# Patient Record
Sex: Male | Born: 1965 | Hispanic: Yes | Marital: Married | Smoking: Never smoker
Health system: Southern US, Community
[De-identification: ages and names within clinical notes are randomized; demographics above are authoritative.]

## PROBLEM LIST (undated history)

## (undated) DIAGNOSIS — E538 Deficiency of other specified B group vitamins: Secondary | ICD-10-CM

## (undated) DIAGNOSIS — E039 Hypothyroidism, unspecified: Secondary | ICD-10-CM

## (undated) DIAGNOSIS — F10939 Alcohol use, unspecified with withdrawal, unspecified: Secondary | ICD-10-CM

## (undated) DIAGNOSIS — I10 Essential (primary) hypertension: Secondary | ICD-10-CM

## (undated) DIAGNOSIS — I639 Cerebral infarction, unspecified: Secondary | ICD-10-CM

## (undated) DIAGNOSIS — F10239 Alcohol dependence with withdrawal, unspecified: Secondary | ICD-10-CM

## (undated) DIAGNOSIS — G8929 Other chronic pain: Secondary | ICD-10-CM

## (undated) DIAGNOSIS — I8390 Asymptomatic varicose veins of unspecified lower extremity: Secondary | ICD-10-CM

## (undated) HISTORY — PX: CHOLECYSTECTOMY: SHX55

## (undated) HISTORY — PX: LEG SURGERY: SHX1003

## (undated) NOTE — *Deleted (*Deleted)
D- Patient alert and oriented. Pt affect/mood is worried and sad. Pt denies SI, HI, AVH, and pain. Pt received a PRN today for complaints of anxiety. Pt also received a PRN dor loose stools. They helped. He is also upset that his family is not returning his phone calls.   A- Scheduled medications administered to patient, per MD orders. Support and encouragement provided.  Routine safety checks conducted every 15 minutes.  Patient informed to notify staff with problems or concerns.  R- No adverse drug reactions noted. Patient contracts for safety at this time. Patient compliant with medications and treatment plan. Patient receptive, calm, and cooperative. Patient interacts well with others on the unit.  Patient remains safe at this time.  Megan Snider RN 

---

## 2007-09-19 ENCOUNTER — Ambulatory Visit: Payer: Self-pay | Admitting: General Practice

## 2013-06-05 ENCOUNTER — Ambulatory Visit: Payer: Self-pay

## 2014-08-11 ENCOUNTER — Inpatient Hospital Stay: Payer: Self-pay | Admitting: Internal Medicine

## 2014-08-11 LAB — URINALYSIS, COMPLETE
Bacteria: NONE SEEN
Blood: NEGATIVE
Glucose,UR: >=500 mg/dL (ref 0–75)
Hyaline Cast: 71
Leukocyte Esterase: NEGATIVE
Nitrite: NEGATIVE
Ph: 7 (ref 4.5–8.0)
Protein: 100
RBC,UR: 2 /HPF (ref 0–5)
Specific Gravity: 1.027 (ref 1.003–1.030)
Squamous Epithelial: 1
WBC UR: 6 /HPF (ref 0–5)

## 2014-08-11 LAB — COMPREHENSIVE METABOLIC PANEL
Albumin: 4.4 g/dL (ref 3.4–5.0)
Alkaline Phosphatase: 59 U/L
Anion Gap: 7 (ref 7–16)
BUN: 13 mg/dL (ref 7–18)
Bilirubin,Total: 1.4 mg/dL — ABNORMAL HIGH (ref 0.2–1.0)
Calcium, Total: 9.2 mg/dL (ref 8.5–10.1)
Chloride: 98 mmol/L (ref 98–107)
Co2: 29 mmol/L (ref 21–32)
Creatinine: 0.98 mg/dL (ref 0.60–1.30)
EGFR (African American): >60
EGFR (Non-African Amer.): >60
Glucose: 178 mg/dL — ABNORMAL HIGH (ref 65–99)
Osmolality: 273 (ref 275–301)
Potassium: 4 mmol/L (ref 3.5–5.1)
SGOT(AST): 67 U/L — ABNORMAL HIGH (ref 15–37)
SGPT (ALT): 49 U/L
Sodium: 134 mmol/L — ABNORMAL LOW (ref 136–145)
Total Protein: 9 g/dL — ABNORMAL HIGH (ref 6.4–8.2)

## 2014-08-11 LAB — CBC
HCT: 42.5 % (ref 40.0–52.0)
HGB: 14 g/dL (ref 13.0–18.0)
MCH: 32 pg (ref 26.0–34.0)
MCHC: 32.9 g/dL (ref 32.0–36.0)
MCV: 97 fL (ref 80–100)
Platelet: 185 10*3/uL (ref 150–440)
RBC: 4.37 10*6/uL — ABNORMAL LOW (ref 4.40–5.90)
RDW: 14.4 % (ref 11.5–14.5)
WBC: 8.8 10*3/uL (ref 3.8–10.6)

## 2014-08-11 LAB — DRUG SCREEN, URINE

## 2014-08-11 LAB — D-DIMER(ARMC): D-Dimer: 287 ng/ml

## 2014-08-11 LAB — TSH: Thyroid Stimulating Horm: 26.4 u[IU]/mL — ABNORMAL HIGH

## 2014-08-11 LAB — ETHANOL: Ethanol: <3 mg/dL

## 2014-08-11 LAB — SALICYLATE LEVEL: Salicylates, Serum: <1.7 mg/dL

## 2014-08-11 LAB — T4, FREE: Free Thyroxine: 0.63 ng/dL — ABNORMAL LOW (ref 0.76–1.46)

## 2014-08-11 LAB — ACETAMINOPHEN LEVEL: Acetaminophen: <2 ug/mL

## 2014-08-12 LAB — CBC WITH DIFFERENTIAL/PLATELET
Basophil #: 0 10*3/uL (ref 0.0–0.1)
Basophil %: 0.7 %
Eosinophil #: 0.1 10*3/uL (ref 0.0–0.7)
Eosinophil %: 1.6 %
HCT: 37.8 % — ABNORMAL LOW (ref 40.0–52.0)
HGB: 12.4 g/dL — ABNORMAL LOW (ref 13.0–18.0)
Lymphocyte #: 0.6 10*3/uL — ABNORMAL LOW (ref 1.0–3.6)
Lymphocyte %: 10.4 %
MCH: 32 pg (ref 26.0–34.0)
MCHC: 32.8 g/dL (ref 32.0–36.0)
MCV: 98 fL (ref 80–100)
Monocyte #: 0.5 x10 3/mm (ref 0.2–1.0)
Monocyte %: 7.9 %
Neutrophil #: 4.6 10*3/uL (ref 1.4–6.5)
Neutrophil %: 79.4 %
Platelet: 159 10*3/uL (ref 150–440)
RBC: 3.87 10*6/uL — ABNORMAL LOW (ref 4.40–5.90)
RDW: 14.4 % (ref 11.5–14.5)
WBC: 5.8 10*3/uL (ref 3.8–10.6)

## 2014-08-12 LAB — COMPREHENSIVE METABOLIC PANEL
Albumin: 3.5 g/dL (ref 3.4–5.0)
Alkaline Phosphatase: 47 U/L
Anion Gap: 8 (ref 7–16)
BUN: 8 mg/dL (ref 7–18)
Bilirubin,Total: 1.6 mg/dL — ABNORMAL HIGH (ref 0.2–1.0)
Calcium, Total: 8.3 mg/dL — ABNORMAL LOW (ref 8.5–10.1)
Chloride: 106 mmol/L (ref 98–107)
Co2: 24 mmol/L (ref 21–32)
Creatinine: 0.55 mg/dL — ABNORMAL LOW (ref 0.60–1.30)
EGFR (African American): >60
EGFR (Non-African Amer.): >60
Glucose: 108 mg/dL — ABNORMAL HIGH (ref 65–99)
Osmolality: 275 (ref 275–301)
Potassium: 3.7 mmol/L (ref 3.5–5.1)
SGOT(AST): 51 U/L — ABNORMAL HIGH (ref 15–37)
SGPT (ALT): 40 U/L
Sodium: 138 mmol/L (ref 136–145)
Total Protein: 7.4 g/dL (ref 6.4–8.2)

## 2014-08-12 LAB — MAGNESIUM: Magnesium: 2.1 mg/dL

## 2014-08-13 LAB — COMPREHENSIVE METABOLIC PANEL
Albumin: 3.1 g/dL — ABNORMAL LOW (ref 3.4–5.0)
Alkaline Phosphatase: 44 U/L — ABNORMAL LOW
Anion Gap: 13 (ref 7–16)
BUN: 7 mg/dL (ref 7–18)
Bilirubin,Total: 1.1 mg/dL — ABNORMAL HIGH (ref 0.2–1.0)
Calcium, Total: 8 mg/dL — ABNORMAL LOW (ref 8.5–10.1)
Chloride: 104 mmol/L (ref 98–107)
Co2: 20 mmol/L — ABNORMAL LOW (ref 21–32)
Creatinine: 0.74 mg/dL (ref 0.60–1.30)
EGFR (African American): >60
EGFR (Non-African Amer.): >60
Glucose: 92 mg/dL (ref 65–99)
Osmolality: 271 (ref 275–301)
Potassium: 3.2 mmol/L — ABNORMAL LOW (ref 3.5–5.1)
SGOT(AST): 39 U/L — ABNORMAL HIGH (ref 15–37)
SGPT (ALT): 34 U/L
Sodium: 137 mmol/L (ref 136–145)
Total Protein: 7.1 g/dL (ref 6.4–8.2)

## 2014-08-13 LAB — CBC WITH DIFFERENTIAL/PLATELET
Basophil #: 0.1 10*3/uL (ref 0.0–0.1)
Basophil %: 0.7 %
Eosinophil #: 0 10*3/uL (ref 0.0–0.7)
Eosinophil %: 0.1 %
HCT: 38.4 % — ABNORMAL LOW (ref 40.0–52.0)
HGB: 12.7 g/dL — ABNORMAL LOW (ref 13.0–18.0)
Lymphocyte #: 0.7 10*3/uL — ABNORMAL LOW (ref 1.0–3.6)
Lymphocyte %: 9.1 %
MCH: 31.9 pg (ref 26.0–34.0)
MCHC: 33.1 g/dL (ref 32.0–36.0)
MCV: 96 fL (ref 80–100)
Monocyte #: 0.4 x10 3/mm (ref 0.2–1.0)
Monocyte %: 5.4 %
Neutrophil #: 6.8 10*3/uL — ABNORMAL HIGH (ref 1.4–6.5)
Neutrophil %: 84.7 %
Platelet: 173 10*3/uL (ref 150–440)
RBC: 3.99 10*6/uL — ABNORMAL LOW (ref 4.40–5.90)
RDW: 14 % (ref 11.5–14.5)
WBC: 8.1 10*3/uL (ref 3.8–10.6)

## 2014-08-14 LAB — BASIC METABOLIC PANEL
Anion Gap: 10 (ref 7–16)
BUN: 8 mg/dL (ref 7–18)
Calcium, Total: 8 mg/dL — ABNORMAL LOW (ref 8.5–10.1)
Chloride: 108 mmol/L — ABNORMAL HIGH (ref 98–107)
Co2: 21 mmol/L (ref 21–32)
Creatinine: 0.7 mg/dL (ref 0.60–1.30)
EGFR (African American): >60
EGFR (Non-African Amer.): >60
Glucose: 89 mg/dL (ref 65–99)
Osmolality: 275 (ref 275–301)
Potassium: 3.2 mmol/L — ABNORMAL LOW (ref 3.5–5.1)
Sodium: 139 mmol/L (ref 136–145)

## 2014-08-14 LAB — CBC WITH DIFFERENTIAL/PLATELET
Basophil #: 0 10*3/uL (ref 0.0–0.1)
Basophil %: 0.5 %
Eosinophil #: 0 10*3/uL (ref 0.0–0.7)
Eosinophil %: 0.1 %
HCT: 35 % — ABNORMAL LOW (ref 40.0–52.0)
HGB: 11.7 g/dL — ABNORMAL LOW (ref 13.0–18.0)
Lymphocyte #: 1.2 10*3/uL (ref 1.0–3.6)
Lymphocyte %: 13.5 %
MCH: 32.5 pg (ref 26.0–34.0)
MCHC: 33.5 g/dL (ref 32.0–36.0)
MCV: 97 fL (ref 80–100)
Monocyte #: 0.9 x10 3/mm (ref 0.2–1.0)
Monocyte %: 9.8 %
Neutrophil #: 7 10*3/uL — ABNORMAL HIGH (ref 1.4–6.5)
Neutrophil %: 76.1 %
Platelet: 158 10*3/uL (ref 150–440)
RBC: 3.61 10*6/uL — ABNORMAL LOW (ref 4.40–5.90)
RDW: 14.1 % (ref 11.5–14.5)
WBC: 9.2 10*3/uL (ref 3.8–10.6)

## 2014-08-14 LAB — HEPATIC FUNCTION PANEL A (ARMC)
Albumin: 2.6 g/dL — ABNORMAL LOW (ref 3.4–5.0)
Alkaline Phosphatase: 37 U/L — ABNORMAL LOW
Bilirubin, Direct: 0.2 mg/dL (ref 0.0–0.2)
Bilirubin,Total: 0.9 mg/dL (ref 0.2–1.0)
SGOT(AST): 25 U/L (ref 15–37)
SGPT (ALT): 25 U/L
Total Protein: 6.4 g/dL (ref 6.4–8.2)

## 2014-08-15 LAB — CBC WITH DIFFERENTIAL/PLATELET
Basophil #: 0 10*3/uL (ref 0.0–0.1)
Basophil %: 0.4 %
Eosinophil #: 0 10*3/uL (ref 0.0–0.7)
Eosinophil %: 0.4 %
HCT: 34.1 % — ABNORMAL LOW (ref 40.0–52.0)
HGB: 11.3 g/dL — ABNORMAL LOW (ref 13.0–18.0)
Lymphocyte #: 0.9 10*3/uL — ABNORMAL LOW (ref 1.0–3.6)
Lymphocyte %: 12.7 %
MCH: 32.3 pg (ref 26.0–34.0)
MCHC: 33.2 g/dL (ref 32.0–36.0)
MCV: 97 fL (ref 80–100)
Monocyte #: 1.1 x10 3/mm — ABNORMAL HIGH (ref 0.2–1.0)
Monocyte %: 14.7 %
Neutrophil #: 5.2 10*3/uL (ref 1.4–6.5)
Neutrophil %: 71.8 %
Platelet: 204 10*3/uL (ref 150–440)
RBC: 3.5 10*6/uL — ABNORMAL LOW (ref 4.40–5.90)
RDW: 14.4 % (ref 11.5–14.5)
WBC: 7.2 10*3/uL (ref 3.8–10.6)

## 2014-08-15 LAB — BASIC METABOLIC PANEL
Anion Gap: 10 (ref 7–16)
BUN: 4 mg/dL — ABNORMAL LOW (ref 7–18)
Calcium, Total: 8.5 mg/dL (ref 8.5–10.1)
Chloride: 107 mmol/L (ref 98–107)
Co2: 24 mmol/L (ref 21–32)
Creatinine: 0.64 mg/dL (ref 0.60–1.30)
EGFR (African American): >60
EGFR (Non-African Amer.): >60
Glucose: 95 mg/dL (ref 65–99)
Osmolality: 278 (ref 275–301)
Potassium: 3.3 mmol/L — ABNORMAL LOW (ref 3.5–5.1)
Sodium: 141 mmol/L (ref 136–145)

## 2014-08-16 LAB — CBC WITH DIFFERENTIAL/PLATELET
Basophil #: 0 10*3/uL (ref 0.0–0.1)
Basophil %: 0.8 %
Eosinophil #: 0.1 10*3/uL (ref 0.0–0.7)
Eosinophil %: 0.9 %
HCT: 32.8 % — ABNORMAL LOW (ref 40.0–52.0)
HGB: 10.9 g/dL — ABNORMAL LOW (ref 13.0–18.0)
Lymphocyte #: 1 10*3/uL (ref 1.0–3.6)
Lymphocyte %: 17.6 %
MCH: 32.4 pg (ref 26.0–34.0)
MCHC: 33.3 g/dL (ref 32.0–36.0)
MCV: 97 fL (ref 80–100)
Monocyte #: 1.1 x10 3/mm — ABNORMAL HIGH (ref 0.2–1.0)
Monocyte %: 19.2 %
Neutrophil #: 3.5 10*3/uL (ref 1.4–6.5)
Neutrophil %: 61.5 %
Platelet: 248 10*3/uL (ref 150–440)
RBC: 3.37 10*6/uL — ABNORMAL LOW (ref 4.40–5.90)
RDW: 14 % (ref 11.5–14.5)
WBC: 5.7 10*3/uL (ref 3.8–10.6)

## 2014-08-16 LAB — BASIC METABOLIC PANEL
Anion Gap: 8 (ref 7–16)
BUN: 3 mg/dL — ABNORMAL LOW (ref 7–18)
Calcium, Total: 8.9 mg/dL (ref 8.5–10.1)
Chloride: 107 mmol/L (ref 98–107)
Co2: 26 mmol/L (ref 21–32)
Creatinine: 0.69 mg/dL (ref 0.60–1.30)
EGFR (African American): >60
EGFR (Non-African Amer.): >60
Glucose: 98 mg/dL (ref 65–99)
Osmolality: 278 (ref 275–301)
Potassium: 3 mmol/L — ABNORMAL LOW (ref 3.5–5.1)
Sodium: 141 mmol/L (ref 136–145)

## 2014-08-16 LAB — MAGNESIUM: Magnesium: 2.2 mg/dL

## 2014-08-17 LAB — CBC WITH DIFFERENTIAL/PLATELET
Basophil #: 0.1 10*3/uL (ref 0.0–0.1)
Basophil %: 1.2 %
Eosinophil #: 0.1 10*3/uL (ref 0.0–0.7)
Eosinophil %: 1.4 %
HCT: 33.8 % — ABNORMAL LOW (ref 40.0–52.0)
HGB: 11.2 g/dL — ABNORMAL LOW (ref 13.0–18.0)
Lymphocyte #: 1.2 10*3/uL (ref 1.0–3.6)
Lymphocyte %: 21.3 %
MCH: 31.7 pg (ref 26.0–34.0)
MCHC: 33 g/dL (ref 32.0–36.0)
MCV: 96 fL (ref 80–100)
Monocyte #: 1.1 x10 3/mm — ABNORMAL HIGH (ref 0.2–1.0)
Monocyte %: 18 %
Neutrophil #: 3.4 10*3/uL (ref 1.4–6.5)
Neutrophil %: 58.1 %
Platelet: 320 10*3/uL (ref 150–440)
RBC: 3.52 10*6/uL — ABNORMAL LOW (ref 4.40–5.90)
RDW: 14.2 % (ref 11.5–14.5)
WBC: 5.8 10*3/uL (ref 3.8–10.6)

## 2014-08-17 LAB — BASIC METABOLIC PANEL
Anion Gap: 9 (ref 7–16)
BUN: 3 mg/dL — ABNORMAL LOW (ref 7–18)
Calcium, Total: 9 mg/dL (ref 8.5–10.1)
Chloride: 106 mmol/L (ref 98–107)
Co2: 26 mmol/L (ref 21–32)
Creatinine: 0.69 mg/dL (ref 0.60–1.30)
EGFR (African American): >60
EGFR (Non-African Amer.): >60
Glucose: 89 mg/dL (ref 65–99)
Osmolality: 277 (ref 275–301)
Potassium: 3.6 mmol/L (ref 3.5–5.1)
Sodium: 141 mmol/L (ref 136–145)

## 2014-11-21 NOTE — H&P (Signed)
PATIENT NAME:  Ivan Hamilton, Ivan Hamilton MR#:  [id number] DATE OF BIRTH:  10-01-1965  ADMITTING PHYSICIAN: Gladstone Lighter, MD  PRIMARY CARE PHYSICIAN: Tracie Harrier, MD  CHIEF COMPLAINT: Alcohol withdrawal.   HISTORY OF PRESENT ILLNESS: Ivan Hamilton is a 49 year old Hispanic male with past medical history significant for hypothyroidism, alcohol abuse, presents to the hospital secondary to not drinking for 4 days and having significant confusion and agitation at home. The patient is very confused and in withdrawals at this time and most of the history is obtained from wife at bedside. According to the wife, the patient drinks more than a half bottle of vodka every day. He tried to quit in the past once, but it did not help him.  He is usually afraid to get treatment for his withdrawal because he is worried that his work people would know. At work, he is very sober.  He helps in making mattresses and decided to stop drinking on Sunday, 4 days ago from now, and has been confused, but tremors getting worse, agitation getting worse over the last couple of days, so he is brought to the ER. The patient is tachycardic, hypertensive at this time. Very agitated, restless, in spite of receiving Valium, Ativan, multiple doses. So, he is being admitted for alcohol withdrawals.    PAST MEDICAL HISTORY:   1.  Hypothyroidism.  2.  Alcohol abuse.   PAST SURGICAL HISTORY: None.   ALLERGIES TO MEDICATIONS: No known drug allergies.   CURRENT HOME MEDICATIONS: Synthroid 100 mcg p.o. daily.   SOCIAL HISTORY: Lives at home with his wife. Denies smoking. Works in [employer]. Drinks more than a half bottle of vodka every day.   FAMILY HISTORY: Dad died in a homicide and mom only has thyroid issues.   REVIEW OF SYSTEMS: Unable to be obtained secondary to the patient's mental status.   PHYSICAL EXAMINATION: VITAL SIGNS: Temperature 98.9 degrees Fahrenheit, pulse 96, respirations 18, blood pressure 149/92, pulse  oximetry 98% on room air.  GENERAL: Well-built, well-nourished male in bed, very confused, trying to get out of bed.  HEENT: Normocephalic, atraumatic. Pupils equal, round, reacting to light. Anicteric sclerae. Extraocular movements intact. Oropharynx clear without erythema, mass, or exudates.  NECK: Supple. No thyromegaly, JVD, or carotid bruits. No lymphadenopathy.  LUNGS: Moving air bilaterally, no wheezes or crackles. No use of accessory muscles for breathing. CARDIOVASCULAR: S1, S2, regular rate and rhythm. No murmurs, rubs, or gallops.  ABDOMEN: Soft, nontender, nondistended. No hepatosplenomegaly. Normal bowel sounds.  EXTREMITIES: No pedal edema. No clubbing or cyanosis, normal dorsalis pedis pulses palpable bilaterally.  SKIN: No acne, rash, or lesions.  LYMPHATIC: No cervical or inguinal lymphadenopathy.  NEUROLOGIC: Unable to assess because patient not cooperating, able to mover all 4 extremities and no facial deficits noted on exam.  PSYCHOLOGICAL: He is alert, not oriented.   LABORATORY DATA: WBC is 8.8, hemoglobin 14.0, hematocrit 42.5, platelet count 185,000.  Sodium 134, potassium 4.0, chloride 98, bicarbonate 29, BUN 13, creatinine 0.98, glucose 178, calcium of 9.2.  ALT 49, AST 67, alkaline phosphatase 59, total bilirubin 1.4, albumin of 4.4. Acetaminophen level is negative.   IMAGING:  Chest x-ray showing clear lung fields. CT of the head showing no acute intracranial abnormality.  Serum ethanol level is negative. Urine toxicology screen is negative. TSH is elevated at 26.4, free T4 is low at 0.63. D-dimer is 287, within normal limits.   EKG showing normal sinus rhythm, heart rate of 88.   ASSESSMENT AND PLAN: A 49 year old  male with alcoholic dependency, stopped drinking 4 days ago, going through withdrawals.  1.  Severe alcohol withdrawal. With delirium tremens. Admit for CIWA protocol. Already received multiple doses of Ativan and Valium and not getting controlled, still  very agitated.  Will admit to critical care unit for precedex drip.  monitor intravenous fluids. Psychiatry has been consulted.  If worsening withdrawals- then will need intubation and propofol 2.  Hypothyroidism, noncompliant with his medication. Restart his Synthroid. If he cannot take by mouth, will do intravenous.  3.  Deep vein thrombosis prophylaxis.   CODE STATUS: Full code.   CRITICAL CARE TIME SPENT ON ADMISSION: 60 minutes.   ____________________________ Gladstone Lighter, MD rk:LT D: 08/11/2014 17:19:12 ET T: 08/11/2014 17:58:48 ET JOB#: [id number]  cc: Gladstone Lighter, MD, <Dictator> Gladstone Lighter MD ELECTRONICALLY SIGNED 08/12/2014 11:07
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
## 2014-11-21 NOTE — Discharge Summary (Signed)
Ivan Hamilton NAME:  Ivan Hamilton, Ivan Hamilton MR#:  [id number] DATE OF BIRTH:  1965-10-08  DIAGNOSES AT TIME OF DISCHARGE: 1.  Delirium tremens secondary to acute alcoholic withdrawal.  2.  Hypothyroidism.  3.  Generalized weakness and confusion.   HISTORY OF PRESENT ILLNESS: Ivan Hamilton is a 48 year old Hispanic male with a past medical history significant for hypothyroidism, alcohol abuse, presents to the hospital after Ivan Hamilton quit drinking approximately 4 days prior to admission and subsequently became confused and agitated. History was mainly obtained from Ivan Hamilton wife and reportedly Ivan Hamilton drinks more than 1/2 bottle of vodka every day. Ivan Hamilton tried to quit in the past on 1 occasion, but was unable to, and Ivan Hamilton reportedly had become more confused with increased tremors and agitation, and when Ivan Hamilton was brought to the ER, was noted to be tachycardic, hypertensive, agitated, restless, and received multiple doses of Valium and Ativan. Ivan Hamilton was initially admitted to the CCU and subsequently transferred to the floor. Please see H and P for full details.   PHYSICAL EXAMINATION: VITAL SIGNS: Temperature was 98.9, pulse was 96, respirations 18, blood pressure 149/92, pulse oximetry 98% on room air.  GENERAL: Ivan Hamilton was agitated and confused and trying to get out of bed.  HEENT: NCAT.  NECK: Supple. No thyromegaly.  LUNGS: Clear.  HEART: S1, S2.  ABDOMEN: Soft, nontender.  EXTREMITIES: No edema.   HOSPITAL COURSE The Ivan Hamilton was admitted to [facility] initially to the CCU and subsequently transferred to the floor. Ivan Hamilton was also seen in consultation by Dr. Weber Cooks, psychiatrist, who felt that Ivan Hamilton may have an element of PTSD. Urine toxicology screen was negative. TSH was elevated at 26.3 with a free T4 that was low at 0.63, and D-dimer 287. EKG showed normal sinus rhythm with a heart rate of 88. Liver function tests were abnormal with ALT of 49, AST of 67, alkaline phosphatase 59, total bilirubin of 1.4, and albumin of 4.4.   The Ivan Hamilton  was placed on CIWA protocol and Ivan Hamilton mental status gradually improved. Ivan Hamilton was also seen by physical therapist. Ivan Hamilton liver function tests did improve and Ivan Hamilton also underwent an ultrasound of Ivan Hamilton abdomen, which showed normal-appearing liver, no acute abnormalities identified. Sludge was noted in the gallbladder with no sonographic evidence of acute cholecystitis. The Ivan Hamilton was seen by physical therapy and was ambulated and although Ivan Hamilton continued to remain somewhat weak, was able to ambulate without assistance. We felt Ivan Hamilton would benefit from outpatient physical therapy and Ivan Hamilton was discharged in stable condition on the following medications: Ativan 1 mg p.o. b.i.d. p.r.n., levothyroxine 100 mcg once a day.   The Ivan Hamilton was strongly advised to quit drinking completely and advised to follow up with me, Dr. Ginette Pitman, in 1-2 weeks' time. The Ivan Hamilton is stable at the time of discharge.   TOTAL TIME SPENT IN DISCHARGING THE Ivan Hamilton: 35 minutes.    ____________________________ Tracie Harrier, MD vh:LT D: 08/18/2014 13:26:44 ET T: 08/18/2014 20:41:37 ET JOB#: [id number]  cc: Tracie Harrier, MD, <Dictator> Tracie Harrier MD ELECTRONICALLY SIGNED 09/15/2014 13:16

## 2014-11-21 NOTE — Consult Note (Signed)
Psychiatry: Follow-up for this patient having alcohol withdrawal delirium.  Patient seen today and also spoke with his family.  Patient states he is feeling better.  He has been able to eat liquid diet today.  Family reports that he tried to stand up but got very dizzy and weak.  Vital signs staying fairly stable.  He did still require a couple doses of Ativan earlier this afternoon.  Does not appear to be having hallucinations or active delirium. reports a small amount of abdominal pain and a little dizziness.  Mood is good denies suicidal ideation he denies hallucinations. withdrawal proceeding well.  Delirium seems under good control.  Anticipate 2 or 3 more days likely before he will have the strength to be ready for discharge.  We will work on trying to get appropriate referral in place for outpatient substance abuse treatment.  Psychoeducation completed with family and patient today.  We will continue to follow up. Alcohol withdrawal delirium, alcohol abuse  Electronic Signatures: Gonzella Lex (MD)  (Signed on 23-Jan-16 16:42)  Authored  Last Updated: 23-Jan-16 16:42 by Gonzella Lex (MD)

## 2014-11-21 NOTE — Consult Note (Signed)
Psychiatry: PAtient seen. Today family report he is more confused and nursing confirms that. Affect smiling and upbeat. PAtient confused and disoriented and hallucinating. Not violent and does have some insight and intermittant understanding of reason for hospitalization.  plan with family, patient and nursing. Just needs more ativan and supportive management. Urged family to keep lights on in the day and keep him oriented. Added po restoril 30mg  at night to help sleep. Will continue to follow. Delirium due to alcohol withdrawl  Electronic Signatures: Gonzella Lex (MD)  (Signed on 24-Jan-16 17:52)  Authored  Last Updated: 24-Jan-16 17:52 by Gonzella Lex (MD)

## 2014-11-21 NOTE — Consult Note (Signed)
Psychiatry: Follow-up for this patient having DTs.  Patient says he is feeling better today.  He is not aware of having any confusion.  Wife agrees that he is better than he has been but has still noticed episodes of confusion and disorientation during the day.  Patient has been able to get up and ambulate around the unit although he remains weak and a little off balance.  Does not complain of any current pain.  Has been able to eat much better. review of systems he is not aware of any confusion.  States his mood is better.  A little weak and dizzy but feeling more healthy. is awake neatly groomed and alert.  Appropriate interaction.  Normal speech.  Upbeat affect.  Mood stated as good.  Still little bit scattered and slow in his thinking but not to a dramatic degree. has received 1 IV dose of lorazepam earlier this day. is concerned about his being discharged too early which I agree is an appropriate concern.  On the other hand I understand that medically he is stabilizing well.  My suggestion would be that a referral be made to try to get him to inpatient rehabilitation when he leaves here.  The patient has private insurance which puts him in a different category than many of the substance abuse clients we see.  Some options are available to him that are not available to others whereas there are other options that do not pertain to private insurance. work consult really requested.  I think he should be referred to rehabilitation options such as perhaps [facility].  Patient had requested this previously.  This would be a good transition after leaving the inpatient medical ward. unchanged delirium related to alcohol withdrawal.  Electronic Signatures: Gonzella Lex (MD)  (Signed on 26-Jan-16 21:11)  Authored  Last Updated: 26-Jan-16 21:11 by Gonzella Lex (MD)

## 2014-11-21 NOTE — Consult Note (Signed)
Psychiatry: Follow-up for this patient having delirium tremens.  On evaluation today he was awake and interactive.  He knew where he was and could give some description of the events leading up to hospitalization.  Patient said he was still feeling a little sick.  Having some pain in his abdomen. mental status he was awake eye contact intermittent.  Still jittery and shaky.  Speech quiet and decreased in amount affect flat.  Thoughts are somewhat scattered.  The stories he tells ramble.  Wife tells me that he is still having visual hallucinations.  Denies any acute suicidality. has come off of Precedex drip.  Currently being maintained on benzodiazepines.  Still tachycardic but other vital signs stable. probably will be able to transition to the floor within the next day or so.  Will require continued monitoring and liberal doses of benzodiazepines to prevent to return of full-blown delirium.  I will be out of the hospital tomorrow.  Please call Dr. Dillard Cannon if he needs to be seen tomorrow.  Otherwise I will follow-up over the weekend.  No other change to treatment plan. delirium due to alcohol withdrawal, alcohol abuse  Electronic Signatures: Gonzella Lex (MD)  (Signed on 21-Jan-16 17:44)  Authored  Last Updated: 21-Jan-16 17:44 by Gonzella Lex (MD)

## 2015-03-31 DIAGNOSIS — G8929 Other chronic pain: Secondary | ICD-10-CM | POA: Insufficient documentation

## 2015-03-31 DIAGNOSIS — R945 Abnormal results of liver function studies: Secondary | ICD-10-CM | POA: Insufficient documentation

## 2015-03-31 DIAGNOSIS — R7989 Other specified abnormal findings of blood chemistry: Secondary | ICD-10-CM | POA: Insufficient documentation

## 2015-03-31 DIAGNOSIS — F4489 Other dissociative and conversion disorders: Secondary | ICD-10-CM | POA: Insufficient documentation

## 2015-03-31 DIAGNOSIS — M79605 Pain in left leg: Secondary | ICD-10-CM

## 2015-04-25 DIAGNOSIS — I8390 Asymptomatic varicose veins of unspecified lower extremity: Secondary | ICD-10-CM | POA: Insufficient documentation

## 2015-04-25 DIAGNOSIS — I8310 Varicose veins of unspecified lower extremity with inflammation: Secondary | ICD-10-CM | POA: Insufficient documentation

## 2015-05-16 ENCOUNTER — Emergency Department
Admission: EM | Admit: 2015-05-16 | Discharge: 2015-05-18 | Disposition: A | Discharge status: Other Acute Inpatient Hospital | Payer: Commercial Managed Care - PPO | Attending: Emergency Medicine | Admitting: Emergency Medicine

## 2015-05-16 ENCOUNTER — Encounter: Payer: Self-pay | Admitting: *Deleted

## 2015-05-16 DIAGNOSIS — Z046 Encounter for general psychiatric examination, requested by authority: Secondary | ICD-10-CM | POA: Diagnosis present

## 2015-05-16 DIAGNOSIS — F10120 Alcohol abuse with intoxication, uncomplicated: Secondary | ICD-10-CM | POA: Insufficient documentation

## 2015-05-16 DIAGNOSIS — F10920 Alcohol use, unspecified with intoxication, uncomplicated: Secondary | ICD-10-CM

## 2015-05-16 LAB — COMPREHENSIVE METABOLIC PANEL
ALT: 48 U/L (ref 17–63)
AST: 76 U/L — ABNORMAL HIGH (ref 15–41)
Albumin: 4.9 g/dL (ref 3.5–5.0)
Alkaline Phosphatase: 57 U/L (ref 38–126)
Anion gap: 16 — ABNORMAL HIGH (ref 5–15)
BUN: 11 mg/dL (ref 6–20)
CO2: 26 mmol/L (ref 22–32)
Calcium: 9.3 mg/dL (ref 8.9–10.3)
Chloride: 95 mmol/L — ABNORMAL LOW (ref 101–111)
Creatinine, Ser: 0.8 mg/dL (ref 0.61–1.24)
GFR calc Af Amer: >60 mL/min (ref >60)
GFR calc non Af Amer: >60 mL/min (ref >60)
Glucose, Bld: 125 mg/dL — ABNORMAL HIGH (ref 65–99)
Potassium: 4 mmol/L (ref 3.5–5.1)
Sodium: 137 mmol/L (ref 135–145)
Total Bilirubin: 1.5 mg/dL — ABNORMAL HIGH (ref 0.3–1.2)
Total Protein: 8.6 g/dL — ABNORMAL HIGH (ref 6.5–8.1)

## 2015-05-16 LAB — ACETAMINOPHEN LEVEL: Acetaminophen (Tylenol), Serum: <10 ug/mL — ABNORMAL LOW (ref 10–30)

## 2015-05-16 LAB — CBC
HCT: 40.6 % (ref 40.0–52.0)
Hemoglobin: 13.6 g/dL (ref 13.0–18.0)
MCH: 31.3 pg (ref 26.0–34.0)
MCHC: 33.6 g/dL (ref 32.0–36.0)
MCV: 93.3 fL (ref 80.0–100.0)
Platelets: 130 10*3/uL — ABNORMAL LOW (ref 150–440)
RBC: 4.35 MIL/uL — ABNORMAL LOW (ref 4.40–5.90)
RDW: 13.7 % (ref 11.5–14.5)
WBC: 7.2 10*3/uL (ref 3.8–10.6)

## 2015-05-16 LAB — ETHANOL: Alcohol, Ethyl (B): 380 mg/dL (ref <5)

## 2015-05-16 LAB — SALICYLATE LEVEL: Salicylate Lvl: <4 mg/dL (ref 2.8–30.0)
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
## 2015-05-17 DIAGNOSIS — F1010 Alcohol abuse, uncomplicated: Secondary | ICD-10-CM

## 2015-05-17 DIAGNOSIS — G312 Degeneration of nervous system due to alcohol: Secondary | ICD-10-CM | POA: Insufficient documentation

## 2015-05-17 DIAGNOSIS — G629 Polyneuropathy, unspecified: Secondary | ICD-10-CM | POA: Insufficient documentation

## 2015-05-17 LAB — URINE DRUG SCREEN, QUALITATIVE (ARMC ONLY)
Amphetamines, Ur Screen: NOT DETECTED
Barbiturates, Ur Screen: NOT DETECTED
Benzodiazepine, Ur Scrn: NOT DETECTED
Cannabinoid 50 Ng, Ur ~~LOC~~: NOT DETECTED
Cocaine Metabolite,Ur ~~LOC~~: NOT DETECTED
MDMA (Ecstasy)Ur Screen: NOT DETECTED
Methadone Scn, Ur: NOT DETECTED
Opiate, Ur Screen: NOT DETECTED
Phencyclidine (PCP) Ur S: NOT DETECTED
Tricyclic, Ur Screen: NOT DETECTED

## 2015-05-17 MED ORDER — LORAZEPAM 2 MG PO TABS
0.0000 mg | ORAL_TABLET | Freq: Four times a day (QID) | ORAL | Status: DC
  Administered 2015-05-17 (×2): 2 mg via ORAL
  Administered 2015-05-18: 1 mg via ORAL
  Filled 2015-05-17 (×2): qty 1

## 2015-05-17 MED ORDER — LORAZEPAM 2 MG/ML IJ SOLN: 0.0000 mg | Freq: Four times a day (QID) | INTRAMUSCULAR | Status: DC

## 2015-05-17 MED ORDER — LORAZEPAM 2 MG PO TABS: 0.0000 mg | ORAL_TABLET | Freq: Two times a day (BID) | ORAL | Status: DC

## 2015-05-17 MED ORDER — ACETAMINOPHEN 325 MG PO TABS
650.0000 mg | ORAL_TABLET | Freq: Once | ORAL | Status: AC
  Administered 2015-05-17: 650 mg via ORAL

## 2015-05-17 MED ORDER — IBUPROFEN 600 MG PO TABS
600.0000 mg | ORAL_TABLET | Freq: Once | ORAL | Status: AC
  Administered 2015-05-17: 600 mg via ORAL
  Filled 2015-05-17: qty 1

## 2015-05-17 MED ORDER — VITAMIN B-1 100 MG PO TABS
100.0000 mg | ORAL_TABLET | Freq: Every day | ORAL | Status: DC
  Administered 2015-05-17 – 2015-05-18 (×2): 100 mg via ORAL
  Filled 2015-05-17 (×2): qty 1

## 2015-05-17 MED ORDER — DIAZEPAM 5 MG PO TABS
10.0000 mg | ORAL_TABLET | Freq: Once | ORAL | Status: AC
  Administered 2015-05-17: 10 mg via ORAL
  Filled 2015-05-17: qty 2

## 2015-05-17 MED ORDER — ACETAMINOPHEN 325 MG PO TABS
ORAL_TABLET | ORAL | Status: AC
  Filled 2015-05-17: qty 2

## 2015-05-17 MED ORDER — LORAZEPAM 2 MG/ML IJ SOLN: 0.0000 mg | Freq: Two times a day (BID) | INTRAMUSCULAR | Status: DC

## 2015-05-17 MED ORDER — THIAMINE HCL 100 MG/ML IJ SOLN: 100.0000 mg | Freq: Every day | INTRAMUSCULAR | Status: DC

## 2015-05-17 NOTE — ED Notes (Addendum)
Pt. Noted in room awake and stating, "I go home today?" CIWA @ 0630= 2. No complaints or concerns voiced. No distress or abnormal behavior noted. Will continue to monitor with security cameras. Q 15 minute rounds continue.

## 2015-05-17 NOTE — ED Notes (Signed)

## 2015-05-17 NOTE — ED Notes (Signed)
Meal given. Patient resting quietly in room. No noted distress or abnormal behaviors noted. Will continue 15 minute checks and observation by security camera for safety.

## 2015-05-17 NOTE — ED Notes (Signed)
Pt. Noted in room resting quietly;. No complaints or concerns voiced. No distress or abnormal behavior noted. Will continue to monitor with security cameras. Q 15 minute rounds continue. 

## 2015-05-17 NOTE — ED Notes (Signed)
Patient resting quietly in room. No noted distress or abnormal behaviors noted. Will continue 15 minute checks and observation by security camera for safety. 

## 2015-05-17 NOTE — ED Notes (Signed)
Report received from [facility], RN. Pt. Alert and oriented in no distress denies SI, HI, AVH and pain.  Pt. Instructed to come to me with problems or concerns.Will continue to monitor for safety via security cameras and Q 15 minute checks.

## 2015-05-17 NOTE — ED Notes (Signed)
Pt. Noted in  room watching the tv.;. No complaints or concerns voiced. No distress or abnormal behavior noted. Will continue to monitor with security cameras. Q 15 minute rounds continue. 

## 2015-05-17 NOTE — ED Notes (Signed)
Pt. Noted in room; then up to the bathroom x 1; No complaints or concerns voiced. No distress or abnormal behavior noted. Will continue to monitor with security cameras. Q 15 minute rounds continue.

## 2015-05-17 NOTE — ED Notes (Signed)
Pt. Noted in room. No complaints or concerns voiced. No distress or abnormal behavior noted. Will continue to monitor with security cameras. Q 15 minute rounds continue. 

## 2015-05-17 NOTE — BH Assessment (Signed)
Assessment Note  Ivan Hamilton is an 49 y.o. male. The following was obtained from Pt chart: According to the involuntary commitment the daughter filed, the patient has been drinking daily, not caring for himself, driving his motor vehicle fall impaired, and stated to the daughter that he is going to drink himself to death.  _______  Pt declined Optometrist. Pt presented as pleasant and cooperative. Pt reported no SI, HI, hallucinations or self-injurious behaviors. Pt reports no previous inpatient admissions. Pt requested to leave ED and maintained that he did not know why his daughter contacted the police. Pt reported that he only drinks on the weekend (1/2 bottle). Writer notes that Pt appeared somewhat intoxicated.  Pt was unable to provide duration. Pt stated that he has an appointment this week to begin OPT.  Diagnosis: Deferred  Past Medical History: History reviewed. No pertinent past medical history.  History reviewed. No pertinent past surgical history.  Family History: No family history on file.  Social History:  reports that he has never smoked. He does not have any smokeless tobacco history on file. He reports that he drinks alcohol. He reports that he does not use illicit drugs.  Additional Social History:  Alcohol / Drug Use Pain Medications: None Reported Prescriptions: None Reported Over the Counter: None Reprted History of alcohol / drug use?: Yes Longest period of sobriety (when/how long): Not Reported Substance #1 Name of Substance 1: Alcohol 1 - Age of First Use: 5 1 - Amount (size/oz): 1/2 bottle 1 - Frequency: "Every Weekend" 1 - Duration: Not Provided 1 - Last Use / Amount: 05/16/2015/ .5 bottle of vodka  CIWA: CIWA-Ar BP: (!) 137/95 mmHg Pulse Rate: (!) 119 Nausea and Vomiting: no nausea and no vomiting Tactile Disturbances: none Tremor: no tremor Auditory Disturbances: not present Paroxysmal Sweats: barely perceptible sweating, palms moist Visual  Disturbances: not present Anxiety: no anxiety, at ease Headache, Fullness in Head: moderate Agitation: normal activity Orientation and Clouding of Sensorium: oriented and can do serial additions CIWA-Ar Total: 4 COWS:    Allergies: No Known Allergies  Home Medications:  (Not in a hospital admission)  OB/GYN Status:  No LMP for male patient.  General Assessment Data Location of Assessment: [facility] ED TTS Assessment: In system Is this a Tele or Face-to-Face Assessment?: Face-to-Face Is this an Initial Assessment or a Re-assessment for this encounter?: Initial Assessment Marital status: Married [place] name: NA Is patient pregnant?: No Pregnancy Status: No Living Arrangements: Other relatives (Sister) Can pt return to current living arrangement?: Yes Admission Status: Involuntary Is patient capable of signing voluntary admission?: No Referral Source: Other Insurance type: None  Medical Screening Exam [facility] Walk-in ONLY) Medical Exam completed: Yes  Crisis Care Plan Living Arrangements: Other relatives (Sister) Name of Psychiatrist: None Name of Therapist: None- has appointment to begin OPT  Education Status Is patient currently in school?: No Current Grade: NA Highest grade of school patient has completed: Not Reported Name of school: NA Contact person: None Reported  Risk to self with the past 6 months Suicidal Ideation: No Has patient been a risk to self within the past 6 months prior to admission? : No Suicidal Intent: No Has patient had any suicidal intent within the past 6 months prior to admission? : No Is patient at risk for suicide?: No Suicidal Plan?: No Has patient had any suicidal plan within the past 6 months prior to admission? : No Access to Means: No What has been your use of drugs/alcohol within the last  12 months?: Pt reports alcohol use only on the weekends Previous Attempts/Gestures: No How many times?: 0 Other Self Harm Risks: None  Reported Intentional Self Injurious Behavior: None Family Suicide History: No Recent stressful life event(s):  (NOne reported) Persecutory voices/beliefs?: No Depression: No Substance abuse history and/or treatment for substance abuse?: No Suicide prevention information given to non-admitted patients: Not applicable  Risk to Others within the past 6 months Homicidal Ideation: No Does patient have any lifetime risk of violence toward others beyond the six months prior to admission? : No Thoughts of Harm to Others: No Current Homicidal Intent: No Current Homicidal Plan: No Access to Homicidal Means: No Identified Victim: NA History of harm to others?: No Assessment of Violence: None Noted Violent Behavior Description: NA Does patient have access to weapons?: No Criminal Charges Pending?: No Does patient have a court date: No Is patient on probation?: No  Psychosis Hallucinations: None noted Delusions: None noted  Mental Status Report Appearance/Hygiene: In scrubs, Body odor Eye Contact: Good Motor Activity: Unremarkable Speech: Logical/coherent Level of Consciousness: Alert Mood: Pleasant Affect: Appropriate to circumstance Anxiety Level: None Thought Processes: Coherent, Relevant Judgement: Partial Orientation: Person, Place, Situation Obsessive Compulsive Thoughts/Behaviors: None  Cognitive Functioning Concentration: Good Memory: Recent Intact, Remote Intact IQ: Average Insight: Poor Impulse Control: Poor Appetite: Good Weight Loss: 0 Weight Gain: 0 Sleep: No Change Total Hours of Sleep: 5 Vegetative Symptoms: None  ADLScreening [facility] Assessment Services) Patient's cognitive ability adequate to safely complete daily activities?: Yes Patient able to express need for assistance with ADLs?: Yes Independently performs ADLs?: Yes (appropriate for developmental age)  Prior Inpatient Therapy Prior Inpatient Therapy: No Prior Therapy Dates: NA Prior Therapy  Facilty/Provider(s): NA Reason for Treatment: NA  Prior Outpatient Therapy Prior Outpatient Therapy: No Prior Therapy Dates: NA Prior Therapy Facilty/Provider(s): NA Reason for Treatment: NA Does patient have an ACCT team?: No Does patient have Intensive In-House Services?  : No Does patient have [facility] services? : No Does patient have P4CC services?: No  ADL Screening (condition at time of admission) Patient's cognitive ability adequate to safely complete daily activities?: Yes Is the patient deaf or have difficulty hearing?: No Does the patient have difficulty seeing, even when wearing glasses/contacts?: No Does the patient have difficulty concentrating, remembering, or making decisions?: No Patient able to express need for assistance with ADLs?: Yes Does the patient have difficulty dressing or bathing?: No Independently performs ADLs?: Yes (appropriate for developmental age) Does the patient have difficulty walking or climbing stairs?: No Weakness of Legs: Left Weakness of Arms/Hands: None  Home Assistive Devices/Equipment Home Assistive Devices/Equipment: None  Therapy Consults (therapy consults require a physician order) PT Evaluation Needed: No OT Evalulation Needed: No SLP Evaluation Needed: No Abuse/Neglect Assessment (Assessment to be complete while patient is alone) Physical Abuse: Denies Verbal Abuse: Denies Sexual Abuse: Denies Self-Neglect: Denies Values / Beliefs Cultural Requests During Hospitalization: None Spiritual Requests During Hospitalization: None Consults Spiritual Care Consult Needed: No Social Work Consult Needed: No Regulatory affairs officer (For Healthcare) Does patient have an advance directive?: No Would patient like information on creating an advanced directive?: No - patient declined information    Additional Information 1:1 In Past 12 Months?: No CIRT Risk: No Elopement Risk: No Does patient have medical clearance?: Yes      Disposition:  Disposition Initial Assessment Completed for this Encounter: Yes Disposition of Patient: Referred to (Psych MD Consult)  On Site Evaluation by:   Reviewed with Physician:    Kadelyn Dimascio J Martin[place]  05/17/2015 3:06 AM

## 2015-05-17 NOTE — ED Provider Notes (Signed)
-----------------------------------------   6:33 AM on 05/17/2015 -----------------------------------------   Blood pressure 137/95, pulse 119, temperature 98.6 F (37 C), resp. rate 18, SpO2 97 %.  The patient had no acute events since last update.  Calm and cooperative at this time.  Disposition is pending per Psychiatry/Behavioral Medicine team recommendations.  The patient has been evaluated for withdrawal and his CIWA has been 4 and 2     Loney Hering, MD 05/17/15 [phone]

## 2015-05-17 NOTE — ED Notes (Signed)
Spoke with wife. She is very concerned that Ivan Hamilton is drinking himself to death. According to her, he has been very depressed, not attending to ADLs, expressing suicidal thoughts. He has already been in the ICU for alcohol overdose and has been seen by physicians at [facility] who reported he has liver damage and cerebral atrophy.  Wife does not know what to do and wants him admitted for detox and rehab.  Patient waiting to be seen by MD. TTS aware of family concerns.

## 2015-05-17 NOTE — ED Notes (Signed)
Patient visited by family. Maintained on all safety precautions.

## 2015-05-17 NOTE — Consult Note (Signed)
[place] Psychiatry Consult   Reason for Consult:  Consult for this 49 year old man with a history of alcohol abuse sent here on involuntary commitment filed by his family Referring Physician:  Edd Fabian Patient Identification: Ivan Hamilton MRN:  [id number] Principal Diagnosis: Alcohol abuse Diagnosis:   Patient Active Problem List   Diagnosis Date Noted  . Alcohol abuse [F10.10] 05/17/2015  . 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 male patient admitted with "I've been drinking".  HPI:  Information from the patient and the chart. Patient interviewed. Chart reviewed. I am familiar with this patient from previous hospitalizations as well. Lab studies reviewed. This patient was brought here under involuntary commitment filed by his family that report that he is essentially drinking himself to death alleging possible suicidality as well. The patient admits that he has been drinking on a daily basis probably steadily for at least the last couple months. He says he drinks about a half of a bottle of vodka per day. I should mention that he is probably not the most reliable historian seeming to have a little confusion and memory impairment. Denies however that he was abusing any other drugs. Admits that his mood feels bad when he is drinking heavily. He has not been compliant with prescription medicine when he is drinking. Denies however that he had any suicidal or homicidal thoughts.  Medical history: He looks like he is losing weight and he has some distended blood vessels on his left calf. He tells me he has been to a doctor who told him he was supposed to take medicine for them but he hasn't been doing it.  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 Has been out of work for at least a couple weeks. Living with his wife and 2 daughters. They apparently are  very concerned about his drinking habits.  Substance abuse history: Years long history of heavy drinking. He tells me he's been to a rehabilitation program inpatient once. Possibly to the alcohol and drug abuse treatment center. It sounds like he relapsed pretty much right away. He does have a history of delirium tremens in the past when he has stopped drinking. He denies being aware of that however and denies any history of seizures.  Past Psychiatric History: Denies any history of suicide attempts. No separate mental health history outside of his alcohol abuse.  Risk to Self: Suicidal Ideation: No Suicidal Intent: No Is patient at risk for suicide?: No Suicidal Plan?: No Access to Means: No What has been your use of drugs/alcohol within the last 12 months?: Pt reports alcohol use only on the weekends How many times?: 0 Other Self Harm Risks: None Reported Intentional Self Injurious Behavior: None Risk to Others: Homicidal Ideation: No Thoughts of Harm to Others: No Current Homicidal Intent: No Current Homicidal Plan: No Access to Homicidal Means: No Identified Victim: NA History of harm to others?: No Assessment of Violence: None Noted Violent Behavior Description: NA Does patient have access to weapons?: No Criminal Charges Pending?: No Does patient have a court date: No Prior Inpatient Therapy: Prior Inpatient Therapy: No Prior Therapy Dates: NA Prior Therapy Facilty/Provider(s): NA Reason for Treatment: NA Prior Outpatient Therapy: Prior Outpatient Therapy: No Prior Therapy Dates: NA Prior Therapy Facilty/Provider(s): NA Reason for Treatment: NA Does patient have an ACCT team?: No Does patient have Intensive In-House Services?  :  No Does patient have [facility] services? : No Does patient have P4CC services?: No  Past Medical History: History reviewed. No pertinent past medical history. History reviewed. No pertinent past surgical history. Family History: No family history  on file. Family Psychiatric  History: Father had an alcohol dependence problems well Social History:  History  Alcohol Use  . Yes     History  Drug Use No    Social History   Social History  . Marital Status: Married    Spouse Name: N/A  . Number of Children: N/A  . Years of Education: N/A   Social History Main Topics  . Smoking status: Never Smoker   . Smokeless tobacco: None  . Alcohol Use: Yes  . Drug Use: No  . Sexual Activity: Not Asked   Other Topics Concern  . None   Social History Narrative  . None   Additional Social History:    Pain Medications: None Reported Prescriptions: None Reported Over the Counter: None Reprted History of alcohol / drug use?: Yes Longest period of sobriety (when/how long): Not Reported Name of Substance 1: Alcohol 1 - Age of First Use: 5 1 - Amount (size/oz): 1/2 bottle 1 - Frequency: "Every Weekend" 1 - Duration: Not Provided 1 - Last Use / Amount: 05/16/2015/ .5 bottle of vodka                   Allergies:  No Known Allergies  Labs:  Results for orders placed or performed during the hospital encounter of 05/16/15 (from the past 48 hour(s))  Comprehensive metabolic panel     Status: Abnormal   Collection Time: 05/16/15  9:55 PM  Result Value Ref Range   Sodium 137 135 - 145 mmol/L   Potassium 4.0 3.5 - 5.1 mmol/L   Chloride 95 (L) 101 - 111 mmol/L   CO2 26 22 - 32 mmol/L   Glucose, Bld 125 (H) 65 - 99 mg/dL   BUN 11 6 - 20 mg/dL   Creatinine, Ser 0.80 0.61 - 1.24 mg/dL   Calcium 9.3 8.9 - 10.3 mg/dL   Total Protein 8.6 (H) 6.5 - 8.1 g/dL   Albumin 4.9 3.5 - 5.0 g/dL   AST 76 (H) 15 - 41 U/L   ALT 48 17 - 63 U/L   Alkaline Phosphatase 57 38 - 126 U/L   Total Bilirubin 1.5 (H) 0.3 - 1.2 mg/dL   GFR calc non Af Amer >60 >60 mL/min   GFR calc Af Amer >60 >60 mL/min    Comment: (NOTE) The eGFR has been calculated using the CKD EPI equation. This calculation has not been validated in all clinical  situations. eGFR's persistently <60 mL/min signify possible Chronic Kidney Disease.    Anion gap 16 (H) 5 - 15  Ethanol (ETOH)     Status: Abnormal   Collection Time: 05/16/15  9:55 PM  Result Value Ref Range   Alcohol, Ethyl (B) 380 (HH) <5 mg/dL    Comment: CRITICAL RESULT CALLED TO, READ BACK BY AND VERIFIED WITH  SANDRA WEAVER AT 2251 05/16/15 WDM        LOWEST DETECTABLE LIMIT FOR SERUM ALCOHOL IS 5 mg/dL FOR MEDICAL PURPOSES ONLY   Salicylate level     Status: None   Collection Time: 05/16/15  9:55 PM  Result Value Ref Range   Salicylate Lvl <3.5 2.8 - 30.0 mg/dL  Acetaminophen level     Status: Abnormal   Collection Time: 05/16/15  9:55 PM  Result Value Ref Range   Acetaminophen (Tylenol), Serum <10 (L) 10 - 30 ug/mL    Comment:        THERAPEUTIC CONCENTRATIONS VARY SIGNIFICANTLY. A RANGE OF 10-30 ug/mL MAY BE AN EFFECTIVE CONCENTRATION FOR MANY PATIENTS. HOWEVER, SOME ARE BEST TREATED AT CONCENTRATIONS OUTSIDE THIS RANGE. ACETAMINOPHEN CONCENTRATIONS >150 ug/mL AT 4 HOURS AFTER INGESTION AND >50 ug/mL AT 12 HOURS AFTER INGESTION ARE OFTEN ASSOCIATED WITH TOXIC REACTIONS.   CBC     Status: Abnormal   Collection Time: 05/16/15  9:55 PM  Result Value Ref Range   WBC 7.2 3.8 - 10.6 K/uL   RBC 4.35 (L) 4.40 - 5.90 MIL/uL   Hemoglobin 13.6 13.0 - 18.0 g/dL   HCT 40.6 40.0 - 52.0 %   MCV 93.3 80.0 - 100.0 fL   MCH 31.3 26.0 - 34.0 pg   MCHC 33.6 32.0 - 36.0 g/dL   RDW 13.7 11.5 - 14.5 %   Platelets 130 (L) 150 - 440 K/uL  Urine Drug Screen, Qualitative ([facility] only)     Status: None   Collection Time: 05/16/15  9:55 PM  Result Value Ref Range   Tricyclic, Ur Screen NONE DETECTED NONE DETECTED   Amphetamines, Ur Screen NONE DETECTED NONE DETECTED   MDMA (Ecstasy)Ur Screen NONE DETECTED NONE DETECTED   Cocaine Metabolite,Ur Aristes NONE DETECTED NONE DETECTED   Opiate, Ur Screen NONE DETECTED NONE DETECTED   Phencyclidine (PCP) Ur S NONE DETECTED NONE DETECTED    Cannabinoid 50 Ng, Ur Ferguson NONE DETECTED NONE DETECTED   Barbiturates, Ur Screen NONE DETECTED NONE DETECTED   Benzodiazepine, Ur Scrn NONE DETECTED NONE DETECTED   Methadone Scn, Ur NONE DETECTED NONE DETECTED    Comment: (NOTE) 027  Tricyclics, urine               Cutoff 1000 ng/mL 200  Amphetamines, urine             Cutoff 1000 ng/mL 300  MDMA (Ecstasy), urine           Cutoff 500 ng/mL 400  Cocaine Metabolite, urine       Cutoff 300 ng/mL 500  Opiate, urine                   Cutoff 300 ng/mL 600  Phencyclidine (PCP), urine      Cutoff 25 ng/mL 700  Cannabinoid, urine              Cutoff 50 ng/mL 800  Barbiturates, urine             Cutoff 200 ng/mL 900  Benzodiazepine, urine           Cutoff 200 ng/mL 1000 Methadone, urine                Cutoff 300 ng/mL 1100 1200 The urine drug screen provides only a preliminary, unconfirmed 1300 analytical test result and should not be used for non-medical 1400 purposes. Clinical consideration and professional judgment should 1500 be applied to any positive drug screen result due to possible 1600 interfering substances. A more specific alternate chemical method 1700 must be used in order to obtain a confirmed analytical result.  1800 Gas chromato graphy / mass spectrometry (GC/MS) is the preferred 1900 confirmatory method.     Current Facility-Administered Medications  Medication Dose Route Frequency Provider Last Rate Last Dose  . LORazepam (ATIVAN) injection 0-4 mg  0-4 mg Intravenous 4 times per day Loney Hering, MD  0 mg at 05/17/15 0553  . LORazepam (ATIVAN) injection 0-4 mg  0-4 mg Intravenous Q12H Loney Hering, MD   0 mg at 05/17/15 0553  . LORazepam (ATIVAN) tablet 0-4 mg  0-4 mg Oral 4 times per day Loney Hering, MD   2 mg at 05/17/15 0931  . LORazepam (ATIVAN) tablet 0-4 mg  0-4 mg Oral Q12H Loney Hering, MD   0 mg at 05/17/15 0553  . thiamine (B-1) injection 100 mg  100 mg Intravenous Daily Loney Hering, MD    100 mg at 05/17/15 0944  . thiamine (VITAMIN B-1) tablet 100 mg  100 mg Oral Daily Loney Hering, MD   100 mg at 05/17/15 0932   No current outpatient prescriptions on file.    Musculoskeletal: Strength & Muscle Tone: decreased Gait & Station: unsteady Patient leans: N/A  Psychiatric Specialty Exam: Review of Systems  Constitutional: Positive for malaise/fatigue.  HENT: Negative.   Eyes: Negative.   Respiratory: Negative.   Cardiovascular: Negative.   Gastrointestinal: Negative.   Musculoskeletal: Negative.   Skin: Negative.        Patient has what look like probably distended varicose veins possibly with clots in the calf of his left leg  Neurological: Negative.   Psychiatric/Behavioral: Positive for memory loss and substance abuse. Negative for depression, suicidal ideas and hallucinations. The patient is nervous/anxious and has insomnia.     Blood pressure 117/91, pulse 97, temperature 98.6 F (37 C), resp. rate 18, SpO2 97 %.There is no height or weight on file to calculate BMI.  General Appearance: Disheveled  Eye Sport and exercise psychologist::  Fair  Speech:  Slow  Volume:  Decreased  Mood:  Dysphoric  Affect:  Depressed  Thought Process:  Tangential  Orientation:  Full (Time, Place, and Person)  Thought Content:  Negative  Suicidal Thoughts:  No  Homicidal Thoughts:  No  Memory:  Immediate;   Fair Recent;   Poor Remote;   Poor  Judgement:  Impaired  Insight:  Shallow  Psychomotor Activity:  Decreased  Concentration:  Fair  Recall:  Poor  Fund of Knowledge:Fair  Language: Fair  Akathisia:  No  Handed:  Right  AIMS (if indicated):     Assets:  Desire for Improvement Intimacy Social Support  ADL's:  Intact  Cognition: Impaired,  Mild  Sleep:      Treatment Plan Summary: Daily contact with patient to assess and evaluate symptoms and progress in treatment, Medication management and Plan This 49 year old gentleman with alcohol abuse came to the hospital with a blood  alcohol level well over 300 last night. He has been given some medicine for detox. He is currently capable of giving an interview but his memory is also clearly impaired. Short-term memory has quite a bit of impairment and I think they're signs of confusion. Don't know whether this is going to be constant just during detox.Marland Kitchen He is not reporting any suicidal or homicidal behavior and is not aggressive. His insight is partial. At this point he does not require admission to our psychiatric ward. I have requested the psychiatry staff to look into whether residential treatment services would be willing to accept him. If so we can transfer him there once things are stable. I will get a another blood alcohol level checked. Currently his vital signs are stable no signs of worsening delirium. Counseling completed with education with the patient. Get him into residential treatment services I will discuss this with the emergency room doctor but  I suspect at that point we will look into discharge home. He has been educated about the outpatient resources for substance abuse treatment in the community.  Disposition: Patient does not meet criteria for psychiatric inpatient admission. Refer to IOP. Discussed crisis plan, support from social network, calling 911, coming to the Emergency Department, and calling Suicide Hotline.  Dolorez Jeffrey 05/17/2015 5:03 PM

## 2015-05-17 NOTE — ED Notes (Signed)
Patient reports reduction in withdrawal symptoms.  Maintained on 15 minute checks and observation by security camera for safety.

## 2015-05-17 NOTE — ED Notes (Signed)
Snack and beverage given. 

## 2015-05-17 NOTE — ED Notes (Signed)
Pt. Noted in room with complaints being  Voiced of having a headache and tremors; ER-MD was notified. No distress or abnormal behavior noted. Will continue to monitor with security cameras. Q 15 minute rounds continue.

## 2015-05-17 NOTE — ED Notes (Signed)
CIWA = 12 with elevated BP and pulse. Patient received Ativan per MD order. Will continue to closely monitor.

## 2015-05-17 NOTE — ED Notes (Signed)
Patient asleep in room. No noted distress or abnormal behavior. Will continue 15 minute checks and observation by security cameras for safety. 

## 2015-05-17 NOTE — ED Notes (Signed)
Meal given

## 2015-05-17 NOTE — Progress Notes (Signed)
Per Dr. Weber Cooks request a referral has been completed for the pt and faxed to [facility]. [facility] has called to verify the they  do have bed availability.   05/17/2015 Con Memos, MS, [place], LPCA Therapeutic Triage Specialist

## 2015-05-17 NOTE — ED Notes (Signed)
Report received from LF, RN. Pt. Alert and oriented in no distress denies SI, HI, AVH and pain.  Pt. Instructed to come to me with problems or concerns.Will continue to monitor for safety via security cameras and Q 15 minute checks.

## 2015-05-17 NOTE — ED Notes (Signed)
Patient with s/s ETOH withdrawal. CIWA = 11. Received PRN per MD order. Will continue to closely monitor. Patient is requesting discharge. He was told that we would not discharge a patient in active withdrawal, plus he would need to be able to verbalize ways for him to stop drinking ( outpt, rehab.) His family is concerned that he is going to drink himself to death. Will maintain all safety precautions.

## 2015-05-17 NOTE — ED Notes (Signed)
Pt. To ED-BHU from ED ambulatory without difficulty, to room BHU-2 . Report from RN. Pt. Is alert and oriented, warm and dry in no distress. Pt. Verbalizes having SI with a plan to drink himself to death; denies, HI, and AVH.Pt. Presents with alcohol intoxication; BAC: 380; CIWA=4; Pt. Calm and cooperative. Pt. Made aware of security cameras and Q15 minute rounds. Pt. Encouraged to let Nursing staff know of any concerns or needs.

## 2015-05-18 LAB — ETHANOL: Alcohol, Ethyl (B): <5 mg/dL (ref <5)

## 2015-05-18 MED ORDER — LORAZEPAM 1 MG PO TABS
ORAL_TABLET | ORAL | Status: AC
  Administered 2015-05-18: 1 mg via ORAL
  Filled 2015-05-18: qty 1

## 2015-05-18 NOTE — Consult Note (Signed)
  Psychiatry: Follow-up for this 49 year old man with alcohol abuse. Patient today has no new complaints. He says that he is feeling okay. He is not expressing any confusion or psychotic symptoms. He has been able to eat adequately. Pulse is normal blood pressure very slightly elevated. To my examination no physical agitation. Repeat alcohol level as of last night was 0.  On review of systems patient has no physical complaints. Denies GI complaints. Denies depression denies suicidal thoughts or hallucinations.  This is a 49 year old man with alcohol dependence who was brought in intoxicated. Family had filed commitment paperwork but there was no allegation of suicidality or homicidality. I had requested the patient stay in the emergency room last night and had not discontinued his commitment because we had hoped to be able to transfer him to residential treatment services for rehabilitation. That plan fell through because of a delay in obtaining his follow-up alcohol level as well as his continued involuntary commitment paperwork.  On evaluation today the patient no longer obviously requires detox services. Really unlikely to benefit from transfer to [facility] which remains unlikely and would probably require another full day waiting in the ER. I don't believe he continues to meet commitment criteria. Commitment discontinued. Education provided regarding the risks of continued alcohol use including neurologic and hepato-logic injury as well as the obvious risks to the relationship he has with his daughters and wife. Patient has been strongly strongly encouraged to go to [facility] and intake counselor and engage in substance abuse treatment. Get the alcohol out of his house. Go to 12-step meetings. Patient seems to understand and is agreeable to the plan. Case reviewed with emergency room doctor. We will discontinue the involuntary commitment and discharge him at the discretion of the ER doctor. Alcohol dependence  continues to be an ongoing problem not acutely requiring inpatient treatment alcohol withdrawal appears to be under control and does not at this point require inpatient treatment. Mood appears to be improved.

## 2015-05-18 NOTE — ED Notes (Signed)
Pt. Noted in room resting quietly;. No complaints or concerns voiced. No distress or abnormal behavior noted. Will continue to monitor with security cameras. Q 15 minute rounds continue. 

## 2015-05-18 NOTE — Discharge Instructions (Signed)
Intoxicacin alcohlica (Alcohol Intoxication) La intoxicacin alcohlica ocurre cuando ha bebido la cantidad de alcohol suficiente para afectar su desenvolvimiento. Puede ser leve o muy grave. Beber gran cantidad de alcohol en un corto plazo se denomina borrachera. Puede ser [employer]. Beber alcohol tambin puede ser muy peligroso si toma medicamentos o [place] otras drogas. Algunos de los efectos causados por el alcohol son:  Prdida de la coordinacin.  Cambios en el estado de nimo y la conducta.  Pensamiento confuso.  Dificultad para hablar (arrastrar las palabras).  Devolver la comida (vomitar).  Confusin.  Disminucin de Secretary/administrator.  Sacudidas y temblores (convulsiones).  Prdida de la conciencia. CUIDADOS EN EL HOGAR  No conduzca vehculos despus de beber alcohol.  Beba gran cantidad de lquido para mantener el pis (orina) de tono claro o de color amarillo plido. Evite la cafena.  Slo tome los medicamentos que le haya indicado su mdico. SOLICITE AYUDA SI:  Devuelve (vomita) repetidas veces.  No mejora luego de [employer].  Se intoxica con alcohol con frecuencia. El mdico podr ayudarlo a decidir si debe consultar a un terapeuta especializado en el abuso de sustancias. SOLICITE AYUDA DE INMEDIATO SI:  Siente temblores cuando deja de beber.  Tiene temblores o sacudidas.  Vomita sangre. Puede ser de color rojo brillante o similar a la borra del caf.  Nota sangre en las heces (movimiento intestinal).  Se siente mareado o se desvanece (se desmaya). ASEGRESE DE QUE:   Comprende estas instrucciones.  Controlar su afeccin.  Recibir ayuda de inmediato si no mejora o si empeora.   Esta informacin no tiene Marine scientist el consejo del mdico. Asegrese de hacerle al mdico cualquier pregunta que tenga.   Document Released: 08/11/2010 Document Revised: 03/11/2013 Elsevier Interactive Patient Education 2016 [place].  Intoxicacin alcohlica (Alcohol Intoxication) La intoxicacin alcohlica se produce cuando la cantidad de alcohol que se ha consumido daa la capacidad de funcionamiento mental y fsico. El alcohol deteriora directamente la actividad qumica normal del cerebro. Beber grandes cantidades de alcohol puede conducir a Insurance underwriter funcionamiento mental y en el comportamiento, y puede causar muchos efectos fsicos que pueden ser perjudiciales.  La intoxicacin alcohlica puede variar en gravedad desde leve hasta muy grave. Hay varios factores que pueden afectar el nivel de intoxicacin que se produce, como la edad de la persona, el sexo, el peso, la frecuencia de consumo de alcohol, y la presencia de otras enfermedades mdicas (como diabetes, convulsiones o enfermedades del corazn). Los niveles peligrosos de intoxicacin por alcohol pueden ocurrir [employer] personas beben grandes cantidades de alcohol en un corto periodo de tiempo ([place]). El alcohol tambin puede ser especialmente peligroso cuando se combina con ciertos medicamentos recetados o drogas "recreativas". SIGNOS Y SNTOMAS Algunos de los signos y sntomas comunes de intoxicacin leve por alcohol incluyen:  Prdida de la coordinacin.  Cambios en el estado de nimo y la conducta.  Incapacidad para razonar.  Hablar arrastrando las palabras. A medida que la intoxicacin por alcohol avanza a niveles ms graves, Lucianne Lei a Arts administrator otros signos y sntomas. Estos pueden ser:  Vmitos.  Confusin y alteracin de Sales promotion account executive.  Disminucin de Secretary/administrator.  Convulsiones.  Prdida de la conciencia. DIAGNSTICO  El mdico le har una historia clnica y un examen fsico. Se le preguntar acerca de la cantidad y el tipo de alcohol que ha consumido. Se le realizarn anlisis de sangre para medir la concentracin de alcohol en sangre. En muchos lugares, el  nivel de alcohol en la sangre debe ser inferior a 80 mg / dL  (0,08%) para poder conducir legalmente. Sin embargo, hay muchos efectos peligrosos del alcohol que pueden ocurrir con niveles mucho ms bajos.  [place] con intoxicacin por alcohol a menudo no requieren Clinical research associate. La mayor parte de los efectos de la intoxicacin por alcohol son temporales, y desaparecen a medida que el alcohol abandona el cuerpo de forma natural. El profesional controlar su estado hasta que est lo suficientemente estable como para volver a casa. A veces se administran lquidos por va intravenosa para ayudar a evitar la deshidratacin.  INSTRUCCIONES PARA EL CUIDADO EN EL HOGAR  No conduzca vehculos despus de beber alcohol.  Mantngase hidratado. Beba gran cantidad de lquido para mantener la orina de tono claro o color amarillo plido. Evite la cafena.   Tome slo medicamentos de venta libre o recetados, segn las indicaciones del mdico.  SOLICITE ATENCIN MDICA SI:   Tiene vmitos persistentes.   No mejora luego de [employer].  Se intoxica con alcohol con frecuencia. El mdico podr ayudarlo a decidir si debe consultar a un terapeuta especializado en el abuso de sustancias. SOLICITE ATENCIN MDICA DE INMEDIATO SI:   Se siente vacilante o tembloroso cuando trata de abandonar el hbito.   Comienza a temblar de manera incontrolable (convulsiones).   Vomita sangre. Puede ser sangre de color rojo brillante o similar al sedimento del caf negro.   Lollie Marrow en la materia fecal. Puede ser de color rojo brillante o de aspecto alquitranado, con olor ftido.   Se siente mareado o se desmaya.  ASEGRESE DE QUE:   Comprende estas instrucciones.  Controlar su afeccin.  Recibir ayuda de inmediato si no mejora o si empeora.   Esta informacin no tiene Marine scientist el consejo del mdico. Asegrese de hacerle al mdico cualquier pregunta que tenga.   Document Released: 07/09/2005 Document Revised: 03/11/2013 Elsevier  Interactive Patient Education [employer].

## 2015-05-18 NOTE — BHH Counselor (Signed)
Per request of Psych  MD (Dr. Clapacs), writer provided the pt. with information and instructions on how to access Outpatient Mental Health & Substance Abuse Treatment ([facility]). 

## 2015-05-18 NOTE — ED Notes (Signed)
Pt. Noted in  room watching the tv.;. No complaints or concerns voiced. No distress or abnormal behavior noted. Will continue to monitor with security cameras. Q 15 minute rounds continue. 

## 2015-05-18 NOTE — ED Notes (Signed)
Patient resting quietly in room. No noted distress or abnormal behaviors noted. Will continue 15 minute checks and observation by security camera for safety. 

## 2015-05-18 NOTE — ED Notes (Signed)
Patient asleep in room. No noted distress or abnormal behavior. Will continue 15 minute checks and observation by security cameras for safety. 

## 2015-05-18 NOTE — ED Provider Notes (Addendum)
-----------------------------------------   7:15 AM on 05/18/2015 -----------------------------------------   Blood pressure 135/95, pulse 88, temperature 99.3 F (37.4 C), temperature source Oral, resp. rate 18, SpO2 100 %.  The patient had no acute events since last update.  Calm and cooperative at this time.  Disposition is pending per Psychiatry/Behavioral Medicine team recommendations.     Schuyler Amor, MD 05/18/15 0715  ----------------------------------------- 12:11 PM on 05/18/2015 -----------------------------------------  Seen and evaluated by psychiatry feel he is safe for discharge and will discharge him.  Schuyler Amor, MD 05/18/15 [phone]

## 2015-05-18 NOTE — ED Notes (Signed)
Patient discharged ambulatory to home, accompanied by wife. He denies SI or HI. Discharge instructions reviewed with patient, he verbalizes understanding. Patient received copy of DC plan and all personal belongings.

## 2015-05-18 NOTE — ED Notes (Signed)

## 2015-05-19 ENCOUNTER — Other Ambulatory Visit: Payer: Self-pay | Admitting: Internal Medicine

## 2015-05-19 DIAGNOSIS — M545 Low back pain, unspecified: Secondary | ICD-10-CM | POA: Insufficient documentation

## 2015-05-19 DIAGNOSIS — M5442 Lumbago with sciatica, left side: Secondary | ICD-10-CM

## 2015-05-31 ENCOUNTER — Ambulatory Visit
Admission: RE | Admit: 2015-05-31 | Discharge: 2015-05-31 | Disposition: A | Discharge status: Home / Self Care | Payer: Medicaid Other | Source: Ambulatory Visit | Attending: Internal Medicine | Admitting: Internal Medicine

## 2015-05-31 ENCOUNTER — Other Ambulatory Visit: Payer: Self-pay | Admitting: Internal Medicine

## 2015-05-31 DIAGNOSIS — M5442 Lumbago with sciatica, left side: Secondary | ICD-10-CM

## 2015-05-31 DIAGNOSIS — M795 Residual foreign body in soft tissue: Secondary | ICD-10-CM | POA: Insufficient documentation

## 2015-05-31 DIAGNOSIS — Z01818 Encounter for other preprocedural examination: Secondary | ICD-10-CM | POA: Insufficient documentation

## 2015-06-15 ENCOUNTER — Ambulatory Visit: Payer: Commercial Managed Care - PPO

## 2015-06-23 ENCOUNTER — Ambulatory Visit
Admission: RE | Admit: 2015-06-23 | Discharge: 2015-06-23 | Disposition: A | Discharge status: Home / Self Care | Payer: No Typology Code available for payment source | Source: Ambulatory Visit | Attending: Internal Medicine | Admitting: Internal Medicine

## 2015-06-23 ENCOUNTER — Other Ambulatory Visit: Payer: Self-pay | Admitting: Internal Medicine

## 2015-06-23 DIAGNOSIS — M5442 Lumbago with sciatica, left side: Secondary | ICD-10-CM

## 2015-06-28 ENCOUNTER — Emergency Department
Admission: EM | Admit: 2015-06-28 | Discharge: 2015-06-29 | Disposition: A | Discharge status: Home / Self Care | Payer: Medicaid Other | Attending: Emergency Medicine | Admitting: Emergency Medicine

## 2015-06-28 ENCOUNTER — Encounter: Payer: Self-pay | Admitting: Emergency Medicine

## 2015-06-28 DIAGNOSIS — R4689 Other symptoms and signs involving appearance and behavior: Secondary | ICD-10-CM

## 2015-06-28 DIAGNOSIS — M549 Dorsalgia, unspecified: Secondary | ICD-10-CM | POA: Diagnosis not present

## 2015-06-28 DIAGNOSIS — M79605 Pain in left leg: Secondary | ICD-10-CM | POA: Diagnosis not present

## 2015-06-28 DIAGNOSIS — F10120 Alcohol abuse with intoxication, uncomplicated: Secondary | ICD-10-CM | POA: Insufficient documentation

## 2015-06-28 DIAGNOSIS — Z79899 Other long term (current) drug therapy: Secondary | ICD-10-CM | POA: Insufficient documentation

## 2015-06-28 DIAGNOSIS — F10920 Alcohol use, unspecified with intoxication, uncomplicated: Secondary | ICD-10-CM

## 2015-06-28 DIAGNOSIS — F911 Conduct disorder, childhood-onset type: Secondary | ICD-10-CM | POA: Diagnosis present

## 2015-06-28 LAB — URINE DRUG SCREEN, QUALITATIVE (ARMC ONLY)
Amphetamines, Ur Screen: NOT DETECTED
Barbiturates, Ur Screen: NOT DETECTED
Benzodiazepine, Ur Scrn: NOT DETECTED
Cannabinoid 50 Ng, Ur ~~LOC~~: NOT DETECTED
Cocaine Metabolite,Ur ~~LOC~~: NOT DETECTED
MDMA (Ecstasy)Ur Screen: NOT DETECTED
Methadone Scn, Ur: NOT DETECTED
Opiate, Ur Screen: NOT DETECTED
Phencyclidine (PCP) Ur S: NOT DETECTED
Tricyclic, Ur Screen: NOT DETECTED

## 2015-06-28 LAB — COMPREHENSIVE METABOLIC PANEL
ALT: 43 U/L (ref 17–63)
AST: 28 U/L (ref 15–41)
Albumin: 4.6 g/dL (ref 3.5–5.0)
Alkaline Phosphatase: 47 U/L (ref 38–126)
Anion gap: 6 (ref 5–15)
BUN: 8 mg/dL (ref 6–20)
CO2: 28 mmol/L (ref 22–32)
Calcium: 9.1 mg/dL (ref 8.9–10.3)
Chloride: 111 mmol/L (ref 101–111)
Creatinine, Ser: 0.75 mg/dL (ref 0.61–1.24)
GFR calc Af Amer: >60 mL/min (ref >60)
GFR calc non Af Amer: >60 mL/min (ref >60)
Glucose, Bld: 116 mg/dL — ABNORMAL HIGH (ref 65–99)
Potassium: 3.6 mmol/L (ref 3.5–5.1)
Sodium: 145 mmol/L (ref 135–145)
Total Bilirubin: 0.6 mg/dL (ref 0.3–1.2)
Total Protein: 8.5 g/dL — ABNORMAL HIGH (ref 6.5–8.1)

## 2015-06-28 LAB — ACETAMINOPHEN LEVEL: Acetaminophen (Tylenol), Serum: <10 ug/mL — ABNORMAL LOW (ref 10–30)

## 2015-06-28 LAB — ETHANOL: Alcohol, Ethyl (B): 392 mg/dL (ref <5)

## 2015-06-28 LAB — SALICYLATE LEVEL: Salicylate Lvl: <4 mg/dL (ref 2.8–30.0)

## 2015-06-28 NOTE — ED Notes (Signed)
Pt presents to ED accompanies by [place] police officers. Pt's daughter and wife told officers that pt was drinking and became aggressive banking his head on wall. Pt states he did not do anything. Pt reports that he has chronic left leg pain.

## 2015-06-29 LAB — CBC WITH DIFFERENTIAL/PLATELET
Basophils Absolute: 0 10*3/uL (ref 0–0.1)
Basophils Relative: 0 %
Eosinophils Absolute: 0.1 10*3/uL (ref 0–0.7)
Eosinophils Relative: 1 %
HCT: 40.6 % (ref 40.0–52.0)
Hemoglobin: 13.6 g/dL (ref 13.0–18.0)
Lymphocytes Relative: 67 %
Lymphs Abs: 5.4 10*3/uL — ABNORMAL HIGH (ref 1.0–3.6)
MCH: 30.8 pg (ref 26.0–34.0)
MCHC: 33.4 g/dL (ref 32.0–36.0)
MCV: 92.1 fL (ref 80.0–100.0)
Monocytes Absolute: 0.5 10*3/uL (ref 0.2–1.0)
Monocytes Relative: 6 %
Neutro Abs: 2.1 10*3/uL (ref 1.4–6.5)
Neutrophils Relative %: 26 %
Platelets: 509 10*3/uL — ABNORMAL HIGH (ref 150–440)
RBC: 4.41 MIL/uL (ref 4.40–5.90)
RDW: 15 % — ABNORMAL HIGH (ref 11.5–14.5)
WBC: 8.1 10*3/uL (ref 3.8–10.6)

## 2015-06-29 NOTE — Discharge Instructions (Signed)
Intoxicación alcohólica  (Alcohol Intoxication)  La intoxicación alcohólica se produce cuando la cantidad de alcohol que se ha consumido daña la capacidad de funcionamiento mental y físico. El alcohol deteriora directamente la actividad química normal del cerebro. Beber grandes cantidades de alcohol puede conducir a cambios en el funcionamiento mental y en el comportamiento, y puede causar muchos efectos físicos que pueden ser perjudiciales.   La intoxicación alcohólica puede variar en gravedad desde leve hasta muy grave. Hay varios factores que pueden afectar el nivel de intoxicación que se produce, como la edad de la persona, el sexo, el peso, la frecuencia de consumo de alcohol, y la presencia de otras enfermedades médicas (como diabetes, convulsiones o enfermedades del corazón). Los niveles peligrosos de intoxicación por alcohol pueden ocurrir cuando las personas beben grandes cantidades de alcohol en un corto periodo de tiempo (emborracharse). El alcohol también puede ser especialmente peligroso cuando se combina con ciertos medicamentos recetados o drogas "recreativas".  SIGNOS Y SÍNTOMAS  Algunos de los signos y síntomas comunes de intoxicación leve por alcohol incluyen:  · Pérdida de la coordinación.  · Cambios en el estado de ánimo y la conducta.  · Incapacidad para razonar.  · Hablar arrastrando las palabras.  A medida que la intoxicación por alcohol avanza a niveles más graves, van a aparecer otros signos y síntomas. Estos pueden ser:  · Vómitos.  · Confusión y alteración de la memoria.  · Disminución de la frecuencia respiratoria.  · Convulsiones.  · Pérdida de la conciencia.  DIAGNÓSTICO   El médico le hará una historia clínica y un examen físico. Se le preguntará acerca de la cantidad y el tipo de alcohol que ha consumido. Se le realizarán análisis de sangre para medir la concentración de alcohol en sangre. En muchos lugares, el nivel de alcohol en la sangre debe ser inferior a 80 mg / dL (0,08%) para  poder conducir legalmente. Sin embargo, hay muchos efectos peligrosos del alcohol que pueden ocurrir con niveles mucho más bajos.   TRATAMIENTO   Las personas con intoxicación por alcohol a menudo no requieren tratamiento. La mayor parte de los efectos de la intoxicación por alcohol son temporales, y desaparecen a medida que el alcohol abandona el cuerpo de forma natural. El profesional controlará su estado hasta que esté lo suficientemente estable como para volver a casa. A veces se administran líquidos por vía intravenosa para ayudar a evitar la deshidratación.   INSTRUCCIONES PARA EL CUIDADO EN EL HOGAR  · No conduzca vehículos después de beber alcohol.  · Manténgase hidratado. Beba gran cantidad de líquido para mantener la orina de tono claro o color amarillo pálido. Evite la cafeína.    · Tome sólo medicamentos de venta libre o recetados, según las indicaciones del médico.    SOLICITE ATENCIÓN MÉDICA SI:   · Tiene vómitos persistentes.    · No mejora luego de algunos días.  · Se intoxica con alcohol con frecuencia. El médico podrá ayudarlo a decidir si debe consultar a un terapeuta especializado en el abuso de sustancias.  SOLICITE ATENCIÓN MÉDICA DE INMEDIATO SI:   · Se siente vacilante o tembloroso cuando trata de abandonar el hábito.    · Comienza a temblar de manera incontrolable (convulsiones).    · Vomita sangre. Puede ser sangre de color rojo brillante o similar al sedimento del café negro.    · Observa sangre en la materia fecal. Puede ser de color rojo brillante o de aspecto alquitranado, con olor fétido.    · Se siente mareado o se desmaya.      ASEGÚRESE DE QUE:   · Comprende estas instrucciones.  · Controlará su afección.  · Recibirá ayuda de inmediato si no mejora o si empeora.     Esta información no tiene como fin reemplazar el consejo del médico. Asegúrese de hacerle al médico cualquier pregunta que tenga.     Document Released: 07/09/2005 Document Revised: 03/11/2013  Elsevier Interactive Patient  Education ©2016 Elsevier Inc.

## 2015-06-29 NOTE — ED Notes (Signed)
Pt reports being brought to the ER by the police after his daughter called [employer]. Pt states "I don't know why she called them and why they brought me here. I just want to go home". Per triage note daughter and wife stated that pt was drinking, became aggressive and was banging his head against the wall. When asked about hitting his head on the wall pt stated "you don't see anything do you?". I palpated pt head no painful or swollen areas palpated.  Pt denies SI, HI, AH, VH, illicit drug use and paranoia. Pt does report drinking a 6 pack of beer a day. Pt states "I drink about 6 beers a day, in my house and I don't bother no one".  Pt with accent, but is able to speak and understand english. Pt verbalizes in full sentences, no other medical needs verbalized at this time.
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
## 2015-06-29 NOTE — ED Notes (Signed)

## 2015-08-17 DIAGNOSIS — E538 Deficiency of other specified B group vitamins: Secondary | ICD-10-CM | POA: Insufficient documentation

## 2015-08-17 DIAGNOSIS — F10951 Alcohol use, unspecified with alcohol-induced psychotic disorder with hallucinations: Secondary | ICD-10-CM | POA: Insufficient documentation

## 2015-08-31 ENCOUNTER — Emergency Department
Admission: EM | Admit: 2015-08-31 | Discharge: 2015-09-01 | Disposition: A | Discharge status: Other Acute Inpatient Hospital | Payer: Medicaid Other | Attending: Emergency Medicine | Admitting: Emergency Medicine

## 2015-08-31 ENCOUNTER — Encounter: Payer: Self-pay | Admitting: Emergency Medicine

## 2015-08-31 DIAGNOSIS — F4310 Post-traumatic stress disorder, unspecified: Secondary | ICD-10-CM | POA: Insufficient documentation

## 2015-08-31 DIAGNOSIS — F1010 Alcohol abuse, uncomplicated: Secondary | ICD-10-CM | POA: Insufficient documentation

## 2015-08-31 DIAGNOSIS — E039 Hypothyroidism, unspecified: Secondary | ICD-10-CM

## 2015-08-31 DIAGNOSIS — Z046 Encounter for general psychiatric examination, requested by authority: Secondary | ICD-10-CM | POA: Diagnosis present

## 2015-08-31 DIAGNOSIS — F1994 Other psychoactive substance use, unspecified with psychoactive substance-induced mood disorder: Secondary | ICD-10-CM | POA: Diagnosis not present

## 2015-08-31 DIAGNOSIS — K219 Gastro-esophageal reflux disease without esophagitis: Secondary | ICD-10-CM

## 2015-08-31 DIAGNOSIS — F4312 Post-traumatic stress disorder, chronic: Secondary | ICD-10-CM

## 2015-08-31 DIAGNOSIS — Z79899 Other long term (current) drug therapy: Secondary | ICD-10-CM | POA: Diagnosis not present

## 2015-08-31 DIAGNOSIS — M5137 Other intervertebral disc degeneration, lumbosacral region: Secondary | ICD-10-CM

## 2015-08-31 LAB — COMPREHENSIVE METABOLIC PANEL
ALT: 50 U/L (ref 17–63)
AST: 31 U/L (ref 15–41)
Albumin: 4.7 g/dL (ref 3.5–5.0)
Alkaline Phosphatase: 60 U/L (ref 38–126)
Anion gap: 9 (ref 5–15)
BUN: 11 mg/dL (ref 6–20)
CO2: 27 mmol/L (ref 22–32)
Calcium: 9 mg/dL (ref 8.9–10.3)
Chloride: 107 mmol/L (ref 101–111)
Creatinine, Ser: 0.72 mg/dL (ref 0.61–1.24)
GFR calc Af Amer: >60 mL/min (ref >60)
GFR calc non Af Amer: >60 mL/min (ref >60)
Glucose, Bld: 101 mg/dL — ABNORMAL HIGH (ref 65–99)
Potassium: 4.2 mmol/L (ref 3.5–5.1)
Sodium: 143 mmol/L (ref 135–145)
Total Bilirubin: 0.2 mg/dL — ABNORMAL LOW (ref 0.3–1.2)
Total Protein: 8.7 g/dL — ABNORMAL HIGH (ref 6.5–8.1)

## 2015-08-31 LAB — CBC
HCT: 43.7 % (ref 40.0–52.0)
Hemoglobin: 14.6 g/dL (ref 13.0–18.0)
MCH: 30.9 pg (ref 26.0–34.0)
MCHC: 33.3 g/dL (ref 32.0–36.0)
MCV: 92.8 fL (ref 80.0–100.0)
Platelets: 491 10*3/uL — ABNORMAL HIGH (ref 150–440)
RBC: 4.71 MIL/uL (ref 4.40–5.90)
RDW: 15 % — ABNORMAL HIGH (ref 11.5–14.5)
WBC: 7.8 10*3/uL (ref 3.8–10.6)

## 2015-08-31 LAB — ETHANOL: Alcohol, Ethyl (B): 341 mg/dL (ref <5)

## 2015-08-31 LAB — URINE DRUG SCREEN, QUALITATIVE (ARMC ONLY)
Amphetamines, Ur Screen: NOT DETECTED
Barbiturates, Ur Screen: NOT DETECTED
Benzodiazepine, Ur Scrn: NOT DETECTED
Cannabinoid 50 Ng, Ur ~~LOC~~: NOT DETECTED
Cocaine Metabolite,Ur ~~LOC~~: NOT DETECTED
MDMA (Ecstasy)Ur Screen: NOT DETECTED
Methadone Scn, Ur: NOT DETECTED
Opiate, Ur Screen: NOT DETECTED
Phencyclidine (PCP) Ur S: NOT DETECTED
Tricyclic, Ur Screen: NOT DETECTED

## 2015-08-31 LAB — ACETAMINOPHEN LEVEL: Acetaminophen (Tylenol), Serum: <10 ug/mL — ABNORMAL LOW (ref 10–30)

## 2015-08-31 LAB — SALICYLATE LEVEL: Salicylate Lvl: <4 mg/dL (ref 2.8–30.0)

## 2015-08-31 MED ORDER — VITAMIN B-1 100 MG PO TABS
100.0000 mg | ORAL_TABLET | Freq: Every day | ORAL | Status: DC
  Administered 2015-09-01: 100 mg via ORAL
  Filled 2015-08-31: qty 1

## 2015-08-31 MED ORDER — ACETAMINOPHEN 500 MG PO TABS
1000.0000 mg | ORAL_TABLET | Freq: Once | ORAL | Status: AC
  Administered 2015-08-31: 1000 mg via ORAL
  Filled 2015-08-31: qty 2

## 2015-08-31 MED ORDER — LORAZEPAM 2 MG/ML IJ SOLN: 0.0000 mg | Freq: Two times a day (BID) | INTRAMUSCULAR | Status: DC

## 2015-08-31 MED ORDER — LORAZEPAM 2 MG/ML IJ SOLN: 0.0000 mg | Freq: Four times a day (QID) | INTRAMUSCULAR | Status: DC

## 2015-08-31 MED ORDER — LORAZEPAM 2 MG PO TABS
0.0000 mg | ORAL_TABLET | Freq: Two times a day (BID) | ORAL | Status: DC
Start: 2015-08-31 — End: 2015-09-01
  Filled 2015-08-31: qty 1

## 2015-08-31 MED ORDER — THIAMINE HCL 100 MG/ML IJ SOLN: 100.0000 mg | Freq: Every day | INTRAMUSCULAR | Status: DC

## 2015-08-31 MED ORDER — LORAZEPAM 2 MG PO TABS
0.0000 mg | ORAL_TABLET | Freq: Four times a day (QID) | ORAL | Status: DC
  Administered 2015-09-01: 2 mg via ORAL

## 2015-08-31 NOTE — ED Notes (Signed)

## 2015-08-31 NOTE — ED Notes (Addendum)
Patient presents to the ED with IVC papers.  Patient states he recently went to [place] in November and really struggled because he hadn't been there in over twenty years and things had completely changed.  Patient was tearful when he talked about going to [place].  Patient states he had visual hallucinations when he was there.  Per police, patient's family told them that patient has been committed before and "lies" to psychiatrists.  Patient denies SI and HI.  Per IVC papers patient has been drinking heavily and texted his family that it would be better if he was dead.  Patient states he drank a large shot of tequila today.  Patient reports drinking about 1 bottle of tequila daily.

## 2015-08-31 NOTE — BH Assessment (Signed)
Assessment Note  Ivan Hamilton is a  50 y.o. male presenting to the ED under IVC for concerns with PTSD and acute alcohol intoxication.  According to the IVC, pt has a previous diagnosis of PTSD and is not taking his medications as prescribed.  He has been drinking a bottle of tequila per day.  His family reports that they received a text messages from him in which pt stated he wanted to die and that he would be better off dead.  The IVC also states that the family has been hallucinating by seeing people who are not present.    Pt reports that he has been drinking for "decades" and that he drinks a bottle of tequila a day.  He states that the longest he has been sober was for 2 weeks while in rehab in 03/2015.  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  He reports visual hallucinations of seeing a pair of black hands in his bedroom and a dark shower in his house.    Since loosing his job, patient reports spending increased time on the couch, not showering or dressing, not eating and not participating in his normal activities.  Pt's wife report that he becoming aggressive at home and breaking things.  She is concerned about the safety of their grandchild, who lives with them, and is fearful of his return home without treatment.  Pt reports worsening depression, difficulty falling and staying asleep and low appetite.   Diagnosis: PTSD/Alcohol intoxication  Past Medical History: History reviewed. No pertinent past medical history.  Past Surgical History  Procedure Laterality Date  . Leg surgery      Family History: No family history on file.  Social History:  reports that he has never smoked. He does not have any smokeless tobacco history on file. He reports that he drinks alcohol. He reports that he does not use illicit drugs.  Additional Social History:  Alcohol / Drug Use History of alcohol / drug use?: Yes Substance #1 Name of  Substance 1: ETOH 1 - Age of First Use: "decades" 1 - Amount (size/oz): bottle of tequilla 1 - Frequency: daily 1 - Duration: all day 1 - Last Use / Amount: 08/31/15  CIWA: CIWA-Ar BP: (!) 117/92 mmHg Pulse Rate: 83 COWS:    Allergies: No Known Allergies  Home Medications:  (Not in a hospital admission)  OB/GYN Status:  No LMP for male patient.  General Assessment Data Location of Assessment: [facility] ED TTS Assessment: In system Is this a Tele or Face-to-Face Assessment?: Face-to-Face Is this an Initial Assessment or a Re-assessment for this encounter?: Initial Assessment Marital status: Married [place] name: N/A Is patient pregnant?: No Pregnancy Status: No Living Arrangements: Spouse/significant other, Children Can pt return to current living arrangement?: Yes Admission Status: Involuntary Is patient capable of signing voluntary admission?: No Referral Source: Self/Family/Friend Insurance type: Medicaid  Medical Screening Exam ([place]) Medical Exam completed: Yes  Crisis Care Plan Living Arrangements: Spouse/significant other, Children Legal Guardian: Other: (self) Name of Psychiatrist: None identified Name of Therapist: None identified  Education Status Is patient currently in school?: No Current Grade: N/A Highest grade of school patient has completed: N/A Name of school: N/A Contact person: N/A  Risk to self with the past 6 months Suicidal Ideation: No (Pt denies) Has patient been a risk to self within the past 6 months prior to admission? : No Suicidal Intent: No Has patient  had any suicidal intent within the past 6 months prior to admission? : No Is patient at risk for suicide?: Yes Suicidal Plan?: No Has patient had any suicidal plan within the past 6 months prior to admission? : No Access to Means: Yes Specify Access to Suicidal Means: pt has access to alcohol What has been your use of drugs/alcohol within the last 12 months?: Tequilla, daily  binge last use 08/31/15 Previous Attempts/Gestures: No How many times?: 0 Other Self Harm Risks: Daily alcohol binges Triggers for Past Attempts: None known Intentional Self Injurious Behavior: None Family Suicide History: No Recent stressful life event(s): Job Loss, Financial Problems Persecutory voices/beliefs?: No Depression: Yes Depression Symptoms: Loss of interest in usual pleasures, Feeling worthless/self pity Substance abuse history and/or treatment for substance abuse?: Yes Suicide prevention information given to non-admitted patients: Not applicable  Risk to Others within the past 6 months Homicidal Ideation: No Does patient have any lifetime risk of violence toward others beyond the six months prior to admission? : No Thoughts of Harm to Others: No Current Homicidal Intent: No Current Homicidal Plan: No Access to Homicidal Means: No Identified Victim: None identified History of harm to others?: No Assessment of Violence: None Noted Violent Behavior Description: None identified Does patient have access to weapons?: No Criminal Charges Pending?: No Does patient have a court date: No Is patient on probation?: No  Psychosis Hallucinations: Visual Delusions: None noted  Mental Status Report Appearance/Hygiene: In scrubs Eye Contact: Good Motor Activity: Freedom of movement Speech: Logical/coherent Level of Consciousness: Alert Mood: Anxious, Suspicious, Pleasant Affect: Anxious, Apprehensive Anxiety Level: Minimal Thought Processes: Coherent, Circumstantial Judgement: Partial Orientation: Person, Place Obsessive Compulsive Thoughts/Behaviors: None  Cognitive Functioning Concentration: Normal Memory: Recent Intact, Remote Intact IQ: Average Insight: Fair Impulse Control: Fair Appetite: Fair Weight Loss: 0 Weight Gain: 0 Sleep: Decreased Total Hours of Sleep: 5 Vegetative Symptoms: None  ADLScreening [facility] Assessment Services) Patient's cognitive ability  adequate to safely complete daily activities?: Yes Patient able to express need for assistance with ADLs?: Yes Independently performs ADLs?: Yes (appropriate for developmental age)  Prior Inpatient Therapy Prior Inpatient Therapy: Yes Prior Therapy Dates: 05/2015 Prior Therapy Facilty/Provider(s): [facility] Reason for Treatment: alcohol  Prior Outpatient Therapy Prior Outpatient Therapy: No Prior Therapy Dates: N/A Prior Therapy Facilty/Provider(s): N/A Reason for Treatment: N/A Does patient have an ACCT team?: No Does patient have Intensive In-House Services?  : No Does patient have [facility] services? : No Does patient have P4CC services?: No  ADL Screening (condition at time of admission) Patient's cognitive ability adequate to safely complete daily activities?: Yes Patient able to express need for assistance with ADLs?: Yes Independently performs ADLs?: Yes (appropriate for developmental age)       Abuse/Neglect Assessment (Assessment to be complete while patient is alone) Physical Abuse: Denies Verbal Abuse: Denies Sexual Abuse: Denies Exploitation of patient/patient's resources: Denies Self-Neglect: Denies Values / Beliefs Cultural Requests During Hospitalization: None Spiritual Requests During Hospitalization: None Consults Spiritual Care Consult Needed: No Social Work Consult Needed: No Regulatory affairs officer (For Healthcare) Does patient have an advance directive?: No Would patient like information on creating an advanced directive?: No - patient declined information    Additional Information 1:1 In Past 12 Months?: No CIRT Risk: No Elopement Risk: No Does patient have medical clearance?: No     Disposition:  Disposition Initial Assessment Completed for this Encounter: Yes Disposition of Patient: Other dispositions Other disposition(s): Other (Comment) (Psych MD consult)  On Site Evaluation by:   Reviewed  with Physician:    Oneita Hurt 08/31/2015 11:17  PM

## 2015-08-31 NOTE — ED Provider Notes (Signed)
Time Seen: Approximately 1950 I have reviewed the triage notes  Chief Complaint: Psychiatric Evaluation   History of Present Illness: Ivan Hamilton is a 50 y.o. male who has a history of what appears to be alcohol abuse along with posttraumatic stress disorder. The patient apparently drinks at least  a bottle of tequila a day. Patient denied any alcohol consumption since "" this morning "". Patient was transported here by police with IVC paperwork in place. The patient apparently is been having some visual hallucination where he states he sees black shadows. The patient denies any suicidal thoughts or homicidal thoughts. IVC paperwork states differently that the patient has talked of suicide at time home. Stated to a family member he would be better off if he was dead. He admits to alcohol abuse and states that he does wish for rehabilitation. IVC paperwork also states patient's been noncompliant with his medications.  History reviewed. No pertinent past medical history.  Patient Active Problem List   Diagnosis Date Noted  . Alcohol abuse 05/17/2015  . 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 Leg surgery      Past Surgical History  Procedure Laterality Date  . Leg surgery      Current Outpatient Rx  Name  Route  Sig  Dispense  Refill  . ALPRAZolam (XANAX) 0.25 MG tablet   Oral   Take 0.25 mg by mouth 3 (three) times daily as needed for anxiety.         . folic acid (FOLVITE) 1 MG tablet   Oral   Take 1 mg by mouth daily.         Marland Kitchen gabapentin (NEURONTIN) 300 MG capsule   Oral   Take 600 mg by mouth 3 (three) times daily.         Marland Kitchen lactulose (CHRONULAC) 10 GM/15ML solution   Oral   Take 10 g by mouth 3 (three) times daily.         Marland Kitchen levothyroxine (SYNTHROID, LEVOTHROID) 100 MCG tablet   Oral   Take 100 mcg by mouth daily before breakfast.         . oxyCODONE-acetaminophen (PERCOCET/ROXICET) 5-325 MG  tablet   Oral   Take 1-2 tablets by mouth every 6 (six) hours as needed for severe pain.           Allergies:  Review of patient's allergies indicates no known allergies.  Family History: No family history on file.  Social History: Social History  Substance Use Topics  . Smoking status: Never Smoker   . Smokeless tobacco: None  . Alcohol Use: Yes     Comment: daily     Review of Systems:   10 point review of systems was performed and was otherwise negative:  Constitutional: No fever he denies any tactile hallucinations Eyes: No visual disturbances outside of occasional visual hallucinations ENT: No sore throat, ear pain Cardiac: No chest pain Respiratory: No shortness of breath, wheezing, or stridor Abdomen: No abdominal pain, no vomiting, No diarrhea Endocrine: No weight loss, No night sweats Extremities: No peripheral edema, cyanosis Skin: No rashes, easy bruising Neurologic: No focal weakness, trouble with speech or swollowing Urologic: No dysuria, Hematuria, or urinary frequency   Physical Exam:  ED Triage Vitals  Enc Vitals Group     BP 08/31/15 1730 141/106 mmHg     Pulse Rate 08/31/15 1730 107     Resp 08/31/15 1730 16  Temp 08/31/15 1730 98.1 F (36.7 C)     Temp Source 08/31/15 1730 Oral     SpO2 08/31/15 1730 99 %     Weight 08/31/15 1730 137 lb (62.143 kg)     Height 08/31/15 1730 5' (1.524 m)     Head Cir --      Peak Flow --      Pain Score 08/31/15 1733 0     Pain Loc --      Pain Edu? --      Excl. in [place]? --     General: Awake , Alert , and Oriented times 3; GCS 15. Patient smells of alcohol Head: Normal cephalic , atraumatic Eyes: Pupils equal , round, reactive to light conjunctiva bilateral injection Nose/Throat: No nasal drainage, patent upper airway without erythema or exudate.  Neck: Supple, Full range of motion, No anterior adenopathy or palpable thyroid masses Lungs: Clear to ascultation without wheezes , rhonchi, or  rales Heart: Regular rate, regular rhythm without murmurs , gallops , or rubs Abdomen: Soft, non tender without rebound, guarding , or rigidity; bowel sounds positive and symmetric in all 4 quadrants. No organomegaly .        Extremities: 2 plus symmetric pulses. No edema, clubbing or cyanosis Neurologic: normal ambulation, Motor symmetric without deficits, sensory intact Skin: warm, dry, no rashes   Labs:   All laboratory work was reviewed including any pertinent negatives or positives listed below:  Labs Reviewed  COMPREHENSIVE METABOLIC PANEL - Abnormal; Notable for the following:    Glucose, Bld 101 (*)    Total Protein 8.7 (*)    Total Bilirubin 0.2 (*)    All other components within normal limits  ETHANOL - Abnormal; Notable for the following:    Alcohol, Ethyl (B) 341 (*)    All other components within normal limits  ACETAMINOPHEN LEVEL - Abnormal; Notable for the following:    Acetaminophen (Tylenol), Serum <10 (*)    All other components within normal limits  CBC - Abnormal; Notable for the following:    RDW 15.0 (*)    Platelets 491 (*)    All other components within normal limits  SALICYLATE LEVEL  URINE DRUG SCREEN, QUALITATIVE ([facility] ONLY)   blood alcohol level was significantly elevated at 341. Otherwise laboratory work is within normal limits     ED Course: * I have continued the IVC paperwork is difficult to tell whether or not the patient is giving [place] accurate information or not. He has a history of substance abuse. Whether or not he is having to visual hallucinations cannot be ascertained right now. He is also may be alcohol withdrawal hallucinations. Patient will require observation to reach sobriety and then a consultation with TTS services along with psychiatry.   Assessment: * Alcohol abuse Possible hallucinations Possible suicidal ideation     Plan:  Observation with psychiatric evaluation           Daymon Larsen, MD 08/31/15 [phone]

## 2015-09-01 ENCOUNTER — Inpatient Hospital Stay
Admission: EM | Admit: 2015-09-01 | Discharge: 2015-09-05 | DRG: 897 | Disposition: A | Discharge status: Home / Self Care | Payer: Medicaid Other | Source: Intra-hospital | Attending: Psychiatry | Admitting: Psychiatry

## 2015-09-01 DIAGNOSIS — M5137 Other intervertebral disc degeneration, lumbosacral region: Secondary | ICD-10-CM | POA: Diagnosis present

## 2015-09-01 DIAGNOSIS — F4312 Post-traumatic stress disorder, chronic: Secondary | ICD-10-CM

## 2015-09-01 DIAGNOSIS — F1994 Other psychoactive substance use, unspecified with psychoactive substance-induced mood disorder: Secondary | ICD-10-CM | POA: Diagnosis not present

## 2015-09-01 DIAGNOSIS — E039 Hypothyroidism, unspecified: Secondary | ICD-10-CM

## 2015-09-01 DIAGNOSIS — F10229 Alcohol dependence with intoxication, unspecified: Secondary | ICD-10-CM

## 2015-09-01 DIAGNOSIS — M51379 Other intervertebral disc degeneration, lumbosacral region without mention of lumbar back pain or lower extremity pain: Secondary | ICD-10-CM

## 2015-09-01 DIAGNOSIS — F10939 Alcohol use, unspecified with withdrawal, unspecified: Secondary | ICD-10-CM

## 2015-09-01 DIAGNOSIS — F10239 Alcohol dependence with withdrawal, unspecified: Secondary | ICD-10-CM | POA: Diagnosis present

## 2015-09-01 DIAGNOSIS — F1024 Alcohol dependence with alcohol-induced mood disorder: Secondary | ICD-10-CM | POA: Diagnosis present

## 2015-09-01 DIAGNOSIS — K219 Gastro-esophageal reflux disease without esophagitis: Secondary | ICD-10-CM | POA: Diagnosis present

## 2015-09-01 DIAGNOSIS — F1020 Alcohol dependence, uncomplicated: Secondary | ICD-10-CM

## 2015-09-01 DIAGNOSIS — Z9889 Other specified postprocedural states: Secondary | ICD-10-CM | POA: Diagnosis not present

## 2015-09-01 DIAGNOSIS — F4310 Post-traumatic stress disorder, unspecified: Secondary | ICD-10-CM | POA: Diagnosis present

## 2015-09-01 DIAGNOSIS — F10230 Alcohol dependence with withdrawal, uncomplicated: Secondary | ICD-10-CM | POA: Diagnosis not present

## 2015-09-01 DIAGNOSIS — F3289 Other specified depressive episodes: Secondary | ICD-10-CM

## 2015-09-01 MED ORDER — LORAZEPAM 2 MG PO TABS
0.0000 mg | ORAL_TABLET | Freq: Two times a day (BID) | ORAL | Status: DC
  Administered 2015-09-01: 2 mg via ORAL
  Filled 2015-09-01: qty 1

## 2015-09-01 MED ORDER — MAGNESIUM HYDROXIDE 400 MG/5ML PO SUSP: 30.0000 mL | Freq: Every day | ORAL | Status: DC | PRN

## 2015-09-01 MED ORDER — LORAZEPAM 2 MG PO TABS
0.0000 mg | ORAL_TABLET | Freq: Four times a day (QID) | ORAL | Status: DC
  Administered 2015-09-01: 2 mg via ORAL
  Administered 2015-09-02: 1 mg via ORAL
  Filled 2015-09-01 (×2): qty 1

## 2015-09-01 MED ORDER — ACETAMINOPHEN 325 MG PO TABS
650.0000 mg | ORAL_TABLET | ORAL | Status: AC
  Administered 2015-09-01: 650 mg via ORAL
  Filled 2015-09-01: qty 2

## 2015-09-01 MED ORDER — LEVOTHYROXINE SODIUM 100 MCG PO TABS
100.0000 ug | ORAL_TABLET | Freq: Every day | ORAL | Status: DC
  Administered 2015-09-02 – 2015-09-05 (×4): 100 ug via ORAL
  Filled 2015-09-01 (×5): qty 1

## 2015-09-01 MED ORDER — THIAMINE HCL 100 MG/ML IJ SOLN
100.0000 mg | Freq: Every day | INTRAMUSCULAR | Status: DC
  Filled 2015-09-01: qty 2

## 2015-09-01 MED ORDER — LORAZEPAM 2 MG/ML IJ SOLN: 0.0000 mg | Freq: Four times a day (QID) | INTRAMUSCULAR | Status: DC

## 2015-09-01 MED ORDER — ACETAMINOPHEN 325 MG PO TABS
650.0000 mg | ORAL_TABLET | Freq: Four times a day (QID) | ORAL | Status: DC | PRN
  Administered 2015-09-01 – 2015-09-03 (×3): 650 mg via ORAL
  Filled 2015-09-01 (×3): qty 2

## 2015-09-01 MED ORDER — LEVOTHYROXINE SODIUM 100 MCG PO TABS
100.0000 ug | ORAL_TABLET | Freq: Every day | ORAL | Status: DC
  Administered 2015-09-01: 100 ug via ORAL
  Filled 2015-09-01 (×3): qty 1

## 2015-09-01 MED ORDER — LORAZEPAM 2 MG/ML IJ SOLN: 0.0000 mg | Freq: Two times a day (BID) | INTRAMUSCULAR | Status: DC

## 2015-09-01 MED ORDER — ALUM & MAG HYDROXIDE-SIMETH 200-200-20 MG/5ML PO SUSP
30.0000 mL | ORAL | Status: DC | PRN
  Administered 2015-09-03 – 2015-09-04 (×2): 30 mL via ORAL
  Filled 2015-09-01 (×3): qty 30

## 2015-09-01 MED ORDER — VITAMIN B-1 100 MG PO TABS
100.0000 mg | ORAL_TABLET | Freq: Every day | ORAL | Status: DC
  Administered 2015-09-02 – 2015-09-05 (×4): 100 mg via ORAL
  Filled 2015-09-01 (×4): qty 1

## 2015-09-01 MED ORDER — GABAPENTIN 300 MG PO CAPS
300.0000 mg | ORAL_CAPSULE | Freq: Three times a day (TID) | ORAL | Status: DC
  Administered 2015-09-01: 300 mg via ORAL
  Filled 2015-09-01: qty 1

## 2015-09-01 MED ORDER — NALTREXONE HCL 50 MG PO TABS: 25.0000 mg | ORAL_TABLET | Freq: Every day | ORAL | Status: DC

## 2015-09-01 MED ORDER — PANTOPRAZOLE SODIUM 40 MG PO TBEC
40.0000 mg | DELAYED_RELEASE_TABLET | Freq: Every day | ORAL | Status: DC
  Administered 2015-09-01: 40 mg via ORAL
  Filled 2015-09-01: qty 1

## 2015-09-01 MED ORDER — GABAPENTIN 300 MG PO CAPS
300.0000 mg | ORAL_CAPSULE | Freq: Three times a day (TID) | ORAL | Status: DC
  Administered 2015-09-01 – 2015-09-02 (×3): 300 mg via ORAL
  Filled 2015-09-01 (×3): qty 1

## 2015-09-01 NOTE — ED Notes (Signed)
Pt given phone at this time to speak to his wife. NAD noted at this time.

## 2015-09-01 NOTE — ED Notes (Signed)
Pt pleasant and cooperative. Denies SI/HI.  States drank 1 bottle of tequila "last night."  Denies hx of dt's or seizures.  Desires recovery but is unsure of timing of pending inpatient tx due to visit from his mother from [place]. Compliant with scheduled medications.  No physical complaints.  POC explained and meal tray given. Safety maintained.

## 2015-09-01 NOTE — Progress Notes (Signed)
Admission Note:  D: Pt appeared depressed  With  a flat affect.  Pt  denies SI / AVH at this time  But prior to coming into the hospital patient was suicidal and hearing and seeing thinks Hx of PTSD from being a in [employer] Drinking daily  A bottle of  Tequila daily.Lost job  From [employer]  Recently in [facility]  For chest pain.  A: Pt admitted to unit per protocol, skin assessment and search done and no contraband found.  Pt  educated on therapeutic milieu rules. Pt was introduced to milieu by nursing staff.  R: Pt was receptive to education about the milieu .  15 min safety checks started. Probation officer offered support

## 2015-09-01 NOTE — Tx Team (Signed)
Initial Interdisciplinary Treatment Plan   PATIENT STRESSORS: Financial difficulties Health problems Substance abuse   PATIENT STRENGTHS: Ability for insight Capable of independent living Communication skills General fund of knowledge Supportive family/friends   PROBLEM LIST: Problem List/Patient Goals Date to be addressed Date deferred Reason deferred Estimated date of resolution  Substance Abuse  09/01/15     PTSD 09/01/15     Depression 09/01/15                                          DISCHARGE CRITERIA:  Improved stabilization in mood, thinking, and/or behavior Safe-care adequate arrangements made  PRELIMINARY DISCHARGE PLAN: Outpatient therapy Return to previous living arrangement Return to previous work or school arrangements  PATIENT/FAMIILY INVOLVEMENT: This treatment plan has been presented to and reviewed with the patient, Ivan Hamilton, and/or family member,.  The patient and family have been given the opportunity to ask questions and make suggestions.  Ivan Hamilton A [name] 09/01/2015, 4:00 PM

## 2015-09-01 NOTE — ED Provider Notes (Signed)
-----------------------------------------   6:59 AM on 09/01/2015 -----------------------------------------   Blood pressure 117/92, pulse 83, temperature 97.9 F (36.6 C), temperature source Oral, resp. rate 18, height 5' (1.524 m), weight 62.143 kg, SpO2 98 %.  The patient had no acute events since last update.  Calm and cooperative at this time.  Disposition is pending per Psychiatry/Behavioral Medicine team recommendations.  CIWA protocol has been initiated.  Refer to the primary ED provider note for more details.  Reportedly his family is quite concerned about him due to his alcoholism and denial about the problem.     Hinda Kehr, MD 09/01/15 [phone]

## 2015-09-01 NOTE — ED Notes (Signed)
NAD noted at this time. Pt resting in bed with the lights off and TV on. Pt calm and cooperative with staff. No distress noted at this time. Respirations even and unlabored without difficulty.

## 2015-09-01 NOTE — ED Notes (Signed)
Report given to Lestine Mount, Therapist, sports.
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
## 2015-09-01 NOTE — ED Notes (Signed)
Upon assessment this morning, pt states "I feel good". Pt is alert, calm and cooperative with staff at this time. Mild tremors noted to both hands. Pt denies SI/HI, denies A/V/T hallucinations at this time. Pt is ambulatory without difficulty. Denies needs at this times, states that he has a "very little headache" at this time. Will continue with 15 min checks. On assessment pt CIWA is 5, will continue to monitor for further changes at this time.

## 2015-09-01 NOTE — Progress Notes (Signed)
Recreation Therapy Notes  Date: 02.09.17 Time: 3:00 pm Location: Craft Room  Group Topic: Leisure Education  Goal Area(s) Addresses:  Patient will identify activities for each letter of the alphabet. Patient will verbalize ability to integrate positive leisure into life post d/c. Patient will verbalize ability to use leisure as a Technical sales engineer.  Behavioral Response: Did not attend  Intervention: Leisure Alphabet  Activity: Patients were given a Leisure Air traffic controller and instructed to write healthy leisure activities for each letter of the alphabet.  Education: LRT educated patients on what they need to participate in leisure.  Education Outcome: Patient did not attend group.  Clinical Observations/Feedback: Patient did not attend group.  Leonette Monarch, LRT/CTRS 09/01/2015 3:59 PM

## 2015-09-01 NOTE — Plan of Care (Signed)
Problem: Alteration in mood & ability to function due to Goal: LTG-Pt reports reduction in suicidal thoughts (Patient reports reduction in suicidal thoughts and is able to verbalize a safety plan for whenever patient is feeling suicidal) Outcome: Progressing Denies suicidal ideations

## 2015-09-02 DIAGNOSIS — F10239 Alcohol dependence with withdrawal, unspecified: Secondary | ICD-10-CM

## 2015-09-02 DIAGNOSIS — F10229 Alcohol dependence with intoxication, unspecified: Secondary | ICD-10-CM

## 2015-09-02 DIAGNOSIS — F10939 Alcohol use, unspecified with withdrawal, unspecified: Secondary | ICD-10-CM

## 2015-09-02 DIAGNOSIS — F1020 Alcohol dependence, uncomplicated: Secondary | ICD-10-CM

## 2015-09-02 DIAGNOSIS — F1024 Alcohol dependence with alcohol-induced mood disorder: Secondary | ICD-10-CM

## 2015-09-02 DIAGNOSIS — F3289 Other specified depressive episodes: Secondary | ICD-10-CM

## 2015-09-02 MED ORDER — GABAPENTIN 300 MG PO CAPS
400.0000 mg | ORAL_CAPSULE | Freq: Three times a day (TID) | ORAL | Status: DC
  Administered 2015-09-02 – 2015-09-05 (×9): 400 mg via ORAL
  Filled 2015-09-02 (×9): qty 1

## 2015-09-02 MED ORDER — FLUOXETINE HCL 20 MG PO CAPS
20.0000 mg | ORAL_CAPSULE | Freq: Every day | ORAL | Status: DC
  Administered 2015-09-02 – 2015-09-05 (×4): 20 mg via ORAL
  Filled 2015-09-02 (×4): qty 1

## 2015-09-02 MED ORDER — CHLORDIAZEPOXIDE HCL 25 MG PO CAPS
50.0000 mg | ORAL_CAPSULE | Freq: Three times a day (TID) | ORAL | Status: DC
  Administered 2015-09-02 – 2015-09-03 (×3): 50 mg via ORAL
  Filled 2015-09-02 (×3): qty 2

## 2015-09-02 MED ORDER — LORAZEPAM 2 MG PO TABS
2.0000 mg | ORAL_TABLET | Freq: Three times a day (TID) | ORAL | Status: DC | PRN
  Administered 2015-09-04: 2 mg via ORAL
  Filled 2015-09-02: qty 1

## 2015-09-02 MED ORDER — NALTREXONE HCL 50 MG PO TABS
50.0000 mg | ORAL_TABLET | Freq: Every day | ORAL | Status: DC
  Administered 2015-09-02 – 2015-09-04 (×3): 50 mg via ORAL
  Filled 2015-09-02 (×5): qty 1

## 2015-09-02 MED ORDER — PANTOPRAZOLE SODIUM 40 MG PO TBEC
40.0000 mg | DELAYED_RELEASE_TABLET | Freq: Every day | ORAL | Status: DC
  Administered 2015-09-02 – 2015-09-05 (×4): 40 mg via ORAL
  Filled 2015-09-02 (×4): qty 1
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
## 2015-09-02 NOTE — BHH Group Notes (Signed)
[facility] LCSW Aftercare Discharge Planning Group Note   09/02/2015 4:09 PM  Participation Quality:  Patient was called to group but did not attend.    Keene Breath, MSW, LCSWA

## 2015-09-02 NOTE — BHH Group Notes (Signed)
[facility] LCSW Group Therapy  09/02/2015 4:26 PM  Type of Therapy:  Group Therapy  Participation Level:  Active  Participation Quality:  Appropriate and Attentive  Affect:  Appropriate  Cognitive:  Alert, Appropriate and Oriented  Insight:  Engaged  Engagement in Therapy:  Engaged  Modes of Intervention:  Discussion, Socialization and Support  Summary of Progress/Problems: Patient arrived late to group for the last 15 minutes sharing that he lost his job when he reported his boss for not doing his job and then took [employer] and was let go by his employer. Patient is disappointed and was able to share his feelings about the situation in group and got support from other group members.   Keene Breath, MSW, LCSWA 09/02/2015, 4:26 PM

## 2015-09-02 NOTE — Progress Notes (Signed)
D: Patient appears flat. He has been seen in the milieu and in his room coloring. He denies SI/HI/AVH at this time. He c/o of HA, with the pain at a 4. CIWA 11.  A: Medication given with education. Ativan 2 mg given for CIWA scoring. Encouragement provided.  R: Patient compliant with medication. He has remained calm and cooperative. Safety maintained with 15 min checks.

## 2015-09-02 NOTE — BHH Group Notes (Signed)
[facility] Group Notes:  (Nursing/MHT/Case Management/Adjunct)  Date:  09/02/2015  Time:  12:22 PM  Type of Therapy:  Group Therapy  Participation Level:  Active  Participation Quality:  Appropriate, Attentive, Sharing and Supportive  Affect:  Appropriate  Cognitive:  Alert, Appropriate and Oriented  Insight:  Appropriate  Engagement in Group:  Engaged  Modes of Intervention:  Activity  Summary of Progress/Problems:  Phila Shoaf De'Chelle Atanacio Melnyk 09/02/2015, 12:22 PM

## 2015-09-02 NOTE — Progress Notes (Signed)
Recreation Therapy Notes  INPATIENT RECREATION THERAPY ASSESSMENT  Patient Details Name: Ivan Hamilton MRN: [id number] DOB: 07/06/1966 Today's Date: 09/02/2015  Patient Stressors: Friends, Work (Lack of supportive friends; not getting a job quickly has been frustrating)  Coping Skills:   Isolate, Substance Abuse, Avoidance, Art/Dance, Talking, Music, Sports  Personal Challenges: Stress Management, Substance Abuse, Trusting Others  Leisure Interests (2+):  Individual - TV, Music - Listen, Individual - Other (Comment) (Facebook)  Awareness of Community Resources:  No  Community Resources:     Current Use:    If no, Barriers?:    Patient Strengths:  Hard working, helpful  Patient Identified Areas of Improvement:  No  Current Recreation Participation:  Working on cars  Patient Goal for Hospitalization:  To get out  [place] of Residence:  [place] of Residence:  [place]   Current SI (including self-harm):  No  Current HI:  No  Consent to Intern Participation: N/A   Leonette Monarch, LRT/CTRS 09/02/2015, 4:47 PM

## 2015-09-02 NOTE — Progress Notes (Addendum)
D: Patient denies SI/HI/AVH.  Patient affect is animated and his mood is pleasant.  Patient did attend group. Patient visible on the milieu. No distress noted. A: Support and encouragement offered. Scheduled medications given to pt. Q 15 min checks continued for patient safety. R: Patient receptive. Patient remains safe on the unit.

## 2015-09-02 NOTE — Plan of Care (Signed)
Problem: Ineffective individual coping Goal: STG: Patient will remain free from self harm Outcome: Progressing Patient has remained free from harm since admission.

## 2015-09-02 NOTE — BHH Suicide Risk Assessment (Signed)
[facility] Admission Suicide Risk Assessment   Nursing information obtained from:    Demographic factors:    Current Mental Status:    Loss Factors:    Historical Factors:    Risk Reduction Factors:     Total Time spent with patient: 1 hour Principal Problem: Alcohol-induced depressive disorder with moderate or severe use disorder with onset during intoxication [facility]) Diagnosis:   Patient Active Problem List   Diagnosis Date Noted  . Alcohol withdrawal ([place]) [F10.239] 09/02/2015  . Alcohol use disorder, severe, dependence ([place]) [F10.20] 09/02/2015  . 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[place]) [F10.24, F10.229, F32.89] 09/02/2015  . PTSD (post-traumatic stress disorder) [F43.10] 09/01/2015  . Hypothyroid [E03.9] 09/01/2015  . Gastric reflux [K21.9] 09/01/2015  . DDD (degenerative disc disease), lumbosacral [M51.37] 09/01/2015   Subjective Data:   Continued Clinical Symptoms:  Alcohol Use Disorder Identification Test Final Score (AUDIT): 15 The "Alcohol Use Disorders Identification Test", Guidelines for Use in Primary Care, Second Edition.  World Pharmacologist [facility]). Score between 0-7:  no or low risk or alcohol related problems. Score between 8-15:  moderate risk of alcohol related problems. Score between 16-19:  high risk of alcohol related problems. Score 20 or above:  warrants further diagnostic evaluation for alcohol dependence and treatment.   CLINICAL FACTORS:   Severe Anxiety and/or Agitation Alcohol/Substance Abuse/Dependencies Previous Psychiatric Diagnoses and Treatments   Psychiatric Specialty Exam: ROS  Blood pressure 127/100, pulse 93, temperature 98.6 F (37 C), temperature source Oral, resp. rate 18, height 5\' 5"  (1.651 m), weight 62.596 kg (138 lb), SpO2 100 %.Body mass index is 22.96 kg/(m^2).                                                        COGNITIVE FEATURES  THAT CONTRIBUTE TO RISK:  None    SUICIDE RISK:   Mild:  Suicidal ideation of limited frequency, intensity, duration, and specificity.  There are no identifiable plans, no associated intent, mild dysphoria and related symptoms, good self-control (both objective and subjective assessment), few other risk factors, and identifiable protective factors, including available and accessible social support.  PLAN OF CARE: admit to [facility]  I certify that inpatient services furnished can reasonably be expected to improve the patient's condition.   Hildred Priest, MD 09/02/2015, 2:10 PM

## 2015-09-03 DIAGNOSIS — F10230 Alcohol dependence with withdrawal, uncomplicated: Secondary | ICD-10-CM

## 2015-09-03 DIAGNOSIS — F1024 Alcohol dependence with alcohol-induced mood disorder: Principal | ICD-10-CM

## 2015-09-03 DIAGNOSIS — F10229 Alcohol dependence with intoxication, unspecified: Secondary | ICD-10-CM

## 2015-09-03 DIAGNOSIS — F3289 Other specified depressive episodes: Secondary | ICD-10-CM

## 2015-09-03 MED ORDER — CHLORDIAZEPOXIDE HCL 25 MG PO CAPS
25.0000 mg | ORAL_CAPSULE | Freq: Three times a day (TID) | ORAL | Status: DC
  Administered 2015-09-03 – 2015-09-04 (×4): 25 mg via ORAL
  Filled 2015-09-03 (×4): qty 1

## 2015-09-03 NOTE — Progress Notes (Signed)
[facility] MD Progress Note  09/03/2015 11:21 AM Ivan Hamilton  MRN:  [id number] Subjective:    Ivan Hamilton is a 50 y.o. male from [place] who presented to the ED on 2/8 under IVC for concerns with PTSD and acute alcohol intoxication. According to the IVC, pt has a previous diagnosis of PTSD and is not taking his medications as prescribed. He has been drinking a bottle of tequila per day.  He stated that he is not having any w/d symptoms and was recently d/c from [facility] after a week  admission. He stated that he has been applying for a job and has been laid off for a week.  He stated that he has good relationship with his family. He is calm and cooperative during the interview. He denied suicidal ideations or plans.    Principal Problem: Alcohol-induced depressive disorder with moderate or severe use disorder with onset during intoxication [facility]) Diagnosis:   Patient Active Problem List   Diagnosis Date Noted  . Alcohol withdrawal ([place]) [F10.239] 09/02/2015  . Alcohol use disorder, severe, dependence ([place]) [F10.20] 09/02/2015  . 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[place]) [F10.24, F10.229, F32.89] 09/02/2015  . PTSD (post-traumatic stress disorder) [F43.10] 09/01/2015  . Hypothyroid [E03.9] 09/01/2015  . Gastric reflux [K21.9] 09/01/2015  . DDD (degenerative disc disease), lumbosacral [M51.37] 09/01/2015   Total Time spent with patient: 30 minutes  Past Psychiatric History:   Past Medical History: History reviewed. No pertinent past medical history.  Past Surgical History  Procedure Laterality Date  . Leg surgery     Family History: History reviewed. No pertinent family history. Family Psychiatric  History:  Father was alcoholic.   Social History:  History  Alcohol Use  . Yes    Comment: daily     History  Drug Use No    Social History   Social History  . Marital Status: Married    Spouse Name:  N/A  . Number of Children: N/A  . Years of Education: N/A   Social History Main Topics  . Smoking status: Never Smoker   . Smokeless tobacco: None  . Alcohol Use: Yes     Comment: daily  . Drug Use: No  . Sexual Activity: Not Asked   Other Topics Concern  . None   Social History Narrative   Additional Social History:       Pt was recently d/c from [place] last week. He lives with his wife and 2 daughters.                   Sleep: Fair  Appetite:  Fair  Current Medications: Current Facility-Administered Medications  Medication Dose Route Frequency Provider Last Rate Last Dose  . acetaminophen (TYLENOL) tablet 650 mg  650 mg Oral Q6H PRN Gonzella Lex, MD   650 mg at 09/03/15 0733  . alum & mag hydroxide-simeth (MAALOX/MYLANTA) 200-200-20 MG/5ML suspension 30 mL  30 mL Oral Q4H PRN Gonzella Lex, MD      . chlordiazePOXIDE (LIBRIUM) capsule 25 mg  25 mg Oral TID Rainey Pines, MD      . FLUoxetine (PROZAC) capsule 20 mg  20 mg Oral Daily Hildred Priest, MD   20 mg at 09/03/15 0921  . gabapentin (NEURONTIN) capsule 400 mg  400 mg Oral TID Hildred Priest, MD   400 mg at 09/03/15 0920  . levothyroxine (SYNTHROID, LEVOTHROID) tablet 100 mcg  100 mcg Oral QAC breakfast Jenny Reichmann T  Clapacs, MD   100 mcg at 09/03/15 0704  . LORazepam (ATIVAN) tablet 2 mg  2 mg Oral TID PRN Hildred Priest, MD      . magnesium hydroxide (MILK OF MAGNESIA) suspension 30 mL  30 mL Oral Daily PRN Gonzella Lex, MD      . naltrexone (DEPADE) tablet 50 mg  50 mg Oral Daily Hildred Priest, MD   50 mg at 09/03/15 0955  . pantoprazole (PROTONIX) EC tablet 40 mg  40 mg Oral Daily Hildred Priest, MD   40 mg at 09/03/15 0921  . thiamine (VITAMIN B-1) tablet 100 mg  100 mg Oral Daily Gonzella Lex, MD   100 mg at 09/03/15 [id number]    Lab Results: No results found for this or any previous visit (from the past 48 hour(s)).  Physical Findings: AIMS:  , ,  ,  ,     CIWA:  CIWA-Ar Total: 1 COWS:     Musculoskeletal: Strength & Muscle Tone: within normal limits Gait & Station: normal Patient leans: N/A  Psychiatric Specialty Exam: ROS  Blood pressure 129/96, pulse 85, temperature 97.9 F (36.6 C), temperature source Oral, resp. rate 18, height 5\' 5"  (1.651 m), weight 138 lb (62.596 kg), SpO2 100 %.Body mass index is 22.96 kg/(m^2).  General Appearance: Casual  Eye Contact::  Fair  Speech:  Clear and Coherent  Volume:  Normal  Mood:  Anxious  Affect:  Congruent  Thought Process:  Coherent  Orientation:  Full (Time, Place, and Person)  Thought Content:  WDL  Suicidal Thoughts:  No  Homicidal Thoughts:  No  Memory:  Immediate;   Fair  Judgement:  Impaired  Insight:  Lacking  Psychomotor Activity:  Normal  Concentration:  Fair  Recall:  [place]  Language: Fair  Akathisia:  No  Handed:  Right  AIMS (if indicated):     Assets:  Communication Skills Desire for Improvement Housing Physical Health Social Support  ADL's:  Intact  Cognition: WNL  Sleep:  Number of Hours: 6.3   Treatment Plan Summary: Daily contact with patient to assess and evaluate symptoms and progress in treatment and Medication management   I will decrease his Librium to 25 mg by mouth 3 times a day. He is not having any withdrawal symptoms at this time. His mother is also visiting  from [place].  Will continue to monitor.    Rainey Pines, MD 09/03/2015, 11:21 AM

## 2015-09-03 NOTE — Plan of Care (Signed)
Problem: Alteration in mood & ability to function due to Goal: STG-Patient will comply with prescribed medication regimen (Patient will comply with prescribed medication regimen)  Outcome: Progressing Patient is compliant with all medications currently

## 2015-09-03 NOTE — BHH Counselor (Signed)
Adult Comprehensive Assessment  Patient ID: Ivan Hamilton, male   DOB: Feb 14, 1966, 50 y.o.   MRN: [id number]  Information Source: Information source: Patient  Current Stressors:  Educational / Learning stressors: Pt is starting GED classes soon.  Employment / Job issues: Pt recently lost job.  Family Relationships: None reported  Financial / Lack of resources (include bankruptcy): Limited income.  Housing / Lack of housing: None reported  Physical health (include injuries & life threatening diseases): None reported  Social relationships: None  reported  Substance abuse: Pt reports drinking a half of bottle of tequila   Living/Environment/Situation:  Living Arrangements: Spouse/significant other, Children Living conditions (as described by patient or guardian): Pt lives with wife and children.  How long has patient lived in current situation?: 13 years  What is atmosphere in current home: Supportive, Loving  Family History:  Marital status: Married Number of Years Married: 60 What types of issues is patient dealing with in the relationship?: Pt's drinking has caused problems within the marriage  Are you sexually active?: Yes What is your sexual orientation?: Heterosexual  Has your sexual activity been affected by drugs, alcohol, medication, or emotional stress?: None reported Does patient have children?: Yes How many children?: 2 How is patient's relationship with their children?: 2 daughters, ages 69 and 50. Close relationship   Childhood History:  By whom was/is the patient raised?: Both parents Description of patient's relationship with caregiver when they were a child: Good relationship.  Patient's description of current relationship with people who raised him/her: Mother lives in [place], father passed away a few years ago.  How were you disciplined when you got in trouble as a child/adolescent?: None reported  Does patient have siblings?: Yes Number of Siblings:  8 Description of patient's current relationship with siblings: 5 brothers, 3 sisters; "ok" relationships.  Did patient suffer any verbal/emotional/physical/sexual abuse as a child?: No Did patient suffer from severe childhood neglect?: No Has patient ever been sexually abused/assaulted/raped as an adolescent or adult?: No Was the patient ever a victim of a crime or a disaster?: No Witnessed domestic violence?: No Has patient been effected by domestic violence as an adult?: No  Education:  Highest grade of school patient has completed: Pt reports he graduated high school in [place] but is trying to get GED  Currently a student?: Yes Name of school: [employer] How long has the patient attended?: Starting GED classes on Monday.  Learning disability?: No  Employment/Work Situation:   Employment situation: Unemployed Patient's job has been impacted by current illness: No What is the longest time patient has a held a job?: 10 years  Where was the patient employed at that time?: [employer]  Has patient ever been in the [employer]?: No  Financial Resources:   Museum/gallery curator resources: Income from spouse, Medicaid Does patient have a representative payee or guardian?: No  Alcohol/Substance Abuse:   What has been your use of drugs/alcohol within the last 12 months?: Pt reports drinking half a bottle of Tequila.  Alcohol/Substance Abuse Treatment Hx: Past Tx, Inpatient If yes, describe treatment: [facility] 2016 Has alcohol/substance abuse ever caused legal problems?: No  Social Support System:   Patient's Community Support System: Good Describe Community Support System: family  Type of faith/religion: NA How does patient's faith help to cope with current illness?: NA   Leisure/Recreation:   Leisure and Hobbies: Pt reports he works all the time.   Strengths/Needs:   What things does the patient do well?: Pt  reports he is good a his job.  In what areas does patient struggle / problems for  patient: unemployment, alcohol abuse.   Discharge Plan:   Does patient have access to transportation?: Yes Will patient be returning to same living situation after discharge?: Yes Currently receiving community mental health services: No If no, would patient like referral for services when discharged?: Yes (What [place] ) Does patient have financial barriers related to discharge medications?: No  Summary/Recommendations:    Patient is a 50 year old male admitted with a diagnosis of Alcohol-induced depressive disorder. Patient presented to the hospital with alcohol use and depression. Patient reports primary triggers for admission were unemployment. Patient will benefit from crisis stabilization, medication evaluation, group therapy and psycho education in addition to case management for discharge. At discharge, it is recommended that patient remain compliant with established discharge plan and continued treatment.   [place]. MSW, [facility]  09/03/2015

## 2015-09-03 NOTE — BHH Group Notes (Signed)
[facility] Group Notes:  (Nursing/MHT/Case Management/Adjunct)  Date:  09/03/2015  Time:  10:02 AM  Type of Therapy:  Psychoeducational Skills  Participation Level:  Active  Participation Quality:  Appropriate  Affect:  Appropriate  Cognitive:  Appropriate  Insight:  Appropriate  Engagement in Group:  Engaged  Modes of Intervention:  Education  Summary of Progress/Problems:  Rinaldo Cloud 09/03/2015, 10:02 AM

## 2015-09-03 NOTE — Plan of Care (Signed)
Problem: Alteration in mood & ability to function due to Goal: LTG-Patient demonstrates decreased signs of withdrawal (Patient demonstrates decreased signs of withdrawal to the point the patient is safe to return home and continue treatment in an outpatient setting)  Outcome: Progressing Patient exhibits no symptoms of withdrawal at this time

## 2015-09-03 NOTE — Progress Notes (Signed)

## 2015-09-03 NOTE — BHH Group Notes (Signed)
[place] LCSW Group Therapy  09/03/2015 12:10 PM  Type of Therapy:  Group Therapy  Participation Level:  Minimal  Participation Quality:  Attentive  Affect:  Appropriate  Cognitive:  Alert  Insight:  Improving  Engagement in Therapy:  Improving  Modes of Intervention:  Discussion, Education, Socialization and Support  Summary of Progress/Problems:Balance in life: Patients will discuss the concept of balance and how it looks and feels to be unbalanced. Pt will identify areas in their life that is unbalanced and ways to become more balanced.  Pt attended group and stayed the entire time. He sat quietly and listened to other members share.   [place] MSW, LCSWA  09/03/2015, 12:10 PM

## 2015-09-03 NOTE — Progress Notes (Signed)
D: Pt denies SI/HI/AVH. Denied pain. Pleasant and cooperative. Pt stated he was looking forward to discharge. Seen interacting appropriately in milieu.  A: Support and encouragement provided. Medications given as prescribed. Q15 minute checks maintained for safety.  R: Receptive to interventions. Pleasant and cooperative. Remains safe on unit. Will continue to monitor.

## 2015-09-04 MED ORDER — CHLORDIAZEPOXIDE HCL 25 MG PO CAPS
25.0000 mg | ORAL_CAPSULE | Freq: Two times a day (BID) | ORAL | Status: DC
  Administered 2015-09-04 (×2): 25 mg via ORAL
  Filled 2015-09-04 (×2): qty 1

## 2015-09-04 NOTE — Progress Notes (Signed)
D: Observed pt in day room interacting with peers. Patient alert and oriented x4. . Patient denies SI/HI/AVH. Pt affect is bright. Complains of slight tremors but no s/s of withdrawl. Pt stated "I feel real good today." Pt was instructed on the disease concept of alcoholism and was extremely interested in the "allergy of the body that happens after he takes the first drink. A: Offered active listening and support. Provided therapeutic communication. Administered scheduled medications.  R: Pt pleasant and cooperative. Pt medication compliant. Will continue Q15 min. checks. Safety maintained.

## 2015-09-04 NOTE — BHH Group Notes (Signed)
[place] LCSW Group Therapy  09/04/2015 3:41 PM  Type of Therapy:  Group Therapy  Participation Level:  Minimal  Participation Quality:  Attentive  Affect:  Appropriate  Cognitive:  Alert  Insight:  Improving  Engagement in Therapy:  Improving  Modes of Intervention:  Discussion, Education, Socialization and Support  Summary of Progress/Problems: Todays topic: Grudges  Patients will be encouraged to discuss their thoughts, feelings, and behaviors as to why one holds on to grudges and reasons why people have grudges. Patients will process the impact of grudges on their daily lives and identify thoughts and feelings related to holding grudges. Patients will identify feelings and thoughts related to what life would look like without grudges. Pt attended group and stayed the entire time. He sat quietly and listened to other group members.   [place] MSW, [place]  09/04/2015, 3:41 PM

## 2015-09-04 NOTE — Progress Notes (Signed)
Pt complaining of URQ sharp pain after eating.  Report that he has had mult episodes of this. Denies N/V/D  Given Maalox and will cont to reassess.

## 2015-09-04 NOTE — Plan of Care (Signed)
Problem: Alteration in mood Goal: LTG-Patient reports reduction in suicidal thoughts (Patient reports reduction in suicidal thoughts and is able to verbalize a safety plan for whenever patient is feeling suicidal)  Outcome: Progressing Pt denies SI     

## 2015-09-04 NOTE — Progress Notes (Signed)
D: Observed pt interacting with peers and staff in day room. Patient alert and oriented x4. Patient denies SI/HI/AVH. Pt affect is appropriate and pleasant. Pt denies feeling depressed or anxious stating "Everything is fine." Pt was very social with staff, talking with them at length about his life and family. All conversation was appropriate. Pt was seen smiling and laughing. Pt complained of indigestion.  A: Offered active listening and support. Provided therapeutic communication. Administered scheduled medications. Gave mylanta prn. R: Pt pleasant and cooperative. Pt medication compliant. Will continue Q15 min. checks. Safety maintained.

## 2015-09-04 NOTE — Progress Notes (Signed)
[facility] MD Progress Note  09/04/2015 6:14 PM Ivan Hamilton  MRN:  [id number] Subjective:    Ivan Hamilton is a 50 y.o. male from [place] who presented to the ED on 2/8 under IVC for concerns with PTSD and acute alcohol intoxication.  Patient was seen for follow-up. He reported that he is doing better today and is not having any withdrawal symptoms and shakes. However he appears to have mild tremors in his hands. He reported that he slept well last night. He is tolerating the Librium well. He appeared calm and cooperative during the interview. He reported that he is planning to be discharged back home soon as he currently lives with his wife and 2 daughters. He reported that he is also applying for jobs as he has been laid off from his work. He currently denied having any perceptual disturbances. He denied having any suicidal homicidal ideations or plans. He is calm and cooperative during the interview.    Principal Problem: Alcohol-induced depressive disorder with moderate or severe use disorder with onset during intoxication [facility]) Diagnosis:   Patient Active Problem List   Diagnosis Date Noted  . Alcohol withdrawal ([place]) [F10.239] 09/02/2015  . Alcohol use disorder, severe, dependence ([place]) [F10.20] 09/02/2015  . 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[place]) [F10.24, F10.229, F32.89] 09/02/2015  . PTSD (post-traumatic stress disorder) [F43.10] 09/01/2015  . Hypothyroid [E03.9] 09/01/2015  . Gastric reflux [K21.9] 09/01/2015  . DDD (degenerative disc disease), lumbosacral [M51.37] 09/01/2015   Total Time spent with patient: 30 minutes  Past Psychiatric History:   Past Medical History: History reviewed. No pertinent past medical history.  Past Surgical History  Procedure Laterality Date  . Leg surgery     Family History: History reviewed. No pertinent family history. Family Psychiatric  History:  Father was  alcoholic.   Social History:  History  Alcohol Use  . Yes    Comment: daily     History  Drug Use No    Social History   Social History  . Marital Status: Married    Spouse Name: N/A  . Number of Children: N/A  . Years of Education: N/A   Social History Main Topics  . Smoking status: Never Smoker   . Smokeless tobacco: None  . Alcohol Use: Yes     Comment: daily  . Drug Use: No  . Sexual Activity: Not Asked   Other Topics Concern  . None   Social History Narrative   Additional Social History:       Pt was recently d/c from [place] last week. He lives with his wife and 2 daughters.                   Sleep: Fair  Appetite:  Fair  Current Medications: Current Facility-Administered Medications  Medication Dose Route Frequency Provider Last Rate Last Dose  . alum & mag hydroxide-simeth (MAALOX/MYLANTA) 200-200-20 MG/5ML suspension 30 mL  30 mL Oral Q4H PRN Gonzella Lex, MD   30 mL at 09/04/15 1812  . chlordiazePOXIDE (LIBRIUM) capsule 25 mg  25 mg Oral BID Rainey Pines, MD   25 mg at 09/04/15 1228  . FLUoxetine (PROZAC) capsule 20 mg  20 mg Oral Daily Hildred Priest, MD   20 mg at 09/04/15 0929  . gabapentin (NEURONTIN) capsule 400 mg  400 mg Oral TID Hildred Priest, MD   400 mg at 09/04/15 1620  . levothyroxine (SYNTHROID, LEVOTHROID) tablet 100 mcg  100 mcg Oral QAC breakfast Gonzella Lex, MD   100 mcg at 09/04/15 [phone]  . LORazepam (ATIVAN) tablet 2 mg  2 mg Oral TID PRN Hildred Priest, MD      . magnesium hydroxide (MILK OF MAGNESIA) suspension 30 mL  30 mL Oral Daily PRN Gonzella Lex, MD      . naltrexone (DEPADE) tablet 50 mg  50 mg Oral Daily Hildred Priest, MD   50 mg at 09/04/15 0929  . pantoprazole (PROTONIX) EC tablet 40 mg  40 mg Oral Daily Hildred Priest, MD   40 mg at 09/04/15 0932  . thiamine (VITAMIN B-1) tablet 100 mg  100 mg Oral Daily Gonzella Lex, MD   100 mg at 09/04/15 0932     Lab Results: No results found for this or any previous visit (from the past 80 hour(s)).  Physical Findings: AIMS:  , ,  ,  ,    CIWA:  CIWA-Ar Total: 0 COWS:     Musculoskeletal: Strength & Muscle Tone: within normal limits Gait & Station: normal Patient leans: N/A  Psychiatric Specialty Exam: ROS   Blood pressure 110/97, pulse 87, temperature 97.9 F (36.6 C), temperature source Oral, resp. rate 18, height 5\' 5"  (1.651 m), weight 138 lb (62.596 kg), SpO2 100 %.Body mass index is 22.96 kg/(m^2).  General Appearance: Casual  Eye Contact::  Fair  Speech:  Clear and Coherent  Volume:  Normal  Mood:  Anxious  Affect:  Congruent  Thought Process:  Coherent  Orientation:  Full (Time, Place, and Person)  Thought Content:  WDL  Suicidal Thoughts:  No  Homicidal Thoughts:  No  Memory:  Immediate;   Fair  Judgement:  Impaired  Insight:  Lacking  Psychomotor Activity:  Normal  Concentration:  Fair  Recall:  [place]  Language: Fair  Akathisia:  No  Handed:  Right  AIMS (if indicated):     Assets:  Communication Skills Desire for Improvement Housing Physical Health Social Support  ADL's:  Intact  Cognition: WNL  Sleep:  Number of Hours: 6.25   Treatment Plan Summary: Daily contact with patient to assess and evaluate symptoms and progress in treatment and Medication management   I will decrease his Librium to 25 mg by mouth 2 times a day. He is not having any withdrawal symptoms at this time. His mother is also visiting  from [place].  Will continue to monitor.    Rainey Pines, MD 09/04/2015, 6:14 PM

## 2015-09-05 MED ORDER — PANTOPRAZOLE SODIUM 40 MG PO TBEC: 40.0000 mg | DELAYED_RELEASE_TABLET | Freq: Every day | ORAL | Status: DC

## 2015-09-05 MED ORDER — GABAPENTIN 400 MG PO CAPS: 400.0000 mg | ORAL_CAPSULE | Freq: Three times a day (TID) | ORAL | Status: DC

## 2015-09-05 MED ORDER — FLUOXETINE HCL 20 MG PO CAPS: 20.0000 mg | ORAL_CAPSULE | Freq: Every day | ORAL | Status: DC

## 2015-09-05 MED ORDER — CHLORDIAZEPOXIDE HCL 25 MG PO CAPS
50.0000 mg | ORAL_CAPSULE | Freq: Once | ORAL | Status: AC
  Administered 2015-09-05: 50 mg via ORAL
  Filled 2015-09-05 (×2): qty 2

## 2015-09-05 MED ORDER — NALTREXONE HCL 50 MG PO TABS: 50.0000 mg | ORAL_TABLET | Freq: Every day | ORAL | Status: DC
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
## 2015-09-05 NOTE — BHH Suicide Risk Assessment (Signed)
[facility] Discharge Suicide Risk Assessment   Principal Problem: Alcohol-induced depressive disorder with moderate or severe use disorder with onset during intoxication [facility] Discharge Diagnoses:  Patient Active Problem List   Diagnosis Date Noted  . Alcohol withdrawal ([place]) [F10.239] 09/02/2015  . Alcohol use disorder, severe, dependence ([place]) [F10.20] 09/02/2015  . 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[place]) [F10.24, F10.229, F32.89] 09/02/2015  . PTSD (post-traumatic stress disorder) [F43.10] 09/01/2015  . Hypothyroid [E03.9] 09/01/2015  . Gastric reflux [K21.9] 09/01/2015  . DDD (degenerative disc disease), lumbosacral [M51.37] 09/01/2015    Total Time spent with patient: 30 minutes   Psychiatric Specialty Exam: ROS                                                         Mental Status Per Nursing Assessment::   On Admission:     Demographic Factors:  Male and Unemployed  Loss Factors: Financial problems/change in socioeconomic status  Historical Factors: h/o PTSD  Risk Reduction Factors:   Sense of responsibility to family, Religious beliefs about death, Employed, Living with another person, especially a relative and Positive social support  Continued Clinical Symptoms:  Alcohol/Substance Abuse/Dependencies Previous Psychiatric Diagnoses and Treatments  Cognitive Features That Contribute To Risk:  None    Suicide Risk:  Minimal: No identifiable suicidal ideation.  Patients presenting with no risk factors but with morbid ruminations; may be classified as minimal risk based on the severity of the depressive symptoms  Follow-up Information    Follow up with [facility] In 3 days.   Why:  Your hospital follow up appointment will be walk in. Walk in hours are Monday through Friday between 8:00am and 10:00am.    Contact information:   [address] [place]  [place] [address] [phone]        Hildred Priest, MD 09/05/2015, 8:44 AM

## 2015-09-05 NOTE — Progress Notes (Signed)
Patient denies SI/HI, denies A/V hallucinations. Patient verbalizes understanding of discharge instructions, follow up care and prescriptions. Patient given all belongings from  locker. Patient escorted out by staff, transported by family. 

## 2015-09-05 NOTE — Plan of Care (Signed)
Problem: Alteration in mood & ability to function due to Goal: STG: Patient verbalizes decreases in signs of withdrawal Outcome: Progressing Pt denies withdrawal symptoms.  Problem: Alteration in mood Goal: LTG-Pt's behavior demonstrates decreased signs of depression (Patient's behavior demonstrates decreased signs of depression to the point the patient is safe to return home and continue treatment in an outpatient setting)  Outcome: Progressing Pt affect is bright and appropriate to circumstance. Pt verbalizes decreased depression.

## 2015-09-05 NOTE — BHH Group Notes (Signed)
[facility] Group Notes:  (Nursing/MHT/Case Management/Adjunct)  Date:  09/05/2015  Time:  12:05 PM  Type of Therapy:  Psychoeducational Skills  Participation Level:  Did Not Attend   Lorane Gell 09/05/2015, 12:05 PM

## 2015-09-05 NOTE — Progress Notes (Signed)
D: Pt seen in milieu interacting appropriately with peers this evening. Pt affect is bright and appropriate. Pt laughs and jokes around with Probation officer. Denies SI/HI/AVH at this time. Denies pain. Pt states that after eating he has RUQ pain, refuses PRN medication at this time. Pt c/o anxiety and requests PRN Ativan. Pt states that he is worried about what he will do after discharge. He reports that he is currently unemployed and does not know how he will find a job in order to take care of his family financially. Pt states "It's nice being in here because I don't have to worry about any of that." A: Emotional support and encouragement provided. Medications administered with education. q15 minute safety checks maintained. R: Pt remains free from harm.

## 2015-09-05 NOTE — Progress Notes (Signed)
Recreation Therapy Notes  INPATIENT RECREATION TR PLAN  Patient Details Name: Jowell Bossi MRN: [id number] DOB: 07/03/1966 Today's Date: 09/05/2015  Rec Therapy Plan Is patient appropriate for Therapeutic Recreation?: Yes Treatment times per week: At least once a week TR Treatment/Interventions: 1:1 session, Group participation (Comment) (Appropriate participation in daily recreation therapy tx)  Discharge Criteria Pt will be discharged from therapy if:: Treatment goals are met, Discharged Treatment plan/goals/alternatives discussed and agreed upon by:: Patient/family  Discharge Summary Short term goals set: See Care Plan Short term goals met: Complete Progress toward goals comments: One-to-one attended One-to-one attended: Stress management, coping skills Reason goals not met: N/A Therapeutic equipment acquired: None Reason patient discharged from therapy: Discharge from hospital Pt/family agrees with progress & goals achieved: Yes Date patient discharged from therapy: 09/05/15   Leonette Monarch, LRT/CTRS 09/05/2015, 1:27 PM

## 2015-09-05 NOTE — BHH Suicide Risk Assessment (Signed)
[facility] INPATIENT:  Family/Significant Other Suicide Prevention Education  Suicide Prevention Education:  Education Completed; Barista, ex-wife,  (name of family member/significant other) has been identified by the patient as the family member/significant other with whom the patient will be residing, and identified as the person(s) who will aid the patient in the event of a mental health crisis (suicidal ideations/suicide attempt).  With written consent from the patient, the family member/significant other has been provided the following suicide prevention education, prior to the and/or following the discharge of the patient.  The suicide prevention education provided includes the following:  Suicide risk factors  Suicide prevention and interventions  National Suicide Hotline telephone number  [facility] assessment telephone number  [facility] Emergency Assistance [place] and/or Residential Mobile Crisis Unit telephone number  Request made of family/significant other to:  Remove weapons (e.g., guns, rifles, knives), all items previously/currently identified as safety concern.    Remove drugs/medications (over-the-counter, prescriptions, illicit drugs), all items previously/currently identified as a safety concern.  The family member/significant other verbalizes understanding of the suicide prevention education information provided.  The family member/significant other agrees to remove the items of safety concern listed above.  August Saucer, MSW, LCSW 09/05/2015, 4:11 PM

## 2015-09-05 NOTE — BHH Group Notes (Signed)
[facility] LCSW Aftercare Discharge Planning Group Note   09/05/2015 10:33 AM  Participation Quality:  Patient came to group late for the last 10 minutes but did not participate. Patient was attentive while he was in group but was called out by staff before facilitator could have patient share his SMART goals and introduce himself.     Keene Breath, MSW, LCSWA

## 2015-09-05 NOTE — Progress Notes (Signed)
  [facility] Adult Case Management Discharge Plan :  Will you be returning to the same living situation after discharge:  Yes,   home with family At discharge, do you have transportation home?: Yes,   ex-wife picked up Do you have the ability to pay for your medications: Yes,     Release of information consent forms completed and in the chart;  Patient's signature needed at discharge.  Patient to Follow up at: Follow-up Information    Follow up with [facility] In 3 days.   Why:  Your hospital follow up appointment will be walk in. Walk in hours are Monday through Friday between 8:00am and 10:00am.    Contact information:   [place]  [address] [phone]       Next level of care provider has access to [place] and Suicide Prevention discussed: Yes,     Have you used any form of tobacco in the last 30 days? (Cigarettes, Smokeless Tobacco, Cigars, and/or Pipes): No  Has patient been referred to the Quitline?: N/A patient is not a smoker  Patient has been referred for addiction treatment: Referred to sober Transitional housing program, Pt refused, Agreed to referral to [place] at [facility].  Agron Swiney P, MSw, LCSW 09/05/2015, 4:12 PM

## 2015-09-05 NOTE — Plan of Care (Signed)
Problem: [facility] Participation in Recreation Therapeutic Interventions Goal: STG-Patient will identify at least five coping skills for ** STG: Coping Skills - Within 3 treatment sessions, patient will verbalize at least 5 coping skills for substance abuse in one treatment session to decrease substance abuse post d/c.  Outcome: Completed/Met Date Met:  09/05/15 Treatment Session 1; Completed 1 out of 1: At approximately 11:50 am, LRT met with patient in patient room. Patient verbalized 5 coping skills for substance abuse. LRT educated patient on leisure and why it is important to implement it into his schedule. LRT educated and provided patient with blank schedules to help him plan his day and try to avoid using substances.  Intervention Used: Coping Skills worksheet  Leonette Monarch, LRT/CTRS 02.13.17 1:25 pm Goal: STG-Other Recreation Therapy Goal (Specify) STG: Stress Management - Within 3 treatment sessions, patient will verbalize understanding of the stress management techniques in one treatment session to increase stress management skills post d/c.  Outcome: Completed/Met Date Met:  09/05/15 Treatment Session 1; Completed 1 out of 1: At approximately 11:50 am, LRT met with patient in patient room. LRT educated and provided patient with handouts on stress management techniques. Patient verbalized understanding. LRT encouraged patient to read over and practice the stress management techniques. Intervention Used: Stress Management handouts  Leonette Monarch, LRT/CTRS 02.13.17 1:26 pm

## 2015-09-19 ENCOUNTER — Encounter: Payer: Self-pay | Admitting: *Deleted

## 2015-09-19 ENCOUNTER — Emergency Department
Admission: EM | Admit: 2015-09-19 | Discharge: 2015-09-20 | Disposition: A | Discharge status: Home / Self Care | Payer: Medicaid Other | Attending: Student | Admitting: Student

## 2015-09-19 DIAGNOSIS — F10229 Alcohol dependence with intoxication, unspecified: Secondary | ICD-10-CM | POA: Diagnosis not present

## 2015-09-19 DIAGNOSIS — F4312 Post-traumatic stress disorder, chronic: Secondary | ICD-10-CM | POA: Diagnosis present

## 2015-09-19 DIAGNOSIS — F10239 Alcohol dependence with withdrawal, unspecified: Secondary | ICD-10-CM | POA: Diagnosis present

## 2015-09-19 DIAGNOSIS — F3289 Other specified depressive episodes: Secondary | ICD-10-CM | POA: Diagnosis not present

## 2015-09-19 DIAGNOSIS — F1024 Alcohol dependence with alcohol-induced mood disorder: Secondary | ICD-10-CM | POA: Diagnosis not present

## 2015-09-19 DIAGNOSIS — F4310 Post-traumatic stress disorder, unspecified: Secondary | ICD-10-CM | POA: Diagnosis not present

## 2015-09-19 DIAGNOSIS — R45851 Suicidal ideations: Secondary | ICD-10-CM | POA: Diagnosis not present

## 2015-09-19 DIAGNOSIS — E039 Hypothyroidism, unspecified: Secondary | ICD-10-CM | POA: Diagnosis present

## 2015-09-19 DIAGNOSIS — Z79899 Other long term (current) drug therapy: Secondary | ICD-10-CM | POA: Diagnosis not present

## 2015-09-19 DIAGNOSIS — F10939 Alcohol use, unspecified with withdrawal, unspecified: Secondary | ICD-10-CM | POA: Diagnosis present

## 2015-09-19 DIAGNOSIS — F919 Conduct disorder, unspecified: Secondary | ICD-10-CM | POA: Diagnosis present

## 2015-09-19 DIAGNOSIS — F1310 Sedative, hypnotic or anxiolytic abuse, uncomplicated: Secondary | ICD-10-CM | POA: Diagnosis not present

## 2015-09-19 DIAGNOSIS — Z79891 Long term (current) use of opiate analgesic: Secondary | ICD-10-CM | POA: Insufficient documentation

## 2015-09-19 DIAGNOSIS — F1020 Alcohol dependence, uncomplicated: Secondary | ICD-10-CM | POA: Diagnosis present

## 2015-09-19 DIAGNOSIS — R000 Tachycardia, unspecified: Secondary | ICD-10-CM | POA: Diagnosis not present

## 2015-09-19 DIAGNOSIS — F10920 Alcohol use, unspecified with intoxication, uncomplicated: Secondary | ICD-10-CM

## 2015-09-19 DIAGNOSIS — F10220 Alcohol dependence with intoxication, uncomplicated: Secondary | ICD-10-CM | POA: Insufficient documentation

## 2015-09-19 LAB — CBC
HCT: 44 % (ref 40.0–52.0)
Hemoglobin: 14.9 g/dL (ref 13.0–18.0)
MCH: 30.4 pg (ref 26.0–34.0)
MCHC: 33.8 g/dL (ref 32.0–36.0)
MCV: 90 fL (ref 80.0–100.0)
Platelets: 175 10*3/uL (ref 150–440)
RBC: 4.89 MIL/uL (ref 4.40–5.90)
RDW: 14.7 % — ABNORMAL HIGH (ref 11.5–14.5)
WBC: 10.3 10*3/uL (ref 3.8–10.6)

## 2015-09-19 LAB — URINE DRUG SCREEN, QUALITATIVE (ARMC ONLY)
Amphetamines, Ur Screen: NOT DETECTED
Barbiturates, Ur Screen: NOT DETECTED
Benzodiazepine, Ur Scrn: POSITIVE — AB
Cannabinoid 50 Ng, Ur ~~LOC~~: NOT DETECTED
Cocaine Metabolite,Ur ~~LOC~~: NOT DETECTED
MDMA (Ecstasy)Ur Screen: NOT DETECTED
Methadone Scn, Ur: NOT DETECTED
Opiate, Ur Screen: NOT DETECTED
Phencyclidine (PCP) Ur S: NOT DETECTED
Tricyclic, Ur Screen: NOT DETECTED

## 2015-09-19 LAB — COMPREHENSIVE METABOLIC PANEL
ALT: 16 U/L — ABNORMAL LOW (ref 17–63)
AST: 30 U/L (ref 15–41)
Albumin: 4.8 g/dL (ref 3.5–5.0)
Alkaline Phosphatase: 65 U/L (ref 38–126)
Anion gap: 15 (ref 5–15)
BUN: 10 mg/dL (ref 6–20)
CO2: 22 mmol/L (ref 22–32)
Calcium: 8.9 mg/dL (ref 8.9–10.3)
Chloride: 105 mmol/L (ref 101–111)
Creatinine, Ser: 0.9 mg/dL (ref 0.61–1.24)
GFR calc Af Amer: >60 mL/min (ref >60)
GFR calc non Af Amer: >60 mL/min (ref >60)
Glucose, Bld: 93 mg/dL (ref 65–99)
Potassium: 3.9 mmol/L (ref 3.5–5.1)
Sodium: 142 mmol/L (ref 135–145)
Total Bilirubin: 0.7 mg/dL (ref 0.3–1.2)
Total Protein: 8.9 g/dL — ABNORMAL HIGH (ref 6.5–8.1)

## 2015-09-19 LAB — ETHANOL: Alcohol, Ethyl (B): 337 mg/dL (ref <5)

## 2015-09-19 NOTE — ED Provider Notes (Signed)
[facility] Emergency Department Provider Note  ____________________________________________  Time seen: Approximately 9:21 PM  I have reviewed the triage vital signs and the nursing notes.   HISTORY  Chief Complaint Behavior Problem    HPI Ivan Hamilton is a 50 y.o. 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 The patient reports that he has been depressed for some time because he's been unable to find a job. He denies any homicidal ideation or audiovisual hallucinations. He was hospitalized here earlier this month for worsening suicidal ideation in the setting of alcohol use. He denies any recent illness including no chest pain, difficulty breathing, nausea, vomiting, diarrhea, fevers or chills.   No past medical history on file.  Patient Active Problem List   Diagnosis Date Noted  . Alcohol withdrawal ([place]) 09/02/2015  . Alcohol use disorder, severe, dependence ([place]) 09/02/2015  . 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[place]) 09/02/2015  . PTSD (post-traumatic stress disorder) 09/01/2015  . Hypothyroid 09/01/2015  . Gastric reflux 09/01/2015  . DDD (degenerative disc disease), lumbosacral 09/01/2015    Past Surgical History  Procedure Laterality Date  . Leg surgery      Current Outpatient Rx  Name  Route  Sig  Dispense  Refill  . FLUoxetine (PROZAC) 20 MG capsule   Oral   Take 1 capsule (20 mg total) by mouth daily.   30 capsule   0   . gabapentin (NEURONTIN) 400 MG capsule   Oral   Take 1 capsule (400 mg total) by mouth 3 (three) times daily.   90 capsule   0   . levothyroxine (SYNTHROID, LEVOTHROID) 100 MCG tablet   Oral   Take 100 mcg by mouth daily before breakfast.         . naltrexone (DEPADE) 50 MG  tablet   Oral   Take 1 tablet (50 mg total) by mouth daily.   30 tablet   0   . pantoprazole (PROTONIX) 40 MG tablet   Oral   Take 1 tablet (40 mg total) by mouth daily.   30 tablet   0     Allergies Review of patient's allergies indicates no known allergies.  No family history on file.  Social History Social History  Substance Use Topics  . Smoking status: Never Smoker   . Smokeless tobacco: None  . Alcohol Use: Yes     Comment: daily    Review of Systems Constitutional: No fever/chills Eyes: No visual changes. ENT: No sore throat. Cardiovascular: Denies chest pain. Respiratory: Denies shortness of breath. Gastrointestinal: No abdominal pain.  No nausea, no vomiting.  No diarrhea.  No constipation. Genitourinary: Negative for dysuria. Musculoskeletal: Negative for back pain. Skin: Negative for rash. Neurological: Negative for headaches, focal weakness or numbness.  10-point ROS otherwise negative.  ____________________________________________   PHYSICAL EXAM:  VITAL SIGNS: ED Triage Vitals  Enc Vitals Group     BP 09/19/15 2046 141/97 mmHg     Pulse Rate 09/19/15 2046 122     Resp 09/19/15 2046 22     Temp 09/19/15 2046 98.7 F (37.1 C)     Temp Source 09/19/15 2046 Oral     SpO2 09/19/15 2046 97 %     Weight 09/19/15 2046 134 lb (60.782 kg)     Height 09/19/15 2046 5\' 5"  (1.651 m)  Head Cir --      Peak Flow --      Pain Score --      Pain Loc --      Pain Edu? --      Excl. in [place]? --     Constitutional: Alert and oriented. Appears mildly intoxicated and smells of alcohol however answers all questions appropriately, follows commands, is jovial and cooperative on exam. Eyes: Conjunctivae are injected. PERRL. EOMI. Head: Atraumatic. Nose: No congestion/rhinnorhea. Mouth/Throat: Mucous membranes are moist.  Oropharynx non-erythematous. Neck: No stridor.   Cardiovascular: Tachycardic rate, regular rhythm. Grossly normal heart sounds.  Good  peripheral circulation. Respiratory: Normal respiratory effort.  No retractions. Lungs CTAB. Gastrointestinal: Soft and nontender. No distention.  No CVA tenderness. Genitourinary: Deferred Musculoskeletal: No lower extremity tenderness nor edema.  No joint effusions. Neurologic:  Normal speech and language. No gross focal neurologic deficits are appreciated. No gait instability. Skin:  Skin is warm, dry and intact. No rash noted. Psychiatric: Mood and affect are normal. Speech and behavior are normal.  ____________________________________________   LABS (all labs ordered are listed, but only abnormal results are displayed)  Labs Reviewed  COMPREHENSIVE METABOLIC PANEL - Abnormal; Notable for the following:    Total Protein 8.9 (*)    ALT 16 (*)    All other components within normal limits  ETHANOL - Abnormal; Notable for the following:    Alcohol, Ethyl (B) 337 (*)    All other components within normal limits  CBC - Abnormal; Notable for the following:    RDW 14.7 (*)    All other components within normal limits  URINE DRUG SCREEN, QUALITATIVE ([facility] ONLY) - Abnormal; Notable for the following:    Benzodiazepine, Ur Scrn POSITIVE (*)    All other components within normal limits   ____________________________________________  EKG  none ____________________________________________  RADIOLOGY  none ____________________________________________   PROCEDURES  Procedure(s) performed: None  Critical Care performed: No  ____________________________________________   INITIAL IMPRESSION / ASSESSMENT AND PLAN / ED COURSE  Pertinent labs & imaging results that were available during my care of the patient were reviewed by me and considered in my medical decision making (see chart for details).  Ivan Hamilton is a 50 y.o. 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 On exam, he is nontoxic appearing and in no acute distress. He does appear mildly intoxicated but is cooperative, alert, oriented. Mildly tachycardic on arrival but otherwise his vital signs are stable and he is afebrile. We will continue to monitor his heart rate. CMP generally unremarkable. Ethanol level is elevated at 337. CBC unremarkable. Urine drug screen positive for benzos. We'll continue involuntary commitment, consult TTS and psychiatry. Care transferred to Dr. Beather Arbour at 11:16 PM. ____________________________________________   FINAL CLINICAL IMPRESSION(S) / ED DIAGNOSES  Final diagnoses:  Suicidal ideation  Alcohol intoxication, uncomplicated (HCC)      Joanne Gavel, MD 09/19/15 2317

## 2015-09-19 NOTE — ED Notes (Signed)
Pt brought in via bpd.  Pt is IVC.  Pt drinks everyday.  Pt denies SI or HI.  Pt calm and cooperative.

## 2015-09-19 NOTE — ED Notes (Signed)
BEHAVIORAL HEALTH ROUNDING  Patient sleeping: NO  Patient alert and oriented: YES  Behavior appropriate: YES  Describe behavior: No inappropriate or unacceptable behaviors noted at this time.  Nutrition and fluids offered: No  Toileting and hygiene offered: No  Sitter present: Behavioral tech rounding every 15 minutes on patient to ensure safety.  Law enforcement present: YES  Law enforcement agency: [employer] ([employer])  

## 2015-09-20 DIAGNOSIS — F3289 Other specified depressive episodes: Secondary | ICD-10-CM

## 2015-09-20 DIAGNOSIS — F10229 Alcohol dependence with intoxication, unspecified: Secondary | ICD-10-CM | POA: Diagnosis not present

## 2015-09-20 DIAGNOSIS — F1024 Alcohol dependence with alcohol-induced mood disorder: Secondary | ICD-10-CM

## 2015-09-20 MED ORDER — THIAMINE HCL 100 MG/ML IJ SOLN: 100.0000 mg | Freq: Every day | INTRAMUSCULAR | Status: DC

## 2015-09-20 MED ORDER — LORAZEPAM 1 MG PO TABS
1.0000 mg | ORAL_TABLET | Freq: Once | ORAL | Status: AC
  Administered 2015-09-20: 1 mg via ORAL

## 2015-09-20 MED ORDER — LORAZEPAM 2 MG PO TABS
0.0000 mg | ORAL_TABLET | Freq: Two times a day (BID) | ORAL | Status: DC
Start: 2015-09-20 — End: 2015-09-20

## 2015-09-20 MED ORDER — LORAZEPAM 1 MG PO TABS
ORAL_TABLET | ORAL | Status: AC
  Administered 2015-09-20: 1 mg via ORAL
  Filled 2015-09-20: qty 1

## 2015-09-20 MED ORDER — LORAZEPAM 1 MG PO TABS
ORAL_TABLET | ORAL | Status: AC
  Filled 2015-09-20: qty 1

## 2015-09-20 MED ORDER — LORAZEPAM 2 MG PO TABS
0.0000 mg | ORAL_TABLET | Freq: Four times a day (QID) | ORAL | Status: DC
  Administered 2015-09-20: 1 mg via ORAL

## 2015-09-20 MED ORDER — LEVOTHYROXINE SODIUM 100 MCG PO TABS
100.0000 ug | ORAL_TABLET | Freq: Every day | ORAL | Status: DC
  Administered 2015-09-20: 100 ug via ORAL
  Filled 2015-09-20 (×2): qty 1

## 2015-09-20 MED ORDER — VITAMIN B-1 100 MG PO TABS: 100.0000 mg | ORAL_TABLET | Freq: Every day | ORAL | Status: DC

## 2015-09-20 MED ORDER — CHLORDIAZEPOXIDE HCL 25 MG PO CAPS
50.0000 mg | ORAL_CAPSULE | ORAL | Status: AC
  Administered 2015-09-20: 50 mg via ORAL

## 2015-09-20 MED ORDER — LORAZEPAM 2 MG/ML IJ SOLN: 0.0000 mg | Freq: Four times a day (QID) | INTRAMUSCULAR | Status: DC

## 2015-09-20 MED ORDER — CHLORDIAZEPOXIDE HCL 25 MG PO CAPS
ORAL_CAPSULE | ORAL | Status: DC
Start: 2015-09-20 — End: 2015-09-20
  Filled 2015-09-20: qty 2

## 2015-09-20 MED ORDER — LORAZEPAM 2 MG/ML IJ SOLN: 0.0000 mg | Freq: Two times a day (BID) | INTRAMUSCULAR | Status: DC

## 2015-09-20 NOTE — ED Notes (Signed)

## 2015-09-20 NOTE — ED Notes (Signed)
Pt. Noted in room. No complaints or concerns voiced. No distress or abnormal behavior noted. Will continue to monitor with security cameras. Q 15 minute rounds continue.,pt states he is feeling better

## 2015-09-20 NOTE — ED Notes (Signed)
Pt states he is missing a wallet and a belt . There was none in his belongings,he will call when he gets home if it is actually missing

## 2015-09-20 NOTE — ED Notes (Signed)
BEHAVIORAL HEALTH ROUNDING  Patient sleeping: NO  Patient alert and oriented: YES  Behavior appropriate: YES  Describe behavior: No inappropriate or unacceptable behaviors noted at this time.  Nutrition and fluids offered: No  Toileting and hygiene offered: No  Sitter present: Behavioral tech rounding every 15 minutes on patient to ensure safety.  Law enforcement present: YES  Law enforcement agency: [employer] ([employer])  

## 2015-09-20 NOTE — ED Notes (Signed)
Pt c/o severe tremors and anxiety but is very pleasant

## 2015-09-20 NOTE — ED Notes (Signed)
I noticed patient shaking, notified MD and verbal order given for 1mg  Ativan.

## 2015-09-20 NOTE — ED Notes (Signed)
Pt to be dc home with family

## 2015-09-20 NOTE — ED Notes (Signed)
Pt in day room watching tv, states he feels better

## 2015-09-20 NOTE — ED Provider Notes (Signed)
-----------------------------------------   7:47 AM on 09/20/2015 -----------------------------------------   Blood pressure 131/87, pulse 111, temperature 98.1 F (36.7 C), temperature source Oral, resp. rate 20, height 5\' 5"  (1.651 m), weight 134 lb (60.782 kg), SpO2 95 %.  Place patient on CIWA protocol secondary to patient restless with tremors approximately 6:45 AM.  Calm and cooperative at this time.  Disposition is pending per Psychiatry/Behavioral Medicine team recommendations.     Paulette Blanch, MD 09/20/15 [phone]

## 2015-09-20 NOTE — ED Notes (Signed)
Called charge and nurse already spoke with Dr Clearnce Hasten and given update on pt  condition

## 2015-09-20 NOTE — ED Provider Notes (Signed)
Filed Vitals:   09/20/15 1449 09/20/15 1450  BP: 140/100 140/100  Pulse: 116 116  Temp: 98.6 F (37 C)   Resp: 20    Patient's been cleared by Dr. Weber Cooks for discharge.   Earleen Newport, MD 09/20/15 [phone]

## 2015-09-20 NOTE — Discharge Instructions (Signed)
Suicidal Feelings: How to Help Yourself °Suicide is the taking of one's own life. If you feel as though life is getting too tough to handle and are thinking about suicide, get help right away. To get help: °· Call your local emergency services (911 in the [place] °· Call a suicide hotline to speak with a trained counselor who understands how you are feeling. The following is a list of suicide hotlines in the [place]. For a list of hotlines in [place], visit www.suicide.org/hotlines/international/[place]-suicide-hotlines.html. °¨  1-800-273-TALK (1-800-273-8255). °¨  1-800-SUICIDE (1-800-784-2433). °¨  1-888-628-9454. This is a hotline for Spanish speakers. °¨  1-800-799-4TTY (1-800-799-4889). This is a hotline for TTY users. °¨  1-866-4-U-TREVOR (1-866-488-7386). This is a hotline for lesbian, gay, bisexual, transgender, or questioning youth. °· Contact a crisis center or a local suicide prevention center. To find a crisis center or suicide prevention center: °¨ Call your local hospital, clinic, community service organization, mental health center, social service provider, or health department. Ask for assistance in connecting to a crisis center. °¨ Visit www.suicidepreventionlifeline.org/getinvolved/locator for a list of crisis centers in the [place], or visit www.suicideprevention.[place]/thinking-about-suicide/find-a-crisis-centre for a list of centers in [place]. °· Visit the following websites: °¨  National Suicide Prevention Lifeline: www.suicidepreventionlifeline.org °¨  Hopeline: www.hopeline.com °¨  American Foundation for Suicide Prevention: www.afsp.org °¨  The Trevor Project (for lesbian, gay, bisexual, transgender, or questioning youth): www.thetrevorproject.org °HOW CAN I HELP MYSELF FEEL BETTER? °· Promise yourself that you will not do anything drastic when you have suicidal feelings. Remember, there is hope. Many people have gotten through suicidal thoughts and feelings, and you will, too. You may  have gotten through them before, and this proves that you can get through them again. °· Let family, friends, teachers, or counselors know how you are feeling. Try not to isolate yourself from those who care about you. Remember, they will want to help you. Talk with someone every day, even if you do not feel sociable. Face-to-face conversation is best. °· Call a mental health professional and see one regularly. °· Visit your primary health care provider every year. °· Eat a well-balanced diet, and space your meals so you eat regularly. °· Get plenty of rest. °· Avoid alcohol and drugs, and remove them from your home. They will only make you feel worse. °· If you are thinking of taking a lot of medicine, give your medicine to someone who can give it to you one day at a time. If you are on antidepressants and are concerned you will overdose, let your health care provider know so he or she can give you safer medicines. Ask your mental health professional about the possible side effects of any medicines you are taking. °· Remove weapons, poisons, knives, and anything else that could harm you from your home. °· Try to stick to routines. Follow a schedule every day. Put self-care on your schedule. °· Make a list of realistic goals, and cross them off when you achieve them. Accomplishments give a sense of worth. °· Wait until you are feeling better before doing the things you find difficult or unpleasant. °· Exercise if you are able. You will feel better if you exercise for even a half hour each day. °· Go out in the sun or into nature. This will help you recover from depression faster. If you have a favorite place to walk, go there. °· Do the things that have always given you pleasure. Play your favorite music, read a good book, paint a picture, play your favorite instrument, or do anything   else that takes your mind off your depression if it is safe to do.  Keep your living space well lit.  When you are feeling well,  write yourself a letter about tips and support that you can read when you are not feeling well.  Remember that life's difficulties can be sorted out with help. Conditions can be treated. You can work on thoughts and strategies that serve you well.   This information is not intended to replace advice given to you by your health care provider. Make sure you discuss any questions you have with your health care provider.   Document Released: 01/13/2003 Document Revised: 07/30/2014 Document Reviewed: 11/03/2013 Elsevier Interactive Patient Education 2016 [employer]. Alcohol Use Disorder Alcohol use disorder is a mental disorder. It is not a one-time incident of heavy drinking. Alcohol use disorder is the excessive and uncontrollable use of alcohol over time that leads to problems with functioning in one or more areas of daily living. People with this disorder risk harming themselves and others when they drink to excess. Alcohol use disorder also can cause other mental disorders, such as mood and anxiety disorders, and serious physical problems. People with alcohol use disorder often misuse other drugs.  Alcohol use disorder is common and widespread. Some people with this disorder drink alcohol to cope with or escape from negative life events. Others drink to relieve chronic pain or symptoms of mental illness. People with a family history of alcohol use disorder are at higher risk of losing control and using alcohol to excess.  Drinking too much alcohol can cause injury, accidents, and health problems. One drink can be too much when you are:  Working.  Pregnant or breastfeeding.  Taking medicines. Ask your doctor.  Driving or planning to drive. SYMPTOMS  Signs and symptoms of alcohol use disorder may include the following:   Consumption ofalcohol inlarger amounts or over a longer period of time than intended.  Multiple unsuccessful attempts to cutdown or control alcohol use.   A great  deal of time spent obtaining alcohol, using alcohol, or recovering from the effects of alcohol (hangover).  A strong desire or urge to use alcohol (cravings).   Continued use of alcohol despite problems at work, school, or home because of alcohol use.   Continued use of alcohol despite problems in relationships because of alcohol use.  Continued use of alcohol in situations when it is physically hazardous, such as driving a car.  Continued use of alcohol despite awareness of a physical or psychological problem that is likely related to alcohol use. Physical problems related to alcohol use can involve the brain, heart, liver, stomach, and intestines. Psychological problems related to alcohol use include intoxication, depression, anxiety, psychosis, delirium, and dementia.   The need for increased amounts of alcohol to achieve the same desired effect, or a decreased effect from the consumption of the same amount of alcohol (tolerance).  Withdrawal symptoms upon reducing or stopping alcohol use, or alcohol use to reduce or avoid withdrawal symptoms. Withdrawal symptoms include:  Racing heart.  Hand tremor.  Difficulty sleeping.  Nausea.  Vomiting.  Hallucinations.  Restlessness.  Seizures. DIAGNOSIS Alcohol use disorder is diagnosed through an assessment by your health care provider. Your health care provider may start by asking three or four questions to screen for excessive or problematic alcohol use. To confirm a diagnosis of alcohol use disorder, at least two symptoms must be present within a 68-month period. The severity of alcohol use disorder depends on  the number of symptoms:  Mild--two or three.  Moderate--four or five.  Severe--six or more. Your health care provider may perform a physical exam or use results from lab tests to see if you have physical problems resulting from alcohol use. Your health care provider may refer you to a mental health professional for  evaluation. TREATMENT  Some people with alcohol use disorder are able to reduce their alcohol use to low-risk levels. Some people with alcohol use disorder need to quit drinking alcohol. When necessary, mental health professionals with specialized training in substance use treatment can help. Your health care provider can help you decide how severe your alcohol use disorder is and what type of treatment you need. The following forms of treatment are available:   Detoxification. Detoxification involves the use of prescription medicines to prevent alcohol withdrawal symptoms in the first week after quitting. This is important for people with a history of symptoms of withdrawal and for heavy drinkers who are likely to have withdrawal symptoms. Alcohol withdrawal can be dangerous and, in severe cases, cause death. Detoxification is usually provided in a hospital or in-patient substance use treatment facility.  Counseling or talk therapy. Talk therapy is provided by substance use treatment counselors. It addresses the reasons people use alcohol and ways to keep them from drinking again. The goals of talk therapy are to help people with alcohol use disorder find healthy activities and ways to cope with life stress, to identify and avoid triggers for alcohol use, and to handle cravings, which can cause relapse.  Medicines.Different medicines can help treat alcohol use disorder through the following actions:  Decrease alcohol cravings.  Decrease the positive reward response felt from alcohol use.  Produce an uncomfortable physical reaction when alcohol is used (aversion therapy).  Support groups. Support groups are run by people who have quit drinking. They provide emotional support, advice, and guidance. These forms of treatment are often combined. Some people with alcohol use disorder benefit from intensive combination treatment provided by specialized substance use treatment centers. Both inpatient and  outpatient treatment programs are available.   This information is not intended to replace advice given to you by your health care provider. Make sure you discuss any questions you have with your health care provider.   Document Released: 08/16/2004 Document Revised: 07/30/2014 Document Reviewed: 10/16/2012 Elsevier Interactive Patient Education [employer].

## 2015-09-20 NOTE — ED Notes (Signed)
Family called about discharge and his daughter gets off work at 1 pm and will come and get him
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
## 2015-09-20 NOTE — BH Assessment (Signed)
Assessment Note  Ivan Hamilton is an 50 y.o. male. Mr. Stoessel reports that he came to the ED by way of the police officer. He states that he has no ideal why he is here.  He states that the officer states that he has been drinking and that he was taking him to the hospital.  He reports that he did nothing to his family. He denied symptoms of depression or anxiety.  He denied auditory or visual hallucinations. He denied suicidal or homicidal ideation or intent.  He reports drinking alcohol. He states he had 3 shots of tequila. He states no drugs were used. UDS resulted in positives for benzodiazepines. BAL= 337.  Diagnosis:  Past Medical History: No past medical history on file.  Past Surgical History  Procedure Laterality Date  . Leg surgery      Family History: No family history on file.  Social History:  reports that he has never smoked. He does not have any smokeless tobacco history on file. He reports that he drinks alcohol. He reports that he does not use illicit drugs.  Additional Social History:  Alcohol / Drug Use History of alcohol / drug use?: Yes Substance #1 Name of Substance 1: Alcohol 1 - Age of First Use: 3  1 - Amount (size/oz): pint of tequila 1 - Frequency: daily 1 - Last Use / Amount: 09/19/2015  CIWA: CIWA-Ar BP: (!) 141/97 mmHg Pulse Rate: (!) 122 Nausea and Vomiting: no nausea and no vomiting Tactile Disturbances: none Tremor: no tremor Auditory Disturbances: not present Paroxysmal Sweats: no sweat visible Visual Disturbances: not present Anxiety: no anxiety, at ease Headache, Fullness in Head: none present Agitation: normal activity Orientation and Clouding of Sensorium: oriented and can do serial additions CIWA-Ar Total: 0 COWS:    Allergies: No Known Allergies  Home Medications:  (Not in a hospital admission)  OB/GYN Status:  No LMP for male patient.  General Assessment Data Location of Assessment: [facility] ED TTS Assessment: In system Is  this a Tele or Face-to-Face Assessment?: Face-to-Face Is this an Initial Assessment or a Re-assessment for this encounter?: Initial Assessment Marital status: Married [place] name: n/a Is patient pregnant?: No Pregnancy Status: No Living Arrangements: Spouse/significant other, Children Can pt return to current living arrangement?: Yes Admission Status: Involuntary Is patient capable of signing voluntary admission?: Yes Referral Source: Self/Family/Friend Insurance type: Medicaid  Medical Screening Exam ([place]) Medical Exam completed: Yes  Crisis Care Plan Living Arrangements: Spouse/significant other, Children Legal Guardian: Other: (Self) Name of Psychiatrist:  (None) Name of Therapist:  (None)  Education Status Is patient currently in school?: Yes Current Grade: n/a Highest grade of school patient has completed: Graduated from [employer] in [place] Name of school: n/a Sport and exercise psychologist person: n/a  Risk to self with the past 6 months Suicidal Ideation: No-Not Currently/Within Last 6 Months (denies suicidal ideation) Has patient been a risk to self within the past 6 months prior to admission? : No Suicidal Intent: No Has patient had any suicidal intent within the past 6 months prior to admission? : No Is patient at risk for suicide?: No Suicidal Plan?: No Has patient had any suicidal plan within the past 6 months prior to admission? : No Access to Means: No What has been your use of drugs/alcohol within the last 12 months?: excessive use of alcohol Previous Attempts/Gestures: No How many times?: 0 Other Self Harm Risks: 0 Triggers for Past Attempts: None known Intentional Self Injurious Behavior: None Family Suicide History:  No Recent stressful life event(s): Job Loss Persecutory voices/beliefs?: No Depression: No (denied) Depression Symptoms: Feeling worthless/self pity Substance abuse history and/or treatment for substance abuse?: Yes Suicide prevention  information given to non-admitted patients: Not applicable  Risk to Others within the past 6 months Homicidal Ideation: No Does patient have any lifetime risk of violence toward others beyond the six months prior to admission? : No Thoughts of Harm to Others: No Current Homicidal Intent: No Current Homicidal Plan: No Access to Homicidal Means: No Identified Victim: denied History of harm to others?: No Assessment of Violence: None Noted Violent Behavior Description: denied Does patient have access to weapons?: No Criminal Charges Pending?: No Does patient have a court date: No Is patient on probation?: No  Psychosis Hallucinations: None noted Delusions: None noted  Mental Status Report Appearance/Hygiene: In scrubs, Unremarkable Eye Contact: Good Motor Activity: Unremarkable Speech: Loud, Tangential, Logical/coherent Level of Consciousness: Alert Mood: Pleasant Affect: Appropriate to circumstance Anxiety Level: None Thought Processes: Tangential Judgement: Partial Orientation: Person, Place, Situation Obsessive Compulsive Thoughts/Behaviors: None  Cognitive Functioning Concentration: Normal Memory: Recent Intact IQ: Average Insight: Poor Impulse Control: Fair Appetite: Good Sleep: No Change Vegetative Symptoms: None  ADLScreening [facility] Assessment Services) Patient's cognitive ability adequate to safely complete daily activities?: Yes Patient able to express need for assistance with ADLs?: Yes Independently performs ADLs?: Yes (appropriate for developmental age)  Prior Inpatient Therapy Prior Inpatient Therapy: Yes Prior Therapy Dates: 05/2015, 08/2015 Prior Therapy Facilty/Provider(s): [facility] Reason for Treatment: depression, alcohol  Prior Outpatient Therapy Prior Outpatient Therapy: No Prior Therapy Dates: N/A Prior Therapy Facilty/Provider(s): N/A Reason for Treatment: N/A Does patient have an ACCT team?: No Does patient have Intensive In-House Services?   : No Does patient have [facility] services? : No Does patient have P4CC services?: No  ADL Screening (condition at time of admission) Patient's cognitive ability adequate to safely complete daily activities?: Yes Patient able to express need for assistance with ADLs?: Yes Independently performs ADLs?: Yes (appropriate for developmental age)       Abuse/Neglect Assessment (Assessment to be complete while patient is alone) Physical Abuse: Denies Verbal Abuse: Denies Sexual Abuse: Denies Exploitation of patient/patient's resources: Denies Self-Neglect: Denies Values / Beliefs Cultural Requests During Hospitalization: None Spiritual Requests During Hospitalization: None   Advance Directives (For Healthcare) Does patient have an advance directive?: No    Additional Information 1:1 In Past 12 Months?: No CIRT Risk: No Elopement Risk: No Does patient have medical clearance?: No     Disposition:  Disposition Initial Assessment Completed for this Encounter: Yes Disposition of Patient: Other dispositions  On Site Evaluation by:   Reviewed with Physician:    Elmer Bales 09/20/2015 12:26 AM

## 2015-11-02 ENCOUNTER — Encounter: Payer: Self-pay | Admitting: Medical Oncology

## 2015-11-02 ENCOUNTER — Emergency Department: Payer: Medicaid Other

## 2015-11-02 ENCOUNTER — Inpatient Hospital Stay
Admission: EM | Admit: 2015-11-02 | Discharge: 2015-11-05 | DRG: 897 | Disposition: A | Discharge status: Home / Self Care | Payer: Medicaid Other | Attending: Internal Medicine | Admitting: Internal Medicine

## 2015-11-02 DIAGNOSIS — F10939 Alcohol use, unspecified with withdrawal, unspecified: Secondary | ICD-10-CM | POA: Diagnosis present

## 2015-11-02 DIAGNOSIS — K219 Gastro-esophageal reflux disease without esophagitis: Secondary | ICD-10-CM | POA: Diagnosis present

## 2015-11-02 DIAGNOSIS — E538 Deficiency of other specified B group vitamins: Secondary | ICD-10-CM | POA: Diagnosis present

## 2015-11-02 DIAGNOSIS — I8390 Asymptomatic varicose veins of unspecified lower extremity: Secondary | ICD-10-CM | POA: Diagnosis present

## 2015-11-02 DIAGNOSIS — F329 Major depressive disorder, single episode, unspecified: Secondary | ICD-10-CM | POA: Diagnosis present

## 2015-11-02 DIAGNOSIS — R441 Visual hallucinations: Secondary | ICD-10-CM | POA: Diagnosis present

## 2015-11-02 DIAGNOSIS — Z79899 Other long term (current) drug therapy: Secondary | ICD-10-CM | POA: Diagnosis not present

## 2015-11-02 DIAGNOSIS — M5137 Other intervertebral disc degeneration, lumbosacral region: Secondary | ICD-10-CM | POA: Diagnosis present

## 2015-11-02 DIAGNOSIS — G8929 Other chronic pain: Secondary | ICD-10-CM | POA: Diagnosis present

## 2015-11-02 DIAGNOSIS — G629 Polyneuropathy, unspecified: Secondary | ICD-10-CM | POA: Diagnosis present

## 2015-11-02 DIAGNOSIS — F1020 Alcohol dependence, uncomplicated: Secondary | ICD-10-CM | POA: Diagnosis present

## 2015-11-02 DIAGNOSIS — F10231 Alcohol dependence with withdrawal delirium: Secondary | ICD-10-CM | POA: Diagnosis not present

## 2015-11-02 DIAGNOSIS — E039 Hypothyroidism, unspecified: Secondary | ICD-10-CM | POA: Diagnosis present

## 2015-11-02 DIAGNOSIS — M79606 Pain in leg, unspecified: Secondary | ICD-10-CM | POA: Diagnosis present

## 2015-11-02 DIAGNOSIS — F4310 Post-traumatic stress disorder, unspecified: Secondary | ICD-10-CM | POA: Diagnosis present

## 2015-11-02 DIAGNOSIS — F10239 Alcohol dependence with withdrawal, unspecified: Secondary | ICD-10-CM | POA: Diagnosis present

## 2015-11-02 DIAGNOSIS — R52 Pain, unspecified: Secondary | ICD-10-CM

## 2015-11-02 DIAGNOSIS — F1910 Other psychoactive substance abuse, uncomplicated: Secondary | ICD-10-CM | POA: Diagnosis present

## 2015-11-02 DIAGNOSIS — F10931 Alcohol use, unspecified with withdrawal delirium: Secondary | ICD-10-CM

## 2015-11-02 DIAGNOSIS — R51 Headache: Secondary | ICD-10-CM | POA: Diagnosis present

## 2015-11-02 HISTORY — DX: Alcohol use, unspecified with withdrawal, unspecified: F10.939

## 2015-11-02 HISTORY — DX: Hypothyroidism, unspecified: E03.9

## 2015-11-02 HISTORY — DX: Asymptomatic varicose veins of unspecified lower extremity: I83.90

## 2015-11-02 HISTORY — DX: Alcohol dependence with withdrawal, unspecified: F10.239

## 2015-11-02 HISTORY — DX: Deficiency of other specified B group vitamins: E53.8

## 2015-11-02 HISTORY — DX: Other chronic pain: G89.29

## 2015-11-02 LAB — COMPREHENSIVE METABOLIC PANEL
ALT: 36 U/L (ref 17–63)
AST: 61 U/L — ABNORMAL HIGH (ref 15–41)
Albumin: 4.5 g/dL (ref 3.5–5.0)
Alkaline Phosphatase: 53 U/L (ref 38–126)
Anion gap: 8 (ref 5–15)
BUN: 13 mg/dL (ref 6–20)
CO2: 23 mmol/L (ref 22–32)
Calcium: 9.6 mg/dL (ref 8.9–10.3)
Chloride: 100 mmol/L — ABNORMAL LOW (ref 101–111)
Creatinine, Ser: 0.77 mg/dL (ref 0.61–1.24)
GFR calc Af Amer: >60 mL/min (ref >60)
GFR calc non Af Amer: >60 mL/min (ref >60)
Glucose, Bld: 126 mg/dL — ABNORMAL HIGH (ref 65–99)
Potassium: 3.5 mmol/L (ref 3.5–5.1)
Sodium: 131 mmol/L — ABNORMAL LOW (ref 135–145)
Total Bilirubin: 1.3 mg/dL — ABNORMAL HIGH (ref 0.3–1.2)
Total Protein: 8.3 g/dL — ABNORMAL HIGH (ref 6.5–8.1)

## 2015-11-02 LAB — URINE DRUG SCREEN, QUALITATIVE (ARMC ONLY)
Amphetamines, Ur Screen: NOT DETECTED
Barbiturates, Ur Screen: NOT DETECTED
Benzodiazepine, Ur Scrn: POSITIVE — AB
Cannabinoid 50 Ng, Ur ~~LOC~~: NOT DETECTED
Cocaine Metabolite,Ur ~~LOC~~: NOT DETECTED
MDMA (Ecstasy)Ur Screen: NOT DETECTED
Methadone Scn, Ur: NOT DETECTED
Opiate, Ur Screen: NOT DETECTED
Phencyclidine (PCP) Ur S: NOT DETECTED
Tricyclic, Ur Screen: NOT DETECTED

## 2015-11-02 LAB — CBC
HCT: 36.5 % — ABNORMAL LOW (ref 40.0–52.0)
Hemoglobin: 12.3 g/dL — ABNORMAL LOW (ref 13.0–18.0)
MCH: 30.8 pg (ref 26.0–34.0)
MCHC: 33.6 g/dL (ref 32.0–36.0)
MCV: 91.7 fL (ref 80.0–100.0)
Platelets: 184 K/uL (ref 150–440)
RBC: 3.98 MIL/uL — ABNORMAL LOW (ref 4.40–5.90)
RDW: 14.5 % (ref 11.5–14.5)
WBC: 5.5 K/uL (ref 3.8–10.6)

## 2015-11-02 LAB — ETHANOL: Alcohol, Ethyl (B): <5 mg/dL

## 2015-11-02 MED ORDER — LORAZEPAM 1 MG PO TABS
1.0000 mg | ORAL_TABLET | Freq: Once | ORAL | Status: AC
  Administered 2015-11-02: 1 mg via ORAL
  Filled 2015-11-02: qty 2

## 2015-11-02 MED ORDER — LORAZEPAM 2 MG/ML IJ SOLN
1.0000 mg | Freq: Once | INTRAMUSCULAR | Status: AC
  Administered 2015-11-02: 1 mg via INTRAVENOUS
  Filled 2015-11-02: qty 1

## 2015-11-02 MED ORDER — FOLIC ACID 1 MG PO TABS
1.0000 mg | ORAL_TABLET | Freq: Every day | ORAL | Status: DC
  Administered 2015-11-02: 1 mg via ORAL
  Filled 2015-11-02: qty 1

## 2015-11-02 MED ORDER — ACETAMINOPHEN 325 MG PO TABS
650.0000 mg | ORAL_TABLET | Freq: Four times a day (QID) | ORAL | Status: DC | PRN
  Administered 2015-11-03 – 2015-11-04 (×2): 650 mg via ORAL
  Filled 2015-11-02 (×2): qty 2

## 2015-11-02 MED ORDER — ACETAMINOPHEN 500 MG PO TABS
1000.0000 mg | ORAL_TABLET | Freq: Once | ORAL | Status: AC
  Administered 2015-11-02: 1000 mg via ORAL
  Filled 2015-11-02: qty 2

## 2015-11-02 MED ORDER — FLUOXETINE HCL 20 MG PO CAPS
20.0000 mg | ORAL_CAPSULE | Freq: Every day | ORAL | Status: DC
  Administered 2015-11-02 – 2015-11-05 (×4): 20 mg via ORAL
  Filled 2015-11-02 (×4): qty 1

## 2015-11-02 MED ORDER — THIAMINE HCL 100 MG/ML IJ SOLN
100.0000 mg | Freq: Every day | INTRAMUSCULAR | Status: DC
  Administered 2015-11-02: 100 mg via INTRAVENOUS
  Filled 2015-11-02: qty 2

## 2015-11-02 MED ORDER — LORAZEPAM 1 MG PO TABS
1.0000 mg | ORAL_TABLET | Freq: Four times a day (QID) | ORAL | Status: DC | PRN
  Administered 2015-11-04: 1 mg via ORAL
  Filled 2015-11-02: qty 1

## 2015-11-02 MED ORDER — IOPAMIDOL (ISOVUE-300) INJECTION 61%
100.0000 mL | Freq: Once | INTRAVENOUS | Status: AC | PRN
  Administered 2015-11-02: 100 mL via INTRAVENOUS

## 2015-11-02 MED ORDER — ONDANSETRON HCL 4 MG PO TABS: 4.0000 mg | ORAL_TABLET | Freq: Four times a day (QID) | ORAL | Status: DC | PRN

## 2015-11-02 MED ORDER — DIATRIZOATE MEGLUMINE & SODIUM 66-10 % PO SOLN
15.0000 mL | ORAL | Status: AC
Start: 2015-11-02 — End: 2015-11-02
  Filled 2015-11-02 (×2): qty 30

## 2015-11-02 MED ORDER — NALTREXONE HCL 50 MG PO TABS
50.0000 mg | ORAL_TABLET | Freq: Every day | ORAL | Status: DC
  Administered 2015-11-02 – 2015-11-05 (×4): 50 mg via ORAL
  Filled 2015-11-02 (×4): qty 1

## 2015-11-02 MED ORDER — LORAZEPAM 1 MG PO TABS
1.0000 mg | ORAL_TABLET | Freq: Once | ORAL | Status: AC
  Administered 2015-11-02: 1 mg via ORAL
  Filled 2015-11-02: qty 1

## 2015-11-02 MED ORDER — HALOPERIDOL LACTATE 5 MG/ML IJ SOLN
2.5000 mg | Freq: Four times a day (QID) | INTRAMUSCULAR | Status: DC | PRN
  Administered 2015-11-04: 02:00:00 2.5 mg via INTRAVENOUS
  Filled 2015-11-02 (×2): qty 1

## 2015-11-02 MED ORDER — LORAZEPAM 2 MG/ML IJ SOLN
1.0000 mg | Freq: Four times a day (QID) | INTRAMUSCULAR | Status: DC | PRN
  Administered 2015-11-02: 21:00:00 1 mg via INTRAVENOUS
  Filled 2015-11-02: qty 1

## 2015-11-02 MED ORDER — LORAZEPAM 2 MG/ML IJ SOLN
2.0000 mg | Freq: Once | INTRAMUSCULAR | Status: AC
  Administered 2015-11-02: 22:00:00 2 mg via INTRAVENOUS
  Filled 2015-11-02: qty 1

## 2015-11-02 MED ORDER — ACETAMINOPHEN 650 MG RE SUPP: 650.0000 mg | Freq: Four times a day (QID) | RECTAL | Status: DC | PRN

## 2015-11-02 MED ORDER — GADOBENATE DIMEGLUMINE 529 MG/ML IV SOLN: 20.0000 mL | Freq: Once | INTRAVENOUS | Status: DC | PRN

## 2015-11-02 MED ORDER — GABAPENTIN 400 MG PO CAPS
400.0000 mg | ORAL_CAPSULE | Freq: Three times a day (TID) | ORAL | Status: DC
  Administered 2015-11-02 – 2015-11-05 (×8): 400 mg via ORAL
  Filled 2015-11-02 (×8): qty 1

## 2015-11-02 MED ORDER — ONDANSETRON HCL 4 MG/2ML IJ SOLN
4.0000 mg | Freq: Four times a day (QID) | INTRAMUSCULAR | Status: DC | PRN
Start: 2015-11-02 — End: 2015-11-05

## 2015-11-02 MED ORDER — LEVOTHYROXINE SODIUM 100 MCG PO TABS
100.0000 ug | ORAL_TABLET | Freq: Every day | ORAL | Status: DC
  Administered 2015-11-03 – 2015-11-05 (×3): 100 ug via ORAL
  Filled 2015-11-02 (×3): qty 1

## 2015-11-02 MED ORDER — VITAMIN B-1 100 MG PO TABS
100.0000 mg | ORAL_TABLET | Freq: Every day | ORAL | Status: DC
  Administered 2015-11-03 – 2015-11-05 (×3): 100 mg via ORAL
  Filled 2015-11-02 (×4): qty 1

## 2015-11-02 MED ORDER — ENOXAPARIN SODIUM 40 MG/0.4ML ~~LOC~~ SOLN
40.0000 mg | SUBCUTANEOUS | Status: DC
  Administered 2015-11-02 – 2015-11-04 (×3): 40 mg via SUBCUTANEOUS
  Filled 2015-11-02 (×3): qty 0.4

## 2015-11-02 MED ORDER — FOLIC ACID 1 MG PO TABS
1.0000 mg | ORAL_TABLET | Freq: Every day | ORAL | Status: DC
  Administered 2015-11-03 – 2015-11-05 (×3): 1 mg via ORAL
  Filled 2015-11-02 (×3): qty 1

## 2015-11-02 MED ORDER — ADULT MULTIVITAMIN W/MINERALS CH
1.0000 | ORAL_TABLET | Freq: Every day | ORAL | Status: DC
  Administered 2015-11-02 – 2015-11-05 (×4): 1 via ORAL
  Filled 2015-11-02 (×4): qty 1

## 2015-11-02 MED ORDER — PANTOPRAZOLE SODIUM 40 MG PO TBEC
40.0000 mg | DELAYED_RELEASE_TABLET | Freq: Every day | ORAL | Status: DC
  Administered 2015-11-02 – 2015-11-05 (×4): 40 mg via ORAL
  Filled 2015-11-02 (×4): qty 1
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
## 2015-11-02 NOTE — ED Provider Notes (Signed)
[facility] Emergency Department Provider Note  ____________________________________________  Time seen: Approximately 4:02 PM  I have reviewed the triage vital signs and the nursing notes.   HISTORY  Chief Complaint Hallucinations    HPI Ivan Hamilton is a 50 y.o. male who has been drinking alcohol. He cannot tell me how much. He is however trying to stop and his outpatient doctors been giving some Librium to him to help with this. He is however still very shaky in spite of not having had a drink since Sunday and he is now hallucinating and seeing people who aren't there especially at night. He also complains of left lower quadrant pain that has been going on for many days and also pain radiating from his low back into the left buttocks and down the left leg to the foot this has been going on for months and at times gets bad in his leg does not work anymore he says. He denies any incontinence but says that his left leg is weak now. Both the leg pain and abdominal pain or moderate nature and achy   History reviewed. No pertinent past medical history.  Patient Active Problem List   Diagnosis Date Noted  . Alcohol withdrawal ([place]) 09/02/2015  . Alcohol use disorder, severe, dependence ([place]) 09/02/2015  . 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[place]) 09/02/2015  . PTSD (post-traumatic stress disorder) 09/01/2015  . Hypothyroid 09/01/2015  . Gastric reflux 09/01/2015  . DDD (degenerative disc disease), lumbosacral 09/01/2015    Past Surgical History  Procedure Laterality Date  . Leg surgery      Current Outpatient Rx  Name  Route  Sig  Dispense  Refill  . FLUoxetine (PROZAC) 20 MG capsule   Oral   Take 1 capsule (20 mg total) by mouth daily.   30 capsule   0   . gabapentin (NEURONTIN) 400 MG capsule   Oral   Take 1 capsule (400 mg total) by mouth 3 (three) times daily.   90 capsule    0   . levothyroxine (SYNTHROID, LEVOTHROID) 100 MCG tablet   Oral   Take 100 mcg by mouth daily before breakfast.         . naltrexone (DEPADE) 50 MG tablet   Oral   Take 1 tablet (50 mg total) by mouth daily.   30 tablet   0   . pantoprazole (PROTONIX) 40 MG tablet   Oral   Take 1 tablet (40 mg total) by mouth daily.   30 tablet   0     Allergies Review of patient's allergies indicates no known allergies.  No family history on file.  Social History Social History  Substance Use Topics  . Smoking status: Never Smoker   . Smokeless tobacco: None  . Alcohol Use: Yes     Comment: daily- 1 pint of vodka    Review of Systems Constitutional: No fever/chills Eyes: No visual changes. ENT: No sore throat. Cardiovascular: Denies chest pain. Respiratory: Denies shortness of breath. Gastrointestinal abdominal pain.  No nausea, no vomiting.   diarrhea.  No constipation. Genitourinary: Negative for dysuria. Musculoskeletal:  back pain. Skin: Negative for rash. Neurological: Negative for headaches, focal weakness or numbness.  10-point ROS otherwise negative.  ____________________________________________   PHYSICAL EXAM:  VITAL SIGNS: ED Triage Vitals  Enc Vitals Group     BP 11/02/15 1414 135/86 mmHg     Pulse Rate 11/02/15 1414 84  Resp 11/02/15 1414 18     Temp 11/02/15 1414 98.2 F (36.8 C)     Temp Source 11/02/15 1414 Oral     SpO2 11/02/15 1414 96 %     Weight 11/02/15 1428 134 lb (60.782 kg)     Height 11/02/15 1428 5\' 3"  (1.6 m)     Head Cir --      Peak Flow --      Pain Score 11/02/15 1428 7     Pain Loc --      Pain Edu? --      Excl. in [place]? --     Constitutional: Alert and oriented. Well appearing But having tremors. Eyes: Conjunctivae are normal. PERRL. EOMI. Head: Atraumatic. Nose: No congestion/rhinnorhea. Mouth/Throat: Mucous membranes are moist.  Oropharynx non-erythematous. Neck: No stridor.   Cardiovascular: Normal rate, regular  rhythm. Grossly normal heart sounds.  Good peripheral circulation. Respiratory: Normal respiratory effort.  No retractions. Lungs CTAB. Gastrointestinal: Soft tender to palpation and slightly to percussion in the left lower quadrant. No distention. No abdominal bruits. No CVA tenderness. Musculoskeletal: No lower extremity tenderness nor edema.  No joint effusions. Neurologic:  Normal speech and language. Left leg feels diffusely slightly weaker than the right. DTRs are 1+ in the right knee and right ankle absent in the left ankle and slightly decreased compared to the right side in the knee. Skin:  Skin is warm, dry and intact. No rash noted. Psychiatric: Mood and affect are normal. Speech and behavior are normal.  ____________________________________________   LABS (all labs ordered are listed, but only abnormal results are displayed)  Labs Reviewed  COMPREHENSIVE METABOLIC PANEL - Abnormal; Notable for the following:    Sodium 131 (*)    Chloride 100 (*)    Glucose, Bld 126 (*)    Total Protein 8.3 (*)    AST 61 (*)    Total Bilirubin 1.3 (*)    All other components within normal limits  CBC - Abnormal; Notable for the following:    RBC 3.98 (*)    Hemoglobin 12.3 (*)    HCT 36.5 (*)    All other components within normal limits  URINE DRUG SCREEN, QUALITATIVE ([facility] ONLY) - Abnormal; Notable for the following:    Benzodiazepine, Ur Scrn POSITIVE (*)    All other components within normal limits  ETHANOL   ____________________________________________  EKG   ____________________________________________  RADIOLOGY  CT of the abdomen read by radiology showing no acute pathology. There is mild distention of the collecting systems of the kidney bilaterally due to distention of bladder. MRI of the back shows mild foraminal impingement per radiology. There does not appear to be any severe  disease. ____________________________________________   PROCEDURES    ____________________________________________   INITIAL IMPRESSION / ASSESSMENT AND PLAN / ED COURSE  Pertinent labs & imaging results that were available during my care of the patient were reviewed by me and considered in my medical decision making (see chart for details).   ____________________________________________   FINAL CLINICAL IMPRESSION(S) / ED DIAGNOSES  Final diagnoses:  Pain  Alcohol withdrawal delirium (HCC)      Nena Polio, MD 11/02/15 1801

## 2015-11-02 NOTE — BH Assessment (Signed)
Assessment Note  Ivan Hamilton is an 50 y.o. male who presents to the ER due to his family having concerns about his withdrawal Symptoms. Patient is starting to have visual hallucinations. On today, he was seeing "some woman standing at my bed. I know she wasn't really there." Patient was admitted to the medical floor, in the past due to similar complaint.   During the interview, the patient was calm, cooperative and polite. He was requesting to go home. However, due to his wife, he decided to stay and be admitted to medical floor.   Patient denies SI/HI. Endorses AV/H due to alcohol withdrawal.  Diagnosis: Alcohol Use Disorder, Severe  Past Medical History:  Past Medical History  Diagnosis Date  . Alcohol withdrawal ([place])   . Hypothyroidism   . Chronic pain   . Varicose veins   . B12 deficiency     Past Surgical History  Procedure Laterality Date  . Leg surgery      Family History: No family history on file.  Social History:  reports that he has never smoked. He does not have any smokeless tobacco history on file. He reports that he drinks alcohol. He reports that he does not use illicit drugs.  Additional Social History:  Alcohol / Drug Use Pain Medications: See PTA Prescriptions: See PTA Over the Counter: See PTA History of alcohol / drug use?: Yes Longest period of sobriety (when/how long): Unknown Negative Consequences of Use: Financial, Personal relationships, Work / School Withdrawal Symptoms: Delirium, Weakness, Tremors, DTs, Sweats Substance #1 Name of Substance 1: Alcohol  CIWA: CIWA-Ar BP: 127/82 mmHg Pulse Rate: 71 Nausea and Vomiting: no nausea and no vomiting Tactile Disturbances: none Tremor: three Auditory Disturbances: not present Paroxysmal Sweats: no sweat visible Visual Disturbances: moderate sensitivity Anxiety: moderately anxious, or guarded, so anxiety is inferred Headache, Fullness in Head: none present Agitation: normal  activity Orientation and Clouding of Sensorium: oriented and can do serial additions CIWA-Ar Total: 10 COWS:    Allergies: No Known Allergies  Home Medications:  Medications Prior to Admission  Medication Sig Dispense Refill  . FLUoxetine (PROZAC) 20 MG capsule Take 1 capsule (20 mg total) by mouth daily. 30 capsule 0  . folic acid (FOLVITE) 1 MG tablet Take 1 mg by mouth daily.    Marland Kitchen gabapentin (NEURONTIN) 400 MG capsule Take 1 capsule (400 mg total) by mouth 3 (three) times daily. 90 capsule 0  . levothyroxine (SYNTHROID, LEVOTHROID) 100 MCG tablet Take 100 mcg by mouth daily before breakfast.    . LORazepam (ATIVAN) 1 MG tablet Take 1 mg by mouth 2 (two) times daily as needed for anxiety.    . naltrexone (DEPADE) 50 MG tablet Take 1 tablet (50 mg total) by mouth daily. 30 tablet 0  . pantoprazole (PROTONIX) 40 MG tablet Take 1 tablet (40 mg total) by mouth daily. 30 tablet 0  . thiamine (VITAMIN B-1) 100 MG tablet Take 100 mg by mouth daily.    . vitamin B-12 (CYANOCOBALAMIN) 100 MCG tablet Take 100 mcg by mouth daily.      OB/GYN Status:  No LMP for male patient.  General Assessment Data Location of Assessment: [facility] ED TTS Assessment: In system Is this a Tele or Face-to-Face Assessment?: Face-to-Face Is this an Initial Assessment or a Re-assessment for this encounter?: Initial Assessment Marital status: Married [place] name: n/a Is patient pregnant?: No Pregnancy Status: No Living Arrangements: Spouse/significant other Can pt return to current living arrangement?: Yes Admission Status: Voluntary Is patient  capable of signing voluntary admission?: Yes Referral Source: Self/Family/Friend Insurance type: Medicaid  Medical Screening Exam ([place]) Medical Exam completed: Yes  Crisis Care Plan Living Arrangements: Spouse/significant other Legal Guardian: Other: (None) Name of Psychiatrist: Reports of none Name of Therapist: Reports of none  Education Status Is  patient currently in school?: No Current Grade: n/a Highest grade of school patient has completed: Unknown Name of school: n/a Contact person: n/a  Risk to self with the past 6 months Suicidal Ideation: No Has patient been a risk to self within the past 6 months prior to admission? : No Suicidal Intent: No Has patient had any suicidal intent within the past 6 months prior to admission? : No Is patient at risk for suicide?: No Suicidal Plan?: No Has patient had any suicidal plan within the past 6 months prior to admission? : No Access to Means: No What has been your use of drugs/alcohol within the last 12 months?: Alcohol Previous Attempts/Gestures: No How many times?: 0 Other Self Harm Risks: Reports of none Triggers for Past Attempts: None known Intentional Self Injurious Behavior: None Family Suicide History: No Recent stressful life event(s): Other (Comment) (None) Persecutory voices/beliefs?: No Depression: No Depression Symptoms:  (Reports of none) Substance abuse history and/or treatment for substance abuse?: Yes Suicide prevention information given to non-admitted patients: Not applicable  Risk to Others within the past 6 months Homicidal Ideation: No Does patient have any lifetime risk of violence toward others beyond the six months prior to admission? : No Thoughts of Harm to Others: No Current Homicidal Intent: No Current Homicidal Plan: No Access to Homicidal Means: No Identified Victim: Reports of none History of harm to others?: No Assessment of Violence: None Noted Violent Behavior Description: Reports of none Does patient have access to weapons?: No Criminal Charges Pending?: No Does patient have a court date: No Is patient on probation?: No  Psychosis Hallucinations: Auditory, Visual (Due to Alcohol Withdrawal) Delusions: None noted  Mental Status Report Appearance/Hygiene: Unremarkable, In scrubs, In hospital gown Eye Contact: Poor Motor Activity:  Freedom of movement, Unremarkable Speech: Logical/coherent, Unremarkable Level of Consciousness: Alert Mood: Anxious, Sad, Pleasant Affect: Appropriate to circumstance, Sad Anxiety Level: Minimal Thought Processes: Coherent, Relevant Judgement: Unimpaired Orientation: Person, Place, Time, Situation, Appropriate for developmental age Obsessive Compulsive Thoughts/Behaviors: Minimal  Cognitive Functioning Concentration: Normal Memory: Recent Intact, Remote Intact IQ: Average Insight: Fair Impulse Control: Poor Appetite: Fair Weight Loss: 0 Weight Gain: 0 Sleep: No Change Total Hours of Sleep: 8 Vegetative Symptoms: None  ADLScreening [facility] Assessment Services) Patient's cognitive ability adequate to safely complete daily activities?: Yes Patient able to express need for assistance with ADLs?: Yes Independently performs ADLs?: Yes (appropriate for developmental age)  Prior Inpatient Therapy Prior Inpatient Therapy: No Prior Therapy Dates: n/a Prior Therapy Facilty/Provider(s): n/a Reason for Treatment: n/a  Prior Outpatient Therapy Prior Outpatient Therapy: No Prior Therapy Dates: n/a Prior Therapy Facilty/Provider(s): n/a Reason for Treatment: n/a Does patient have an ACCT team?: No Does patient have Intensive In-House Services?  : No Does patient have [facility] services? : No Does patient have P4CC services?: No  ADL Screening (condition at time of admission) Patient's cognitive ability adequate to safely complete daily activities?: Yes Is the patient deaf or have difficulty hearing?: No Does the patient have difficulty seeing, even when wearing glasses/contacts?: No Does the patient have difficulty concentrating, remembering, or making decisions?: No Patient able to express need for assistance with ADLs?: Yes Does the patient have difficulty dressing  or bathing?: No Independently performs ADLs?: Yes (appropriate for developmental age) Does the patient have difficulty  walking or climbing stairs?: No Weakness of Legs: None Weakness of Arms/Hands: None  Home Assistive Devices/Equipment Home Assistive Devices/Equipment: None  Therapy Consults (therapy consults require a physician order) PT Evaluation Needed: No OT Evalulation Needed: No SLP Evaluation Needed: No Abuse/Neglect Assessment (Assessment to be complete while patient is alone) Physical Abuse: Denies Verbal Abuse: Denies Sexual Abuse: Denies Exploitation of patient/patient's resources: Denies Self-Neglect: Denies Values / Beliefs Cultural Requests During Hospitalization: None Spiritual Requests During Hospitalization: None Consults Spiritual Care Consult Needed: No Social Work Consult Needed: No Regulatory affairs officer (For Healthcare) Does patient have an advance directive?: No Would patient like information on creating an advanced directive?: No - patient declined information Nutrition Screen- [facility] Adult/WL/AP Patient's home diet: Regular Has the patient recently lost weight without trying?: No Has the patient been eating poorly because of a decreased appetite?: Yes Malnutrition Screening Tool Score: 1  Additional Information 1:1 In Past 12 Months?: No CIRT Risk: No Elopement Risk: No Does patient have medical clearance?: No (Being admitted to medical floor)  Child/Adolescent Assessment Running Away Risk: Denies (Patient is an adult)  Disposition:  Disposition Initial Assessment Completed for this Encounter: Yes Disposition of Patient: Other dispositions (Being admitted to medical floor) Other disposition(s): Other (Comment) (Being admitted to medical floor)  On Site Evaluation by:   Reviewed with Physician:    Gunnar Fusi MS, LCAS, LPC, [place], CCSI Therapeutic Triage Specialist 11/02/2015 8:12 PM

## 2015-11-02 NOTE — ED Notes (Signed)
Patient transported to radiology

## 2015-11-02 NOTE — Consult Note (Signed)
  Psychiatry: Patient seen. Case reviewed with emergency room physician and hospitalist and TTS. 50 year old man well known to me from previous hospitalizations with a history of alcohol dependence. Patient reports his last alcoholic drink was this Sunday. His doctor had been trying to detox him outpatient but today the patient has developed visual hallucinations and confusion. Patient denies any mood symptoms. No suicidal or homicidal ideation. Remains insightful and understands that these things are not real but says that he is seeing people dance and run around his bed. Patient's vital signs are currently stable but he looks a little shaky in his affect is a bit confused.  Patient does have a history of posttraumatic stress disorder but is not currently requiring psychiatric hospitalization. Based on his past history of delirium tremens he is likely to go through several days of DTs and sickness which can be controlled in the hospital. Patient was counseled that it would be the wiser decision to allow himself to be admitted to the hospital. He agreed when he understood that we were talking about the medical service and not the psychiatric service.  I will follow-up. This patient very much needs to seriously get involved in treatment for his alcohol abuse and stay sober long term. I will continue to provide information and support for the patient and his family prior to discharge.

## 2015-11-02 NOTE — H&P (Signed)
[facility] NAME: Ivan Hamilton    MR#:  [id number]  DATE OF BIRTH:  10-20-1965  DATE OF ADMISSION:  11/02/2015  PRIMARY CARE PHYSICIAN: Tracie Harrier, MD   REQUESTING/REFERRING PHYSICIAN: Dr. Conni Slipper  CHIEF COMPLAINT:   Chief Complaint  Patient presents with  . Hallucinations    HISTORY OF PRESENT ILLNESS:  Ivan Hamilton  is a 50 y.o. male with a known history of Alcohol abuse with withdrawal, hypothyroidism, chronic pain, varicose veins, B12 deficiency who presents to the hospital due to hallucinations and confusion. As per the patient's wife gives most of the history patient has a history of heavy alcohol abuse and has been drinking until 2-3 days ago. Patient this morning was hallucinating and confused and therefore she brought him to the ER for further evaluation. Patient was also complaining of vague abdominal and lower back pain and difficulty using his legs and underwent a CT scan of his abdomen and pelvis which was essentially benign along with MRI of the lumbar spine which shows some mild disc protrusion at L4-L5 and L5-S1 with significant central canal compressive stenosis. Given his ongoing hallucinations and alcohol withdrawal hospitalist services were contacted further treatment and evaluation.  PAST MEDICAL HISTORY:   Past Medical History  Diagnosis Date  . Alcohol withdrawal ([place])   . Hypothyroidism   . Chronic pain   . Varicose veins   . B12 deficiency     PAST SURGICAL HISTORY:   Past Surgical History  Procedure Laterality Date  . Leg surgery      SOCIAL HISTORY:   Social History  Substance Use Topics  . Smoking status: Never Smoker   . Smokeless tobacco: Not on file  . Alcohol Use: Yes     Comment: daily- 1 pint of vodka    FAMILY HISTORY:  No family history on file.  DRUG ALLERGIES:  No Known Allergies  REVIEW OF SYSTEMS:   Review of Systems  Constitutional: Negative  for fever and weight loss.  HENT: Negative for congestion, nosebleeds and tinnitus.   Eyes: Negative for blurred vision, double vision and redness.  Respiratory: Negative for cough, hemoptysis and shortness of breath.   Cardiovascular: Negative for chest pain, orthopnea, leg swelling and PND.  Gastrointestinal: Negative for nausea, vomiting, abdominal pain, diarrhea and melena.  Genitourinary: Negative for dysuria, urgency and hematuria.  Musculoskeletal: Positive for back pain. Negative for joint pain and falls.  Neurological: Negative for dizziness, tingling, sensory change, focal weakness, seizures, weakness and headaches.  Endo/Heme/Allergies: Negative for polydipsia. Does not bruise/bleed easily.  Psychiatric/Behavioral: Positive for hallucinations. Negative for depression and memory loss. The patient is nervous/anxious.     MEDICATIONS AT HOME:   Prior to Admission medications   Medication Sig Start Date End Date Taking? Authorizing Provider  FLUoxetine (PROZAC) 20 MG capsule Take 1 capsule (20 mg total) by mouth daily. 09/05/15  Yes Hildred Priest, MD  folic acid (FOLVITE) 1 MG tablet Take 1 mg by mouth daily.   Yes Historical Provider, MD  gabapentin (NEURONTIN) 400 MG capsule Take 1 capsule (400 mg total) by mouth 3 (three) times daily. 09/05/15  Yes Hildred Priest, MD  levothyroxine (SYNTHROID, LEVOTHROID) 100 MCG tablet Take 100 mcg by mouth daily before breakfast.   Yes Historical Provider, MD  LORazepam (ATIVAN) 1 MG tablet Take 1 mg by mouth 2 (two) times daily as needed for anxiety.   Yes Historical Provider, MD  naltrexone (DEPADE) 50 MG tablet  Take 1 tablet (50 mg total) by mouth daily. 09/05/15  Yes Hildred Priest, MD  pantoprazole (PROTONIX) 40 MG tablet Take 1 tablet (40 mg total) by mouth daily. 09/05/15  Yes Hildred Priest, MD  thiamine (VITAMIN B-1) 100 MG tablet Take 100 mg by mouth daily.   Yes Historical Provider, MD  vitamin  B-12 (CYANOCOBALAMIN) 100 MCG tablet Take 100 mcg by mouth daily.   Yes Historical Provider, MD      VITAL SIGNS:  Blood pressure 130/88, pulse 82, temperature 98.2 F (36.8 C), temperature source Oral, resp. rate 18, height 5\' 3"  (1.6 m), weight 60.782 kg (134 lb), SpO2 98 %.  PHYSICAL EXAMINATION:  Physical Exam  GENERAL:  50 y.o.-year-old patient lying in the bed Anxious and jittery.  EYES: Pupils equal, round, reactive to light and accommodation. No scleral icterus. Extraocular muscles intact.  HEENT: Head atraumatic, normocephalic. Oropharynx and nasopharynx clear. No oropharyngeal erythema, moist oral mucosa  NECK:  Supple, no jugular venous distention. No thyroid enlargement, no tenderness.  LUNGS: Normal breath sounds bilaterally, no wheezing, rales, rhonchi. No use of accessory muscles of respiration.  CARDIOVASCULAR: S1, S2 RRR. No murmurs, rubs, gallops, clicks.  ABDOMEN: Soft, nontender, nondistended. Bowel sounds present. No organomegaly or mass.  EXTREMITIES: No pedal edema, cyanosis, or clubbing. + 2 pedal & radial pulses b/l.   NEUROLOGIC: Cranial nerves II through XII are intact. No focal Motor or sensory deficits appreciated b/l.   PSYCHIATRIC: The patient is alert and oriented x 3. Anxious SKIN: No obvious rash, lesion, or ulcer.   LABORATORY PANEL:   CBC  Recent Labs Lab 11/02/15 1427  WBC 5.5  HGB 12.3*  HCT 36.5*  PLT 184   ------------------------------------------------------------------------------------------------------------------  Chemistries   Recent Labs Lab 11/02/15 1427  NA 131*  K 3.5  CL 100*  CO2 23  GLUCOSE 126*  BUN 13  CREATININE 0.77  CALCIUM 9.6  AST 61*  ALT 36  ALKPHOS 53  BILITOT 1.3*   ------------------------------------------------------------------------------------------------------------------  Cardiac Enzymes No results for input(s): TROPONINI in the last 168  hours. ------------------------------------------------------------------------------------------------------------------  RADIOLOGY:  Mr Lumbar Spine Wo Contrast  11/02/2015  CLINICAL DATA:  Low back pain extending into the lower extremities, predominantly on the left. Pain since December 2016 with significant progression over the last week. EXAM: MRI LUMBAR SPINE WITHOUT CONTRAST TECHNIQUE: Multiplanar, multisequence MR imaging of the lumbar spine was performed. No intravenous contrast was administered. COMPARISON:  CT of the abdomen and pelvis 11/02/2015. FINDINGS: Normal signal is present in main conus medullaris which terminates at T12-L1, within normal limits. Marrow signal, vertebral body heights, and alignment are normal. Limited imaging the abdomen is unremarkable. The disc levels at L2-3 and above are normal. L3-4: There is mild desiccation of the disc without significant protrusion or stenosis. L4-5: A mild broad-based disc protrusion is present. There is slight distortion of the central canal. Mild prominence of epidural fat is noted. L5-S1: Prominent epidural fat is present. A right paramedian disc protrusion and annular tear are noted. The mild right foraminal narrowing is evident. IMPRESSION: 1. Mild broad-based disc protrusion at L4-5 and L5-S1 without significant central canal compressive stenosis. 2. Mild right foraminal narrowing at L5-S1. 3. 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 Electronically Signed   By: San Morelle M.D.   On: 11/02/2015 17:51   Ct Abdomen Pelvis W Contrast  11/02/2015  CLINICAL DATA:  Left lower abdominal pain. EXAM: CT ABDOMEN AND PELVIS WITH CONTRAST TECHNIQUE: Multidetector CT imaging of the abdomen and  pelvis was performed using the standard protocol following bolus administration of intravenous contrast. CONTRAST:  196mL ISOVUE-300 IOPAMIDOL (ISOVUE-300) INJECTION 61% COMPARISON:  None. FINDINGS: Lower chest: There is mild atelectatic change in the  posterior right lung base region. Visualized lung base regions otherwise appear clear. There are foci of atherosclerotic calcification in the aorta. There is a hiatal hernia with fluid in the distal esophagus. Hepatobiliary: Liver measures 18.1 cm in length. The liver attenuation is significantly decreased diffusely, likely due to hepatic steatosis. Gallbladder is contracted with wall thickness upper normal. No pericholecystic fluid is evident. There is no biliary duct dilatation. Pancreas: No pancreatic mass or inflammatory focus. No peripancreatic fluid. No pancreatic duct dilatation. Spleen: No splenic lesions are evident. Adrenals/Urinary Tract: Adrenals appear normal bilaterally. There is a 7 x 5 mm cyst in the anterior mid left kidney. There is slight symmetric fullness of both renal collecting systems, likely due to diffuse urinary bladder distention. There is no renal or ureteral calculus on either side. Urinary bladder is distended with wall thickness within normal limits. Stomach/Bowel: Stomach is distended with fluid and air. There are multiple proximal sigmoid diverticula without appreciable wall thickening or mesenteric thickening to indicate diverticulitis. There is no appreciable bowel wall or mesenteric thickening. Rectum is mildly distended with air. No bowel obstruction. No free air or portal venous air. Vascular/Lymphatic: There is no abdominal aortic aneurysm. The major mesenteric vessels appear patent. There is no adenopathy in the abdomen or pelvis. Reproductive: Prostate and seminal vesicles appear normal. There is no pelvic mass or pelvic fluid collection. Other: Appendix appears normal. No abscess or ascites is evident in the abdomen or pelvis. There is fat in each inguinal ring. Musculoskeletal: There are no blastic or lytic bone lesions. No intramuscular or abdominal wall lesion evident. IMPRESSION: Small hiatal hernia with fluid in the distal esophagus, likely due to reflux. Diffuse  decreased liver attenuation. This finding is probably due to hepatic steatosis. The dramatic decrease in liver attenuation could also indicate liver hemochromatosis. Both hepatic steatosis and hemochromatosis may be present concurrently. No pancreatic lesions. Distended urinary bladder. Slight fullness of each renal collecting system is likely due to the distention of the urinary bladder. No obstructing lesions seen in either ureter or renal collecting system. In particular, no renal or ureteral calculi on either side. Multiple sigmoid diverticula in the proximal sigmoid region. No frank diverticulitis. No bowel obstruction.  No abscess. Electronically Signed   By: Lowella Grip III M.D.   On: 11/02/2015 16:55     IMPRESSION AND PLAN:   50 year old male with past medical history of alcohol abuse with withdrawal, hypothyroidism, depression, chronic pain who presents to the hospital due to hallucinations and noted to be in alcohol withdrawal.  1. Alcohol abuse with withdrawal-this is the cause of patient's hallucinations and jitteriness. -Patient's last alcoholic drink was [id number] hours ago. -I will place him on CIWA protocol. Also when necessary Haldol. -Continue thiamine and folate. Continue naltrexone -Psychiatric consult for substance abuse.  2. Hypothyroidism-continue Synthroid.  3. neuropathy-continue gabapentin.  4. GERD-continue Protonix.  5. Depression-continue fluoxetine.   All the records are reviewed and case discussed with ED provider. Management plans discussed with the patient, family and they are in agreement.  CODE STATUS: Full  TOTAL TIME TAKING CARE OF THIS PATIENT: 45 minutes.    Henreitta Leber M.D on 11/02/2015 at 6:31 PM  Between 7am to 6pm - Pager - [phone]  After 6pm go to [email or web address] - password [email or web address]  [facility]  Office  [phone]  CC: Primary care physician; Tracie Harrier, MD

## 2015-11-02 NOTE — ED Notes (Signed)
Using interpreter: Pt reports that he is here bc he quit drinking 1 week ago and his doctor placed him on librium and lorazepam to help with withdrawls. Pt reports he then had 3 shots of vodka Sunday, his daughter is concerned bc pt has been having visual hallucinations since yesterday. Pt denies any SI/HI. Pt last took lorazepam 1mg  around 1200 today.

## 2015-11-02 NOTE — ED Notes (Signed)
Ivan Hamilton (wife)- [phone] Minus Breeding (daughter)- [phone]

## 2015-11-03 DIAGNOSIS — F10931 Alcohol use, unspecified with withdrawal delirium: Secondary | ICD-10-CM | POA: Insufficient documentation

## 2015-11-03 DIAGNOSIS — F10231 Alcohol dependence with withdrawal delirium: Secondary | ICD-10-CM | POA: Insufficient documentation

## 2015-11-03 LAB — CBC
HCT: 36.5 % — ABNORMAL LOW (ref 40.0–52.0)
Hemoglobin: 12.4 g/dL — ABNORMAL LOW (ref 13.0–18.0)
MCH: 31.9 pg (ref 26.0–34.0)
MCHC: 34.1 g/dL (ref 32.0–36.0)
MCV: 93.7 fL (ref 80.0–100.0)
Platelets: 171 10*3/uL (ref 150–440)
RBC: 3.89 MIL/uL — ABNORMAL LOW (ref 4.40–5.90)
RDW: 15 % — ABNORMAL HIGH (ref 11.5–14.5)
WBC: 3.8 10*3/uL (ref 3.8–10.6)

## 2015-11-03 LAB — BASIC METABOLIC PANEL
Anion gap: 5 (ref 5–15)
BUN: 11 mg/dL (ref 6–20)
CO2: 26 mmol/L (ref 22–32)
Calcium: 9.5 mg/dL (ref 8.9–10.3)
Chloride: 103 mmol/L (ref 101–111)
Creatinine, Ser: 0.77 mg/dL (ref 0.61–1.24)
GFR calc Af Amer: >60 mL/min (ref >60)
GFR calc non Af Amer: >60 mL/min (ref >60)
Glucose, Bld: 100 mg/dL — ABNORMAL HIGH (ref 65–99)
Potassium: 3.8 mmol/L (ref 3.5–5.1)
Sodium: 134 mmol/L — ABNORMAL LOW (ref 135–145)

## 2015-11-03 MED ORDER — VITAMIN B-12 100 MCG PO TABS
100.0000 ug | ORAL_TABLET | Freq: Every day | ORAL | Status: DC
  Administered 2015-11-03 – 2015-11-05 (×3): 100 ug via ORAL
  Filled 2015-11-03 (×4): qty 1

## 2015-11-03 MED ORDER — CHLORDIAZEPOXIDE HCL 25 MG PO CAPS
50.0000 mg | ORAL_CAPSULE | Freq: Every day | ORAL | Status: AC
  Administered 2015-11-03: 50 mg via ORAL
  Filled 2015-11-03: qty 2

## 2015-11-03 MED ORDER — OXYCODONE HCL 5 MG PO TABS: 5.0000 mg | ORAL_TABLET | ORAL | Status: DC | PRN

## 2015-11-03 MED ORDER — LORAZEPAM 2 MG/ML IJ SOLN
1.0000 mg | INTRAMUSCULAR | Status: DC | PRN
  Administered 2015-11-03 (×5): 1 mg via INTRAVENOUS
  Filled 2015-11-03 (×5): qty 1

## 2015-11-03 NOTE — Consult Note (Signed)
[facility] Face-to-Face Psychiatry Consult   Reason for Consult:  Consult follow-up for this 50 year old man with a history of alcohol abuse and delirium tremens as well as posttraumatic stress disorder Referring Physician:  Hower Patient Identification: Ivan Hamilton MRN:  [id number] Principal Diagnosis: Delirium tremens Diagnosis:   Patient Active Problem List   Diagnosis Date Noted  . Alcohol withdrawal ([place]) [F10.239] 09/02/2015  . Alcohol use disorder, severe, dependence ([place]) [F10.20] 09/02/2015  . 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[place]) [F10.24, F10.229, F32.89] 09/02/2015  . PTSD (post-traumatic stress disorder) [F43.10] 09/01/2015  . Hypothyroid [E03.9] 09/01/2015  . Gastric reflux [K21.9] 09/01/2015  . DDD (degenerative disc disease), lumbosacral [M51.37] 09/01/2015    Total Time spent with patient: 30 minutes  Subjective:   Ivan Hamilton is a 50 y.o. male patient admitted with "I think my mother is coming".  HPI:  Patient interviewed. Chart reviewed. Also talked with his daughter who has been with him this afternoon as well as the current nursing sitter and the RN on duty. Patient himself did not have any real new complaints. I found him to be looking physically fit. He is neatly dressed he is out of bed and moving around the room. He did understand that he was in the hospital and understood the was here because of alcohol abuse problems. He did not seem aware that he had been having visual hallucinations but during the interview he made multiple comments about how there were people working inside the room and then started talking about how his mother was coming to see him. His daughter indicates that all afternoon he has been confused. Seeing people who are not there. Talking about things that don't make any sense. He hasn't been aggressive or violent but nursing also have noted that sometimes he seems to be  having flashbacks and talking about military experiences. Vital signs show a slight elevation of the systolic blood pressure but otherwise looks stable.  Social history: Patient is a native of [place] has been living in the [place] for 30 years. Has full extended family in the area. I'm not aware that his mother is here locally and she certainly wasn't visiting this afternoon.  Medical history: History of hypothyroidism. History of alcohol abuse which is starting to take a larger and larger toll. Recently has started having alcohol withdrawal seizures.  Substance abuse history: Long history of alcohol dependence probably going back 30 years. Our hospital for delirium and alcohol withdrawal problems. He is made some efforts to stop drinking but really hasn't engaged in outpatient treatment very well.    Past Psychiatric History: Past history of posttraumatic stress disorder related to events while he was in the [employer]. Recently he has suffered from more cognitive problems related to alcohol abuse. He's had at least 1 psychiatric hospitalization for PTSD but was a long time ago. He does have one distant episode of suicidal behavior but nothing more recently.  Risk to Self: Suicidal Ideation: No Suicidal Intent: No Is patient at risk for suicide?: No Suicidal Plan?: No Access to Means: No What has been your use of drugs/alcohol within the last 12 months?: Alcohol How many times?: 0 Other Self Harm Risks: Reports of none Triggers for Past Attempts: None known Intentional Self Injurious Behavior: None Risk to Others: Homicidal Ideation: No Thoughts of Harm to Others: No Current Homicidal Intent: No Current Homicidal Plan: No Access to Homicidal Means: No Identified Victim: Reports of none History  of harm to others?: No Assessment of Violence: None Noted Violent Behavior Description: Reports of none Does patient have access to weapons?: No Criminal Charges Pending?: No Does  patient have a court date: No Prior Inpatient Therapy: Prior Inpatient Therapy: No Prior Therapy Dates: n/a Prior Therapy Facilty/Provider(s): n/a Reason for Treatment: n/a Prior Outpatient Therapy: Prior Outpatient Therapy: No Prior Therapy Dates: n/a Prior Therapy Facilty/Provider(s): n/a Reason for Treatment: n/a Does patient have an ACCT team?: No Does patient have Intensive In-House Services?  : No Does patient have [facility] services? : No Does patient have P4CC services?: No  Past Medical History:  Past Medical History  Diagnosis Date  . Alcohol withdrawal ([place])   . Hypothyroidism   . Chronic pain   . Varicose veins   . B12 deficiency     Past Surgical History  Procedure Laterality Date  . Leg surgery     Family History: No family history on file. Family Psychiatric  History: No family history of mental illness identified at all. Social History:  History  Alcohol Use  . Yes    Comment: daily- 1 pint of vodka     History  Drug Use No    Social History   Social History  . Marital Status: Married    Spouse Name: N/A  . Number of Children: N/A  . Years of Education: N/A   Social History Main Topics  . Smoking status: Never Smoker   . Smokeless tobacco: None  . Alcohol Use: Yes     Comment: daily- 1 pint of vodka  . Drug Use: No  . Sexual Activity: Not Asked   Other Topics Concern  . None   Social History Narrative   Additional Social History:    Allergies:  No Known Allergies  Labs:  Results for orders placed or performed during the hospital encounter of 11/02/15 (from the past 48 hour(s))  Comprehensive metabolic panel     Status: Abnormal   Collection Time: 11/02/15  2:27 PM  Result Value Ref Range   Sodium 131 (L) 135 - 145 mmol/L   Potassium 3.5 3.5 - 5.1 mmol/L   Chloride 100 (L) 101 - 111 mmol/L   CO2 23 22 - 32 mmol/L   Glucose, Bld 126 (H) 65 - 99 mg/dL   BUN 13 6 - 20 mg/dL   Creatinine, Ser 0.77 0.61 - 1.24 mg/dL   Calcium 9.6 8.9  - 10.3 mg/dL   Total Protein 8.3 (H) 6.5 - 8.1 g/dL   Albumin 4.5 3.5 - 5.0 g/dL   AST 61 (H) 15 - 41 U/L   ALT 36 17 - 63 U/L   Alkaline Phosphatase 53 38 - 126 U/L   Total Bilirubin 1.3 (H) 0.3 - 1.2 mg/dL   GFR calc non Af Amer >60 >60 mL/min   GFR calc Af Amer >60 >60 mL/min    Comment: (NOTE) The eGFR has been calculated using the CKD EPI equation. This calculation has not been validated in all clinical situations. eGFR's persistently <60 mL/min signify possible Chronic Kidney Disease.    Anion gap 8 5 - 15  Ethanol (ETOH)     Status: None   Collection Time: 11/02/15  2:27 PM  Result Value Ref Range   Alcohol, Ethyl (B) <5 <5 mg/dL    Comment:        LOWEST DETECTABLE LIMIT FOR SERUM ALCOHOL IS 5 mg/dL FOR MEDICAL PURPOSES ONLY   CBC     Status: Abnormal  Collection Time: 11/02/15  2:27 PM  Result Value Ref Range   WBC 5.5 3.8 - 10.6 K/uL   RBC 3.98 (L) 4.40 - 5.90 MIL/uL   Hemoglobin 12.3 (L) 13.0 - 18.0 g/dL   HCT 36.5 (L) 40.0 - 52.0 %   MCV 91.7 80.0 - 100.0 fL   MCH 30.8 26.0 - 34.0 pg   MCHC 33.6 32.0 - 36.0 g/dL   RDW 14.5 11.5 - 14.5 %   Platelets 184 150 - 440 K/uL  Urine Drug Screen, Qualitative ([facility] only)     Status: Abnormal   Collection Time: 11/02/15  2:27 PM  Result Value Ref Range   Tricyclic, Ur Screen NONE DETECTED NONE DETECTED   Amphetamines, Ur Screen NONE DETECTED NONE DETECTED   MDMA (Ecstasy)Ur Screen NONE DETECTED NONE DETECTED   Cocaine Metabolite,Ur [place] NONE DETECTED NONE DETECTED   Opiate, Ur Screen NONE DETECTED NONE DETECTED   Phencyclidine (PCP) Ur S NONE DETECTED NONE DETECTED   Cannabinoid 50 Ng, Ur [place] NONE DETECTED NONE DETECTED   Barbiturates, Ur Screen NONE DETECTED NONE DETECTED   Benzodiazepine, Ur Scrn POSITIVE (A) NONE DETECTED   Methadone Scn, Ur NONE DETECTED NONE DETECTED    Comment: (NOTE) 765  Tricyclics, urine               Cutoff 1000 ng/mL 200  Amphetamines, urine             Cutoff 1000 ng/mL 300  MDMA  (Ecstasy), urine           Cutoff 500 ng/mL 400  Cocaine Metabolite, urine       Cutoff 300 ng/mL 500  Opiate, urine                   Cutoff 300 ng/mL 600  Phencyclidine (PCP), urine      Cutoff 25 ng/mL 700  Cannabinoid, urine              Cutoff 50 ng/mL 800  Barbiturates, urine             Cutoff 200 ng/mL 900  Benzodiazepine, urine           Cutoff 200 ng/mL 1000 Methadone, urine                Cutoff 300 ng/mL 1100 1200 The urine drug screen provides only a preliminary, unconfirmed 1300 analytical test result and should not be used for non-medical 1400 purposes. Clinical consideration and professional judgment should 1500 be applied to any positive drug screen result due to possible 1600 interfering substances. A more specific alternate chemical method 1700 must be used in order to obtain a confirmed analytical result.  1800 Gas chromato graphy / mass spectrometry (GC/MS) is the preferred 1900 confirmatory method.   Basic metabolic panel     Status: Abnormal   Collection Time: 11/03/15  4:32 AM  Result Value Ref Range   Sodium 134 (L) 135 - 145 mmol/L   Potassium 3.8 3.5 - 5.1 mmol/L   Chloride 103 101 - 111 mmol/L   CO2 26 22 - 32 mmol/L   Glucose, Bld 100 (H) 65 - 99 mg/dL   BUN 11 6 - 20 mg/dL   Creatinine, Ser 0.77 0.61 - 1.24 mg/dL   Calcium 9.5 8.9 - 10.3 mg/dL   GFR calc non Af Amer >60 >60 mL/min   GFR calc Af Amer >60 >60 mL/min    Comment: (NOTE) The eGFR has been calculated using the CKD  EPI equation. This calculation has not been validated in all clinical situations. eGFR's persistently <60 mL/min signify possible Chronic Kidney Disease.    Anion gap 5 5 - 15  CBC     Status: Abnormal   Collection Time: 11/03/15  4:32 AM  Result Value Ref Range   WBC 3.8 3.8 - 10.6 K/uL   RBC 3.89 (L) 4.40 - 5.90 MIL/uL   Hemoglobin 12.4 (L) 13.0 - 18.0 g/dL   HCT 36.5 (L) 40.0 - 52.0 %   MCV 93.7 80.0 - 100.0 fL   MCH 31.9 26.0 - 34.0 pg   MCHC 34.1 32.0 - 36.0 g/dL    RDW 15.0 (H) 11.5 - 14.5 %   Platelets 171 150 - 440 K/uL    Current Facility-Administered Medications  Medication Dose Route Frequency Provider Last Rate Last Dose  . acetaminophen (TYLENOL) tablet 650 mg  650 mg Oral Q6H PRN Henreitta Leber, MD   650 mg at 11/03/15 1313   Or  . acetaminophen (TYLENOL) suppository 650 mg  650 mg Rectal Q6H PRN Henreitta Leber, MD      . chlordiazePOXIDE (LIBRIUM) capsule 50 mg  50 mg Oral QHS Gonzella Lex, MD      . enoxaparin (LOVENOX) injection 40 mg  40 mg Subcutaneous Q24H Henreitta Leber, MD   40 mg at 11/02/15 2104  . FLUoxetine (PROZAC) capsule 20 mg  20 mg Oral Daily Henreitta Leber, MD   20 mg at 11/03/15 0813  . folic acid (FOLVITE) tablet 1 mg  1 mg Oral Daily Henreitta Leber, MD   1 mg at 11/03/15 0813  . gabapentin (NEURONTIN) capsule 400 mg  400 mg Oral TID Henreitta Leber, MD   400 mg at 11/03/15 1629  . gadobenate dimeglumine (MULTIHANCE) injection 20 mL  20 mL Intravenous Once PRN Nena Polio, MD      . haloperidol lactate (HALDOL) injection 2.5 mg  2.5 mg Intravenous Q6H PRN Henreitta Leber, MD   2.5 mg at 11/03/15 0142  . levothyroxine (SYNTHROID, LEVOTHROID) tablet 100 mcg  100 mcg Oral QAC breakfast Henreitta Leber, MD   100 mcg at 11/03/15 0813  . LORazepam (ATIVAN) injection 1 mg  1 mg Intravenous Q4H PRN Fritzi Mandes, MD   1 mg at 11/03/15 1751  . LORazepam (ATIVAN) tablet 1 mg  1 mg Oral Q6H PRN Henreitta Leber, MD      . multivitamin with minerals tablet 1 tablet  1 tablet Oral Daily Henreitta Leber, MD   1 tablet at 11/03/15 0813  . naltrexone (DEPADE) tablet 50 mg  50 mg Oral Daily Henreitta Leber, MD   50 mg at 11/03/15 0813  . ondansetron (ZOFRAN) tablet 4 mg  4 mg Oral Q6H PRN Henreitta Leber, MD       Or  . ondansetron (ZOFRAN) injection 4 mg  4 mg Intravenous Q6H PRN Henreitta Leber, MD      . oxyCODONE (Oxy IR/ROXICODONE) immediate release tablet 5 mg  5 mg Oral Q4H PRN Lytle Butte, MD      . pantoprazole (PROTONIX)  EC tablet 40 mg  40 mg Oral Daily Henreitta Leber, MD   40 mg at 11/03/15 0813  . thiamine (VITAMIN B-1) tablet 100 mg  100 mg Oral Daily Henreitta Leber, MD   100 mg at 11/03/15 0813   Or  . thiamine (B-1) injection 100 mg  100 mg Intravenous  Daily Henreitta Leber, MD   100 mg at 11/02/15 1836  . vitamin B-12 (CYANOCOBALAMIN) tablet 100 mcg  100 mcg Oral Daily Lytle Butte, MD   100 mcg at 11/03/15 1629    Musculoskeletal: Strength & Muscle Tone: within normal limits Gait & Station: unsteady Patient leans: N/A  Psychiatric Specialty Exam: Review of Systems  Constitutional: Negative.   HENT: Negative.   Eyes: Negative.   Respiratory: Negative.   Cardiovascular: Negative.   Gastrointestinal: Negative.   Musculoskeletal: Negative.   Skin: Negative.   Neurological: Negative.   Psychiatric/Behavioral: Positive for hallucinations, memory loss and substance abuse. Negative for depression and suicidal ideas. The patient is nervous/anxious. The patient does not have insomnia.     Blood pressure 143/86, pulse 71, temperature 97.9 F (36.6 C), temperature source Oral, resp. rate 20, height '5\' 3"'  (1.6 m), weight 62.732 kg (138 lb 4.8 oz), SpO2 100 %.Body mass index is 24.5 kg/(m^2).  General Appearance: Fairly Groomed  Engineer, water::  Good  Speech:  Garbled  Volume:  Decreased  Mood:  Euthymic  Affect:  Flat  Thought Process:  Loose and Tangential  Orientation:  Full (Time, Place, and Person)  Thought Content:  Hallucinations: Visual  Suicidal Thoughts:  No  Homicidal Thoughts:  No  Memory:  Immediate;   Fair Recent;   Poor Remote;   Fair  Judgement:  Impaired  Insight:  Shallow  Psychomotor Activity:  Normal  Concentration:  Poor  Recall:  Poor  Fund of Knowledge:Fair  Language: Fair  Akathisia:  No  Handed:  Right  AIMS (if indicated):     Assets:  Desire for Improvement Financial Resources/Insurance Housing Physical Health Resilience  ADL's:  Intact  Cognition:  Impaired,  Mild  Sleep:      Treatment Plan Summary: Daily contact with patient to assess and evaluate symptoms and progress in treatment, Medication management and Plan Patient is physically fairly stable but clearly still having delirium tremens. Visual hallucinations confusion and delirium all day all afternoon. I'm concerned that things might get more disruptive with the nighttime. I put in an order for them to get a 1 time dose of 50 mg of Librium to see if we can help him sleep better. Educated the patient and his daughter about the condition and reassured him that things should be getting better over the next couple days. Did a little bit of talk with him about alcohol abuse treatment but I don't think he is in the best mental state for that. I will continue to follow-up.  Disposition: Patient does not meet criteria for psychiatric inpatient admission. Supportive therapy provided about ongoing stressors.  Alethia Berthold, MD 11/03/2015 7:07 PM

## 2015-11-03 NOTE — Progress Notes (Signed)
[facility] [place] NAME: Ivan Hamilton    MRN#:  [id number]  DATE OF BIRTH:  07-Jan-1966  SUBJECTIVE:  Hospital Day: 1 day Ivan Hamilton is a 50 y.o. male presenting with Hallucinations .   Overnight events: No overnight events Interval Events: Patient still complains of leg pain and intermittent headaches as well as stating "I just don't feel right" he denies any current hallucinations but still seems anxious  REVIEW OF SYSTEMS:  CONSTITUTIONAL: No fever, fatigue or weakness.  EYES: No blurred or double vision.  EARS, NOSE, AND THROAT: No tinnitus or ear pain.  RESPIRATORY: No cough, shortness of breath, wheezing or hemoptysis.  CARDIOVASCULAR: No chest pain, orthopnea, edema.  GASTROINTESTINAL: No nausea, vomiting, diarrhea or abdominal pain.  GENITOURINARY: No dysuria, hematuria.  ENDOCRINE: No polyuria, nocturia,  HEMATOLOGY: No anemia, easy bruising or bleeding SKIN: No rash or lesion. MUSCULOSKELETAL: No joint pain or arthritis.   NEUROLOGIC: No tingling, numbness, weakness.  PSYCHIATRY: Positive anxiety denies depression.   DRUG ALLERGIES:  No Known Allergies  VITALS:  Blood pressure 124/82, pulse 66, temperature 97.6 F (36.4 C), temperature source Oral, resp. rate 20, height 5\' 3"  (1.6 m), weight 62.732 kg (138 lb 4.8 oz), SpO2 100 %.  PHYSICAL EXAMINATION:  VITAL SIGNS: Filed Vitals:   11/03/15 0516 11/03/15 0717  BP: 135/88 124/82  Pulse: 73 66  Temp: 97.8 F (36.6 C) 97.6 F (36.4 C)  Resp: 20 20   GENERAL:50 y.o.male currently in no acute distress.  HEAD: Normocephalic, atraumatic.  EYES: Pupils equal, round, reactive to light. Extraocular muscles intact. No scleral icterus.  MOUTH: Moist mucosal membrane. Dentition intact. No abscess noted.  EAR, NOSE, THROAT: Clear without exudates. No external lesions.  NECK: Supple. No thyromegaly. No nodules. No JVD.  PULMONARY: Clear to ascultation,  without wheeze rails or rhonci. No use of accessory muscles, Good respiratory effort. good air entry bilaterally CHEST: Nontender to palpation.  CARDIOVASCULAR: S1 and S2. Regular rate and rhythm. No murmurs, rubs, or gallops. No edema. Pedal pulses 2+ bilaterally.  GASTROINTESTINAL: Soft, nontender, nondistended. No masses. Positive bowel sounds. No hepatosplenomegaly.  MUSCULOSKELETAL: No swelling, clubbing, or edema. Range of motion full in all extremities.  NEUROLOGIC: Cranial nerves II through XII are intact. No gross focal neurological deficits. Sensation intact. Reflexes intact.  SKIN: No ulceration, lesions, rashes, or cyanosis. Skin warm and dry. Turgor intact.  PSYCHIATRIC: Mood, affect flat. The patient is awake, alert and oriented x 3. Insight, judgment intact.      LABORATORY PANEL:   CBC  Recent Labs Lab 11/03/15 0432  WBC 3.8  HGB 12.4*  HCT 36.5*  PLT 171   ------------------------------------------------------------------------------------------------------------------  Chemistries   Recent Labs Lab 11/02/15 1427 11/03/15 0432  NA 131* 134*  K 3.5 3.8  CL 100* 103  CO2 23 26  GLUCOSE 126* 100*  BUN 13 11  CREATININE 0.77 0.77  CALCIUM 9.6 9.5  AST 61*  --   ALT 36  --   ALKPHOS 53  --   BILITOT 1.3*  --    ------------------------------------------------------------------------------------------------------------------  Cardiac Enzymes No results for input(s): TROPONINI in the last 168 hours. ------------------------------------------------------------------------------------------------------------------  RADIOLOGY:  Mr Lumbar Spine Wo Contrast  11/02/2015  CLINICAL DATA:  Low back pain extending into the lower extremities, predominantly on the left. Pain since December 2016 with significant progression over the last week. EXAM: MRI LUMBAR SPINE WITHOUT CONTRAST TECHNIQUE: Multiplanar, multisequence MR imaging of the lumbar spine  was performed.  No intravenous contrast was administered. COMPARISON:  CT of the abdomen and pelvis 11/02/2015. FINDINGS: Normal signal is present in main conus medullaris which terminates at T12-L1, within normal limits. Marrow signal, vertebral body heights, and alignment are normal. Limited imaging the abdomen is unremarkable. The disc levels at L2-3 and above are normal. L3-4: There is mild desiccation of the disc without significant protrusion or stenosis. L4-5: A mild broad-based disc protrusion is present. There is slight distortion of the central canal. Mild prominence of epidural fat is noted. L5-S1: Prominent epidural fat is present. A right paramedian disc protrusion and annular tear are noted. The mild right foraminal narrowing is evident. IMPRESSION: 1. Mild broad-based disc protrusion at L4-5 and L5-S1 without significant central canal compressive stenosis. 2. Mild right foraminal narrowing at L5-S1. 3. 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 Electronically Signed   By: San Morelle M.D.   On: 11/02/2015 17:51   Ct Abdomen Pelvis W Contrast  11/02/2015  CLINICAL DATA:  Left lower abdominal pain. EXAM: CT ABDOMEN AND PELVIS WITH CONTRAST TECHNIQUE: Multidetector CT imaging of the abdomen and pelvis was performed using the standard protocol following bolus administration of intravenous contrast. CONTRAST:  164mL ISOVUE-300 IOPAMIDOL (ISOVUE-300) INJECTION 61% COMPARISON:  None. FINDINGS: Lower chest: There is mild atelectatic change in the posterior right lung base region. Visualized lung base regions otherwise appear clear. There are foci of atherosclerotic calcification in the aorta. There is a hiatal hernia with fluid in the distal esophagus. Hepatobiliary: Liver measures 18.1 cm in length. The liver attenuation is significantly decreased diffusely, likely due to hepatic steatosis. Gallbladder is contracted with wall thickness upper normal. No pericholecystic fluid is evident. There is no biliary duct  dilatation. Pancreas: No pancreatic mass or inflammatory focus. No peripancreatic fluid. No pancreatic duct dilatation. Spleen: No splenic lesions are evident. Adrenals/Urinary Tract: Adrenals appear normal bilaterally. There is a 7 x 5 mm cyst in the anterior mid left kidney. There is slight symmetric fullness of both renal collecting systems, likely due to diffuse urinary bladder distention. There is no renal or ureteral calculus on either side. Urinary bladder is distended with wall thickness within normal limits. Stomach/Bowel: Stomach is distended with fluid and air. There are multiple proximal sigmoid diverticula without appreciable wall thickening or mesenteric thickening to indicate diverticulitis. There is no appreciable bowel wall or mesenteric thickening. Rectum is mildly distended with air. No bowel obstruction. No free air or portal venous air. Vascular/Lymphatic: There is no abdominal aortic aneurysm. The major mesenteric vessels appear patent. There is no adenopathy in the abdomen or pelvis. Reproductive: Prostate and seminal vesicles appear normal. There is no pelvic mass or pelvic fluid collection. Other: Appendix appears normal. No abscess or ascites is evident in the abdomen or pelvis. There is fat in each inguinal ring. Musculoskeletal: There are no blastic or lytic bone lesions. No intramuscular or abdominal wall lesion evident. IMPRESSION: Small hiatal hernia with fluid in the distal esophagus, likely due to reflux. Diffuse decreased liver attenuation. This finding is probably due to hepatic steatosis. The dramatic decrease in liver attenuation could also indicate liver hemochromatosis. Both hepatic steatosis and hemochromatosis may be present concurrently. No pancreatic lesions. Distended urinary bladder. Slight fullness of each renal collecting system is likely due to the distention of the urinary bladder. No obstructing lesions seen in either ureter or renal collecting system. In particular,  no renal or ureteral calculi on either side. Multiple sigmoid diverticula in the proximal sigmoid region. No frank  diverticulitis. No bowel obstruction.  No abscess. Electronically Signed   By: Lowella Grip III M.D.   On: 11/02/2015 16:55    EKG:   Orders placed or performed during the hospital encounter of 06/28/15  . ED EKG  . ED EKG  . EKG 12-Lead  . EKG 12-Lead  . EKG    ASSESSMENT AND PLAN:   Cane Naeem is a 50 y.o. male presenting with Hallucinations . Admitted 11/02/2015 : Day #: 1 day 1. Alcohol withdrawal: Continue on CIWA protocol weaning as tolerated no further hallucinations, slight input appreciated at current rate would anticipate discharge possibly tomorrow 2. Hypothyroidism Synthroid 3. GERD without esophagitis PPI therapy 4. Venous thromboembolism prophylactic: Lovenox   All the records are reviewed and case discussed with Care Management/Social Workerr. Management plans discussed with the patient, family and they are in agreement.  CODE STATUS: full TOTAL TIME TAKING CARE OF THIS PATIENT: 28 minutes.   POSSIBLE D/C IN 1-2DAYS, DEPENDING ON CLINICAL CONDITION.   Calysta Craigo,  Karenann Cai.D on 11/03/2015 at 11:06 AM  Between 7am to 6pm - Pager - [phone]  After 6pm: House Pager: - [phone]  [facility]  Office  [phone]  CC: Primary care physician; Tracie Harrier, MD

## 2015-11-03 NOTE — Plan of Care (Signed)
Problem: Education: Goal: Knowledge of [facility] Education information/materials will improve Outcome: Progressing Spanish speaking pt, pt speaks some English Pt likes to be called Ivan Hamilton    Past Medical History   Diagnosis  Date   .  Alcohol withdrawal ([place])     .  Hypothyroidism     .  Chronic pain     .  Varicose veins     .  B12 deficiency               Pt is well controlled with home medications     Problem: Fluid Volume: Goal: Ability to maintain a balanced intake and output will improve Outcome: Progressing On [place] precautions, Ativan prn per protocol with some relief. Continue to monitor.

## 2015-11-04 MED ORDER — LORAZEPAM 2 MG/ML IJ SOLN
1.0000 mg | Freq: Four times a day (QID) | INTRAMUSCULAR | Status: DC | PRN
  Administered 2015-11-05 (×2): 1 mg via INTRAVENOUS
  Filled 2015-11-04 (×2): qty 1

## 2015-11-04 NOTE — Care Management (Signed)
Admitted to [facility] with the diagnosis of alcohol withdrawal. Lives with wife, Dolph Sotto [phone]). Dr. Ginette Pitman is listed as primary care physician.  [facility] protocol. [facility] MSN CCM Care Management [phone]

## 2015-11-04 NOTE — Consult Note (Signed)
[facility] Face-to-Face Psychiatry Consult   Reason for Consult:  Consult follow-up for this 50 year old man with a history of alcohol abuse and delirium tremens as well as posttraumatic stress disorder Referring Physician:  [facility] Patient Identification: Ivan Hamilton MRN:  [id number] Principal Diagnosis: Delirium tremens Diagnosis:   Patient Active Problem List   Diagnosis Date Noted  . Alcohol withdrawal delirium ([place]) [F10.231]   . Alcohol withdrawal ([place]) [F10.239] 09/02/2015  . Alcohol use disorder, severe, dependence ([place]) [F10.20] 09/02/2015  . 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[place]) [F10.24, F10.229, F32.89] 09/02/2015  . PTSD (post-traumatic stress disorder) [F43.10] 09/01/2015  . Hypothyroid [E03.9] 09/01/2015  . Gastric reflux [K21.9] 09/01/2015  . DDD (degenerative disc disease), lumbosacral [M51.37] 09/01/2015    Total Time spent with patient: 30 minutes  Subjective:   Ivan Hamilton is a 50 y.o. male patient admitted with "I think my mother is coming".  HPI:  Follow-up on Friday the 14th for this 50 year old man with alcohol abuse and delirium tremens. I found the patient by himself in his room today so I don't have any other collateral information except from nursing. Patient says he thinks he is feeling better today. He is able to tell me where he is and who has been in to see him today and it sounds reality based. He is not aware of having had visual hallucinations and does not appear to be as confused. Nursing tells me that they have not seen him be confused or agitated today either. Patient was actually able to have a pretty lucid conversation about outpatient substance abuse treatment. I noticed that he still tremulous in his hands but not to the degree that is impairing him and he appears to be eating well. Vitals stable. Evidently he had one episode of agitation last night but since then has been doing  fine. Patient was actually able to show me on his iPad some information about rehabilitation programs there and investigating.  Social history: Patient is a native of [place] has been living in the [place] for 30 years. Has full extended family in the area. I'm not aware that his mother is here locally and she certainly wasn't visiting this afternoon.  Medical history: History of hypothyroidism. History of alcohol abuse which is starting to take a larger and larger toll. Recently has started having alcohol withdrawal seizures.  Substance abuse history: Long history of alcohol dependence probably going back 30 years. Our hospital for delirium and alcohol withdrawal problems. He is made some efforts to stop drinking but really hasn't engaged in outpatient treatment very well.    Past Psychiatric History: Past history of posttraumatic stress disorder related to events while he was in the [employer]. Recently he has suffered from more cognitive problems related to alcohol abuse. He's had at least 1 psychiatric hospitalization for PTSD but was a long time ago. He does have one distant episode of suicidal behavior but nothing more recently.  Risk to Self: Suicidal Ideation: No Suicidal Intent: No Is patient at risk for suicide?: No Suicidal Plan?: No Access to Means: No What has been your use of drugs/alcohol within the last 12 months?: Alcohol How many times?: 0 Other Self Harm Risks: Reports of none Triggers for Past Attempts: None known Intentional Self Injurious Behavior: None Risk to Others: Homicidal Ideation: No Thoughts of Harm to Others: No Current Homicidal Intent: No Current Homicidal Plan: No Access to Homicidal Means: No Identified Victim: Reports of none  History of harm to others?: No Assessment of Violence: None Noted Violent Behavior Description: Reports of none Does patient have access to weapons?: No Criminal Charges Pending?: No Does patient have a court date:  No Prior Inpatient Therapy: Prior Inpatient Therapy: No Prior Therapy Dates: n/a Prior Therapy Facilty/Provider(s): n/a Reason for Treatment: n/a Prior Outpatient Therapy: Prior Outpatient Therapy: No Prior Therapy Dates: n/a Prior Therapy Facilty/Provider(s): n/a Reason for Treatment: n/a Does patient have an ACCT team?: No Does patient have Intensive In-House Services?  : No Does patient have [facility] services? : No Does patient have P4CC services?: No  Past Medical History:  Past Medical History  Diagnosis Date  . Alcohol withdrawal ([place])   . Hypothyroidism   . Chronic pain   . Varicose veins   . B12 deficiency     Past Surgical History  Procedure Laterality Date  . Leg surgery     Family History: No family history on file. Family Psychiatric  History: No family history of mental illness identified at all. Social History:  History  Alcohol Use  . Yes    Comment: daily- 1 pint of vodka     History  Drug Use No    Social History   Social History  . Marital Status: Married    Spouse Name: N/A  . Number of Children: N/A  . Years of Education: N/A   Social History Main Topics  . Smoking status: Never Smoker   . Smokeless tobacco: None  . Alcohol Use: Yes     Comment: daily- 1 pint of vodka  . Drug Use: No  . Sexual Activity: Not Asked   Other Topics Concern  . None   Social History Narrative   Additional Social History:    Allergies:  No Known Allergies  Labs:  Results for orders placed or performed during the hospital encounter of 11/02/15 (from the past 48 hour(s))  Comprehensive metabolic panel     Status: Abnormal   Collection Time: 11/02/15  2:27 PM  Result Value Ref Range   Sodium 131 (L) 135 - 145 mmol/L   Potassium 3.5 3.5 - 5.1 mmol/L   Chloride 100 (L) 101 - 111 mmol/L   CO2 23 22 - 32 mmol/L   Glucose, Bld 126 (H) 65 - 99 mg/dL   BUN 13 6 - 20 mg/dL   Creatinine, Ser 0.77 0.61 - 1.24 mg/dL   Calcium 9.6 8.9 - 10.3 mg/dL   Total  Protein 8.3 (H) 6.5 - 8.1 g/dL   Albumin 4.5 3.5 - 5.0 g/dL   AST 61 (H) 15 - 41 U/L   ALT 36 17 - 63 U/L   Alkaline Phosphatase 53 38 - 126 U/L   Total Bilirubin 1.3 (H) 0.3 - 1.2 mg/dL   GFR calc non Af Amer >60 >60 mL/min   GFR calc Af Amer >60 >60 mL/min    Comment: (NOTE) The eGFR has been calculated using the CKD EPI equation. This calculation has not been validated in all clinical situations. eGFR's persistently <60 mL/min signify possible Chronic Kidney Disease.    Anion gap 8 5 - 15  Ethanol (ETOH)     Status: None   Collection Time: 11/02/15  2:27 PM  Result Value Ref Range   Alcohol, Ethyl (B) <5 <5 mg/dL    Comment:        LOWEST DETECTABLE LIMIT FOR SERUM ALCOHOL IS 5 mg/dL FOR MEDICAL PURPOSES ONLY   CBC     Status: Abnormal  Collection Time: 11/02/15  2:27 PM  Result Value Ref Range   WBC 5.5 3.8 - 10.6 K/uL   RBC 3.98 (L) 4.40 - 5.90 MIL/uL   Hemoglobin 12.3 (L) 13.0 - 18.0 g/dL   HCT 36.5 (L) 40.0 - 52.0 %   MCV 91.7 80.0 - 100.0 fL   MCH 30.8 26.0 - 34.0 pg   MCHC 33.6 32.0 - 36.0 g/dL   RDW 14.5 11.5 - 14.5 %   Platelets 184 150 - 440 K/uL  Urine Drug Screen, Qualitative ([facility] only)     Status: Abnormal   Collection Time: 11/02/15  2:27 PM  Result Value Ref Range   Tricyclic, Ur Screen NONE DETECTED NONE DETECTED   Amphetamines, Ur Screen NONE DETECTED NONE DETECTED   MDMA (Ecstasy)Ur Screen NONE DETECTED NONE DETECTED   Cocaine Metabolite,Ur [place] NONE DETECTED NONE DETECTED   Opiate, Ur Screen NONE DETECTED NONE DETECTED   Phencyclidine (PCP) Ur S NONE DETECTED NONE DETECTED   Cannabinoid 50 Ng, Ur [place] NONE DETECTED NONE DETECTED   Barbiturates, Ur Screen NONE DETECTED NONE DETECTED   Benzodiazepine, Ur Scrn POSITIVE (A) NONE DETECTED   Methadone Scn, Ur NONE DETECTED NONE DETECTED    Comment: (NOTE) 620  Tricyclics, urine               Cutoff 1000 ng/mL 200  Amphetamines, urine             Cutoff 1000 ng/mL 300  MDMA (Ecstasy), urine            Cutoff 500 ng/mL 400  Cocaine Metabolite, urine       Cutoff 300 ng/mL 500  Opiate, urine                   Cutoff 300 ng/mL 600  Phencyclidine (PCP), urine      Cutoff 25 ng/mL 700  Cannabinoid, urine              Cutoff 50 ng/mL 800  Barbiturates, urine             Cutoff 200 ng/mL 900  Benzodiazepine, urine           Cutoff 200 ng/mL 1000 Methadone, urine                Cutoff 300 ng/mL 1100 1200 The urine drug screen provides only a preliminary, unconfirmed 1300 analytical test result and should not be used for non-medical 1400 purposes. Clinical consideration and professional judgment should 1500 be applied to any positive drug screen result due to possible 1600 interfering substances. A more specific alternate chemical method 1700 must be used in order to obtain a confirmed analytical result.  1800 Gas chromato graphy / mass spectrometry (GC/MS) is the preferred 1900 confirmatory method.   Basic metabolic panel     Status: Abnormal   Collection Time: 11/03/15  4:32 AM  Result Value Ref Range   Sodium 134 (L) 135 - 145 mmol/L   Potassium 3.8 3.5 - 5.1 mmol/L   Chloride 103 101 - 111 mmol/L   CO2 26 22 - 32 mmol/L   Glucose, Bld 100 (H) 65 - 99 mg/dL   BUN 11 6 - 20 mg/dL   Creatinine, Ser 0.77 0.61 - 1.24 mg/dL   Calcium 9.5 8.9 - 10.3 mg/dL   GFR calc non Af Amer >60 >60 mL/min   GFR calc Af Amer >60 >60 mL/min    Comment: (NOTE) The eGFR has been calculated using the CKD  EPI equation. This calculation has not been validated in all clinical situations. eGFR's persistently <60 mL/min signify possible Chronic Kidney Disease.    Anion gap 5 5 - 15  CBC     Status: Abnormal   Collection Time: 11/03/15  4:32 AM  Result Value Ref Range   WBC 3.8 3.8 - 10.6 K/uL   RBC 3.89 (L) 4.40 - 5.90 MIL/uL   Hemoglobin 12.4 (L) 13.0 - 18.0 g/dL   HCT 36.5 (L) 40.0 - 52.0 %   MCV 93.7 80.0 - 100.0 fL   MCH 31.9 26.0 - 34.0 pg   MCHC 34.1 32.0 - 36.0 g/dL   RDW 15.0 (H) 11.5 - 14.5  %   Platelets 171 150 - 440 K/uL    Current Facility-Administered Medications  Medication Dose Route Frequency Provider Last Rate Last Dose  . acetaminophen (TYLENOL) tablet 650 mg  650 mg Oral Q6H PRN Henreitta Leber, MD   650 mg at 11/03/15 1313   Or  . acetaminophen (TYLENOL) suppository 650 mg  650 mg Rectal Q6H PRN Henreitta Leber, MD      . enoxaparin (LOVENOX) injection 40 mg  40 mg Subcutaneous Q24H Henreitta Leber, MD   40 mg at 11/03/15 2115  . FLUoxetine (PROZAC) capsule 20 mg  20 mg Oral Daily Henreitta Leber, MD   20 mg at 11/04/15 0859  . folic acid (FOLVITE) tablet 1 mg  1 mg Oral Daily Henreitta Leber, MD   1 mg at 11/04/15 0859  . gabapentin (NEURONTIN) capsule 400 mg  400 mg Oral TID Henreitta Leber, MD   400 mg at 11/04/15 0859  . gadobenate dimeglumine (MULTIHANCE) injection 20 mL  20 mL Intravenous Once PRN Nena Polio, MD      . haloperidol lactate (HALDOL) injection 2.5 mg  2.5 mg Intravenous Q6H PRN Henreitta Leber, MD   2.5 mg at 11/04/15 0131  . levothyroxine (SYNTHROID, LEVOTHROID) tablet 100 mcg  100 mcg Oral QAC breakfast Henreitta Leber, MD   100 mcg at 11/04/15 0859  . LORazepam (ATIVAN) injection 1 mg  1 mg Intravenous Q6H PRN Lytle Butte, MD      . LORazepam (ATIVAN) tablet 1 mg  1 mg Oral Q6H PRN Henreitta Leber, MD      . multivitamin with minerals tablet 1 tablet  1 tablet Oral Daily Henreitta Leber, MD   1 tablet at 11/04/15 0900  . naltrexone (DEPADE) tablet 50 mg  50 mg Oral Daily Henreitta Leber, MD   50 mg at 11/04/15 0859  . ondansetron (ZOFRAN) tablet 4 mg  4 mg Oral Q6H PRN Henreitta Leber, MD       Or  . ondansetron (ZOFRAN) injection 4 mg  4 mg Intravenous Q6H PRN Henreitta Leber, MD      . oxyCODONE (Oxy IR/ROXICODONE) immediate release tablet 5 mg  5 mg Oral Q4H PRN Lytle Butte, MD      . pantoprazole (PROTONIX) EC tablet 40 mg  40 mg Oral Daily Henreitta Leber, MD   40 mg at 11/04/15 0900  . thiamine (VITAMIN B-1) tablet 100 mg  100 mg  Oral Daily Henreitta Leber, MD   100 mg at 11/04/15 0900   Or  . thiamine (B-1) injection 100 mg  100 mg Intravenous Daily Henreitta Leber, MD   100 mg at 11/02/15 1836  . vitamin B-12 (CYANOCOBALAMIN) tablet 100 mcg  100  mcg Oral Daily Lytle Butte, MD   100 mcg at 11/04/15 8016    Musculoskeletal: Strength & Muscle Tone: within normal limits Gait & Station: unsteady Patient leans: N/A  Psychiatric Specialty Exam: Review of Systems  Constitutional: Negative.   HENT: Negative.   Eyes: Negative.   Respiratory: Negative.   Cardiovascular: Negative.   Gastrointestinal: Negative.   Musculoskeletal: Negative.   Skin: Negative.   Neurological: Negative.   Psychiatric/Behavioral: Positive for memory loss and substance abuse. Negative for depression, suicidal ideas and hallucinations. The patient is not nervous/anxious and does not have insomnia.     Blood pressure 117/75, pulse 72, temperature 98.4 F (36.9 C), temperature source Oral, resp. rate 16, height '5\' 3"'  (1.6 m), weight 62.732 kg (138 lb 4.8 oz), SpO2 97 %.Body mass index is 24.5 kg/(m^2).  General Appearance: Fairly Groomed  Engineer, water::  Good  Speech:  Garbled  Volume:  Decreased  Mood:  Euthymic  Affect:  Flat  Thought Process:  Goal Directed  Orientation:  Full (Time, Place, and Person)  Thought Content:  Negative  Suicidal Thoughts:  No  Homicidal Thoughts:  No  Memory:  Immediate;   Fair Recent;   Poor Remote;   Fair  Judgement:  Impaired  Insight:  Shallow  Psychomotor Activity:  Normal  Concentration:  Poor  Recall:  Poor  Fund of Knowledge:Fair  Language: Fair  Akathisia:  No  Handed:  Right  AIMS (if indicated):     Assets:  Desire for Improvement Financial Resources/Insurance Housing Physical Health Resilience  ADL's:  Intact  Cognition: Impaired,  Mild  Sleep:      Treatment Plan Summary: Daily contact with patient to assess and evaluate symptoms and progress in treatment, Medication  management and Plan No change to medication orders. It's possible that they may be ready to discharge him either today or tomorrow as he looks like he is coming through the worst of it. I spent some time talking with him about the importance of engaging in substance abuse treatment. Reiterated the life or death nature of his alcohol abuse. I've asked to make sure that social work has given him information. He was able to tell me that they haven't found any place so far that would accept Medicare. I believe [facility] use to accept Medicare and I suggested that for his consideration but he also needs to follow-up with [facility]. I would continue the naltrexone for now. I will see him after the weekend if he is still here.  Disposition: Patient does not meet criteria for psychiatric inpatient admission. Supportive therapy provided about ongoing stressors.  Alethia Berthold, MD 11/04/2015 2:23 PM

## 2015-11-04 NOTE — Progress Notes (Signed)
[facility] [place] NAME: Ivan Hamilton    MRN#:  [id number]  DATE OF BIRTH:  1966/02/29  SUBJECTIVE:  Hospital Day: 2 days Ivan Hamilton is a 50 y.o. male presenting with Hallucinations .   Overnight events: No overnight events Interval Events: Family at bedside, still issues with memory however no hallucinations today  REVIEW OF SYSTEMS:  CONSTITUTIONAL: No fever, fatigue or weakness.  EYES: No blurred or double vision.  EARS, NOSE, AND THROAT: No tinnitus or ear pain.  RESPIRATORY: No cough, shortness of breath, wheezing or hemoptysis.  CARDIOVASCULAR: No chest pain, orthopnea, edema.  GASTROINTESTINAL: No nausea, vomiting, diarrhea or abdominal pain.  GENITOURINARY: No dysuria, hematuria.  ENDOCRINE: No polyuria, nocturia,  HEMATOLOGY: No anemia, easy bruising or bleeding SKIN: No rash or lesion. MUSCULOSKELETAL: No joint pain or arthritis.   NEUROLOGIC: No tingling, numbness, weakness.  PSYCHIATRY: Denies anxiety denies depression.   DRUG ALLERGIES:  No Known Allergies  VITALS:  Blood pressure 127/90, pulse 75, temperature 97.7 F (36.5 C), temperature source Oral, resp. rate 18, height 5\' 3"  (1.6 m), weight 62.732 kg (138 lb 4.8 oz), SpO2 98 %.  PHYSICAL EXAMINATION:  VITAL SIGNS: Filed Vitals:   11/04/15 0627 11/04/15 0907  BP: 138/88 127/90  Pulse: 72 75  Temp: 97.9 F (36.6 C) 97.7 F (36.5 C)  Resp: 20 50   GENERAL:49 y.o.male currently in no acute distress.  HEAD: Normocephalic, atraumatic.  EYES: Pupils equal, round, reactive to light. Extraocular muscles intact. No scleral icterus.  MOUTH: Moist mucosal membrane. Dentition intact. No abscess noted.  EAR, NOSE, THROAT: Clear without exudates. No external lesions.  NECK: Supple. No thyromegaly. No nodules. No JVD.  PULMONARY: Clear to ascultation, without wheeze rails or rhonci. No use of accessory muscles, Good respiratory effort. good air entry  bilaterally CHEST: Nontender to palpation.  CARDIOVASCULAR: S1 and S2. Regular rate and rhythm. No murmurs, rubs, or gallops. No edema. Pedal pulses 2+ bilaterally.  GASTROINTESTINAL: Soft, nontender, nondistended. No masses. Positive bowel sounds. No hepatosplenomegaly.  MUSCULOSKELETAL: No swelling, clubbing, or edema. Range of motion full in all extremities.  NEUROLOGIC: Cranial nerves II through XII are intact. No gross focal neurological deficits. Sensation intact. Reflexes intact.  SKIN: No ulceration, lesions, rashes, or cyanosis. Skin warm and dry. Turgor intact.  PSYCHIATRIC: Mood, affect flat. The patient is awake, alert and oriented x 3. Insight, judgment intact.      LABORATORY PANEL:   CBC  Recent Labs Lab 11/03/15 0432  WBC 3.8  HGB 12.4*  HCT 36.5*  PLT 171   ------------------------------------------------------------------------------------------------------------------  Chemistries   Recent Labs Lab 11/02/15 1427 11/03/15 0432  NA 131* 134*  K 3.5 3.8  CL 100* 103  CO2 23 26  GLUCOSE 126* 100*  BUN 13 11  CREATININE 0.77 0.77  CALCIUM 9.6 9.5  AST 61*  --   ALT 36  --   ALKPHOS 53  --   BILITOT 1.3*  --    ------------------------------------------------------------------------------------------------------------------  Cardiac Enzymes No results for input(s): TROPONINI in the last 168 hours. ------------------------------------------------------------------------------------------------------------------  RADIOLOGY:  Mr Lumbar Spine Wo Contrast  11/02/2015  CLINICAL DATA:  Low back pain extending into the lower extremities, predominantly on the left. Pain since December 2016 with significant progression over the last week. EXAM: MRI LUMBAR SPINE WITHOUT CONTRAST TECHNIQUE: Multiplanar, multisequence MR imaging of the lumbar spine was performed. No intravenous contrast was administered. COMPARISON:  CT of the abdomen and pelvis 11/02/2015.  FINDINGS: Normal signal is present in main conus medullaris which terminates at T12-L1, within normal limits. Marrow signal, vertebral body heights, and alignment are normal. Limited imaging the abdomen is unremarkable. The disc levels at L2-3 and above are normal. L3-4: There is mild desiccation of the disc without significant protrusion or stenosis. L4-5: A mild broad-based disc protrusion is present. There is slight distortion of the central canal. Mild prominence of epidural fat is noted. L5-S1: Prominent epidural fat is present. A right paramedian disc protrusion and annular tear are noted. The mild right foraminal narrowing is evident. IMPRESSION: 1. Mild broad-based disc protrusion at L4-5 and L5-S1 without significant central canal compressive stenosis. 2. Mild right foraminal narrowing at L5-S1. 3. 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 Electronically Signed   By: San Morelle M.D.   On: 11/02/2015 17:51   Ct Abdomen Pelvis W Contrast  11/02/2015  CLINICAL DATA:  Left lower abdominal pain. EXAM: CT ABDOMEN AND PELVIS WITH CONTRAST TECHNIQUE: Multidetector CT imaging of the abdomen and pelvis was performed using the standard protocol following bolus administration of intravenous contrast. CONTRAST:  121mL ISOVUE-300 IOPAMIDOL (ISOVUE-300) INJECTION 61% COMPARISON:  None. FINDINGS: Lower chest: There is mild atelectatic change in the posterior right lung base region. Visualized lung base regions otherwise appear clear. There are foci of atherosclerotic calcification in the aorta. There is a hiatal hernia with fluid in the distal esophagus. Hepatobiliary: Liver measures 18.1 cm in length. The liver attenuation is significantly decreased diffusely, likely due to hepatic steatosis. Gallbladder is contracted with wall thickness upper normal. No pericholecystic fluid is evident. There is no biliary duct dilatation. Pancreas: No pancreatic mass or inflammatory focus. No peripancreatic fluid. No pancreatic  duct dilatation. Spleen: No splenic lesions are evident. Adrenals/Urinary Tract: Adrenals appear normal bilaterally. There is a 7 x 5 mm cyst in the anterior mid left kidney. There is slight symmetric fullness of both renal collecting systems, likely due to diffuse urinary bladder distention. There is no renal or ureteral calculus on either side. Urinary bladder is distended with wall thickness within normal limits. Stomach/Bowel: Stomach is distended with fluid and air. There are multiple proximal sigmoid diverticula without appreciable wall thickening or mesenteric thickening to indicate diverticulitis. There is no appreciable bowel wall or mesenteric thickening. Rectum is mildly distended with air. No bowel obstruction. No free air or portal venous air. Vascular/Lymphatic: There is no abdominal aortic aneurysm. The major mesenteric vessels appear patent. There is no adenopathy in the abdomen or pelvis. Reproductive: Prostate and seminal vesicles appear normal. There is no pelvic mass or pelvic fluid collection. Other: Appendix appears normal. No abscess or ascites is evident in the abdomen or pelvis. There is fat in each inguinal ring. Musculoskeletal: There are no blastic or lytic bone lesions. No intramuscular or abdominal wall lesion evident. IMPRESSION: Small hiatal hernia with fluid in the distal esophagus, likely due to reflux. Diffuse decreased liver attenuation. This finding is probably due to hepatic steatosis. The dramatic decrease in liver attenuation could also indicate liver hemochromatosis. Both hepatic steatosis and hemochromatosis may be present concurrently. No pancreatic lesions. Distended urinary bladder. Slight fullness of each renal collecting system is likely due to the distention of the urinary bladder. No obstructing lesions seen in either ureter or renal collecting system. In particular, no renal or ureteral calculi on either side. Multiple sigmoid diverticula in the proximal sigmoid  region. No frank diverticulitis. No bowel obstruction.  No abscess. Electronically Signed   By: Lowella Grip III M.D.  On: 11/02/2015 16:55    EKG:   Orders placed or performed during the hospital encounter of 06/28/15  . ED EKG  . ED EKG  . EKG 12-Lead  . EKG 12-Lead  . EKG    ASSESSMENT AND PLAN:   Ivan Hamilton is a 50 y.o. male presenting with Hallucinations . Admitted 11/02/2015 : Day #: 2 days 1. Alcohol withdrawal: Continue on CIWA protocol weaning as tolerated, space out frequency of Ativan every 6 hours,  no further hallucinations, psych input appreciated at current rate would anticipate discharge tomorrow 2. Hypothyroidism Synthroid 3. GERD without esophagitis PPI therapy 4. Venous thromboembolism prophylactic: Lovenox   All the records are reviewed and case discussed with Care Management/Social Workerr. Management plans discussed with the patient, family and they are in agreement.  CODE STATUS: full TOTAL TIME TAKING CARE OF THIS PATIENT: 28 minutes.   POSSIBLE D/C IN 1-2DAYS, DEPENDING ON CLINICAL CONDITION.   Hower,  Karenann Cai.D on 11/04/2015 at 11:56 AM  Between 7am to 6pm - Pager - [phone]  After 6pm: House Pager: - [phone]  [facility]  Office  [phone]  CC: Primary care physician; Tracie Harrier, MD

## 2015-11-05 MED ORDER — LORAZEPAM 1 MG PO TABS: 1.0000 mg | ORAL_TABLET | Freq: Two times a day (BID) | ORAL | Status: DC | PRN

## 2015-11-05 MED ORDER — ADULT MULTIVITAMIN W/MINERALS CH: 1.0000 | ORAL_TABLET | Freq: Every day | ORAL | Status: DC

## 2015-11-05 NOTE — Discharge Summary (Signed)
[facility] [place] NAME: Ivan Hamilton    MR#:  [id number]  DATE OF BIRTH:  10/07/1965  DATE OF ADMISSION:  11/02/2015 ADMITTING PHYSICIAN: Henreitta Leber, MD  DATE OF DISCHARGE: 11/05/2015  PRIMARY CARE PHYSICIAN: Tracie Harrier, MD    ADMISSION DIAGNOSIS:  Alcohol withdrawal delirium ([place]) [F10.231] Pain [R52]  DISCHARGE DIAGNOSIS:  Active Problems:   Alcohol withdrawal ([place])   Alcohol withdrawal delirium ([place])   SECONDARY DIAGNOSIS:   Past Medical History  Diagnosis Date  . Alcohol withdrawal ([place])   . Hypothyroidism   . Chronic pain   . Varicose veins   . B12 deficiency     HOSPITAL COURSE:  Ivan Hamilton  is a 50 y.o. male admitted 11/02/2015 with chief complaint Hallucinations . Please see H&P performed by Henreitta Leber, MD for further information. He has a known history of alcohol abuse and dependence. Had withdrawal symptoms on admission requiring CIWA protocol with tapering doses of ativan. He was evaluated by psychiatry during his hospital stay who aided in the withdrawal process. Information provided regarding outpatient rehab  DISCHARGE CONDITIONS:   stable  CONSULTS OBTAINED:  Treatment Team:  Gonzella Lex, MD Lytle Butte, MD  DRUG ALLERGIES:  No Known Allergies  DISCHARGE MEDICATIONS:   Current Discharge Medication List    START taking these medications   Details  Multiple Vitamin (MULTIVITAMIN WITH MINERALS) TABS tablet Take 1 tablet by mouth daily. Qty: 30 tablet, Refills: 0      CONTINUE these medications which have CHANGED   Details  LORazepam (ATIVAN) 1 MG tablet Take 1 tablet (1 mg total) by mouth 2 (two) times daily as needed for anxiety. Qty: 30 tablet, Refills: 0      CONTINUE these medications which have NOT CHANGED   Details  FLUoxetine (PROZAC) 20 MG capsule Take 1 capsule (20 mg total) by mouth daily. Qty: 30 capsule, Refills: 0    folic acid (FOLVITE) 1 MG  tablet Take 1 mg by mouth daily.    gabapentin (NEURONTIN) 400 MG capsule Take 1 capsule (400 mg total) by mouth 3 (three) times daily. Qty: 90 capsule, Refills: 0    levothyroxine (SYNTHROID, LEVOTHROID) 100 MCG tablet Take 100 mcg by mouth daily before breakfast.    naltrexone (DEPADE) 50 MG tablet Take 1 tablet (50 mg total) by mouth daily. Qty: 30 tablet, Refills: 0    pantoprazole (PROTONIX) 40 MG tablet Take 1 tablet (40 mg total) by mouth daily. Qty: 30 tablet, Refills: 0    thiamine (VITAMIN B-1) 100 MG tablet Take 100 mg by mouth daily.    vitamin B-12 (CYANOCOBALAMIN) 100 MCG tablet Take 100 mcg by mouth daily.         DISCHARGE INSTRUCTIONS:    DIET:  Regular diet  DISCHARGE CONDITION:  Good  ACTIVITY:  Activity as tolerated  OXYGEN:  Home Oxygen: No.   Oxygen Delivery: room air  DISCHARGE LOCATION:  home   If you experience worsening of your admission symptoms, develop shortness of breath, life threatening emergency, suicidal or homicidal thoughts you must seek medical attention immediately by calling 911 or calling your MD immediately  if symptoms less severe.  You Must read complete instructions/literature along with all the possible adverse reactions/side effects for all the Medicines you take and that have been prescribed to you. Take any new Medicines after you have completely understood and accpet all the possible adverse reactions/side effects.   Please note  You were cared for by a hospitalist during your hospital stay. If you have any questions about your discharge medications or the care you received while you were in the hospital after you are discharged, you can call the unit and asked to speak with the hospitalist on call if the hospitalist that took care of you is not available. Once you are discharged, your primary care physician will handle any further medical issues. Please note that NO REFILLS for any discharge medications will be  authorized once you are discharged, as it is imperative that you return to your primary care physician (or establish a relationship with a primary care physician if you do not have one) for your aftercare needs so that they can reassess your need for medications and monitor your lab values.    On the day of Discharge:   VITAL SIGNS:  Blood pressure 125/90, pulse 75, temperature 98 F (36.7 C), temperature source Oral, resp. rate 18, height 5\' 3"  (1.6 m), weight 62.732 kg (138 lb 4.8 oz), SpO2 100 %.  I/O:   Intake/Output Summary (Last 24 hours) at 11/05/15 0844 Last data filed at 11/04/15 1800  Gross per 24 hour  Intake   1200 ml  Output      0 ml  Net   1200 ml    PHYSICAL EXAMINATION:  GENERAL:  50 y.o.-year-old patient lying in the bed with no acute distress.  EYES: Pupils equal, round, reactive to light and accommodation. No scleral icterus. Extraocular muscles intact.  HEENT: Head atraumatic, normocephalic. Oropharynx and nasopharynx clear.  NECK:  Supple, no jugular venous distention. No thyroid enlargement, no tenderness.  LUNGS: Normal breath sounds bilaterally, no wheezing, rales,rhonchi or crepitation. No use of accessory muscles of respiration.  CARDIOVASCULAR: S1, S2 normal. No murmurs, rubs, or gallops.  ABDOMEN: Soft, non-tender, non-distended. Bowel sounds present. No organomegaly or mass.  EXTREMITIES: No pedal edema, cyanosis, or clubbing.  NEUROLOGIC: Cranial nerves II through XII are intact. Muscle strength 5/5 in all extremities. Sensation intact. Gait not checked.  PSYCHIATRIC: The patient is alert and oriented x 3.  SKIN: No obvious rash, lesion, or ulcer.   DATA REVIEW:   CBC  Recent Labs Lab 11/03/15 0432  WBC 3.8  HGB 12.4*  HCT 36.5*  PLT 171    Chemistries   Recent Labs Lab 11/02/15 1427 11/03/15 0432  NA 131* 134*  K 3.5 3.8  CL 100* 103  CO2 23 26  GLUCOSE 126* 100*  BUN 13 11  CREATININE 0.77 0.77  CALCIUM 9.6 9.5  AST 61*  --    ALT 36  --   ALKPHOS 53  --   BILITOT 1.3*  --     Cardiac Enzymes No results for input(s): TROPONINI in the last 168 hours.  Microbiology Results  No results found for this or any previous visit.  RADIOLOGY:  No results found.   Management plans discussed with the patient, family and they are in agreement.  CODE STATUS:     Code Status Orders        Start     Ordered   11/02/15 2001  Full code   Continuous     11/02/15 2000    Code Status History    Date Active Date Inactive Code Status Order ID Comments User Context   09/01/2015  4:50 PM 09/05/2015  4:48 PM Full Code [id number]  Gonzella Lex, MD Inpatient      TOTAL TIME TAKING CARE OF THIS PATIENT:  28 minutes.    Hower,  Karenann Cai.D on 11/05/2015 at 8:44 AM  Between 7am to 6pm - Pager - [phone]  After 6pm go to [email or web address] - [employer] [facility]  Office  [phone]  CC: Primary care physician; Tracie Harrier, MD

## 2015-11-05 NOTE — Progress Notes (Signed)
Received MD order to discharge patient to home, reviewed discharge instructions home meds and prescriptions with patient and wife and both verbalized understanding discharged to home in wheelchair with nursing

## 2015-11-05 NOTE — Clinical Social Work Note (Signed)
Clinical Social Work Assessment  Patient Details  Name: Ivan Hamilton MRN: [id number] Date of Birth: 18-May-1966  Date of referral:  11/04/15               Reason for consult:  Substance Use/ETOH Abuse                Permission sought to share information with:    Permission granted to share information::     Name::        Agency::     Relationship::     Contact Information:     Housing/Transportation Living arrangements for the past 2 months:  Single Family Home Source of Information:  Patient Patient Interpreter Needed:  None Criminal Activity/Legal Involvement Pertinent to Current Situation/Hospitalization:  No - Comment as needed Significant Relationships:  Spouse Lives with:  Minor Children, Spouse Do you feel safe going back to the place where you live?  Yes Need for family participation in patient care:  Yes (Comment)  Care giving concerns:  Patient lives in [place] with his wife and 2 daughters.    Social Worker assessment / plan:  Holiday representative ([place]) received consult to provide patient with substance abuse treatment options. Per MD patient will D/C home today. CSW met with patient this morning and he was alone at bedside. Patient was fully dressed and standing up and walking around the room independently. CSW introduced self and explained role of CSW department. Patient was alert and oriented. Per patient he lives with his wife and 2 daughters in [place]. Patient reported that he drinks half a bottle of tequila per day and is very interested in getting some help. Patient reported that he would like to get into a long term residential treatment program. CSW provided patient with inpatient treatment options such has [facility] and [facility] in [place] explained that [facility] is a 2 year residential treatment program where you live, work and go to treatment. CSW provided patient with address and phone number to [facility] and encouraged him to follow up. CSW also gave patient  outpatient treatment options such as [facility] and [facility] in [place]. Patient appeared motivated to follow up on treatment options. Please reconsult if future social work needs arise. CSW signing off.   Employment status:  Disabled (Comment on whether or not currently receiving Disability) Insurance information:  Medicaid In [place] PT Recommendations:  Not assessed at this time Information / Referral to community resources:  Outpatient Substance Abuse Treatment Options, Residential Substance Abuse Treatment Options  Patient/Family's Response to care:  Patient appeared motivated to follow up on treatment options.   Patient/Family's Understanding of and Emotional Response to Diagnosis, Current Treatment, and Prognosis:  Patient was very pleasant throughout assessment and thanked CSW for visit.   Emotional Assessment Appearance:  Appears stated age Attitude/Demeanor/Rapport:    Affect (typically observed):  Accepting, Adaptable, Pleasant Orientation:  Oriented to Self, Oriented to Place, Oriented to  Time, Oriented to Situation Alcohol / Substance use:  Alcohol Use Psych involvement (Current and /or in the community):  Yes (Comment) (Per Psych does not meet inpatient psych admission criteria. )  Discharge Needs  Concerns to be addressed:  No discharge needs identified Readmission within the last 30 days:    Current discharge risk:  None Barriers to Discharge:  No Barriers Identified   Loralyn Freshwater, LCSW 11/05/2015, 9:42 AM

## 2016-02-01 ENCOUNTER — Encounter: Payer: Self-pay | Admitting: Emergency Medicine

## 2016-02-01 ENCOUNTER — Emergency Department
Admission: EM | Admit: 2016-02-01 | Discharge: 2016-02-02 | Disposition: A | Discharge status: Home / Self Care | Payer: Medicaid Other | Attending: Emergency Medicine | Admitting: Emergency Medicine

## 2016-02-01 DIAGNOSIS — F10939 Alcohol use, unspecified with withdrawal, unspecified: Secondary | ICD-10-CM | POA: Diagnosis present

## 2016-02-01 DIAGNOSIS — E039 Hypothyroidism, unspecified: Secondary | ICD-10-CM | POA: Insufficient documentation

## 2016-02-01 DIAGNOSIS — K219 Gastro-esophageal reflux disease without esophagitis: Secondary | ICD-10-CM | POA: Diagnosis present

## 2016-02-01 DIAGNOSIS — F22 Delusional disorders: Secondary | ICD-10-CM | POA: Diagnosis present

## 2016-02-01 DIAGNOSIS — F10239 Alcohol dependence with withdrawal, unspecified: Secondary | ICD-10-CM | POA: Diagnosis present

## 2016-02-01 DIAGNOSIS — F1020 Alcohol dependence, uncomplicated: Secondary | ICD-10-CM | POA: Diagnosis present

## 2016-02-01 DIAGNOSIS — Z79899 Other long term (current) drug therapy: Secondary | ICD-10-CM | POA: Diagnosis not present

## 2016-02-01 DIAGNOSIS — F4310 Post-traumatic stress disorder, unspecified: Secondary | ICD-10-CM | POA: Diagnosis present

## 2016-02-01 DIAGNOSIS — Z7952 Long term (current) use of systemic steroids: Secondary | ICD-10-CM | POA: Insufficient documentation

## 2016-02-01 DIAGNOSIS — M5127 Other intervertebral disc displacement, lumbosacral region: Secondary | ICD-10-CM | POA: Insufficient documentation

## 2016-02-01 DIAGNOSIS — F1094 Alcohol use, unspecified with alcohol-induced mood disorder: Secondary | ICD-10-CM | POA: Diagnosis not present

## 2016-02-01 DIAGNOSIS — F4312 Post-traumatic stress disorder, chronic: Secondary | ICD-10-CM | POA: Diagnosis present

## 2016-02-01 DIAGNOSIS — IMO0002 Reserved for concepts with insufficient information to code with codable children: Secondary | ICD-10-CM

## 2016-02-01 LAB — CBC
HCT: 44 % (ref 40.0–52.0)
Hemoglobin: 14.9 g/dL (ref 13.0–18.0)
MCH: 31.3 pg (ref 26.0–34.0)
MCHC: 33.8 g/dL (ref 32.0–36.0)
MCV: 92.6 fL (ref 80.0–100.0)
Platelets: 134 10*3/uL — ABNORMAL LOW (ref 150–440)
RBC: 4.76 MIL/uL (ref 4.40–5.90)
RDW: 14.3 % (ref 11.5–14.5)
WBC: 7.4 10*3/uL (ref 3.8–10.6)

## 2016-02-01 LAB — COMPREHENSIVE METABOLIC PANEL
ALT: 28 U/L (ref 17–63)
AST: 37 U/L (ref 15–41)
Albumin: 5.1 g/dL — ABNORMAL HIGH (ref 3.5–5.0)
Alkaline Phosphatase: 58 U/L (ref 38–126)
Anion gap: 15 (ref 5–15)
BUN: 9 mg/dL (ref 6–20)
CO2: 25 mmol/L (ref 22–32)
Calcium: 9 mg/dL (ref 8.9–10.3)
Chloride: 101 mmol/L (ref 101–111)
Creatinine, Ser: 0.84 mg/dL (ref 0.61–1.24)
GFR calc Af Amer: >60 mL/min (ref >60)
GFR calc non Af Amer: >60 mL/min (ref >60)
Glucose, Bld: 102 mg/dL — ABNORMAL HIGH (ref 65–99)
Potassium: 4.2 mmol/L (ref 3.5–5.1)
Sodium: 141 mmol/L (ref 135–145)
Total Bilirubin: 1.1 mg/dL (ref 0.3–1.2)
Total Protein: 8.8 g/dL — ABNORMAL HIGH (ref 6.5–8.1)

## 2016-02-01 LAB — ETHANOL: Alcohol, Ethyl (B): 445 mg/dL (ref <5)

## 2016-02-01 MED ORDER — SODIUM CHLORIDE 0.9 % IV BOLUS (SEPSIS)
1000.0000 mL | Freq: Once | INTRAVENOUS | Status: AC
  Administered 2016-02-01: 1000 mL via INTRAVENOUS

## 2016-02-01 NOTE — ED Notes (Signed)
Pt ex-wife phone number [phone]

## 2016-02-01 NOTE — ED Provider Notes (Signed)
Time Seen: Approximately 1850  I have reviewed the triage notes  Chief Complaint: Addiction Problem; Post-Traumatic Stress Disorder; and Delusional   History of Present Illness: Ivan Hamilton is a 50 y.o. male *who presents with paper from [place] [facility]. The patient apparently was referred here by Dr. Lovena Le who reports that the patient will require admission for alcohol detox and he generally will start drinking as soon as a detox areas over her. Dr. Lovena Le reports patient's had delusions and paranoid thoughts of people watching him and attacking him. Insulin fairly and presents acutely inebriated. Patient denies any physical complaints. He has had a history of alcohol withdrawal delirium.   Past Medical History  Diagnosis Date  . Alcohol withdrawal ([place])   . Hypothyroidism   . Chronic pain   . Varicose veins   . B12 deficiency     Patient Active Problem List   Diagnosis Date Noted  . Alcohol withdrawal delirium ([place])   . Alcohol withdrawal ([place]) 09/02/2015  . Alcohol use disorder, severe, dependence ([place]) 09/02/2015  . 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[place]) 09/02/2015  . PTSD (post-traumatic stress disorder) 09/01/2015  . Hypothyroid 09/01/2015  . Gastric reflux 09/01/2015  . DDD (degenerative disc disease), lumbosacral 09/01/2015    Past Surgical History  Procedure Laterality Date  . Leg surgery      Past Surgical History  Procedure Laterality Date  . Leg surgery      Current Outpatient Rx  Name  Route  Sig  Dispense  Refill  . gabapentin (NEURONTIN) 400 MG capsule   Oral   Take 1 capsule (400 mg total) by mouth 3 (three) times daily.   90 capsule   0   . levothyroxine (SYNTHROID, LEVOTHROID) 100 MCG tablet   Oral   Take 100 mcg by mouth daily before breakfast.         . LORazepam (ATIVAN) 1 MG tablet   Oral   Take 1 tablet (1 mg total) by mouth 2 (two) times daily as  needed for anxiety.   30 tablet   0   . predniSONE (DELTASONE) 10 MG tablet   Oral   Take 10 mg by mouth See admin instructions. Taper 3,3,3,2,2,2,1,1,1         . risperiDONE (RISPERDAL) 0.5 MG tablet   Oral   Take 0.5 mg by mouth at bedtime.         Marland Kitchen FLUoxetine (PROZAC) 20 MG capsule   Oral   Take 1 capsule (20 mg total) by mouth daily. Patient not taking: Reported on 02/01/2016   30 capsule   0   . naltrexone (DEPADE) 50 MG tablet   Oral   Take 1 tablet (50 mg total) by mouth daily. Patient not taking: Reported on 02/01/2016   30 tablet   0     Allergies:  Review of patient's allergies indicates no known allergies.  Family History: No family history on file.  Social History: Social History  Substance Use Topics  . Smoking status: Never Smoker   . Smokeless tobacco: None  . Alcohol Use: Yes     Comment: daily- 1 pint of vodka     Review of Systems:   10 point review of systems was performed and was otherwise negative:  Constitutional: No fever Eyes: No visual disturbances ENT: No sore throat, ear pain Cardiac: No chest pain Respiratory: No shortness of breath, wheezing, or stridor Abdomen: No abdominal pain, no vomiting,  No diarrhea Endocrine: No weight loss, No night sweats Extremities: No peripheral edema, cyanosis Skin: No rashes, easy bruising Neurologic: No focal weakness, trouble with speech or swollowing Urologic: No dysuria, Hematuria, or urinary frequency   Physical Exam:  ED Triage Vitals  Enc Vitals Group     BP 02/01/16 1744 136/102 mmHg     Pulse Rate 02/01/16 1744 116     Resp 02/01/16 1744 18     Temp 02/01/16 1744 98.1 F (36.7 C)     Temp Source 02/01/16 1744 Oral     SpO2 02/01/16 1744 96 %     Weight 02/01/16 1744 138 lb (62.596 kg)     Height 02/01/16 1744 5\' 5"  (1.651 m)     Head Cir --      Peak Flow --      Pain Score --      Pain Loc --      Pain Edu? --      Excl. in [place]? --     General: Awake , Alert , and  Oriented times 3; GCS 15 Head: Normal cephalic , atraumatic Eyes: Pupils equal , round, reactive to light Nose/Throat: No nasal drainage, patent upper airway without erythema or exudate.  Neck: Supple, Full range of motion, No anterior adenopathy or palpable thyroid masses Lungs: Clear to ascultation without wheezes , rhonchi, or rales Heart: Regular rate, regular rhythm without murmurs , gallops , or rubs Abdomen: Soft, non tender without rebound, guarding , or rigidity; bowel sounds positive and symmetric in all 4 quadrants. No organomegaly .        Extremities: 2 plus symmetric pulses. No edema, clubbing or cyanosis Neurologic: normal ambulation, Motor symmetric without deficits, sensory intact Skin: warm, dry, no rashes   Labs:   All laboratory work was reviewed including any pertinent negatives or positives listed below:  Labs Reviewed  COMPREHENSIVE METABOLIC PANEL - Abnormal; Notable for the following:    Glucose, Bld 102 (*)    Total Protein 8.8 (*)    Albumin 5.1 (*)    All other components within normal limits  ETHANOL - Abnormal; Notable for the following:    Alcohol, Ethyl (B) 445 (*)    All other components within normal limits  CBC - Abnormal; Notable for the following:    Platelets 134 (*)    All other components within normal limits  URINE DRUG SCREEN, QUALITATIVE ([facility] ONLY)  ETHANOL  Patient's blood alcohol level was 445.   ED Course:  Patient has had involuntary commitment paperwork filled out for her commitment. Patient's daily inebriated and will be have to be monitored until he reaches the point of sobriety. Patient has a history of alcohol withdrawal. Currently he is hemodynamically stable. Patient has consultation orders established for TTS and also psychiatry    Assessment:  Posttraumatic stress disease Alcohol abuse   Final Clinical Impression: *  Final diagnoses:  Alcohol-induced mood disorder [facility])     Plan: Catcher  consultation            Daymon Larsen, MD 02/01/16 2336

## 2016-02-01 NOTE — ED Notes (Addendum)
Pt presents to ED for alcohol detox and placement in residential treatment facility. Pt saw his psychiatrist, Dr. Mitchel Honour today. Dr. Lovena Le reports pt has been admitted before for alcohol detox and immediately begins drinking again. Dr. Lovena Le reports pt has delusions of people watching him and attacking him. Pt presents voluntarily. Pt denies SI/HI. Pt states his last alcoholic drink was this morning and he drank two cups of tequila.

## 2016-02-02 DIAGNOSIS — F1020 Alcohol dependence, uncomplicated: Secondary | ICD-10-CM

## 2016-02-02 LAB — URINE DRUG SCREEN, QUALITATIVE (ARMC ONLY)
Amphetamines, Ur Screen: NOT DETECTED
Barbiturates, Ur Screen: NOT DETECTED
Benzodiazepine, Ur Scrn: NOT DETECTED
Cannabinoid 50 Ng, Ur ~~LOC~~: NOT DETECTED
Cocaine Metabolite,Ur ~~LOC~~: NOT DETECTED
MDMA (Ecstasy)Ur Screen: NOT DETECTED
Methadone Scn, Ur: NOT DETECTED
Opiate, Ur Screen: NOT DETECTED
Phencyclidine (PCP) Ur S: NOT DETECTED
Tricyclic, Ur Screen: NOT DETECTED

## 2016-02-02 LAB — ETHANOL: Alcohol, Ethyl (B): 304 mg/dL (ref <5)

## 2016-02-02 MED ORDER — LORAZEPAM 2 MG PO TABS
2.0000 mg | ORAL_TABLET | Freq: Once | ORAL | Status: AC
  Administered 2016-02-02: 2 mg via ORAL
  Filled 2016-02-02: qty 1

## 2016-02-02 MED ORDER — LORAZEPAM 1 MG PO TABS
1.0000 mg | ORAL_TABLET | Freq: Once | ORAL | Status: AC
  Administered 2016-02-02: 1 mg via ORAL

## 2016-02-02 MED ORDER — ACETAMINOPHEN 500 MG PO TABS
1000.0000 mg | ORAL_TABLET | Freq: Once | ORAL | Status: AC
  Administered 2016-02-02: 1000 mg via ORAL
  Filled 2016-02-02: qty 2

## 2016-02-02 MED ORDER — LORAZEPAM 1 MG PO TABS
ORAL_TABLET | ORAL | Status: AC
  Filled 2016-02-02: qty 1

## 2016-02-02 NOTE — BH Assessment (Signed)
Assessment Note  Ivan Hamilton is an 50 y.o. male presenting  with papers from [facility]. Pt was referred by Dr. Lovena Le who reports that the patient will require admission for alcohol detox as he generally will start drinking as soon as he's finished with detox.  Dr. Lovena Le reports patient's had delusions and paranoid thoughts of people watching him and attacking him. Pt denies having delusions and paranoid thoughts. He states that he has a bed available at [facility] in [facility].  Diagnosis: Alcohol Intoxication  Past Medical History:  Past Medical History  Diagnosis Date  . Alcohol withdrawal ([place])   . Hypothyroidism   . Chronic pain   . Varicose veins   . B12 deficiency     Past Surgical History  Procedure Laterality Date  . Leg surgery      Family History: No family history on file.  Social History:  reports that he has never smoked. He does not have any smokeless tobacco history on file. He reports that he drinks alcohol. He reports that he does not use illicit drugs.  Additional Social History:  Alcohol / Drug Use History of alcohol / drug use?: Yes Substance #1 Name of Substance 1: etoh 1 - Age of First Use: can't remember 1 - Amount (size/oz): varies 1 - Frequency: daily 1 - Duration: all day 1 - Last Use / Amount: 02/01/2016  CIWA: CIWA-Ar BP: (!) 130/97 mmHg Pulse Rate: 75 COWS:    Allergies: No Known Allergies  Home Medications:  (Not in a hospital admission)  OB/GYN Status:  No LMP for male patient.  General Assessment Data Location of Assessment: [facility] ED TTS Assessment: In system Is this a Tele or Face-to-Face Assessment?: Face-to-Face Is this an Initial Assessment or a Re-assessment for this encounter?: Initial Assessment Marital status: Married [place] name: na Is patient pregnant?: No Pregnancy Status: No Living Arrangements: Spouse/significant other, Children Can pt return to current living arrangement?: Yes Admission Status:  Voluntary Is patient capable of signing voluntary admission?: Yes Referral Source: Self/Family/Friend Insurance type: Medicaid  Medical Screening Exam ([place]) Medical Exam completed: Yes  Crisis Care Plan Living Arrangements: Spouse/significant other, Children Legal Guardian: Other: (self) Name of Psychiatrist: na Name of Therapist: na  Education Status Is patient currently in school?: No Current Grade: na Highest grade of school patient has completed: na Name of school: na Contact person: na  Risk to self with the past 6 months Suicidal Ideation: No Has patient been a risk to self within the past 6 months prior to admission? : No Suicidal Intent: No Has patient had any suicidal intent within the past 6 months prior to admission? : No Is patient at risk for suicide?: No Suicidal Plan?: No Has patient had any suicidal plan within the past 6 months prior to admission? : No Access to Means: No What has been your use of drugs/alcohol within the last 12 months?: alcohol daily Previous Attempts/Gestures: No How many times?: 0 Other Self Harm Risks: alcohol addiction Triggers for Past Attempts: None known Intentional Self Injurious Behavior: None Family Suicide History: No Recent stressful life event(s): Other (Comment) Persecutory voices/beliefs?: No Depression: No Substance abuse history and/or treatment for substance abuse?: Yes Suicide prevention information given to non-admitted patients: Not applicable  Risk to Others within the past 6 months Homicidal Ideation: No Does patient have any lifetime risk of violence toward others beyond the six months prior to admission? : No Thoughts of Harm to Others: No Current Homicidal Intent:  No Current Homicidal Plan: No Access to Homicidal Means: No Identified Victim: none identified History of harm to others?: No Assessment of Violence: None Noted Violent Behavior Description: none identified Does patient have  access to weapons?: No Criminal Charges Pending?: No Does patient have a court date: No Is patient on probation?: No  Psychosis Hallucinations: None noted Delusions: None noted  Mental Status Report Appearance/Hygiene: In scrubs Eye Contact: Good Motor Activity: Freedom of movement Speech: Logical/coherent Level of Consciousness: Alert Mood: Anxious Affect: Appropriate to circumstance Anxiety Level: Minimal Thought Processes: Coherent, Relevant Judgement: Partial Orientation: Person, Place, Time, Situation Obsessive Compulsive Thoughts/Behaviors: None  Cognitive Functioning Concentration: Normal Memory: Recent Intact, Remote Intact IQ: Average Insight: Fair Impulse Control: Fair Appetite: Good Weight Loss: 0 Weight Gain: 0 Sleep: No Change Vegetative Symptoms: None  ADLScreening [facility] Assessment Services) Patient's cognitive ability adequate to safely complete daily activities?: Yes Patient able to express need for assistance with ADLs?: Yes Independently performs ADLs?: Yes (appropriate for developmental age)  Prior Inpatient Therapy Prior Inpatient Therapy: No Prior Therapy Dates: na Prior Therapy Facilty/Provider(s): na Reason for Treatment: na  Prior Outpatient Therapy Prior Outpatient Therapy: No Prior Therapy Dates: na Prior Therapy Facilty/Provider(s): na Reason for Treatment: na Does patient have an ACCT team?: No Does patient have Intensive In-House Services?  : No Does patient have [facility] services? : No Does patient have P4CC services?: No  ADL Screening (condition at time of admission) Patient's cognitive ability adequate to safely complete daily activities?: Yes Patient able to express need for assistance with ADLs?: Yes Independently performs ADLs?: Yes (appropriate for developmental age)       Abuse/Neglect Assessment (Assessment to be complete while patient is alone) Physical Abuse: Denies Verbal Abuse: Denies Sexual Abuse:  Denies Exploitation of patient/patient's resources: Denies Self-Neglect: Denies Values / Beliefs Cultural Requests During Hospitalization: None Spiritual Requests During Hospitalization: None Consults Spiritual Care Consult Needed: No Social Work Consult Needed: No      Additional Information 1:1 In Past 12 Months?: No CIRT Risk: No Elopement Risk: No Does patient have medical clearance?: Yes     Disposition:  Disposition Initial Assessment Completed for this Encounter: Yes Disposition of Patient: Other dispositions Other disposition(s): Other (Comment) (Pt reports he has a bed at [facility] in [facility])  On Site Evaluation by:   Reviewed with Physician:    Oneita Hurt 02/02/2016 1:09 AM

## 2016-02-02 NOTE — ED Provider Notes (Signed)
Patient has been cleared by psychiatry for discharge and has been taken off of involuntary commitment. He has declined alcohol detox. He did receive a dose of Ativan for withdrawal symptoms. He is medically stable for discharge at this time.  Earleen Newport, MD 02/02/16 1155

## 2016-02-02 NOTE — ED Notes (Signed)
MD notified of patient's elevated HR. MD wrote orders for Ativan prior to patient departure. Will continue to monitor. Pt and his wife made aware of 20 min med hold at this time.

## 2016-02-02 NOTE — Discharge Instructions (Signed)
Trastorno por abuso de alcohol (Alcohol Use Disorder) El trastorno por abuso de alcohol es un trastorno mental. No se trata de un incidente nico en el que se bebe en abundancia. Este trastorno se debe al consumo incontrolable del alcohol a lo largo del [place], que causa dificultades en una o ms reas de la vida diaria. Al consumir en exceso, las personas que sufren este trastorno estn en riesgo de daarse a s mismos y a Lobbyist. Tambin puede causar otros trastornos [place], como trastornos del [place] de nimo y trastornos de [place], y 51 fsicos graves. Las personas que sufren trastornos por consumo de alcohol, generalmente abusan de otras sustancias.  Los trastornos por consumo de alcohol son frecuentes y se han generalizado. Algunas personas beben alcohol para poder enfrentarse o escapar a las situaciones negativas de la vida. Otros beben para Best boy crnico o los sntomas de una enfermedad mental. Las personas con historia familiar de trastornos por consumo de alcohol tienen ms riesgo de perder el control y de consumir alcohol en exceso.  Beber mucho alcohol puede ser causa de traumatismos, accidentes y problemas de [place]. Un trago puede ser demasiado si:  Problemas laborales.  Est embarazada o amamantando.  Est tomando medicamentos. Consulte a su mdico.  Est conduciendo o va a hacerlo. SNTOMAS  Los signos y sntomas pueden ser:   Consumo dealcohol engrandes cantidades o durante perodos ms prolongados que lo previsto.  Mltiples intentos fallidos de dejar de bebero controlar el consumo de alcohol.   Emplear gran cantidad de tiempo para obtener, consumir o recuperarse de los efectos del alcohol (resaca).  Un deseo intenso o necesidad de consumir alcohol (abstinencia).   Continuar consumiendo alcohol a pesar de los [employer], en la escuela o en el hogar debido al consumo.   Continuar consumiendo a Arboriculturist de [employer] de relacin  debido al consumo.  Continuar consumiendo an en situaciones que impliquen un peligro fsico, como al conducir un vehculo.  Continuar consumiendo a pesar de saber que un problema fsico o psicolgico probablemente se relaciones con el consumo de alcohol. Los problemas fsicos relacionados con el consumo de alcohol pueden producirse en el cerebro, el corazn, el hgado, el estmago y los intestinos. Los problemas psicolgicos relacionados con el consumo de alcohol incluyen intoxicacin, depresin, ansiedad, psicosis, delirio y Air cabin crew.   La necesidad de aumentar la cantidad de alcohol para Control and instrumentation engineer deseado, o una disminucin del efecto al consumir la misma cantidad de alcohol (tolerancia).  Sndrome de abstinencia al reducir o Architectural technologist consumo, o tener que consumir alcohol para reducir o English as a second language teacher sndrome de abstinencia. Los sntomas de la abstinencia incluyen:  Frecuencia cardaca acelerada.  Temblores en las manos.  Dificultad para dormir.  Nuseas.  Vmitos.  Alucinaciones.  Agitacin.  Convulsiones. DIAGNSTICO  El mdico es el encargado de diagnosticar el trastorno por consumo de alcohol. Comenzar hacindole tres o cuatro preguntas para evaluar si consume alcohol en forma excesiva o si representa un problema. Para confirmar el diagnstico de trastorno por consumo de alcohol, deben estar presentes al [place] un perodo de 12 meses. La gravedad del trastorno depende del nmero de sntomas:   Leve--dos o tres.  Moderado--cuatro o cinco.  Grave--seis o ms. El mdico podr Water quality scientist un examen fsico o [employer] los [place] de las pruebas de laboratorio para ver si usted tiene problemas fsicos como resultado del consumo de alcohol. Podr derivarlo a Civil Service fast streamer de la salud mental para  que realice una evaluacin.  TRATAMIENTO  Algunas personas podrn reducir el consumo a niveles en los que haya un bajo riesgo. Otras personas deben dejar de  beber. Cuando sea necesario, podr recibir ayuda de los profesionales de la salud mental, capacitados en el tratamiento del abuso de sustancias. El mdico podr informarlo sobre la gravedad de su trastorno por consumo de alcohol y decidir qu tipo de tratamiento necesita. Las formas de tratamiento disponibles son:   Desintoxicacin. La desintoxicacin consiste en el uso de medicamentos recetados para evitar el sndrome de abstinencia durante la primera semana en la que se deja de consumir. Esto es importante para las personas con historia de sndrome de abstinencia y para los que consumen en exceso, quienes probablemente sufran el sndrome de abstinencia. El sndrome de abstinencia puede ser peligroso y en algunos casos puede causar la [place]. La desintoxicacin generalmente se realiza internado en un hospital o en una institucin para pacientes que abusan de sustancias.  Asesoramiento psicolgico o psicoterapia. La psicoterapia la realizan terapeutas especializados en el tratamiento de pacientes que abusan de sustancias. Se evalan los motivos que llevan a consumir alcohol, y los modos para Mining engineer a consumir. Los [employer] de la psicoterapia son ayudar a que las personas con trastornos por consumo de alcohol encuentren actividades saludables y [employer] de Systems analyst estrs de la vida, a identificar y Barista disparadores que originan el consumo de alcohol y a Aeronautical engineer abstinencia, que puede originar una recada.  Medicamentos.Hay diferentes medicamentos que pueden ayudar a tratar el trastorno por consumo de alcohol a travs de los siguientes efectos:  Controlan la ansiedad por consumir.  Disminuyen la respuesta de recompensa positiva que se siente al consumir.  Causan una reaccin fsica desagradable cuando se consume alcohol (terapia por aversin).  Grupos de apoyo. Los grupos de apoyo son dirigidos por personas que han dejado de consumir. Ellos brindan apoyo emocional, consejo y  [place]. Estas formas de tratamiento generalmente se combinan. Algunas personas se benefician con el tratamiento combinado intensivo que se ofrece en algunos centros de tratamiento especializados en el abuso de sustancias. Existen programas para pacientes hospitalizados o ambulatorios.    Esta informacin no tiene Marine scientist el consejo del mdico. Asegrese de hacerle al mdico cualquier pregunta que tenga.   Document Released: 07/09/2005 Document Revised: 07/30/2014 Elsevier Interactive Patient Education [employer].

## 2016-02-02 NOTE — Consult Note (Signed)
[facility] Consult   Reason for Consult:  Consult for 50 year old man with alcohol abuse Referring Physician:  Jimmye Norman Patient Identification: Ivan Hamilton MRN:  [id number] Principal Diagnosis: Alcohol use disorder, severe, dependence ([place]) Diagnosis:   Patient Active Problem List   Diagnosis Date Noted  . Alcohol withdrawal delirium ([place]) [F10.231]   . Alcohol withdrawal ([place]) [F10.239] 09/02/2015  . Alcohol use disorder, severe, dependence ([place]) [F10.20] 09/02/2015  . 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[place]) [F10.24, F10.229, F32.89] 09/02/2015  . PTSD (post-traumatic stress disorder) [F43.10] 09/01/2015  . Hypothyroid [E03.9] 09/01/2015  . Gastric reflux [K21.9] 09/01/2015  . DDD (degenerative disc disease), lumbosacral [M51.37] 09/01/2015    Total Time spent with patient: 1 hour  Subjective:   Ivan Hamilton is a 50 y.o. male patient admitted with "I've been back to drinking".  HPI:  Patient interviewed. Chart reviewed. Case discussed with emergency room physician. Patient well known from previous encounters. 50 year old man with alcohol abuse and posttraumatic stress disorder. He reports that he's been drinking heavily for about the last month. He presented with a blood alcohol level last night over 400. He says that he had gone to a 14 day rehabilitation program and stayed sober for a couple weeks afterwards but then he lost his job and started to feel bored at home so he has relapsed. He is not drugs. He says that he has been going to see a doctor in [place] who is going to set him up to go to inpatient rehabilitation again. He was told that he needed to be "detoxed". Patient denies any suicidal thoughts. She is not having any hallucinations or any acute evidence of delirium. Mood is been stable and not depressed.  Medical history: Hypothyroidism gastric reflux. Otherwise stable medical  problems.  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 Has lived in the [place] for over 20 years. Lives with his wife and children. Good social support at home.  Substance abuse history: History of alcohol abuse going back decades. He does have a history of delirium tremens we have witnessed here in the hospital before. Not abuse other drugs. Has engaged in some substance abuse treatment before.  Past Psychiatric History: Patient has a history of posttraumatic stress disorder and has been treated with medication and therapy. No recent suicide attempts. No psychosis.  Risk to Self: Suicidal Ideation: No Suicidal Intent: No Is patient at risk for suicide?: No Suicidal Plan?: No Access to Means: No What has been your use of drugs/alcohol within the last 12 months?: alcohol daily How many times?: 0 Other Self Harm Risks: alcohol addiction Triggers for Past Attempts: None known Intentional Self Injurious Behavior: None Risk to Others: Homicidal Ideation: No Thoughts of Harm to Others: No Current Homicidal Intent: No Current Homicidal Plan: No Access to Homicidal Means: No Identified Victim: none identified History of harm to others?: No Assessment of Violence: None Noted Violent Behavior Description: none identified Does patient have access to weapons?: No Criminal Charges Pending?: No Does patient have a court date: No Prior Inpatient Therapy: Prior Inpatient Therapy: No Prior Therapy Dates: na Prior Therapy Facilty/Provider(s): na Reason for Treatment: na Prior Outpatient Therapy: Prior Outpatient Therapy: No Prior Therapy Dates: na Prior Therapy Facilty/Provider(s): na Reason for Treatment: na Does patient have an ACCT team?: No Does patient have Intensive In-House Services?  : No Does patient have [facility] services? : No Does patient have P4CC services?: No  Past Medical History:  Past Medical History  Diagnosis Date  . Alcohol withdrawal ([place])   .  Hypothyroidism   . Chronic pain   . Varicose veins   . B12 deficiency     Past Surgical History  Procedure Laterality Date  . Leg surgery     Family History: No family history on file. Family Psychiatric  History: Positive for alcohol abuse Social History:  History  Alcohol Use  . Yes    Comment: daily- 1 pint of vodka     History  Drug Use No    Social History   Social History  . Marital Status: Married    Spouse Name: N/A  . Number of Children: N/A  . Years of Education: N/A   Social History Main Topics  . Smoking status: Never Smoker   . Smokeless tobacco: None  . Alcohol Use: Yes     Comment: daily- 1 pint of vodka  . Drug Use: No  . Sexual Activity: Not Asked   Other Topics Concern  . None   Social History Narrative   Additional Social History:    Allergies:  No Known Allergies  Labs:  Results for orders placed or performed during the hospital encounter of 02/01/16 (from the past 48 hour(s))  Comprehensive metabolic panel     Status: Abnormal   Collection Time: 02/01/16  5:44 PM  Result Value Ref Range   Sodium 141 135 - 145 mmol/L   Potassium 4.2 3.5 - 5.1 mmol/L   Chloride 101 101 - 111 mmol/L   CO2 25 22 - 32 mmol/L   Glucose, Bld 102 (H) 65 - 99 mg/dL   BUN 9 6 - 20 mg/dL   Creatinine, Ser 0.84 0.61 - 1.24 mg/dL   Calcium 9.0 8.9 - 10.3 mg/dL   Total Protein 8.8 (H) 6.5 - 8.1 g/dL   Albumin 5.1 (H) 3.5 - 5.0 g/dL   AST 37 15 - 41 U/L   ALT 28 17 - 63 U/L   Alkaline Phosphatase 58 38 - 126 U/L   Total Bilirubin 1.1 0.3 - 1.2 mg/dL   GFR calc non Af Amer >60 >60 mL/min   GFR calc Af Amer >60 >60 mL/min    Comment: (NOTE) The eGFR has been calculated using the CKD EPI equation. This calculation has not been validated in all clinical situations. eGFR's persistently <60 mL/min signify possible Chronic Kidney Disease.    Anion gap 15 5 - 15  Ethanol     Status: Abnormal   Collection Time: 02/01/16  5:44 PM  Result Value Ref Range    Alcohol, Ethyl (B) 445 (HH) <5 mg/dL    Comment: CRITICAL RESULT CALLED TO, READ BACK BY AND VERIFIED WITH JENNIFER INGERSOLL AT 1843 02/01/16 MLZ        LOWEST DETECTABLE LIMIT FOR SERUM ALCOHOL IS 5 mg/dL FOR MEDICAL PURPOSES ONLY   cbc     Status: Abnormal   Collection Time: 02/01/16  5:44 PM  Result Value Ref Range   WBC 7.4 3.8 - 10.6 K/uL   RBC 4.76 4.40 - 5.90 MIL/uL   Hemoglobin 14.9 13.0 - 18.0 g/dL   HCT 44.0 40.0 - 52.0 %   MCV 92.6 80.0 - 100.0 fL   MCH 31.3 26.0 - 34.0 pg   MCHC 33.8 32.0 - 36.0 g/dL   RDW 14.3 11.5 - 14.5 %   Platelets 134 (L) 150 - 440 K/uL  Ethanol     Status: Abnormal   Collection Time: 02/01/16 11:31  PM  Result Value Ref Range   Alcohol, Ethyl (B) 304 (HH) <5 mg/dL    Comment: CRITICAL RESULT CALLED TO, READ BACK BY AND VERIFIED WITH VANESSA ASHLEY ON 02/02/16 AT 0026 BY TLB        LOWEST DETECTABLE LIMIT FOR SERUM ALCOHOL IS 5 mg/dL FOR MEDICAL PURPOSES ONLY   Urine Drug Screen, Qualitative     Status: None   Collection Time: 02/02/16  1:59 AM  Result Value Ref Range   Tricyclic, Ur Screen NONE DETECTED NONE DETECTED   Amphetamines, Ur Screen NONE DETECTED NONE DETECTED   MDMA (Ecstasy)Ur Screen NONE DETECTED NONE DETECTED   Cocaine Metabolite,Ur [place] NONE DETECTED NONE DETECTED   Opiate, Ur Screen NONE DETECTED NONE DETECTED   Phencyclidine (PCP) Ur S NONE DETECTED NONE DETECTED   Cannabinoid 50 Ng, Ur [place] NONE DETECTED NONE DETECTED   Barbiturates, Ur Screen NONE DETECTED NONE DETECTED   Benzodiazepine, Ur Scrn NONE DETECTED NONE DETECTED   Methadone Scn, Ur NONE DETECTED NONE DETECTED    Comment: (NOTE) 034  Tricyclics, urine               Cutoff 1000 ng/mL 200  Amphetamines, urine             Cutoff 1000 ng/mL 300  MDMA (Ecstasy), urine           Cutoff 500 ng/mL 400  Cocaine Metabolite, urine       Cutoff 300 ng/mL 500  Opiate, urine                   Cutoff 300 ng/mL 600  Phencyclidine (PCP), urine      Cutoff 25 ng/mL 700   Cannabinoid, urine              Cutoff 50 ng/mL 800  Barbiturates, urine             Cutoff 200 ng/mL 900  Benzodiazepine, urine           Cutoff 200 ng/mL 1000 Methadone, urine                Cutoff 300 ng/mL 1100 1200 The urine drug screen provides only a preliminary, unconfirmed 1300 analytical test result and should not be used for non-medical 1400 purposes. Clinical consideration and professional judgment should 1500 be applied to any positive drug screen result due to possible 1600 interfering substances. A more specific alternate chemical method 1700 must be used in order to obtain a confirmed analytical result.  1800 Gas chromato graphy / mass spectrometry (GC/MS) is the preferred 1900 confirmatory method.     No current facility-administered medications for this encounter.   Current Outpatient Prescriptions  Medication Sig Dispense Refill  . gabapentin (NEURONTIN) 400 MG capsule Take 1 capsule (400 mg total) by mouth 3 (three) times daily. 90 capsule 0  . levothyroxine (SYNTHROID, LEVOTHROID) 100 MCG tablet Take 100 mcg by mouth daily before breakfast.    . LORazepam (ATIVAN) 1 MG tablet Take 1 tablet (1 mg total) by mouth 2 (two) times daily as needed for anxiety. 30 tablet 0  . predniSONE (DELTASONE) 10 MG tablet Take 10 mg by mouth See admin instructions. Taper 3,3,3,2,2,2,1,1,1    . risperiDONE (RISPERDAL) 0.5 MG tablet Take 0.5 mg by mouth at bedtime.    Marland Kitchen FLUoxetine (PROZAC) 20 MG capsule Take 1 capsule (20 mg total) by mouth daily. (Patient not taking: Reported on 02/01/2016) 30 capsule 0  . naltrexone (DEPADE) 50 MG tablet  Take 1 tablet (50 mg total) by mouth daily. (Patient not taking: Reported on 02/01/2016) 30 tablet 0    Musculoskeletal: Strength & Muscle Tone: within normal limits Gait & Station: unsteady Patient leans: N/A  Psychiatric Specialty Exam: Physical Exam  Nursing note and vitals reviewed. Constitutional: He appears well-developed and  well-nourished.  HENT:  Head: Normocephalic and atraumatic.  Eyes: Conjunctivae are normal. Pupils are equal, round, and reactive to light.  Neck: Normal range of motion.  Cardiovascular: Regular rhythm and normal heart sounds.   Respiratory: Effort normal. No respiratory distress.  GI: Soft.  Musculoskeletal: Normal range of motion.  Neurological: He is alert.  Skin: Skin is warm and dry.  Psychiatric: Thought content normal. His affect is blunt. His speech is delayed. He is slowed. Thought content is not delusional. Cognition and memory are normal. He expresses impulsivity. He expresses no suicidal ideation.    Review of Systems  Constitutional: Negative.   HENT: Negative.   Eyes: Negative.   Respiratory: Negative.   Cardiovascular: Negative.   Gastrointestinal: Positive for nausea.  Musculoskeletal: Negative.   Skin: Negative.   Neurological: Positive for dizziness.  Psychiatric/Behavioral: Positive for substance abuse. Negative for depression, suicidal ideas, hallucinations and memory loss. The patient has insomnia. The patient is not nervous/anxious.     Blood pressure 115/74, pulse 71, temperature 98.2 F (36.8 C), temperature source Oral, resp. rate 18, height 5' 5" (1.651 m), weight 62.596 kg (138 lb), SpO2 98 %.Body mass index is 22.96 kg/(m^2).  General Appearance: Casual  Eye Contact:  Fair  Speech:  Slow  Volume:  Decreased  Mood:  Euthymic  Affect:  Blunt  Thought Process:  Goal Directed  Orientation:  Full (Time, Place, and Person)  Thought Content:  Logical  Suicidal Thoughts:  No  Homicidal Thoughts:  No  Memory:  Immediate;   Good Recent;   Fair Remote;   Fair  Judgement:  Fair  Insight:  Fair  Psychomotor Activity:  Decreased  Concentration:  Concentration: Fair  Recall:  [employer] of Knowledge:  Fair  Language:  Fair  Akathisia:  Negative  Handed:  Right  AIMS (if indicated):     Assets:  Communication Skills Desire for Improvement Financial  Resources/Insurance Housing Physical Health Resilience Social Support  ADL's:  Intact  Cognition:  WNL  Sleep:        Treatment Plan Summary: Medication management and Plan Patient came into the hospital intoxicated. On interview today he is less intoxicated. He is not having hallucinations. He is not delirious. Affect is basically euthymic. No evidence of suicidal thinking. Patient understands he has an alcohol abuse problem and he understands that he has had bad withdrawal in the past. He is not however acutely threatening to himself. I counseled him about our recommendations. I suggested to him that we look into finding a brief detox bed such as at an observation bed. Patient states that he does not want to do that. We went over this several times and he states he prefers to go home even though he knows there is a risk of DTs. Patient has been strongly counseled to discontinue alcohol abuse and come back to the hospital if he has seizures or if he is getting delirious. He does not meet commitment criteria. Case reviewed with emergency room physician. He can be discharged from the emergency room.  Disposition: Patient does not meet criteria for psychiatric inpatient admission.  Alethia Berthold, MD 02/02/2016 12:28 PM

## 2016-02-06 ENCOUNTER — Emergency Department: Payer: Medicaid Other

## 2016-02-06 ENCOUNTER — Encounter: Payer: Self-pay | Admitting: *Deleted

## 2016-02-06 ENCOUNTER — Emergency Department
Admission: EM | Admit: 2016-02-06 | Discharge: 2016-02-08 | Disposition: A | Discharge status: Home / Self Care | Payer: Medicaid Other | Attending: Emergency Medicine | Admitting: Emergency Medicine

## 2016-02-06 DIAGNOSIS — IMO0002 Reserved for concepts with insufficient information to code with codable children: Secondary | ICD-10-CM

## 2016-02-06 DIAGNOSIS — F10239 Alcohol dependence with withdrawal, unspecified: Secondary | ICD-10-CM | POA: Diagnosis present

## 2016-02-06 DIAGNOSIS — M5137 Other intervertebral disc degeneration, lumbosacral region: Secondary | ICD-10-CM | POA: Diagnosis not present

## 2016-02-06 DIAGNOSIS — F1024 Alcohol dependence with alcohol-induced mood disorder: Secondary | ICD-10-CM

## 2016-02-06 DIAGNOSIS — Y929 Unspecified place or not applicable: Secondary | ICD-10-CM | POA: Diagnosis not present

## 2016-02-06 DIAGNOSIS — F1020 Alcohol dependence, uncomplicated: Secondary | ICD-10-CM | POA: Diagnosis present

## 2016-02-06 DIAGNOSIS — W25XXXA Contact with sharp glass, initial encounter: Secondary | ICD-10-CM | POA: Diagnosis not present

## 2016-02-06 DIAGNOSIS — Z7952 Long term (current) use of systemic steroids: Secondary | ICD-10-CM | POA: Diagnosis not present

## 2016-02-06 DIAGNOSIS — F10229 Alcohol dependence with intoxication, unspecified: Secondary | ICD-10-CM | POA: Diagnosis not present

## 2016-02-06 DIAGNOSIS — Z79899 Other long term (current) drug therapy: Secondary | ICD-10-CM | POA: Insufficient documentation

## 2016-02-06 DIAGNOSIS — F10929 Alcohol use, unspecified with intoxication, unspecified: Secondary | ICD-10-CM

## 2016-02-06 DIAGNOSIS — Y998 Other external cause status: Secondary | ICD-10-CM | POA: Insufficient documentation

## 2016-02-06 DIAGNOSIS — S61216A Laceration without foreign body of right little finger without damage to nail, initial encounter: Secondary | ICD-10-CM | POA: Insufficient documentation

## 2016-02-06 DIAGNOSIS — F10129 Alcohol abuse with intoxication, unspecified: Secondary | ICD-10-CM | POA: Insufficient documentation

## 2016-02-06 DIAGNOSIS — Y9389 Activity, other specified: Secondary | ICD-10-CM | POA: Insufficient documentation

## 2016-02-06 DIAGNOSIS — F10939 Alcohol use, unspecified with withdrawal, unspecified: Secondary | ICD-10-CM | POA: Diagnosis present

## 2016-02-06 DIAGNOSIS — E039 Hypothyroidism, unspecified: Secondary | ICD-10-CM | POA: Insufficient documentation

## 2016-02-06 DIAGNOSIS — F3289 Other specified depressive episodes: Secondary | ICD-10-CM

## 2016-02-06 DIAGNOSIS — F4310 Post-traumatic stress disorder, unspecified: Secondary | ICD-10-CM | POA: Diagnosis present

## 2016-02-06 DIAGNOSIS — F4312 Post-traumatic stress disorder, chronic: Secondary | ICD-10-CM | POA: Diagnosis present

## 2016-02-06 DIAGNOSIS — Z046 Encounter for general psychiatric examination, requested by authority: Secondary | ICD-10-CM | POA: Diagnosis present

## 2016-02-06 LAB — COMPREHENSIVE METABOLIC PANEL
ALT: 36 U/L (ref 17–63)
AST: 49 U/L — ABNORMAL HIGH (ref 15–41)
Albumin: 5.5 g/dL — ABNORMAL HIGH (ref 3.5–5.0)
Alkaline Phosphatase: 52 U/L (ref 38–126)
Anion gap: 14 (ref 5–15)
BUN: 8 mg/dL (ref 6–20)
CO2: 23 mmol/L (ref 22–32)
Calcium: 9.5 mg/dL (ref 8.9–10.3)
Chloride: 103 mmol/L (ref 101–111)
Creatinine, Ser: 0.72 mg/dL (ref 0.61–1.24)
GFR calc Af Amer: >60 mL/min (ref >60)
GFR calc non Af Amer: >60 mL/min (ref >60)
Glucose, Bld: 112 mg/dL — ABNORMAL HIGH (ref 65–99)
Potassium: 3.5 mmol/L (ref 3.5–5.1)
Sodium: 140 mmol/L (ref 135–145)
Total Bilirubin: 0.9 mg/dL (ref 0.3–1.2)
Total Protein: 9 g/dL — ABNORMAL HIGH (ref 6.5–8.1)

## 2016-02-06 LAB — CBC
HCT: 45.4 % (ref 40.0–52.0)
Hemoglobin: 15.1 g/dL (ref 13.0–18.0)
MCH: 31 pg (ref 26.0–34.0)
MCHC: 33.3 g/dL (ref 32.0–36.0)
MCV: 93.2 fL (ref 80.0–100.0)
Platelets: 156 10*3/uL (ref 150–440)
RBC: 4.87 MIL/uL (ref 4.40–5.90)
RDW: 15.4 % — ABNORMAL HIGH (ref 11.5–14.5)
WBC: 6.8 10*3/uL (ref 3.8–10.6)

## 2016-02-06 LAB — ETHANOL: Alcohol, Ethyl (B): 413 mg/dL (ref <5)

## 2016-02-06 MED ORDER — LIDOCAINE HCL (PF) 1 % IJ SOLN
10.0000 mL | Freq: Once | INTRAMUSCULAR | Status: AC
  Administered 2016-02-06: 10 mL
  Filled 2016-02-06: qty 10

## 2016-02-06 MED ORDER — CEPHALEXIN 500 MG PO CAPS: 500.0000 mg | ORAL_CAPSULE | Freq: Four times a day (QID) | ORAL | Status: DC

## 2016-02-06 MED ORDER — ADULT MULTIVITAMIN W/MINERALS CH
1.0000 | ORAL_TABLET | Freq: Every day | ORAL | Status: DC
Start: 2016-02-06 — End: 2016-02-08
  Administered 2016-02-06 – 2016-02-08 (×3): 1 via ORAL
  Filled 2016-02-06 (×3): qty 1

## 2016-02-06 MED ORDER — LORAZEPAM 2 MG/ML IJ SOLN: 1.0000 mg | Freq: Four times a day (QID) | INTRAMUSCULAR | Status: DC | PRN

## 2016-02-06 MED ORDER — TETANUS-DIPHTH-ACELL PERTUSSIS 5-2.5-18.5 LF-MCG/0.5 IM SUSP
0.5000 mL | Freq: Once | INTRAMUSCULAR | Status: AC
  Administered 2016-02-06: 0.5 mL via INTRAMUSCULAR
  Filled 2016-02-06: qty 0.5

## 2016-02-06 MED ORDER — TETANUS-DIPHTH-ACELL PERTUSSIS 5-2.5-18.5 LF-MCG/0.5 IM SUSP
INTRAMUSCULAR | Status: AC
  Filled 2016-02-06: qty 0.5

## 2016-02-06 MED ORDER — LORAZEPAM 1 MG PO TABS
1.0000 mg | ORAL_TABLET | ORAL | Status: AC
  Administered 2016-02-06: 1 mg via ORAL
  Filled 2016-02-06: qty 1

## 2016-02-06 MED ORDER — THIAMINE HCL 100 MG/ML IJ SOLN
100.0000 mg | Freq: Every day | INTRAMUSCULAR | Status: DC
Start: 2016-02-06 — End: 2016-02-08

## 2016-02-06 MED ORDER — CEPHALEXIN 500 MG PO CAPS
500.0000 mg | ORAL_CAPSULE | Freq: Four times a day (QID) | ORAL | Status: DC
  Administered 2016-02-07 – 2016-02-08 (×6): 500 mg via ORAL
  Filled 2016-02-06 (×6): qty 1

## 2016-02-06 MED ORDER — LIDOCAINE-EPINEPHRINE (PF) 1 %-1:200000 IJ SOLN
10.0000 mL | Freq: Once | INTRAMUSCULAR | Status: DC
  Filled 2016-02-06: qty 30

## 2016-02-06 MED ORDER — VITAMIN B-1 100 MG PO TABS
100.0000 mg | ORAL_TABLET | Freq: Every day | ORAL | Status: DC
  Administered 2016-02-06 – 2016-02-08 (×3): 100 mg via ORAL
  Filled 2016-02-06 (×3): qty 1

## 2016-02-06 MED ORDER — LORAZEPAM 1 MG PO TABS
1.0000 mg | ORAL_TABLET | Freq: Four times a day (QID) | ORAL | Status: DC | PRN
  Administered 2016-02-07 – 2016-02-08 (×3): 1 mg via ORAL
  Filled 2016-02-06 (×5): qty 1

## 2016-02-06 MED ORDER — HALOPERIDOL 5 MG PO TABS
5.0000 mg | ORAL_TABLET | ORAL | Status: AC
  Administered 2016-02-06: 5 mg via ORAL
  Filled 2016-02-06: qty 1

## 2016-02-06 MED ORDER — CEPHALEXIN 500 MG PO CAPS
500.0000 mg | ORAL_CAPSULE | Freq: Once | ORAL | Status: AC
  Administered 2016-02-06: 500 mg via ORAL
  Filled 2016-02-06: qty 1

## 2016-02-06 MED ORDER — FOLIC ACID 1 MG PO TABS
1.0000 mg | ORAL_TABLET | Freq: Every day | ORAL | Status: DC
  Administered 2016-02-06 – 2016-02-08 (×3): 1 mg via ORAL
  Filled 2016-02-06 (×3): qty 1

## 2016-02-06 NOTE — ED Provider Notes (Signed)
Patient assessment performed by attending physician Dr. Jacqualine Code. I performed the below procedures for patient.   LACERATION REPAIR Performed by: Darletta Moll Authorized by: Charline Bills Clifton Safley Consent: Verbal consent obtained. Risks and benefits: risks, benefits and alternatives were discussed Consent given by: patient Patient identity confirmed: provided demographic data Prepped and Draped in normal sterile fashion Wound explored  Laceration Location: Distal aspect of the fifth digit right hand  Laceration Length: 2 cm, transverse across the pad of the fifth digit right hand causing a flap.  No Foreign Bodies seen or palpated  Anesthesia: Digital block   Local anesthetic: lidocaine 1 % without epinephrine  Anesthetic total: 5 ml  Irrigation method: syringe Amount of cleaning: Extensive   Skin closure: 4-0 Ethilon sutures   Number of sutures: 10   Technique: Flap is reattached using 10 simple interrupted sutures. This approximates edges well. Cap refill intact status post procedure. The patient reports sensation intact status post procedure.   Patient tolerance: Patient tolerated the procedure well with no immediate complications.    LACERATION REPAIR Performed by: Darletta Moll Authorized by: Charline Bills Vee Bahe Consent: Verbal consent obtained. Risks and benefits: risks, benefits and alternatives were discussed Consent given by: patient Patient identity confirmed: provided demographic data Prepped and Draped in normal sterile fashion Wound explored  Laceration Location: Dorsal aspect fifth digit right hand  Laceration Length: 3 cm  No Foreign Bodies seen or palpated  Anesthesia: Digital block performed at same time as above procedure   Local anesthetic: lidocaine 1 % without epinephrine  Anesthetic total: 5 ml  Irrigation method: syringe Amount of cleaning: standard  Skin closure: 4-0 Ethilon sutures   Number of sutures: 5    Technique: Simple interrupted. Edges approximated and closed using 5 simple interrupted sutures. Cap refill intact status post procedure. Patient reports sensation intact status post procedure.   Patient tolerance: Patient tolerated the procedure well with no immediate complications.   Charline Bills Abcde Oneil, PA-C 02/06/16 2243

## 2016-02-06 NOTE — ED Provider Notes (Signed)
[facility] Emergency Department Provider Note  ____________________________________________  Time seen: Approximately 6:48 PM  I have reviewed the triage vital signs and the nursing notes.   HISTORY  Chief Complaint Laceration    HPI Ivan Hamilton is a 50 y.o. male a history of chronic alcohol abuse, also per his family potential posttraumatic stress disorder from his time in the [employer] in [place].  The patient reports that he cut his hand. He tells me he was "playing around" but is extremely vague. The patient's daughter reports that he was outside, has been acting very paranoid, and frequently talking about his friends who are in the [employer] with him in [place]. He gets very paranoid, and today she says that he took glass and attempted to cut his wrist and his life, however he cut his right pinky finger instead. He has a large cut, and they called the police because of his agitation, paranoid behavior, and believe that he had a suicide attempt by cutting himself today.  He has been drinking alcohol excessively, one bottle of tequila daily for several years. He stridor rehabilitation twice but goes back to drinking immediately.   The family believes he needs to be committed for both alcohol abuse and also underlying psychiatric disease.  Past Medical History  Diagnosis Date  . Alcohol withdrawal ([place])   . Hypothyroidism   . Chronic pain   . Varicose veins   . B12 deficiency     Patient Active Problem List   Diagnosis Date Noted  . Alcohol withdrawal delirium ([place])   . Alcohol withdrawal ([place]) 09/02/2015  . Alcohol use disorder, severe, dependence ([place]) 09/02/2015  . 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[place]) 09/02/2015  . PTSD (post-traumatic stress disorder) 09/01/2015  . Hypothyroid 09/01/2015  . Gastric reflux 09/01/2015  . DDD (degenerative disc disease),  lumbosacral 09/01/2015    Past Surgical History  Procedure Laterality Date  . Leg surgery     The daughter reports he has prescription medications, but he does not take them Current Outpatient Rx  Name  Route  Sig  Dispense  Refill  . FLUoxetine (PROZAC) 20 MG capsule   Oral   Take 1 capsule (20 mg total) by mouth daily.   30 capsule   0   . gabapentin (NEURONTIN) 400 MG capsule   Oral   Take 1 capsule (400 mg total) by mouth 3 (three) times daily.   90 capsule   0   . levothyroxine (SYNTHROID, LEVOTHROID) 100 MCG tablet   Oral   Take 100 mcg by mouth daily before breakfast.         . LORazepam (ATIVAN) 1 MG tablet   Oral   Take 1 tablet (1 mg total) by mouth 2 (two) times daily as needed for anxiety.   30 tablet   0   . naltrexone (DEPADE) 50 MG tablet   Oral   Take 1 tablet (50 mg total) by mouth daily.   30 tablet   0   . predniSONE (DELTASONE) 10 MG tablet   Oral   Take 10 mg by mouth See admin instructions. Taper 3,3,3,2,2,2,1,1,1         . risperiDONE (RISPERDAL) 0.5 MG tablet   Oral   Take 0.5 mg by mouth at bedtime.         . cephALEXin (KEFLEX) 500 MG capsule   Oral   Take 1 capsule (500 mg total) by mouth  4 (four) times daily.   40 capsule   0     Allergies Review of patient's allergies indicates no known allergies.  History reviewed. No pertinent family history.  Social History Social History  Substance Use Topics  . Smoking status: Never Smoker   . Smokeless tobacco: None  . Alcohol Use: Yes     Comment: daily- 1 pint of vodka    Review of Systems EM caveat: The patient reports that he is "fine" and he doesn't have any problems, but clearly he has a large laceration with bleeding controlled to the right pinky. He acknowledges this only after showing it to him. He then tells me that he was "playing around"  The patient denies any pain, nausea, vomiting, injury to head, falls. Daughter denies that he fell or injured himself, but  rather cut himself with a glass across the hand and his suicide attempt. He has been acting normally for him while he is intoxicated, this appears to be his normal behavior per the family whenever he is drinking heavily which she has been for a while. ____________________________________________   PHYSICAL EXAM:  VITAL SIGNS: ED Triage Vitals  Enc Vitals Group     BP 02/06/16 1814 132/99 mmHg     Pulse Rate 02/06/16 1814 122     Resp 02/06/16 1814 18     Temp 02/06/16 1814 98.3 F (36.8 C)     Temp Source 02/06/16 1814 Oral     SpO2 02/06/16 1814 96 %     Weight 02/06/16 1814 144 lb (65.318 kg)     Height 02/06/16 1814 5\' 4"  (1.626 m)     Head Cir --      Peak Flow --      Pain Score 02/06/16 1815 4     Pain Loc --      Pain Edu? --      Excl. in [place]? --    Constitutional: Alert and oriented. Slightly slurred speech, slightly overall intoxicated appearance Eyes: Conjunctivae are slightly injected. PERRL. EOMI. Head: Atraumatic. Nose: No congestion/rhinnorhea. Mouth/Throat: Mucous membranes are moist.  Oropharynx non-erythematous. Neck: No stridor.   Cardiovascular: Normal rate, regular rhythm. Grossly normal heart sounds.  Good peripheral circulation. Respiratory: Normal respiratory effort.  No retractions. Lungs CTAB. Gastrointestinal: Soft and nontender. No distention.  Musculoskeletal: No lower extremity tenderness nor edema.    Right hand Median, ulnar, radial motor intact. Cap refill less than 2 seconds all digits. Strong radial pulse. 5 out of 5 strength throughout the hand intrinsics, flexion and extension at the wrist. The right fifth digit has a laceration that is lunate and about 2 cm in length traversing the pad of the distal left fifth digit. Does not appear to have an ischemic appearance at the tip, however it is rather deep and slightly jagged in appearance.   Right forearm Abrasion noted, no evidence of deformity  Left hand Median, ulnar, radial motor intact.  Cap refill less than 2 seconds all digits. Strong radial pulse. 5 out of 5 strength throughout the hand intrinsics, flexion and extension at the wrist. No evidence of trauma. ____________________________________________   Neurologic:  Slurred speech and language. No gross focal neurologic deficits are appreciated, normal cranial nerves, moves all extremities with normal strength. Some slight ataxia in the upper extremities is noted. No tremors. No diaphoresis. No dilated pupils. Skin:  Skin is warm, dry and intact. No rash noted. Psychiatric: Mood and affect are somewhat flat, gets slightly agitated when asked questions  about how this occurred.   ____________________________________________   LABS (all labs ordered are listed, but only abnormal results are displayed)  Labs Reviewed  CBC - Abnormal; Notable for the following:    RDW 15.4 (*)    All other components within normal limits  COMPREHENSIVE METABOLIC PANEL - Abnormal; Notable for the following:    Glucose, Bld 112 (*)    Total Protein 9.0 (*)    Albumin 5.5 (*)    AST 49 (*)    All other components within normal limits  ETHANOL - Abnormal; Notable for the following:    Alcohol, Ethyl (B) 413 (*)    All other components within normal limits   ____________________________________________  EKG   ____________________________________________  RADIOLOGY     DG Finger Little Right (Final result) Result time: 02/06/16 19:18:03   Final result by Rad Results In Interface (02/06/16 19:18:03)   Narrative:   CLINICAL DATA: Laceration right little finger after the patient fell onto a glass table today. Initial encounter.  EXAM: RIGHT LITTLE FINGER 2+V  COMPARISON: None.  FINDINGS: Skin defect is seen on the volar aspect of the right little finger distally. No underlying fracture, dislocation or radiopaque foreign body.  IMPRESSION: Laceration right little finger. Otherwise negative.   Electronically  Signed By: Inge Rise M.D. On: 02/06/2016 19:18       ____________________________________________   PROCEDURES  Procedure(s) performed: See laceration note performed by physician assistant  Critical Care performed: No  ____________________________________________   INITIAL IMPRESSION / ASSESSMENT AND PLAN / ED COURSE  Pertinent labs & imaging results that were available during my care of the patient were reviewed by me and considered in my medical decision making (see chart for details).  Patient presents for evaluation of a laceration to the right hand, also reported alcohol intoxication and possible suicidal ideation. Please send her involuntary commitment based on the reports from his family that this was a suicide or self injury attempt.    ----------------------------------------- 7:23 PM on 02/06/2016 -----------------------------------------  Patient pacing about, becoming agitated standing at the doorway having to be redirected by the police. I have written for Haldol by mouth as he is not actively violent, and hopefully he will take this helped calm him some.  ----------------------------------------- 12:09 AM on 02/07/2016 -----------------------------------------  Patient resting. Await psychiatric consultation. Laceration repaired. Ongoing care assigned to Dr. Lovena Le with the plan to have psychiatry consult on the patient. Patient was placed on alcohol withdrawal protocol. Remains calm, no events. ____________________________________________   FINAL CLINICAL IMPRESSION(S) / ED DIAGNOSES  Final diagnoses:  Laceration      Delman Kitten, MD 02/07/16 0009

## 2016-02-06 NOTE — ED Notes (Addendum)
Pt arrives with laceration to right arm and right pinky finger, states he has consumed ETOH today, denies SI

## 2016-02-06 NOTE — ED Notes (Signed)
Pt brought to ED by [employer].  Pt intoxicated, reports 3 bottles of tequila consumed.  Pts family sts that was saying he wanted to kill himself, which is why [employer] was at house.  Pt had witnessed fall resulting in lacerations to R hand and forearm.  No LOC observed by [employer] witness.  Pt alert, able to move all extremities, no n/v.

## 2016-02-07 DIAGNOSIS — F10229 Alcohol dependence with intoxication, unspecified: Secondary | ICD-10-CM | POA: Diagnosis not present

## 2016-02-07 DIAGNOSIS — F1024 Alcohol dependence with alcohol-induced mood disorder: Secondary | ICD-10-CM

## 2016-02-07 DIAGNOSIS — F3289 Other specified depressive episodes: Secondary | ICD-10-CM

## 2016-02-07 LAB — ETHANOL: Alcohol, Ethyl (B): 212 mg/dL — ABNORMAL HIGH (ref <5)

## 2016-02-07 MED ORDER — LORAZEPAM 1 MG PO TABS: 1.0000 mg | ORAL_TABLET | Freq: Two times a day (BID) | ORAL | Status: DC | PRN

## 2016-02-07 MED ORDER — RISPERIDONE 0.5 MG PO TABS
0.5000 mg | ORAL_TABLET | Freq: Every day | ORAL | Status: DC
  Administered 2016-02-07: 0.5 mg via ORAL
  Filled 2016-02-07: qty 1

## 2016-02-07 MED ORDER — ALUM & MAG HYDROXIDE-SIMETH 200-200-20 MG/5ML PO SUSP
30.0000 mL | Freq: Once | ORAL | Status: AC
  Administered 2016-02-07: 30 mL via ORAL

## 2016-02-07 MED ORDER — GABAPENTIN 400 MG PO CAPS
400.0000 mg | ORAL_CAPSULE | Freq: Three times a day (TID) | ORAL | Status: DC
  Administered 2016-02-07 – 2016-02-08 (×5): 400 mg via ORAL
  Filled 2016-02-07 (×6): qty 1

## 2016-02-07 MED ORDER — IBUPROFEN 600 MG PO TABS
600.0000 mg | ORAL_TABLET | Freq: Four times a day (QID) | ORAL | Status: DC | PRN
  Administered 2016-02-07 (×3): 600 mg via ORAL
  Filled 2016-02-07 (×3): qty 1

## 2016-02-07 MED ORDER — ALUM & MAG HYDROXIDE-SIMETH 200-200-20 MG/5ML PO SUSP
ORAL | Status: AC
  Administered 2016-02-07: 30 mL via ORAL
  Filled 2016-02-07: qty 30

## 2016-02-07 MED ORDER — LEVOTHYROXINE SODIUM 50 MCG PO TABS
100.0000 ug | ORAL_TABLET | Freq: Every day | ORAL | Status: DC
  Administered 2016-02-07 – 2016-02-08 (×2): 100 ug via ORAL
  Filled 2016-02-07 (×2): qty 2

## 2016-02-07 MED ORDER — IBUPROFEN 800 MG PO TABS: 800.0000 mg | ORAL_TABLET | Freq: Once | ORAL | Status: DC

## 2016-02-07 NOTE — ED Notes (Signed)
Pt is alert and orient pleasant and cooperative denies any pain denies si and hi. Pt is under IVC family took out because he threating to cut wrist and excessive ETOH. Pt fell yesterday had laceration 4th and 5th digit PA sutured and applied splint and ace wrap because Pt kept messing with sutures. PA wanted sutures to stay in place.

## 2016-02-07 NOTE — ED Notes (Signed)
Pt sitting up eating breakfast at present.Marland Kitchen

## 2016-02-07 NOTE — ED Provider Notes (Signed)
  Physical Exam  BP 118/94 mmHg  Pulse 85  Temp(Src) 98 F (36.7 C) (Oral)  Resp 18  Ht 5\' 4"  (1.626 m)  Wt 144 lb (65.318 kg)  BMI 24.71 kg/m2  SpO2 98%  Physical Exam  ED Course  Procedures  MDM Dr. Weber Cooks saw patient. Concerned about the laceration and alcohol intoxication. Has no beds downstairs. Psych wants to maintain IVC and will reassess.   Drenda Freeze, MD 02/07/16 1320

## 2016-02-07 NOTE — Consult Note (Signed)
[facility] Face-to-Face Psychiatry Consult   Reason for Consult:  Consult for this 50 year old man with a history of alcohol abuse brought in under commitment after lacerating himself. Referring Physician:  Darl Householder Patient Identification: Ivan Hamilton MRN:  [id number] Principal Diagnosis: Alcohol-induced depressive disorder with moderate or severe use disorder with onset during intoxication [facility]) Diagnosis:   Patient Active Problem List   Diagnosis Date Noted  . Alcohol withdrawal delirium ([place]) [F10.231]   . Alcohol withdrawal ([place]) [F10.239] 09/02/2015  . Alcohol use disorder, severe, dependence ([place]) [F10.20] 09/02/2015  . 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[place]) [F10.24, F10.229, F32.89] 09/02/2015  . PTSD (post-traumatic stress disorder) [F43.10] 09/01/2015  . Hypothyroid [E03.9] 09/01/2015  . Gastric reflux [K21.9] 09/01/2015  . DDD (degenerative disc disease), lumbosacral [M51.37] 09/01/2015    Total Time spent with patient: 1 hour  Subjective:   Ivan Hamilton is a 50 y.o. male patient admitted with "I want to go home".  HPI:  Patient interviewed. Chart reviewed. Patient known from previous encounters. 50 year old man with alcohol abuse problem presents extremely intoxicated blood alcohol level over 400. He had a laceration to his hand that required medical intervention. It was reported by family that this was a self-inflicted laceration while intoxicated. Patient admits that he has no memory of what happened to his hand. We talked about how the plan had been last week that he was supposedly going to go to a rehabilitation facility at the end of the week. He admits that he did not follow through on that. He has continued drinking heavily ever since we saw him last. Denies other drug abuse. Patient insists that he has no wish to harm himself and wants to go home. Does not want to come into the hospital for detox or any  kind of treatment.  Medical history: History of hypothyroidism. New-onset injury to his hands self-inflicted.  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 Has lived in the [place] for decades. Lives with his wife and adult children. Not currently working because of his alcohol abuse.  Substance abuse history: Long-standing alcohol abuse problems going back decades but which appear to of been escalating over the last couple years. Has presented multiple times intoxicated. At times has had delirium tremens with full-blown hallucinations. I don't think he's had a history of a seizure. Patient has resisted appropriate substance abuse treatment over and over.  Past Psychiatric History: History of posttraumatic stress disorder related to his experiences in the [place] years ago. Symptoms have been hard to tease out from his alcohol abuse recently because of his heavy drinking. No history of suicide attempts  Risk to Self:   Risk to Others:   Prior Inpatient Therapy:   Prior Outpatient Therapy:    Past Medical History:  Past Medical History  Diagnosis Date  . Alcohol withdrawal ([place])   . Hypothyroidism   . Chronic pain   . Varicose veins   . B12 deficiency     Past Surgical History  Procedure Laterality Date  . Leg surgery     Family History: History reviewed. No pertinent family history. Family Psychiatric  History: Family history of alcohol abuse Social History:  History  Alcohol Use  . Yes    Comment: daily- 1 pint of vodka     History  Drug Use No    Social History   Social History  . Marital Status: Married    Spouse Name: N/A  .  Number of Children: N/A  . Years of Education: N/A   Social History Main Topics  . Smoking status: Never Smoker   . Smokeless tobacco: None  . Alcohol Use: Yes     Comment: daily- 1 pint of vodka  . Drug Use: No  . Sexual Activity: Not Asked   Other Topics Concern  . None   Social History Narrative    Additional Social History:    Allergies:  No Known Allergies  Labs:  Results for orders placed or performed during the hospital encounter of 02/06/16 (from the past 48 hour(s))  CBC     Status: Abnormal   Collection Time: 02/06/16  7:57 PM  Result Value Ref Range   WBC 6.8 3.8 - 10.6 K/uL   RBC 4.87 4.40 - 5.90 MIL/uL   Hemoglobin 15.1 13.0 - 18.0 g/dL   HCT 45.4 40.0 - 52.0 %   MCV 93.2 80.0 - 100.0 fL   MCH 31.0 26.0 - 34.0 pg   MCHC 33.3 32.0 - 36.0 g/dL   RDW 15.4 (H) 11.5 - 14.5 %   Platelets 156 150 - 440 K/uL  Comprehensive metabolic panel     Status: Abnormal   Collection Time: 02/06/16  7:57 PM  Result Value Ref Range   Sodium 140 135 - 145 mmol/L   Potassium 3.5 3.5 - 5.1 mmol/L   Chloride 103 101 - 111 mmol/L   CO2 23 22 - 32 mmol/L   Glucose, Bld 112 (H) 65 - 99 mg/dL   BUN 8 6 - 20 mg/dL   Creatinine, Ser 0.72 0.61 - 1.24 mg/dL   Calcium 9.5 8.9 - 10.3 mg/dL   Total Protein 9.0 (H) 6.5 - 8.1 g/dL   Albumin 5.5 (H) 3.5 - 5.0 g/dL   AST 49 (H) 15 - 41 U/L   ALT 36 17 - 63 U/L   Alkaline Phosphatase 52 38 - 126 U/L   Total Bilirubin 0.9 0.3 - 1.2 mg/dL   GFR calc non Af Amer >60 >60 mL/min   GFR calc Af Amer >60 >60 mL/min    Comment: (NOTE) The eGFR has been calculated using the CKD EPI equation. This calculation has not been validated in all clinical situations. eGFR's persistently <60 mL/min signify possible Chronic Kidney Disease.    Anion gap 14 5 - 15  Ethanol     Status: Abnormal   Collection Time: 02/06/16  7:57 PM  Result Value Ref Range   Alcohol, Ethyl (B) 413 (HH) <5 mg/dL    Comment: CRITICAL RESULT CALLED TO, READ BACK BY AND VERIFIED WITH NELLIE [facility] RN AT 2040 02/06/16 MSS.        LOWEST DETECTABLE LIMIT FOR SERUM ALCOHOL IS 5 mg/dL FOR MEDICAL PURPOSES ONLY   Ethanol     Status: Abnormal   Collection Time: 02/07/16  5:52 AM  Result Value Ref Range   Alcohol, Ethyl (B) 212 (H) <5 mg/dL    Comment:        LOWEST DETECTABLE  LIMIT FOR SERUM ALCOHOL IS 5 mg/dL FOR MEDICAL PURPOSES ONLY     Current Facility-Administered Medications  Medication Dose Route Frequency Provider Last Rate Last Dose  . cephALEXin (KEFLEX) capsule 500 mg  500 mg Oral QID Delman Kitten, MD   500 mg at 02/07/16 1458  . folic acid (FOLVITE) tablet 1 mg  1 mg Oral Daily Delman Kitten, MD   1 mg at 02/07/16 1023  . gabapentin (NEURONTIN) capsule 400 mg  400 mg  Oral TID Ruby Cola, MD   400 mg at 02/07/16 1502  . ibuprofen (ADVIL,MOTRIN) tablet 600 mg  600 mg Oral Q6H PRN Ruby Cola, MD   600 mg at 02/07/16 1242  . levothyroxine (SYNTHROID, LEVOTHROID) tablet 100 mcg  100 mcg Oral QAC breakfast Ruby Cola, MD   100 mcg at 02/07/16 0808  . lidocaine-EPINEPHrine (XYLOCAINE-EPINEPHrine) 1 %-1:200000 (PF) injection 10 mL  10 mL Infiltration Once Charline Bills Cuthriell, PA-C   10 mL at 02/06/16 2100  . LORazepam (ATIVAN) tablet 1 mg  1 mg Oral Q6H PRN Delman Kitten, MD   1 mg at 02/07/16 8413   Or  . LORazepam (ATIVAN) injection 1 mg  1 mg Intravenous Q6H PRN Delman Kitten, MD      . LORazepam (ATIVAN) tablet 1 mg  1 mg Oral BID PRN Ruby Cola, MD      . multivitamin with minerals tablet 1 tablet  1 tablet Oral Daily Delman Kitten, MD   1 tablet at 02/07/16 1023  . risperiDONE (RISPERDAL) tablet 0.5 mg  0.5 mg Oral QHS Ruby Cola, MD      . thiamine (VITAMIN B-1) tablet 100 mg  100 mg Oral Daily Delman Kitten, MD   100 mg at 02/07/16 1023   Or  . thiamine (B-1) injection 100 mg  100 mg Intravenous Daily Delman Kitten, MD       Current Outpatient Prescriptions  Medication Sig Dispense Refill  . FLUoxetine (PROZAC) 20 MG capsule Take 1 capsule (20 mg total) by mouth daily. 30 capsule 0  . gabapentin (NEURONTIN) 400 MG capsule Take 1 capsule (400 mg total) by mouth 3 (three) times daily. 90 capsule 0  . levothyroxine (SYNTHROID, LEVOTHROID) 100 MCG tablet Take 100 mcg by mouth daily before breakfast.    . LORazepam (ATIVAN) 1 MG tablet Take 1 tablet  (1 mg total) by mouth 2 (two) times daily as needed for anxiety. 30 tablet 0  . naltrexone (DEPADE) 50 MG tablet Take 1 tablet (50 mg total) by mouth daily. 30 tablet 0  . predniSONE (DELTASONE) 10 MG tablet Take 10 mg by mouth See admin instructions. Taper 3,3,3,2,2,2,1,1,1    . risperiDONE (RISPERDAL) 0.5 MG tablet Take 0.5 mg by mouth at bedtime.    . cephALEXin (KEFLEX) 500 MG capsule Take 1 capsule (500 mg total) by mouth 4 (four) times daily. 40 capsule 0    Musculoskeletal: Strength & Muscle Tone: decreased Gait & Station: unsteady Patient leans: N/A  Psychiatric Specialty Exam: Physical Exam  Nursing note and vitals reviewed. Constitutional: He appears well-developed and well-nourished.  HENT:  Head: Normocephalic and atraumatic.  Eyes: Conjunctivae are normal. Pupils are equal, round, and reactive to light.  Neck: Normal range of motion.  Cardiovascular: Regular rhythm and normal heart sounds.   Respiratory: Effort normal. No respiratory distress.  GI: Soft.  Musculoskeletal: Normal range of motion.  Neurological: He is alert.  Skin: Skin is warm and dry.     Psychiatric: His affect is blunt. His speech is delayed. He is slowed. He expresses impulsivity. He expresses suicidal ideation. He exhibits abnormal recent memory.    Review of Systems  Constitutional: Negative.   HENT: Negative.   Eyes: Negative.   Respiratory: Negative.   Cardiovascular: Negative.   Gastrointestinal: Negative.   Musculoskeletal: Negative.   Skin: Negative.   Neurological: Negative.   Psychiatric/Behavioral: Positive for memory loss and substance abuse. Negative for depression, suicidal ideas and hallucinations. The patient  is nervous/anxious. The patient does not have insomnia.     Blood pressure 118/94, pulse 85, temperature 98 F (36.7 C), temperature source Oral, resp. rate 18, height _0  (1.626 m), weight 65.318 kg (144 lb), SpO2 98 %.Body mass index is 24.71 kg/(m^2).  General  Appearance: Disheveled  Eye Contact:  Minimal  Speech:  Garbled  Volume:  Decreased  Mood:  Dysphoric  Affect:  Constricted and Flat  Thought Process:  Disorganized  Orientation:  Full (Time, Place, and Person)  Thought Content:  Simplistic and overly focused on wanting to go home. Typical of intoxication  Suicidal Thoughts:  No  Homicidal Thoughts:  No  Memory:  Immediate;   Fair Recent;   Poor Remote;   Fair  Judgement:  Impaired  Insight:  Lacking  Psychomotor Activity:  Decreased  Concentration:  Concentration: Poor  Recall:  Poor  Fund of Knowledge:  Poor  Language:  Poor  Akathisia:  No  Handed:  Right  AIMS (if indicated):     Assets:  Catering manager Housing Physical Health Social Support  ADL's:  Impaired  Cognition:  Impaired,  Moderate  Sleep:        Treatment Plan Summary: Daily contact with patient to assess and evaluate symptoms and progress in treatment, Medication management and Plan 50 year old man with alcohol abuse who presents once again extremely intoxicated just a few days after his last visit to the emergency room. This time under commitment. Patient has lacerated himself while intoxicated. He has no memory of it. He insists that he has no thoughts of wanting to hurt himself. He does speculate that the fact that he was given "nerve pills" by his outpatient doctor and was drinking along with them may have contributed to this behavior. I think that's probably exactly right. It sounds like he's been seeing somebody in the community who has been giving him benzodiazepines and he has still been drinking on top of them. Patient's drinking and behavior are getting more out of control. I am not comfortable with discharging him certainly not while he is still acutely intoxicated. No changed commitment. Detox protocol to continue. Monitor behavior in the hospital and continually reassess.  Disposition: Supportive therapy provided about ongoing  stressors. Discussed crisis plan, support from social network, calling 911, coming to the Emergency Department, and calling Suicide Hotline.  Alethia Berthold, MD 02/07/2016 3:51 PM

## 2016-02-07 NOTE — ED Notes (Signed)
Pts laceration to R 4th and 5th finger sutured and wrapped by Johnathan PA.

## 2016-02-08 MED ORDER — LORAZEPAM 1 MG PO TABS
1.0000 mg | ORAL_TABLET | Freq: Once | ORAL | Status: AC
  Administered 2016-02-08: 1 mg via ORAL

## 2016-02-08 MED ORDER — ACETAMINOPHEN 325 MG PO TABS
650.0000 mg | ORAL_TABLET | Freq: Once | ORAL | Status: AC
  Administered 2016-02-08: 650 mg via ORAL

## 2016-02-08 MED ORDER — ACETAMINOPHEN 325 MG PO TABS
ORAL_TABLET | ORAL | Status: AC
  Administered 2016-02-08: 650 mg via ORAL
  Filled 2016-02-08: qty 2

## 2016-02-08 MED ORDER — IBUPROFEN 800 MG PO TABS
ORAL_TABLET | ORAL | Status: AC
  Filled 2016-02-08: qty 1

## 2016-02-08 NOTE — BH Assessment (Signed)
Writer spoke with patient about following up with [facility] and provided him with contact information for Peer Support Lanae Boast [phone]), to help bridge the gap, until he receives Outpatient Mental Health/Substance Abuse Treatment.  With the permission of the patient, Probation officer forwarded his contact information to Peer Support, in order that he may contact him.   Per the request of the patient and his wife, Probation officer provided the patient with treatment facilities; [facility] [place], [place] [address] [phone]  [facility]) [place],  [place], [place] [address] [phone]  [facility] [address]  [place], [place] [address] [phone]

## 2016-02-08 NOTE — ED Provider Notes (Signed)
-----------------------------------------   8:44 AM on 02/08/2016 -----------------------------------------   Blood pressure 129/99, pulse 89, temperature 97.8 F (36.6 C), temperature source Oral, resp. rate 18, height 5\' 4"  (1.626 m), weight 144 lb (65.318 kg), SpO2 99 %.  The patient had no acute events since last update.  Calm and cooperative at this time.  Disposition is pending per Psychiatry/Behavioral Medicine team recommendations.     Nena Polio, MD 02/08/16 [phone]

## 2016-02-08 NOTE — ED Notes (Addendum)
Education officer, environmental from [place] spoke w/ pt.  [place] went into room at this time.

## 2016-02-08 NOTE — ED Notes (Signed)
Pt has slight tremble to bilateral hands. Pt requesting medication to help with tremble. MD informed. MD ordered ativan

## 2016-02-08 NOTE — BH Assessment (Signed)
Received phone call from patient's [facility] Coordinator [phone]) asking about the patient and his disposition. Writer wasn't familiar with this Coordinator, therefore he told him would have to call him back, after verifying who he was.  After checking with [place], writer called Care Coordinator back left a HIPPA Compliant message requesting a return phone call. Writer then spoke with Care Coordinator ([phone]) and informed her the patient was pending Psych Consult.

## 2016-02-08 NOTE — Consult Note (Signed)
[facility] Face-to-Face Psychiatry Consult   Reason for Consult:  Consult for this 50 year old man with a history of alcohol abuse brought in under commitment after lacerating himself. Referring Physician:  Darl Householder Patient Identification: Ivan Hamilton MRN:  [id number] Principal Diagnosis: Alcohol-induced depressive disorder with moderate or severe use disorder with onset during intoxication [facility]) Diagnosis:   Patient Active Problem List   Diagnosis Date Noted  . Alcohol withdrawal delirium ([place]) [F10.231]   . Alcohol withdrawal ([place]) [F10.239] 09/02/2015  . Alcohol use disorder, severe, dependence ([place]) [F10.20] 09/02/2015  . 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[place]) [F10.24, F10.229, F32.89] 09/02/2015  . PTSD (post-traumatic stress disorder) [F43.10] 09/01/2015  . Hypothyroid [E03.9] 09/01/2015  . Gastric reflux [K21.9] 09/01/2015  . DDD (degenerative disc disease), lumbosacral [M51.37] 09/01/2015    Total Time spent with patient: 20 minutes  Subjective:   Mizraim Harmening is a 50 y.o. male patient admitted with "I want to go home".  Follow-up Wednesday the 32th 50 year old man with alcohol abuse. Patient reevaluated today. Case reviewed with TTS, care management from [place] and emergency room doctor. Patient has not had a seizure. He has been able to eat without difficulty. On evaluation today the patient is awake and alert and oriented with no sign of delirium. Denies visual hallucinations. Tremor is much improved. Able to sit up and stand up without being unstable. Denies any suicidal thoughts. Patient is again requesting to be discharged.  HPI:  Patient interviewed. Chart reviewed. Patient known from previous encounters. 50 year old man with alcohol abuse problem presents extremely intoxicated blood alcohol level over 400. He had a laceration to his hand that required medical intervention. It was reported by  family that this was a self-inflicted laceration while intoxicated. Patient admits that he has no memory of what happened to his hand. We talked about how the plan had been last week that he was supposedly going to go to a rehabilitation facility at the end of the week. He admits that he did not follow through on that. He has continued drinking heavily ever since we saw him last. Denies other drug abuse. Patient insists that he has no wish to harm himself and wants to go home. Does not want to come into the hospital for detox or any kind of treatment.  Medical history: History of hypothyroidism. New-onset injury to his hands self-inflicted.  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 Has lived in the [place] for decades. Lives with his wife and adult children. Not currently working because of his alcohol abuse.  Substance abuse history: Long-standing alcohol abuse problems going back decades but which appear to of been escalating over the last couple years. Has presented multiple times intoxicated. At times has had delirium tremens with full-blown hallucinations. I don't think he's had a history of a seizure. Patient has resisted appropriate substance abuse treatment over and over.  Past Psychiatric History: History of posttraumatic stress disorder related to his experiences in the [place] years ago. Symptoms have been hard to tease out from his alcohol abuse recently because of his heavy drinking. No history of suicide attempts  Risk to Self:   Risk to Others:   Prior Inpatient Therapy:   Prior Outpatient Therapy:    Past Medical History:  Past Medical History  Diagnosis Date  . Alcohol withdrawal ([place])   . Hypothyroidism   . Chronic pain   . Varicose veins   . B12 deficiency  Past Surgical History  Procedure Laterality Date  . Leg surgery     Family History: History reviewed. No pertinent family history. Family Psychiatric  History: Family history of  alcohol abuse Social History:  History  Alcohol Use  . Yes    Comment: daily- 1 pint of vodka     History  Drug Use No    Social History   Social History  . Marital Status: Married    Spouse Name: N/A  . Number of Children: N/A  . Years of Education: N/A   Social History Main Topics  . Smoking status: Never Smoker   . Smokeless tobacco: None  . Alcohol Use: Yes     Comment: daily- 1 pint of vodka  . Drug Use: No  . Sexual Activity: Not Asked   Other Topics Concern  . None   Social History Narrative   Additional Social History:    Allergies:  No Known Allergies  Labs:  Results for orders placed or performed during the hospital encounter of 02/06/16 (from the past 48 hour(s))  CBC     Status: Abnormal   Collection Time: 02/06/16  7:57 PM  Result Value Ref Range   WBC 6.8 3.8 - 10.6 K/uL   RBC 4.87 4.40 - 5.90 MIL/uL   Hemoglobin 15.1 13.0 - 18.0 g/dL   HCT 45.4 40.0 - 52.0 %   MCV 93.2 80.0 - 100.0 fL   MCH 31.0 26.0 - 34.0 pg   MCHC 33.3 32.0 - 36.0 g/dL   RDW 15.4 (H) 11.5 - 14.5 %   Platelets 156 150 - 440 K/uL  Comprehensive metabolic panel     Status: Abnormal   Collection Time: 02/06/16  7:57 PM  Result Value Ref Range   Sodium 140 135 - 145 mmol/L   Potassium 3.5 3.5 - 5.1 mmol/L   Chloride 103 101 - 111 mmol/L   CO2 23 22 - 32 mmol/L   Glucose, Bld 112 (H) 65 - 99 mg/dL   BUN 8 6 - 20 mg/dL   Creatinine, Ser 0.72 0.61 - 1.24 mg/dL   Calcium 9.5 8.9 - 10.3 mg/dL   Total Protein 9.0 (H) 6.5 - 8.1 g/dL   Albumin 5.5 (H) 3.5 - 5.0 g/dL   AST 49 (H) 15 - 41 U/L   ALT 36 17 - 63 U/L   Alkaline Phosphatase 52 38 - 126 U/L   Total Bilirubin 0.9 0.3 - 1.2 mg/dL   GFR calc non Af Amer >60 >60 mL/min   GFR calc Af Amer >60 >60 mL/min    Comment: (NOTE) The eGFR has been calculated using the CKD EPI equation. This calculation has not been validated in all clinical situations. eGFR's persistently <60 mL/min signify possible Chronic Kidney Disease.      Anion gap 14 5 - 15  Ethanol     Status: Abnormal   Collection Time: 02/06/16  7:57 PM  Result Value Ref Range   Alcohol, Ethyl (B) 413 (HH) <5 mg/dL    Comment: CRITICAL RESULT CALLED TO, READ BACK BY AND VERIFIED WITH NELLIE [facility] RN AT 2040 02/06/16 MSS.        LOWEST DETECTABLE LIMIT FOR SERUM ALCOHOL IS 5 mg/dL FOR MEDICAL PURPOSES ONLY   Ethanol     Status: Abnormal   Collection Time: 02/07/16  5:52 AM  Result Value Ref Range   Alcohol, Ethyl (B) 212 (H) <5 mg/dL    Comment:        LOWEST DETECTABLE  LIMIT FOR SERUM ALCOHOL IS 5 mg/dL FOR MEDICAL PURPOSES ONLY     Current Facility-Administered Medications  Medication Dose Route Frequency Provider Last Rate Last Dose  . cephALEXin (KEFLEX) capsule 500 mg  500 mg Oral QID Delman Kitten, MD   500 mg at 02/08/16 1454  . folic acid (FOLVITE) tablet 1 mg  1 mg Oral Daily Delman Kitten, MD   1 mg at 02/08/16 0927  . gabapentin (NEURONTIN) capsule 400 mg  400 mg Oral TID Ruby Cola, MD   400 mg at 02/08/16 1454  . ibuprofen (ADVIL,MOTRIN) tablet 600 mg  600 mg Oral Q6H PRN Ruby Cola, MD   600 mg at 02/07/16 2005  . ibuprofen (ADVIL,MOTRIN) tablet 800 mg  800 mg Oral Once Gregor Hams, MD      . levothyroxine (SYNTHROID, LEVOTHROID) tablet 100 mcg  100 mcg Oral QAC breakfast Ruby Cola, MD   100 mcg at 02/08/16 0927  . lidocaine-EPINEPHrine (XYLOCAINE-EPINEPHrine) 1 %-1:200000 (PF) injection 10 mL  10 mL Infiltration Once Charline Bills Cuthriell, PA-C   10 mL at 02/06/16 2100  . LORazepam (ATIVAN) tablet 1 mg  1 mg Oral Q6H PRN Delman Kitten, MD   1 mg at 02/08/16 0931   Or  . LORazepam (ATIVAN) injection 1 mg  1 mg Intravenous Q6H PRN Delman Kitten, MD      . LORazepam (ATIVAN) tablet 1 mg  1 mg Oral BID PRN Ruby Cola, MD      . multivitamin with minerals tablet 1 tablet  1 tablet Oral Daily Delman Kitten, MD   1 tablet at 02/08/16 0927  . risperiDONE (RISPERDAL) tablet 0.5 mg  0.5 mg Oral QHS Ruby Cola, MD   0.5 mg at  02/07/16 2151  . thiamine (VITAMIN B-1) tablet 100 mg  100 mg Oral Daily Delman Kitten, MD   100 mg at 02/08/16 1610   Or  . thiamine (B-1) injection 100 mg  100 mg Intravenous Daily Delman Kitten, MD       Current Outpatient Prescriptions  Medication Sig Dispense Refill  . FLUoxetine (PROZAC) 20 MG capsule Take 1 capsule (20 mg total) by mouth daily. 30 capsule 0  . gabapentin (NEURONTIN) 400 MG capsule Take 1 capsule (400 mg total) by mouth 3 (three) times daily. 90 capsule 0  . levothyroxine (SYNTHROID, LEVOTHROID) 100 MCG tablet Take 100 mcg by mouth daily before breakfast.    . LORazepam (ATIVAN) 1 MG tablet Take 1 tablet (1 mg total) by mouth 2 (two) times daily as needed for anxiety. 30 tablet 0  . naltrexone (DEPADE) 50 MG tablet Take 1 tablet (50 mg total) by mouth daily. 30 tablet 0  . predniSONE (DELTASONE) 10 MG tablet Take 10 mg by mouth See admin instructions. Taper 3,3,3,2,2,2,1,1,1    . risperiDONE (RISPERDAL) 0.5 MG tablet Take 0.5 mg by mouth at bedtime.    . cephALEXin (KEFLEX) 500 MG capsule Take 1 capsule (500 mg total) by mouth 4 (four) times daily. 40 capsule 0    Musculoskeletal: Strength & Muscle Tone: decreased Gait & Station: unsteady Patient leans: N/A  Psychiatric Specialty Exam: Physical Exam  Nursing note and vitals reviewed. Constitutional: He appears well-developed and well-nourished.  HENT:  Head: Normocephalic and atraumatic.  Eyes: Conjunctivae are normal. Pupils are equal, round, and reactive to light.  Neck: Normal range of motion.  Cardiovascular: Regular rhythm and normal heart sounds.   Respiratory: No respiratory distress.  GI: Soft.  Musculoskeletal:  Normal range of motion.  Neurological: He is alert.  Skin: Skin is warm and dry.     Psychiatric: His affect is blunt. His speech is delayed. He is slowed. He is not actively hallucinating. Thought content is not delusional. He expresses impulsivity. He expresses no suicidal ideation. He  exhibits abnormal recent memory.    Review of Systems  Constitutional: Negative.   HENT: Negative.   Eyes: Negative.   Respiratory: Negative.   Cardiovascular: Negative.   Gastrointestinal: Negative.   Musculoskeletal: Negative.   Skin: Negative.   Neurological: Negative.   Psychiatric/Behavioral: Positive for memory loss and substance abuse. Negative for depression, suicidal ideas and hallucinations. The patient is nervous/anxious. The patient does not have insomnia.     Blood pressure 129/99, pulse 89, temperature 97.8 F (36.6 C), temperature source Oral, resp. rate 18, height _0  (1.626 m), weight 65.318 kg (144 lb), SpO2 99 %.Body mass index is 24.71 kg/(m^2).  General Appearance: Disheveled  Eye Contact:  Minimal  Speech:  Garbled  Volume:  Decreased  Mood:  Dysphoric  Affect:  Constricted and Flat  Thought Process:  Disorganized  Orientation:  Full (Time, Place, and Person)  Thought Content:  Simplistic and overly focused on wanting to go home. Typical of intoxication  Suicidal Thoughts:  No  Homicidal Thoughts:  No  Memory:  Immediate;   Fair Recent;   Poor Remote;   Fair  Judgement:  Impaired  Insight:  Lacking  Psychomotor Activity:  Decreased  Concentration:  Concentration: Poor  Recall:  Poor  Fund of Knowledge:  Poor  Language:  Poor  Akathisia:  No  Handed:  Right  AIMS (if indicated):     Assets:  Catering manager Housing Physical Health Social Support  ADL's:  Impaired  Cognition:  Impaired,  Moderate  Sleep:        Treatment Plan Summary: Daily contact with patient to assess and evaluate symptoms and progress in treatment, Medication management and Plan He is able to state an understanding that his alcohol abuse has reached the point of clearly being life-threatening in more ways than one. He has been advised of the benefits of potentially going to a rehabilitation or residential facility. Wife has done research and has looked into  appropriate facilities. We reviewed the facilities he has already been to in the area. There does not seem to be any other immediately available treatment we have to offer. He no longer meets commitment criteria now that he is sober. Counseling with the patient. Reviewed with emergency room doctor. He can be discharged from the emergency room to outpatient follow-up  Disposition: Supportive therapy provided about ongoing stressors. Discussed crisis plan, support from social network, calling 911, coming to the Emergency Department, and calling Suicide Hotline.  Alethia Berthold, MD 02/08/2016 3:45 PM

## 2016-02-08 NOTE — ED Notes (Signed)
Pt on phone with wife 

## 2016-02-08 NOTE — ED Notes (Addendum)
Pts wife came back to room w/ Karen Kitchens.  She stated that she had found rehab facility that would take patient and she would like to speak with MD.  Kerry Dory and [place] notified.

## 2016-02-08 NOTE — BH Assessment (Signed)
Received phone call from Care Coordinator [phone]). He reported the patient have history of not following up with outpatient referrals. He also stated he was going to come visit him while he's in the ER. He's expecting to arrived around 1pm.  Writer informed patient's nurse.

## 2016-02-20 ENCOUNTER — Other Ambulatory Visit: Payer: Self-pay | Admitting: Orthopedic Surgery

## 2016-02-20 DIAGNOSIS — M4726 Other spondylosis with radiculopathy, lumbar region: Secondary | ICD-10-CM

## 2016-02-29 ENCOUNTER — Ambulatory Visit
Admission: RE | Admit: 2016-02-29 | Discharge: 2016-02-29 | Disposition: A | Discharge status: Home / Self Care | Payer: Medicaid Other | Source: Ambulatory Visit | Attending: Orthopedic Surgery | Admitting: Orthopedic Surgery

## 2016-02-29 DIAGNOSIS — M5126 Other intervertebral disc displacement, lumbar region: Secondary | ICD-10-CM | POA: Insufficient documentation

## 2016-02-29 DIAGNOSIS — M4726 Other spondylosis with radiculopathy, lumbar region: Secondary | ICD-10-CM

## 2016-02-29 MED ORDER — GADOBENATE DIMEGLUMINE 529 MG/ML IV SOLN
15.0000 mL | Freq: Once | INTRAVENOUS | Status: AC | PRN
  Administered 2016-02-29: 15 mL via INTRAVENOUS

## 2016-03-27 ENCOUNTER — Ambulatory Visit: Payer: Medicaid Other | Admitting: Physical Therapy

## 2016-04-02 ENCOUNTER — Ambulatory Visit: Payer: Medicaid Other | Admitting: Physical Therapy

## 2016-04-10 DIAGNOSIS — F329 Major depressive disorder, single episode, unspecified: Secondary | ICD-10-CM | POA: Insufficient documentation

## 2016-04-10 DIAGNOSIS — F419 Anxiety disorder, unspecified: Secondary | ICD-10-CM | POA: Insufficient documentation

## 2016-04-11 ENCOUNTER — Ambulatory Visit: Payer: Medicaid Other | Admitting: Physical Therapy

## 2016-04-18 DIAGNOSIS — Y9389 Activity, other specified: Secondary | ICD-10-CM | POA: Insufficient documentation

## 2016-04-18 DIAGNOSIS — Y929 Unspecified place or not applicable: Secondary | ICD-10-CM | POA: Insufficient documentation

## 2016-04-18 DIAGNOSIS — S0003XA Contusion of scalp, initial encounter: Secondary | ICD-10-CM | POA: Insufficient documentation

## 2016-04-18 DIAGNOSIS — E039 Hypothyroidism, unspecified: Secondary | ICD-10-CM | POA: Insufficient documentation

## 2016-04-18 DIAGNOSIS — W1839XA Other fall on same level, initial encounter: Secondary | ICD-10-CM | POA: Insufficient documentation

## 2016-04-18 DIAGNOSIS — Y999 Unspecified external cause status: Secondary | ICD-10-CM | POA: Insufficient documentation

## 2016-04-18 NOTE — ED Triage Notes (Addendum)
Pt to triage via w/c with no distress noted; pt reports has been drinking and fell hitting on concrete; c/o HA; denies cervical tenderness with palpation; daughter reports witnessed incident and pt with +LOC; hematoma noted to occipital area

## 2016-04-18 NOTE — ED Triage Notes (Signed)
Pt fell hematoma and abrasion to head, no loc. Pt is intoxicated at this time.

## 2016-04-19 ENCOUNTER — Emergency Department: Payer: Medicaid Other

## 2016-04-19 ENCOUNTER — Emergency Department
Admission: EM | Admit: 2016-04-19 | Discharge: 2016-04-19 | Disposition: A | Discharge status: Home / Self Care | Payer: Medicaid Other | Attending: Emergency Medicine | Admitting: Emergency Medicine

## 2016-04-19 ENCOUNTER — Encounter: Payer: Self-pay | Admitting: Emergency Medicine

## 2016-04-19 DIAGNOSIS — S0003XA Contusion of scalp, initial encounter: Secondary | ICD-10-CM

## 2016-04-19 DIAGNOSIS — IMO0002 Reserved for concepts with insufficient information to code with codable children: Secondary | ICD-10-CM

## 2016-04-19 DIAGNOSIS — S0990XA Unspecified injury of head, initial encounter: Secondary | ICD-10-CM

## 2016-04-19 DIAGNOSIS — E039 Hypothyroidism, unspecified: Secondary | ICD-10-CM | POA: Insufficient documentation

## 2016-04-19 DIAGNOSIS — F1020 Alcohol dependence, uncomplicated: Secondary | ICD-10-CM | POA: Insufficient documentation

## 2016-04-19 NOTE — ED Notes (Signed)
BEHAVIORAL HEALTH ROUNDING  Patient sleeping: Yes Patient alert and oriented: Sleeping Behavior appropriate: Yes. ; If no, describe:  Nutrition and fluids offered: No, sleeping  Toileting and hygiene offered: No, sleeping  Sitter present: q15 minute observations and security monitoring  Law enforcement present: Yes ODS 

## 2016-04-19 NOTE — ED Provider Notes (Signed)
[facility] Emergency Department Provider Note        Time seen: ----------------------------------------- 8:07 PM on 04/19/2016 -----------------------------------------    I have reviewed the triage vital signs and the nursing notes.   HISTORY  Chief Complaint Mental Health Problem    HPI Ivan Hamilton is a 50 y.o. male who presents to ER after being seen here last night for alcohol related fall. Patient reported that time he wants detox and he has follow-up with detox centers in the past. Patient states he did not like the residential ones because they were more like a prison and he can leave. He denies suicidal or homicidal ideation. Daughter reported yesterday that he had just completed a detox program was referred to an outpatient treatment there refused to accept him because he has chronic back pain and he couldn't perform manual labor.Patient reports he's been drinking again.   Past Medical History:  Diagnosis Date  . Alcohol withdrawal ([place])   . B12 deficiency   . Chronic pain   . Hypothyroidism   . Varicose veins     Patient Active Problem List   Diagnosis Date Noted  . Alcohol withdrawal delirium ([place])   . Alcohol withdrawal ([place]) 09/02/2015  . Alcohol use disorder, severe, dependence ([place]) 09/02/2015  . 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[place]) 09/02/2015  . PTSD (post-traumatic stress disorder) 09/01/2015  . Hypothyroid 09/01/2015  . Gastric reflux 09/01/2015  . DDD (degenerative disc disease), lumbosacral 09/01/2015    Past Surgical History:  Procedure Laterality Date  . LEG SURGERY      Allergies Review of patient's allergies indicates no known allergies.  Social History Social History  Substance Use Topics  . Smoking status: Never Smoker  . Smokeless tobacco: Never Used  . Alcohol use Yes     Comment: daily- 1 pint of vodka    Review of  Systems Constitutional: Negative for fever. Cardiovascular: Negative for chest pain. Respiratory: Negative for shortness of breath. Gastrointestinal: Negative for abdominal pain, vomiting and diarrhea. Musculoskeletal: Negative for back pain. Skin: Negative for rash. Neurological: Negative for headaches, focal weakness or numbness. Psychiatric: Negative for suicidal or homicidal ideation  10-point ROS otherwise negative.  ____________________________________________   PHYSICAL EXAM:  VITAL SIGNS: ED Triage Vitals  Enc Vitals Group     BP 04/19/16 1945 103/80     Pulse Rate 04/19/16 1945 (!) 104     Resp 04/19/16 1945 18     Temp 04/19/16 1945 98.1 F (36.7 C)     Temp Source 04/19/16 1945 Oral     SpO2 04/19/16 1945 95 %     Weight 04/19/16 1951 137 lb (62.1 kg)     Height 04/19/16 1951 5\' 3"  (1.6 m)     Head Circumference --      Peak Flow --      Pain Score --      Pain Loc --      Pain Edu? --      Excl. in [place]? --     Constitutional: Alert and oriented. No acute distress Eyes: Conjunctivae are normal. PERRL. Normal extraocular movements. ENT   Head: Normocephalic and atraumatic.   Nose: No congestion/rhinnorhea.   Mouth/Throat: Mucous membranes are moist.   Neck: No stridor. Cardiovascular: Normal rate, regular rhythm. No murmurs, rubs, or gallops. Respiratory: Normal respiratory effort without tachypnea nor retractions. Breath sounds are clear and equal bilaterally. No wheezes/rales/rhonchi. Gastrointestinal: Soft and nontender. Normal  bowel sounds Musculoskeletal: Nontender with normal range of motion in all extremities. No lower extremity tenderness nor edema. Neurologic:  Normal speech and language. No gross focal neurologic deficits are appreciated.  Skin:  Skin is warm, dry and intact. No rash noted. Psychiatric: Mood and affect are normal. Speech and behavior are normal.  ____________________________________________  ED COURSE:  Pertinent  labs & imaging results that were available during my care of the patient were reviewed by me and considered in my medical decision making (see chart for details). Clinical Course  Patient is in no distress, states he did not like the detox facility he went to. I have advised him he can either abide by the detox facilities rules or choose to detox and some other manner. At this point there are no further services we can offer him.  Procedures ____________________________________________  FINAL ASSESSMENT AND PLAN  Alcohol use disorder  Plan: Patient with negative CT imaging from yesterday. Patient's no acute distress, stable for discharge.   Earleen Newport, MD   Note: This dictation was prepared with Dragon dictation. Any transcriptional errors that result from this process are unintentional    Earleen Newport, MD 04/19/16 2009

## 2016-04-19 NOTE — ED Triage Notes (Signed)
Pt to triage via w/c with no distress noted; seen here last night for +ETOH and fall; pt reports he wants detox; denies SI or HI; daughter reported yesterday that pt had just completed detox program and was referred to outpatient treatment but they refused to accept him because of his "chronic back pain" and his inability to perform any manual labor

## 2016-04-19 NOTE — ED Notes (Signed)
Discharge instructions reviewed with patient. Patient verbalized understanding. Patient attempting to find ride.

## 2016-04-20 NOTE — ED Provider Notes (Signed)
[facility] Emergency Department Provider Note    First MD Initiated Contact with Patient 04/19/16 0300     (approximate)  I have reviewed the triage vital signs and the nursing notes.   HISTORY  Chief Complaint Head Laceration    HPI Ivan Hamilton is a 50 y.o. male with history of alcohol abuse of recent discharge from detox "few days ago presents to the emergency department after fall with occipital head injury. Patient states "I drank a lot of alcohol today".   Past Medical History:  Diagnosis Date  . Alcohol withdrawal ([place])   . B12 deficiency   . Chronic pain   . Hypothyroidism   . Varicose veins     Patient Active Problem List   Diagnosis Date Noted  . Alcohol withdrawal delirium ([place])   . Alcohol withdrawal ([place]) 09/02/2015  . Alcohol use disorder, severe, dependence ([place]) 09/02/2015  . 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[place]) 09/02/2015  . PTSD (post-traumatic stress disorder) 09/01/2015  . Hypothyroid 09/01/2015  . Gastric reflux 09/01/2015  . DDD (degenerative disc disease), lumbosacral 09/01/2015    Past Surgical History:  Procedure Laterality Date  . LEG SURGERY      Prior to Admission medications   Medication Sig Start Date End Date Taking? Authorizing Provider  cephALEXin (KEFLEX) 500 MG capsule Take 1 capsule (500 mg total) by mouth 4 (four) times daily. 02/06/16   Delman Kitten, MD  FLUoxetine (PROZAC) 20 MG capsule Take 1 capsule (20 mg total) by mouth daily. 09/05/15   Hildred Priest, MD  gabapentin (NEURONTIN) 400 MG capsule Take 1 capsule (400 mg total) by mouth 3 (three) times daily. 09/05/15   Hildred Priest, MD  levothyroxine (SYNTHROID, LEVOTHROID) 100 MCG tablet Take 100 mcg by mouth daily before breakfast.    Historical Provider, MD  LORazepam (ATIVAN) 1 MG tablet Take 1 tablet (1 mg total) by mouth 2 (two) times daily as  needed for anxiety. 11/05/15   Lytle Butte, MD  naltrexone (DEPADE) 50 MG tablet Take 1 tablet (50 mg total) by mouth daily. 09/05/15   Hildred Priest, MD  predniSONE (DELTASONE) 10 MG tablet Take 10 mg by mouth See admin instructions. Taper 3,3,3,2,2,2,1,1,1    Historical Provider, MD  risperiDONE (RISPERDAL) 0.5 MG tablet Take 0.5 mg by mouth at bedtime.    Historical Provider, MD    Allergies No known drug allergies No family history on file.  Social History Social History  Substance Use Topics  . Smoking status: Never Smoker  . Smokeless tobacco: Never Used  . Alcohol use Yes     Comment: daily- 1 pint of vodka    Review of Systems Constitutional: No fever/chills Eyes: No visual changes. ENT: No sore throat. Cardiovascular: Denies chest pain. Respiratory: Denies shortness of breath. Gastrointestinal: No abdominal pain.  No nausea, no vomiting.  No diarrhea.  No constipation. Genitourinary: Negative for dysuria. Musculoskeletal: Negative for back pain. Skin: Negative for rash. Neurological: Positive for headache negative for focal weakness or numbness.  10-point ROS otherwise negative.  ____________________________________________   PHYSICAL EXAM:  VITAL SIGNS: ED Triage Vitals  Enc Vitals Group     BP 04/19/16 0000 101/87     Pulse Rate 04/19/16 0000 92     Resp 04/19/16 0000 18     Temp 04/19/16 0000 98 F (36.7 C)     Temp Source 04/19/16 0000 Oral     SpO2 04/19/16 0000 97 %  Weight 04/19/16 0001 137 lb (62.1 kg)     Height 04/19/16 0001 5\' 3"  (1.6 m)     Head Circumference --      Peak Flow --      Pain Score 04/19/16 0001 7     Pain Loc --      Pain Edu? --      Excl. in [place]? --     Constitutional: Alert and oriented. EtOH on breath Eyes: Conjunctivae are normal. PERRL. EOMI. Head: 5 x 5" occipital swelling with overlying abrasion Ears:  Healthy appearing ear canals and TMs bilaterally Nose: No congestion/rhinnorhea. Mouth/Throat:  Mucous membranes are moist.  Oropharynx non-erythematous. Neck: No stridor.  No meningeal signs.  No cervical spine tenderness to palpation. Cardiovascular: Normal rate, regular rhythm. Good peripheral circulation. Grossly normal heart sounds. Respiratory: Normal respiratory effort.  No retractions. Lungs CTAB. Gastrointestinal: Soft and nontender. No distention.  Musculoskeletal: No lower extremity tenderness nor edema. No gross deformities of extremities. Neurologic:  Normal speech and language. No gross focal neurologic deficits are appreciated.  Skin:  Skin is warm, dry and intact. No rash noted. Psychiatric: Mood and affect are normal. Speech and behavior are normal.  ____________________________________________   LABS (all labs ordered are listed, but only abnormal results are displayed)  Labs Reviewed - No data to display ________________________________  RADIOLOGY I, [place], personally viewed and evaluated these images (plain radiographs) as part of my medical decision making, as well as reviewing the written report by the radiologist.    Procedures      INITIAL IMPRESSION / [place] / ED COURSE  Pertinent labs & imaging results that were available during my care of the patient were reviewed by me and considered in my medical decision making (see chart for details).   Patient discharge in the custody of his adult daughter. Patient advised to seek detox and to return to the emergency department if he chooses to do so.  Clinical Course    ____________________________________________  FINAL CLINICAL IMPRESSION(S) / ED DIAGNOSES  Final diagnoses:  Head injury, initial encounter  Scalp hematoma, initial encounter     MEDICATIONS GIVEN DURING THIS VISIT:  Medications - No data to display   NEW OUTPATIENT MEDICATIONS STARTED DURING THIS VISIT:  Discharge Medication List as of 04/19/2016  3:51 AM      Discharge Medication List as of  04/19/2016  3:51 AM      Discharge Medication List as of 04/19/2016  3:51 AM       Note:  This document was prepared using Dragon voice recognition software and may include unintentional dictation errors.    Gregor Hams, MD 04/20/16 [phone]

## 2016-08-27 ENCOUNTER — Emergency Department
Admission: EM | Admit: 2016-08-27 | Discharge: 2016-08-28 | Disposition: A | Discharge status: Home / Self Care | Payer: 59 | Attending: Emergency Medicine | Admitting: Emergency Medicine

## 2016-08-27 DIAGNOSIS — Z79899 Other long term (current) drug therapy: Secondary | ICD-10-CM | POA: Insufficient documentation

## 2016-08-27 DIAGNOSIS — F1024 Alcohol dependence with alcohol-induced mood disorder: Secondary | ICD-10-CM

## 2016-08-27 DIAGNOSIS — R45851 Suicidal ideations: Secondary | ICD-10-CM

## 2016-08-27 DIAGNOSIS — F10120 Alcohol abuse with intoxication, uncomplicated: Secondary | ICD-10-CM | POA: Insufficient documentation

## 2016-08-27 DIAGNOSIS — F10920 Alcohol use, unspecified with intoxication, uncomplicated: Secondary | ICD-10-CM

## 2016-08-27 DIAGNOSIS — E039 Hypothyroidism, unspecified: Secondary | ICD-10-CM | POA: Insufficient documentation

## 2016-08-27 DIAGNOSIS — F3289 Other specified depressive episodes: Secondary | ICD-10-CM

## 2016-08-27 DIAGNOSIS — F4310 Post-traumatic stress disorder, unspecified: Secondary | ICD-10-CM | POA: Diagnosis present

## 2016-08-27 DIAGNOSIS — F10229 Alcohol dependence with intoxication, unspecified: Secondary | ICD-10-CM | POA: Diagnosis present

## 2016-08-27 DIAGNOSIS — F1020 Alcohol dependence, uncomplicated: Secondary | ICD-10-CM | POA: Diagnosis present

## 2016-08-27 DIAGNOSIS — F4312 Post-traumatic stress disorder, chronic: Secondary | ICD-10-CM | POA: Diagnosis present

## 2016-08-27 LAB — CBC
HCT: 47.7 % (ref 40.0–52.0)
Hemoglobin: 15.6 g/dL (ref 13.0–18.0)
MCH: 27.3 pg (ref 26.0–34.0)
MCHC: 32.8 g/dL (ref 32.0–36.0)
MCV: 83.2 fL (ref 80.0–100.0)
Platelets: 387 10*3/uL (ref 150–440)
RBC: 5.73 MIL/uL (ref 4.40–5.90)
RDW: 14.2 % (ref 11.5–14.5)
WBC: 10.3 10*3/uL (ref 3.8–10.6)

## 2016-08-27 LAB — COMPREHENSIVE METABOLIC PANEL
ALT: 17 U/L (ref 17–63)
AST: 30 U/L (ref 15–41)
Albumin: 4.6 g/dL (ref 3.5–5.0)
Alkaline Phosphatase: 79 U/L (ref 38–126)
Anion gap: 11 (ref 5–15)
BUN: 9 mg/dL (ref 6–20)
CO2: 25 mmol/L (ref 22–32)
Calcium: 9.1 mg/dL (ref 8.9–10.3)
Chloride: 106 mmol/L (ref 101–111)
Creatinine, Ser: 0.97 mg/dL (ref 0.61–1.24)
GFR calc Af Amer: >60 mL/min (ref >60)
GFR calc non Af Amer: >60 mL/min (ref >60)
Glucose, Bld: 119 mg/dL — ABNORMAL HIGH (ref 65–99)
Potassium: 4.1 mmol/L (ref 3.5–5.1)
Sodium: 142 mmol/L (ref 135–145)
Total Bilirubin: 1.1 mg/dL (ref 0.3–1.2)
Total Protein: 9 g/dL — ABNORMAL HIGH (ref 6.5–8.1)

## 2016-08-27 LAB — ETHANOL: Alcohol, Ethyl (B): 331 mg/dL (ref <5)

## 2016-08-27 NOTE — ED Notes (Addendum)
Pt dressed out by this tech with two male Building control surveyor in the room. Pt belongings consisted of a green jacket, a black jacket, a black sweatshirt, a green shirt, blue jeans, black tennis shoes, a brown belt, a pair of black socks and a pair of blue boxers. Pt has a tan wallet with (1) $5 and (5) $1's.

## 2016-08-27 NOTE — ED Triage Notes (Addendum)
Pt stated to someone that he was going to end it for him and his family - IVC for suicidal ideation - pt is intoxicated - at this time he is denying SI or HI

## 2016-08-28 DIAGNOSIS — F1024 Alcohol dependence with alcohol-induced mood disorder: Secondary | ICD-10-CM | POA: Diagnosis not present

## 2016-08-28 DIAGNOSIS — F3289 Other specified depressive episodes: Secondary | ICD-10-CM

## 2016-08-28 DIAGNOSIS — F10229 Alcohol dependence with intoxication, unspecified: Secondary | ICD-10-CM

## 2016-08-28 LAB — URINE DRUG SCREEN, QUALITATIVE (ARMC ONLY)
Amphetamines, Ur Screen: NOT DETECTED
Barbiturates, Ur Screen: NOT DETECTED
Benzodiazepine, Ur Scrn: POSITIVE — AB
Cannabinoid 50 Ng, Ur ~~LOC~~: NOT DETECTED
Cocaine Metabolite,Ur ~~LOC~~: NOT DETECTED
MDMA (Ecstasy)Ur Screen: NOT DETECTED
Methadone Scn, Ur: NOT DETECTED
Opiate, Ur Screen: NOT DETECTED
Phencyclidine (PCP) Ur S: NOT DETECTED
Tricyclic, Ur Screen: NOT DETECTED

## 2016-08-28 NOTE — Consult Note (Signed)
[facility]iat[facility] Consult   Reason for Consult:  Consult for 51 year old man with a history of alcohol abuse who presented to the emergency room intoxicated Referring Physician:  Corky Downs Patient Identification: Ivan Hamilton MRN:  [id number] Principal Diagnosis: Alcohol-induced depressive disorder with moderate or severe use disorder with onset during intoxication [facility]) Diagnosis:   Patient Active Problem List   Diagnosis Date Noted  . Alcohol withdrawal delirium ([place]) [F10.231]   . Alcohol withdrawal ([place]) [F10.239] 09/02/2015  . Alcohol use disorder, severe, dependence ([place]) [F10.20] 09/02/2015  . 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[place]) [F10.24, F10.229, F32.89] 09/02/2015  . PTSD (post-traumatic stress disorder) [F43.10] 09/01/2015  . Hypothyroid [E03.9] 09/01/2015  . Gastric reflux [K21.9] 09/01/2015  . DDD (degenerative disc disease), lumbosacral [M51.37] 09/01/2015    Total Time spent with patient: 1 hour  Subjective:   Ivan Hamilton is a 51 y.o. male patient admitted with "I've just been drinking a couple days".  HPI:  Patient interviewed. Chart reviewed. Patient known from multiple prior encounters. 51 year old man with long-standing alcohol dependence. Brought into the hospital under involuntary commitment with allegations that he had made suicidal statements. Blood alcohol level on presentation over 300. Patient is sober today and tells me that he relapsed into drinking only about 3 days ago. During that time he has consumed a couple of bottles of tequila. Prior to that he claims he had been sober for about 4-6 months. And does not deny that he could've made a suicidal statement but says he does not remember doing it. Today he has absolutely no suicidal thoughts. Not feeling depressed. No thoughts of harming anyone else. He says that he is not having any hallucinations today and not feeling  confused. He expresses regret for having relapsed and 8 that he intends to get back into his intensive program again and try and stay sober into the future.  Medical history: Patient has a history of ulcer. Otherwise in fairly stable health given his heavy alcohol use.  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 Lives in the [place] with his wife and family. Currently not able to work because of his heavy alcohol abuse and long history of PTSD.  Substance abuse history: Long history of alcohol abuse going back decades. We have seen him many times in the hospital for withdrawal. He has had delirium tremens at times in the past when he is been drinking very heavily. He says that recently he had been participating in a program for several months that has been keeping him sober.  Past Psychiatric History: Patient has a history of posttraumatic stress disorder related to events that occurred in [employer] action in [place]. Does have chronic anxiety and social isolation and jumpiness. Has had suicidal thoughts in the past but has not had any suicide attempts.  Risk to Self: Suicidal Ideation: No Suicidal Intent: No Is patient at risk for suicide?: No Suicidal Plan?: No Access to Means: No What has been your use of drugs/alcohol within the last 12 months?: Alcohol  How many times?: 0 Other Self Harm Risks: 0 Triggers for Past Attempts: Other (Comment) Intentional Self Injurious Behavior: None Risk to Others: Homicidal Ideation: No Thoughts of Harm to Others: No Current Homicidal Intent: No Current Homicidal Plan: No Access to Homicidal Means: No Identified Victim: N/a History of harm to others?: No Assessment of Violence: None Noted Violent Behavior Description: n/a Does patient have access to weapons?: No Criminal Charges  Pending?: No Does patient have a court date: No Prior Inpatient Therapy: Prior Inpatient Therapy: Yes Prior Therapy Dates: 03/2016 Prior Therapy  Facilty[facility]Provider(s): [facility] Reason for Treatment: Delirium Prior Outpatient Therapy: Prior Outpatient Therapy: No Prior Therapy Dates: 2017 Prior Therapy Facilty/Provider(s): Unknown Reason for Treatment: SA Does patient have an ACCT team?: No Does patient have Intensive In-House Services?  : No Does patient have [facility] services? : No Does patient have P4CC services?: No  Past Medical History:  Past Medical History:  Diagnosis Date  . Alcohol withdrawal ([place])   . B12 deficiency   . Chronic pain   . Hypothyroidism   . Varicose veins     Past Surgical History:  Procedure Laterality Date  . LEG SURGERY     Family History: No family history on file. Family Psychiatric  History: Family history of alcohol abuse Social History:  History  Alcohol Use  . Yes    Comment: daily- 1 pint of vodka     History  Drug Use No    Social History   Social History  . Marital status: Married    Spouse name: N/A  . Number of children: N/A  . Years of education: N/A   Social History Main Topics  . Smoking status: Never Smoker  . Smokeless tobacco: Never Used  . Alcohol use Yes     Comment: daily- 1 pint of vodka  . Drug use: No  . Sexual activity: Not Asked   Other Topics Concern  . None   Social History Narrative  . None   Additional Social History:    Allergies:  No Known Allergies  Labs:  Results for orders placed or performed during the hospital encounter of 08/27/16 (from the past 48 hour(s))  Comprehensive metabolic panel     Status: Abnormal   Collection Time: 08/27/16 10:26 PM  Result Value Ref Range   Sodium 142 135 - 145 mmol/L   Potassium 4.1 3.5 - 5.1 mmol/L   Chloride 106 101 - 111 mmol/L   CO2 25 22 - 32 mmol/L   Glucose, Bld 119 (H) 65 - 99 mg/dL   BUN 9 6 - 20 mg/dL   Creatinine, Ser 0.97 0.61 - 1.24 mg/dL   Calcium 9.1 8.9 - 10.3 mg/dL   Total Protein 9.0 (H) 6.5 - 8.1 g/dL   Albumin 4.6 3.5 - 5.0 g/dL   AST 30 15 - 41 U/L   ALT 17 17 - 63 U/L    Alkaline Phosphatase 79 38 - 126 U/L   Total Bilirubin 1.1 0.3 - 1.2 mg/dL   GFR calc non Af Amer >60 >60 mL/min   GFR calc Af Amer >60 >60 mL/min    Comment: (NOTE) The eGFR has been calculated using the CKD EPI equation. This calculation has not been validated in all clinical situations. eGFR's persistently <60 mL/min signify possible Chronic Kidney Disease.    Anion gap 11 5 - 15  Ethanol     Status: Abnormal   Collection Time: 08/27/16 10:26 PM  Result Value Ref Range   Alcohol, Ethyl (B) 331 (HH) <5 mg/dL    Comment: CRITICAL RESULT CALLED TO, READ BACK BY AND VERIFIED WITH VANESSA BRYANT ON 08/27/16 AT 2330 BY TLB        LOWEST DETECTABLE LIMIT FOR SERUM ALCOHOL IS 5 mg/dL FOR MEDICAL PURPOSES ONLY   cbc     Status: None   Collection Time: 08/27/16 10:26 PM  Result Value Ref Range   WBC 10.3  3.8 - 10.6 K/uL   RBC 5.73 4.40 - 5.90 MIL/uL   Hemoglobin 15.6 13.0 - 18.0 g/dL   HCT 47.7 40.0 - 52.0 %   MCV 83.2 80.0 - 100.0 fL   MCH 27.3 26.0 - 34.0 pg   MCHC 32.8 32.0 - 36.0 g/dL   RDW 14.2 11.5 - 14.5 %   Platelets 387 150 - 440 K/uL  Urine Drug Screen, Qualitative     Status: Abnormal   Collection Time: 08/28/16  2:49 AM  Result Value Ref Range   Tricyclic, Ur Screen NONE DETECTED NONE DETECTED   Amphetamines, Ur Screen NONE DETECTED NONE DETECTED   MDMA (Ecstasy)Ur Screen NONE DETECTED NONE DETECTED   Cocaine Metabolite,Ur [place] NONE DETECTED NONE DETECTED   Opiate, Ur Screen NONE DETECTED NONE DETECTED   Phencyclidine (PCP) Ur S NONE DETECTED NONE DETECTED   Cannabinoid 50 Ng, Ur [place] NONE DETECTED NONE DETECTED   Barbiturates, Ur Screen NONE DETECTED NONE DETECTED   Benzodiazepine, Ur Scrn POSITIVE (A) NONE DETECTED   Methadone Scn, Ur NONE DETECTED NONE DETECTED    Comment: (NOTE) 175  Tricyclics, urine               Cutoff 1000 ng/mL 200  Amphetamines, urine             Cutoff 1000 ng/mL 300  MDMA (Ecstasy), urine           Cutoff 500 ng/mL 400  Cocaine  Metabolite, urine       Cutoff 300 ng/mL 500  Opiate, urine                   Cutoff 300 ng/mL 600  Phencyclidine (PCP), urine      Cutoff 25 ng/mL 700  Cannabinoid, urine              Cutoff 50 ng/mL 800  Barbiturates, urine             Cutoff 200 ng/mL 900  Benzodiazepine, urine           Cutoff 200 ng/mL 1000 Methadone, urine                Cutoff 300 ng/mL 1100 1200 The urine drug screen provides only a preliminary, unconfirmed 1300 analytical test result and should not be used for non-medical 1400 purposes. Clinical consideration and professional judgment should 1500 be applied to any positive drug screen result due to possible 1600 interfering substances. A more specific alternate chemical method 1700 must be used in order to obtain a confirmed analytical result.  1800 Gas chromato graphy / mass spectrometry (GC/MS) is the preferred 1900 confirmatory method.     No current facility-administered medications for this encounter.    Current Outpatient Prescriptions  Medication Sig Dispense Refill  . FLUoxetine (PROZAC) 20 MG capsule Take 1 capsule (20 mg total) by mouth daily. 30 capsule 0  . gabapentin (NEURONTIN) 400 MG capsule Take 1 capsule (400 mg total) by mouth 3 (three) times daily. 90 capsule 0  . levothyroxine (SYNTHROID, LEVOTHROID) 100 MCG tablet Take 100 mcg by mouth daily before breakfast.    . LORazepam (ATIVAN) 1 MG tablet Take 1 tablet (1 mg total) by mouth 2 (two) times daily as needed for anxiety. (Patient not taking: Reported on 08/28/2016) 30 tablet 0  . naltrexone (DEPADE) 50 MG tablet Take 1 tablet (50 mg total) by mouth daily. 30 tablet 0    Musculoskeletal: Strength & Muscle Tone: within normal limits Gait &  Station: normal Patient leans: N/A  Psychiatric Specialty Exam: Physical Exam  Nursing note and vitals reviewed. Constitutional: He appears well-developed and well-nourished.  HENT:  Head: Normocephalic and atraumatic.  Eyes: Conjunctivae are  normal. Pupils are equal, round, and reactive to light.  Neck: Normal range of motion.  Cardiovascular: Regular rhythm and normal heart sounds.   Respiratory: Effort normal. No respiratory distress.  GI: Soft.  Musculoskeletal: Normal range of motion.  Neurological: He is alert.  Skin: Skin is warm and dry.  Psychiatric: Judgment normal. His affect is blunt. His speech is delayed. He is slowed. Thought content is not paranoid. He expresses no homicidal and no suicidal ideation. He exhibits abnormal recent memory.    Review of Systems  Constitutional: Negative.   HENT: Negative.   Eyes: Negative.   Respiratory: Negative.   Cardiovascular: Negative.   Gastrointestinal: Negative.   Musculoskeletal: Negative.   Skin: Negative.   Neurological: Negative.   Psychiatric/Behavioral: Positive for memory loss and substance abuse. Negative for depression, hallucinations and suicidal ideas. The patient is not nervous/anxious and does not have insomnia.     Blood pressure 132/88, pulse 87, temperature 98.3 F (36.8 C), temperature source Oral, resp. rate 17, height 5' 3" (1.6 m), weight 69.9 kg (154 lb), SpO2 98 %.Body mass index is 27.28 kg/m.  General Appearance: Casual  Eye Contact:  Good  Speech:  Clear and Coherent  Volume:  Normal  Mood:  Euthymic  Affect:  Congruent  Thought Process:  Goal Directed  Orientation:  Full (Time, Place, and Person)  Thought Content:  Logical  Suicidal Thoughts:  No  Homicidal Thoughts:  No  Memory:  Immediate;   Good Recent;   Fair Remote;   Fair  Judgement:  Fair  Insight:  Fair  Psychomotor Activity:  Decreased  Concentration:  Concentration: Fair  Recall:  [employer] of Knowledge:  Fair  Language:  Fair  Akathisia:  No  Handed:  Right  AIMS (if indicated):     Assets:  Communication Skills Desire for Improvement Housing Resilience Social Support  ADL's:  Intact  Cognition:  WNL  Sleep:        Treatment Plan Summary: Plan  51 year old man with alcohol abuse who came back to the hospital intoxicated. No evidence that he tried to harm himself. Now sober completely denies suicidal ideation. Does not have major depression. Patient is not at elevated risk for self-harm except in the case of intoxication. Already has appropriate outpatient substance abuse treatment in place. Shows good insight and is agreeable to continuing with outpatient treatment. No longer meets commitment criteria. IVC discontinued. Patient can be released from the emergency room at the discretion of the ER physician and will follow-up with his outpatient substance abuse program  Disposition: Patient does not meet criteria for psychiatric inpatient admission. Supportive therapy provided about ongoing stressors.  Alethia Berthold, MD 08/28/2016 3:44 PM

## 2016-08-28 NOTE — ED Notes (Signed)
Psych consult completed  Discharge instructions reviewed with him   He denies pain

## 2016-08-28 NOTE — ED Notes (Signed)
BEHAVIORAL HEALTH ROUNDING Patient sleeping: No. Patient alert and oriented: yes Behavior appropriate: Yes.  ; If no, describe:  Nutrition and fluids offered: yes Toileting and hygiene offered: Yes  Sitter present: q15 minute observations and security  monitoring Law enforcement present: Yes  ODS  

## 2016-08-28 NOTE — ED Notes (Signed)
Pt given lunch tray.

## 2016-08-28 NOTE — ED Notes (Signed)
Pt stated he does not want to take a shower because he believes he will be leaving when the doctor comes around.

## 2016-08-28 NOTE — ED Notes (Signed)

## 2016-08-28 NOTE — ED Notes (Signed)
No am meds ordered at this time   Assessment completed He denies pain  Psych consult pending

## 2016-08-28 NOTE — BH Assessment (Signed)
Assessment Note  Ivan Hamilton is an 51 y.o. male. who reports that he does not know why he's in the emergency department. Patient states he was drinking over the weekend. Patient states that if he had to guess he would assume that his family called the police because they were scared. Patient continued to explain that a few years ago he was admitted to the psychiatric unit due to experiencing auditory and visual hallucinations. He states that he was hospitalized at this time. Patient's inpatient hospitalization was at [facility]. Patient reports that his family is probably scared because he began drinking again. Patient shared that after being discharged in September of 2017 he went to a ADACT and remained sober for several months. Patient also reports participating in outpatient therapy and taking lorazepam daily Patient states that his therapist recently relocated and that he ran out of medication on last week. Patient reports engaging with his care coordinator to resolve insurance issues so that he can go to his primary care physician. Patient denied experiencing any withdrawals at this time. Pt. denies any suicidal ideation, plan or intent. Pt. denies the presence of any auditory or visual hallucinations at this time. Patient denies any other medical complaints.   Diagnosis: Alcohol Use Disorder       Past Medical History:  Past Medical History:  Diagnosis Date  . Alcohol withdrawal ([place])   . B12 deficiency   . Chronic pain   . Hypothyroidism   . Varicose veins     Past Surgical History:  Procedure Laterality Date  . LEG SURGERY      Family History: No family history on file.  Social History:  reports that he has never smoked. He has never used smokeless tobacco. He reports that he drinks alcohol. He reports that he does not use drugs.  Additional Social History:  Alcohol / Drug Use Pain Medications: See MAR Prescriptions: See MAR Over the Counter: See PTA History of  alcohol / drug use?: Yes Longest period of sobriety (when/how long): A Few Months  Negative Consequences of Use: Personal relationships Withdrawal Symptoms:  (None at this time ) Substance #1 Name of Substance 1: Alcohol  1 - Age of First Use: 14 1 - Amount (size/oz):  1/5    1 - Frequency: daily, although pt states that he relapsed on 08/24/16 and drank until 08/26/16 1 - Duration: pt states that he relapsed on 08/24/16 and drank until 08/26/16 1 - Last Use / Amount: 08/26/16, 1/5  CIWA: CIWA-Ar BP: (!) 131/95 Pulse Rate: (!) 108 Nausea and Vomiting: no nausea and no vomiting Tactile Disturbances: none Tremor: no tremor Auditory Disturbances: not present Paroxysmal Sweats: no sweat visible Visual Disturbances: not present Anxiety: no anxiety, at ease Headache, Fullness in Head: none present Agitation: normal activity Orientation and Clouding of Sensorium: oriented and can do serial additions CIWA-Ar Total: 0 COWS:    Allergies: No Known Allergies  Home Medications:  (Not in a hospital admission)  OB/GYN Status:  No LMP for male patient.  General Assessment Data Location of Assessment: [facility] ED TTS Assessment: In system Is this a Tele or Face-to-Face Assessment?: Face-to-Face Is this an Initial Assessment or a Re-assessment for this encounter?: Initial Assessment Marital status: Married Is patient pregnant?: Other (Comment) Pregnancy Status: Other (Comment) Living Arrangements: Spouse/significant other, Children Can pt return to current living arrangement?: Yes Admission Status: Involuntary Is patient capable of signing voluntary admission?: No Referral Source: Other Insurance type: Medicaid   Medical Screening Exam [facility]  Walk-in ONLY) Medical Exam completed: Yes  Crisis Care Plan Living Arrangements: Spouse/significant other, Children Legal Guardian: Other: (None ) Name of Psychiatrist: None Name of Therapist: None   Education Status Is patient currently in school?:  Yes Current Grade: n/a Highest grade of school patient has completed:  (Pt attempting to get GED ) Name of school: Unknown  Contact person: n/a  Risk to self with the past 6 months Suicidal Ideation: No Has patient been a risk to self within the past 6 months prior to admission? : No Suicidal Intent: No Has patient had any suicidal intent within the past 6 months prior to admission? : No Is patient at risk for suicide?: No Suicidal Plan?: No Has patient had any suicidal plan within the past 6 months prior to admission? : No Access to Means: No What has been your use of drugs/alcohol within the last 12 months?: Alcohol  Previous Attempts/Gestures: No (pt denies ) How many times?: 0 Other Self Harm Risks: 0 Triggers for Past Attempts: Other (Comment) Intentional Self Injurious Behavior: None Family Suicide History: No Recent stressful life event(s): Other (Comment) Persecutory voices/beliefs?: No Depression: No Depression Symptoms:  (Pt denies ) Substance abuse history and/or treatment for substance abuse?: Yes Suicide prevention information given to non-admitted patients: Not applicable  Risk to Others within the past 6 months Homicidal Ideation: No Does patient have any lifetime risk of violence toward others beyond the six months prior to admission? : Yes (comment) Thoughts of Harm to Others: No Current Homicidal Intent: No Current Homicidal Plan: No Access to Homicidal Means: No Identified Victim: N/a History of harm to others?: No Assessment of Violence: None Noted Violent Behavior Description: n/a Does patient have access to weapons?: No Criminal Charges Pending?: No Does patient have a court date: No Is patient on probation?: No  Psychosis Hallucinations: None noted Delusions: None noted  Mental Status Report Appearance/Hygiene: In scrubs Eye Contact: Fair Motor Activity: Agitation Speech: Logical/coherent Level of Consciousness: Alert Mood:  Anxious Affect: Anxious Anxiety Level: Minimal Thought Processes: Relevant, Coherent Judgement: Partial Orientation: Time, Person, Place, Situation Obsessive Compulsive Thoughts/Behaviors: None  Cognitive Functioning Concentration: Fair Memory: Remote Intact, Recent Intact IQ: Average Insight: Fair Impulse Control: Fair Appetite: Fair Weight Loss: 0 Weight Gain: 0 Sleep: No Change Total Hours of Sleep: 8 Vegetative Symptoms: None  ADLScreening [facility] Assessment Services) Patient's cognitive ability adequate to safely complete daily activities?: Yes Patient able to express need for assistance with ADLs?: Yes Independently performs ADLs?: Yes (appropriate for developmental age)  Prior Inpatient Therapy Prior Inpatient Therapy: Yes Prior Therapy Dates: 03/2016 Prior Therapy Facilty/Provider(s): [facility] Reason for Treatment: Delirium  Prior Outpatient Therapy Prior Outpatient Therapy: No Prior Therapy Dates: 2017 Prior Therapy Facilty/Provider(s): Unknown Reason for Treatment: SA Does patient have an ACCT team?: No Does patient have Intensive In-House Services?  : No Does patient have [facility] services? : No Does patient have P4CC services?: No  ADL Screening (condition at time of admission) Patient's cognitive ability adequate to safely complete daily activities?: Yes Patient able to express need for assistance with ADLs?: Yes Independently performs ADLs?: Yes (appropriate for developmental age)       Abuse/Neglect Assessment (Assessment to be complete while patient is alone) Physical Abuse: Denies Verbal Abuse: Denies Sexual Abuse: Denies Exploitation of patient/patient's resources: Denies Self-Neglect: Denies Values / Beliefs Cultural Requests During Hospitalization: None Spiritual Requests During Hospitalization: None Consults Spiritual Care Consult Needed: No Social Work Consult Needed: No Regulatory affairs officer (For Healthcare) Does Patient Have  a Medical Advance  Directive?: No    Additional Information 1:1 In Past 12 Months?: No CIRT Risk: No Elopement Risk: No Does patient have medical clearance?: No     Disposition:  Disposition Initial Assessment Completed for this Encounter: Yes Disposition of Patient: Referred to (MD Consult ) Patient referred to: Other (Comment) (Psych MD Consult )  On Site Evaluation by:   Reviewed with Physician:    Laretta Alstrom 08/28/2016 12:20 PM

## 2016-08-28 NOTE — ED Provider Notes (Signed)
[facility] Emergency Department Provider Note   ____________________________________________   First MD Initiated Contact with Patient 08/28/16 0004     (approximate)  I have reviewed the triage vital signs and the nursing notes.   HISTORY  Chief Complaint Suicidal    HPI Ivan Hamilton is a 51 y.o. male who comes into the hospital today with alcohol intoxication. The patient reports that he doesn't know why he is here. He reports that the police put him in handcuffs and brought him here. He reports that he didn't drink anything and hadn't done anything. He reports he was buying something to drink but he hadn't drank anything. He was drinking all weekend. The patient reports that he's not sure who called the police but thinks it may have been his daughter. He denies making threats to hurt himself or threats to hurt anyone else. He has depression and reports that his psychiatrist moved away so he hasn't had one to talk to. He also reports that he has some chronic back pain he drinks to help with the back pain. The patient is here for evaluation.   Past Medical History:  Diagnosis Date  . Alcohol withdrawal ([place])   . B12 deficiency   . Chronic pain   . Hypothyroidism   . Varicose veins     Patient Active Problem List   Diagnosis Date Noted  . Alcohol withdrawal delirium ([place])   . Alcohol withdrawal ([place]) 09/02/2015  . Alcohol use disorder, severe, dependence ([place]) 09/02/2015  . 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 PTSD (post-traumatic stress disorder) 09/01/2015  . Hypothyroid 09/01/2015  . Gastric reflux 09/01/2015  . DDD (degenerative disc disease), lumbosacral 09/01/2015    Past Surgical History:  Procedure Laterality Date  . LEG SURGERY      Prior to Admission medications   Medication Sig Start Date End Date Taking? Authorizing Provider  cephALEXin  (KEFLEX) 500 MG capsule Take 1 capsule (500 mg total) by mouth 4 (four) times daily. 02/06/16   Delman Kitten, MD  FLUoxetine (PROZAC) 20 MG capsule Take 1 capsule (20 mg total) by mouth daily. 09/05/15   Hildred Priest, MD  gabapentin (NEURONTIN) 400 MG capsule Take 1 capsule (400 mg total) by mouth 3 (three) times daily. 09/05/15   Hildred Priest, MD  levothyroxine (SYNTHROID, LEVOTHROID) 100 MCG tablet Take 100 mcg by mouth daily before breakfast.    Historical Provider, MD  LORazepam (ATIVAN) 1 MG tablet Take 1 tablet (1 mg total) by mouth 2 (two) times daily as needed for anxiety. 11/05/15   Lytle Butte, MD  naltrexone (DEPADE) 50 MG tablet Take 1 tablet (50 mg total) by mouth daily. 09/05/15   Hildred Priest, MD  predniSONE (DELTASONE) 10 MG tablet Take 10 mg by mouth See admin instructions. Taper 3,3,3,2,2,2,1,1,1    Historical Provider, MD  risperiDONE (RISPERDAL) 0.5 MG tablet Take 0.5 mg by mouth at bedtime.    Historical Provider, MD    Allergies Patient has no known allergies.  No family history on file.  Social History Social History  Substance Use Topics  . Smoking status: Never Smoker  . Smokeless tobacco: Never Used  . Alcohol use Yes     Comment: daily- 1 pint of vodka    Review of Systems Constitutional: No fever/chills Eyes: No visual changes. ENT: No sore throat. Cardiovascular: Denies chest pain. Respiratory: Denies shortness of breath. Gastrointestinal: No abdominal pain.  No  nausea, no vomiting.  No diarrhea.  No constipation. Genitourinary: Negative for dysuria. Musculoskeletal: Negative for back pain. Skin: Negative for rash. Neurological: Negative for headaches, focal weakness or numbness. Psych: Depression  10-point ROS otherwise negative.  ____________________________________________   PHYSICAL EXAM:  VITAL SIGNS: ED Triage Vitals  Enc Vitals Group     BP 08/27/16 2229 (!) 149/110     Pulse Rate 08/27/16 2229 (!)  136     Resp 08/27/16 2229 16     Temp 08/27/16 2229 98.5 F (36.9 C)     Temp Source 08/27/16 2229 Oral     SpO2 08/27/16 2229 97 %     Weight 08/27/16 2225 154 lb (69.9 kg)     Height 08/27/16 2225 5\' 3"  (1.6 m)     Head Circumference --      Peak Flow --      Pain Score 08/27/16 2225 0     Pain Loc --      Pain Edu? --      Excl. in [place]? --     Constitutional: Alert and oriented. Well appearing and in Mild distress. Eyes: Conjunctivae are normal. PERRL. EOMI. Head: Atraumatic. Nose: No congestion/rhinnorhea. Mouth/Throat: Mucous membranes are moist.  Oropharynx non-erythematous. Cardiovascular: Tachycardia, regular rhythm. Grossly normal heart sounds.  Good peripheral circulation. Respiratory: Normal respiratory effort.  No retractions. Lungs CTAB. Gastrointestinal: Soft and nontender. No distention. Positive bowel sounds Musculoskeletal: No lower extremity tenderness nor edema.   Neurologic:  Normal speech and language. Skin:  Skin is warm, dry and intact.  Psychiatric: Patient is agitated but does answer questions appropriately.  ____________________________________________   LABS (all labs ordered are listed, but only abnormal results are displayed)  Labs Reviewed  COMPREHENSIVE METABOLIC PANEL - Abnormal; Notable for the following:       Result Value   Glucose, Bld 119 (*)    Total Protein 9.0 (*)    All other components within normal limits  ETHANOL - Abnormal; Notable for the following:    Alcohol, Ethyl (B) 331 (*)    All other components within normal limits  URINE DRUG SCREEN, QUALITATIVE ([facility] ONLY) - Abnormal; Notable for the following:    Benzodiazepine, Ur Scrn POSITIVE (*)    All other components within normal limits  CBC   ____________________________________________  EKG  none ____________________________________________  RADIOLOGY  none ____________________________________________   PROCEDURES  Procedure(s) performed:  None  Procedures  Critical Care performed: No  ____________________________________________   INITIAL IMPRESSION / ASSESSMENT AND PLAN / ED COURSE  Pertinent labs & imaging results that were available during my care of the patient were reviewed by me and considered in my medical decision making (see chart for details).  This is a 51 year old male who comes into the hospital today with alcohol intoxication and suicidal ideation. The patient is seen here frequently but according to his IVC paperwork he was having suicidal thoughts. The patient's alcohol level is in the 300s. We will await the patient to sober and we will have him evaluated by TTS as well as by psych.      ____________________________________________   FINAL CLINICAL IMPRESSION(S) / ED DIAGNOSES  Final diagnoses:  Alcoholic intoxication without complication ([place])  Suicidal ideation      NEW MEDICATIONS STARTED DURING THIS VISIT:  New Prescriptions   No medications on file     Note:  This document was prepared using Dragon voice recognition software and may include unintentional dictation errors.    Loney Hering,  MD 08/28/16 [id number]

## 2016-08-28 NOTE — ED Provider Notes (Signed)
Cleared for discharge by Dr. Weber Cooks, patient is clinically sober   Ivan Drafts, MD 08/28/16 1416

## 2016-08-28 NOTE — ED Notes (Signed)
Pt given breakfast tray and water 

## 2016-08-28 NOTE — ED Notes (Signed)
Pt observed lying in bed   Pt visualized with NAD  No verbalized needs or concerns at this time  Continue to monitor 

## 2016-08-28 NOTE — ED Notes (Signed)
ED  Is the patient under IVC or is there intent for IVC: Yes.   Is the patient medically cleared: Yes.   Is there vacancy in the ED BHU: Yes.   Is the population mix appropriate for patient: Yes.   Is the patient awaiting placement in inpatient or outpatient setting: Yes.   Has the patient had a psychiatric consult:  Consult pending  Survey of unit performed for contraband, proper placement and condition of furniture, tampering with fixtures in bathroom, shower, and each patient room: Yes.  ; Findings:  APPEARANCE/BEHAVIOR Calm and cooperative NEURO ASSESSMENT Orientation: oriented x3  Denies pain Hallucinations: No.None noted (Hallucinations) Speech: Normal Gait: normal RESPIRATORY ASSESSMENT Even  Unlabored respirations  CARDIOVASCULAR ASSESSMENT Pulses equal   regular rate  Skin warm and dry   GASTROINTESTINAL ASSESSMENT no GI complaint EXTREMITIES Full ROM  PLAN OF CARE Provide calm/safe environment. Vital signs assessed twice daily. ED BHU Assessment once each 12-hour shift. Collaborate with daily or as condition indicates. Assure the ED provider has rounded once each shift. Provide and encourage hygiene. Provide redirection as needed. Assess for escalating behavior; address immediately and inform ED provider.  Assess family dynamic and appropriateness for visitation as needed: Yes.  ; If necessary, describe findings:  Educate the patient/family about BHU procedures/visitation: Yes.  ; If necessary, describe findings:

## 2017-01-18 ENCOUNTER — Encounter: Payer: Self-pay | Admitting: Emergency Medicine

## 2017-01-18 ENCOUNTER — Emergency Department
Admission: EM | Admit: 2017-01-18 | Discharge: 2017-01-18 | Disposition: A | Discharge status: Home / Self Care | Payer: Medicaid Other | Attending: Emergency Medicine | Admitting: Emergency Medicine

## 2017-01-18 DIAGNOSIS — F10920 Alcohol use, unspecified with intoxication, uncomplicated: Secondary | ICD-10-CM

## 2017-01-18 DIAGNOSIS — R45851 Suicidal ideations: Secondary | ICD-10-CM | POA: Insufficient documentation

## 2017-01-18 DIAGNOSIS — F10120 Alcohol abuse with intoxication, uncomplicated: Secondary | ICD-10-CM | POA: Diagnosis present

## 2017-01-18 DIAGNOSIS — Z9114 Patient's other noncompliance with medication regimen: Secondary | ICD-10-CM | POA: Insufficient documentation

## 2017-01-18 DIAGNOSIS — E039 Hypothyroidism, unspecified: Secondary | ICD-10-CM | POA: Insufficient documentation

## 2017-01-18 DIAGNOSIS — F1094 Alcohol use, unspecified with alcohol-induced mood disorder: Secondary | ICD-10-CM

## 2017-01-18 LAB — COMPREHENSIVE METABOLIC PANEL
ALT: 66 U/L — ABNORMAL HIGH (ref 17–63)
AST: 108 U/L — ABNORMAL HIGH (ref 15–41)
Albumin: 5 g/dL (ref 3.5–5.0)
Alkaline Phosphatase: 61 U/L (ref 38–126)
Anion gap: 11 (ref 5–15)
BUN: 9 mg/dL (ref 6–20)
CO2: 23 mmol/L (ref 22–32)
Calcium: 8.9 mg/dL (ref 8.9–10.3)
Chloride: 106 mmol/L (ref 101–111)
Creatinine, Ser: 0.74 mg/dL (ref 0.61–1.24)
GFR calc Af Amer: >60 mL/min (ref >60)
GFR calc non Af Amer: >60 mL/min (ref >60)
Glucose, Bld: 119 mg/dL — ABNORMAL HIGH (ref 65–99)
Potassium: 3.8 mmol/L (ref 3.5–5.1)
Sodium: 140 mmol/L (ref 135–145)
Total Bilirubin: 1.2 mg/dL (ref 0.3–1.2)
Total Protein: 9 g/dL — ABNORMAL HIGH (ref 6.5–8.1)

## 2017-01-18 LAB — CBC
HCT: 43.6 % (ref 40.0–52.0)
Hemoglobin: 14.9 g/dL (ref 13.0–18.0)
MCH: 32.1 pg (ref 26.0–34.0)
MCHC: 34.2 g/dL (ref 32.0–36.0)
MCV: 93.8 fL (ref 80.0–100.0)
Platelets: 250 10*3/uL (ref 150–440)
RBC: 4.65 MIL/uL (ref 4.40–5.90)
RDW: 15.2 % — ABNORMAL HIGH (ref 11.5–14.5)
WBC: 7.1 10*3/uL (ref 3.8–10.6)

## 2017-01-18 LAB — SALICYLATE LEVEL: Salicylate Lvl: <7 mg/dL (ref 2.8–30.0)

## 2017-01-18 LAB — URINE DRUG SCREEN, QUALITATIVE (ARMC ONLY)
Amphetamines, Ur Screen: NOT DETECTED
Barbiturates, Ur Screen: NOT DETECTED
Benzodiazepine, Ur Scrn: NOT DETECTED
Cannabinoid 50 Ng, Ur ~~LOC~~: NOT DETECTED
Cocaine Metabolite,Ur ~~LOC~~: NOT DETECTED
MDMA (Ecstasy)Ur Screen: NOT DETECTED
Methadone Scn, Ur: NOT DETECTED
Opiate, Ur Screen: NOT DETECTED
Phencyclidine (PCP) Ur S: NOT DETECTED
Tricyclic, Ur Screen: NOT DETECTED

## 2017-01-18 LAB — ETHANOL: Alcohol, Ethyl (B): 320 mg/dL (ref <5)

## 2017-01-18 LAB — ACETAMINOPHEN LEVEL: Acetaminophen (Tylenol), Serum: <10 ug/mL — ABNORMAL LOW (ref 10–30)

## 2017-01-18 MED ORDER — VITAMIN B-1 100 MG PO TABS
100.0000 mg | ORAL_TABLET | Freq: Every day | ORAL | Status: DC
  Administered 2017-01-18: 100 mg via ORAL
  Filled 2017-01-18: qty 1

## 2017-01-18 MED ORDER — LORAZEPAM 2 MG PO TABS
0.0000 mg | ORAL_TABLET | Freq: Four times a day (QID) | ORAL | Status: DC
  Administered 2017-01-18: 1 mg via ORAL

## 2017-01-18 MED ORDER — LORAZEPAM 1 MG PO TABS
ORAL_TABLET | ORAL | Status: AC
  Filled 2017-01-18: qty 1

## 2017-01-18 MED ORDER — LORAZEPAM 2 MG/ML IJ SOLN: 0.0000 mg | Freq: Four times a day (QID) | INTRAMUSCULAR | Status: DC

## 2017-01-18 MED ORDER — THIAMINE HCL 100 MG/ML IJ SOLN: 100.0000 mg | Freq: Every day | INTRAMUSCULAR | Status: DC

## 2017-01-18 MED ORDER — CHLORDIAZEPOXIDE HCL 25 MG PO CAPS: 25.0000 mg | ORAL_CAPSULE | Freq: Three times a day (TID) | ORAL | 0 refills | Status: DC | PRN

## 2017-01-18 MED ORDER — LORAZEPAM 2 MG/ML IJ SOLN
0.0000 mg | Freq: Two times a day (BID) | INTRAMUSCULAR | Status: DC
Start: 2017-01-20 — End: 2017-01-18

## 2017-01-18 MED ORDER — LORAZEPAM 2 MG PO TABS: 0.0000 mg | ORAL_TABLET | Freq: Two times a day (BID) | ORAL | Status: DC

## 2017-01-18 NOTE — ED Notes (Signed)
BEHAVIORAL HEALTH ROUNDING Patient sleeping: No. Patient alert and oriented: yes Behavior appropriate: Yes.  ; If no, describe:  Nutrition and fluids offered: yes Toileting and hygiene offered: Yes  Sitter present: q15 minute observations and security  monitoring Law enforcement present: Yes  ODS  

## 2017-01-18 NOTE — ED Triage Notes (Signed)
Pt brought in by [employer] under IVC. Per [employer] they were called out because pts family stated that pt was attempting to harm himself with a knife.  Pt states that he wanted to go outside and mow the yard and his family wanted him to stay inside so they got into a disagreement. Pt denies wanting to hurt himself. Pt admits to drinking ETOH today,states that he has taken 3 shots.

## 2017-01-18 NOTE — ED Notes (Signed)
Rescinded by SOC  

## 2017-01-18 NOTE — ED Provider Notes (Signed)
 -----------------------------------------   4:44 PM on 01/18/2017 -----------------------------------------  Patient well appearing, no acute distress, unremarkable vitals. Medically stable at this time although at risk for withdrawal. Psychiatry consult recommendations obtained, patient is not an imminent danger to himself or others he has medical decision-making capacity, and elects to pursue outpatient treatment for his alcohol abuse and dependence. Psychiatrist rescinded IVC. His note is thorough and addresses the concerns the daughter had, predominantly his tendency to make gestures with a knife when drunk and upset. Has a history of cutting to relieve stress. Had a similar episode about 4 months ago. Patient interested in Alcoholics Anonymous and has been given resources for Jorge Ny, MD 01/18/17 [phone]

## 2017-01-18 NOTE — ED Notes (Signed)
Called [facility] for consult  Spoke to [place]

## 2017-01-18 NOTE — ED Notes (Signed)
BEHAVIORAL HEALTH ROUNDING Patient sleeping: Yes.   Patient alert and oriented: eyes closed  Appears to be asleep Behavior appropriate: Yes.  ; If no, describe:  Nutrition and fluids offered: Yes  Toileting and hygiene offered: sleeping Sitter present: q 15 minute observations and security monitoring Law enforcement present: yes  ODS 

## 2017-01-18 NOTE — ED Notes (Signed)
BEHAVIORAL HEALTH ROUNDING Patient sleeping: No. Patient alert and oriented: yes Behavior appropriate: Yes.  ; If no, describe:  Nutrition and fluids offered: yes Toileting and hygiene offered: Yes  Sitter present: q15 minute observations and security monitoring Law enforcement present: Yes  ODS  SOC camera set up in his room

## 2017-01-18 NOTE — ED Provider Notes (Signed)
[facility] Emergency Department Provider Note  ____________________________________________   First MD Initiated Contact with Patient 01/18/17 1233     (approximate)  I have reviewed the triage vital signs and the nursing notes.   HISTORY  Chief Complaint IVC   HPI Ivan Hamilton is a 51 y.o. male with a history of alcohol withdrawal-induced depressive disorder who is presenting to the emergency department today with suicidal ideation. He is brought in by police with involuntary committed papers. The patient denies any suicidal ideation at home and says that he was going to use a knife to do some yard work. However, please say that the patient was in argument with his family when he threatened to kill himself with a knife. He reportedly has 5 previous suicide attempts and has been drinking since yesterday and has a history of alcoholism. Please also report the patient has been noncompliant with any of his medications over the past 6 months.   Past Medical History:  Diagnosis Date  . Alcohol withdrawal ([place])   . B12 deficiency   . Chronic pain   . Hypothyroidism   . Varicose veins     Patient Active Problem List   Diagnosis Date Noted  . Alcohol withdrawal delirium ([place])   . Alcohol withdrawal ([place]) 09/02/2015  . Alcohol use disorder, severe, dependence ([place]) 09/02/2015  . 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[place]) 09/02/2015  . PTSD (post-traumatic stress disorder) 09/01/2015  . Hypothyroid 09/01/2015  . Gastric reflux 09/01/2015  . DDD (degenerative disc disease), lumbosacral 09/01/2015    Past Surgical History:  Procedure Laterality Date  . LEG SURGERY      Prior to Admission medications   Medication Sig Start Date End Date Taking? Authorizing Provider  FLUoxetine (PROZAC) 20 MG capsule Take 1 capsule (20 mg total) by mouth daily. 09/05/15   Hildred Priest, MD    gabapentin (NEURONTIN) 400 MG capsule Take 1 capsule (400 mg total) by mouth 3 (three) times daily. 09/05/15   Hildred Priest, MD  levothyroxine (SYNTHROID, LEVOTHROID) 100 MCG tablet Take 100 mcg by mouth daily before breakfast.    [provider]  LORazepam (ATIVAN) 1 MG tablet Take 1 tablet (1 mg total) by mouth 2 (two) times daily as needed for anxiety. Patient not taking: Reported on 08/28/2016 11/05/15   Hower, Aaron Mose, MD  naltrexone (DEPADE) 50 MG tablet Take 1 tablet (50 mg total) by mouth daily. 09/05/15   Hildred Priest, MD    Allergies Patient has no known allergies.  No family history on file.  Social History Social History  Substance Use Topics  . Smoking status: Never Smoker  . Smokeless tobacco: Never Used  . Alcohol use Yes     Comment: daily- 1 pint of vodka    Review of Systems  Constitutional: No fever/chills Eyes: No visual changes. ENT: No sore throat. Cardiovascular: Denies chest pain. Respiratory: Denies shortness of breath. Gastrointestinal: No abdominal pain.  No nausea, no vomiting.  No diarrhea.  No constipation. Genitourinary: Negative for dysuria. Musculoskeletal: Negative for back pain. Skin: Negative for rash. Neurological: Negative for headaches, focal weakness or numbness.   ____________________________________________   PHYSICAL EXAM:  VITAL SIGNS: ED Triage Vitals  Enc Vitals Group     BP 01/18/17 1203 (!) 136/96     Pulse Rate 01/18/17 1203 (!) 104     Resp 01/18/17 1203 16     Temp 01/18/17 1203 98.6 F (37 C)  Temp src --      SpO2 01/18/17 1203 95 %     Weight --      Height --      Head Circumference --      Peak Flow --      Pain Score 01/18/17 1159 8     Pain Loc --      Pain Edu? --      Excl. in [place]? --     Constitutional: Alert and oriented. Well appearing and in no acute distress. Eyes: Conjunctivae are normal.  Head: Atraumatic. Nose: No congestion/rhinnorhea. Mouth/Throat:  Mucous membranes are moist.  Neck: No stridor.   Cardiovascular: Normal rate, regular rhythm. Grossly normal heart sounds.   Respiratory: Normal respiratory effort.  No retractions. Lungs CTAB. Gastrointestinal: Soft and nontender. No distention.  Musculoskeletal: No lower extremity tenderness nor edema.  No joint effusions. Neurologic:  Normal speech and language. No gross focal neurologic deficits are appreciated. Skin:  Skin is warm, dry and intact. No rash noted. Psychiatric: Mood and affect are normal. Speech and behavior are normal.  ____________________________________________   LABS (all labs ordered are listed, but only abnormal results are displayed)  Labs Reviewed  COMPREHENSIVE METABOLIC PANEL - Abnormal; Notable for the following:       Result Value   Glucose, Bld 119 (*)    Total Protein 9.0 (*)    AST 108 (*)    ALT 66 (*)    All other components within normal limits  ETHANOL - Abnormal; Notable for the following:    Alcohol, Ethyl (B) 320 (*)    All other components within normal limits  ACETAMINOPHEN LEVEL - Abnormal; Notable for the following:    Acetaminophen (Tylenol), Serum <10 (*)    All other components within normal limits  CBC - Abnormal; Notable for the following:    RDW 15.2 (*)    All other components within normal limits  SALICYLATE LEVEL  URINE DRUG SCREEN, QUALITATIVE ([facility] ONLY)   ____________________________________________  EKG   ____________________________________________  RADIOLOGY   ____________________________________________   PROCEDURES  Procedure(s) performed:   Procedures  Critical Care performed:   ____________________________________________   INITIAL IMPRESSION / ASSESSMENT AND PLAN / ED COURSE  Pertinent labs & imaging results that were available during my care of the patient were reviewed by me and considered in my medical decision making (see chart for details).  Patient's involuntary treatment will be  upheld. Pending psychiatry consultation. Placed on CIWA protocol.      ____________________________________________   FINAL CLINICAL IMPRESSION(S) / ED DIAGNOSES  Alcohol intoxication. Suicidal ideation.    NEW MEDICATIONS STARTED DURING THIS VISIT:  New Prescriptions   No medications on file     Note:  This document was prepared using Dragon voice recognition software and may include unintentional dictation errors.     Orbie Pyo, MD 01/18/17 [phone]

## 2017-01-18 NOTE — ED Notes (Signed)

## 2017-09-01 ENCOUNTER — Emergency Department
Admission: EM | Admit: 2017-09-01 | Discharge: 2017-09-01 | Disposition: A | Discharge status: Home / Self Care | Payer: Self-pay | Attending: Emergency Medicine | Admitting: Emergency Medicine

## 2017-09-01 ENCOUNTER — Encounter: Payer: Self-pay | Admitting: *Deleted

## 2017-09-01 ENCOUNTER — Other Ambulatory Visit: Payer: Self-pay

## 2017-09-01 ENCOUNTER — Encounter: Payer: Self-pay | Admitting: Emergency Medicine

## 2017-09-01 DIAGNOSIS — I83812 Varicose veins of left lower extremities with pain: Secondary | ICD-10-CM | POA: Insufficient documentation

## 2017-09-01 DIAGNOSIS — M79605 Pain in left leg: Secondary | ICD-10-CM

## 2017-09-01 DIAGNOSIS — M79662 Pain in left lower leg: Secondary | ICD-10-CM | POA: Insufficient documentation

## 2017-09-01 DIAGNOSIS — E039 Hypothyroidism, unspecified: Secondary | ICD-10-CM | POA: Insufficient documentation

## 2017-09-01 DIAGNOSIS — Z79899 Other long term (current) drug therapy: Secondary | ICD-10-CM | POA: Insufficient documentation

## 2017-09-01 NOTE — ED Triage Notes (Signed)
PT was seen in ED for left leg pain and discharged. Pt came to lobby and verbalized to first nurse that he could not find his car. Pt came to ED via EMS and verbalized he thought EMS would have brought his car. [employer] called family and family updated staff that pt has a hx of alcohol abuse and PTSD that he needs to be seen for. RN asked patient and pt confirmed he wants to go to rehab for alcohol abuse but also states that the leg pain is so bad that he feels like he needs the alcohol to numb the pain. No other drug use. 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 Pt repeats multiple times that something is wrong with his head. Pt denies SI and HI. Pt reports he is seeing people that are not there and hearing voices but reports he has been dealing with hallucinations for the past 30 years.   Last drink was this morning.

## 2017-09-01 NOTE — Discharge Instructions (Signed)
Please seek medical attention for any high fevers, chest pain, shortness of breath, change in behavior, persistent vomiting, bloody stool or any other new or concerning symptoms.  

## 2017-09-01 NOTE — ED Notes (Signed)
Pt is discharged and a cab was called with voucher provided. Awaiting cab and still in 19h until they arrive.

## 2017-09-01 NOTE — ED Provider Notes (Signed)
[facility] Emergency Department Provider Note   ____________________________________________   I have reviewed the triage vital signs and the nursing notes.   HISTORY  Chief Complaint Left leg pain  History limited by: Not Limited   HPI Ivan Hamilton is a 52 y.o. male who rechecks into the emergency department.  When asked why he rechecked into the emergency department after I just seen him he states because he is still having pain in his left leg.  Located in the same spot that was located in my previous assessment.  Still associated with some swelling.  Apparently he told the triage nurse that he was also been having hallucinations however this is been a chronic issue.    Per medical record review patient has a history of alcohol abuse.   Past Medical History:  Diagnosis Date  . Alcohol withdrawal ([place])   . B12 deficiency   . Chronic pain   . Hypothyroidism   . Varicose veins     Patient Active Problem List   Diagnosis Date Noted  . Alcohol withdrawal delirium ([place])   . Alcohol withdrawal ([place]) 09/02/2015  . Alcohol use disorder, severe, dependence ([place]) 09/02/2015  . 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[place]) 09/02/2015  . PTSD (post-traumatic stress disorder) 09/01/2015  . Hypothyroid 09/01/2015  . Gastric reflux 09/01/2015  . DDD (degenerative disc disease), lumbosacral 09/01/2015    Past Surgical History:  Procedure Laterality Date  . LEG SURGERY      Prior to Admission medications   Medication Sig Start Date End Date Taking? Authorizing Provider  chlordiazePOXIDE (LIBRIUM) 25 MG capsule Take 1 capsule (25 mg total) by mouth 3 (three) times daily as needed for anxiety or withdrawal. 01/18/17   Carrie Mew, MD  FLUoxetine (PROZAC) 20 MG capsule Take 1 capsule (20 mg total) by mouth daily. 09/05/15   Hildred Priest, MD  gabapentin (NEURONTIN) 400 MG  capsule Take 1 capsule (400 mg total) by mouth 3 (three) times daily. 09/05/15   Hildred Priest, MD  levothyroxine (SYNTHROID, LEVOTHROID) 100 MCG tablet Take 100 mcg by mouth daily before breakfast.    [provider]  LORazepam (ATIVAN) 1 MG tablet Take 1 tablet (1 mg total) by mouth 2 (two) times daily as needed for anxiety. Patient not taking: Reported on 08/28/2016 11/05/15   Hower, Aaron Mose, MD  naltrexone (DEPADE) 50 MG tablet Take 1 tablet (50 mg total) by mouth daily. 09/05/15   Hildred Priest, MD    Allergies Patient has no known allergies.  History reviewed. No pertinent family history.  Social History Social History   Tobacco Use  . Smoking status: Never Smoker  . Smokeless tobacco: Never Used  Substance Use Topics  . Alcohol use: Yes    Comment: daily- 1 pint of vodka  . Drug use: No    Review of Systems Constitutional: No fever/chills Eyes: No visual changes. ENT: No sore throat. Cardiovascular: Denies chest pain. Respiratory: Denies shortness of breath. Gastrointestinal: No abdominal pain.  No nausea, no vomiting.  No diarrhea.   Genitourinary: Negative for dysuria. Musculoskeletal: Positive for left leg pain Skin: Negative for rash. Neurological: Negative for headaches, focal weakness or numbness.  ____________________________________________   PHYSICAL EXAM:  VITAL SIGNS: ED Triage Vitals  Enc Vitals Group     BP --      Pulse --      Resp --      Temp --  Temp src --      SpO2 --      Weight 09/01/17 1958 165 lb (74.8 kg)     Height 09/01/17 1958 5\' 4"  (1.626 m)     Head Circumference --      Peak Flow --      Pain Score 09/01/17 2034 0     Pain Loc --      Pain Edu? --      Excl. in [place]? --      Constitutional: Alert and oriented. Well appearing and in no distress. Eyes: Conjunctivae are normal.  ENT   Head: Normocephalic and atraumatic.   Nose: No congestion/rhinnorhea.   Mouth/Throat: Mucous  membranes are moist.   Neck: No stridor. Hematological/Lymphatic/Immunilogical: No cervical lymphadenopathy. Cardiovascular: Normal rate, regular rhythm.  No murmurs, rubs, or gallops.  Respiratory: Normal respiratory effort without tachypnea nor retractions. Breath sounds are clear and equal bilaterally. No wheezes/rales/rhonchi. Gastrointestinal: Soft and non tender. No rebound. No guarding.  Genitourinary: Deferred Musculoskeletal: Left leg exam consistent with earlier exam, small area of swelling overlying vein. Neurologic:  Normal speech and language. No gross focal neurologic deficits are appreciated.  Skin:  Skin is warm, dry and intact. No rash noted. Psychiatric: Mood and affect are normal. Speech and behavior are normal. Patient exhibits appropriate insight and judgment.  ____________________________________________    LABS (pertinent positives/negatives)  None  ____________________________________________   EKG  None  ____________________________________________    RADIOLOGY  None  ____________________________________________   PROCEDURES  Procedures  ____________________________________________   INITIAL IMPRESSION / ASSESSMENT AND PLAN / ED COURSE  Pertinent labs & imaging results that were available during my care of the patient were reviewed by me and considered in my medical decision making (see chart for details).  Patient rechecked into the emergency department he told me because he still having pain in his left leg.  I again discussed with the patient he needs to follow-up with his primary care doctor in the referrals that he is set up.  Patient do not feel any further emergent workup is necessary.   ____________________________________________   FINAL CLINICAL IMPRESSION(S) / ED DIAGNOSES  Final diagnoses:  Pain of left lower extremity     Note: This dictation was prepared with Dragon dictation. Any transcriptional errors that result  from this process are unintentional     Nance Pear, MD 09/01/17 2049

## 2017-09-01 NOTE — ED Triage Notes (Signed)
Pt has chronic leg pain and has referral to vascular surgery. Pt came today b/c he doesn't want to wait for his appt and would like [place] to do surgery tonight

## 2017-09-01 NOTE — ED Notes (Signed)
Pt who was seen earlier by this nurse and doctor, now states wants to stay overnight. States he hears voices since he came to [place] 20 years ago. Lives at home with family. Denies si/hi. Only complains of wanting [place] to fix his leg which is what he was seen earlier this evening. Pt had been discharged and was found trying to get into cars - he states was trying to find his car but was brought in earlier by ems. States has no way to get home and wants to stay overnight so we can fix his leg.
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
## 2017-09-17 ENCOUNTER — Telehealth (HOSPITAL_COMMUNITY): Payer: Self-pay | Admitting: Psychiatry

## 2017-09-17 NOTE — Telephone Encounter (Signed)
D:  Lea at the [facility] referred patient to Probation officer.  A:  Placed call to pt ([phone]) and he is wanting to only see a psychiatrist for his ETOH and anxiety issues.  Will give information to the front office staff.  R:  Pt receptive.   Dellia Nims, M.Ed,CNA

## 2017-10-22 ENCOUNTER — Encounter: Payer: Self-pay | Admitting: Emergency Medicine

## 2017-10-22 ENCOUNTER — Inpatient Hospital Stay: Payer: Medicare Other

## 2017-10-22 ENCOUNTER — Inpatient Hospital Stay
Admission: EM | Admit: 2017-10-22 | Discharge: 2017-10-28 | DRG: 391 | Disposition: A | Discharge status: Home / Self Care | Payer: Medicare Other | Attending: Internal Medicine | Admitting: Internal Medicine

## 2017-10-22 ENCOUNTER — Other Ambulatory Visit: Payer: Self-pay

## 2017-10-22 DIAGNOSIS — I4581 Long QT syndrome: Secondary | ICD-10-CM | POA: Diagnosis present

## 2017-10-22 DIAGNOSIS — K2970 Gastritis, unspecified, without bleeding: Secondary | ICD-10-CM

## 2017-10-22 DIAGNOSIS — K2920 Alcoholic gastritis without bleeding: Secondary | ICD-10-CM | POA: Diagnosis present

## 2017-10-22 DIAGNOSIS — Z6820 Body mass index (BMI) 20.0-20.9, adult: Secondary | ICD-10-CM

## 2017-10-22 DIAGNOSIS — R1011 Right upper quadrant pain: Secondary | ICD-10-CM | POA: Diagnosis present

## 2017-10-22 DIAGNOSIS — Z79890 Hormone replacement therapy (postmenopausal): Secondary | ICD-10-CM

## 2017-10-22 DIAGNOSIS — K2980 Duodenitis without bleeding: Principal | ICD-10-CM | POA: Diagnosis present

## 2017-10-22 DIAGNOSIS — D509 Iron deficiency anemia, unspecified: Secondary | ICD-10-CM | POA: Diagnosis present

## 2017-10-22 DIAGNOSIS — W19XXXA Unspecified fall, initial encounter: Secondary | ICD-10-CM | POA: Diagnosis present

## 2017-10-22 DIAGNOSIS — R42 Dizziness and giddiness: Secondary | ICD-10-CM

## 2017-10-22 DIAGNOSIS — K2900 Acute gastritis without bleeding: Secondary | ICD-10-CM | POA: Diagnosis present

## 2017-10-22 DIAGNOSIS — E43 Unspecified severe protein-calorie malnutrition: Secondary | ICD-10-CM | POA: Diagnosis present

## 2017-10-22 DIAGNOSIS — I959 Hypotension, unspecified: Secondary | ICD-10-CM | POA: Diagnosis present

## 2017-10-22 DIAGNOSIS — F10239 Alcohol dependence with withdrawal, unspecified: Secondary | ICD-10-CM | POA: Diagnosis present

## 2017-10-22 DIAGNOSIS — R109 Unspecified abdominal pain: Secondary | ICD-10-CM

## 2017-10-22 DIAGNOSIS — I639 Cerebral infarction, unspecified: Secondary | ICD-10-CM | POA: Diagnosis present

## 2017-10-22 DIAGNOSIS — I6381 Other cerebral infarction due to occlusion or stenosis of small artery: Secondary | ICD-10-CM | POA: Diagnosis not present

## 2017-10-22 DIAGNOSIS — K828 Other specified diseases of gallbladder: Secondary | ICD-10-CM | POA: Diagnosis present

## 2017-10-22 DIAGNOSIS — F1020 Alcohol dependence, uncomplicated: Secondary | ICD-10-CM | POA: Diagnosis present

## 2017-10-22 DIAGNOSIS — E039 Hypothyroidism, unspecified: Secondary | ICD-10-CM | POA: Diagnosis present

## 2017-10-22 DIAGNOSIS — F10939 Alcohol use, unspecified with withdrawal, unspecified: Secondary | ICD-10-CM | POA: Diagnosis present

## 2017-10-22 DIAGNOSIS — F4312 Post-traumatic stress disorder, chronic: Secondary | ICD-10-CM | POA: Diagnosis present

## 2017-10-22 DIAGNOSIS — Z800 Family history of malignant neoplasm of digestive organs: Secondary | ICD-10-CM | POA: Diagnosis not present

## 2017-10-22 DIAGNOSIS — E785 Hyperlipidemia, unspecified: Secondary | ICD-10-CM | POA: Diagnosis present

## 2017-10-22 DIAGNOSIS — R55 Syncope and collapse: Secondary | ICD-10-CM | POA: Diagnosis present

## 2017-10-22 DIAGNOSIS — E860 Dehydration: Secondary | ICD-10-CM | POA: Diagnosis present

## 2017-10-22 DIAGNOSIS — F4310 Post-traumatic stress disorder, unspecified: Secondary | ICD-10-CM | POA: Diagnosis present

## 2017-10-22 DIAGNOSIS — R112 Nausea with vomiting, unspecified: Secondary | ICD-10-CM | POA: Diagnosis not present

## 2017-10-22 DIAGNOSIS — E119 Type 2 diabetes mellitus without complications: Secondary | ICD-10-CM | POA: Diagnosis present

## 2017-10-22 DIAGNOSIS — Z79899 Other long term (current) drug therapy: Secondary | ICD-10-CM

## 2017-10-22 DIAGNOSIS — I5030 Unspecified diastolic (congestive) heart failure: Secondary | ICD-10-CM | POA: Diagnosis not present

## 2017-10-22 DIAGNOSIS — F329 Major depressive disorder, single episode, unspecified: Secondary | ICD-10-CM | POA: Diagnosis present

## 2017-10-22 DIAGNOSIS — E876 Hypokalemia: Secondary | ICD-10-CM | POA: Diagnosis present

## 2017-10-22 LAB — URINALYSIS, ROUTINE W REFLEX MICROSCOPIC
Bacteria, UA: NONE SEEN
Bilirubin Urine: NEGATIVE
Glucose, UA: NEGATIVE mg/dL
Ketones, ur: 5 mg/dL — AB
Leukocytes, UA: NEGATIVE
Nitrite: NEGATIVE
Protein, ur: NEGATIVE mg/dL
Specific Gravity, Urine: 1.009 (ref 1.005–1.030)
Squamous Epithelial / LPF: NONE SEEN
pH: 7 (ref 5.0–8.0)

## 2017-10-22 LAB — CBC
HCT: 34 % — ABNORMAL LOW (ref 40.0–52.0)
Hemoglobin: 11.4 g/dL — ABNORMAL LOW (ref 13.0–18.0)
MCH: 32.7 pg (ref 26.0–34.0)
MCHC: 33.6 g/dL (ref 32.0–36.0)
MCV: 97.4 fL (ref 80.0–100.0)
Platelets: 182 10*3/uL (ref 150–440)
RBC: 3.49 MIL/uL — ABNORMAL LOW (ref 4.40–5.90)
RDW: 13.9 % (ref 11.5–14.5)
WBC: 8.2 10*3/uL (ref 3.8–10.6)

## 2017-10-22 LAB — COMPREHENSIVE METABOLIC PANEL
ALT: 24 U/L (ref 17–63)
AST: 87 U/L — ABNORMAL HIGH (ref 15–41)
Albumin: 3.6 g/dL (ref 3.5–5.0)
Alkaline Phosphatase: 93 U/L (ref 38–126)
Anion gap: 19 — ABNORMAL HIGH (ref 5–15)
BUN: 8 mg/dL (ref 6–20)
CO2: 29 mmol/L (ref 22–32)
Calcium: 8.5 mg/dL — ABNORMAL LOW (ref 8.9–10.3)
Chloride: 87 mmol/L — ABNORMAL LOW (ref 101–111)
Creatinine, Ser: 0.85 mg/dL (ref 0.61–1.24)
GFR calc Af Amer: >60 mL/min (ref >60)
GFR calc non Af Amer: >60 mL/min (ref >60)
Glucose, Bld: 153 mg/dL — ABNORMAL HIGH (ref 65–99)
Potassium: 2.5 mmol/L — CL (ref 3.5–5.1)
Sodium: 135 mmol/L (ref 135–145)
Total Bilirubin: 2.4 mg/dL — ABNORMAL HIGH (ref 0.3–1.2)
Total Protein: 7.3 g/dL (ref 6.5–8.1)

## 2017-10-22 LAB — LIPASE, BLOOD: Lipase: 31 U/L (ref 11–51)

## 2017-10-22 LAB — URINALYSIS, MICROSCOPIC (REFLEX)
Bacteria, UA: NONE SEEN
Squamous Epithelial / LPF: NONE SEEN

## 2017-10-22 MED ORDER — POTASSIUM CHLORIDE IN NACL 40-0.9 MEQ/L-% IV SOLN
INTRAVENOUS | Status: DC
  Administered 2017-10-22 – 2017-10-25 (×4): 125 mL/h via INTRAVENOUS
  Filled 2017-10-22 (×11): qty 1000

## 2017-10-22 MED ORDER — ACETAMINOPHEN 650 MG RE SUPP: 650.0000 mg | Freq: Four times a day (QID) | RECTAL | Status: DC | PRN

## 2017-10-22 MED ORDER — VITAMIN B-1 100 MG PO TABS
100.0000 mg | ORAL_TABLET | Freq: Every day | ORAL | Status: DC
  Administered 2017-10-23 – 2017-10-28 (×5): 100 mg via ORAL
  Filled 2017-10-22 (×6): qty 1

## 2017-10-22 MED ORDER — LORAZEPAM 1 MG PO TABS
1.0000 mg | ORAL_TABLET | Freq: Four times a day (QID) | ORAL | Status: AC | PRN
  Administered 2017-10-22 – 2017-10-25 (×6): 1 mg via ORAL
  Filled 2017-10-22 (×7): qty 1

## 2017-10-22 MED ORDER — HYDROMORPHONE HCL 1 MG/ML IJ SOLN
0.5000 mg | INTRAMUSCULAR | Status: DC | PRN
  Administered 2017-10-22 – 2017-10-25 (×2): 0.5 mg via INTRAVENOUS
  Filled 2017-10-22 (×3): qty 1

## 2017-10-22 MED ORDER — ONDANSETRON HCL 4 MG/2ML IJ SOLN
4.0000 mg | Freq: Four times a day (QID) | INTRAMUSCULAR | Status: DC | PRN
  Administered 2017-10-22: 4 mg via INTRAVENOUS
  Filled 2017-10-22: qty 2

## 2017-10-22 MED ORDER — SENNOSIDES-DOCUSATE SODIUM 8.6-50 MG PO TABS
1.0000 | ORAL_TABLET | Freq: Every evening | ORAL | Status: DC | PRN
  Administered 2017-10-22: 1 via ORAL
  Filled 2017-10-22: qty 1

## 2017-10-22 MED ORDER — PANTOPRAZOLE SODIUM 40 MG PO TBEC
40.0000 mg | DELAYED_RELEASE_TABLET | Freq: Every day | ORAL | Status: DC
  Administered 2017-10-22 – 2017-10-23 (×2): 40 mg via ORAL
  Filled 2017-10-22 (×2): qty 1

## 2017-10-22 MED ORDER — ONDANSETRON HCL 4 MG PO TABS: 4.0000 mg | ORAL_TABLET | Freq: Four times a day (QID) | ORAL | Status: DC | PRN

## 2017-10-22 MED ORDER — ONDANSETRON HCL 4 MG/2ML IJ SOLN
4.0000 mg | Freq: Once | INTRAMUSCULAR | Status: AC
Start: 2017-10-22 — End: 2017-10-22
  Administered 2017-10-22: 4 mg via INTRAVENOUS
  Filled 2017-10-22: qty 2

## 2017-10-22 MED ORDER — SODIUM CHLORIDE 0.9 % IV SOLN
1000.0000 mL | Freq: Once | INTRAVENOUS | Status: AC
  Administered 2017-10-22: 1000 mL via INTRAVENOUS

## 2017-10-22 MED ORDER — ADULT MULTIVITAMIN W/MINERALS CH
1.0000 | ORAL_TABLET | Freq: Every day | ORAL | Status: DC
  Administered 2017-10-22 – 2017-10-28 (×6): 1 via ORAL
  Filled 2017-10-22 (×7): qty 1

## 2017-10-22 MED ORDER — LEVOTHYROXINE SODIUM 100 MCG PO TABS
100.0000 ug | ORAL_TABLET | Freq: Every day | ORAL | Status: DC
  Administered 2017-10-23 – 2017-10-28 (×5): 100 ug via ORAL
  Filled 2017-10-22: qty 1
  Filled 2017-10-22 (×2): qty 2
  Filled 2017-10-22 (×3): qty 1

## 2017-10-22 MED ORDER — BUSPIRONE HCL 15 MG PO TABS
15.0000 mg | ORAL_TABLET | Freq: Two times a day (BID) | ORAL | Status: DC
  Administered 2017-10-22 – 2017-10-28 (×11): 15 mg via ORAL
  Filled 2017-10-22: qty 3
  Filled 2017-10-22: qty 1
  Filled 2017-10-22 (×2): qty 3
  Filled 2017-10-22: qty 1
  Filled 2017-10-22 (×2): qty 3
  Filled 2017-10-22 (×5): qty 1
  Filled 2017-10-22: qty 3
  Filled 2017-10-22 (×2): qty 1

## 2017-10-22 MED ORDER — ACETAMINOPHEN 325 MG PO TABS
650.0000 mg | ORAL_TABLET | Freq: Four times a day (QID) | ORAL | Status: DC | PRN
  Administered 2017-10-24 – 2017-10-28 (×5): 650 mg via ORAL
  Filled 2017-10-22 (×7): qty 2

## 2017-10-22 MED ORDER — LORAZEPAM 2 MG/ML IJ SOLN
1.0000 mg | Freq: Four times a day (QID) | INTRAMUSCULAR | Status: AC | PRN
  Administered 2017-10-23 – 2017-10-24 (×2): 1 mg via INTRAVENOUS
  Filled 2017-10-22: qty 1

## 2017-10-22 MED ORDER — ENOXAPARIN SODIUM 40 MG/0.4ML ~~LOC~~ SOLN
40.0000 mg | Freq: Every day | SUBCUTANEOUS | Status: DC
  Administered 2017-10-22 – 2017-10-27 (×6): 40 mg via SUBCUTANEOUS
  Filled 2017-10-22 (×6): qty 0.4

## 2017-10-22 MED ORDER — THIAMINE HCL 100 MG/ML IJ SOLN
100.0000 mg | Freq: Every day | INTRAMUSCULAR | Status: DC
  Administered 2017-10-22 – 2017-10-26 (×2): 100 mg via INTRAVENOUS
  Filled 2017-10-22 (×2): qty 2

## 2017-10-22 MED ORDER — ONDANSETRON HCL 4 MG/2ML IJ SOLN
4.0000 mg | Freq: Once | INTRAMUSCULAR | Status: AC
  Administered 2017-10-22: 4 mg via INTRAVENOUS
  Filled 2017-10-22: qty 2

## 2017-10-22 MED ORDER — LORAZEPAM 2 MG/ML IJ SOLN
0.0000 mg | Freq: Four times a day (QID) | INTRAMUSCULAR | Status: DC
  Administered 2017-10-22: 1 mg via INTRAVENOUS
  Administered 2017-10-24: 2 mg via INTRAVENOUS
  Filled 2017-10-22 (×3): qty 1

## 2017-10-22 MED ORDER — FOLIC ACID 1 MG PO TABS
1.0000 mg | ORAL_TABLET | Freq: Every day | ORAL | Status: DC
  Administered 2017-10-22 – 2017-10-28 (×6): 1 mg via ORAL
  Filled 2017-10-22 (×7): qty 1

## 2017-10-22 MED ORDER — LORAZEPAM 2 MG/ML IJ SOLN
0.0000 mg | Freq: Two times a day (BID) | INTRAMUSCULAR | Status: DC
  Administered 2017-10-25 – 2017-10-26 (×2): 2 mg via INTRAVENOUS
  Filled 2017-10-22 (×2): qty 1

## 2017-10-22 NOTE — Care Management (Signed)
Multiple presentations to hospital. Per note patient has hx of alcohol abuse and PTSD. I do not see a recent social worker assessment of patient needs.  I have added social work consult to assist with patient's transition of care need/s.

## 2017-10-22 NOTE — ED Notes (Signed)
Date and time results received: 10/22/17  Test: potassium Critical Value:2.5  Name of Provider Notified:[place]  Orders Received? Or Actions Taken?: MD notified

## 2017-10-22 NOTE — H&P (Signed)
[place] [facility] [place] NAME: Ivan Hamilton    MR#:  [id number]  DATE OF BIRTH:  [birth year]  DATE OF ADMISSION:  10/22/2017  PRIMARY CARE PHYSICIAN: Tracie Harrier, MD   REQUESTING/REFERRING PHYSICIAN:   CHIEF COMPLAINT:   Chief Complaint  Patient presents with  . Weakness    HISTORY OF PRESENT ILLNESS: Ivan Hamilton  is a 52 y.o. male with a known history of varicose veins, hypothyroidism, B12 deficiency, alcohol abuse and alcohol withdrawal in the past presented to the emergency room with nausea vomiting.  Patient has intractable nausea and vomiting for the last couple of days.  Not able to tolerate any oral feeds.  He was having extreme weakness.  He also had dizzy spells for the last couple of days and was about to pass out the same.  Orthostatics were positive when EMS checked on the patient.  He also has some abdominal discomfort is aching in nature 5 out of 10 on a scale of 1-10.  Patient was evaluated in the emergency room his potassium level was low around 2.5.  Family members also say he has been losing weight and has difficulty swallowing food.  Has epigastric discomfort.  No vomiting of blood.  No coughing of blood and no complaints of any rectal bleed. Patient had a shot of tequila yesterday.  PAST MEDICAL HISTORY:   Past Medical History:  Diagnosis Date  . Alcohol withdrawal ([place])   . B12 deficiency   . Chronic pain   . Hypothyroidism   . Varicose veins     PAST SURGICAL HISTORY:  Past Surgical History:  Procedure Laterality Date  . LEG SURGERY      SOCIAL HISTORY:  Social History   Tobacco Use  . Smoking status: Never Smoker  . Smokeless tobacco: Never Used  Substance Use Topics  . Alcohol use: Yes    Comment: daily- 1 pint of vodka    FAMILY HISTORY: No family history on file.  DRUG ALLERGIES: No Known Allergies  REVIEW OF SYSTEMS:   CONSTITUTIONAL: No fever, fatigue or weakness.  EYES: No  blurred or double vision.  EARS, NOSE, AND THROAT: No tinnitus or ear pain.  RESPIRATORY: No cough, shortness of breath, wheezing or hemoptysis.  CARDIOVASCULAR: No chest pain, orthopnea, edema.  GASTROINTESTINAL: Has nausea, vomiting, abdominal pain.  No diarrhea GENITOURINARY: No dysuria, hematuria.  ENDOCRINE: No polyuria, nocturia,  HEMATOLOGY: No anemia, easy bruising or bleeding SKIN: No rash or lesion. MUSCULOSKELETAL: No joint pain or arthritis.   NEUROLOGIC: No tingling, numbness, weakness.  Had dizziness PSYCHIATRY: No anxiety or depression.   MEDICATIONS AT HOME:  Prior to Admission medications   Medication Sig Start Date End Date Taking? Authorizing Provider  busPIRone (BUSPAR) 15 MG tablet Take 15 mg by mouth 2 (two) times daily. 08/30/17  Yes [provider]  levothyroxine (SYNTHROID, LEVOTHROID) 100 MCG tablet Take 100 mcg by mouth daily before breakfast.   Yes [provider]  omeprazole (PRILOSEC) 20 MG capsule Take 20 mg by mouth daily.   Yes [provider]  chlordiazePOXIDE (LIBRIUM) 25 MG capsule Take 1 capsule (25 mg total) by mouth 3 (three) times daily as needed for anxiety or withdrawal. Patient not taking: Reported on 10/22/2017 01/18/17   Carrie Mew, MD  FLUoxetine (PROZAC) 20 MG capsule Take 1 capsule (20 mg total) by mouth daily. Patient not taking: Reported on 10/22/2017 09/05/15   Hildred Priest, MD  gabapentin (NEURONTIN) 400 MG capsule  Take 1 capsule (400 mg total) by mouth 3 (three) times daily. Patient not taking: Reported on 10/22/2017 09/05/15   Hildred Priest, MD  LORazepam (ATIVAN) 1 MG tablet Take 1 tablet (1 mg total) by mouth 2 (two) times daily as needed for anxiety. Patient not taking: Reported on 08/28/2016 11/05/15   Hower, Aaron Mose, MD  naltrexone (DEPADE) 50 MG tablet Take 1 tablet (50 mg total) by mouth daily. Patient not taking: Reported on 10/22/2017 09/05/15   Hildred Priest, MD       PHYSICAL EXAMINATION:   VITAL SIGNS: Blood pressure 104/77, pulse 86, temperature 98.1 F (36.7 C), temperature source Oral, resp. rate 16, height 5\' 5"  (1.651 m), weight 61.2 kg (135 lb), SpO2 96 %.  GENERAL:  52 y.o.-year-old patient lying in the bed with no acute distress.  EYES: Pupils equal, round, reactive to light and accommodation. No scleral icterus. Extraocular muscles intact.  HEENT: Head atraumatic, normocephalic. Oropharynx dry and nasopharynx clear.  NECK:  Supple, no jugular venous distention. No thyroid enlargement, no tenderness.  LUNGS: Normal breath sounds bilaterally, no wheezing, rales,rhonchi or crepitation. No use of accessory muscles of respiration.  CARDIOVASCULAR: S1, S2 normal. No murmurs, rubs, or gallops.  ABDOMEN: Soft, tenderness around umbilicus, nondistended. Bowel sounds present. No organomegaly or mass.  EXTREMITIES: No pedal edema, cyanosis, or clubbing.  NEUROLOGIC: Cranial nerves II through XII are intact. Muscle strength 5/5 in all extremities. Sensation intact. Gait not checked.  PSYCHIATRIC: The patient is alert and oriented x 3.  SKIN: No obvious rash, lesion, or ulcer.   LABORATORY PANEL:   CBC Recent Labs  Lab 10/22/17 1007  WBC 8.2  HGB 11.4*  HCT 34.0*  PLT 182  MCV 97.4  MCH 32.7  MCHC 33.6  RDW 13.9   ------------------------------------------------------------------------------------------------------------------  Chemistries  Recent Labs  Lab 10/22/17 1007  NA 135  K 2.5*  CL 87*  CO2 29  GLUCOSE 153*  BUN 8  CREATININE 0.85  CALCIUM 8.5*  AST 87*  ALT 24  ALKPHOS 93  BILITOT 2.4*   ------------------------------------------------------------------------------------------------------------------ estimated creatinine clearance is 87 mL/min (by C-G formula based on SCr of 0.85 mg/dL). ------------------------------------------------------------------------------------------------------------------ No results for  input(s): TSH, T4TOTAL, T3FREE, THYROIDAB in the last 72 hours.  Invalid input(s): FREET3   Coagulation profile No results for input(s): INR, PROTIME in the last 168 hours. ------------------------------------------------------------------------------------------------------------------- No results for input(s): DDIMER in the last 72 hours. -------------------------------------------------------------------------------------------------------------------  Cardiac Enzymes No results for input(s): CKMB, TROPONINI, MYOGLOBIN in the last 168 hours.  Invalid input(s): CK ------------------------------------------------------------------------------------------------------------------ Invalid input(s): POCBNP  ---------------------------------------------------------------------------------------------------------------  Urinalysis    Component Value Date/Time   COLORURINE Amber 08/11/2014 1030   APPEARANCEUR Cloudy 08/11/2014 1030   LABSPEC 1.027 08/11/2014 1030   PHURINE 7.0 08/11/2014 1030   GLUCOSEU >=500 08/11/2014 1030   HGBUR Negative 08/11/2014 1030   BILIRUBINUR 1+ 08/11/2014 1030   KETONESUR Trace 08/11/2014 1030   PROTEINUR 100 mg/dL 08/11/2014 1030   NITRITE Negative 08/11/2014 1030   LEUKOCYTESUR Negative 08/11/2014 1030     RADIOLOGY: No results found.  EKG: Orders placed or performed during the hospital encounter of 10/22/17  . EKG 12-Lead  . EKG 12-Lead    IMPRESSION AND PLAN:  52 year old male patient with history of alcohol abuse, hypothyroidism, varicose veins, B12 deficiency presented to the emergency room with abdominal discomfort, nausea vomiting.  1.Severe hypokalemia  Replace potassium intravenously  2.  Intractable nausea vomiting  antiemetics  3.  Alcoholic gastritis  IV Protonix 40 mg  every 12 hourly Gastroenterology consultation  4.  Alcohol abuse review of system patient complains of CIWA protocol  5. Dehydration IV fluid  hydration  6. Abdominal pain and weight loss GI consult Check abdominal ultrasound  All the records are reviewed and case discussed with ED provider. Management plans discussed with the patient, family and they are in agreement.  CODE STATUS:FULL CODE    Code Status Orders  (From admission, onward)        Start     Ordered   10/22/17 1455  Full code  Continuous     10/22/17 1454    Code Status History    Date Active Date Inactive Code Status Order ID Comments User Context   01/18/2017 1250 01/18/2017 2140 Full Code [id number]  Orbie Pyo, MD ED   11/02/2015 2000 11/05/2015 1938 Full Code [id number]  Henreitta Leber, MD Inpatient   09/01/2015 1650 09/05/2015 1648 Full Code [id number]  Clapacs, Madie Reno, MD Inpatient       TOTAL TIME TAKING CARE OF THIS PATIENT: 54 minutes.    Saundra Shelling M.D on 10/22/2017 at 3:33 PM  Between 7am to 6pm - Pager - [phone] Ascom [phone]  After 6pm go to [email or web address] - password [email or web address] [facility]  Office  [phone]  CC: Primary care physician; Tracie Harrier, MD

## 2017-10-22 NOTE — ED Notes (Signed)
Dr. Pyreddy at bedside  

## 2017-10-22 NOTE — ED Triage Notes (Signed)
Ems pt from home, interpreter requested , EMS reports hematuria, blood tingled emesis, lost 30 pounds in 2 weeks, increased weakness.   [facility] interpreter present. Daily ETOH . Pt complaining of epigastric pain radiating with burning sensation to his throat. Last 2 days with dizzy spells and near syncope.

## 2017-10-22 NOTE — Consult Note (Signed)
Ivan Lame, MD [facility]  [address] [place], [place] [address] Phone: [phone] Fax : [phone]  Consultation  Referring Provider:     Dr. Estanislado Pandy Primary Care Physician:  Ivan Lame, MD Primary Gastroenterologist:   unassigned         Reason for Consultation:     Abdominal pain and weight loss  Date of Admission:  10/22/2017 Date of Consultation:  10/22/2017         HPI:   Ivan Hamilton is a 52 y.o. male who comes in with a history of alcohol abuse and reports nausea and vomiting for last 2 weeks.  The patient states that he has not been able to keep anything down for the entire 2 weeks.  He is also not had a bowel movement 2 weeks.  He denies ever having a colonoscopy but does state that many years ago he had a upper endoscopy and was found to have 4 ulcers.  He thinks that was over 20 years ago.  The patient denies any vomiting up of blood but does report that he has had intermittent black stools.  There is no history of similar pain.  The patient states the pain is in the upper abdomen.  He has a history of a father who had colon cancer but the patient denies ever having had a colonoscopy.  He is not able to tell me how much weight he has lost but his daughter states that his clothes are falling off of him.  The patient was also found to have hypokalemia on admission.  His total bilirubin was 2.4 with his AST of 87.  The patient has a normal albumin and his platelets are also normal at 182.  The patient's hemoglobin is low at 11.4.  Past Medical History:  Diagnosis Date  . Alcohol withdrawal ([place])   . B12 deficiency   . Chronic pain   . Hypothyroidism   . Varicose veins     Past Surgical History:  Procedure Laterality Date  . LEG SURGERY      Prior to Admission medications   Medication Sig Start Date End Date Taking? Authorizing Provider  busPIRone (BUSPAR) 15 MG tablet Take 15 mg by mouth 2 (two) times daily. 08/30/17  Yes [provider]    levothyroxine (SYNTHROID, LEVOTHROID) 100 MCG tablet Take 100 mcg by mouth daily before breakfast.   Yes [provider]  omeprazole (PRILOSEC) 20 MG capsule Take 20 mg by mouth daily.   Yes [provider]  chlordiazePOXIDE (LIBRIUM) 25 MG capsule Take 1 capsule (25 mg total) by mouth 3 (three) times daily as needed for anxiety or withdrawal. Patient not taking: Reported on 10/22/2017 01/18/17   Carrie Mew, MD  FLUoxetine (PROZAC) 20 MG capsule Take 1 capsule (20 mg total) by mouth daily. Patient not taking: Reported on 10/22/2017 09/05/15   Hildred Priest, MD  gabapentin (NEURONTIN) 400 MG capsule Take 1 capsule (400 mg total) by mouth 3 (three) times daily. Patient not taking: Reported on 10/22/2017 09/05/15   Hildred Priest, MD  LORazepam (ATIVAN) 1 MG tablet Take 1 tablet (1 mg total) by mouth 2 (two) times daily as needed for anxiety. Patient not taking: Reported on 08/28/2016 11/05/15   Hower, Aaron Mose, MD  naltrexone (DEPADE) 50 MG tablet Take 1 tablet (50 mg total) by mouth daily. Patient not taking: Reported on 10/22/2017 09/05/15   Hildred Priest, MD    No family history on file.   Social History  Tobacco Use  . Smoking status: Never Smoker  . Smokeless tobacco: Never Used  Substance Use Topics  . Alcohol use: Yes    Comment: daily- 1 pint of vodka  . Drug use: No    Allergies as of 10/22/2017  . (No Known Allergies)    Review of Systems:    All systems reviewed and negative except where noted in HPI.   Physical Exam:  Vital signs in last 24 hours: Temp:  [98.1 F (36.7 C)-98.5 F (36.9 C)] 98.5 F (36.9 C) (04/02 1534) Pulse Rate:  [69-97] 77 (04/02 1534) Resp:  [7-25] 18 (04/02 1534) BP: (101-146)/(75-105) 119/83 (04/02 1534) SpO2:  [96 %-100 %] 100 % (04/02 1534) Weight:  [123 lb 7.3 oz (56 kg)-135 lb (61.2 kg)] 123 lb 7.3 oz (56 kg) (04/02 1534)   General:   Pleasant, cooperative in NAD Head:  Normocephalic  and atraumatic. Eyes:   No icterus.   Conjunctiva pink. PERRLA. Ears:  Normal auditory acuity. Neck:  Supple; no masses or thyroidomegaly Lungs: Respirations even and unlabored. Lungs clear to auscultation bilaterally.   No wheezes, crackles, or rhonchi.  Heart:  Regular rate and rhythm;  Without murmur, clicks, rubs or gallops Abdomen:  Soft, nondistended, positive tenderness with a positive Murphy sign.. Normal bowel sounds. No appreciable masses or hepatomegaly.  No rebound or guarding.  Rectal:  Not performed. Msk:  Symmetrical without gross deformities.    Extremities:  Without edema, cyanosis or clubbing. Neurologic:  Alert and oriented x3;  grossly normal neurologically. Skin:  Intact without significant lesions or rashes. Cervical Nodes:  No significant cervical adenopathy. Psych:  Alert and cooperative. Normal affect.  LAB RESULTS: Recent Labs    10/22/17 1007  WBC 8.2  HGB 11.4*  HCT 34.0*  PLT 182   BMET Recent Labs    10/22/17 1007  NA 135  K 2.5*  CL 87*  CO2 29  GLUCOSE 153*  BUN 8  CREATININE 0.85  CALCIUM 8.5*   LFT Recent Labs    10/22/17 1007  PROT 7.3  ALBUMIN 3.6  AST 87*  ALT 24  ALKPHOS 93  BILITOT 2.4*   PT/INR No results for input(s): LABPROT, INR in the last 72 hours.  STUDIES: No results found.    Impression / Plan:   Ivan Hamilton is a 52 y.o. y/o male with who comes in with 2 weeks of nausea and vomiting and has not had a bowel movement in those 2 weeks.  The patient states he is not able to keep anything down in the last 2 weeks and has lost weight although he cannot tell me how much weight.  The patient states he was 140 pounds in his doctor's office recently but was 160 pounds approximately a year ago.  Patient has a history of alcohol abuse and has had withdrawal in the past.  The patient will be set up for an upper endoscopy for tomorrow to rule out any further ulcers since he has a history of peptic ulcer disease.  The  patient has also been taking Alka-Seltzer which has aspirin products in it.  He is also supposed to go for a right upper quadrant ultrasound today and if this is positive for cholecystitis the patient will have his upper endoscopy canceled.  Thank you for involving me in the care of this patient.      LOS: 0 days   Ivan Lame, MD  10/22/2017, 4:02 PM   Note: This dictation was prepared with  Dragon dictation along with smaller Company secretary. Any transcriptional errors that result from this process are unintentional.

## 2017-10-22 NOTE — ED Provider Notes (Signed)
[facility] Emergency Department Provider Note   ____________________________________________    I have reviewed the triage vital signs and the nursing notes.   HISTORY  Chief Complaint Weakness     HPI Ivan Hamilton is a 52 y.o. male who presents with complaints of near syncopal episode.  Patient reports he has had nausea and vomiting over the last 4 days, he reports he is not been able to tolerate p.o.'s, denies diarrhea, some upper abdominal cramping pain.  History of hypothyroidism but denies a history of abdominal surgery.  No sick contacts reported.  No recent travel.  Today he was brushing his teeth and he splashed some water in his face and felt very lightheaded and fell forward.  EMS reports that the patient was orthostatic.  No chest pain or palpitations  Past Medical History:  Diagnosis Date  . Alcohol withdrawal ([place])   . B12 deficiency   . Chronic pain   . Hypothyroidism   . Varicose veins     Patient Active Problem List   Diagnosis Date Noted  . Alcohol withdrawal delirium ([place])   . Alcohol withdrawal ([place]) 09/02/2015  . Alcohol use disorder, severe, dependence ([place]) 09/02/2015  . 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 PTSD (post-traumatic stress disorder) 09/01/2015  . Hypothyroid 09/01/2015  . Gastric reflux 09/01/2015  . DDD (degenerative disc disease), lumbosacral 09/01/2015    Past Surgical History:  Procedure Laterality Date  . LEG SURGERY      Prior to Admission medications   Medication Sig Start Date End Date Taking? Authorizing Provider  busPIRone (BUSPAR) 15 MG tablet Take 15 mg by mouth 2 (two) times daily. 08/30/17  Yes [provider]  levothyroxine (SYNTHROID, LEVOTHROID) 100 MCG tablet Take 100 mcg by mouth daily before breakfast.   Yes [provider]  omeprazole (PRILOSEC) 20 MG capsule Take 20 mg by mouth  daily.   Yes [provider]  chlordiazePOXIDE (LIBRIUM) 25 MG capsule Take 1 capsule (25 mg total) by mouth 3 (three) times daily as needed for anxiety or withdrawal. Patient not taking: Reported on 10/22/2017 01/18/17   Carrie Mew, MD  FLUoxetine (PROZAC) 20 MG capsule Take 1 capsule (20 mg total) by mouth daily. Patient not taking: Reported on 10/22/2017 09/05/15   Hildred Priest, MD  gabapentin (NEURONTIN) 400 MG capsule Take 1 capsule (400 mg total) by mouth 3 (three) times daily. Patient not taking: Reported on 10/22/2017 09/05/15   Hildred Priest, MD  LORazepam (ATIVAN) 1 MG tablet Take 1 tablet (1 mg total) by mouth 2 (two) times daily as needed for anxiety. Patient not taking: Reported on 08/28/2016 11/05/15   Hower, Aaron Mose, MD  naltrexone (DEPADE) 50 MG tablet Take 1 tablet (50 mg total) by mouth daily. Patient not taking: Reported on 10/22/2017 09/05/15   Hildred Priest, MD     Allergies Patient has no known allergies.  No family history on file.  Social History Social History   Tobacco Use  . Smoking status: Never Smoker  . Smokeless tobacco: Never Used  Substance Use Topics  . Alcohol use: Yes    Comment: daily- 1 pint of vodka  . Drug use: No    Review of Systems  Constitutional: No fever/chills Eyes: No visual changes.  ENT: No sore throat. Cardiovascular: Denies chest pain. Respiratory: Denies shortness of breath. Gastrointestinal: Upper abdominal pain, nausea vomiting as above Genitourinary: Negative for dysuria. Musculoskeletal: Negative  for back pain. Skin: Negative for rash. Neurological: Negative for headaches   ____________________________________________   PHYSICAL EXAM:  VITAL SIGNS: ED Triage Vitals  Enc Vitals Group     BP 10/22/17 1000 118/88     Pulse Rate 10/22/17 1000 93     Resp 10/22/17 1000 18     Temp 10/22/17 1000 98.1 F (36.7 C)     Temp Source 10/22/17 1000 Oral     SpO2 10/22/17 1000  100 %     Weight --      Height --      Head Circumference --      Peak Flow --      Pain Score 10/22/17 1001 8     Pain Loc --      Pain Edu? --      Excl. in [place]? --     Constitutional: Alert and oriented.  Eyes: Conjunctivae are normal.  Head: Atraumatic. Nose: No congestion/rhinnorhea. Mouth/Throat: Mucous membranes are dry Neck:  Painless ROM Cardiovascular: Normal rate, regular rhythm. Grossly normal heart sounds.  Good peripheral circulation. Respiratory: Normal respiratory effort.  No retractions. Lungs CTAB. Gastrointestinal: Mild tenderness in the epigastrium. No distention.  No CVA tenderness. Genitourinary: deferred Musculoskeletal: No lower extremity tenderness nor edema.  Warm and well perfused Neurologic:  Normal speech and language. No gross focal neurologic deficits are appreciated.  Skin:  Skin is warm, dry and intact. No rash noted. Psychiatric: Mood and affect are normal. Speech and behavior are normal.  ____________________________________________   LABS (all labs ordered are listed, but only abnormal results are displayed)  Labs Reviewed  CBC - Abnormal; Notable for the following components:      Result Value   RBC 3.49 (*)    Hemoglobin 11.4 (*)    HCT 34.0 (*)    All other components within normal limits  COMPREHENSIVE METABOLIC PANEL - Abnormal; Notable for the following components:   Potassium 2.5 (*)    Chloride 87 (*)    Glucose, Bld 153 (*)    Calcium 8.5 (*)    AST 87 (*)    Total Bilirubin 2.4 (*)    Anion gap 19 (*)    All other components within normal limits  LIPASE, BLOOD   ____________________________________________  EKG  ED ECG REPORT I, Lavonia Drafts, the attending physician, personally viewed and interpreted this ECG.  Date: 10/22/2017  Rhythm: normal sinus rhythm QRS Axis: normal Intervals: normal ST/T Wave abnormalities: normal Narrative Interpretation: no evidence of acute  ischemia  ____________________________________________  RADIOLOGY  None ____________________________________________   PROCEDURES  Procedure(s) performed: No  Procedures   Critical Care performed: No ____________________________________________   INITIAL IMPRESSION / ASSESSMENT AND PLAN / ED COURSE  Pertinent labs & imaging results that were available during my care of the patient were reviewed by me and considered in my medical decision making (see chart for details).  Presents at or near syncopal episode, likely related to dehydration from severe gastritis over the last several days.  Review of records demonstrates the patient does have a history of alcohol abuse, pancreatitis is certainly a consideration as well.  Labs pending, IV fluids, IV Zofran will reassess  Patient with continued vomiting even after multiple doses of IV Zofran, hypokalemia from vomiting, will admit to the hospital service for rehydration further management    ____________________________________________   FINAL CLINICAL IMPRESSION(S) / ED DIAGNOSES  Final diagnoses:  Hypokalemia  Intractable vomiting with nausea, unspecified vomiting type  Acute alcoholic gastritis without  hemorrhage        Note:  This document was prepared using Dragon voice recognition software and may include unintentional dictation errors.     Lavonia Drafts, MD 10/22/17 1246

## 2017-10-22 NOTE — ED Notes (Signed)
Transport patient to room 256.AS

## 2017-10-22 NOTE — Progress Notes (Signed)
Advanced care plan.  Purpose of the Encounter: CODE STATUS  Parties in Attendance:Patient and family  Patient's Decision Capacity:Good  Subjective/Patient's story: Presented to the emergency room for nausea vomiting abdominal discomfort   Objective/Medical story Has low potassium level and appears dehydrated History of alcohol abuse    Goals of care determination: Discussed advanced directives Patient wants everything done such as cardiac resuscitation, intubation and ventilator management if the need arises    CODE STATUS: Full code   Time spent discussing advanced care planning: 16 minutes

## 2017-10-23 ENCOUNTER — Encounter: Payer: Self-pay | Admitting: Anesthesiology

## 2017-10-23 ENCOUNTER — Inpatient Hospital Stay (HOSPITAL_COMMUNITY)
Admit: 2017-10-23 | Discharge: 2017-10-23 | Disposition: A | Discharge status: Home / Self Care | Payer: Medicare Other | Attending: Internal Medicine | Admitting: Internal Medicine

## 2017-10-23 ENCOUNTER — Inpatient Hospital Stay: Payer: Medicare Other

## 2017-10-23 ENCOUNTER — Encounter
Admission: EM | Disposition: A | Discharge status: Home / Self Care | Payer: Self-pay | Source: Home / Self Care | Attending: Internal Medicine

## 2017-10-23 DIAGNOSIS — I5030 Unspecified diastolic (congestive) heart failure: Secondary | ICD-10-CM

## 2017-10-23 LAB — CBC
HCT: 28.9 % — ABNORMAL LOW (ref 40.0–52.0)
Hemoglobin: 9.7 g/dL — ABNORMAL LOW (ref 13.0–18.0)
MCH: 33.3 pg (ref 26.0–34.0)
MCHC: 33.7 g/dL (ref 32.0–36.0)
MCV: 98.9 fL (ref 80.0–100.0)
Platelets: 161 10*3/uL (ref 150–440)
RBC: 2.92 MIL/uL — ABNORMAL LOW (ref 4.40–5.90)
RDW: 13.7 % (ref 11.5–14.5)
WBC: 4.8 10*3/uL (ref 3.8–10.6)

## 2017-10-23 LAB — BILIRUBIN, FRACTIONATED(TOT/DIR/INDIR)
Bilirubin, Direct: 0.9 mg/dL — ABNORMAL HIGH (ref 0.1–0.5)
Indirect Bilirubin: 0.9 mg/dL (ref 0.3–0.9)
Total Bilirubin: 1.8 mg/dL — ABNORMAL HIGH (ref 0.3–1.2)

## 2017-10-23 LAB — BASIC METABOLIC PANEL
Anion gap: 2 — ABNORMAL LOW (ref 5–15)
Anion gap: 9 (ref 5–15)
BUN: 5 mg/dL — ABNORMAL LOW (ref 6–20)
BUN: 5 mg/dL — ABNORMAL LOW (ref 6–20)
CO2: 25 mmol/L (ref 22–32)
CO2: 31 mmol/L (ref 22–32)
Calcium: 6.5 mg/dL — ABNORMAL LOW (ref 8.9–10.3)
Calcium: 7.9 mg/dL — ABNORMAL LOW (ref 8.9–10.3)
Chloride: 112 mmol/L — ABNORMAL HIGH (ref 101–111)
Chloride: 96 mmol/L — ABNORMAL LOW (ref 101–111)
Creatinine, Ser: 0.52 mg/dL — ABNORMAL LOW (ref 0.61–1.24)
Creatinine, Ser: 0.6 mg/dL — ABNORMAL LOW (ref 0.61–1.24)
GFR calc Af Amer: >60 mL/min (ref >60)
GFR calc Af Amer: >60 mL/min (ref >60)
GFR calc non Af Amer: >60 mL/min (ref >60)
GFR calc non Af Amer: >60 mL/min (ref >60)
Glucose, Bld: 103 mg/dL — ABNORMAL HIGH (ref 65–99)
Glucose, Bld: 130 mg/dL — ABNORMAL HIGH (ref 65–99)
Potassium: >7.5 mmol/L (ref 3.5–5.1)
Potassium: 2.6 mmol/L — CL (ref 3.5–5.1)
Sodium: 139 mmol/L (ref 135–145)
Sodium: 136 mmol/L (ref 135–145)

## 2017-10-23 LAB — MAGNESIUM: Magnesium: 1.3 mg/dL — ABNORMAL LOW (ref 1.7–2.4)

## 2017-10-23 LAB — PHOSPHORUS: Phosphorus: 1.2 mg/dL — ABNORMAL LOW (ref 2.5–4.6)

## 2017-10-23 LAB — POTASSIUM: Potassium: 2.8 mmol/L — ABNORMAL LOW (ref 3.5–5.1)

## 2017-10-23 SURGERY — ESOPHAGOGASTRODUODENOSCOPY (EGD) WITH PROPOFOL
Anesthesia: General

## 2017-10-23 MED ORDER — TECHNETIUM TC 99M MEBROFENIN IV KIT
4.5200 | PACK | Freq: Once | INTRAVENOUS | Status: AC | PRN
  Administered 2017-10-23: 4.52 via INTRAVENOUS

## 2017-10-23 MED ORDER — MAGNESIUM SULFATE 4 GM/100ML IV SOLN
4.0000 g | Freq: Once | INTRAVENOUS | Status: AC
  Administered 2017-10-23: 4 g via INTRAVENOUS
  Filled 2017-10-23: qty 100

## 2017-10-23 MED ORDER — POTASSIUM CHLORIDE 20 MEQ PO PACK
40.0000 meq | PACK | Freq: Once | ORAL | Status: AC
Start: 2017-10-23 — End: 2017-10-23
  Administered 2017-10-23: 40 meq via ORAL
  Filled 2017-10-23: qty 2

## 2017-10-23 MED ORDER — POTASSIUM CHLORIDE 20 MEQ PO PACK: 40.0000 meq | PACK | Freq: Two times a day (BID) | ORAL | Status: DC

## 2017-10-23 MED ORDER — PANTOPRAZOLE SODIUM 40 MG IV SOLR
40.0000 mg | Freq: Two times a day (BID) | INTRAVENOUS | Status: DC
Start: 2017-10-23 — End: 2017-10-27
  Administered 2017-10-23 – 2017-10-26 (×7): 40 mg via INTRAVENOUS
  Filled 2017-10-23 (×7): qty 40

## 2017-10-23 MED ORDER — POTASSIUM CHLORIDE 20 MEQ PO PACK
40.0000 meq | PACK | Freq: Once | ORAL | Status: AC
  Administered 2017-10-23: 40 meq via ORAL
  Filled 2017-10-23: qty 2

## 2017-10-23 MED ORDER — SINCALIDE 5 MCG IJ SOLR
0.0200 ug/kg | Freq: Once | INTRAMUSCULAR | Status: AC
  Administered 2017-10-23: 1.1 ug via INTRAVENOUS

## 2017-10-23 MED ORDER — POTASSIUM PHOSPHATES 15 MMOLE/5ML IV SOLN
15.0000 mmol | Freq: Once | INTRAVENOUS | Status: AC
  Administered 2017-10-23: 15 mmol via INTRAVENOUS
  Filled 2017-10-23: qty 5

## 2017-10-23 NOTE — Progress Notes (Signed)
[place] NOTE - Electrolyte Management   Pharmacy Consult for Electrolyte Management Indication: Electrolyte Abnormalities  No Known Allergies  Patient Measurements: Height: 5\' 5"  (165.1 cm) Weight: 123 lb 7.3 oz (56 kg) IBW/kg (Calculated) : 61.5 Adjusted Body Weight:    Vital Signs: Temp: 98.1 F (36.7 C) (04/03 0727) Temp Source: Oral (04/03 0727) BP: 92/68 (04/03 0727) Pulse Rate: 84 (04/03 0727) Intake/Output from previous day: 04/02 0701 - 04/03 0700 In: 2000 [I.V.:2000] Out: 0  Intake/Output from this shift: No intake/output data recorded.  Labs: BMP Latest Ref Rng & Units 10/23/2017 10/23/2017 10/23/2017  Glucose 65 - 99 mg/dL - 130(H) 103(H)  BUN 6 - 20 mg/dL - 5(L) 5(L)  Creatinine 0.61 - 1.24 mg/dL - 0.60(L) 0.52(L)  Sodium 135 - 145 mmol/L - 136 139  Potassium 3.5 - 5.1 mmol/L 2.8(L) 2.6(LL) >7.5(HH)  Chloride 101 - 111 mmol/L - 96(L) 112(H)  CO2 22 - 32 mmol/L - 31 25  Calcium 8.9 - 10.3 mg/dL - 7.9(L) 6.5(L)   Phosphorus: 1.2 Magnesium: 1.3  Assessment: Patient is a 52yo male admitted for abdominal pain and vomiting times 5 days. Presents with hypokalemia with K of 2.5. Initially started on IV fluids with 87mEq of KCl at 127ml/hr. Pharmacy consulted for electrolyte management.  Goal of Therapy:  Maintain electrolytes within range.  Plan:  Will order KPhos 3mmol IV once and Mag Sulfate 4g IV once. Continue with current fluid order. Will follow up on AM labs.  Paulina Fusi, PharmD, BCPS 10/23/2017 3:59 PM

## 2017-10-23 NOTE — Progress Notes (Signed)
Dr. Ferol Luz made aware of + orthostatic VS and low K+ 2.6

## 2017-10-23 NOTE — Consult Note (Signed)
  Consult received to this afternoon.  I was not able to see the patient before the end of the day.  Chart reviewed.  Patient known to me from multiple prior encounters.  52 year old man with PTSD and alcohol abuse.  Reviewing the chart many of the symptoms and details of the history would suggest there is likely an element of alcohol withdrawal possible delirium tremens currently.  Patient has received medication in the past for PTSD but has struggled with sobriety.  I will be happy to see the patient tomorrow for further recommendations.

## 2017-10-23 NOTE — Progress Notes (Signed)
[place] at [place] NAME: Ivan Hamilton    MR#:  [id number]  DATE OF BIRTH:  04-18-1966  SUBJECTIVE: Patient admitted yesterday for syncope is, found to have orthostatic hypotension.  Patient complains of epigastric pain, right upper quadrant pain associated with vomiting, poor p.o. intake.  CHIEF COMPLAINT:   Chief Complaint  Patient presents with  . Weakness  To patient's daughter patient has been feeling weak, passed out at least 2-3 times since Saturday.  Not eating because of abdominal pain mainly epigastric area and also right upper quadrant associated with nausea, vomiting.  Patient was a heavy drinker and he used to drink at least 1 pint of tequila every day but recently for the past 1 week he could not keep anything down so he stopped.  He had  history of PTSD, because of insurance problems he could not get any psychiatric help and also continue to drink to help with PTSD.  REVIEW OF SYSTEMS:    Review of Systems  Constitutional: Negative for chills, fever and weight loss.  HENT: Negative for hearing loss.   Eyes: Negative for blurred vision, double vision and photophobia.  Respiratory: Negative for cough, hemoptysis and shortness of breath.   Cardiovascular: Negative for palpitations, orthopnea and leg swelling.  Gastrointestinal: Positive for abdominal pain, heartburn, nausea and vomiting. Negative for diarrhea.  Genitourinary: Negative for dysuria and urgency.  Musculoskeletal: Negative for myalgias and neck pain.  Skin: Negative for rash.  Neurological: Negative for dizziness, focal weakness, seizures, weakness and headaches.  Psychiatric/Behavioral: Negative for memory loss. The patient does not have insomnia.     Nutrition:  n.p.o. Tolerating Diet: Tolerating PT:      DRUG ALLERGIES:  No Known Allergies  VITALS:  Blood pressure 92/68, pulse 84, temperature 98.1 F (36.7 C), temperature source Oral, resp.  rate 16, height 5\' 5"  (1.651 m), weight 56 kg (123 lb 7.3 oz), SpO2 96 %.  PHYSICAL EXAMINATION:   Physical Exam  GENERAL:  52 y.o.-year-old patient lying in the bed with no acute distress.  EYES: Pupils equal, round, reactive to light . No scleral icterus. Extraocular muscles intact.  HEENT: Head atraumatic, normocephalic. Oropharynx and nasopharynx clear.  NECK:  Supple, no jugular venous distention. No thyroid enlargement, no tenderness.  LUNGS: Normal breath sounds bilaterally, no wheezing, rales,rhonchi or crepitation. No use of accessory muscles of respiration.  CARDIOVASCULAR: S1, S2 normal. No murmurs, rubs, or gallops.  ABDOMEN: Patient has tenderness in the right upper quadrant and also epigastric area.  No rebound tenderness, no CVA tenderness.  Bowel sounds present. EXTREMITIES: No pedal edema, cyanosis, or clubbing.  NEUROLOGIC: Cranial nerves II through XII are intact. Muscle strength 5/5 in all extremities. Sensation intact. Gait not checked.  PSYCHIATRIC: The patient is alert and oriented x 3.  SKIN: No obvious rash, lesion, or ulcer.    LABORATORY PANEL:   CBC Recent Labs  Lab 10/23/17 0715  WBC 4.8  HGB 9.7*  HCT 28.9*  PLT 161   ------------------------------------------------------------------------------------------------------------------  Chemistries  Recent Labs  Lab 10/22/17 1007 10/23/17 0316 10/23/17 0658  NA 135 139 136  K 2.5* >7.5* 2.6*  CL 87* 112* 96*  CO2 29 25 31   GLUCOSE 153* 103* 130*  BUN 8 5* 5*  CREATININE 0.85 0.52* 0.60*  CALCIUM 8.5* 6.5* 7.9*  AST 87*  --   --   ALT 24  --   --   ALKPHOS 93  --   --  BILITOT 2.4* 1.8*  --    ------------------------------------------------------------------------------------------------------------------  Cardiac Enzymes No results for input(s): TROPONINI in the last 168  hours. ------------------------------------------------------------------------------------------------------------------  RADIOLOGY:  US Abdomen Complete  Result Date: 10/22/2017 CLINICAL DATA:  Abdominal pain for 2 weeks. EXAM: ABDOMEN ULTRASOUND COMPLETE COMPARISON:  CT abdomen pelvis dated November 02, 2015. FINDINGS: Gallbladder: Layering sludge. There are few linear intraluminal membranes. No pericholecystic fluid. No wall thickening visualized. No sonographic Murphy sign noted by sonographer. Common bile duct: Diameter: 4 mm, normal. Liver: No focal lesion identified. Diffusely increased in parenchymal echogenicity. Portal vein is patent on color Doppler imaging with normal direction of blood flow towards the liver. IVC: No abnormality visualized. Pancreas: Visualized portion unremarkable. Spleen: Size and appearance within normal limits. Right Kidney: Length: 10.8 cm. Echogenicity within normal limits. No mass or hydronephrosis visualized. Left Kidney: Length: 10.9 cm. Echogenicity within normal limits. No mass or hydronephrosis visualized. Abdominal aorta: No aneurysm visualized. Other findings: None. IMPRESSION: 1. Layering gallbladder sludge with a few linear intraluminal membranes which could reflect sloughing of the mucosa in the setting of acute cholecystitis. However, there is no other sonographic evidence of acute cholecystitis at this time. Correlate clinically and consider HIDA scan for further evaluation. 2. Hepatic steatosis. Electronically Signed   By: Titus Dubin M.D.   On: 10/22/2017 17:52     ASSESSMENT AND PLAN:   Active Problems:   Hypokalemia   Intractable vomiting with nausea   #1 recurrent syncope likely due to orthostatic hypotension: Patient passed out multiple times since Saturday, patient does have orthostatic changes.  Patient receiving IV fluids along with potassium, check echocardiogram, monitor on telemetry for any arrhythmias.  2 hypokalemia secondary to poor  p.o. intake for the last few days secondary to epigastric pain, nausea, right upper quadrant pain.  Replace fluids and potassium. 3.  Right upper quadrant pain associated with episodes of vomiting, nausea unable to keep anything down: Gallbladder sludge seen on ultrasound recommended HIDA scan so I ordered a HIDA scan.  Patient states that he has 7 out of 10 pain in the right upper quadrant.   I discussed ultrasound findings with patient's daughter.  Continue IV PPIs, IV pain medicine, appreciate surgery also following the patient. 4.  History of heavy alcohol abuse with epigastric pain, nausea, vomiting, poor p.o. intake: Likely patient has acute gastritis,/continue IV PPIs, IV fluids, gastroenterology is already following the patient. Patient is probably having 2 problems with acute gastritis and also gallbladder problem. 5.  History of alcohol abuse, patient is not drinking and for the past 5 days and not in withdrawal at this time. 6.  History of PTSD, patient's family requesting psychiatric consult patient could not follow-up with psychiatry because of insurance problems now he has Medicaid and wants to get psychiatric consult. 7.  Hypothyroidism: Continue Synthyroid.   All the records are reviewed and case discussed with Care Management/Social Workerr. Management plans discussed with the patient, family and they are in agreement.  CODE STATUS: Full code  TOTAL TIME TAKING CARE OF THIS PATIENT: 35 minutes.   POSSIBLE D/C IN 2-3 DAYS, DEPENDING ON CLINICAL CONDITION. More than 50% of the time spent in coordination of the care, discussing medical condition and also lab test with patient's daughter.  Epifanio Lesches M.D on 10/23/2017 at 10:46 AM  Between 7am to 6pm - Pager - [phone]  After 6pm go to [email or web address] - password [email or web address] [facility]  [place] [facility]  Office  [phone]  CC: Primary care physician; [name],  MD

## 2017-10-24 ENCOUNTER — Inpatient Hospital Stay: Payer: Medicare Other | Admitting: Anesthesiology

## 2017-10-24 ENCOUNTER — Encounter
Admission: EM | Disposition: A | Discharge status: Home / Self Care | Payer: Self-pay | Source: Home / Self Care | Attending: Internal Medicine

## 2017-10-24 ENCOUNTER — Other Ambulatory Visit: Payer: Self-pay

## 2017-10-24 ENCOUNTER — Encounter: Payer: Self-pay | Admitting: Certified Registered Nurse Anesthetist

## 2017-10-24 DIAGNOSIS — K2970 Gastritis, unspecified, without bleeding: Secondary | ICD-10-CM

## 2017-10-24 DIAGNOSIS — K2980 Duodenitis without bleeding: Secondary | ICD-10-CM

## 2017-10-24 DIAGNOSIS — F4310 Post-traumatic stress disorder, unspecified: Secondary | ICD-10-CM

## 2017-10-24 HISTORY — PX: ESOPHAGOGASTRODUODENOSCOPY (EGD) WITH PROPOFOL: SHX5813

## 2017-10-24 LAB — BASIC METABOLIC PANEL
Anion gap: 8 (ref 5–15)
BUN: <5 mg/dL — ABNORMAL LOW (ref 6–20)
CO2: 27 mmol/L (ref 22–32)
Calcium: 8.2 mg/dL — ABNORMAL LOW (ref 8.9–10.3)
Chloride: 102 mmol/L (ref 101–111)
Creatinine, Ser: 0.63 mg/dL (ref 0.61–1.24)
GFR calc Af Amer: >60 mL/min (ref >60)
GFR calc non Af Amer: >60 mL/min (ref >60)
Glucose, Bld: 113 mg/dL — ABNORMAL HIGH (ref 65–99)
Potassium: 4 mmol/L (ref 3.5–5.1)
Sodium: 137 mmol/L (ref 135–145)

## 2017-10-24 LAB — MAGNESIUM: Magnesium: 2 mg/dL (ref 1.7–2.4)

## 2017-10-24 LAB — PHOSPHORUS
Phosphorus: <1 mg/dL — CL (ref 2.5–4.6)
Phosphorus: <1 mg/dL — CL (ref 2.5–4.6)

## 2017-10-24 LAB — ECHOCARDIOGRAM COMPLETE
Height: 65 in
Weight: 1975.32 oz

## 2017-10-24 LAB — HIV ANTIBODY (ROUTINE TESTING W REFLEX): HIV Screen 4th Generation wRfx: NONREACTIVE

## 2017-10-24 SURGERY — ESOPHAGOGASTRODUODENOSCOPY (EGD) WITH PROPOFOL
Anesthesia: General

## 2017-10-24 MED ORDER — SODIUM CHLORIDE 0.9 % IV SOLN
INTRAVENOUS | Status: DC
  Administered 2017-10-24: 1000 mL via INTRAVENOUS

## 2017-10-24 MED ORDER — LIDOCAINE HCL (CARDIAC) 20 MG/ML IV SOLN
INTRAVENOUS | Status: DC | PRN
  Administered 2017-10-24: 80 mg via INTRAVENOUS

## 2017-10-24 MED ORDER — SODIUM GLYCEROPHOSPHATE 1 MMOLE/ML IV SOLN: 20.0000 mmol | Freq: Once | INTRAVENOUS | Status: DC

## 2017-10-24 MED ORDER — SODIUM PHOSPHATES 45 MMOLE/15ML IV SOLN
30.0000 mmol | Freq: Once | INTRAVENOUS | Status: AC
  Administered 2017-10-24: 30 mmol via INTRAVENOUS
  Filled 2017-10-24: qty 10

## 2017-10-24 MED ORDER — FENTANYL CITRATE (PF) 100 MCG/2ML IJ SOLN
INTRAMUSCULAR | Status: AC
  Filled 2017-10-24: qty 2

## 2017-10-24 MED ORDER — ENSURE ENLIVE PO LIQD
237.0000 mL | Freq: Two times a day (BID) | ORAL | Status: DC
  Administered 2017-10-25 – 2017-10-28 (×6): 237 mL via ORAL

## 2017-10-24 MED ORDER — LIDOCAINE HCL (PF) 2 % IJ SOLN
INTRAMUSCULAR | Status: AC
  Filled 2017-10-24: qty 10

## 2017-10-24 MED ORDER — FLUOXETINE HCL 20 MG PO CAPS
20.0000 mg | ORAL_CAPSULE | Freq: Every day | ORAL | Status: DC
  Administered 2017-10-24 – 2017-10-26 (×3): 20 mg via ORAL
  Filled 2017-10-24 (×3): qty 1

## 2017-10-24 MED ORDER — FENTANYL CITRATE (PF) 100 MCG/2ML IJ SOLN
INTRAMUSCULAR | Status: DC | PRN
  Administered 2017-10-24 (×2): 50 ug via INTRAVENOUS

## 2017-10-24 MED ORDER — POTASSIUM & SODIUM PHOSPHATES 280-160-250 MG PO PACK: 1.0000 | PACK | Freq: Three times a day (TID) | ORAL | Status: DC

## 2017-10-24 MED ORDER — PROPOFOL 500 MG/50ML IV EMUL
INTRAVENOUS | Status: AC
  Filled 2017-10-24: qty 50

## 2017-10-24 NOTE — Anesthesia Post-op Follow-up Note (Signed)
Anesthesia QCDR form completed.        

## 2017-10-24 NOTE — Progress Notes (Signed)
[place] at [place] NAME: Ivan Hamilton    MR#:  [id number]  DATE OF BIRTH:  [birth year]  SUBJECTIVE: Withdrawal symptoms last night requiring Ativan.  He is arousable and complains of epigastric pain.  EGD could not be done yesterday because of hypokalemia.    CHIEF COMPLAINT:   Chief Complaint  Patient presents with  . Weakness  according patient's daughter patient has been feeling weak, passed out at least 2-3 times since Saturday.  Not eating because of abdominal pain mainly epigastric area and also right upper quadrant associated with nausea, vomiting.  Patient was a heavy drinker and he used to drink at least 1 pint of tequila every day but recently for the past 1 week he could not keep anything down so he stopped.  He had  history of PTSD, because of insurance problems he could not get any psychiatric help and also continue to drink to help with PTSD.  REVIEW OF SYSTEMS:    Review of Systems  Constitutional: Negative for chills, fever and weight loss.  HENT: Negative for hearing loss.   Eyes: Negative for blurred vision, double vision and photophobia.  Respiratory: Negative for cough, hemoptysis and shortness of breath.   Cardiovascular: Negative for palpitations, orthopnea and leg swelling.  Gastrointestinal: Positive for abdominal pain, heartburn, nausea and vomiting. Negative for diarrhea.  Genitourinary: Negative for dysuria and urgency.  Musculoskeletal: Negative for myalgias and neck pain.  Skin: Negative for rash.  Neurological: Negative for dizziness, focal weakness, seizures, weakness and headaches.  Psychiatric/Behavioral: Negative for memory loss. The patient does not have insomnia.     Nutrition:  n.p.o. Tolerating Diet: Tolerating PT:      DRUG ALLERGIES:  No Known Allergies  VITALS:  Blood pressure 114/81, pulse 89, temperature 98.6 F (37 C), temperature source Oral, resp. rate 20, height 5\' 5"  (1.651  m), weight 56.6 kg (124 lb 11.2 oz), SpO2 98 %.  PHYSICAL EXAMINATION:   Physical Exam  GENERAL:  52 y.o.-year-old patient lying in the bed with no acute distress.  EYES: Pupils equal, round, reactive to light . No scleral icterus. Extraocular muscles intact.  HEENT: Head atraumatic, normocephalic. Oropharynx and nasopharynx clear.  NECK:  Supple, no jugular venous distention. No thyroid enlargement, no tenderness.  LUNGS: Normal breath sounds bilaterally, no wheezing, rales,rhonchi or crepitation. No use of accessory muscles of respiration.  CARDIOVASCULAR: S1, S2 normal. No murmurs, rubs, or gallops.  ABDOMEN: Patient has tenderness in the right upper quadrant and also epigastric area.  No rebound tenderness, no CVA tenderness.  Bowel sounds present. EXTREMITIES: No pedal edema, cyanosis, or clubbing.  NEUROLOGIC: Cranial nerves II through XII are intact. Muscle strength 5/5 in all extremities. Sensation intact. Gait not checked.  PSYCHIATRIC: The patient is alert and oriented x 3.  SKIN: No obvious rash, lesion, or ulcer.    LABORATORY PANEL:   CBC Recent Labs  Lab 10/23/17 0715  WBC 4.8  HGB 9.7*  HCT 28.9*  PLT 161   ------------------------------------------------------------------------------------------------------------------  Chemistries  Recent Labs  Lab 10/22/17 1007 10/23/17 0316  10/24/17 0734  NA 135 139   < > 137  K 2.5* >7.5*   < > 4.0  CL 87* 112*   < > 102  CO2 29 25   < > 27  GLUCOSE 153* 103*   < > 113*  BUN 8 5*   < > <5*  CREATININE 0.85 0.52*   < > 0.63  CALCIUM 8.5* 6.5*   < > 8.2*  MG  --   --    < > 2.0  AST 87*  --   --   --   ALT 24  --   --   --   ALKPHOS 93  --   --   --   BILITOT 2.4* 1.8*  --   --    < > = values in this interval not displayed.   ------------------------------------------------------------------------------------------------------------------  Cardiac Enzymes No results for input(s): TROPONINI in the last 168  hours. ------------------------------------------------------------------------------------------------------------------  RADIOLOGY:  US Abdomen Complete  Result Date: 10/22/2017 CLINICAL DATA:  Abdominal pain for 2 weeks. EXAM: ABDOMEN ULTRASOUND COMPLETE COMPARISON:  CT abdomen pelvis dated November 02, 2015. FINDINGS: Gallbladder: Layering sludge. There are few linear intraluminal membranes. No pericholecystic fluid. No wall thickening visualized. No sonographic Murphy sign noted by sonographer. Common bile duct: Diameter: 4 mm, normal. Liver: No focal lesion identified. Diffusely increased in parenchymal echogenicity. Portal vein is patent on color Doppler imaging with normal direction of blood flow towards the liver. IVC: No abnormality visualized. Pancreas: Visualized portion unremarkable. Spleen: Size and appearance within normal limits. Right Kidney: Length: 10.8 cm. Echogenicity within normal limits. No mass or hydronephrosis visualized. Left Kidney: Length: 10.9 cm. Echogenicity within normal limits. No mass or hydronephrosis visualized. Abdominal aorta: No aneurysm visualized. Other findings: None. IMPRESSION: 1. Layering gallbladder sludge with a few linear intraluminal membranes which could reflect sloughing of the mucosa in the setting of acute cholecystitis. However, there is no other sonographic evidence of acute cholecystitis at this time. Correlate clinically and consider HIDA scan for further evaluation. 2. Hepatic steatosis. Electronically Signed   By: Titus Dubin M.D.   On: 10/22/2017 17:52   Nm Hepato W/eject Fract  Result Date: 10/23/2017 CLINICAL DATA:  Right upper quadrant pain and abnormal gallbladder ultrasound EXAM: NUCLEAR MEDICINE HEPATOBILIARY IMAGING WITH GALLBLADDER EF TECHNIQUE: Sequential images of the abdomen were obtained out to 60 minutes following intravenous administration of radiopharmaceutical. After slow intravenous infusion of 1.12 micrograms Cholecystokinin,  gallbladder ejection fraction was determined. RADIOPHARMACEUTICALS:  4.52 mCi Tc-65m Choletec IV COMPARISON:  Ultrasound from the previous day. FINDINGS: Prompt uptake and biliary excretion of activity by the liver is seen. Gallbladder activity is visualized, consistent with patency of cystic duct. Biliary activity passes into small bowel, consistent with patent common bile duct. Calculated gallbladder ejection fraction is 21%. (At 60 min, normal ejection fraction is greater than 40%.) IMPRESSION: Normal uptake and excretion of biliary tracer. The gallbladder is well visualized documenting cystic duct patency. Reduced gallbladder ejection fraction of 21%. This may be related to a more chronic component of cholecystitis. Electronically Signed   By: Inez Catalina M.D.   On: 10/23/2017 16:43     ASSESSMENT AND PLAN:   Active Problems:   Hypokalemia   Intractable vomiting with nausea   #1 .recurrent syncope likely due to orthostatic hypotension: Patient passed out multiple times since Saturday, patient does have orthostatic changes. Echocardiogram showed EF more than 55%.  Syncope likely secondary to hypotension with poor p.o. intake for last 1 week.  2 hypokalemia secondary to poor p.o. intake for the last few days secondary to epigastric pain, nausea, right upper quadrant pain.  Potassium improved.  The patient had severe hypo phosphatemia pharmacy consulted for  electrolyte management 3.  Right upper quadrant pain associated with episodes of vomiting, nausea unable to keep anything down: Gallbladder sludge seen on ultrasound recommended scan showed reduced gallbladder ejection  fraction, patient needs cholecystectomy .follow EGD results,t. 4.  History of heavy alcohol abuse with epigastric pain, nausea, vomiting, poor p.o. intake: Likely patient has acute gastritis,/continue IV PPIs, IV fluids, gastroenterology is already following the patient. Patient is probably having 2 problems with acute gastritis  and also gallbladder problem. 5.  History of alcohol abuse, patient is not drinking and for the past 5 days and not in withdrawal at this time. 6.  History of PTSD, appreciate psychiatric consult.   7.  Hypothyroidism: Continue Synthyroid.  Continue IV PPIs, monitor for withdrawal symptoms, follow EGD results, appreciate GI following, consult surgery. Spoke with patient and registered nurse. All the records are reviewed and case discussed with Care Management/Social Workerr. Management plans discussed with the patient, family and they are in agreement.  CODE STATUS: Full code  TOTAL TIME TAKING CARE OF THIS PATIENT: 35 minutes.   POSSIBLE D/C IN 2-3 DAYS, DEPENDING ON CLINICAL CONDITION. More than 50% of the time spent in coordination of the care, discussing medical condition and also lab test with patient's daughter.  Epifanio Lesches M.D on 10/24/2017 at 12:22 PM  Between 7am to 6pm - Pager - [phone]  After 6pm go to [email or web address] - password [email or web address] [facility]  [place] [facility]  Office  [phone]  CC: Primary care physician; [name], MD

## 2017-10-24 NOTE — Op Note (Signed)
[facility] Gastroenterology Patient Name: Ivan Hamilton Procedure Date: 10/24/2017 2:29 PM MRN: [id number] Account #: [id number] Date of Birth: 1966-07-02 Admit Type: Inpatient Age: 52 Room: [facility] ENDO ROOM 4 Gender: Male Note Status: Finalized Procedure:            Upper GI endoscopy Indications:          Iron deficiency anemia, Nausea with vomiting Providers:            [name] MD, MD Referring MD:         Tracie Harrier, MD (Referring MD) Medicines:            Propofol per Anesthesia Complications:        No immediate complications. Procedure:            Pre-Anesthesia Assessment:                       - Prior to the procedure, a History and Physical was                        performed, and patient medications and allergies were                        reviewed. The patient's tolerance of previous                        anesthesia was also reviewed. The risks and benefits of                        the procedure and the sedation options and risks were                        discussed with the patient. All questions were                        answered, and informed consent was obtained. Prior                        Anticoagulants: The patient has taken no previous                        anticoagulant or antiplatelet agents. ASA Grade                        Assessment: II - A patient with mild systemic disease.                        After reviewing the risks and benefits, the patient was                        deemed in satisfactory condition to undergo the                        procedure.                       After obtaining informed consent, the endoscope was                        passed under direct vision. Throughout the procedure,  the patient's blood pressure, pulse, and oxygen                        saturations were monitored continuously. The Endoscope                        was introduced through the mouth, and advanced  to the                        second part of duodenum. The upper GI endoscopy was                        accomplished without difficulty. The patient tolerated                        the procedure well. Findings:      The Z-line was irregular and was found at the gastroesophageal junction.      Diffuse moderate inflammation characterized by erythema was found in the       gastric antrum. Biopsies were taken with a cold forceps for histology.      Localized moderate inflammation characterized by erythema was found in       the duodenal bulb. Impression:           - Z-line irregular, at the gastroesophageal junction.                       - Gastritis. Biopsied.                       - Duodenitis. Recommendation:       - Return patient to hospital ward for ongoing care.                       - Advance diet as tolerated.                       - Continue present medications.                       - Await pathology results.                       - Use a proton pump inhibitor PO daily. Procedure Code(s):    --- Professional ---                       [phone], Esophagogastroduodenoscopy, flexible, transoral;                        with biopsy, single or multiple Diagnosis Code(s):    --- Professional ---                       R11.2, Nausea with vomiting, unspecified                       K29.80, Duodenitis without bleeding                       K29.70, Gastritis, unspecified, without bleeding CPT copyright 2017 [employer]. All rights reserved. The codes documented in this report are preliminary and upon coder review may  be revised to meet current compliance requirements.  [name] MD, MD 10/24/2017 2:42:40 PM This report has been signed electronically. Number of Addenda: 0 Note Initiated On: 10/24/2017 2:29 PM      [facility]

## 2017-10-24 NOTE — Progress Notes (Signed)
Critical phosphorus level <1.0 reported, MD Salary made aware. Pharmacy made aware. Pharmacy has existing consult. Sodium phosphate 30 mmol in D5 ordered.

## 2017-10-24 NOTE — Anesthesia Preprocedure Evaluation (Signed)
Anesthesia Evaluation  Patient identified by MRN, date of birth, ID band Patient awake    Reviewed: Allergy & Precautions, NPO status , Patient's Chart, lab work & pertinent test results  History of Anesthesia Complications Negative for: history of anesthetic complications  Airway Mallampati: III       Dental   Pulmonary neg sleep apnea, neg COPD,           Cardiovascular (-) hypertension(-) Past MI and (-) CHF (-) dysrhythmias (-) Valvular Problems/Murmurs     Neuro/Psych neg Seizures Anxiety Depression    GI/Hepatic Neg liver ROS, GERD  Medicated,(+)     substance abuse  alcohol use,   Endo/Other  neg diabetesHypothyroidism   Renal/GU negative Renal ROS     Musculoskeletal   Abdominal   Peds  Hematology   Anesthesia Other Findings   Reproductive/Obstetrics                             Anesthesia Physical Anesthesia Plan  ASA: III  Anesthesia Plan: General   Post-op Pain Management:    Induction:   PONV Risk Score and Plan: 2 and TIVA and Propofol infusion  Airway Management Planned: Nasal Cannula  Additional Equipment:   Intra-op Plan:   Post-operative Plan:   Informed Consent: I have reviewed the patients History and Physical, chart, labs and discussed the procedure including the risks, benefits and alternatives for the proposed anesthesia with the patient or authorized representative who has indicated his/her understanding and acceptance.     Plan Discussed with:   Anesthesia Plan Comments:         Anesthesia Quick Evaluation

## 2017-10-24 NOTE — Consult Note (Signed)
Date of Consultation:  10/24/2017  Requesting Physician:  Epifanio Lesches, MD  Reason for Consultation:  Biliary dyskinesia  History of Present Illness: Ivan Hamilton is a 52 y.o. male admitted with abdominal pain.  He has history of alcohol abuse and has been previously admitted with withdrawal symptoms.  He is currently withdrawing.  Had ultrasound with sludge and HIDA showing low EF.  However, he also had EGD with gastritis and duodenitis.  Has has nausea and vomiting and pain, and per family has lost weight over the past two weeks due to his symptoms and being unable to tolerate much po.  Past Medical History: Past Medical History:  Diagnosis Date  . Alcohol withdrawal ([place])   . B12 deficiency   . Chronic pain   . Hypothyroidism   . Varicose veins      Past Surgical History: Past Surgical History:  Procedure Laterality Date  . LEG SURGERY      Home Medications: Prior to Admission medications   Medication Sig Start Date End Date Taking? Authorizing Provider  busPIRone (BUSPAR) 15 MG tablet Take 15 mg by mouth 2 (two) times daily. 08/30/17  Yes [provider]  levothyroxine (SYNTHROID, LEVOTHROID) 100 MCG tablet Take 100 mcg by mouth daily before breakfast.   Yes [provider]  omeprazole (PRILOSEC) 20 MG capsule Take 20 mg by mouth daily.   Yes [provider]  chlordiazePOXIDE (LIBRIUM) 25 MG capsule Take 1 capsule (25 mg total) by mouth 3 (three) times daily as needed for anxiety or withdrawal. Patient not taking: Reported on 10/22/2017 01/18/17   Carrie Mew, MD  FLUoxetine (PROZAC) 20 MG capsule Take 1 capsule (20 mg total) by mouth daily. Patient not taking: Reported on 10/22/2017 09/05/15   Hildred Priest, MD  gabapentin (NEURONTIN) 400 MG capsule Take 1 capsule (400 mg total) by mouth 3 (three) times daily. Patient not taking: Reported on 10/22/2017 09/05/15   Hildred Priest, MD  LORazepam (ATIVAN) 1 MG tablet Take  1 tablet (1 mg total) by mouth 2 (two) times daily as needed for anxiety. Patient not taking: Reported on 08/28/2016 11/05/15   Hower, Aaron Mose, MD  naltrexone (DEPADE) 50 MG tablet Take 1 tablet (50 mg total) by mouth daily. Patient not taking: Reported on 10/22/2017 09/05/15   Hildred Priest, MD    Allergies: No Known Allergies  Social History:  reports that he has never smoked. He has never used smokeless tobacco. He reports that he drinks alcohol. He reports that he does not use drugs.   Family History: History reviewed. No pertinent family history.  Review of Systems: Review of Systems  Constitutional: Negative for chills and fever.  HENT: Negative for hearing loss.   Respiratory: Negative for shortness of breath.   Cardiovascular: Negative for chest pain.  Gastrointestinal: Positive for abdominal pain, nausea and vomiting. Negative for constipation and diarrhea.  Genitourinary: Negative for dysuria.  Musculoskeletal: Negative for myalgias.  Skin: Negative for rash.  Neurological: Negative for dizziness.  Psychiatric/Behavioral: Negative for depression.  All other systems reviewed and are negative.   Physical Exam BP 105/83   Pulse 90   Temp 97.6 F (36.4 C)   Resp 18   Ht 5\' 5"  (1.651 m)   Wt 124 lb 11.2 oz (56.6 kg)   SpO2 100%   BMI 20.75 kg/m  CONSTITUTIONAL: No acute distress HEENT:  Normocephalic, atraumatic, extraocular motion intact. NECK: Trachea is midline, and there is no jugular venous distension.  RESPIRATORY:  Lungs are clear,  and breath sounds are equal bilaterally. Normal respiratory effort without pathologic use of accessory muscles. CARDIOVASCULAR: Heart is regular without murmurs, gallops, or rubs. GI: The abdomen is soft, nondistended, with mild discomfort over the upper abdomen, from right to left upper quadrants and epigastric area.  No peritonitis and negative Murphy's sign. There were no palpable masses.  MUSCULOSKELETAL:  Normal muscle  strength and tone in all four extremities.  No peripheral edema or cyanosis. SKIN: Skin turgor is normal. There are no pathologic skin lesions.  NEUROLOGIC:  Motor and sensation is grossly normal.  Cranial nerves are grossly intact. PSYCH:  Confused but able to hold a conversation.  Laboratory Analysis: Results for orders placed or performed during the hospital encounter of 10/22/17 (from the past 24 hour(s))  Basic metabolic panel     Status: Abnormal   Collection Time: 10/24/17  7:34 AM  Result Value Ref Range   Sodium 137 135 - 145 mmol/L   Potassium 4.0 3.5 - 5.1 mmol/L   Chloride 102 101 - 111 mmol/L   CO2 27 22 - 32 mmol/L   Glucose, Bld 113 (H) 65 - 99 mg/dL   BUN <5 (L) 6 - 20 mg/dL   Creatinine, Ser 0.63 0.61 - 1.24 mg/dL   Calcium 8.2 (L) 8.9 - 10.3 mg/dL   GFR calc non Af Amer >60 >60 mL/min   GFR calc Af Amer >60 >60 mL/min   Anion gap 8 5 - 15  Phosphorus     Status: Abnormal   Collection Time: 10/24/17  7:34 AM  Result Value Ref Range   Phosphorus <1.0 (LL) 2.5 - 4.6 mg/dL  Magnesium     Status: None   Collection Time: 10/24/17  7:34 AM  Result Value Ref Range   Magnesium 2.0 1.7 - 2.4 mg/dL    Imaging: No results found.  Assessment and Plan: This is a 52 y.o. male who presents with abdominal pain.  I have independently viewed his imaging studies and reviewed his laboratory studies.  Overall he has gastritis and duodenitis and a low EF on HIDA scan.  However, given his current delirium would not be a surgical candidate.  At this point, there is no acute surgical need.  Would want him to be treated for withdrawal and be sober and also be treated for his duodenitis and gastritis first to see what his true gallbladder symptoms really are.  If optimal medical management does not help, then we can consider cholecystectomy.  He can be followed up as an outpatient for this.     Melvyn Neth, [place]

## 2017-10-24 NOTE — Consult Note (Signed)
[facility] Face-to-Face Psychiatry Consult   Reason for Consult: Consult for 52 year old man with a history of alcohol abuse and PTSD currently in the hospital with severe abdominal pain and inability to eat. Referring Physician: Vianne Bulls Patient Identification: Ivan Hamilton MRN:  [id number] Principal Diagnosis: PTSD (post-traumatic stress disorder) Diagnosis:   Patient Active Problem List   Diagnosis Date Noted  . Duodenitis [K29.80]   . Gastritis without bleeding [K29.70]   . Hypokalemia [E87.6] 10/22/2017  . Intractable vomiting with nausea [R11.2]   . Alcohol withdrawal delirium ([place]) [F10.231]   . Alcohol withdrawal ([place]) [F10.239] 09/02/2015  . Alcohol use disorder, severe, dependence ([place]) [F10.20] 09/02/2015  . 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[place]) [F10.229, F10.24, F32.89] 09/02/2015  . PTSD (post-traumatic stress disorder) [F43.10] 09/01/2015  . Hypothyroid [E03.9] 09/01/2015  . Gastric reflux [K21.9] 09/01/2015  . DDD (degenerative disc disease), lumbosacral [M51.37] 09/01/2015    Total Time spent with patient: 1 hour  Subjective:   Ivan Hamilton is a 52 y.o. male patient admitted with "I have not been eating".  HPI: Patient interviewed.  Chart reviewed.  Also spoke with his daughter.  Patient known to me from several prior encounters.  52 year old man with a history of alcohol abuse and posttraumatic stress disorder.  Presents to the hospital with severe abdominal pain.  Apparently has been vomiting every time he tried to eat for the last several days and so has not felt like eating.  Patient tells me that is been about a week since his last drink and that when he did drink it was only about 1 shot of tequila because that was all he could tolerate.  Patient denies currently any suicidal thoughts.  Does not present as delusional.  Does still feel anxiety chronically.  Right now he is pretty run down and  tired and not able to give much history.  Daughter reports that he has been off of his medication and out of therapy for many months because he had no insurance.  She thinks he is got his insurance reinstated now.  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  He lives with his wife.  2 adult children.  Typically not working outside the home currently because of his disability now.  Medical history: History of ulcers in the past hypothyroidism.  Currently presenting with weight loss nausea vomiting altered mental status.  Substance abuse history: Long-standing alcohol abuse going back decades.  Patient has good insight about it but has struggled to maintain much sobriety.  He has a past history of delirium tremens at times.  Does not abuse other drugs typically.  Past Psychiatric History: Patient has posttraumatic stress disorder with chronic anxiety irritability difficulty being around other people intrusive flashbacks.  Had good benefit from appropriate medication and psychotherapy but has not been in treatment recently.  Patient's illness has been complicated by his heavy abuse of alcohol.  He does have a past history of suicide attempts.  Risk to Self: Is patient at risk for suicide?: No Risk to Others:   Prior Inpatient Therapy:   Prior Outpatient Therapy:    Past Medical History:  Past Medical History:  Diagnosis Date  . Alcohol withdrawal ([place])   . B12 deficiency   . Chronic pain   . Hypothyroidism   . Varicose veins     Past Surgical History:  Procedure Laterality Date  . LEG  SURGERY     Family History: History reviewed. No pertinent family history. Family Psychiatric  History: Probably alcohol abuse Social History:  Social History   Substance and Sexual Activity  Alcohol Use Yes   Comment: daily- 1 pint of vodka     Social History   Substance and Sexual Activity  Drug Use No     Social History   Socioeconomic History  . Marital status: Married    Spouse name: Not on file  . Number of children: Not on file  . Years of education: Not on file  . Highest education level: Not on file  Occupational History  . Not on file  Social Needs  . Financial resource strain: Not on file  . Food insecurity:    Worry: Not on file    Inability: Not on file  . Transportation needs:    Medical: Not on file    Non-medical: Not on file  Tobacco Use  . Smoking status: Never Smoker  . Smokeless tobacco: Never Used  Substance and Sexual Activity  . Alcohol use: Yes    Comment: daily- 1 pint of vodka  . Drug use: No  . Sexual activity: Not on file  Lifestyle  . Physical activity:    Days per week: Not on file    Minutes per session: Not on file  . Stress: Not on file  Relationships  . Social connections:    Talks on phone: Not on file    Gets together: Not on file    Attends religious service: Not on file    Active member of club or organization: Not on file    Attends meetings of clubs or organizations: Not on file    Relationship status: Not on file  Other Topics Concern  . Not on file  Social History Narrative  . Not on file   Additional Social History:    Allergies:  No Known Allergies  Labs:  Results for orders placed or performed during the hospital encounter of 10/22/17 (from the past 48 hour(s))  Urinalysis, Routine w reflex microscopic     Status: Abnormal   Collection Time: 10/22/17  3:32 PM  Result Value Ref Range   Color, Urine YELLOW (A) YELLOW    Comment: CORRECTED ON 04/02 AT 1657: PREVIOUSLY REPORTED AS AMBER BIOCHEMICALS MAY BE AFFECTED BY COLOR   APPearance CLOUDY (A) CLEAR    Comment: CORRECTED ON 04/02 AT 1657: PREVIOUSLY REPORTED AS HAZY   Specific Gravity, Urine 1.009 1.005 - 1.030   pH 7.0 5.0 - 8.0   Glucose, UA NEGATIVE NEGATIVE mg/dL   Hgb urine dipstick LARGE (A) NEGATIVE   Bilirubin Urine NEGATIVE NEGATIVE   Ketones, ur 5 (A)  NEGATIVE mg/dL   Protein, ur NEGATIVE NEGATIVE mg/dL   Nitrite NEGATIVE NEGATIVE   Leukocytes, UA NEGATIVE NEGATIVE   RBC / HPF TOO NUMEROUS TO COUNT 0 - 5 RBC/hpf   WBC, UA 0-5 0 - 5 WBC/hpf   Bacteria, UA NONE SEEN NONE SEEN   Squamous Epithelial / LPF NONE SEEN NONE SEEN   Mucus PRESENT     Comment: Performed at [facility], [place], [address] [place], [place] [address]  Urinalysis, Microscopic (reflex)     Status: None   Collection Time: 10/22/17  3:32 PM  Result Value Ref Range   RBC / HPF TOO NUMEROUS TO COUNT 0 - 5 RBC/hpf   WBC, UA 0-5 0 - 5 WBC/hpf   Bacteria, UA NONE SEEN NONE SEEN  Squamous Epithelial / LPF NONE SEEN NONE SEEN   Mucus PRESENT     Comment: Performed at [facility], [address] [place], [place] [address]  HIV antibody (Routine Testing)     Status: None   Collection Time: 10/23/17  3:16 AM  Result Value Ref Range   HIV Screen 4th Generation wRfx Non Reactive Non Reactive    Comment: (NOTE) Performed At: [facility] [place], [place] [id number] Rush Farmer MD [id number] Performed at [facility], [address] [place], [place] [address]   CBC     Status: None   Collection Time: 10/23/17  3:16 AM  Result Value Ref Range   WBC TEST WILL BE CREDITED 3.8 - 10.6 K/uL    Comment: SPECIMEN CONTAMINATED, UNABLE TO PERFORM TEST(S). CORRECTED ON 04/03 AT 0723: PREVIOUSLY REPORTED AS 5.2    RBC TEST WILL BE CREDITED 4.40 - 5.90 MIL/uL    Comment: CORRECTED ON 04/03 AT 0723: PREVIOUSLY REPORTED AS 2.49   Hemoglobin TEST WILL BE CREDITED 13.0 - 18.0 g/dL    Comment: CORRECTED ON 04/03 AT 0723: PREVIOUSLY REPORTED AS 8.2   HCT TEST WILL BE CREDITED 40.0 - 52.0 %    Comment: CORRECTED ON 04/03 AT 0723: PREVIOUSLY REPORTED AS 24.5   MCV TEST WILL BE CREDITED 80.0 - 100.0 fL    Comment: CORRECTED ON 04/03 AT 0723: PREVIOUSLY REPORTED AS 98.5   MCH TEST WILL BE CREDITED 26.0 - 34.0 pg    Comment:  CORRECTED ON 04/03 AT 0723: PREVIOUSLY REPORTED AS 32.9   MCHC TEST WILL BE CREDITED 32.0 - 36.0 g/dL    Comment: CORRECTED ON 04/03 AT 0723: PREVIOUSLY REPORTED AS 33.4   RDW TEST WILL BE CREDITED 11.5 - 14.5 %    Comment: CORRECTED ON 04/03 AT 2376: PREVIOUSLY REPORTED AS 13.9   Platelets TEST WILL BE CREDITED 150 - 440 K/uL    Comment: Performed at [facility], [address] [place], [place] [address] CORRECTED ON 04/03 AT 0723: PREVIOUSLY REPORTED AS 176   Basic metabolic panel     Status: Abnormal   Collection Time: 10/23/17  3:16 AM  Result Value Ref Range   Sodium 139 135 - 145 mmol/L    Comment: LYTES REPEATED. JAG QUESTIONABLE SPECIMEN INTEGRITY. PROBABLE CONTAMINATION WITH IV. SPECIMEN RECOLLECTED AND TEST CREDITED.    Potassium >7.5 (HH) 3.5 - 5.1 mmol/L    Comment: RESULT CONFIRMED BY MANUAL DILUTION. JAG CRITICAL RESULT CALLED TO, READ BACK BY AND VERIFIED WITH: Hoyle Sauer ON 10/23/17 AT 0431 JAG QUESTIONABLE SPECIMEN INTEGRITY. PROBABLE CONTAMINATION WITH IV. SPECIMEN RECOLLECTED AND TEST CREDITED.    Chloride 112 (H) 101 - 111 mmol/L    Comment: QUESTIONABLE SPECIMEN INTEGRITY. PROBABLE CONTAMINATION WITH IV. SPECIMEN RECOLLECTED AND TEST CREDITED.   CO2 25 22 - 32 mmol/L    Comment: QUESTIONABLE SPECIMEN INTEGRITY. PROBABLE CONTAMINATION WITH IV. SPECIMEN RECOLLECTED AND TEST CREDITED.   Glucose, Bld 103 (H) 65 - 99 mg/dL   BUN 5 (L) 6 - 20 mg/dL    Comment: QUESTIONABLE SPECIMEN INTEGRITY. PROBABLE CONTAMINATION WITH IV. SPECIMEN RECOLLECTED AND TEST CREDITED.   Creatinine, Ser 0.52 (L) 0.61 - 1.24 mg/dL   Calcium 6.5 (L) 8.9 - 10.3 mg/dL    Comment: QUESTIONABLE SPECIMEN INTEGRITY. PROBABLE CONTAMINATION WITH IV. SPECIMEN RECOLLECTED AND TEST CREDITED.   GFR calc non Af Amer >60 >60 mL/min    Comment: QUESTIONABLE SPECIMEN INTEGRITY. PROBABLE CONTAMINATION WITH IV. SPECIMEN RECOLLECTED AND TEST CREDITED.  GFR calc Af Amer >60 >60 mL/min     Comment: QUESTIONABLE SPECIMEN INTEGRITY. PROBABLE CONTAMINATION WITH IV. SPECIMEN RECOLLECTED AND TEST CREDITED. (NOTE) The eGFR has been calculated using the CKD EPI equation. This calculation has not been validated in all clinical situations. eGFR's persistently <60 mL/min signify possible Chronic Kidney Disease.    Anion gap 2 (L) 5 - 15    Comment: QUESTIONABLE SPECIMEN INTEGRITY. PROBABLE CONTAMINATION WITH IV. SPECIMEN RECOLLECTED AND TEST CREDITED. Performed at [facility], [address] [place], [place] [address]   Bilirubin, fractionated(tot/dir/indir)     Status: Abnormal   Collection Time: 10/23/17  3:16 AM  Result Value Ref Range   Total Bilirubin 1.8 (H) 0.3 - 1.2 mg/dL   Bilirubin, Direct 0.9 (H) 0.1 - 0.5 mg/dL   Indirect Bilirubin 0.9 0.3 - 0.9 mg/dL    Comment: Performed at [facility], [address] [place], [place] [address]  Basic metabolic panel     Status: Abnormal   Collection Time: 10/23/17  6:58 AM  Result Value Ref Range   Sodium 136 135 - 145 mmol/L   Potassium 2.6 (LL) 3.5 - 5.1 mmol/L    Comment: CRITICAL RESULT CALLED TO, READ BACK BY AND VERIFIED WITH  JESSICA CHRISTMAS AT 0725 10/23/17 SDR    Chloride 96 (L) 101 - 111 mmol/L   CO2 31 22 - 32 mmol/L   Glucose, Bld 130 (H) 65 - 99 mg/dL   BUN 5 (L) 6 - 20 mg/dL   Creatinine, Ser 0.60 (L) 0.61 - 1.24 mg/dL   Calcium 7.9 (L) 8.9 - 10.3 mg/dL   GFR calc non Af Amer >60 >60 mL/min   GFR calc Af Amer >60 >60 mL/min    Comment: (NOTE) The eGFR has been calculated using the CKD EPI equation. This calculation has not been validated in all clinical situations. eGFR's persistently <60 mL/min signify possible Chronic Kidney Disease.    Anion gap 9 5 - 15    Comment: Performed at [facility], [address] [place], [place] [address]  Phosphorus     Status: Abnormal   Collection Time: 10/23/17  6:58 AM  Result Value Ref Range   Phosphorus 1.2 (L) 2.5 - 4.6 mg/dL     Comment: Performed at [facility], [address] [place], [place] [address]  Magnesium     Status: Abnormal   Collection Time: 10/23/17  6:58 AM  Result Value Ref Range   Magnesium 1.3 (L) 1.7 - 2.4 mg/dL    Comment: Performed at [facility], [address] [place], [place] [address]  CBC     Status: Abnormal   Collection Time: 10/23/17  7:15 AM  Result Value Ref Range   WBC 4.8 3.8 - 10.6 K/uL   RBC 2.92 (L) 4.40 - 5.90 MIL/uL   Hemoglobin 9.7 (L) 13.0 - 18.0 g/dL   HCT 28.9 (L) 40.0 - 52.0 %   MCV 98.9 80.0 - 100.0 fL   MCH 33.3 26.0 - 34.0 pg   MCHC 33.7 32.0 - 36.0 g/dL   RDW 13.7 11.5 - 14.5 %   Platelets 161 150 - 440 K/uL    Comment: Performed at [facility], [address], [place],  [address]  Potassium     Status: Abnormal   Collection Time: 10/23/17 12:51 PM  Result Value Ref Range   Potassium 2.8 (L) 3.5 - 5.1 mmol/L    Comment: Performed at [facility], [address] [place], [place] [address]  Basic metabolic panel     Status: Abnormal   Collection Time: 10/24/17  7:34 AM  Result Value Ref Range   Sodium 137 135 - 145 mmol/L   Potassium 4.0 3.5 - 5.1 mmol/L   Chloride 102 101 - 111 mmol/L   CO2 27 22 - 32 mmol/L   Glucose, Bld 113 (H) 65 - 99 mg/dL   BUN <5 (L) 6 - 20 mg/dL   Creatinine, Ser 0.63 0.61 - 1.24 mg/dL   Calcium 8.2 (L) 8.9 - 10.3 mg/dL   GFR calc non Af Amer >60 >60 mL/min   GFR calc Af Amer >60 >60 mL/min    Comment: (NOTE) The eGFR has been calculated using the CKD EPI equation. This calculation has not been validated in all clinical situations. eGFR's persistently <60 mL/min signify possible Chronic Kidney Disease.    Anion gap 8 5 - 15    Comment: Performed at [facility], [address] [place], [place] [address]  Phosphorus     Status: Abnormal   Collection Time: 10/24/17  7:34 AM  Result Value Ref Range   Phosphorus <1.0 (LL) 2.5 - 4.6 mg/dL    Comment: CRITICAL  RESULT CALLED TO, READ BACK BY AND VERIFIED WITH JESSICA CHRISTMAS AT 0102 10/24/17 DAS Performed at [facility], [address] [place], [place] [address]   Magnesium     Status: None   Collection Time: 10/24/17  7:34 AM  Result Value Ref Range   Magnesium 2.0 1.7 - 2.4 mg/dL    Comment: Performed at [facility], [address] [place], [place] [address]    Current Facility-Administered Medications  Medication Dose Route Frequency Provider Last Rate Last Dose  . 0.9 % NaCl with KCl 40 mEq / L  infusion   Intravenous Continuous [name], MD 125 mL/hr at 10/23/17 1644 125 mL/hr at 10/23/17 1644  . [MAR Hold] acetaminophen (TYLENOL) tablet 650 mg  650 mg Oral Q6H PRN [name], MD       Or  . Doug Sou Hold] acetaminophen (TYLENOL) suppository 650 mg  650 mg Rectal Q6H PRN [name], MD      . Doug Sou Hold] busPIRone (BUSPAR) tablet 15 mg  15 mg Oral BID [name], MD   15 mg at 10/23/17 2108  . [MAR Hold] enoxaparin (LOVENOX) injection 40 mg  40 mg Subcutaneous q1800 [name], MD   40 mg at 10/23/17 1640  . [START ON 10/25/2017] feeding supplement (ENSURE ENLIVE) (ENSURE ENLIVE) liquid 237 mL  237 mL Oral BID BM Epifanio Lesches, MD      . FLUoxetine (PROZAC) capsule 20 mg  20 mg Oral Daily Lindbergh Winkles, Madie Reno, MD      . Doug Sou Hold] folic acid (FOLVITE) tablet 1 mg  1 mg Oral Daily [name], MD   1 mg at 10/23/17 1018  . [MAR Hold] HYDROmorphone (DILAUDID) injection 0.5 mg  0.5 mg Intravenous Q4H PRN [name], MD   0.5 mg at 10/22/17 2345  . [MAR Hold] levothyroxine (SYNTHROID, LEVOTHROID) tablet 100 mcg  100 mcg Oral QAC breakfast [name], MD   100 mcg at 10/23/17 1017  . [MAR Hold] LORazepam (ATIVAN) injection 0-4 mg  0-4 mg Intravenous Q12H [name], MD      . Doug Sou Hold] LORazepam (ATIVAN) tablet 1 mg  1 mg Oral Q6H PRN [name], MD   1 mg at 10/24/17 0008   Or  . [facility]  Hold] LORazepam (ATIVAN) injection 1 mg  1 mg Intravenous Q6H PRN [name], MD   1 mg at 10/23/17 0030  . [MAR Hold] multivitamin with minerals tablet 1 tablet  1 tablet Oral Daily [name], MD   1 tablet at 10/23/17 1017  . [MAR Hold] ondansetron (ZOFRAN) tablet 4 mg  4 mg Oral Q6H PRN [name], MD       Or  . Doug Sou Hold] ondansetron [facility]) injection 4 mg  4 mg Intravenous Q6H PRN [name], MD   4 mg at 10/22/17 2258  . [MAR Hold] pantoprazole (PROTONIX) injection 40 mg  40 mg Intravenous Q12H [name], MD   40 mg at 10/24/17 0946  . [MAR Hold] senna-docusate (Senokot-S) tablet 1 tablet  1 tablet Oral QHS PRN [name], MD   1 tablet at 10/22/17 2149  . sodium phosphate 30 mmol in dextrose 5 % 250 mL infusion  30 mmol Intravenous Once Epifanio Lesches, MD 43 mL/hr at 10/24/17 1011 30 mmol at 10/24/17 1011  . [MAR Hold] thiamine (VITAMIN B-1) tablet 100 mg  100 mg Oral Daily [name], MD   100 mg at 10/23/17 1018   Or  . [MAR Hold] thiamine (B-1) injection 100 mg  100 mg Intravenous Daily [name], MD   100 mg at 10/22/17 1346    Musculoskeletal: Strength & Muscle Tone: decreased Gait & Station: unable to stand Patient leans: N/A  Psychiatric Specialty Exam: Physical Exam  Nursing note and vitals reviewed. Constitutional: He appears well-developed and well-nourished.  HENT:  Head: Normocephalic and atraumatic.  Eyes: Pupils are equal, round, and reactive to light. Conjunctivae are normal.  Neck: Normal range of motion.  Cardiovascular: Regular rhythm and normal heart sounds.  Respiratory: Effort normal. No respiratory distress.  GI: Soft.  Musculoskeletal: Normal range of motion.  Neurological: He is alert.  Skin: Skin is warm and dry.  Psychiatric: Judgment normal. His affect is blunt. His speech is delayed and slurred. He is slowed. He is not agitated and not aggressive. Cognition and memory are impaired. He expresses no homicidal and no suicidal ideation.    Review of Systems  Constitutional: Negative.   HENT:  Negative.   Eyes: Negative.   Respiratory: Negative.   Cardiovascular: Negative.   Gastrointestinal: Positive for abdominal pain and vomiting.  Musculoskeletal: Negative.   Skin: Negative.   Neurological: Negative.   Psychiatric/Behavioral: Positive for memory loss and substance abuse. Negative for depression, hallucinations and suicidal ideas. The patient is nervous/anxious and has insomnia.     Blood pressure (!) 119/92, pulse 95, temperature 97.6 F (36.4 C), resp. rate 16, height _0  (1.651 m), weight 56.6 kg (124 lb 11.2 oz), SpO2 100 %.Body mass index is 20.75 kg/m.  General Appearance: Casual  Eye Contact:  Minimal  Speech:  Slow and Slurred  Volume:  Decreased  Mood:  Depressed and Dysphoric  Affect:  Constricted  Thought Process:  Goal Directed  Orientation:  Full (Time, Place, and Person)  Thought Content:  Logical  Suicidal Thoughts:  No  Homicidal Thoughts:  No  Memory:  Immediate;   Fair Recent;   Fair Remote;   Fair  Judgement:  Fair  Insight:  Fair  Psychomotor Activity:  Decreased  Concentration:  Concentration: Poor  Recall:  [employer] of Knowledge:  Fair  Language:  Fair  Akathisia:  No  Handed:  Right  AIMS (if indicated):     Assets:  Desire for Improvement Housing  Resilience Social Support  ADL's:  Impaired  Cognition:  Impaired,  Mild  Sleep:        Treatment Plan Summary: Daily contact with patient to assess and evaluate symptoms and progress in treatment, Medication management and Plan Patient with alcohol abuse and posttraumatic stress disorder.  Although he has been drinking less recently it is always hard to know how much to trust persons history especially when they have memory impairment.  I would recommend continued monitoring for the possibility of delirium tremens.  I have gone ahead and restarted his fluoxetine although right now he probably is going to have a difficult time taking p.o. medicine.  Once he is stable he definitely  needs to be referred back to appropriate substance abuse therapy and psychotherapy for his illness.  No need for IV C no need for involuntary commitment.  Case reviewed with his daughter.  I will follow up regularly.  Disposition: No evidence of imminent risk to self or others at present.   Patient does not meet criteria for psychiatric inpatient admission. Supportive therapy provided about ongoing stressors.  Alethia Berthold, MD 10/24/2017 3:28 PM

## 2017-10-24 NOTE — Transfer of Care (Signed)
Immediate Anesthesia Transfer of Care Note  Patient: Ivan Hamilton  Procedure(s) Performed: ESOPHAGOGASTRODUODENOSCOPY (EGD) WITH PROPOFOL (N/A )  Patient Location: PACU  Anesthesia Type:General  Level of Consciousness: sedated  Airway & Oxygen Therapy: Patient Spontanous Breathing and Patient connected to nasal cannula oxygen  Post-op Assessment: Report given to RN and Post -op Vital signs reviewed and stable  Post vital signs: Reviewed and stable  Last Vitals:  Vitals Value Taken Time  BP 104/66 10/24/2017  2:47 PM  Temp    Pulse 103 10/24/2017  2:48 PM  Resp 8 10/24/2017  2:48 PM  SpO2 100 % 10/24/2017  2:48 PM  Vitals shown include unvalidated device data.  Last Pain:  Vitals:   10/24/17 0815  TempSrc: Oral  PainSc:          Complications: No apparent anesthesia complications

## 2017-10-24 NOTE — Progress Notes (Signed)
PHARNACY CONSULT NOTE - Electrolyte Management   Pharmacy Consult for Electrolyte Management Indication: Electrolyte Abnormalities  No Known Allergies  Patient Measurements: Height: 5\' 5"  (165.1 cm) Weight: 124 lb 11.2 oz (56.6 kg) IBW/kg (Calculated) : 61.5 Adjusted Body Weight:    Vital Signs: Temp: 97.6 F (36.4 C) (04/04 1448) Temp Source: Oral (04/04 0815) BP: 105/83 (04/04 1712) Pulse Rate: 90 (04/04 1712) Intake/Output from previous day: 04/03 0701 - 04/04 0700 In: 240 [P.O.:240] Out: -  Intake/Output from this shift: No intake/output data recorded.  Labs: BMP Latest Ref Rng & Units 10/24/2017 10/23/2017 10/23/2017  Glucose 65 - 99 mg/dL 113(H) - 130(H)  BUN 6 - 20 mg/dL <5(L) - 5(L)  Creatinine 0.61 - 1.24 mg/dL 0.63 - 0.60(L)  Sodium 135 - 145 mmol/L 137 - 136  Potassium 3.5 - 5.1 mmol/L 4.0 2.8(L) 2.6(LL)  Chloride 101 - 111 mmol/L 102 - 96(L)  CO2 22 - 32 mmol/L 27 - 31  Calcium 8.9 - 10.3 mg/dL 8.2(L) - 7.9(L)   Phosphorus: <1.0 Magnesium: 2.0  Assessment: Patient is a 52yo male admitted for abdominal pain and vomiting times 5 days. Presents with hypokalemia with K of 2.5. Initially started on IV fluids with 46mEq of KCl at 174ml/hr. Pharmacy consulted for electrolyte management.  Goal of Therapy:  Maintain electrolytes within range.  Plan:  [place] Phos still <1.  Will order NaPhos 68mmol IV once and recheck Phosphorus with AM labs.   Pernell Dupre, PharmD, BCPS Clinical Pharmacist 10/24/2017 8:07 PM

## 2017-10-24 NOTE — Progress Notes (Signed)
MD made aware of phosphorus level less than 1

## 2017-10-24 NOTE — Progress Notes (Addendum)
Initial Nutrition Assessment  DOCUMENTATION CODES:   Not applicable  INTERVENTION:   Ensure Enlive po BID, each supplement provides 350 kcal and 20 grams of protein  MVI daily  Thiamine 100mg  daily  Folic acid 1mg  daily  Magic cup TID with meals, each supplement provides 290 kcal and 9 grams of protein  NUTRITION DIAGNOSIS:   Inadequate oral intake related to acute illness, other (see comment)(etoh abuse, dysphagia ) as evidenced by per patient/family report.  GOAL:   Patient will meet greater than or equal to 90% of their needs  MONITOR:   Diet advancement, Labs, Weight trends, I & O's  REASON FOR ASSESSMENT:   Malnutrition Screening Tool    ASSESSMENT:   52 year old male patient with history of alcohol abuse, hypothyroidism, varicose veins, B12 deficiency presented to the emergency room with abdominal discomfort, nausea vomiting.   Unable to see pt today x 2 visits. Per chart, pt with h/o etoh abuse; drinks 1 pint tequila daily to help relieve PTSD symptoms. Per chart, pt has been vomiting for the past 2 weeks and unable to eat or drink anything. Pt also reports no BM in two weeks. Pt reports h/o EGD and GI ulcers. Gallbladder sludge seen on ultrasound; recommended HIDA scan showed reduced gallbladder ejection fraction. Per MD note, patient needs cholecystectomy. Pt s/p EGD today; noted to have gastritis and duodenitis; biopsies taken.  Per chart, pt has lost 22lbs(15%) over the past 8 months; this is significant given the pt's history. Pt likely with severe malnutrition given his h/o etoh abuse but unable to diagnose at this time. RD will order supplements when diet advanced. Pt  Reports difficulty swallowing; on PPIs. Pt ate 100% of his supper last night prior to being NPO. Pt with low K and P which is being supplemented. Pt likely at high refeeding risk; recommend monitor K, Mg, and P for at least 3 days. Pharmacy managing electrolytes.   Medications reviewed and  include: lovenox, folic acid, synthroid, MVI, protonix, thiamine  Labs reviewed: K 4.0 wnl, BUN <5(L), Ca 8.2(L), P <1.0(L), Mg 2.0 wnl  Unable to complete Nutrition-Focused physical exam at this time.   Diet Order:  Diet NPO time specified  EDUCATION NEEDS:   Not appropriate for education at this time  Skin:  Reviewed RN Assessment  Last BM:  4/2  Height:   Ht Readings from Last 1 Encounters:  10/22/17 5\' 5"  (1.651 m)    Weight:   Wt Readings from Last 1 Encounters:  10/24/17 124 lb 11.2 oz (56.6 kg)    Ideal Body Weight:  61.8 kg  BMI:  Body mass index is 20.75 kg/m.  Estimated Nutritional Needs:   Kcal:  1700-2000kcal/day   Protein:  73-84g/day   Fluid:  >1.4L/day   Koleen Distance MS, RD, LDN Pager #[phone] After Hours Pager: [phone]

## 2017-10-24 NOTE — Progress Notes (Signed)
[facility] CONSULT NOTE - Electrolyte Management   Pharmacy Consult for Electrolyte Management Indication: Electrolyte Abnormalities  No Known Allergies  Patient Measurements: Height: 5\' 5"  (165.1 cm) Weight: 124 lb 11.2 oz (56.6 kg) IBW/kg (Calculated) : 61.5 Adjusted Body Weight:    Vital Signs: Temp: 98.6 F (37 C) (04/04 0815) Temp Source: Oral (04/04 0815) BP: 114/81 (04/04 0815) Pulse Rate: 89 (04/04 0815) Intake/Output from previous day: 04/03 0701 - 04/04 0700 In: 240 [P.O.:240] Out: -  Intake/Output from this shift: No intake/output data recorded.  Labs: BMP Latest Ref Rng & Units 10/24/2017 10/23/2017 10/23/2017  Glucose 65 - 99 mg/dL 113(H) - 130(H)  BUN 6 - 20 mg/dL <5(L) - 5(L)  Creatinine 0.61 - 1.24 mg/dL 0.63 - 0.60(L)  Sodium 135 - 145 mmol/L 137 - 136  Potassium 3.5 - 5.1 mmol/L 4.0 2.8(L) 2.6(LL)  Chloride 101 - 111 mmol/L 102 - 96(L)  CO2 22 - 32 mmol/L 27 - 31  Calcium 8.9 - 10.3 mg/dL 8.2(L) - 7.9(L)   Phosphorus: <1.0 Magnesium: 2.0  Assessment: Patient is a 52yo male admitted for abdominal pain and vomiting times 5 days. Presents with hypokalemia with K of 2.5. Initially started on IV fluids with 33mEq of KCl at 150ml/hr. Pharmacy consulted for electrolyte management.  Goal of Therapy:  Maintain electrolytes within range.  Plan:  Will order NaPhos 46mmol IV once and recheck Phosphorus at 18:00. Continue with current fluid order. Will follow up on AM labs.  Paulina Fusi, PharmD, BCPS 10/24/2017 1:30 PM

## 2017-10-25 ENCOUNTER — Inpatient Hospital Stay: Payer: Medicare Other

## 2017-10-25 ENCOUNTER — Encounter: Payer: Self-pay | Admitting: Gastroenterology

## 2017-10-25 LAB — COMPREHENSIVE METABOLIC PANEL
ALT: 35 U/L (ref 17–63)
AST: 145 U/L — ABNORMAL HIGH (ref 15–41)
Albumin: 2.8 g/dL — ABNORMAL LOW (ref 3.5–5.0)
Alkaline Phosphatase: 88 U/L (ref 38–126)
Anion gap: 7 (ref 5–15)
BUN: <5 mg/dL — ABNORMAL LOW (ref 6–20)
CO2: 25 mmol/L (ref 22–32)
Calcium: 7.4 mg/dL — ABNORMAL LOW (ref 8.9–10.3)
Chloride: 108 mmol/L (ref 101–111)
Creatinine, Ser: 0.73 mg/dL (ref 0.61–1.24)
GFR calc Af Amer: >60 mL/min (ref >60)
GFR calc non Af Amer: >60 mL/min (ref >60)
Glucose, Bld: 118 mg/dL — ABNORMAL HIGH (ref 65–99)
Potassium: 4.7 mmol/L (ref 3.5–5.1)
Sodium: 140 mmol/L (ref 135–145)
Total Bilirubin: 1.8 mg/dL — ABNORMAL HIGH (ref 0.3–1.2)
Total Protein: 6.2 g/dL — ABNORMAL LOW (ref 6.5–8.1)

## 2017-10-25 LAB — PHOSPHORUS: Phosphorus: 4.7 mg/dL — ABNORMAL HIGH (ref 2.5–4.6)

## 2017-10-25 MED ORDER — METOPROLOL TARTRATE 5 MG/5ML IV SOLN
2.5000 mg | INTRAVENOUS | Status: DC | PRN
  Administered 2017-10-25: 2.5 mg via INTRAVENOUS
  Filled 2017-10-25: qty 5

## 2017-10-25 MED ORDER — POTASSIUM CHLORIDE IN NACL 20-0.9 MEQ/L-% IV SOLN
INTRAVENOUS | Status: DC
  Administered 2017-10-25 – 2017-10-27 (×4): via INTRAVENOUS
  Filled 2017-10-25 (×8): qty 1000

## 2017-10-25 MED ORDER — SODIUM CHLORIDE 0.9 % IV BOLUS
1000.0000 mL | Freq: Once | INTRAVENOUS | Status: AC
  Administered 2017-10-25: 1000 mL via INTRAVENOUS

## 2017-10-25 NOTE — Anesthesia Postprocedure Evaluation (Signed)
Anesthesia Post Note  Patient: Ivan Hamilton  Procedure(s) Performed: ESOPHAGOGASTRODUODENOSCOPY (EGD) WITH PROPOFOL (N/A )  Patient location during evaluation: Endoscopy Anesthesia Type: General Level of consciousness: awake and alert Pain management: pain level controlled Vital Signs Assessment: post-procedure vital signs reviewed and stable Respiratory status: spontaneous breathing, nonlabored ventilation and respiratory function stable Cardiovascular status: blood pressure returned to baseline and stable Postop Assessment: no apparent nausea or vomiting Anesthetic complications: no     Last Vitals:  Vitals:   10/25/17 0357 10/25/17 1050  BP: 100/74 (!) 85/66  Pulse: 87 95  Resp: 18 18  Temp: 37.2 C 37.1 C  SpO2: 98% 100%    Last Pain:  Vitals:   10/25/17 1050  TempSrc:   PainSc: 0-No pain                 Alphonsus Sias

## 2017-10-25 NOTE — Progress Notes (Addendum)
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  Neurology consult placed.spoke to patients daughter.

## 2017-10-25 NOTE — Progress Notes (Addendum)
[place] at [place] NAME: Ivan Hamilton    MR#:  [id number]  DATE OF BIRTH:  July 11, 1966  He had  hallucination last night, still going through withdrawal symptoms, continue Ativan IV.  Still had epigastric pain and some nausea.  Tolerating clear liquids and wants to eat regular food.  Family is at bedside.  Continues to have dizziness.  CHIEF COMPLAINT:   Chief Complaint  Patient presents with  . Weakness  according patient's daughter patient has been feeling weak, passed out at least 2-3 times since Saturday.  Not eating because of abdominal pain mainly epigastric area and also right upper quadrant associated with nausea, vomiting.  Patient was a heavy drinker and he used to drink at least 1 pint of tequila every day but recently for the past 1 week he could not keep anything down so he stopped.  He had  history of PTSD, because of insurance problems he could not get any psychiatric help and also continue to drink to help with PTSD.  REVIEW OF SYSTEMS:    Review of Systems  Constitutional: Negative for chills, fever and weight loss.  HENT: Negative for hearing loss.   Eyes: Negative for blurred vision, double vision and photophobia.  Respiratory: Negative for cough, hemoptysis and shortness of breath.   Cardiovascular: Negative for palpitations, orthopnea and leg swelling.  Gastrointestinal: Positive for abdominal pain, heartburn, nausea and vomiting. Negative for diarrhea.  Genitourinary: Negative for dysuria and urgency.  Musculoskeletal: Negative for myalgias and neck pain.  Skin: Negative for rash.  Neurological: Negative for dizziness, focal weakness, seizures, weakness and headaches.  Psychiatric/Behavioral: Negative for memory loss. The patient does not have insomnia.     Nutrition:  n.p.o. Tolerating Diet: Tolerating PT:      DRUG ALLERGIES:  No Known Allergies  VITALS:  Blood pressure (!) 85/66, pulse 95,  temperature 98.7 F (37.1 C), resp. rate 18, height 5\' 5"  (1.651 m), weight 56.6 kg (124 lb 11.2 oz), SpO2 100 %.  PHYSICAL EXAMINATION:   Physical Exam  GENERAL:  52 y.o.-year-old patient lying in the bed with no acute distress.  EYES: Pupils equal, round, reactive to light . No scleral icterus. Extraocular muscles intact.  HEENT: Head atraumatic, normocephalic. Oropharynx and nasopharynx clear.  NECK:  Supple, no jugular venous distention. No thyroid enlargement, no tenderness.  LUNGS: Normal breath sounds bilaterally, no wheezing, rales,rhonchi or crepitation. No use of accessory muscles of respiration.  CARDIOVASCULAR: S1, S2 normal. No murmurs, rubs, or gallops.  ABDOMEN: epiGastric tenderness present. EXTREMITIES: No pedal edema, cyanosis, or clubbing.  NEUROLOGIC: Cranial nerves II through XII are intact. Muscle strength 5/5 in all extremities. Sensation intact. Gait not checked.  PSYCHIATRIC: The patient is alert and oriented x 3.  SKIN: No obvious rash, lesion, or ulcer.    LABORATORY PANEL:   CBC Recent Labs  Lab 10/23/17 0715  WBC 4.8  HGB 9.7*  HCT 28.9*  PLT 161   ------------------------------------------------------------------------------------------------------------------  Chemistries  Recent Labs  Lab 10/24/17 0734 10/25/17 0328  NA 137 140  K 4.0 4.7  CL 102 108  CO2 27 25  GLUCOSE 113* 118*  BUN <5* <5*  CREATININE 0.63 0.73  CALCIUM 8.2* 7.4*  MG 2.0  --   AST  --  145*  ALT  --  35  ALKPHOS  --  88  BILITOT  --  1.8*   ------------------------------------------------------------------------------------------------------------------  Cardiac Enzymes No results for input(s): TROPONINI in the  last 168 hours. ------------------------------------------------------------------------------------------------------------------  RADIOLOGY:  Nm Hepato W/eject Fract  Result Date: 10/23/2017 CLINICAL DATA:  Right upper quadrant pain and abnormal  gallbladder ultrasound EXAM: NUCLEAR MEDICINE HEPATOBILIARY IMAGING WITH GALLBLADDER EF TECHNIQUE: Sequential images of the abdomen were obtained out to 60 minutes following intravenous administration of radiopharmaceutical. After slow intravenous infusion of 1.12 micrograms Cholecystokinin, gallbladder ejection fraction was determined. RADIOPHARMACEUTICALS:  4.52 mCi Tc-51m Choletec IV COMPARISON:  Ultrasound from the previous day. FINDINGS: Prompt uptake and biliary excretion of activity by the liver is seen. Gallbladder activity is visualized, consistent with patency of cystic duct. Biliary activity passes into small bowel, consistent with patent common bile duct. Calculated gallbladder ejection fraction is 21%. (At 60 min, normal ejection fraction is greater than 40%.) IMPRESSION: Normal uptake and excretion of biliary tracer. The gallbladder is well visualized documenting cystic duct patency. Reduced gallbladder ejection fraction of 21%. This may be related to a more chronic component of cholecystitis. Electronically Signed   By: Inez Catalina M.D.   On: 10/23/2017 16:43     ASSESSMENT AND PLAN:   Principal Problem:   PTSD (post-traumatic stress disorder) Active Problems:   Alcohol withdrawal (HCC)   Alcohol use disorder, severe, dependence (HCC)   Hypokalemia   Intractable vomiting with nausea   Duodenitis   Gastritis without bleeding   #1 .recurrent syncope likely due to orthostatic hypotension: Patient passed out multiple times since Saturday, patient does have orthostatic changes. Echocardiogram showed EF more than 55%.  Syncope likely secondary to hypotension with poor p.o. intake for last 1 week. Because of dizziness ordered ultrasound of carotids.  Also get MRI of the brain.  He is hypotensive again and receiving 1 L of fluid bolus. 2/ hypokalemia secondary to poor p.o. intake for the last few days secondary to epigastric pain, nausea, right upper quadrant pain.  Potassium improved.   The patient had severe hypo phosphatemia pharmacy consulted for  electrolyte management 3.  Right upper quadrant pain associated with episodes of vomiting, nausea unable to keep anything down: Gallbladder sludge seen on ultrasound recommended scan showed reduced gallbladder ejection fraction, patient needs cholecystectomy,, seen by surgery, patient can have only as an outpatient. 4.  History of heavy alcohol abuse with epigastric pain, nausea, vomiting, poor p.o. intake: EGD showing duodenitis, gastritis, continue PPI, sucralfate continue IV PPIs, IV nausea medicines and see how he tolerates regular diet.  5.  History of alcohol abuse, withdrawal at this time continue Ativan IV.  Still shaky and had hallucination last night.  6.  History of PTSD, appreciate psychiatric consult.   7.  Hypothyroidism: Continue Synthyroid.  Continue IV PPIs, monitor for withdrawal symptoms, follow EGD results, appreciate GI following, consult surgery. Talk with patient's daughter and also wife.  Patient needs at least 1-2 more days of IV Ativan for withdrawal symptoms. All the records are reviewed and case discussed with Care Management/Social Workerr. Management plans discussed with the patient, family and they are in agreement.  CODE STATUS: Full code  TOTAL TIME TAKING CARE OF THIS PATIENT: 35 minutes.   POSSIBLE D/C IN 2-3 DAYS, DEPENDING ON CLINICAL CONDITION. More than 50% of the time spent in coordination of the care, discussing medical condition and also lab test with patient's daughter.  Epifanio Lesches M.D on 10/25/2017 at 12:08 PM  Between 7am to 6pm - Pager - [phone]  After 6pm go to [email or web address] - password [email or web address] [facility]  [facility]  Office  [phone]  CC: Primary care physician; [name],  MD

## 2017-10-25 NOTE — Progress Notes (Signed)
[facility] CONSULT NOTE - Electrolyte Management   Pharmacy Consult for Electrolyte Management Indication: Electrolyte Abnormalities  No Known Allergies  Patient Measurements: Height: 5\' 5"  (165.1 cm) Weight: 124 lb 11.2 oz (56.6 kg) IBW/kg (Calculated) : 61.5 Adjusted Body Weight:    Vital Signs: Temp: 98.7 F (37.1 C) (04/05 1050) Temp Source: Oral (04/05 0357) BP: 91/68 (04/05 1200) Pulse Rate: 95 (04/05 1050) Intake/Output from previous day: 04/04 0701 - 04/05 0700 In: [place] [P.O.:600; I.V.:1062] Out: 200 [Urine:200] Intake/Output from this shift: Total I/O In: 360 [P.O.:360] Out: 1400 [Urine:1400]  Labs: BMP Latest Ref Rng & Units 10/25/2017 10/24/2017 10/23/2017  Glucose 65 - 99 mg/dL 118(H) 113(H) -  BUN 6 - 20 mg/dL <5(L) <5(L) -  Creatinine 0.61 - 1.24 mg/dL 0.73 0.63 -  Sodium 135 - 145 mmol/L 140 137 -  Potassium 3.5 - 5.1 mmol/L 4.7 4.0 2.8(L)  Chloride 101 - 111 mmol/L 108 102 -  CO2 22 - 32 mmol/L 25 27 -  Calcium 8.9 - 10.3 mg/dL 7.4(L) 8.2(L) -   Phosphorus: 4.7 Magnesium: 2.0  Assessment: Patient is a 52yo male admitted for abdominal pain and vomiting times 5 days. Presents with hypokalemia with K of 2.5. Initially started on IV fluids with 6mEq of KCl at 154ml/hr. Pharmacy consulted for electrolyte management.  Goal of Therapy:  Maintain electrolytes within range.  Plan:  Electrolytes are within range today. Will decrease KCl to 40mEq per liter of IV fluid as K is now approaching the upper end of normal range. Will follow up on AM labs.  Paulina Fusi, PharmD, BCPS 10/25/2017 2:01 PM

## 2017-10-25 NOTE — Consult Note (Signed)
[facility] Consult   Reason for Consult: Follow-up note for 53 year old man with alcohol abuse and PTSD. Referring Physician: Vianne Bulls Patient Identification: Ivan Hamilton MRN:  [id number] Principal Diagnosis: PTSD (post-traumatic stress disorder) Diagnosis:   Patient Active Problem List   Diagnosis Date Noted  . Duodenitis [K29.80]   . Gastritis without bleeding [K29.70]   . Hypokalemia [E87.6] 10/22/2017  . Intractable vomiting with nausea [R11.2]   . Alcohol withdrawal delirium ([place]) [F10.231]   . Alcohol withdrawal ([place]) [F10.239] 09/02/2015  . Alcohol use disorder, severe, dependence ([place]) [F10.20] 09/02/2015  . 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[place]) [F10.229, F10.24, F32.89] 09/02/2015  . PTSD (post-traumatic stress disorder) [F43.10] 09/01/2015  . Hypothyroid [E03.9] 09/01/2015  . Gastric reflux [K21.9] 09/01/2015  . DDD (degenerative disc disease), lumbosacral [M51.37] 09/01/2015    Total Time spent with patient: 30 minutes  Subjective:   Ivan Hamilton is a 52 y.o. male patient admitted with "I am still shaky".  HPI: Patient seen chart reviewed.  See previous notes.  Patient has alcohol abuse and is probably still having some degree of withdrawal.  Says that he had some frightening confused episode today when he went for an MRI otherwise though feeling a little bit better.  Able to eat.  Patient was able to hold a lucid conversation with me and did not seem to be responding to internal stimuli.  Tolerating medicine without difficulty.  He specifically asks me for help getting into substance abuse treatment.  Past Psychiatric History: Long-standing struggle with posttraumatic stress disorder and alcohol abuse with alcohol abuse causing multiple severe complications  Risk to Self: Is patient at risk for suicide?: No Risk to Others:   Prior Inpatient Therapy:   Prior Outpatient  Therapy:    Past Medical History:  Past Medical History:  Diagnosis Date  . Alcohol withdrawal ([place])   . B12 deficiency   . Chronic pain   . Hypothyroidism   . Varicose veins     Past Surgical History:  Procedure Laterality Date  . ESOPHAGOGASTRODUODENOSCOPY (EGD) WITH PROPOFOL N/A 10/24/2017   Procedure: ESOPHAGOGASTRODUODENOSCOPY (EGD) WITH PROPOFOL;  Surgeon: [name], MD;  Location: [facility] ENDOSCOPY;  Service: Endoscopy;  Laterality: N/A;  . LEG SURGERY     Family History: History reviewed. No pertinent family history. Family Psychiatric  History: Positive for alcohol abuse Social History:  Social History   Substance and Sexual Activity  Alcohol Use Yes   Comment: daily- 1 pint of vodka     Social History   Substance and Sexual Activity  Drug Use No    Social History   Socioeconomic History  . Marital status: Married    Spouse name: Not on file  . Number of children: Not on file  . Years of education: Not on file  . Highest education level: Not on file  Occupational History  . Not on file  Social Needs  . Financial resource strain: Not on file  . Food insecurity:    Worry: Not on file    Inability: Not on file  . Transportation needs:    Medical: Not on file    Non-medical: Not on file  Tobacco Use  . Smoking status: Never Smoker  . Smokeless tobacco: Never Used  Substance and Sexual Activity  . Alcohol use: Yes    Comment: daily- 1 pint of vodka  . Drug use: No  . Sexual activity: Not on file  Lifestyle  . Physical  activity:    Days per week: Not on file    Minutes per session: Not on file  . Stress: Not on file  Relationships  . Social connections:    Talks on phone: Not on file    Gets together: Not on file    Attends religious service: Not on file    Active member of club or organization: Not on file    Attends meetings of clubs or organizations: Not on file    Relationship status: Not on file  Other Topics Concern  . Not on file  Social  History Narrative  . Not on file   Additional Social History:    Allergies:  No Known Allergies  Labs:  Results for orders placed or performed during the hospital encounter of 10/22/17 (from the past 48 hour(s))  Basic metabolic panel     Status: Abnormal   Collection Time: 10/24/17  7:34 AM  Result Value Ref Range   Sodium 137 135 - 145 mmol/L   Potassium 4.0 3.5 - 5.1 mmol/L   Chloride 102 101 - 111 mmol/L   CO2 27 22 - 32 mmol/L   Glucose, Bld 113 (H) 65 - 99 mg/dL   BUN <5 (L) 6 - 20 mg/dL   Creatinine, Ser 0.63 0.61 - 1.24 mg/dL   Calcium 8.2 (L) 8.9 - 10.3 mg/dL   GFR calc non Af Amer >60 >60 mL/min   GFR calc Af Amer >60 >60 mL/min    Comment: (NOTE) The eGFR has been calculated using the CKD EPI equation. This calculation has not been validated in all clinical situations. eGFR's persistently <60 mL/min signify possible Chronic Kidney Disease.    Anion gap 8 5 - 15    Comment: Performed at [facility], [address] [place], [place] [address]  Phosphorus     Status: Abnormal   Collection Time: 10/24/17  7:34 AM  Result Value Ref Range   Phosphorus <1.0 (LL) 2.5 - 4.6 mg/dL    Comment: CRITICAL RESULT CALLED TO, READ BACK BY AND VERIFIED WITH JESSICA CHRISTMAS AT 0834 10/24/17 DAS Performed at [facility], [address] [place], [place] [address]   Magnesium     Status: None   Collection Time: 10/24/17  7:34 AM  Result Value Ref Range   Magnesium 2.0 1.7 - 2.4 mg/dL    Comment: Performed at [facility], [address] [place], [place] [address]  Phosphorus     Status: Abnormal   Collection Time: 10/24/17  7:22 PM  Result Value Ref Range   Phosphorus <1.0 (LL) 2.5 - 4.6 mg/dL    Comment: CRITICAL RESULT CALLED TO, READ BACK BY AND VERIFIED WITH KAT MURRAY 10/24/17 1959 KLW Performed at [facility], [address] [place], [place] [address]   Comprehensive metabolic panel     Status: Abnormal   Collection Time:  10/25/17  3:28 AM  Result Value Ref Range   Sodium 140 135 - 145 mmol/L   Potassium 4.7 3.5 - 5.1 mmol/L   Chloride 108 101 - 111 mmol/L   CO2 25 22 - 32 mmol/L   Glucose, Bld 118 (H) 65 - 99 mg/dL   BUN <5 (L) 6 - 20 mg/dL   Creatinine, Ser 0.73 0.61 - 1.24 mg/dL   Calcium 7.4 (L) 8.9 - 10.3 mg/dL   Total Protein 6.2 (L) 6.5 - 8.1 g/dL   Albumin 2.8 (L) 3.5 - 5.0 g/dL   AST 145 (H) 15 -  41 U/L   ALT 35 17 - 63 U/L   Alkaline Phosphatase 88 38 - 126 U/L   Total Bilirubin 1.8 (H) 0.3 - 1.2 mg/dL   GFR calc non Af Amer >60 >60 mL/min   GFR calc Af Amer >60 >60 mL/min    Comment: (NOTE) The eGFR has been calculated using the CKD EPI equation. This calculation has not been validated in all clinical situations. eGFR's persistently <60 mL/min signify possible Chronic Kidney Disease.    Anion gap 7 5 - 15    Comment: Performed at [facility], [address] [place], [place] [address]  Phosphorus     Status: Abnormal   Collection Time: 10/25/17  3:28 AM  Result Value Ref Range   Phosphorus 4.7 (H) 2.5 - 4.6 mg/dL    Comment: Performed at [facility], [address] [place], [place] [address]    Current Facility-Administered Medications  Medication Dose Route Frequency Provider Last Rate Last Dose  . 0.9 % NaCl with KCl 20 mEq/ L  infusion   Intravenous Continuous Epifanio Lesches, MD 125 mL/hr at 10/25/17 1525    . acetaminophen (TYLENOL) tablet 650 mg  650 mg Oral Q6H PRN [name], MD   650 mg at 10/24/17 2157   Or  . acetaminophen (TYLENOL) suppository 650 mg  650 mg Rectal Q6H PRN [name], MD      . busPIRone (BUSPAR) tablet 15 mg  15 mg Oral BID [name], MD   15 mg at 10/25/17 3235  . enoxaparin (LOVENOX) injection 40 mg  40 mg Subcutaneous q1800 [name], MD   40 mg at 10/25/17 1720  . feeding supplement (ENSURE ENLIVE) (ENSURE ENLIVE) liquid 237 mL  237 mL Oral BID BM Epifanio Lesches, MD   237 mL at 10/25/17 1542  . FLUoxetine  (PROZAC) capsule 20 mg  20 mg Oral Daily Dereon Corkery, Madie Reno, MD   20 mg at 10/25/17 0939  . folic acid (FOLVITE) tablet 1 mg  1 mg Oral Daily [name], MD   1 mg at 10/25/17 0939  . HYDROmorphone (DILAUDID) injection 0.5 mg  0.5 mg Intravenous Q4H PRN [name], MD   0.5 mg at 10/25/17 1951  . levothyroxine (SYNTHROID, LEVOTHROID) tablet 100 mcg  100 mcg Oral QAC breakfast [name], MD   100 mcg at 10/25/17 [phone]  . LORazepam (ATIVAN) injection 0-4 mg  0-4 mg Intravenous Q12H [name], MD   2 mg at 10/25/17 1525  . metoprolol tartrate (LOPRESSOR) injection 2.5 mg  2.5 mg Intravenous Q4H PRN Epifanio Lesches, MD   2.5 mg at 10/25/17 1719  . multivitamin with minerals tablet 1 tablet  1 tablet Oral Daily [name], MD   1 tablet at 10/25/17 [phone]  . ondansetron (ZOFRAN) tablet 4 mg  4 mg Oral Q6H PRN [name], MD       Or  . ondansetron [facility]) injection 4 mg  4 mg Intravenous Q6H PRN [name], MD   4 mg at 10/22/17 2258  . pantoprazole (PROTONIX) injection 40 mg  40 mg Intravenous Q12H [name], MD   40 mg at 10/25/17 4270  . senna-docusate (Senokot-S) tablet 1 tablet  1 tablet Oral QHS PRN [name], MD   1 tablet at 10/22/17 2149  . thiamine (VITAMIN B-1) tablet 100 mg  100 mg Oral Daily [name], MD   100 mg at 10/25/17 6237   Or  . thiamine (B-1) injection 100 mg  100 mg Intravenous Daily [name], MD   100 mg at 10/22/17 1346    Musculoskeletal: Strength & Muscle Tone: decreased Gait & Station: unsteady Patient leans: N/A  Psychiatric Specialty Exam: Physical Exam  Nursing note and vitals reviewed. Constitutional: He appears well-developed.  HENT:  Head: Normocephalic and atraumatic.  Eyes: Pupils are equal, round, and reactive to light. Conjunctivae are normal.  Neck: Normal range of motion.  Cardiovascular: Regular rhythm and normal heart sounds.  Respiratory: Effort normal. No respiratory distress.  GI: Soft.  Musculoskeletal: Normal  range of motion.  Neurological: He is alert.  Skin: Skin is warm and dry.  Psychiatric: Judgment normal. His affect is blunt. His speech is delayed. He is slowed. Thought content is not paranoid. He expresses no homicidal and no suicidal ideation. He exhibits abnormal recent memory.    Review of Systems  Constitutional: Negative.   HENT: Negative.   Eyes: Negative.   Respiratory: Negative.   Cardiovascular: Negative.   Gastrointestinal: Negative.   Musculoskeletal: Negative.   Skin: Negative.   Neurological: Negative.   Psychiatric/Behavioral: Positive for memory loss and substance abuse. Negative for depression, hallucinations and suicidal ideas. The patient is nervous/anxious and has insomnia.     Blood pressure 102/79, pulse 96, temperature 99.2 F (37.3 C), temperature source Oral, resp. rate 17, height '5\' 5"'  (1.651 m), weight 56.6 kg (124 lb 11.2 oz), SpO2 99 %.Body mass index is 20.75 kg/m.  General Appearance: Casual  Eye Contact:  Fair  Speech:  Slow  Volume:  Decreased  Mood:  Anxious  Affect:  Constricted  Thought Process:  Goal Directed  Orientation:  Full (Time, Place, and Person)  Thought Content:  Logical  Suicidal Thoughts:  No  Homicidal Thoughts:  No  Memory:  Immediate;   Fair Recent;   Fair Remote;   Fair  Judgement:  Fair  Insight:  Fair  Psychomotor Activity:  Normal  Concentration:  Concentration: Fair  Recall:  [employer] of Knowledge:  Fair  Language:  Fair  Akathisia:  No  Handed:  Right  AIMS (if indicated):     Assets:  Desire for Improvement Financial Resources/Insurance Housing Resilience Social Support  ADL's:  Impaired  Cognition:  Impaired,  Mild  Sleep:        Treatment Plan Summary: Plan Patient is improving.  More lucid and alert than yesterday.  Still very withdrawn and quiet however no sign of delirium or psychosis.  Continue current medication management.  I have put in a social work consult to see if there is any way that  help can be provided to refer him to substance abuse treatment.  He specifically asked me to [facility].  His daughter had mentioned that possibly his insurance would cover other facilities as well.  I will asked psychiatry over the weekend to follow-up with him.  Disposition: No evidence of imminent risk to self or others at present.   Patient does not meet criteria for psychiatric inpatient admission. Supportive therapy provided about ongoing stressors.  Alethia Berthold, MD 10/25/2017 8:56 PM

## 2017-10-25 NOTE — Progress Notes (Signed)
Temp 100.8 md was notified of hr 130s. Will give tylenol and monitor.

## 2017-10-25 NOTE — Plan of Care (Signed)
  Problem: Coping: Goal: Level of anxiety will decrease Outcome: Progressing   Problem: Pain Managment: Goal: General experience of comfort will improve Outcome: Progressing   

## 2017-10-25 NOTE — Clinical Social Work Note (Signed)
CSW received consult that patient needed some information on alcohol abuse resources.  CSW met with patient he was not feeling very well, but he was able to converse with CSW.  Patient was given a list of resources for substance abuse and mental health treatment.  Patient was appreciative of information given to him.  CSW to sign off please reconsult if other social work needs arise.  Jones Broom. [place], MSW, [place]  10/25/2017 5:24 PM

## 2017-10-26 DIAGNOSIS — F1020 Alcohol dependence, uncomplicated: Secondary | ICD-10-CM

## 2017-10-26 DIAGNOSIS — I6381 Other cerebral infarction due to occlusion or stenosis of small artery: Secondary | ICD-10-CM

## 2017-10-26 LAB — PHOSPHORUS: Phosphorus: 2.9 mg/dL (ref 2.5–4.6)

## 2017-10-26 LAB — BASIC METABOLIC PANEL
Anion gap: 5 (ref 5–15)
BUN: <5 mg/dL — ABNORMAL LOW (ref 6–20)
CO2: 25 mmol/L (ref 22–32)
Calcium: 7.4 mg/dL — ABNORMAL LOW (ref 8.9–10.3)
Chloride: 107 mmol/L (ref 101–111)
Creatinine, Ser: 0.59 mg/dL — ABNORMAL LOW (ref 0.61–1.24)
GFR calc Af Amer: >60 mL/min (ref >60)
GFR calc non Af Amer: >60 mL/min (ref >60)
Glucose, Bld: 99 mg/dL (ref 65–99)
Potassium: 4 mmol/L (ref 3.5–5.1)
Sodium: 137 mmol/L (ref 135–145)

## 2017-10-26 LAB — HEMOGLOBIN A1C
Hgb A1c MFr Bld: 5.1 % (ref 4.8–5.6)
Mean Plasma Glucose: 99.67 mg/dL

## 2017-10-26 LAB — MAGNESIUM: Magnesium: 1.4 mg/dL — ABNORMAL LOW (ref 1.7–2.4)

## 2017-10-26 MED ORDER — TRAZODONE HCL 50 MG PO TABS: 50.0000 mg | ORAL_TABLET | Freq: Every day | ORAL | Status: DC

## 2017-10-26 MED ORDER — MAGNESIUM SULFATE 4 GM/100ML IV SOLN
4.0000 g | Freq: Once | INTRAVENOUS | Status: AC
  Administered 2017-10-26: 4 g via INTRAVENOUS
  Filled 2017-10-26: qty 100

## 2017-10-26 MED ORDER — LORAZEPAM 2 MG PO TABS
2.0000 mg | ORAL_TABLET | Freq: Four times a day (QID) | ORAL | Status: DC | PRN
  Administered 2017-10-26 – 2017-10-28 (×2): 2 mg via ORAL
  Filled 2017-10-26 (×2): qty 1

## 2017-10-26 MED ORDER — QUETIAPINE FUMARATE 25 MG PO TABS
50.0000 mg | ORAL_TABLET | Freq: Every day | ORAL | Status: DC
  Administered 2017-10-26 – 2017-10-27 (×2): 50 mg via ORAL
  Filled 2017-10-26 (×2): qty 2

## 2017-10-26 MED ORDER — CITALOPRAM HYDROBROMIDE 20 MG PO TABS
20.0000 mg | ORAL_TABLET | Freq: Every day | ORAL | Status: DC
  Administered 2017-10-26 – 2017-10-28 (×3): 20 mg via ORAL
  Filled 2017-10-26 (×3): qty 1

## 2017-10-26 NOTE — Plan of Care (Signed)
  Problem: Clinical Measurements: Goal: Diagnostic test results will improve Outcome: Progressing   

## 2017-10-26 NOTE — Clinical Social Work Note (Signed)
Clinical Social Work Assessment  Patient Details  Name: Ivan Hamilton MRN: [id number] Date of Birth: Dec 29, 1965  Date of referral:  10/26/17               Reason for consult:  Facility Placement                Permission sought to share information with:  Chartered certified accountant granted to share information::  Yes, Verbal Permission Granted  Name::        Agency::  [facility]  Relationship::     Contact Information:     Housing/Transportation Living arrangements for the past 2 months:  [place] of Information:  Patient, Medical Team Patient Interpreter Needed:  None Criminal Activity/Legal Involvement Pertinent to Current Situation/Hospitalization:  No - Comment as needed Significant Relationships:  Adult Children, Community Support, Spouse Lives with:  Spouse Do you feel safe going back to the place where you live?  Yes Need for family participation in patient care:  No (Coment)  Care giving concerns:  Patient is requesting referral to [place] Worker assessment / plan:  CSW met with the patient at bedside to discuss his request for "help getting treatment." The CSW inquired if the patient would prefer residential treatment as recommended or outpatient treatment. The patient stated that she would like to pursue residential treatment at [facility]. The patient gave verbal permission to send the referral.  The CSW has begun the referral process and expects an update as to admission or waitlist on Monday. CSW is following.  Employment status:  Retired Forensic scientist:  Medicare PT Recommendations:  Not assessed at this time Information / Referral to community resources:  Residential Substance Abuse Treatment Options([facility])  Patient/Family's Response to care:  The patient thanked the CSW.  Patient/Family's Understanding of and Emotional Response to Diagnosis, Current Treatment, and Prognosis:  The patient is in the  preparation stage of change and understands and agrees with the recommendation for residential substance use treatment for his alcohol use disorder.  Emotional Assessment Appearance:  Appears stated age Attitude/Demeanor/Rapport:  Lethargic Affect (typically observed):  Stable Orientation:  Oriented to Self, Oriented to Place, Oriented to Situation Alcohol / Substance use:  Alcohol Use Psych involvement (Current and /or in the community):  Yes (Comment)(Psychiatry is following during admission)  Discharge Needs  Concerns to be addressed:  Care Coordination, Mental Health Concerns, Substance Abuse Concerns, Discharge Planning Concerns Readmission within the last 30 days:  No Current discharge risk:  Psychiatric Illness, Substance Abuse Barriers to Discharge:  Continued Medical Work up   [employer], LCSW 10/26/2017, 2:44 PM

## 2017-10-26 NOTE — Progress Notes (Addendum)
[place] at [facility]                                                                                                                                                                                  Patient Demographics   Ivan Hamilton, is a 52 y.o. male, DOB - [birth year], DDU:[id number]  Admit date - 10/22/2017   Admitting Physician Saundra Shelling, MD  Outpatient Primary MD for the patient is Alexis Goodell, MD   LOS - 4  Subjective: Patient seen and evaluated by me today Has some shaking in the upper extremities Has nausea but no vomiting No complaints of any chest pain Abdominal pain improved   Review of Systems:   CONSTITUTIONAL: No documented fever. No fatigue, weakness. No weight gain, history of weight loss.  EYES: No blurry or double vision.  ENT: No tinnitus. No postnasal drip. No redness of the oropharynx.  RESPIRATORY: No cough, no wheeze, no hemoptysis. No dyspnea.  CARDIOVASCULAR: No chest pain. No orthopnea. No palpitations. No syncope.  GASTROINTESTINAL: Has nausea, no vomiting or diarrhea. No abdominal pain. No melena or hematochezia.  GENITOURINARY: No dysuria or hematuria.  ENDOCRINE: No polyuria or nocturia. No heat or cold intolerance.  HEMATOLOGY: No anemia. No bruising. No bleeding.  INTEGUMENTARY: No rashes. No lesions.  MUSCULOSKELETAL: No arthritis. No swelling. No gout.  Tremors upper extremities NEUROLOGIC: No numbness, tingling, or ataxia. No seizure-type activity.  PSYCHIATRIC: No anxiety. No insomnia. No ADD.    Vitals:   Vitals:   10/25/17 1737 10/25/17 1827 10/25/17 2049 10/26/17 0610  BP:  92/71 102/79 112/84  Pulse: (!) 108 (!) 109 96 73  Resp:  20 17   Temp:  99.2 F (37.3 C) 99.2 F (37.3 C) 98.2 F (36.8 C)  TempSrc:  Oral Oral Oral  SpO2:  100% 99% 100%  Weight:      Height:        Wt Readings from Last 3 Encounters:  10/24/17 56.6 kg (124 lb 11.2 oz)  09/01/17 74.8 kg (165 lb)  09/01/17  74.8 kg (165 lb)     Intake/Output Summary (Last 24 hours) at 10/26/2017 0957 Last data filed at 10/26/2017 0200 Gross per 24 hour  Intake 860 ml  Output 2000 ml  Net -1140 ml    Physical Exam:   GENERAL: Pleasant-appearing in no apparent distress.  HEAD, EYES, EARS, NOSE AND THROAT: Atraumatic, normocephalic. Extraocular muscles are intact. Pupils equal and reactive to light. Sclerae anicteric. No conjunctival injection. No oro-pharyngeal erythema.  NECK: Supple. There is no jugular venous distention. No bruits, no lymphadenopathy, no thyromegaly.  HEART: Regular rate and rhythm,. No murmurs, no rubs, no  clicks.  LUNGS: Clear to auscultation bilaterally. No rales or rhonchi. No wheezes.  ABDOMEN: Soft, flat, nontender, nondistended. Has good bowel sounds. No hepatosplenomegaly appreciated.  EXTREMITIES: No evidence of any cyanosis, clubbing, or peripheral edema.  +2 pedal and radial pulses bilaterally.  NEUROLOGIC: The patient is alert, awake, and oriented x3 with no focal motor or sensory deficits appreciated bilaterally.  SKIN: Moist and warm with no rashes appreciated.  Psych: Not anxious, depressed LN: No inguinal LN enlargement    Antibiotics   Anti-infectives (From admission, onward)   None      Medications   Scheduled Meds: . busPIRone  15 mg Oral BID  . enoxaparin (LOVENOX) injection  40 mg Subcutaneous q1800  . feeding supplement (ENSURE ENLIVE)  237 mL Oral BID BM  . FLUoxetine  20 mg Oral Daily  . folic acid  1 mg Oral Daily  . levothyroxine  100 mcg Oral QAC breakfast  . LORazepam  0-4 mg Intravenous Q12H  . multivitamin with minerals  1 tablet Oral Daily  . pantoprazole (PROTONIX) IV  40 mg Intravenous Q12H  . thiamine  100 mg Oral Daily   Or  . thiamine  100 mg Intravenous Daily   Continuous Infusions: . 0.9 % NaCl with KCl 20 mEq / L 125 mL/hr at 10/25/17 2350  . magnesium sulfate 1 - 4 g bolus IVPB 4 g (10/26/17 0856)   PRN Meds:.acetaminophen **OR**  acetaminophen, HYDROmorphone (DILAUDID) injection, metoprolol tartrate, ondansetron **OR** ondansetron (ZOFRAN) IV, senna-docusate   Data Review:   Micro Results Recent Results (from the past 240 hour(s))  CULTURE, BLOOD (ROUTINE X 2) w Reflex to ID Panel     Status: None (Preliminary result)   Collection Time: 10/25/17  5:01 PM  Result Value Ref Range Status   Specimen Description BLOOD RIGHT ANTECUBITAL  Final   Special Requests   Final    BOTTLES DRAWN AEROBIC AND ANAEROBIC Blood Culture adequate volume   Culture   Final    NO GROWTH < 24 HOURS Performed at [facility], [address] [place], [place] [address]    Report Status PENDING  Incomplete  CULTURE, BLOOD (ROUTINE X 2) w Reflex to ID Panel     Status: None (Preliminary result)   Collection Time: 10/25/17  5:11 PM  Result Value Ref Range Status   Specimen Description BLOOD BLOOD RIGHT HAND  Final   Special Requests NONE  Final   Culture   Final    NO GROWTH < 24 HOURS Performed at [facility], [address], [place],  [address]    Report Status PENDING  Incomplete    Radiology Reports Mr Brain Wo Contrast  Result Date: 10/25/2017 CLINICAL DATA:  Alcohol withdrawals.  Dizziness and syncope. EXAM: MRI HEAD WITHOUT CONTRAST TECHNIQUE: Multiplanar, multiecho pulse sequences of the brain and surrounding structures were obtained without intravenous contrast. COMPARISON:  CT HEAD April 19, 2016 FINDINGS: INTRACRANIAL CONTENTS: Subcentimeter reduced diffusion and T2 hyperintensity central pons without ADC abnormality. No reduced diffusion to suggest status epilepticus. No susceptibility artifact to suggest hemorrhage. Mild global parenchymal brain volume loss. No hydrocephalus. No suspicious parenchymal signal, masses, mass effect. No abnormal extra-axial fluid collections. No extra-axial masses. VASCULAR: Normal major intracranial vascular flow voids present at skull base. SKULL AND UPPER CERVICAL  SPINE: No abnormal sellar expansion. No suspicious calvarial bone marrow signal. Craniocervical junction maintained. SINUSES/ORBITS: The mastoid air-cells and included paranasal sinuses are well-aerated.The included ocular globes and orbital contents are non-suspicious.  OTHER: None. IMPRESSION: 1. Subacute central pontine small vessel infarct. 2. Stable mild parenchymal brain volume loss. Electronically Signed   By: Elon Alas M.D.   On: 10/25/2017 14:41   US Abdomen Complete  Result Date: 10/22/2017 CLINICAL DATA:  Abdominal pain for 2 weeks. EXAM: ABDOMEN ULTRASOUND COMPLETE COMPARISON:  CT abdomen pelvis dated November 02, 2015. FINDINGS: Gallbladder: Layering sludge. There are few linear intraluminal membranes. No pericholecystic fluid. No wall thickening visualized. No sonographic Murphy sign noted by sonographer. Common bile duct: Diameter: 4 mm, normal. Liver: No focal lesion identified. Diffusely increased in parenchymal echogenicity. Portal vein is patent on color Doppler imaging with normal direction of blood flow towards the liver. IVC: No abnormality visualized. Pancreas: Visualized portion unremarkable. Spleen: Size and appearance within normal limits. Right Kidney: Length: 10.8 cm. Echogenicity within normal limits. No mass or hydronephrosis visualized. Left Kidney: Length: 10.9 cm. Echogenicity within normal limits. No mass or hydronephrosis visualized. Abdominal aorta: No aneurysm visualized. Other findings: None. IMPRESSION: 1. Layering gallbladder sludge with a few linear intraluminal membranes which could reflect sloughing of the mucosa in the setting of acute cholecystitis. However, there is no other sonographic evidence of acute cholecystitis at this time. Correlate clinically and consider HIDA scan for further evaluation. 2. Hepatic steatosis. Electronically Signed   By: Titus Dubin M.D.   On: 10/22/2017 17:52   US Carotid Bilateral  Result Date: 10/25/2017 CLINICAL DATA:   Dizziness for the past 2 days.  Syncopal episode. EXAM: BILATERAL CAROTID DUPLEX ULTRASOUND TECHNIQUE: Pearline Cables scale imaging, color Doppler and duplex ultrasound were performed of bilateral carotid and vertebral arteries in the neck. COMPARISON:  None. FINDINGS: Criteria: Quantification of carotid stenosis is based on velocity parameters that correlate the residual internal carotid diameter with NASCET-based stenosis levels, using the diameter of the distal internal carotid lumen as the denominator for stenosis measurement. The following velocity measurements were obtained: RIGHT ICA:  71/26 cm/sec CCA:  71/24 cm/sec SYSTOLIC ICA/CCA RATIO:  1.1 ECA:  69 cm/sec LEFT ICA:  59/23 cm/sec CCA:  58/0 cm/sec SYSTOLIC ICA/CCA RATIO:  0.7 ECA:  74 cm/sec RIGHT CAROTID ARTERY: There is a minimal amount of hypoechoic plaque involving the origin and proximal aspects of the right internal carotid artery (image 22), not resulting in elevated peak systolic velocities within the interrogated course the right internal carotid artery to suggest a hemodynamically significant stenosis. RIGHT VERTEBRAL ARTERY:  Antegrade flow LEFT CAROTID ARTERY: There is a minimal amount of atherosclerotic plaque involving the origin and proximal aspects of the left internal carotid artery (image 54), not resulting in elevated peak systolic velocities within the interrogated course the left internal carotid artery to suggest a hemodynamically significant stenosis. LEFT VERTEBRAL ARTERY:  Antegrade flow IMPRESSION: Minimal amount of bilateral atherosclerotic plaque, not resulting in a hemodynamically significant stenosis within either internal carotid artery. Electronically Signed   By: Sandi Mariscal M.D.   On: 10/25/2017 15:38   Nm Hepato W/eject Fract  Result Date: 10/23/2017 CLINICAL DATA:  Right upper quadrant pain and abnormal gallbladder ultrasound EXAM: NUCLEAR MEDICINE HEPATOBILIARY IMAGING WITH GALLBLADDER EF TECHNIQUE: Sequential images of the  abdomen were obtained out to 60 minutes following intravenous administration of radiopharmaceutical. After slow intravenous infusion of 1.12 micrograms Cholecystokinin, gallbladder ejection fraction was determined. RADIOPHARMACEUTICALS:  4.52 mCi Tc-19m Choletec IV COMPARISON:  Ultrasound from the previous day. FINDINGS: Prompt uptake and biliary excretion of activity by the liver is seen. Gallbladder activity is visualized, consistent with patency of cystic duct. Biliary activity  passes into small bowel, consistent with patent common bile duct. Calculated gallbladder ejection fraction is 21%. (At 60 min, normal ejection fraction is greater than 40%.) IMPRESSION: Normal uptake and excretion of biliary tracer. The gallbladder is well visualized documenting cystic duct patency. Reduced gallbladder ejection fraction of 21%. This may be related to a more chronic component of cholecystitis. Electronically Signed   By: Inez Catalina M.D.   On: 10/23/2017 16:43     CBC Recent Labs  Lab 10/22/17 1007 10/23/17 0316 10/23/17 0715  WBC 8.2 TEST WILL BE CREDITED 4.8  HGB 11.4* TEST WILL BE CREDITED 9.7*  HCT 34.0* TEST WILL BE CREDITED 28.9*  PLT 182 TEST WILL BE CREDITED 161  MCV 97.4 TEST WILL BE CREDITED 98.9  MCH 32.7 TEST WILL BE CREDITED 33.3  MCHC 33.6 TEST WILL BE CREDITED 33.7  RDW 13.9 TEST WILL BE CREDITED 13.7    Chemistries  Recent Labs  Lab 10/22/17 1007 10/23/17 0316 10/23/17 0658 10/23/17 1251 10/24/17 0734 10/25/17 0328 10/26/17 0428  NA 135 139 136  --  137 140 137  K 2.5* >7.5* 2.6* 2.8* 4.0 4.7 4.0  CL 87* 112* 96*  --  102 108 107  CO2 29 25 31   --  27 25 25   GLUCOSE 153* 103* 130*  --  113* 118* 99  BUN 8 5* 5*  --  <5* <5* <5*  CREATININE 0.85 0.52* 0.60*  --  0.63 0.73 0.59*  CALCIUM 8.5* 6.5* 7.9*  --  8.2* 7.4* 7.4*  MG  --   --  1.3*  --  2.0  --  1.4*  AST 87*  --   --   --   --  145*  --   ALT 24  --   --   --   --  35  --   ALKPHOS 93  --   --   --   --  88   --   BILITOT 2.4* 1.8*  --   --   --  1.8*  --    ------------------------------------------------------------------------------------------------------------------ estimated creatinine clearance is 85.5 mL/min (A) (by C-G formula based on SCr of 0.59 mg/dL (L)). ------------------------------------------------------------------------------------------------------------------ No results for input(s): HGBA1C in the last 72 hours. ------------------------------------------------------------------------------------------------------------------ No results for input(s): CHOL, HDL, LDLCALC, TRIG, CHOLHDL, LDLDIRECT in the last 72 hours. ------------------------------------------------------------------------------------------------------------------ No results for input(s): TSH, T4TOTAL, T3FREE, THYROIDAB in the last 72 hours.  Invalid input(s): FREET3 ------------------------------------------------------------------------------------------------------------------ No results for input(s): VITAMINB12, FOLATE, FERRITIN, TIBC, IRON, RETICCTPCT in the last 72 hours.  Coagulation profile No results for input(s): INR, PROTIME in the last 168 hours.  No results for input(s): DDIMER in the last 72 hours.  Cardiac Enzymes No results for input(s): CKMB, TROPONINI, MYOGLOBIN in the last 168 hours.  Invalid input(s): CK ------------------------------------------------------------------------------------------------------------------ Invalid input(s): POCBNP    Assessment & Plan   52 year old male patient with history of alcohol abuse, posttraumatic stress disorder, B12 deficiency, hypothyroidism currently under hospitalist service for syncope, nausea and vomiting.  1.  Recurrent syncope secondary to orthostatic hypotension Received IV fluids Echocardiogram showed EF of 55% Syncope secondary to poor oral intake for the last 1 week prior to admission and low blood pressure Carotid ultrasound done  shows no stenosis  2.  Subacute pontine infarct MRA brain reviewed Neurology consultation  3.  Hypomagnesemia Replace magnesium agree with pharmacy management  4.  Duodenitis, gastritis without bleeding Proton pump inhibitors IV, sucralfate Appreciate GI evaluation and endoscopy procedure by GI  5.  Alcohol abuse and alcohol withdrawal Continue Ativan IV Status post psychiatry consultation, no evidence of psychosis  6.  Intractable nausea Antiemetics  7.  DVT prophylaxis with Lovenox subcutaneously     Code Status Orders  (From admission, onward)        Start     Ordered   10/22/17 1455  Full code  Continuous     10/22/17 1454    Code Status History    Date Active Date Inactive Code Status Order ID Comments User Context   01/18/2017 1250 01/18/2017 2140 Full Code [id number]  Orbie Pyo, MD ED   11/02/2015 2000 11/05/2015 1938 Full Code [id number]  Henreitta Leber, MD Inpatient   09/01/2015 1650 09/05/2015 1648 Full Code [id number]  Clapacs, Madie Reno, MD Inpatient       Time Spent in 35 minutes  Greater than 50% of time spent in care coordination and counseling patient regarding the condition and plan of care.   Saundra Shelling M.D on 10/26/2017 at 9:57 AM  Between 7am to 6pm - Pager - [phone]  After 6pm go to [email or web address] - Proofreader  [employer]   Office  [phone]

## 2017-10-26 NOTE — Progress Notes (Signed)
[facility] NOTE - Electrolyte Management   Pharmacy Consult for Electrolyte Management Indication: Electrolyte Abnormalities  No Known Allergies  Patient Measurements: Height: 5\' 5"  (165.1 cm) Weight: 124 lb 11.2 oz (56.6 kg) IBW/kg (Calculated) : 61.5 Adjusted Body Weight:    Vital Signs: Temp: 98.3 F (36.8 C) (04/06 1011) Temp Source: Oral (04/06 1011) BP: 100/75 (04/06 1011) Pulse Rate: 103 (04/06 1011) Intake/Output from previous day: 04/05 0701 - 04/06 0700 In: 860 [P.O.:360; I.V.:500] Out: 2000 [Urine:2000] Intake/Output from this shift: Total I/O In: -  Out: 1800 [Urine:1800]  Labs: BMP Latest Ref Rng & Units 10/26/2017 10/25/2017 10/24/2017  Glucose 65 - 99 mg/dL 99 118(H) 113(H)  BUN 6 - 20 mg/dL <5(L) <5(L) <5(L)  Creatinine 0.61 - 1.24 mg/dL 0.59(L) 0.73 0.63  Sodium 135 - 145 mmol/L 137 140 137  Potassium 3.5 - 5.1 mmol/L 4.0 4.7 4.0  Chloride 101 - 111 mmol/L 107 108 102  CO2 22 - 32 mmol/L 25 25 27   Calcium 8.9 - 10.3 mg/dL 7.4(L) 7.4(L) 8.2(L)   Phosphorus: 2.9 Magnesium: 1.4  Assessment: Patient is a 52yo male admitted for abdominal pain and vomiting times 5 days. Presents with hypokalemia with K of 2.5. Initially started on IV fluids with 94mEq of KCl at 14ml/hr. Pharmacy consulted for electrolyte management.  Goal of Therapy:  Maintain electrolytes within range.  Plan:  Electrolytes are within range today. Will decrease KCl to 58mEq per liter of IV fluid as K is now approaching the upper end of normal range. Will follow up on AM labs.  4/6:  K 4.0,  Mag 1.4 Phos 2.9.  Patient receiving IVF w/ KCL 93meq/liter @ 125 ml/hr. Will order Magnesium sulfate 4 gram IV x 1.  F/u am labs.  Chinita Greenland PharmD Clinical Pharmacist 10/26/2017

## 2017-10-26 NOTE — Consult Note (Signed)
[facility] Consult   Reason for Consult: Follow-up note for 52 year old man with alcohol abuse and PTSD. Referring Physician: Vianne Bulls Patient Identification: Ivan Hamilton MRN:  [id number] Principal Diagnosis: PTSD (post-traumatic stress disorder) Diagnosis:   Patient Active Problem List   Diagnosis Date Noted  . Alcohol use disorder, severe, dependence ([place]) [F10.20] 09/02/2015    Priority: High  . Alcohol withdrawal ([place]) [F10.239] 09/02/2015    Priority: Medium  . PTSD (post-traumatic stress disorder) [F43.10] 09/01/2015    Priority: Medium  . Duodenitis [K29.80]   . Gastritis without bleeding [K29.70]   . Hypokalemia [E87.6] 10/22/2017  . Intractable vomiting with nausea [R11.2]   . Alcohol withdrawal delirium ([place]) [F10.231]   . 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[place]) [F10.229, F10.24, F32.89] 09/02/2015  . Hypothyroid [E03.9] 09/01/2015  . Gastric reflux [K21.9] 09/01/2015  . DDD (degenerative disc disease), lumbosacral [M51.37] 09/01/2015    Total Time spent with patient: 30 minutes  Subjective:   Ivan Hamilton is a 52 y.o. male patient admitted with PTSD and alcohol dependence who was admitted to the medicine servicewith nausea and vomiting. The patient does report problems with PTSD related to prior [employer] experience in [place]. He became noncompliant with [facility] in [place] secondary to lack of insurance. The patient has not been on medications for depression or PTSD and several months time. He was drinking 3 shots of tequila daily but has reduced to only 1 shot daily prior to admission. He is currently experiencing some intermittent visual hallucinations of seeing shadows as well as some mild. He also reports problems with some shakes and tremors. No auditory hallucinations. He denies any current active or passive suicidal thoughts. He is interested in  residential substance abuse treatment and would like to return to [place] where he has been in the past. His wife was in the room with him and she and his in-laws were very supportive of him going for substance abuse treatment.  Past Psychiatric History: Long-standing struggle with posttraumatic stress disorder and alcohol abuse with alcohol abuse causing multiple severe complications  Risk to Self: Is patient at risk for suicide?: No Risk to Others:  No Prior Inpatient Therapy:  [facility] Prior Outpatient Therapy:  Yes, [facility]  Past Medical History:  Past Medical History:  Diagnosis Date  . Alcohol withdrawal ([place])   . B12 deficiency   . Chronic pain   . Hypothyroidism   . Varicose veins     Past Surgical History:  Procedure Laterality Date  . ESOPHAGOGASTRODUODENOSCOPY (EGD) WITH PROPOFOL N/A 10/24/2017   Procedure: ESOPHAGOGASTRODUODENOSCOPY (EGD) WITH PROPOFOL;  Surgeon: [name], MD;  Location: [facility] ENDOSCOPY;  Service: Endoscopy;  Laterality: N/A;  . LEG SURGERY     Family Psychiatric  History: Positive for alcohol abuse Social History:  Social History   Substance and Sexual Activity  Alcohol Use Yes   Comment: daily- 1 pint of vodka     Social History   Substance and Sexual Activity  Drug Use No    Social History   Socioeconomic History  . Marital status: Married    Spouse name: Not on file  . Number of children: Not on file  . Years of education: Not on file  . Highest education level: Not on file  Occupational History  . Not on file  Social Needs  . Financial resource strain: Not on file  . Food insecurity:    Worry: Not on file  Inability: Not on file  . Transportation needs:    Medical: Not on file    Non-medical: Not on file  Tobacco Use  . Smoking status: Never Smoker  . Smokeless tobacco: Never Used  Substance and Sexual Activity  . Alcohol use: Yes    Comment: daily- 1 pint of vodka  . Drug use: No  . Sexual activity: Not on file   Lifestyle  . Physical activity:    Days per week: Not on file    Minutes per session: Not on file  . Stress: Not on file  Relationships  . Social connections:    Talks on phone: Not on file    Gets together: Not on file    Attends religious service: Not on file    Active member of club or organization: Not on file    Attends meetings of clubs or organizations: Not on file    Relationship status: Not on file  Other Topics Concern  . Not on file  Social History Narrative  . Not on file   Additional Social History:    Allergies:  No Known Allergies  Labs:  Results for orders placed or performed during the hospital encounter of 10/22/17 (from the past 48 hour(s))  Phosphorus     Status: Abnormal   Collection Time: 10/24/17  7:22 PM  Result Value Ref Range   Phosphorus <1.0 (LL) 2.5 - 4.6 mg/dL    Comment: CRITICAL RESULT CALLED TO, READ BACK BY AND VERIFIED WITH KAT MURRAY 10/24/17 1959 KLW Performed at [facility], [address] [place], [place] [address]   Comprehensive metabolic panel     Status: Abnormal   Collection Time: 10/25/17  3:28 AM  Result Value Ref Range   Sodium 140 135 - 145 mmol/L   Potassium 4.7 3.5 - 5.1 mmol/L   Chloride 108 101 - 111 mmol/L   CO2 25 22 - 32 mmol/L   Glucose, Bld 118 (H) 65 - 99 mg/dL   BUN <5 (L) 6 - 20 mg/dL   Creatinine, Ser 0.73 0.61 - 1.24 mg/dL   Calcium 7.4 (L) 8.9 - 10.3 mg/dL   Total Protein 6.2 (L) 6.5 - 8.1 g/dL   Albumin 2.8 (L) 3.5 - 5.0 g/dL   AST 145 (H) 15 - 41 U/L   ALT 35 17 - 63 U/L   Alkaline Phosphatase 88 38 - 126 U/L   Total Bilirubin 1.8 (H) 0.3 - 1.2 mg/dL   GFR calc non Af Amer >60 >60 mL/min   GFR calc Af Amer >60 >60 mL/min    Comment: (NOTE) The eGFR has been calculated using the CKD EPI equation. This calculation has not been validated in all clinical situations. eGFR's persistently <60 mL/min signify possible Chronic Kidney Disease.    Anion gap 7 5 - 15    Comment: Performed at  [facility], [address] [place], [place] [address]  Phosphorus     Status: Abnormal   Collection Time: 10/25/17  3:28 AM  Result Value Ref Range   Phosphorus 4.7 (H) 2.5 - 4.6 mg/dL    Comment: Performed at [facility], [address] [place], [place] [address]  CULTURE, BLOOD (ROUTINE X 2) w Reflex to ID Panel     Status: None (Preliminary result)   Collection Time: 10/25/17  5:01 PM  Result Value Ref Range   Specimen Description BLOOD RIGHT ANTECUBITAL    Special Requests      BOTTLES DRAWN AEROBIC  AND ANAEROBIC Blood Culture adequate volume   Culture      NO GROWTH < 24 HOURS Performed at [facility], [address] [place], [place] [address]    Report Status PENDING   CULTURE, BLOOD (ROUTINE X 2) w Reflex to ID Panel     Status: None (Preliminary result)   Collection Time: 10/25/17  5:11 PM  Result Value Ref Range   Specimen Description BLOOD BLOOD RIGHT HAND    Special Requests NONE    Culture      NO GROWTH < 24 HOURS Performed at [facility], [address] [place], [place] [address]    Report Status PENDING   Basic metabolic panel     Status: Abnormal   Collection Time: 10/26/17  4:28 AM  Result Value Ref Range   Sodium 137 135 - 145 mmol/L   Potassium 4.0 3.5 - 5.1 mmol/L   Chloride 107 101 - 111 mmol/L   CO2 25 22 - 32 mmol/L   Glucose, Bld 99 65 - 99 mg/dL   BUN <5 (L) 6 - 20 mg/dL   Creatinine, Ser 0.59 (L) 0.61 - 1.24 mg/dL   Calcium 7.4 (L) 8.9 - 10.3 mg/dL   GFR calc non Af Amer >60 >60 mL/min   GFR calc Af Amer >60 >60 mL/min    Comment: (NOTE) The eGFR has been calculated using the CKD EPI equation. This calculation has not been validated in all clinical situations. eGFR's persistently <60 mL/min signify possible Chronic Kidney Disease.    Anion gap 5 5 - 15    Comment: Performed at [facility], [address] [place], [place] [address]  Phosphorus     Status: None   Collection Time:  10/26/17  4:28 AM  Result Value Ref Range   Phosphorus 2.9 2.5 - 4.6 mg/dL    Comment: Performed at [facility], [address] [place], [place] [address]  Magnesium     Status: Abnormal   Collection Time: 10/26/17  4:28 AM  Result Value Ref Range   Magnesium 1.4 (L) 1.7 - 2.4 mg/dL    Comment: Performed at [facility], [address] [place], [place] [address]    Current Facility-Administered Medications  Medication Dose Route Frequency Provider Last Rate Last Dose  . 0.9 % NaCl with KCl 20 mEq/ L  infusion   Intravenous Continuous Epifanio Lesches, MD 125 mL/hr at 10/25/17 2350    . acetaminophen (TYLENOL) tablet 650 mg  650 mg Oral Q6H PRN [name], MD   650 mg at 10/24/17 2157   Or  . acetaminophen (TYLENOL) suppository 650 mg  650 mg Rectal Q6H PRN [name], MD      . busPIRone (BUSPAR) tablet 15 mg  15 mg Oral BID [name], MD   15 mg at 10/26/17 1424  . citalopram (CELEXA) tablet 20 mg  20 mg Oral Daily Chauncey Mann, MD      . enoxaparin (LOVENOX) injection 40 mg  40 mg Subcutaneous q1800 [name], MD   40 mg at 10/25/17 1720  . feeding supplement (ENSURE ENLIVE) (ENSURE ENLIVE) liquid 237 mL  237 mL Oral BID BM Epifanio Lesches, MD   237 mL at 10/26/17 1405  . folic acid (FOLVITE) tablet 1 mg  1 mg Oral Daily [name], MD   1 mg at 10/26/17 1015  . HYDROmorphone (DILAUDID) injection 0.5 mg  0.5 mg Intravenous Q4H PRN [name], MD   0.5  mg at 10/25/17 1951  . levothyroxine (SYNTHROID, LEVOTHROID) tablet 100 mcg  100 mcg Oral QAC breakfast [name], MD   100 mcg at 10/26/17 0825  . LORazepam (ATIVAN) tablet 2 mg  2 mg Oral Q6H PRN Pyreddy, Reatha Harps, MD      . metoprolol tartrate (LOPRESSOR) injection 2.5 mg  2.5 mg Intravenous Q4H PRN Epifanio Lesches, MD   2.5 mg at 10/25/17 1719  . multivitamin with minerals tablet 1 tablet  1 tablet Oral Daily [name], MD   1 tablet at 10/26/17 1013  . ondansetron (ZOFRAN) tablet  4 mg  4 mg Oral Q6H PRN [name], MD       Or  . ondansetron [facility]) injection 4 mg  4 mg Intravenous Q6H PRN [name], MD   4 mg at 10/22/17 2258  . pantoprazole (PROTONIX) injection 40 mg  40 mg Intravenous Q12H [name], MD   40 mg at 10/26/17 1154  . QUEtiapine (SEROQUEL) tablet 50 mg  50 mg Oral QHS Chauncey Mann, MD      . senna-docusate (Senokot-S) tablet 1 tablet  1 tablet Oral QHS PRN [name], MD   1 tablet at 10/22/17 2149  . thiamine (VITAMIN B-1) tablet 100 mg  100 mg Oral Daily [name], MD   100 mg at 10/26/17 1013   Or  . thiamine (B-1) injection 100 mg  100 mg Intravenous Daily [name], MD   100 mg at 10/26/17 1202    Musculoskeletal: Strength & Muscle Tone: decreased Gait & Station: unsteady Patient leans: N/A  Psychiatric Specialty Exam: Physical Exam  Nursing note and vitals reviewed. Constitutional: He appears well-developed.  HENT:  Head: Normocephalic and atraumatic.  Eyes: Pupils are equal, round, and reactive to light. Conjunctivae are normal.  Neck: Normal range of motion.  Cardiovascular: Regular rhythm and normal heart sounds.  Respiratory: Effort normal. No respiratory distress.  GI: Soft.  Musculoskeletal: Normal range of motion.  Neurological: He is alert.  Skin: Skin is warm and dry.  Psychiatric: Judgment normal. His affect is blunt. His speech is delayed. He is slowed. Thought content is not paranoid. He expresses no homicidal and no suicidal ideation. He exhibits abnormal recent memory.    Review of Systems  Constitutional: Negative.   HENT: Negative.   Eyes: Negative.   Respiratory: Negative.   Cardiovascular: Negative.   Gastrointestinal: Negative.   Musculoskeletal: Negative.   Skin: Negative.   Neurological: Negative.   Psychiatric/Behavioral: Positive for hallucinations, memory loss and substance abuse. Negative for depression and suicidal ideas. The patient is nervous/anxious and has insomnia.     Blood  pressure 101/80, pulse (!) 125, temperature 98.3 F (36.8 C), temperature source Oral, resp. rate 17, height '5\' 5"'  (1.651 m), weight 56.6 kg (124 lb 11.2 oz), SpO2 100 %.Body mass index is 20.75 kg/m.  General Appearance: Casual  Eye Contact:  Fair  Speech:  Slow and soft  Volume:  Decreased  Mood:  Anxious  Affect:  Blunt  Thought Process:  Goal Directed  Orientation:  Full (Time, Place, and Person)  Thought Content:  Logical  Suicidal Thoughts:  No  Homicidal Thoughts:  No  Memory:  Immediate;   Fair Recent;   Fair Remote;   Fair  Judgement:  Fair  Insight:  Fair  Psychomotor Activity:  Normal  Concentration:  Concentration: Fair  Recall:  [employer] of Knowledge:  Fair  Language:  Fair  Akathisia:  No  Handed:  Right  AIMS (if indicated):     Assets:  Desire for Improvement Financial Resources/Insurance Housing Resilience Social Support  ADL's:  Impaired  Cognition:  Impaired,  Mild  Sleep:        Treatment Plan Summary:  PTSD, chronic, military-related Major depressive disorder, mild, recurrent Alcohol use disorder, severe  [name] is a 52 year old married Hispanic male with a history of PTSD and alcohol use disorder admitted to the medicine service with intractable nausea and vomiting in the context of alcohol use. He denies any current active or passive suicidal thoughts but is having some mild psychosis   Alcohol use disorder, severe: The patient will be placed on Ativan per [facility] as well as multivitamin, thiamine and folic acid. He is expressing some alcohol withdrawal symptoms. Seroquel 50 mg by mouth at bedtime will be started to help with psychosis as well as PTSD symptoms. The patient is inserted in residential substance abuse treatment and would like to go to [place].  Referral has been completed by social work and he is awaiting a bed. He was advised to abstain from alcohol and any illicit drugs as they may worsen mood symptoms.  PTSD, chronic: We'll  plan to switch Prozac to Celexa 20 ng by mouth daily in the morning for anxiety and depression associated with PTSD. He also has BuSpar 106m po BID for anxiety and depression and Seroquel 50 mg by mouth nightly for insomnia, psychosis and mood stabilization will be added  Intractable nausea and vomiting: We'll defer medical management to hospitalist service.  Disposition: Hopefully, once the patient is medically cleared, he can go to a direct for residential substance abuse treatment. At this time, he is not in imminent danger to himself or others necessitating inpatient psychiatric hospitalization.      AChauncey Mann MD 10/26/2017 4:25 PM

## 2017-10-26 NOTE — Consult Note (Signed)
STROKE TEAM PROGRESS NOTE   HPI (From Chart):  HISTORY OF PRESENT ILLNESS: Ivan Hamilton  is a 52 y.o. male with a known history of varicose veins, hypothyroidism, B12 deficiency, alcohol abuse and alcohol withdrawal in the past presented to the emergency room with nausea vomiting.  Patient has intractable nausea and vomiting for the last couple of days.  Not able to tolerate any oral feeds.  He was having extreme weakness.  He also had dizzy spells for the last couple of days and was about to pass out the same.  Orthostatics were positive when EMS checked on the patient.  He also has some abdominal discomfort is aching in nature 5 out of 10 on a scale of 1-10.  Patient was evaluated in the emergency room his potassium level was low around 2.5.  Family members also say he has been losing weight and has difficulty swallowing food.  Has epigastric discomfort.  No vomiting of blood.  No coughing of blood and no complaints of any rectal bleed. Patient had a shot of tequila yesterday    SUBJECTIVE (INTERVAL HISTORY) His family is not at bedside. Says he has been feeling poorly, throwing up, not eating for a few minutes. He even threw up tequila shots. He only drinks tequila and has 2-3 shots a day. No smoking. He can see things and hear things other people don;t see or hear, feels someone is following him. No other vision problems, no new symptoms in the last few weeks such as weakness, vision changes, difficulty swallowing, problems ambulating. Was not taking aspirin he used to have ulcers a long time ago. No recent stomach problems.    OBJECTIVE Vitals:   10/25/17 1827 10/25/17 2049 10/26/17 0610 10/26/17 1011  BP: 92/71 102/79 112/84 100/75  Pulse: (!) 109 96 73 (!) 103  Resp: 20 17    Temp: 99.2 F (37.3 C) 99.2 F (37.3 C) 98.2 F (36.8 C) 98.3 F (36.8 C)  TempSrc: Oral Oral Oral Oral  SpO2: 100% 99% 100% 100%  Weight:      Height:        CBC:  Recent Labs  Lab 10/23/17 0316  10/23/17 0715  WBC TEST WILL BE CREDITED 4.8  HGB TEST WILL BE CREDITED 9.7*  HCT TEST WILL BE CREDITED 28.9*  MCV TEST WILL BE CREDITED 98.9  PLT TEST WILL BE CREDITED 160    Basic Metabolic Panel:  Recent Labs  Lab 10/24/17 0734  10/25/17 0328 10/26/17 0428  NA 137  --  140 137  K 4.0  --  4.7 4.0  CL 102  --  108 107  CO2 27  --  25 25  GLUCOSE 113*  --  118* 99  BUN <5*  --  <5* <5*  CREATININE 0.63  --  0.73 0.59*  CALCIUM 8.2*  --  7.4* 7.4*  MG 2.0  --   --  1.4*  PHOS <1.0*   < > 4.7* 2.9   < > = values in this interval not displayed.    Lipid Panel: No results found for: CHOL, TRIG, HDL, CHOLHDL, VLDL, LDLCALC HgbA1c: No results found for: HGBA1C Urine Drug Screen:     Component Value Date/Time   LABOPIA NONE DETECTED 01/18/2017 1200   COCAINSCRNUR NONE DETECTED 01/18/2017 1200   LABBENZ NONE DETECTED 01/18/2017 1200   AMPHETMU NONE DETECTED 01/18/2017 1200   THCU NONE DETECTED 01/18/2017 1200   LABBARB NONE DETECTED 01/18/2017 1200    Alcohol Level  Component Value Date/Time   ETH 320 (HH) 01/18/2017 1200    IMAGING  Mr Brain Wo Contrast  Result Date: 10/25/2017 CLINICAL DATA:  Alcohol withdrawals.  Dizziness and syncope. EXAM: MRI HEAD WITHOUT CONTRAST TECHNIQUE: Multiplanar, multiecho pulse sequences of the brain and surrounding structures were obtained without intravenous contrast. COMPARISON:  CT HEAD April 19, 2016 FINDINGS: INTRACRANIAL CONTENTS: Subcentimeter reduced diffusion and T2 hyperintensity central pons without ADC abnormality. No reduced diffusion to suggest status epilepticus. No susceptibility artifact to suggest hemorrhage. Mild global parenchymal brain volume loss. No hydrocephalus. No suspicious parenchymal signal, masses, mass effect. No abnormal extra-axial fluid collections. No extra-axial masses. VASCULAR: Normal major intracranial vascular flow voids present at skull base. SKULL AND UPPER CERVICAL SPINE: No abnormal sellar  expansion. No suspicious calvarial bone marrow signal. Craniocervical junction maintained. SINUSES/ORBITS: The mastoid air-cells and included paranasal sinuses are well-aerated.The included ocular globes and orbital contents are non-suspicious. OTHER: None. IMPRESSION: 1. Subacute central pontine small vessel infarct. 2. Stable mild parenchymal brain volume loss. Electronically Signed   By: Elon Alas M.D.   On: 10/25/2017 14:41   US Carotid Bilateral  Result Date: 10/25/2017 CLINICAL DATA:  Dizziness for the past 2 days.  Syncopal episode. EXAM: BILATERAL CAROTID DUPLEX ULTRASOUND TECHNIQUE: Pearline Cables scale imaging, color Doppler and duplex ultrasound were performed of bilateral carotid and vertebral arteries in the neck. COMPARISON:  None. FINDINGS: Criteria: Quantification of carotid stenosis is based on velocity parameters that correlate the residual internal carotid diameter with NASCET-based stenosis levels, using the diameter of the distal internal carotid lumen as the denominator for stenosis measurement. The following velocity measurements were obtained: RIGHT ICA:  71/26 cm/sec CCA:  10/25 cm/sec SYSTOLIC ICA/CCA RATIO:  1.1 ECA:  69 cm/sec LEFT ICA:  59/23 cm/sec CCA:  85/2 cm/sec SYSTOLIC ICA/CCA RATIO:  0.7 ECA:  74 cm/sec RIGHT CAROTID ARTERY: There is a minimal amount of hypoechoic plaque involving the origin and proximal aspects of the right internal carotid artery (image 22), not resulting in elevated peak systolic velocities within the interrogated course the right internal carotid artery to suggest a hemodynamically significant stenosis. RIGHT VERTEBRAL ARTERY:  Antegrade flow LEFT CAROTID ARTERY: There is a minimal amount of atherosclerotic plaque involving the origin and proximal aspects of the left internal carotid artery (image 54), not resulting in elevated peak systolic velocities within the interrogated course the left internal carotid artery to suggest a hemodynamically significant  stenosis. LEFT VERTEBRAL ARTERY:  Antegrade flow IMPRESSION: Minimal amount of bilateral atherosclerotic plaque, not resulting in a hemodynamically significant stenosis within either internal carotid artery. Electronically Signed   By: Sandi Mariscal M.D.   On: 10/25/2017 15:38       Physical exam: Exam: Gen: NAD, conversant, well nourised, obese, well groomed                     CV: RRR, no MRG. No Carotid Bruits. No peripheral edema, warm, nontender Eyes: Conjunctivae clear without exudates or hemorrhage  Neuro: Detailed Neurologic Exam  Speech:    Speech is normal; fluent and spontaneous with normal comprehension.  Cognition:    The patient is oriented to person, place, and time;   Cranial Nerves:    The pupils are equal, round, and reactive to light. Attempted fundoscopic exam could not visualize.  Visual fields are full to finger confrontation. Extraocular movements are intact. Trigeminal sensation is intact and the muscles of mastication are normal. The face is symmetric. The palate elevates in the  midline. Hearing intact. Voice is normal. Shoulder shrug is normal. The tongue has normal motion without fasciculations.   Coordination:    Normal finger to nose  Motor Observation:    No asymmetry, no atrophy, and no involuntary movements noted. Tone:    Normal muscle tone.      Strength:    Strength is V/V in the upper and lower limbs.      Sensation: intact to LT     Reflex Exam:  DTR's:    Deep tendon reflexes in the upper and lower extremities are symmetrical bilaterally.   Toes:    The toes are equiv bilaterally.   Clonus:    Clonus is absent.       ASSESSMENT/PLAN [name] is a 52 y.o. 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  Permissive hypertension (OK if < 220/120) but gradually normalize in 5-7 days .  Long-term BP goal normotensive  Hyperlipidemia  LDL pending, goal < 70  Add statin at discharge if not at goal  Diabetes type II  HgbA1c pending, goal < 7.0  Consider treatment if HgbA1c not at goal  Other Stroke Risk Factors  ETOH use, advised to drink no more than 1 drink(s) a day, also discussed risks of withdrawal do not stop abruptly see pcp for plan outpatient before starting to wean alcohol    Hospital day # 4  Personally examined patient and images, and have participated in and made any corrections needed to history, physical, neuro exam,assessment and plan as stated above.  I have personally obtained the history, evaluated lab date, reviewed imaging studies and agree with radiology interpretations.    Sarina Ill, MD Stroke Neurology  Stroke will sign off at this time. Please re-consult or call if needed.  To contact Stroke Continuity provider, please refer to [email or web address] After hours, contact General Neurology

## 2017-10-27 LAB — LIPID PANEL
Cholesterol: 129 mg/dL (ref 0–200)
HDL: <10 mg/dL — ABNORMAL LOW (ref >40)
Triglycerides: 151 mg/dL — ABNORMAL HIGH (ref <150)
VLDL: 30 mg/dL (ref 0–40)

## 2017-10-27 LAB — MAGNESIUM: Magnesium: 2.2 mg/dL (ref 1.7–2.4)

## 2017-10-27 LAB — POTASSIUM: Potassium: 4.2 mmol/L (ref 3.5–5.1)

## 2017-10-27 LAB — URINE CULTURE: Culture: NO GROWTH

## 2017-10-27 MED ORDER — ASPIRIN 81 MG PO CHEW
81.0000 mg | CHEWABLE_TABLET | Freq: Every day | ORAL | Status: DC
  Administered 2017-10-27 – 2017-10-28 (×2): 81 mg via ORAL
  Filled 2017-10-27 (×2): qty 1

## 2017-10-27 MED ORDER — PANTOPRAZOLE SODIUM 40 MG PO TBEC
40.0000 mg | DELAYED_RELEASE_TABLET | Freq: Two times a day (BID) | ORAL | Status: DC
  Administered 2017-10-27 – 2017-10-28 (×3): 40 mg via ORAL
  Filled 2017-10-27 (×3): qty 1

## 2017-10-27 NOTE — Progress Notes (Signed)
[place] [facility]                                                                                                                                                                                  Patient Demographics   Ivan Hamilton, is a 52 y.o. male, DOB - January 20, 1966, GYI:[id number]  Admit date - 10/22/2017   Admitting Physician Saundra Shelling, MD  Outpatient Primary MD for the patient is Alexis Goodell, MD   LOS - 5  Subjective: Patient seen and evaluated by me today Has decreased shaking in the upper extremities Has no nausea and vomiting No complaints of any chest pain Abdominal pain improved   Review of Systems:   CONSTITUTIONAL: No documented fever. No fatigue, weakness. No weight gain, history of weight loss.  EYES: No blurry or double vision.  ENT: No tinnitus. No postnasal drip. No redness of the oropharynx.  RESPIRATORY: No cough, no wheeze, no hemoptysis. No dyspnea.  CARDIOVASCULAR: No chest pain. No orthopnea. No palpitations. No syncope.  GASTROINTESTINAL: Has nausea, no vomiting or diarrhea. No abdominal pain. No melena or hematochezia.  GENITOURINARY: No dysuria or hematuria.  ENDOCRINE: No polyuria or nocturia. No heat or cold intolerance.  HEMATOLOGY: No anemia. No bruising. No bleeding.  INTEGUMENTARY: No rashes. No lesions.  MUSCULOSKELETAL: No arthritis. No swelling. No gout.  Tremors upper extremities decreased NEUROLOGIC: No numbness, tingling, or ataxia. No seizure-type activity.  PSYCHIATRIC: No anxiety. No insomnia. No ADD.    Vitals:   Vitals:   10/26/17 1413 10/26/17 1950 10/27/17 0436 10/27/17 1023  BP: 101/80 102/64 100/78 94/73  Pulse: (!) 125 (!) 107 98 (!) 109  Resp:  17 17 19   Temp:  (!) 101.5 F (38.6 C) 98.8 F (37.1 C)   TempSrc:  Oral Oral   SpO2:  98% 97%   Weight:      Height:        Wt Readings from Last 3 Encounters:  10/24/17 56.6 kg (124 lb 11.2 oz)  09/01/17 74.8 kg (165 lb)   09/01/17 74.8 kg (165 lb)     Intake/Output Summary (Last 24 hours) at 10/27/2017 1031 Last data filed at 10/26/2017 2000 Gross per 24 hour  Intake -  Output 625 ml  Net -625 ml    Physical Exam:   GENERAL: Pleasant-appearing in no apparent distress.  HEAD, EYES, EARS, NOSE AND THROAT: Atraumatic, normocephalic. Extraocular muscles are intact. Pupils equal and reactive to light. Sclerae anicteric. No conjunctival injection. No oro-pharyngeal erythema.  NECK: Supple. There is no jugular venous distention. No bruits, no lymphadenopathy, no thyromegaly.  HEART: Regular rate and rhythm,. No murmurs, no rubs, no clicks.  LUNGS: Clear to auscultation bilaterally. No rales or rhonchi. No wheezes.  ABDOMEN: Soft, flat, nontender, nondistended. Has good bowel sounds. No hepatosplenomegaly appreciated.  EXTREMITIES: No evidence of any cyanosis, clubbing, or peripheral edema.  +2 pedal and radial pulses bilaterally.  NEUROLOGIC: The patient is alert, awake, and oriented x3 with no focal motor or sensory deficits appreciated bilaterally.  SKIN: Moist and warm with no rashes appreciated.  Psych: Not anxious, depressed LN: No inguinal LN enlargement    Antibiotics   Anti-infectives (From admission, onward)   None      Medications   Scheduled Meds: . aspirin  81 mg Oral Daily  . busPIRone  15 mg Oral BID  . citalopram  20 mg Oral Daily  . enoxaparin (LOVENOX) injection  40 mg Subcutaneous q1800  . feeding supplement (ENSURE ENLIVE)  237 mL Oral BID BM  . folic acid  1 mg Oral Daily  . levothyroxine  100 mcg Oral QAC breakfast  . multivitamin with minerals  1 tablet Oral Daily  . pantoprazole  40 mg Oral BID AC  . QUEtiapine  50 mg Oral QHS  . thiamine  100 mg Oral Daily   Or  . thiamine  100 mg Intravenous Daily   Continuous Infusions:  PRN Meds:.acetaminophen **OR** acetaminophen, HYDROmorphone (DILAUDID) injection, LORazepam, metoprolol tartrate, ondansetron **OR** ondansetron  (ZOFRAN) IV, senna-docusate   Data Review:   Micro Results Recent Results (from the past 240 hour(s))  CULTURE, BLOOD (ROUTINE X 2) w Reflex to ID Panel     Status: None (Preliminary result)   Collection Time: 10/25/17  5:01 PM  Result Value Ref Range Status   Specimen Description BLOOD RIGHT ANTECUBITAL  Final   Special Requests   Final    BOTTLES DRAWN AEROBIC AND ANAEROBIC Blood Culture adequate volume   Culture   Final    NO GROWTH 2 DAYS Performed at [facility], [address], [place], [place] [address]    Report Status PENDING  Incomplete  CULTURE, BLOOD (ROUTINE X 2) w Reflex to ID Panel     Status: None (Preliminary result)   Collection Time: 10/25/17  5:11 PM  Result Value Ref Range Status   Specimen Description BLOOD BLOOD RIGHT HAND  Final   Special Requests NONE  Final   Culture   Final    NO GROWTH 2 DAYS Performed at [facility], [address] [place], [place] [address]    Report Status PENDING  Incomplete  Urine Culture     Status: None   Collection Time: 10/25/17  5:25 PM  Result Value Ref Range Status   Specimen Description   Final    URINE, RANDOM Performed at [facility], [address], [place], [place] [address]    Special Requests   Final    NONE Performed at [facility], [address] [place], [place] [address]    Culture   Final    NO GROWTH Performed at [facility], [place] [address], [place], [place] [address]    Report Status 10/27/2017 FINAL  Final    Radiology Reports Mr Brain Wo Contrast  Result Date: 10/25/2017 CLINICAL DATA:  Alcohol withdrawals.  Dizziness and syncope. EXAM: MRI HEAD WITHOUT CONTRAST TECHNIQUE: Multiplanar, multiecho pulse sequences of the brain and surrounding structures were obtained without intravenous contrast. COMPARISON:  CT HEAD April 19, 2016 FINDINGS: INTRACRANIAL CONTENTS: Subcentimeter reduced diffusion and T2 hyperintensity central pons without ADC  abnormality. No reduced diffusion to  suggest status epilepticus. No susceptibility artifact to suggest hemorrhage. Mild global parenchymal brain volume loss. No hydrocephalus. No suspicious parenchymal signal, masses, mass effect. No abnormal extra-axial fluid collections. No extra-axial masses. VASCULAR: Normal major intracranial vascular flow voids present at skull base. SKULL AND UPPER CERVICAL SPINE: No abnormal sellar expansion. No suspicious calvarial bone marrow signal. Craniocervical junction maintained. SINUSES/ORBITS: The mastoid air-cells and included paranasal sinuses are well-aerated.The included ocular globes and orbital contents are non-suspicious. OTHER: None. IMPRESSION: 1. Subacute central pontine small vessel infarct. 2. Stable mild parenchymal brain volume loss. Electronically Signed   By: Elon Alas M.D.   On: 10/25/2017 14:41   US Abdomen Complete  Result Date: 10/22/2017 CLINICAL DATA:  Abdominal pain for 2 weeks. EXAM: ABDOMEN ULTRASOUND COMPLETE COMPARISON:  CT abdomen pelvis dated November 02, 2015. FINDINGS: Gallbladder: Layering sludge. There are few linear intraluminal membranes. No pericholecystic fluid. No wall thickening visualized. No sonographic Murphy sign noted by sonographer. Common bile duct: Diameter: 4 mm, normal. Liver: No focal lesion identified. Diffusely increased in parenchymal echogenicity. Portal vein is patent on color Doppler imaging with normal direction of blood flow towards the liver. IVC: No abnormality visualized. Pancreas: Visualized portion unremarkable. Spleen: Size and appearance within normal limits. Right Kidney: Length: 10.8 cm. Echogenicity within normal limits. No mass or hydronephrosis visualized. Left Kidney: Length: 10.9 cm. Echogenicity within normal limits. No mass or hydronephrosis visualized. Abdominal aorta: No aneurysm visualized. Other findings: None. IMPRESSION: 1. Layering gallbladder sludge with a few linear intraluminal membranes  which could reflect sloughing of the mucosa in the setting of acute cholecystitis. However, there is no other sonographic evidence of acute cholecystitis at this time. Correlate clinically and consider HIDA scan for further evaluation. 2. Hepatic steatosis. Electronically Signed   By: Titus Dubin M.D.   On: 10/22/2017 17:52   US Carotid Bilateral  Result Date: 10/25/2017 CLINICAL DATA:  Dizziness for the past 2 days.  Syncopal episode. EXAM: BILATERAL CAROTID DUPLEX ULTRASOUND TECHNIQUE: Pearline Cables scale imaging, color Doppler and duplex ultrasound were performed of bilateral carotid and vertebral arteries in the neck. COMPARISON:  None. FINDINGS: Criteria: Quantification of carotid stenosis is based on velocity parameters that correlate the residual internal carotid diameter with NASCET-based stenosis levels, using the diameter of the distal internal carotid lumen as the denominator for stenosis measurement. The following velocity measurements were obtained: RIGHT ICA:  71/26 cm/sec CCA:  16/10 cm/sec SYSTOLIC ICA/CCA RATIO:  1.1 ECA:  69 cm/sec LEFT ICA:  59/23 cm/sec CCA:  96/0 cm/sec SYSTOLIC ICA/CCA RATIO:  0.7 ECA:  74 cm/sec RIGHT CAROTID ARTERY: There is a minimal amount of hypoechoic plaque involving the origin and proximal aspects of the right internal carotid artery (image 22), not resulting in elevated peak systolic velocities within the interrogated course the right internal carotid artery to suggest a hemodynamically significant stenosis. RIGHT VERTEBRAL ARTERY:  Antegrade flow LEFT CAROTID ARTERY: There is a minimal amount of atherosclerotic plaque involving the origin and proximal aspects of the left internal carotid artery (image 54), not resulting in elevated peak systolic velocities within the interrogated course the left internal carotid artery to suggest a hemodynamically significant stenosis. LEFT VERTEBRAL ARTERY:  Antegrade flow IMPRESSION: Minimal amount of bilateral atherosclerotic plaque,  not resulting in a hemodynamically significant stenosis within either internal carotid artery. Electronically Signed   By: Sandi Mariscal M.D.   On: 10/25/2017 15:38   Nm Hepato W/eject Fract  Result Date: 10/23/2017 CLINICAL DATA:  Right upper quadrant pain and abnormal  gallbladder ultrasound EXAM: NUCLEAR MEDICINE HEPATOBILIARY IMAGING WITH GALLBLADDER EF TECHNIQUE: Sequential images of the abdomen were obtained out to 60 minutes following intravenous administration of radiopharmaceutical. After slow intravenous infusion of 1.12 micrograms Cholecystokinin, gallbladder ejection fraction was determined. RADIOPHARMACEUTICALS:  4.52 mCi Tc-56m Choletec IV COMPARISON:  Ultrasound from the previous day. FINDINGS: Prompt uptake and biliary excretion of activity by the liver is seen. Gallbladder activity is visualized, consistent with patency of cystic duct. Biliary activity passes into small bowel, consistent with patent common bile duct. Calculated gallbladder ejection fraction is 21%. (At 60 min, normal ejection fraction is greater than 40%.) IMPRESSION: Normal uptake and excretion of biliary tracer. The gallbladder is well visualized documenting cystic duct patency. Reduced gallbladder ejection fraction of 21%. This may be related to a more chronic component of cholecystitis. Electronically Signed   By: Inez Catalina M.D.   On: 10/23/2017 16:43     CBC Recent Labs  Lab 10/22/17 1007 10/23/17 0316 10/23/17 0715  WBC 8.2 TEST WILL BE CREDITED 4.8  HGB 11.4* TEST WILL BE CREDITED 9.7*  HCT 34.0* TEST WILL BE CREDITED 28.9*  PLT 182 TEST WILL BE CREDITED 161  MCV 97.4 TEST WILL BE CREDITED 98.9  MCH 32.7 TEST WILL BE CREDITED 33.3  MCHC 33.6 TEST WILL BE CREDITED 33.7  RDW 13.9 TEST WILL BE CREDITED 13.7    Chemistries  Recent Labs  Lab 10/22/17 1007 10/23/17 0316 10/23/17 0658 10/23/17 1251 10/24/17 0734 10/25/17 0328 10/26/17 0428 10/27/17 0449  NA 135 139 136  --  137 140 137  --   K 2.5*  >7.5* 2.6* 2.8* 4.0 4.7 4.0 4.2  CL 87* 112* 96*  --  102 108 107  --   CO2 29 25 31   --  27 25 25   --   GLUCOSE 153* 103* 130*  --  113* 118* 99  --   BUN 8 5* 5*  --  <5* <5* <5*  --   CREATININE 0.85 0.52* 0.60*  --  0.63 0.73 0.59*  --   CALCIUM 8.5* 6.5* 7.9*  --  8.2* 7.4* 7.4*  --   MG  --   --  1.3*  --  2.0  --  1.4* 2.2  AST 87*  --   --   --   --  145*  --   --   ALT 24  --   --   --   --  35  --   --   ALKPHOS 93  --   --   --   --  88  --   --   BILITOT 2.4* 1.8*  --   --   --  1.8*  --   --    ------------------------------------------------------------------------------------------------------------------ estimated creatinine clearance is 85.5 mL/min (A) (by C-G formula based on SCr of 0.59 mg/dL (L)). ------------------------------------------------------------------------------------------------------------------ Recent Labs    10/26/17 1244  HGBA1C 5.1   ------------------------------------------------------------------------------------------------------------------ Recent Labs    10/27/17 0449  CHOL 129  HDL <10*  LDLCALC NOT CALCULATED  TRIG 151*  CHOLHDL NOT CALCULATED   ------------------------------------------------------------------------------------------------------------------ No results for input(s): TSH, T4TOTAL, T3FREE, THYROIDAB in the last 72 hours.  Invalid input(s): FREET3 ------------------------------------------------------------------------------------------------------------------ No results for input(s): VITAMINB12, FOLATE, FERRITIN, TIBC, IRON, RETICCTPCT in the last 72 hours.  Coagulation profile No results for input(s): INR, PROTIME in the last 168 hours.  No results for input(s): DDIMER in the last 72 hours.  Cardiac Enzymes No results for input(s): CKMB, TROPONINI, MYOGLOBIN in  the last 168 hours.  Invalid input(s):  CK ------------------------------------------------------------------------------------------------------------------ Invalid input(s): POCBNP    Assessment & Plan   52 year old male patient with history of alcohol abuse, posttraumatic stress disorder, B12 deficiency, hypothyroidism currently under hospitalist service for syncope, nausea and vomiting.  1.  Recurrent syncope secondary to orthostatic hypotension Received IV fluids Echocardiogram showed EF of 55% Syncope secondary to poor oral intake for the last 1 week prior to admission and low blood pressure Carotid ultrasound done shows no stenosis  2.  Subacute pontine infarct MRA brain reviewed Neurology consultation Start oral aspirin  3.  Hypomagnesemia improved Replace magnesium agree with pharmacy management  4.  Duodenitis, gastritis without bleeding Oral PPI Appreciate GI evaluation and endoscopy procedure by GI  5.  Alcohol abuse Prn ativan for agitation Status post psychiatry consultation, no evidence of psychosis Placement at residential substance abuse center  6.  DVT prophylaxis with Lovenox subcutaneously  7. PT consult     Code Status Orders  (From admission, onward)        Start     Ordered   10/22/17 1455  Full code  Continuous     10/22/17 1454    Code Status History    Date Active Date Inactive Code Status Order ID Comments User Context   01/18/2017 1250 01/18/2017 2140 Full Code [id number]  Orbie Pyo, MD ED   11/02/2015 2000 11/05/2015 1938 Full Code [id number]  Henreitta Leber, MD Inpatient   09/01/2015 1650 09/05/2015 1648 Full Code [id number]  Clapacs, Madie Reno, MD Inpatient       Time Spent in 35 minutes  Greater than 50% of time spent in care coordination and counseling patient regarding the condition and plan of care.   Saundra Shelling M.D on 10/27/2017 at 10:31 AM  Between 7am to 6pm - Pager - [phone]  After 6pm go to [email or web address] - Proofreader  [employer]   Office  [phone]

## 2017-10-27 NOTE — Plan of Care (Signed)
  Problem: Clinical Measurements: Goal: Ability to maintain clinical measurements within normal limits will improve Outcome: Not Progressing Note:  Total bilirubin = elevated at 1.8. Will continue to monitor gastrointestinal-related lab values. Wenda Low [facility]

## 2017-10-27 NOTE — Consult Note (Signed)
[place] Psychiatry Consult   Reason for Consult: Follow-up note for 52 year old man with alcohol abuse and PTSD. Referring Physician: Vianne Bulls Patient Identification: Ivan Hamilton MRN:  [id number] Principal Diagnosis: PTSD (post-traumatic stress disorder) Diagnosis:   Patient Active Problem List   Diagnosis Date Noted  . Alcohol use disorder, severe, dependence ([place]) [F10.20] 09/02/2015    Priority: High  . Alcohol withdrawal ([place]) [F10.239] 09/02/2015    Priority: Medium  . PTSD (post-traumatic stress disorder) [F43.10] 09/01/2015    Priority: Medium  . Duodenitis [K29.80]   . Gastritis without bleeding [K29.70]   . Hypokalemia [E87.6] 10/22/2017  . Intractable vomiting with nausea [R11.2]   . Alcohol withdrawal delirium ([place]) [F10.231]   . 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[place]) [F10.229, F10.24, F32.89] 09/02/2015  . Hypothyroid [E03.9] 09/01/2015  . Gastric reflux [K21.9] 09/01/2015  . DDD (degenerative disc disease), lumbosacral [M51.37] 09/01/2015    Total Time spent with patient: 30 minutes  Subjective:   Ivan Hamilton is a 52 y.o. male patient admitted with PTSD and alcohol dependence who was admitted to the medicine servicewith nausea and vomiting. The patient does report problems with PTSD related to prior [employer] experience in [place].  Detox is progressing well although tremors are still present. He did receive some when necessary Ativan per [facility]. He was able to sleep with Seroquel last night and has not had any hallucinations this morning. The patient does feel as if his mood has also improved. He tolerated his first dose of Celexa fairly well this morning. He denies any current active or passive suicidal thoughts or psychotic symptoms. He remains interested in residential substance abuse treatment and referral has been made to [facility].   Past Psychiatric History: Long-standing  struggle with posttraumatic stress disorder and alcohol abuse with alcohol abuse causing multiple severe complications  Risk to Self: Is patient at risk for suicide?: No Risk to Others:  No Prior Inpatient Therapy:  [facility] Prior Outpatient Therapy:  Yes, [facility]  Past Medical History:  Past Medical History:  Diagnosis Date  . Alcohol withdrawal ([place])   . B12 deficiency   . Chronic pain   . Hypothyroidism   . Varicose veins     Past Surgical History:  Procedure Laterality Date  . ESOPHAGOGASTRODUODENOSCOPY (EGD) WITH PROPOFOL N/A 10/24/2017   Procedure: ESOPHAGOGASTRODUODENOSCOPY (EGD) WITH PROPOFOL;  Surgeon: [name], MD;  Location: [facility] ENDOSCOPY;  Service: Endoscopy;  Laterality: N/A;  . LEG SURGERY     Family Psychiatric  History: Positive for alcohol abuse Social History:  Social History   Substance and Sexual Activity  Alcohol Use Yes   Comment: daily- 1 pint of vodka     Social History   Substance and Sexual Activity  Drug Use No    Social History   Socioeconomic History  . Marital status: Married    Spouse name: Not on file  . Number of children: Not on file  . Years of education: Not on file  . Highest education level: Not on file  Occupational History  . Not on file  Social Needs  . Financial resource strain: Not on file  . Food insecurity:    Worry: Not on file    Inability: Not on file  . Transportation needs:    Medical: Not on file    Non-medical: Not on file  Tobacco Use  . Smoking status: Never Smoker  . Smokeless tobacco: Never Used  Substance and Sexual Activity  .  Alcohol use: Yes    Comment: daily- 1 pint of vodka  . Drug use: No  . Sexual activity: Not on file  Lifestyle  . Physical activity:    Days per week: Not on file    Minutes per session: Not on file  . Stress: Not on file  Relationships  . Social connections:    Talks on phone: Not on file    Gets together: Not on file    Attends religious service: Not on  file    Active member of club or organization: Not on file    Attends meetings of clubs or organizations: Not on file    Relationship status: Not on file  Other Topics Concern  . Not on file  Social History Narrative  . Not on file   Additional Social History:    Allergies:  No Known Allergies  Labs:  Results for orders placed or performed during the hospital encounter of 10/22/17 (from the past 48 hour(s))  Basic metabolic panel     Status: Abnormal   Collection Time: 10/26/17  4:28 AM  Result Value Ref Range   Sodium 137 135 - 145 mmol/L   Potassium 4.0 3.5 - 5.1 mmol/L   Chloride 107 101 - 111 mmol/L   CO2 25 22 - 32 mmol/L   Glucose, Bld 99 65 - 99 mg/dL   BUN <5 (L) 6 - 20 mg/dL   Creatinine, Ser 0.59 (L) 0.61 - 1.24 mg/dL   Calcium 7.4 (L) 8.9 - 10.3 mg/dL   GFR calc non Af Amer >60 >60 mL/min   GFR calc Af Amer >60 >60 mL/min    Comment: (NOTE) The eGFR has been calculated using the CKD EPI equation. This calculation has not been validated in all clinical situations. eGFR's persistently <60 mL/min signify possible Chronic Kidney Disease.    Anion gap 5 5 - 15    Comment: Performed at [facility], [address] [place], [place] [address]  Phosphorus     Status: None   Collection Time: 10/26/17  4:28 AM  Result Value Ref Range   Phosphorus 2.9 2.5 - 4.6 mg/dL    Comment: Performed at [facility], [address] [place], [place] [address]  Magnesium     Status: Abnormal   Collection Time: 10/26/17  4:28 AM  Result Value Ref Range   Magnesium 1.4 (L) 1.7 - 2.4 mg/dL    Comment: Performed at [facility], [address] [place], [place] [address]  Hemoglobin A1c     Status: None   Collection Time: 10/26/17 12:44 PM  Result Value Ref Range   Hgb A1c MFr Bld 5.1 4.8 - 5.6 %    Comment: (NOTE) Pre diabetes:          5.7%-6.4% Diabetes:              >6.4% Glycemic control for   <7.0% adults with diabetes    Mean Plasma Glucose  99.67 mg/dL    Comment: Performed at [facility] [address] [place], [place] [address]  Magnesium     Status: None   Collection Time: 10/27/17  4:49 AM  Result Value Ref Range   Magnesium 2.2 1.7 - 2.4 mg/dL    Comment: Performed at [facility], [address] [place], [place] [address]  Potassium     Status: None   Collection Time: 10/27/17  4:49 AM  Result Value Ref Range   Potassium 4.2  3.5 - 5.1 mmol/L    Comment: Performed at [facility], [address] [place], [place] [address]  Lipid panel     Status: Abnormal   Collection Time: 10/27/17  4:49 AM  Result Value Ref Range   Cholesterol 129 0 - 200 mg/dL   Triglycerides 151 (H) <150 mg/dL   HDL <10 (L) >40 mg/dL   Total CHOL/HDL Ratio NOT CALCULATED RATIO   VLDL 30 0 - 40 mg/dL   LDL Cholesterol NOT CALCULATED 0 - 99 mg/dL    Comment: Performed at [facility], [address] [place],  [address]    Current Facility-Administered Medications  Medication Dose Route Frequency Provider Last Rate Last Dose  . acetaminophen (TYLENOL) tablet 650 mg  650 mg Oral Q6H PRN [name], MD   650 mg at 10/27/17 1342   Or  . acetaminophen (TYLENOL) suppository 650 mg  650 mg Rectal Q6H PRN [name], MD      . aspirin chewable tablet 81 mg  81 mg Oral Daily Pyreddy, Pavan, MD   81 mg at 10/27/17 1118  . busPIRone (BUSPAR) tablet 15 mg  15 mg Oral BID [name], MD   15 mg at 10/27/17 0945  . citalopram (CELEXA) tablet 20 mg  20 mg Oral Daily Chauncey Mann, MD   20 mg at 10/27/17 0945  . enoxaparin (LOVENOX) injection 40 mg  40 mg Subcutaneous q1800 [name], MD   40 mg at 10/27/17 1729  . feeding supplement (ENSURE ENLIVE) (ENSURE ENLIVE) liquid 237 mL  237 mL Oral BID BM Epifanio Lesches, MD   237 mL at 10/27/17 0945  . folic acid (FOLVITE) tablet 1 mg  1 mg Oral Daily [name], MD   1 mg at 10/27/17 0946  . HYDROmorphone (DILAUDID) injection 0.5 mg  0.5 mg  Intravenous Q4H PRN [name], MD   0.5 mg at 10/25/17 1951  . levothyroxine (SYNTHROID, LEVOTHROID) tablet 100 mcg  100 mcg Oral QAC breakfast [name], MD   100 mcg at 10/27/17 0845  . LORazepam (ATIVAN) tablet 2 mg  2 mg Oral Q6H PRN Saundra Shelling, MD   2 mg at 10/26/17 2355  . metoprolol tartrate (LOPRESSOR) injection 2.5 mg  2.5 mg Intravenous Q4H PRN Epifanio Lesches, MD   2.5 mg at 10/25/17 1719  . multivitamin with minerals tablet 1 tablet  1 tablet Oral Daily [name], MD   1 tablet at 10/27/17 0945  . ondansetron (ZOFRAN) tablet 4 mg  4 mg Oral Q6H PRN [name], MD       Or  . ondansetron [facility]) injection 4 mg  4 mg Intravenous Q6H PRN [name], MD   4 mg at 10/22/17 2258  . pantoprazole (PROTONIX) EC tablet 40 mg  40 mg Oral BID AC Pyreddy, Reatha Harps, MD   40 mg at 10/27/17 1729  . QUEtiapine (SEROQUEL) tablet 50 mg  50 mg Oral QHS Chauncey Mann, MD   50 mg at 10/26/17 2157  . senna-docusate (Senokot-S) tablet 1 tablet  1 tablet Oral QHS PRN [name], MD   1 tablet at 10/22/17 2149  . thiamine (VITAMIN B-1) tablet 100 mg  100 mg Oral Daily [name], MD   100 mg at 10/27/17 0945   Or  . thiamine (B-1) injection 100 mg  100 mg Intravenous Daily [name], MD   100 mg at 10/26/17 1202    Musculoskeletal: Strength & Muscle Tone: decreased Gait &  Station: unsteady Patient leans: N/A  Psychiatric Specialty Exam: Physical Exam  Nursing note and vitals reviewed. Constitutional: He appears well-developed.  HENT:  Head: Normocephalic and atraumatic.  Eyes: Pupils are equal, round, and reactive to light. Conjunctivae are normal.  Neck: Normal range of motion.  Cardiovascular: Regular rhythm and normal heart sounds.  Respiratory: Effort normal. No respiratory distress.  GI: Soft.  Musculoskeletal: Normal range of motion.  Neurological: He is alert.  Skin: Skin is warm and dry.  Psychiatric: Judgment normal. His affect is blunt. His speech is  delayed. He is slowed. Thought content is not paranoid. He expresses no homicidal and no suicidal ideation. He exhibits abnormal recent memory.    Review of Systems  Constitutional: Negative.   HENT: Negative.   Eyes: Negative.   Respiratory: Negative.   Cardiovascular: Negative.   Gastrointestinal: Negative.   Musculoskeletal: Negative.   Skin: Negative.   Neurological: Negative.   Psychiatric/Behavioral: Positive for memory loss and substance abuse. Negative for depression and suicidal ideas. The patient is nervous/anxious.     Blood pressure 94/73, pulse (!) 109, temperature 98.8 F (37.1 C), temperature source Oral, resp. rate 19, height '5\' 5"'  (1.651 m), weight 56.6 kg (124 lb 11.2 oz), SpO2 97 %.Body mass index is 20.75 kg/m.  General Appearance: Casual  Eye Contact:  Fair  Speech:  Slow and soft  Volume:  Decreased  Mood:  Calmer today  Affect:  Blunt  Thought Process:  Goal Directed  Orientation:  Full (Time, Place, and Person)  Thought Content:  Logical  Suicidal Thoughts:  No  Homicidal Thoughts:  No  Memory:  Immediate;   Fair Recent;   Fair Remote;   Fair  Judgement:  Fair  Insight:  Fair  Psychomotor Activity:  Normal  Concentration:  Concentration: Fair  Recall:  [employer] of Knowledge:  Fair  Language:  Fair  Akathisia:  No  Handed:  Right  AIMS (if indicated):     Assets:  Desire for Improvement Financial Resources/Insurance Housing Resilience Social Support  ADL's:  Impaired  Cognition:  Impaired,  Mild  Sleep:        Treatment Plan Summary:  PTSD, chronic, military-related Major depressive disorder, mild, recurrent Alcohol use disorder, severe  [name] is a 53 year old married Hispanic male with a history of PTSD and alcohol use disorder admitted to the medicine service with intractable nausea and vomiting in the context of alcohol use. He denies any current active or passive suicidal thoughts but is having some mild  psychosis   Alcohol use disorder, severe: The patient will be placed on Ativan per [facility]WA as well as multivitamin, thiamine and folic acid. He is experiencing some alcohol withdrawal symptoms. Seroquel 50 mg by mouth at bedtime was started to help with psychosis as well as PTSD symptoms. The patient is inserted in residential substance abuse treatment and would like to go to [facility].  Referral has been completed by social work and he is awaiting a bed. He was advised to abstain from alcohol and any illicit drugs as they may worsen mood symptoms.  PTSD, chronic: Will continue Celexa 20 ng by mouth daily in the morning for anxiety and depression associated with PTSD. He also has BuSpar 76m po BID for anxiety and depression and Seroquel 50 mg by mouth nightly for insomnia, psychosis and mood stabilization will be added  Intractable nausea and vomiting: We'll defer medical management to hospitalist service.  Disposition: Hopefully, once the patient is medically cleared, he  can go to a direct for residential substance abuse treatment. At this time, he is not in imminent danger to himself or others necessitating inpatient psychiatric hospitalization.      Chauncey Mann, MD 10/27/2017 6:23 PM

## 2017-10-27 NOTE — Progress Notes (Signed)
[facility] CONSULT NOTE - Electrolyte Management   Pharmacy Consult for Electrolyte Management Indication: Electrolyte Abnormalities  No Known Allergies  Patient Measurements: Height: 5\' 5"  (165.1 cm) Weight: 124 lb 11.2 oz (56.6 kg) IBW/kg (Calculated) : 61.5 Adjusted Body Weight:    Vital Signs: Temp: 98.8 F (37.1 C) (04/07 0436) Temp Source: Oral (04/07 0436) BP: 100/78 (04/07 0436) Pulse Rate: 98 (04/07 0436) Intake/Output from previous day: 04/06 0701 - 04/07 0700 In: -  Out: 2425 [Urine:2425] Intake/Output from this shift: No intake/output data recorded.  Labs: BMP Latest Ref Rng & Units 10/27/2017 10/26/2017 10/25/2017  Glucose 65 - 99 mg/dL - 99 118(H)  BUN 6 - 20 mg/dL - <5(L) <5(L)  Creatinine 0.61 - 1.24 mg/dL - 0.59(L) 0.73  Sodium 135 - 145 mmol/L - 137 140  Potassium 3.5 - 5.1 mmol/L 4.2 4.0 4.7  Chloride 101 - 111 mmol/L - 107 108  CO2 22 - 32 mmol/L - 25 25  Calcium 8.9 - 10.3 mg/dL - 7.4(L) 7.4(L)   Phosphorus 4/6: 2.9 Magnesium: 2.2  Assessment: Patient is a 52yo male admitted for abdominal pain and vomiting times 5 days. Hx ETOH-drinks tequila and has 2-3 shots a day Presents with hypokalemia with K of 2.5. Initially started on IV fluids with 74mEq of KCl at 144ml/hr. Pharmacy consulted for electrolyte management.  Goal of Therapy:  Maintain electrolytes within range.  Plan:  Electrolytes are within range today. Will decrease KCl to 54mEq per liter of IV fluid as K is now approaching the upper end of normal range. Will follow up on AM labs.  4/6:  K 4.0,  Mag 1.4 Phos 2.9.  Patient receiving IVF w/ KCL 28meq/liter @ 125 ml/hr. Will order Magnesium sulfate 4 gram IV x 1.  F/u am labs.  4/7: K 4.2,  Mag 2.2 Patient receiving IVF w/ KCL 59meq/liter @ 125 ml/hr. F/u am labs.  Chinita Greenland PharmD Clinical Pharmacist 10/27/2017

## 2017-10-27 NOTE — Progress Notes (Signed)
PHARMACIST - PHYSICIAN COMMUNICATION   CONCERNING: IV to Oral Route Change Policy  RECOMMENDATION: This patient is receiving PROTONIX by the intravenous route.  Based on criteria approved by the Pharmacy and Therapeutics Committee, the intravenous medication(s) is/are being converted to the equivalent oral dose form(s).   DESCRIPTION: These criteria include:  The patient is eating (either orally or via tube) and/or has been taking other orally administered medications for a least 24 hours  The patient has no evidence of active gastrointestinal bleeding or impaired GI absorption (gastrectomy, short bowel, patient on TNA or NPO).  If you have questions about this conversion, please contact the Pharmacy Department  []   [phone] )  [facility] [x]   [phone] )  [facility] []   [phone] )  [facility] []   [phone] )  [facility] []   [phone] )  [place], [facility] 10/27/2017 8:59 AM

## 2017-10-28 LAB — IRON AND TIBC
Iron: 42 ug/dL — ABNORMAL LOW (ref 45–182)
Saturation Ratios: 39 % (ref 17.9–39.5)
TIBC: 107 ug/dL — ABNORMAL LOW (ref 250–450)
UIBC: 65 ug/dL

## 2017-10-28 LAB — BASIC METABOLIC PANEL
Anion gap: 6 (ref 5–15)
BUN: <5 mg/dL — ABNORMAL LOW (ref 6–20)
CO2: 24 mmol/L (ref 22–32)
Calcium: 8.2 mg/dL — ABNORMAL LOW (ref 8.9–10.3)
Chloride: 106 mmol/L (ref 101–111)
Creatinine, Ser: 0.52 mg/dL — ABNORMAL LOW (ref 0.61–1.24)
GFR calc Af Amer: >60 mL/min (ref >60)
GFR calc non Af Amer: >60 mL/min (ref >60)
Glucose, Bld: 113 mg/dL — ABNORMAL HIGH (ref 65–99)
Potassium: 3.7 mmol/L (ref 3.5–5.1)
Sodium: 136 mmol/L (ref 135–145)

## 2017-10-28 LAB — FOLATE: Folate: 40 ng/mL (ref >5.9)

## 2017-10-28 LAB — VITAMIN B12: Vitamin B-12: 784 pg/mL (ref 180–914)

## 2017-10-28 LAB — FERRITIN: Ferritin: 1059 ng/mL — ABNORMAL HIGH (ref 24–336)

## 2017-10-28 LAB — MAGNESIUM: Magnesium: 2 mg/dL (ref 1.7–2.4)

## 2017-10-28 LAB — PHOSPHORUS: Phosphorus: 1.9 mg/dL — ABNORMAL LOW (ref 2.5–4.6)

## 2017-10-28 MED ORDER — PANTOPRAZOLE SODIUM 40 MG PO TBEC: 40.0000 mg | DELAYED_RELEASE_TABLET | Freq: Two times a day (BID) | ORAL | 1 refills | Status: DC

## 2017-10-28 MED ORDER — ADULT MULTIVITAMIN W/MINERALS CH: 1.0000 | ORAL_TABLET | Freq: Every day | ORAL | 0 refills | Status: AC

## 2017-10-28 MED ORDER — FOLIC ACID 1 MG PO TABS: 1.0000 mg | ORAL_TABLET | Freq: Every day | ORAL | 0 refills | Status: AC

## 2017-10-28 MED ORDER — QUETIAPINE FUMARATE 50 MG PO TABS: 50.0000 mg | ORAL_TABLET | Freq: Every day | ORAL | 0 refills | Status: DC

## 2017-10-28 MED ORDER — THIAMINE HCL 100 MG PO TABS: 100.0000 mg | ORAL_TABLET | Freq: Every day | ORAL | 0 refills | Status: AC

## 2017-10-28 MED ORDER — ASPIRIN 81 MG PO CHEW: 81.0000 mg | CHEWABLE_TABLET | Freq: Every day | ORAL | 1 refills | Status: DC

## 2017-10-28 MED ORDER — ENSURE ENLIVE PO LIQD: 237.0000 mL | Freq: Two times a day (BID) | ORAL | 12 refills | Status: DC

## 2017-10-28 MED ORDER — CITALOPRAM HYDROBROMIDE 20 MG PO TABS: 20.0000 mg | ORAL_TABLET | Freq: Every day | ORAL | 0 refills | Status: DC

## 2017-10-28 MED ORDER — POTASSIUM PHOSPHATE MONOBASIC 500 MG PO TABS
1000.0000 mg | ORAL_TABLET | ORAL | Status: DC
  Administered 2017-10-28: 1000 mg via ORAL
  Filled 2017-10-28 (×3): qty 2

## 2017-10-28 NOTE — Progress Notes (Signed)
Preparing patient discharge paperwork. Wife approached desk stating that she did not want to take patient home, he was not feeling well. I went into room to assess patient as he had not been previously complaining of anything but a mild headache that was improved with tylenol that was given. Patient now reporting dizziness and pain in right lower chest. Notified dr. Estanislado Pandy. He would like orthostatic vitals then discharge if they are unremarkable.

## 2017-10-28 NOTE — Progress Notes (Signed)
Pharmacy consulted for electrolyte replacement protocol:   Goal of therapy: Electrolytes within normal limits:  K 3.5 - 5.1 Corrected Ca 8.9 - 10.3 Phos 2.5 - 4.6 Mg 1.7 - 2.4   Assessment: Lab Results  Component Value Date   CREATININE 0.52 (L) 10/28/2017   BUN <5 (L) 10/28/2017   NA 136 10/28/2017   K 3.7 10/28/2017   CL 106 10/28/2017   CO2 24 10/28/2017  Phos 1.9   Plan: K Phos neutral tablets, 2 tablet q4h x 4 doses. Recheck with AM labs    Thomasenia Sales, PharmD, MBA, [facility]

## 2017-10-28 NOTE — Progress Notes (Signed)
Physical Therapy Evaluation Patient Details Name: Ivan Hamilton MRN: [id number] DOB: 03-Jun-1966 Today's Date: 10/28/2017   History of Present Illness  Ivan Hamilton  is a 52 y.o. male with a known history of varicose veins, hypothyroidism, B12 deficiency, alcohol abuse and alcohol withdrawal in the past presented to the emergency room with nausea vomiting.  Patient has intractable nausea and vomiting for the last couple of days.  Not able to tolerate any oral feeds.  He was having extreme weakness.  He also had dizzy spells for the last couple of days and was about to pass out the same.  Orthostatics were positive when EMS checked on the patient.  He also has some abdominal discomfort is aching in nature 5 out of 10 on a scale of 1-10.  Patient was evaluated in the emergency room his potassium level was low around 2.5.  Family members also say he has been losing weight and has difficulty swallowing food.  Has epigastric discomfort.  No vomiting of blood.  No coughing of blood and no complaints of any rectal bleed. Patient had a shot of tequila yesterday.  Clinical Impression  Pt is independent with bed mobility and transfers. He is able to complete a full lap around RN station with therapist supervision. HR increasese from 117 to 126 with ambulation but pt is asymptomatic. Denies DOE with ambulation. Mild lateral gait deviation with head turns. Pt is safe and steady with ambulation without assistive device. Mild deficits in higher level balance such as single leg balance but otherwise no deficits noted. No PT needs at this time. Will complete order. Pt is safe to return home at discharge when medically appropriate.        Follow Up Recommendations No PT follow up    Equipment Recommendations  None recommended by PT    Recommendations for Other Services       Precautions / Restrictions Precautions Precautions: None Restrictions Weight Bearing Restrictions: No      Mobility  Bed  Mobility Overal bed mobility: Independent             General bed mobility comments: No deficits  Transfers Overall transfer level: Independent               General transfer comment: No deficits. Pt transfers to/from bed and commode without assist  Ambulation/Gait Ambulation/Gait assistance: Supervision Ambulation Distance (Feet): 220 Feet Assistive device: None   Gait velocity: WFL for full community mobility Gait velocity interpretation: >2.62 ft/sec, indicative of independent community ambulator General Gait Details: Pt is able to complete a full lap around RN station with therapist. HR increasese from 117 to 126 with ambulation but pt is asymptomatic. Denies DOE with ambulation. Mild lateral gait deviation with head turns. Pt is safe and steady with ambulation without assistive device  Stairs            Wheelchair Mobility    Modified Rankin (Stroke Patients Only)       Balance Overall balance assessment: Independent(Single leg balance 3-4s bilaterally)                                           Pertinent Vitals/Pain Pain Assessment: 0-10 Pain Score: 6  Pain Location: Headache, reports chest pain with cough Pain Intervention(s): Other (comment);Premedicated before session(RN in room at time of questioning)    Home Living Family/patient expects to be discharged to::  Private residence Living Arrangements: Spouse/significant other;Children Available Help at Discharge: Family Type of Home: House Home Access: Stairs to enter Entrance Stairs-Rails: None Entrance Stairs-Number of Steps: 1 Home Layout: One level Home Equipment: None      Prior Function Level of Independence: Independent         Comments: Independent with ADLs/IADLs. No falls in the last 12 months     Hand Dominance   Dominant Hand: Right    Extremity/Trunk Assessment   Upper Extremity Assessment Upper Extremity Assessment: Overall WFL for tasks  assessed    Lower Extremity Assessment Lower Extremity Assessment: Overall WFL for tasks assessed       Communication   Communication: No difficulties  Cognition Arousal/Alertness: Awake/alert Behavior During Therapy: WFL for tasks assessed/performed Overall Cognitive Status: No family/caregiver present to determine baseline cognitive functioning                                 General Comments: Pt unsure of the year but otherwise is able to answer all questions appropriately      General Comments      Exercises     Assessment/Plan    PT Assessment Patent does not need any further PT services  PT Problem List         PT Treatment Interventions      PT Goals (Current goals can be found in the Care Plan section)  Acute Rehab PT Goals PT Goal Formulation: All assessment and education complete, DC therapy    Frequency     Barriers to discharge        Co-evaluation               AM-PAC PT "6 Clicks" Daily Activity  Outcome Measure Difficulty turning over in bed (including adjusting bedclothes, sheets and blankets)?: None Difficulty moving from lying on back to sitting on the side of the bed? : None Difficulty sitting down on and standing up from a chair with arms (e.g., wheelchair, bedside commode, etc,.)?: None Help needed moving to and from a bed to chair (including a wheelchair)?: None Help needed walking in hospital room?: None Help needed climbing 3-5 steps with a railing? : None 6 Click Score: 24    End of Session Equipment Utilized During Treatment: Gait belt Activity Tolerance: Patient tolerated treatment well Patient left: in bed;with call bell/phone within reach;with nursing/sitter in room   PT Visit Diagnosis: Unsteadiness on feet (R26.81)    Time: 2878-6767 PT Time Calculation (min) (ACUTE ONLY): 17 min   Charges:   PT Evaluation $PT Eval Low Complexity: 1 Low     PT G Codes:        Lyndel Safe Flavia Bruss PT, DPT     Lynx Goodrich 10/28/2017, 12:20 PM

## 2017-10-28 NOTE — Progress Notes (Signed)
Pt refuse bedalarm. Will continue to monitor and assess

## 2017-10-28 NOTE — Care Management Important Message (Signed)
Important Message  Patient Details  Name: Ivan Hamilton MRN: [id number] Date of Birth: June 17, 1966   Medicare Important Message Given:  Yes  spanish notice  Katrina Stack, RN 10/28/2017, 2:59 PM

## 2017-10-28 NOTE — Plan of Care (Signed)
°  Problem: Coping: °Goal: Level of anxiety will decrease °Outcome: Progressing °  °

## 2017-10-28 NOTE — Clinical Social Work Note (Addendum)
CSW spoke with [facility] and they have not reviewed the referral yet for the patient.  They will be reviewing it today, and will contact CSW if they are able to accept patient.  CSW awaiting for response from [facility].  CSW has not heard back from [facility], CSW provided contact information for patient to follow up with [facility].  CSW informed patient that if CSW hears from facility after he discharges, CSW will contact patient.  Jones Broom. Zap, MSW, [place]  10/28/2017 10:30 AM

## 2017-10-28 NOTE — Progress Notes (Addendum)
Orthostatic vital signs complete. They are unremarkable. Discussed with Dr. Estanislado Pandy. He will order home health PT for patient. Wife states she will take patient home and if he gets worse she will take him to another hospital. This information was shared with Dr. Estanislado Pandy. Patient being discharged home with wife.

## 2017-10-29 LAB — SURGICAL PATHOLOGY

## 2017-10-29 NOTE — Discharge Summary (Signed)
Ivan Hamilton at [facility], 52 y.o., DOB May 23, 1966, MRN [id number]. Admission date: 10/22/2017 Discharge Date 10/28/2017 Primary MD Ivan Goodell, MD Admitting Physician Ivan Shelling, MD  Admission Diagnosis   1.  Hypokalemia 2.  Intractable nausea vomiting 3.  Alcoholic gastritis 4.  Alcohol abuse 5.  Dehydration 6.Anemia  Discharge Diagnosis       1.  Duodenitis 2.  Alcoholic gastritis 3.  Subacute pontine infarct 4.  Alcohol withdrawal 5 electrolyte imbalance. 6.Iron deficiency Anemia  Hospital Course  A 52 year old male patient with history of alcohol abuse, hypothyroidism, B12 deficiency, varicose veins was admitted on 10/22/2017 for nausea, vomiting, hypokalemia and abdominal discomfort.  Patient was put on [place] protocol for alcohol withdrawal.  He received thiamine, B12 supplements during his stay in the hospital.  Nausea and vomiting was controlled with antiemetics.  Patient's also had dizzy spells at the time of admission with multiple episodes of syncope in the past. Syncope secondary to dehydration, received iv fluids. He was seen by gastroenterology in the hospital and was worked up with endoscopy which showed duodenitis and gastritis but no evidence of any bleeding.  Patient was put on proton pump inhibitors initially intravenously and switched to orally during the hospitalization.  He was also worked up with MRI brain for dizzy spells and multiple episodes of syncope which showed subacute pontine infarct.  Patient received physical therapy during the stay in the hospital he was seen by neurology.  Who recommended baby aspirin.  Patient was also seen by psychiatry for posttraumatic stress disorder.  He was started on Celexa 20 mg orally daily for the anxiety and depression associated with posttraumatic stress disorder.  He also continued BuSpar orally twice daily for anxiety and oral Seroquel for insomnia and for mood stabilization and  psychosis. During the workup in the hospital he also had ultrasound of the gallbladder which which showed layering of gallbladder sludge consistent with possible cholecystitis he was worked up with a HIDA scan of the biliary tracer he had a component of chronic cholecystitis and no acute intervention recommended. Carotid ultrasound done during work up for syncope shows no stenosis. Patient hemodynamically stable.  He was treated for alcohol withdrawal during his stay in the hospital.  He will be discharged home.  Social worker has kept him on the waiting list for the residential substance abuse center for bed becomes available.  Consults   Gastroenterology Neurology Psychiatry  Significant Tests:  See full reports for all details    Mr Brain Wo Contrast  Result Date: 10/25/2017 CLINICAL DATA:  Alcohol withdrawals.  Dizziness and syncope. EXAM: MRI HEAD WITHOUT CONTRAST TECHNIQUE: Multiplanar, multiecho pulse sequences of the brain and surrounding structures were obtained without intravenous contrast. COMPARISON:  CT HEAD April 19, 2016 FINDINGS: INTRACRANIAL CONTENTS: Subcentimeter reduced diffusion and T2 hyperintensity central pons without ADC abnormality. No reduced diffusion to suggest status epilepticus. No susceptibility artifact to suggest hemorrhage. Mild global parenchymal brain volume loss. No hydrocephalus. No suspicious parenchymal signal, masses, mass effect. No abnormal extra-axial fluid collections. No extra-axial masses. VASCULAR: Normal major intracranial vascular flow voids present at skull base. SKULL AND UPPER CERVICAL SPINE: No abnormal sellar expansion. No suspicious calvarial bone marrow signal. Craniocervical junction maintained. SINUSES/ORBITS: The mastoid air-cells and included paranasal sinuses are well-aerated.The included ocular globes and orbital contents are non-suspicious. OTHER: None. IMPRESSION: 1. Subacute central pontine small vessel infarct. 2. Stable mild  parenchymal brain volume loss. Electronically Signed   By: Sandie Ano  Bloomer M.D.   On: 10/25/2017 14:41   US Abdomen Complete  Result Date: 10/22/2017 CLINICAL DATA:  Abdominal pain for 2 weeks. EXAM: ABDOMEN ULTRASOUND COMPLETE COMPARISON:  CT abdomen pelvis dated November 02, 2015. FINDINGS: Gallbladder: Layering sludge. There are few linear intraluminal membranes. No pericholecystic fluid. No wall thickening visualized. No sonographic Murphy sign noted by sonographer. Common bile duct: Diameter: 4 mm, normal. Liver: No focal lesion identified. Diffusely increased in parenchymal echogenicity. Portal vein is patent on color Doppler imaging with normal direction of blood flow towards the liver. IVC: No abnormality visualized. Pancreas: Visualized portion unremarkable. Spleen: Size and appearance within normal limits. Right Kidney: Length: 10.8 cm. Echogenicity within normal limits. No mass or hydronephrosis visualized. Left Kidney: Length: 10.9 cm. Echogenicity within normal limits. No mass or hydronephrosis visualized. Abdominal aorta: No aneurysm visualized. Other findings: None. IMPRESSION: 1. Layering gallbladder sludge with a few linear intraluminal membranes which could reflect sloughing of the mucosa in the setting of acute cholecystitis. However, there is no other sonographic evidence of acute cholecystitis at this time. Correlate clinically and consider HIDA scan for further evaluation. 2. Hepatic steatosis. Electronically Signed   By: Titus Dubin M.D.   On: 10/22/2017 17:52   US Carotid Bilateral  Result Date: 10/25/2017 CLINICAL DATA:  Dizziness for the past 2 days.  Syncopal episode. EXAM: BILATERAL CAROTID DUPLEX ULTRASOUND TECHNIQUE: Pearline Cables scale imaging, color Doppler and duplex ultrasound were performed of bilateral carotid and vertebral arteries in the neck. COMPARISON:  None. FINDINGS: Criteria: Quantification of carotid stenosis is based on velocity parameters that correlate the residual  internal carotid diameter with NASCET-based stenosis levels, using the diameter of the distal internal carotid lumen as the denominator for stenosis measurement. The following velocity measurements were obtained: RIGHT ICA:  71/26 cm/sec CCA:  16/10 cm/sec SYSTOLIC ICA/CCA RATIO:  1.1 ECA:  69 cm/sec LEFT ICA:  59/23 cm/sec CCA:  96/0 cm/sec SYSTOLIC ICA/CCA RATIO:  0.7 ECA:  74 cm/sec RIGHT CAROTID ARTERY: There is a minimal amount of hypoechoic plaque involving the origin and proximal aspects of the right internal carotid artery (image 22), not resulting in elevated peak systolic velocities within the interrogated course the right internal carotid artery to suggest a hemodynamically significant stenosis. RIGHT VERTEBRAL ARTERY:  Antegrade flow LEFT CAROTID ARTERY: There is a minimal amount of atherosclerotic plaque involving the origin and proximal aspects of the left internal carotid artery (image 54), not resulting in elevated peak systolic velocities within the interrogated course the left internal carotid artery to suggest a hemodynamically significant stenosis. LEFT VERTEBRAL ARTERY:  Antegrade flow IMPRESSION: Minimal amount of bilateral atherosclerotic plaque, not resulting in a hemodynamically significant stenosis within either internal carotid artery. Electronically Signed   By: Sandi Mariscal M.D.   On: 10/25/2017 15:38   Nm Hepato W/eject Fract  Result Date: 10/23/2017 CLINICAL DATA:  Right upper quadrant pain and abnormal gallbladder ultrasound EXAM: NUCLEAR MEDICINE HEPATOBILIARY IMAGING WITH GALLBLADDER EF TECHNIQUE: Sequential images of the abdomen were obtained out to 60 minutes following intravenous administration of radiopharmaceutical. After slow intravenous infusion of 1.12 micrograms Cholecystokinin, gallbladder ejection fraction was determined. RADIOPHARMACEUTICALS:  4.52 mCi Tc-24m Choletec IV COMPARISON:  Ultrasound from the previous day. FINDINGS: Prompt uptake and biliary excretion of  activity by the liver is seen. Gallbladder activity is visualized, consistent with patency of cystic duct. Biliary activity passes into small bowel, consistent with patent common bile duct. Calculated gallbladder ejection fraction is 21%. (At 60 min, normal ejection fraction is greater  than 40%.) IMPRESSION: Normal uptake and excretion of biliary tracer. The gallbladder is well visualized documenting cystic duct patency. Reduced gallbladder ejection fraction of 21%. This may be related to a more chronic component of cholecystitis. Electronically Signed   By: Inez Catalina M.D.   On: 10/23/2017 16:43       Today   Subjective:   Ivan Hamilton  Is a middle aged male patient in no distress Seen and evaluated on day of discharge No chest pain No headache Np more vomitings  Objective:   Blood pressure 97/74, pulse (!) 124, temperature 99.2 F (37.3 C), temperature source Oral, resp. rate 16, height 5\' 5"  (1.651 m), weight 56.6 kg (124 lb 11.2 oz), SpO2 100 %.  . No intake or output data in the 24 hours ending 10/29/17 1557  Exam VITAL SIGNS: Blood pressure 97/74, pulse (!) 124, temperature 99.2 F (37.3 C), temperature source Oral, resp. rate 16, height 5\' 5"  (1.651 m), weight 56.6 kg (124 lb 11.2 oz), SpO2 100 %.  GENERAL:  52 y.o.-year-old patient lying in the bed with no acute distress.  EYES: Pupils equal, round, reactive to light and accommodation. No scleral icterus. Extraocular muscles intact.  HEENT: Head atraumatic, normocephalic. Oropharynx and nasopharynx clear.  NECK:  Supple, no jugular venous distention. No thyroid enlargement, no tenderness.  LUNGS: Normal breath sounds bilaterally, no wheezing, rales,rhonchi or crepitation. No use of accessory muscles of respiration.  CARDIOVASCULAR: S1, S2 normal. No murmurs, rubs, or gallops.  ABDOMEN: Soft, nontender, nondistended. Bowel sounds present. No organomegaly or mass.  EXTREMITIES: No pedal edema, cyanosis, or clubbing.   NEUROLOGIC: Cranial nerves II through XII are intact. Muscle strength 5/5 in all extremities. Sensation intact. Gait not checked.  PSYCHIATRIC: The patient is alert and oriented x 3.  SKIN: No obvious rash, lesion, or ulcer.   Data Review     CBC w Diff:  Lab Results  Component Value Date   WBC 4.8 10/23/2017   HGB 9.7 (L) 10/23/2017   HGB 11.2 (L) 08/17/2014   HCT 28.9 (L) 10/23/2017   HCT 33.8 (L) 08/17/2014   PLT 161 10/23/2017   PLT 320 08/17/2014   LYMPHOPCT 67 06/28/2015   LYMPHOPCT 21.3 08/17/2014   MONOPCT 6 06/28/2015   MONOPCT 18.0 08/17/2014   EOSPCT 1 06/28/2015   EOSPCT 1.4 08/17/2014   BASOPCT 0 06/28/2015   BASOPCT 1.2 08/17/2014   CMP:  Lab Results  Component Value Date   NA 136 10/28/2017   NA 141 08/17/2014   K 3.7 10/28/2017   K 3.6 08/17/2014   CL 106 10/28/2017   CL 106 08/17/2014   CO2 24 10/28/2017   CO2 26 08/17/2014   BUN <5 (L) 10/28/2017   BUN 3 (L) 08/17/2014   CREATININE 0.52 (L) 10/28/2017   CREATININE 0.69 08/17/2014   PROT 6.2 (L) 10/25/2017   PROT 6.4 08/14/2014   ALBUMIN 2.8 (L) 10/25/2017   ALBUMIN 2.6 (L) 08/14/2014   BILITOT 1.8 (H) 10/25/2017   BILITOT 0.9 08/14/2014   ALKPHOS 88 10/25/2017   ALKPHOS 37 (L) 08/14/2014   AST 145 (H) 10/25/2017   AST 25 08/14/2014   ALT 35 10/25/2017   ALT 25 08/14/2014  .  Micro Results Recent Results (from the past 240 hour(s))  CULTURE, BLOOD (ROUTINE X 2) w Reflex to ID Panel     Status: None (Preliminary result)   Collection Time: 10/25/17  5:01 PM  Result Value Ref Range Status   Specimen Description BLOOD  RIGHT ANTECUBITAL  Final   Special Requests   Final    BOTTLES DRAWN AEROBIC AND ANAEROBIC Blood Culture adequate volume   Culture   Final    NO GROWTH 4 DAYS Performed at [facility], [address] [place], [place] [address]    Report Status PENDING  Incomplete  CULTURE, BLOOD (ROUTINE X 2) w Reflex to ID Panel     Status: None (Preliminary result)    Collection Time: 10/25/17  5:11 PM  Result Value Ref Range Status   Specimen Description BLOOD BLOOD RIGHT HAND  Final   Special Requests NONE  Final   Culture   Final    NO GROWTH 4 DAYS Performed at [facility], [address] [place], [place] [address]    Report Status PENDING  Incomplete  Urine Culture     Status: None   Collection Time: 10/25/17  5:25 PM  Result Value Ref Range Status   Specimen Description   Final    URINE, RANDOM Performed at [facility], [address], [place], [place] [address]    Special Requests   Final    NONE Performed at [facility], [address], [place], [place] [address]    Culture   Final    NO GROWTH Performed at [facility], [place] [address], [place], [place] [address]    Report Status 10/27/2017 FINAL  Final     Code Status History    Date Active Date Inactive Code Status Order ID Comments User Context   10/22/2017 1454 10/28/2017 1805 Full Code [id number]  Ivan Shelling, MD ED   01/18/2017 1250 01/18/2017 2140 Full Code [id number]  Orbie Pyo, MD ED   11/02/2015 2000 11/05/2015 1938 Full Code [id number]  Henreitta Leber, MD Inpatient   09/01/2015 1650 09/05/2015 1648 Full Code [id number]  Clapacs, Madie Reno, MD Inpatient          Follow-up Information    Tracie Harrier, MD. Go on 11/06/2017.   Specialty:  Internal Medicine Why:  Appointment Time: 10:30am Contact information: [place] [address] [phone]        [name], MD In 2 weeks.   Specialty:  Gastroenterology Why:  THE OFFICE WILL CALL YOU WITH AN APPOINTMENT. Thanks! Contact information: [place] [address] [phone]           Discharge Medications   Allergies as of 10/28/2017   No Known Allergies     Medication List    STOP taking these medications   chlordiazePOXIDE 25 MG capsule Commonly known as:  LIBRIUM   FLUoxetine 20 MG  capsule Commonly known as:  PROZAC   gabapentin 400 MG capsule Commonly known as:  NEURONTIN   LORazepam 1 MG tablet Commonly known as:  ATIVAN   naltrexone 50 MG tablet Commonly known as:  DEPADE   omeprazole 20 MG capsule Commonly known as:  PRILOSEC     TAKE these medications   aspirin 81 MG chewable tablet Chew 1 tablet (81 mg total) by mouth daily.   busPIRone 15 MG tablet Commonly known as:  BUSPAR Take 15 mg by mouth 2 (two) times daily.   citalopram 20 MG tablet Commonly known as:  CELEXA Take 1 tablet (20 mg total) by mouth daily for 20 days.   feeding supplement (ENSURE ENLIVE) Liqd Take 237 mLs by mouth 2 (two) times daily between meals.   folic acid 1  MG tablet Commonly known as:  FOLVITE Take 1 tablet (1 mg total) by mouth daily.   levothyroxine 100 MCG tablet Commonly known as:  SYNTHROID, LEVOTHROID Take 100 mcg by mouth daily before breakfast.   multivitamin with minerals Tabs tablet Take 1 tablet by mouth daily.   pantoprazole 40 MG tablet Commonly known as:  PROTONIX Take 1 tablet (40 mg total) by mouth 2 (two) times daily before a meal.   QUEtiapine 50 MG tablet Commonly known as:  SEROQUEL Take 1 tablet (50 mg total) by mouth at bedtime.   thiamine 100 MG tablet Take 1 tablet (100 mg total) by mouth daily.      Iron supplements orally    Total Time in preparing paper work, data evaluation and todays exam - 35 minutes  Ivan Hamilton M.D on 10/29/2017 at 3:57 PM [place]  [phone]

## 2017-10-30 LAB — CULTURE, BLOOD (ROUTINE X 2)
Culture: NO GROWTH
Culture: NO GROWTH
Special Requests: ADEQUATE

## 2017-10-31 LAB — VITAMIN B1: Vitamin B1 (Thiamine): 161.9 nmol/L (ref 66.5–200.0)

## 2017-11-02 DIAGNOSIS — Z9049 Acquired absence of other specified parts of digestive tract: Secondary | ICD-10-CM | POA: Insufficient documentation

## 2017-11-04 ENCOUNTER — Telehealth: Payer: Self-pay

## 2017-11-04 NOTE — Telephone Encounter (Signed)
Flagged on EMMI report for being unsure if he received discharge papers, not knowing who to call about changes in condition, and not taking meds.  Patient speaks Spanish, though call indicates it was done in [place].  Attempted to reach patient on 11/04/17 at 2:15pm with the assistance of [employer] (Interpreter: [place], [place]: [place]), however patient unavailable.  VM left indicating I would try to reach him at a later time.

## 2017-11-05 NOTE — Telephone Encounter (Signed)
2nd attempt made to reach patient on 11/06/17 at 4pm with assistance of [employer] [phone]). InterpreterLuiz Ochoa, [place]: [phone].  Unable to reach patient.

## 2017-11-20 ENCOUNTER — Ambulatory Visit: Payer: Medicare Other | Admitting: Gastroenterology

## 2017-11-20 ENCOUNTER — Telehealth: Payer: Self-pay

## 2017-11-20 ENCOUNTER — Other Ambulatory Visit: Payer: Self-pay

## 2017-11-20 ENCOUNTER — Encounter: Payer: Self-pay | Admitting: Gastroenterology

## 2017-11-20 NOTE — Telephone Encounter (Signed)
Pt has been scheduled for a follow up appt. 

## 2017-11-20 NOTE — Telephone Encounter (Signed)
-----   Message from [name], MD sent at 10/30/2017  7:59 AM EDT ----- This patient needs to come in to discuss his pathology and biopsy results

## 2017-12-30 ENCOUNTER — Encounter (INDEPENDENT_AMBULATORY_CARE_PROVIDER_SITE_OTHER): Payer: Self-pay | Admitting: Vascular Surgery

## 2017-12-30 ENCOUNTER — Ambulatory Visit (INDEPENDENT_AMBULATORY_CARE_PROVIDER_SITE_OTHER): Payer: Medicare Other | Admitting: Vascular Surgery

## 2017-12-30 VITALS — BP 126/85 | HR 92 | Resp 17 | Ht 63.0 in | Wt 129.4 lb

## 2017-12-30 DIAGNOSIS — M5137 Other intervertebral disc degeneration, lumbosacral region: Secondary | ICD-10-CM

## 2017-12-30 DIAGNOSIS — I83813 Varicose veins of bilateral lower extremities with pain: Secondary | ICD-10-CM

## 2017-12-30 NOTE — Progress Notes (Signed)
Subjective:    Patient ID: Ivan Hamilton, male    DOB: [birth year], 52 y.o.   MRN: [id number] Chief Complaint  Patient presents with  . New Patient (Initial Visit)    ref hande Varicose Veins   Presents as a new patient referred by Dr. Ginette Pitman for evaluation of bilateral lower extremity varicose veins.  The patient endorses a long-standing history of varicose veins to the bilateral legs, left lower extremity greater than right lower extremity.  The patient notes that he was seen in our practice this approximately 10 years ago and it sounds like he was supposed to undergo sclerotherapy however this never happened.  He presents today with worsening pain and an increase in the size and number of the varicosities noted to his lower extremity especially to his left lower extremity.  The patient notes that the discomfort he is experiencing has progressed to the point that he is unable to function on a daily basis and his symptoms have become lifestyle limiting.  The patient does not engage in conservative therapy at this time including wearing medical grade 1 compression socks, elevating his legs and remaining active.  The patient denies any claudication-like symptoms, rest pain or ulceration to the bilateral lower extremity.  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  The patient notes that he was told by a neurosurgeon he is not a candidate for surgery.  The patient denies any recent surgery or trauma to the bilateral lower extremity.  The patient denies any history of DVT to the bilateral lower extremity.  The patient denies any fever, nausea vomiting.  Review of Systems  Constitutional: Negative.   HENT: Negative.   Eyes: Negative.   Respiratory: Negative.   Cardiovascular:       Painful varicose veins  Gastrointestinal: Negative.   Endocrine: Negative.   Genitourinary: Negative.   Musculoskeletal:  Negative.   Skin: Negative.   Allergic/Immunologic: Negative.   Neurological: Negative.   Hematological: Negative.   Psychiatric/Behavioral: Negative.       Objective:   Physical Exam  Constitutional: He is oriented to person, place, and time. He appears well-developed and well-nourished. No distress.  HENT:  Head: Normocephalic and atraumatic.  Right Ear: External ear normal.  Left Ear: External ear normal.  Eyes: Pupils are equal, round, and reactive to light. Conjunctivae and EOM are normal.  Neck: Normal range of motion.  Cardiovascular: Normal rate, regular rhythm, normal heart sounds and intact distal pulses.  Pulses:      Radial pulses are 2+ on the right side, and 2+ on the left side.       Dorsalis pedis pulses are 2+ on the right side, and 2+ on the left side.       Posterior tibial pulses are 2+ on the right side, and 2+ on the left side.  Pulmonary/Chest: Effort normal and breath sounds normal.  Musculoskeletal: Normal range of motion. He exhibits no edema.  Neurological: He is alert and oriented to person, place, and time.  Skin: He is not diaphoretic.  Greater than 1 cm diffuse varicosities noted to the bilateral lower extremity, left greater than right.  There is no stasis dermatitis, fibrosis, cellulitis or ulcerations noted to the bilateral lower extremity.  Psychiatric: He has a normal mood and affect. His behavior is normal. Judgment and thought content normal.  Vitals reviewed.  BP 126/85 (BP Location: Right Arm)  Pulse 92   Resp 17   Ht 5\' 3"  (1.6 m)   Wt 129 lb 6.4 oz (58.7 kg)   BMI 22.92 kg/m   Past Medical History:  Diagnosis Date  . Alcohol withdrawal ([place])   . B12 deficiency   . Chronic pain   . Hypothyroidism   . Varicose veins    Social History   Socioeconomic History  . Marital status: Married    Spouse name: Not on file  . Number of children: Not on file  . Years of education: Not on file  . Highest education level: Not on file    Occupational History  . Not on file  Social Needs  . Financial resource strain: Not on file  . Food insecurity:    Worry: Not on file    Inability: Not on file  . Transportation needs:    Medical: Not on file    Non-medical: Not on file  Tobacco Use  . Smoking status: Never Smoker  . Smokeless tobacco: Never Used  Substance and Sexual Activity  . Alcohol use: Yes    Comment: daily- 1 pint of vodka  . Drug use: No  . Sexual activity: Not on file  Lifestyle  . Physical activity:    Days per week: Not on file    Minutes per session: Not on file  . Stress: Not on file  Relationships  . Social connections:    Talks on phone: Not on file    Gets together: Not on file    Attends religious service: Not on file    Active member of club or organization: Not on file    Attends meetings of clubs or organizations: Not on file    Relationship status: Not on file  . Intimate partner violence:    Fear of current or ex partner: Not on file    Emotionally abused: Not on file    Physically abused: Not on file    Forced sexual activity: Not on file  Other Topics Concern  . Not on file  Social History Narrative  . Not on file   Past Surgical History:  Procedure Laterality Date  . ESOPHAGOGASTRODUODENOSCOPY (EGD) WITH PROPOFOL N/A 10/24/2017   Procedure: ESOPHAGOGASTRODUODENOSCOPY (EGD) WITH PROPOFOL;  Surgeon: [name], MD;  Location[facility]COPY;  Service: Endoscopy;  Laterality: N/A;  . LEG SURGERY     No family history on file.  No Known Allergies     Assessment & Plan:  Presents as a new patient referred by Dr. Ginette Pitman for evaluation of bilateral lower extremity varicose veins.  The patient endorses a long-standing history of varicose veins to the bilateral legs, left lower extremity greater than right lower extremity.  The patient notes that he was seen in our practice this approximately 10 years ago and it sounds like he was supposed to undergo sclerotherapy however this  never happened.  He presents today with worsening pain and an increase in the size and number of the varicosities noted to his lower extremity especially to his left lower extremity.  The patient notes that the discomfort he is experiencing has progressed to the point that he is unable to function on a daily basis and his symptoms have become lifestyle limiting.  The patient does not engage in conservative therapy at this time including wearing medical grade 1 compression socks, elevating his legs and remaining active.  The patient denies any claudication-like symptoms, rest pain or ulceration to the bilateral lower extremity.  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  The patient notes that he was told by a neurosurgeon he is not a candidate for surgery.  The patient denies any recent surgery or trauma to the bilateral lower extremity.  The patient denies any history of DVT to the bilateral lower extremity.  The patient denies any fever, nausea vomiting.  1. Varicose veins of both lower extremities with pain - New The patient was encouraged to wear graduated compression stockings (20-30 mmHg) on a daily basis. The patient was instructed to begin wearing the stockings first thing in the morning and removing them in the evening. The patient was instructed specifically not to sleep in the stockings. Prescription given. In addition, behavioral modification including elevation during the day will be initiated. Anti-inflammatories for pain. I will bring the patient back to have him undergo bilateral lower extremity venous duplex to rule out any contributing venous versus lymphatic disease. The patient will follow up in three months to asses conservative management.  Information oncompression stockings was given to the patient. The patient was instructed to call the office in the interim if any worsening edema or  ulcerations to the legs, feet or toes occurs. The patient expresses their understanding.  - VAS [place] LOWER EXTREMITY VENOUS REFLUX; Future  2. DDD (degenerative disc disease), lumbosacral - Stable This may also be a contributing factor to the patient's left lower extremity discomfort. This is followed by the patient's neurosurgeon  Current Outpatient Medications on File Prior to Visit  Medication Sig Dispense Refill  . busPIRone (BUSPAR) 15 MG tablet Take 15 mg by mouth 2 (two) times daily.    Marland Kitchen gabapentin (NEURONTIN) 400 MG capsule Take 400 mg by mouth.    . levothyroxine (SYNTHROID, LEVOTHROID) 100 MCG tablet Take 100 mcg by mouth daily before breakfast.    . oxyCODONE-acetaminophen (PERCOCET/ROXICET) 5-325 MG tablet Take 1 tablet by mouth every 6 (six) hours as needed. for pain  0  . aspirin 81 MG chewable tablet Chew 1 tablet (81 mg total) by mouth daily. (Patient not taking: Reported on 12/30/2017) 30 tablet 1  . citalopram (CELEXA) 20 MG tablet Take 1 tablet (20 mg total) by mouth daily for 20 days. 20 tablet 0  . feeding supplement, ENSURE ENLIVE, (ENSURE ENLIVE) LIQD Take 237 mLs by mouth 2 (two) times daily between meals. (Patient not taking: Reported on 12/30/2017) 237 mL 12  . FLUoxetine (PROZAC) 20 MG tablet Take 20 mg by mouth.    . hydrOXYzine (ATARAX/VISTARIL) 50 MG tablet Take 50 mg by mouth.    . IRON PO Take by mouth.    Marland Kitchen omeprazole (PRILOSEC) 20 MG capsule Take by mouth.    . pantoprazole (PROTONIX) 40 MG tablet Take 1 tablet (40 mg total) by mouth 2 (two) times daily before a meal. 60 tablet 1  . QUEtiapine (SEROQUEL) 50 MG tablet Take 1 tablet (50 mg total) by mouth at bedtime. 30 tablet 0   No current facility-administered medications on file prior to visit.    There are no Patient Instructions on file for this visit. No follow-ups on file.  KIMBERLY A STEGMAYER, PA-C

## 2018-04-21 ENCOUNTER — Encounter (INDEPENDENT_AMBULATORY_CARE_PROVIDER_SITE_OTHER): Payer: Medicare Other

## 2018-04-21 ENCOUNTER — Ambulatory Visit (INDEPENDENT_AMBULATORY_CARE_PROVIDER_SITE_OTHER): Payer: Medicare Other | Admitting: Vascular Surgery

## 2018-08-27 ENCOUNTER — Emergency Department
Admission: EM | Admit: 2018-08-27 | Discharge: 2018-08-28 | Disposition: A | Discharge status: Other Acute Inpatient Hospital | Payer: Medicare HMO | Attending: Emergency Medicine | Admitting: Emergency Medicine

## 2018-08-27 ENCOUNTER — Other Ambulatory Visit: Payer: Self-pay

## 2018-08-27 ENCOUNTER — Encounter: Payer: Self-pay | Admitting: Emergency Medicine

## 2018-08-27 DIAGNOSIS — F10929 Alcohol use, unspecified with intoxication, unspecified: Secondary | ICD-10-CM

## 2018-08-27 DIAGNOSIS — R441 Visual hallucinations: Secondary | ICD-10-CM | POA: Diagnosis present

## 2018-08-27 DIAGNOSIS — R443 Hallucinations, unspecified: Secondary | ICD-10-CM

## 2018-08-27 DIAGNOSIS — I10 Essential (primary) hypertension: Secondary | ICD-10-CM | POA: Insufficient documentation

## 2018-08-27 DIAGNOSIS — F10229 Alcohol dependence with intoxication, unspecified: Secondary | ICD-10-CM | POA: Diagnosis not present

## 2018-08-27 DIAGNOSIS — F323 Major depressive disorder, single episode, severe with psychotic features: Secondary | ICD-10-CM | POA: Diagnosis not present

## 2018-08-27 DIAGNOSIS — Z79899 Other long term (current) drug therapy: Secondary | ICD-10-CM | POA: Diagnosis not present

## 2018-08-27 DIAGNOSIS — Y908 Blood alcohol level of 240 mg/100 ml or more: Secondary | ICD-10-CM | POA: Insufficient documentation

## 2018-08-27 DIAGNOSIS — R45851 Suicidal ideations: Secondary | ICD-10-CM | POA: Insufficient documentation

## 2018-08-27 DIAGNOSIS — R4689 Other symptoms and signs involving appearance and behavior: Secondary | ICD-10-CM

## 2018-08-27 DIAGNOSIS — E039 Hypothyroidism, unspecified: Secondary | ICD-10-CM | POA: Insufficient documentation

## 2018-08-27 DIAGNOSIS — R4589 Other symptoms and signs involving emotional state: Secondary | ICD-10-CM | POA: Diagnosis present

## 2018-08-27 DIAGNOSIS — Z008 Encounter for other general examination: Secondary | ICD-10-CM | POA: Diagnosis not present

## 2018-08-27 HISTORY — DX: Essential (primary) hypertension: I10

## 2018-08-27 HISTORY — DX: Cerebral infarction, unspecified: I63.9

## 2018-08-27 LAB — CBC
HCT: 42.4 % (ref 39.0–52.0)
Hemoglobin: 14.1 g/dL (ref 13.0–17.0)
MCH: 29.9 pg (ref 26.0–34.0)
MCHC: 33.3 g/dL (ref 30.0–36.0)
MCV: 90 fL (ref 80.0–100.0)
Platelets: 235 10*3/uL (ref 150–400)
RBC: 4.71 MIL/uL (ref 4.22–5.81)
RDW: 14.8 % (ref 11.5–15.5)
WBC: 8.2 10*3/uL (ref 4.0–10.5)
nRBC: 0 % (ref 0.0–0.2)

## 2018-08-27 LAB — URINE DRUG SCREEN, QUALITATIVE (ARMC ONLY)
Amphetamines, Ur Screen: NOT DETECTED
Barbiturates, Ur Screen: NOT DETECTED
Benzodiazepine, Ur Scrn: NOT DETECTED
Cannabinoid 50 Ng, Ur ~~LOC~~: NOT DETECTED
Cocaine Metabolite,Ur ~~LOC~~: NOT DETECTED
MDMA (Ecstasy)Ur Screen: NOT DETECTED
Methadone Scn, Ur: NOT DETECTED
Opiate, Ur Screen: NOT DETECTED
Phencyclidine (PCP) Ur S: NOT DETECTED
Tricyclic, Ur Screen: NOT DETECTED

## 2018-08-27 LAB — COMPREHENSIVE METABOLIC PANEL
ALT: 66 U/L — ABNORMAL HIGH (ref 0–44)
AST: 64 U/L — ABNORMAL HIGH (ref 15–41)
Albumin: 4.1 g/dL (ref 3.5–5.0)
Alkaline Phosphatase: 70 U/L (ref 38–126)
Anion gap: 5 (ref 5–15)
BUN: 10 mg/dL (ref 6–20)
CO2: 26 mmol/L (ref 22–32)
Calcium: 8.9 mg/dL (ref 8.9–10.3)
Chloride: 110 mmol/L (ref 98–111)
Creatinine, Ser: 0.91 mg/dL (ref 0.61–1.24)
GFR calc Af Amer: >60 mL/min (ref >60)
GFR calc non Af Amer: >60 mL/min (ref >60)
Glucose, Bld: 96 mg/dL (ref 70–99)
Potassium: 4.5 mmol/L (ref 3.5–5.1)
Sodium: 141 mmol/L (ref 135–145)
Total Bilirubin: 0.4 mg/dL (ref 0.3–1.2)
Total Protein: 8.5 g/dL — ABNORMAL HIGH (ref 6.5–8.1)

## 2018-08-27 LAB — ETHANOL: Alcohol, Ethyl (B): 285 mg/dL — ABNORMAL HIGH (ref <10)

## 2018-08-27 LAB — ACETAMINOPHEN LEVEL: Acetaminophen (Tylenol), Serum: <10 ug/mL — ABNORMAL LOW (ref 10–30)

## 2018-08-27 LAB — SALICYLATE LEVEL: Salicylate Lvl: <7 mg/dL (ref 2.8–30.0)

## 2018-08-27 MED ORDER — GABAPENTIN 300 MG PO CAPS
400.0000 mg | ORAL_CAPSULE | Freq: Three times a day (TID) | ORAL | Status: DC
  Administered 2018-08-28: 400 mg via ORAL
  Filled 2018-08-27: qty 1

## 2018-08-27 MED ORDER — LEVOTHYROXINE SODIUM 75 MCG PO TABS
125.0000 ug | ORAL_TABLET | Freq: Every day | ORAL | Status: DC
  Administered 2018-08-28: 125 ug via ORAL
  Filled 2018-08-27: qty 2

## 2018-08-27 MED ORDER — LORAZEPAM 2 MG/ML IJ SOLN: 0.0000 mg | Freq: Four times a day (QID) | INTRAMUSCULAR | Status: DC

## 2018-08-27 MED ORDER — LORAZEPAM 2 MG PO TABS: 0.0000 mg | ORAL_TABLET | Freq: Four times a day (QID) | ORAL | Status: DC

## 2018-08-27 MED ORDER — HYDROXYZINE HCL 25 MG PO TABS: 50.0000 mg | ORAL_TABLET | Freq: Three times a day (TID) | ORAL | Status: DC | PRN

## 2018-08-27 MED ORDER — LORAZEPAM 2 MG/ML IJ SOLN: 0.0000 mg | Freq: Two times a day (BID) | INTRAMUSCULAR | Status: DC

## 2018-08-27 MED ORDER — THIAMINE HCL 100 MG/ML IJ SOLN: 100.0000 mg | Freq: Every day | INTRAMUSCULAR | Status: DC

## 2018-08-27 MED ORDER — VITAMIN B-1 100 MG PO TABS
100.0000 mg | ORAL_TABLET | Freq: Every day | ORAL | Status: DC
  Administered 2018-08-28: 100 mg via ORAL
  Filled 2018-08-27: qty 1

## 2018-08-27 MED ORDER — FLUOXETINE HCL 20 MG PO CAPS
20.0000 mg | ORAL_CAPSULE | Freq: Every day | ORAL | Status: DC
  Administered 2018-08-28: 20 mg via ORAL
  Filled 2018-08-27: qty 1

## 2018-08-27 MED ORDER — BUSPIRONE HCL 10 MG PO TABS
15.0000 mg | ORAL_TABLET | Freq: Two times a day (BID) | ORAL | Status: DC
  Administered 2018-08-28 (×2): 15 mg via ORAL
  Filled 2018-08-27 (×2): qty 2

## 2018-08-27 MED ORDER — LORAZEPAM 2 MG PO TABS: 0.0000 mg | ORAL_TABLET | Freq: Two times a day (BID) | ORAL | Status: DC

## 2018-08-27 MED ORDER — TRAZODONE HCL 100 MG PO TABS
100.0000 mg | ORAL_TABLET | Freq: Every day | ORAL | Status: DC
  Administered 2018-08-28: 100 mg via ORAL
  Filled 2018-08-27: qty 1

## 2018-08-27 NOTE — BH Assessment (Signed)
Assessment Note  Dimetri Armitage is an 53 y.o. male Pt refused assessment with this writer stating "I just want to go how. I don't know what I'm doing here."  Per triage RN: Pt arrived with daughter to ED with Suicidal Ideation.  Has not wanted to get off the couch for the past week and has seemed altered intermittently all day today per daughter. Pt told his daughter he took pills today to end his life. Pt states he took pills and drank ETOH to make the people he is seeing to go away. Pt reports "they were telling me to go with them". Scratch noted to head and pt states he was scratching his head while he was thinking if he "was going to go with them or not". Pt reports "they sit down with me and start telling me to go with them". Mobile crisis unit called and came to their home and was concerned for pt safety. Pt told his daughter "he took the keys to the spare car and drove into traffic because he is wanting to die". Pt states he is here now because he is wanting to stop those thoughts.   Diagnosis: Altered Mental Status  Past Medical History:  Past Medical History:  Diagnosis Date  . Alcohol withdrawal ([place])   . B12 deficiency   . Chronic pain   . CVA (cerebral vascular accident) ([place])   . Hypertension   . Hypothyroidism   . Varicose veins     Past Surgical History:  Procedure Laterality Date  . CHOLECYSTECTOMY    . ESOPHAGOGASTRODUODENOSCOPY (EGD) WITH PROPOFOL N/A 10/24/2017   Procedure: ESOPHAGOGASTRODUODENOSCOPY (EGD) WITH PROPOFOL;  Surgeon: [name], MD;  Location: [facility] ENDOSCOPY;  Service: Endoscopy;  Laterality: N/A;  . LEG SURGERY      Family History: No family history on file.  Social History:  reports that he has never smoked. He has never used smokeless tobacco. He reports current alcohol use. He reports that he does not use drugs.  Additional Social History:     CIWA: CIWA-Ar BP: 106/67 Pulse Rate: 64 COWS:    Allergies: No Known Allergies  Home  Medications: (Not in a hospital admission)   OB/GYN Status:  No LMP for male patient.  General Assessment Data Assessment unable to be completed: Yes Reason for not completing assessment: Pt refused assessment. Pt ststaes "I don't know why I'm here and I just want to go home".  Admission Status: Involuntary                    Mental Status Report Motor Activity: Freedom of movement                            Advance Directives (For Healthcare) Does Patient Have a Medical Advance Directive?: No Would patient like information on creating a medical advance directive?: No - Patient declined          Disposition:     On Site Evaluation by:   Reviewed with Physician:    Nasser Ku D Hamsini Verrilli 08/27/2018 11:22 PM

## 2018-08-27 NOTE — ED Notes (Signed)
Patient said he has PTSD and hasn't been taking his medication for two weeks. He reported SI and AVH. He said he lives with his daughter and granddaughter. Daughter brought him to the hospital.

## 2018-08-27 NOTE — ED Provider Notes (Signed)
[facility] Emergency Department Provider Note   ____________________________________________   First MD Initiated Contact with Patient 08/27/18 2152     (approximate)  I have reviewed the triage vital signs and the nursing notes.   HISTORY  Chief Complaint Suicidal    HPI Ivan Hamilton is a 53 y.o. male patient tells me he had taken his trazodone in the morning instead of in the evening and then he was sitting down and all of a sudden saw a dead friend who is signing come with me to come with me from what I understand and he began drinking to make the friend go away.  He came in here says he feels fine now however he is intoxicated his alcohol level is 285.  He denies any other complaints or problems.  Family had reportedly said he was suicidal.   Past Medical History:  Diagnosis Date  . Alcohol withdrawal ([place])   . B12 deficiency   . Chronic pain   . CVA (cerebral vascular accident) ([place])   . Hypertension   . Hypothyroidism   . Varicose veins     Patient Active Problem List   Diagnosis Date Noted  . Duodenitis   . Gastritis without bleeding   . Hypokalemia 10/22/2017  . Intractable vomiting with nausea   . Depression 04/10/2016  . Alcohol withdrawal delirium ([place])   . Alcohol withdrawal ([place]) 09/02/2015  . Alcohol use disorder, severe, dependence ([place]) 09/02/2015  . 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 PTSD (post-traumatic stress disorder) 09/01/2015  . Hypothyroid 09/01/2015  . Gastric reflux 09/01/2015  . DDD (degenerative disc disease), lumbosacral 09/01/2015  . 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 Vitamin B12 deficiency 08/17/2015  . Pain of lumbar spine 05/19/2015  . 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 Varicose vein of leg 04/25/2015  . Abnormal LFTs (liver function tests) 03/31/2015  . Confusion state  03/31/2015  . Chronic pain of left lower extremity 03/31/2015    Past Surgical History:  Procedure Laterality Date  . CHOLECYSTECTOMY    . ESOPHAGOGASTRODUODENOSCOPY (EGD) WITH PROPOFOL N/A 10/24/2017   Procedure: ESOPHAGOGASTRODUODENOSCOPY (EGD) WITH PROPOFOL;  Surgeon: [name], MD;  Location: [facility] ENDOSCOPY;  Service: Endoscopy;  Laterality: N/A;  . LEG SURGERY      Prior to Admission medications   Medication Sig Start Date End Date Taking? Authorizing Provider  busPIRone (BUSPAR) 15 MG tablet Take 15 mg by mouth 2 (two) times daily. 08/30/17  Yes [provider]  FLUoxetine (PROZAC) 20 MG tablet Take 20 mg by mouth daily.    Yes [provider]  gabapentin (NEURONTIN) 400 MG capsule Take 400 mg by mouth 3 (three) times daily.  01/31/16  Yes [provider]  hydrOXYzine (ATARAX/VISTARIL) 50 MG tablet Take 50 mg by mouth 3 (three) times daily as needed.    Yes [provider]  levothyroxine (SYNTHROID, LEVOTHROID) 125 MCG tablet Take 125 mcg by mouth daily before breakfast.    Yes [provider]  omeprazole (PRILOSEC) 20 MG capsule Take 20 mg by mouth daily.  03/12/17  Yes [provider]  traZODone (DESYREL) 50 MG tablet Take 100 mg by mouth at bedtime. 05/06/18  Yes [provider]  aspirin 81 MG chewable tablet Chew 1 tablet (81 mg total) by mouth daily. Patient not taking: Reported on 12/30/2017 10/29/17   Saundra Shelling, MD  citalopram (CELEXA) 20 MG tablet  Take 1 tablet (20 mg total) by mouth daily for 20 days. 10/29/17 11/18/17  Saundra Shelling, MD  feeding supplement, ENSURE ENLIVE, (ENSURE ENLIVE) LIQD Take 237 mLs by mouth 2 (two) times daily between meals. Patient not taking: Reported on 12/30/2017 10/28/17   Saundra Shelling, MD  IRON PO Take by mouth. 04/13/16   [provider]  oxyCODONE-acetaminophen (PERCOCET/ROXICET) 5-325 MG tablet Take 1 tablet by mouth every 6 (six) hours as needed. for pain 11/20/17   [provider]  pantoprazole (PROTONIX) 40 MG tablet Take 1 tablet (40 mg total) by mouth 2 (two) times daily before a meal. 10/28/17 11/27/17  Pyreddy, Reatha Harps, MD  QUEtiapine (SEROQUEL) 50 MG tablet Take 1 tablet (50 mg total) by mouth at bedtime. 10/28/17 11/27/17  Saundra Shelling, MD    Allergies Patient has no known allergies.  No family history on file.  Social History Social History   Tobacco Use  . Smoking status: Never Smoker  . Smokeless tobacco: Never Used  Substance Use Topics  . Alcohol use: Yes    Comment: daily- 1 pint of vodka  . Drug use: No    Review of systems Constitutional: No fever/chills Eyes: No visual changes. ENT: No sore throat. Cardiovascular: Denies chest pain. Respiratory: Denies shortness of breath. Gastrointestinal: No abdominal pain.  No nausea, no vomiting.  No diarrhea.  No constipation. Genitourinary: Negative for dysuria. Musculoskeletal: Negative for back pain. Skin: Negative for rash. Neurological: Negative for headaches, focal weakness  ____________________________________________   PHYSICAL EXAM:  VITAL SIGNS: ED Triage Vitals  Enc Vitals Group     BP 08/27/18 2132 106/67     Pulse Rate 08/27/18 2132 64     Resp 08/27/18 2132 18     Temp 08/27/18 2132 98.1 F (36.7 C)     Temp Source 08/27/18 2132 Oral     SpO2 08/27/18 2132 96 %     Weight 08/27/18 2127 140 lb (63.5 kg)     Height 08/27/18 2127 5\' 3"  (1.6 m)     Head Circumference --      Peak Flow --      Pain Score 08/27/18 2127 0     Pain Loc --      Pain Edu? --      Excl. in [place]? --     Constitutional: Alert and oriented. Well appearing and in no acute distress. Eyes: Conjunctivae are normal. PERRL. EOMI. Head: Atraumatic. Nose: No congestion/rhinnorhea. Mouth/Throat: Mucous membranes are moist.  Oropharynx non-erythematous. Neck: No stridor Cardiovascular: Normal rate, regular rhythm. Grossly normal heart sounds.  Good peripheral circulation. Respiratory: Normal  respiratory effort.  No retractions. Lungs CTAB. Gastrointestinal: Soft and nontender. No distention. No abdominal bruits. No CVA tenderness. Musculoskeletal: No lower extremity tenderness nor edema.   Neurologic:  Normal speech and language. No gross focal neurologic deficits are appreciated.  Skin:  Skin is warm, dry and intact. No rash noted.   ____________________________________________   LABS (all labs ordered are listed, but only abnormal results are displayed)  Labs Reviewed  COMPREHENSIVE METABOLIC PANEL - Abnormal; Notable for the following components:      Result Value   Total Protein 8.5 (*)    AST 64 (*)    ALT 66 (*)    All other components within normal limits  ETHANOL - Abnormal; Notable for the following components:   Alcohol, Ethyl (B) 285 (*)    All other components within normal limits  ACETAMINOPHEN LEVEL - Abnormal; Notable for the  following components:   Acetaminophen (Tylenol), Serum <10 (*)    All other components within normal limits  SALICYLATE LEVEL  CBC  URINE DRUG SCREEN, QUALITATIVE ([facility] ONLY)   ____________________________________________  EKG   ____________________________________________  RADIOLOGY  ED MD interpretation:    Official radiology report(s): No results found.  ____________________________________________   PROCEDURES  Procedure(s) performed:   Procedures  Critical Care performed:   ____________________________________________   INITIAL IMPRESSION / ASSESSMENT AND PLAN / ED COURSE  We will put patient on [place] protocol watch until he becomes sober and have psychiatry talk to him to make sure there is nothing else going on.         ____________________________________________   FINAL CLINICAL IMPRESSION(S) / ED DIAGNOSES  Final diagnoses:  Hallucinations  Alcoholic intoxication with complication [facility])     ED Discharge Orders    None       Note:  This document was prepared using Dragon  voice recognition software and may include unintentional dictation errors.    Nena Polio, MD 08/27/18 [phone]

## 2018-08-27 NOTE — ED Triage Notes (Addendum)
Pt arrived with daughter to ED with Suicidal Ideation.  Has not wanted to get off the couch for the past week and has seemed altered intermittently all day today per daughter. Pt told his daughter he took pills today to end his life. Pt states he took pills and drank ETOH to make the people he is seeing to go away. Pt reports "they were telling me to go with them". Scratch noted to head and pt states he was scratching his head while he was thinking if he "was going to go with them or not". Pt reports "they sit down with me and start telling me to go with them". Mobile crisis unit called and came to their home and was concerned for pt safety. Pt told his daughter "he took the keys to the spare car and drove into traffic because he is wanting to die". Pt states he is here now because he is wanting to stop those thoughts.

## 2018-08-28 ENCOUNTER — Encounter: Payer: Self-pay | Admitting: Psychiatry

## 2018-08-28 ENCOUNTER — Encounter: Payer: Self-pay | Admitting: Behavioral Health

## 2018-08-28 ENCOUNTER — Inpatient Hospital Stay
Admission: AD | Admit: 2018-08-28 | Discharge: 2018-09-01 | DRG: 885 | Disposition: A | Discharge status: Home / Self Care | Payer: Medicare HMO | Source: Intra-hospital | Attending: Psychiatry | Admitting: Psychiatry

## 2018-08-28 ENCOUNTER — Other Ambulatory Visit: Payer: Self-pay

## 2018-08-28 DIAGNOSIS — E039 Hypothyroidism, unspecified: Secondary | ICD-10-CM | POA: Diagnosis present

## 2018-08-28 DIAGNOSIS — I10 Essential (primary) hypertension: Secondary | ICD-10-CM | POA: Diagnosis present

## 2018-08-28 DIAGNOSIS — Z23 Encounter for immunization: Secondary | ICD-10-CM | POA: Diagnosis not present

## 2018-08-28 DIAGNOSIS — Z8673 Personal history of transient ischemic attack (TIA), and cerebral infarction without residual deficits: Secondary | ICD-10-CM

## 2018-08-28 DIAGNOSIS — Z79890 Hormone replacement therapy (postmenopausal): Secondary | ICD-10-CM

## 2018-08-28 DIAGNOSIS — F1020 Alcohol dependence, uncomplicated: Secondary | ICD-10-CM | POA: Diagnosis present

## 2018-08-28 DIAGNOSIS — Z915 Personal history of self-harm: Secondary | ICD-10-CM

## 2018-08-28 DIAGNOSIS — F10929 Alcohol use, unspecified with intoxication, unspecified: Secondary | ICD-10-CM | POA: Diagnosis present

## 2018-08-28 DIAGNOSIS — F4310 Post-traumatic stress disorder, unspecified: Secondary | ICD-10-CM | POA: Diagnosis present

## 2018-08-28 DIAGNOSIS — R4689 Other symptoms and signs involving appearance and behavior: Secondary | ICD-10-CM

## 2018-08-28 DIAGNOSIS — Z9114 Patient's other noncompliance with medication regimen: Secondary | ICD-10-CM | POA: Diagnosis not present

## 2018-08-28 DIAGNOSIS — R4589 Other symptoms and signs involving emotional state: Secondary | ICD-10-CM | POA: Diagnosis present

## 2018-08-28 DIAGNOSIS — F333 Major depressive disorder, recurrent, severe with psychotic symptoms: Secondary | ICD-10-CM | POA: Diagnosis present

## 2018-08-28 DIAGNOSIS — F17200 Nicotine dependence, unspecified, uncomplicated: Secondary | ICD-10-CM | POA: Diagnosis present

## 2018-08-28 DIAGNOSIS — R441 Visual hallucinations: Secondary | ICD-10-CM | POA: Diagnosis not present

## 2018-08-28 DIAGNOSIS — R45851 Suicidal ideations: Secondary | ICD-10-CM

## 2018-08-28 MED ORDER — PNEUMOCOCCAL VAC POLYVALENT 25 MCG/0.5ML IJ INJ
0.5000 mL | INJECTION | INTRAMUSCULAR | Status: AC
  Administered 2018-08-29: 0.5 mL via INTRAMUSCULAR
  Filled 2018-08-28: qty 0.5

## 2018-08-28 MED ORDER — LORAZEPAM 2 MG/ML IJ SOLN: 0.0000 mg | Freq: Two times a day (BID) | INTRAMUSCULAR | Status: DC

## 2018-08-28 MED ORDER — LORAZEPAM 2 MG/ML IJ SOLN: 0.0000 mg | Freq: Four times a day (QID) | INTRAMUSCULAR | Status: DC

## 2018-08-28 MED ORDER — ASPIRIN 81 MG PO CHEW
81.0000 mg | CHEWABLE_TABLET | Freq: Every day | ORAL | Status: DC
  Administered 2018-08-28 – 2018-09-01 (×5): 81 mg via ORAL
  Filled 2018-08-28 (×5): qty 1

## 2018-08-28 MED ORDER — BUSPIRONE HCL 5 MG PO TABS: 15.0000 mg | ORAL_TABLET | Freq: Two times a day (BID) | ORAL | Status: DC

## 2018-08-28 MED ORDER — LORAZEPAM 2 MG PO TABS: 0.0000 mg | ORAL_TABLET | Freq: Two times a day (BID) | ORAL | Status: DC

## 2018-08-28 MED ORDER — HYDROXYZINE HCL 50 MG PO TABS: 50.0000 mg | ORAL_TABLET | Freq: Three times a day (TID) | ORAL | Status: DC | PRN

## 2018-08-28 MED ORDER — LORAZEPAM 2 MG PO TABS: 0.0000 mg | ORAL_TABLET | Freq: Four times a day (QID) | ORAL | Status: DC

## 2018-08-28 MED ORDER — TRAZODONE HCL 100 MG PO TABS
100.0000 mg | ORAL_TABLET | Freq: Every day | ORAL | Status: DC
  Administered 2018-08-28 – 2018-08-29 (×2): 100 mg via ORAL
  Filled 2018-08-28 (×2): qty 1

## 2018-08-28 MED ORDER — VITAMIN B-1 100 MG PO TABS
100.0000 mg | ORAL_TABLET | Freq: Every day | ORAL | Status: DC
  Administered 2018-08-29 – 2018-09-01 (×4): 100 mg via ORAL
  Filled 2018-08-28 (×4): qty 1

## 2018-08-28 MED ORDER — FLUOXETINE HCL 20 MG PO CAPS
20.0000 mg | ORAL_CAPSULE | Freq: Every day | ORAL | Status: DC
  Administered 2018-08-29 – 2018-08-30 (×2): 20 mg via ORAL
  Filled 2018-08-28 (×2): qty 1

## 2018-08-28 MED ORDER — ACETAMINOPHEN 325 MG PO TABS
650.0000 mg | ORAL_TABLET | Freq: Four times a day (QID) | ORAL | Status: DC | PRN
  Administered 2018-08-30 (×2): 650 mg via ORAL
  Filled 2018-08-28 (×2): qty 2

## 2018-08-28 MED ORDER — TRAZODONE HCL 50 MG PO TABS: 50.0000 mg | ORAL_TABLET | Freq: Every evening | ORAL | Status: DC | PRN

## 2018-08-28 MED ORDER — PANTOPRAZOLE SODIUM 40 MG PO TBEC
40.0000 mg | DELAYED_RELEASE_TABLET | Freq: Every day | ORAL | Status: DC
  Administered 2018-08-28 – 2018-08-31 (×4): 40 mg via ORAL
  Filled 2018-08-28 (×4): qty 1

## 2018-08-28 MED ORDER — LEVOTHYROXINE SODIUM 75 MCG PO TABS
125.0000 ug | ORAL_TABLET | Freq: Every day | ORAL | Status: DC
  Administered 2018-08-29 – 2018-09-01 (×4): 125 ug via ORAL
  Filled 2018-08-28 (×4): qty 1

## 2018-08-28 MED ORDER — QUETIAPINE FUMARATE 25 MG PO TABS
50.0000 mg | ORAL_TABLET | Freq: Every day | ORAL | Status: DC
  Administered 2018-08-28: 50 mg via ORAL
  Filled 2018-08-28: qty 2

## 2018-08-28 MED ORDER — THIAMINE HCL 100 MG/ML IJ SOLN: 100.0000 mg | Freq: Every day | INTRAMUSCULAR | Status: DC

## 2018-08-28 MED ORDER — INFLUENZA VAC SPLIT QUAD 0.5 ML IM SUSY
0.5000 mL | PREFILLED_SYRINGE | INTRAMUSCULAR | Status: AC
  Administered 2018-08-29: 0.5 mL via INTRAMUSCULAR
  Filled 2018-08-28: qty 0.5

## 2018-08-28 MED ORDER — ALUM & MAG HYDROXIDE-SIMETH 200-200-20 MG/5ML PO SUSP: 30.0000 mL | ORAL | Status: DC | PRN

## 2018-08-28 MED ORDER — HYDROXYZINE HCL 25 MG PO TABS: 25.0000 mg | ORAL_TABLET | Freq: Three times a day (TID) | ORAL | Status: DC | PRN

## 2018-08-28 MED ORDER — MAGNESIUM HYDROXIDE 400 MG/5ML PO SUSP: 30.0000 mL | Freq: Every day | ORAL | Status: DC | PRN

## 2018-08-28 MED ORDER — GABAPENTIN 400 MG PO CAPS
400.0000 mg | ORAL_CAPSULE | Freq: Three times a day (TID) | ORAL | Status: DC
  Administered 2018-08-29 – 2018-08-30 (×4): 400 mg via ORAL
  Filled 2018-08-28 (×4): qty 1

## 2018-08-28 NOTE — ED Notes (Signed)
Hourly rounding reveals patient in room. No complaints, stable, in no acute distress. Q15 minute rounds and monitoring via [employer] to continue. 

## 2018-08-28 NOTE — BHH Suicide Risk Assessment (Addendum)
[facility] Admission Suicide Risk Assessment   Nursing information obtained from:    Demographic factors:    Current Mental Status:    Loss Factors:    Historical Factors:    Risk Reduction Factors:     Total Time spent with patient: 1 hour Principal Problem: Severe recurrent major depressive disorder with psychotic features ([place]) Diagnosis:  Principal Problem:   Severe recurrent major depressive disorder with psychotic features ([place]) Active Problems:   Hypothyroid   Alcohol use disorder, severe, dependence ([place])   Alcohol intoxication ([place])   Suicidal ideation  Subjective Data: suicidal ideation  Continued Clinical Symptoms:  Alcohol Use Disorder Identification Test Final Score (AUDIT): 22 The "Alcohol Use Disorders Identification Test", Guidelines for Use in Primary Care, Second Edition.  World [facility]). Score between 0-7:  no or low risk or alcohol related problems. Score between 8-15:  moderate risk of alcohol related problems. Score between 16-19:  high risk of alcohol related problems. Score 20 or above:  warrants further diagnostic evaluation for alcohol dependence and treatment.   CLINICAL FACTORS:   Depression:   Comorbid alcohol abuse/dependence Impulsivity Alcohol/Substance Abuse/Dependencies   Musculoskeletal: Strength & Muscle Tone: within normal limits Gait & Station: normal Patient leans: N/A  Psychiatric Specialty Exam: Physical Exam  Nursing note and vitals reviewed. Psychiatric: He has a normal mood and affect. His speech is normal and behavior is normal. Thought content normal. Cognition and memory are impaired. He expresses impulsivity.    Review of Systems  Neurological: Negative.   Psychiatric/Behavioral: Positive for substance abuse.  All other systems reviewed and are negative.   Blood pressure (!) 135/98, pulse 61, temperature 98.1 F (36.7 C), temperature source Oral, resp. rate 20, height 5\' 3"  (1.6 m), weight 63.5 kg, SpO2 100  %.Body mass index is 24.8 kg/m.  General Appearance: Casual  Eye Contact:  Good  Speech:  Clear and Coherent  Volume:  Normal  Mood:  Euthymic  Affect:  Flat  Thought Process:  Goal Directed and Descriptions of Associations: Intact  Orientation:  Full (Time, Place, and Person)  Thought Content:  WDL  Suicidal Thoughts:  No  Homicidal Thoughts:  No  Memory:  Immediate;   Poor Recent;   Poor Remote;   Poor  Judgement:  Poor  Insight:  Lacking  Psychomotor Activity:  Psychomotor Retardation  Concentration:  Concentration: Poor and Attention Span: Poor  Recall:  Poor  Fund of Knowledge:  Fair  Language:  Fair  Akathisia:  No  Handed:  Right  AIMS (if indicated):     Assets:  Communication Skills Desire for Improvement Financial Resources/Insurance Housing Resilience Social Support  ADL's:  Intact  Cognition:  WNL  Sleep:  Number of Hours: 6.45      COGNITIVE FEATURES THAT CONTRIBUTE TO RISK:  None    SUICIDE RISK:   Moderate:  Frequent suicidal ideation with limited intensity, and duration, some specificity in terms of plans, no associated intent, good self-control, limited dysphoria/symptomatology, some risk factors present, and identifiable protective factors, including available and accessible social support.  PLAN OF CARE: hospital admission, medication management, substance abuse counseling, discharge planning.  Mr. Lazare is a 53 year old male with a history of depression and alcoholism admitted for suicidal threats with a plan to drive his car into traffic in the contest of medication noncompliance and drinking.  #Suicidal ideation -patient able to contract for safety in the hospital  #Mood/psychosis -restart Prozac 40 mg daily -start Seroquel 50 mg nightly -Trazodone 100  mg nightly  #Alcohol detox -denies extensive drinking -discontinue CIWA protocol -Low Librium taper -Neurontin 400 mg TID -Protonix 40 mg daily  #Substance abuse  treatment -desired rehab but claims to relapse on alcohol, after 2 months pf sobriety, on the day of admission  #Hypothyroidism -Synthroid 125 mcg daily  #Labs -lipid panel, TSH, A1C -EKG  #Disposition -discharge to home with family -follow up with [employer] in network provider    I certify that inpatient services furnished can reasonably be expected to improve the patient's condition.   Orson Slick, MD 08/29/2018, 1:29 PM

## 2018-08-28 NOTE — BH Assessment (Signed)
Assessment Note  Ivan Hamilton is an 53 y.o. male who presents to ED under IVC due to altered mental status. Pt was transported to the ED by his daughter Ivan Hamilton: [phone]). Pt reports he is unable to recall the events that happened prior to his arrival to the ED. Per pt report, he had 4-5 glasses of tequila. Pt states his family left him home alone and he started to experience increased anxiety and decided to have a glass of alcohol - he can't remember anything after he consumed the first glass of tequila. Pt reported that he vaguely remembers seeing his deceased friend who died while in combat with pt in [place] - "I lost my friend in [place] and I was talking to him". 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 Pt denied suicidal ideation to this Probation officer; however, pt's daughter reports pt was saying he wanted to kill himself before coming to ED. Pt is currently prescribed Buspar and Hydroxyzine (per pt report) by the [facility] (last seen 2 years ago). Pt reports med compliance with his current prescriptions. He states he has not been able to follow-up with this provider due to an unpaid medical bill. Pt denied current SI/AVH. He also denied homicidal ideations.   Per pt's daughter, "For the past month he has been drinking and taking his antidepressant. We noticed his attitude had become extremely depressed and he wouldn't get out of the bed, seeing things, and wanting to kill himself". Daughter called mobile crisis and was recommended to bring pt to the ED to be evaluated. Pt's daughter further reports that pt scratched a "hole" in his head "to get the voices out of his head". She reports pt has experienced similar episodes intermittently for the last 3 years.     This Probation officer did not observe pt responding to internal stimuli during assessment. Pt was alert and oriented x4 while pleasant in demeanor.    Diagnosis:   Major Depressive Disorder with psychotic features Alcohol Use Disorder, Severe  Past Medical History:  Past Medical History:  Diagnosis Date  . Alcohol withdrawal ([place])   . B12 deficiency   . Chronic pain   . CVA (cerebral vascular accident) ([place])   . Hypertension   . Hypothyroidism   . Varicose veins     Past Surgical History:  Procedure Laterality Date  . CHOLECYSTECTOMY    . ESOPHAGOGASTRODUODENOSCOPY (EGD) WITH PROPOFOL N/A 10/24/2017   Procedure: ESOPHAGOGASTRODUODENOSCOPY (EGD) WITH PROPOFOL;  Surgeon: [name], MD;  Location: [facility] ENDOSCOPY;  Service: Endoscopy;  Laterality: N/A;  . LEG SURGERY      Family History: No family history on file.  Social History:  reports that he has never smoked. He has never used smokeless tobacco. He reports current alcohol use. He reports that he does not use drugs.  Additional Social History:  Alcohol / Drug Use Pain Medications: See MAR Prescriptions: See MAR Over the Counter: See PTA History of alcohol / drug use?: Yes Longest period of sobriety (when/how long): "2 months ago" Negative Consequences of Use: Personal relationships, Financial Withdrawal Symptoms: (None identified at this time) Substance #1 Name of Substance 1: Alcohol 1 - Age of First Use: Pt Unable to Recall 1 - Amount (size/oz): Unable to Quantify 1 - Frequency: Unable to Quantify 1 - Duration: Unable to Quantify 1 - Last Use / Amount: 08/27/2018  CIWA: CIWA-Ar BP: 106/67 Pulse Rate: 64 Nausea and Vomiting:  no nausea and no vomiting Tactile Disturbances: none Tremor: no tremor Auditory Disturbances: not present Paroxysmal Sweats: no sweat visible Visual Disturbances: not present Anxiety: no anxiety, at ease Headache, Fullness in Head: none present Agitation: normal activity Orientation and Clouding of Sensorium: oriented and can do serial additions CIWA-Ar Total: 0 COWS:    Allergies: No Known Allergies  Home Medications: (Not in a hospital  admission)   OB/GYN Status:  No LMP for male patient.  General Assessment Data Assessment unable to be completed: Yes Reason for not completing assessment: Pt refused assessment. Pt ststaes "I don't know why I'm here and I just want to go home".  Location of Assessment: [facility] ED TTS Assessment: In system Is this a Tele or Face-to-Face Assessment?: Face-to-Face Is this an Initial Assessment or a Re-assessment for this encounter?: Initial Assessment Patient Accompanied by:: N/A Language Other than English: No Living Arrangements: Other (Comment)(Private Living) What gender do you identify as?: Male Marital status: Divorced [place] name: n/a Pregnancy Status: No Living Arrangements: Children, Spouse/significant other Can pt return to current living arrangement?: Yes Admission Status: Involuntary Petitioner: Family member Is patient capable of signing voluntary admission?: No Referral Source: Self/Family/Friend Insurance type: [employer] Medicare  Medical Screening Exam ([place]) Medical Exam completed: Yes  Crisis Care Plan Living Arrangements: Children, Spouse/significant other Legal Guardian: Other:(Self) Name of Psychiatrist: [facility] Name of Therapist: None Reported  Education Status Is patient currently in school?: No Is the patient employed, unemployed or receiving disability?: Receiving disability income  Risk to self with the past 6 months Suicidal Ideation: No Has patient been a risk to self within the past 6 months prior to admission? : Yes Suicidal Intent: No-Not Currently/Within Last 6 Months Has patient had any suicidal intent within the past 6 months prior to admission? : Yes Is patient at risk for suicide?: No Suicidal Plan?: No-Not Currently/Within Last 6 Months Has patient had any suicidal plan within the past 6 months prior to admission? : Yes Access to Means: No What has been your use of drugs/alcohol within the last 12 months?:  Alcohol Previous Attempts/Gestures: Yes How many times?: 1 Other Self Harm Risks: Cutting behaviors in the past per pt report Triggers for Past Attempts: Hallucinations, Other (Comment)(PTSD) Intentional Self Injurious Behavior: Cutting Comment - Self Injurious Behavior: Hx of cutting arms in the past Family Suicide History: No Recent stressful life event(s): Financial Problems Persecutory voices/beliefs?: No Depression: Yes Depression Symptoms: Despondent, Insomnia, Tearfulness, Isolating, Fatigue, Guilt, Feeling worthless/self pity Substance abuse history and/or treatment for substance abuse?: Yes Suicide prevention information given to non-admitted patients: Not applicable  Risk to Others within the past 6 months Homicidal Ideation: No Does patient have any lifetime risk of violence toward others beyond the six months prior to admission? : No Thoughts of Harm to Others: No Current Homicidal Intent: No Current Homicidal Plan: No Access to Homicidal Means: No Identified Victim: None Identified History of harm to others?: No Assessment of Violence: None Noted Violent Behavior Description: None Reported Does patient have access to weapons?: No Criminal Charges Pending?: No Does patient have a court date: No Is patient on probation?: No  Psychosis Hallucinations: Auditory Delusions: None noted  Mental Status Report Appearance/Hygiene: In scrubs Eye Contact: Good Motor Activity: Freedom of movement Speech: Logical/coherent Level of Consciousness: Alert Mood: Pleasant Affect: Appropriate to circumstance Anxiety Level: Minimal Thought Processes: Coherent, Relevant Judgement: Unimpaired Orientation: Person, Place, Time, Situation, Appropriate for developmental age Obsessive Compulsive Thoughts/Behaviors: None  Cognitive Functioning Concentration:  Normal Memory: Recent Intact, Remote Intact Is patient IDD: No Insight: Good Impulse Control: Fair Appetite: Good Have you  had any weight changes? : No Change Sleep: No Change Total Hours of Sleep: 8 Vegetative Symptoms: None  ADLScreening [facility] Assessment Services) Patient's cognitive ability adequate to safely complete daily activities?: Yes Patient able to express need for assistance with ADLs?: Yes Independently performs ADLs?: Yes (appropriate for developmental age)  Prior Inpatient Therapy Prior Inpatient Therapy: Yes Prior Therapy Dates: 2019(Past ED visits) Prior Therapy Facilty/Provider(s): [facility] Reason for Treatment: Depression; Alcohol  Prior Outpatient Therapy Prior Outpatient Therapy: No Does patient have an ACCT team?: No Does patient have Intensive In-House Services?  : No Does patient have [facility] services? : No Does patient have P4CC services?: No  ADL Screening (condition at time of admission) Patient's cognitive ability adequate to safely complete daily activities?: Yes Patient able to express need for assistance with ADLs?: Yes Independently performs ADLs?: Yes (appropriate for developmental age)       Abuse/Neglect Assessment (Assessment to be complete while patient is alone) Abuse/Neglect Assessment Can Be Completed: Yes Physical Abuse: Denies Verbal Abuse: Denies Sexual Abuse: Denies Exploitation of patient/patient's resources: Denies Self-Neglect: Denies Values / Beliefs Cultural Requests During Hospitalization: None Spiritual Requests During Hospitalization: None Consults Spiritual Care Consult Needed: No Social Work Consult Needed: No Regulatory affairs officer (For Healthcare) Does Patient Have a Medical Advance Directive?: No Would patient like information on creating a medical advance directive?: No - Patient declined       Child/Adolescent Assessment Running Away Risk: (Patient is an adult)  Disposition:  Disposition Initial Assessment Completed for this Encounter: Yes Disposition of Patient: Admit Type of inpatient treatment program: Adult Patient refused  recommended treatment: No Mode of transportation if patient is discharged/movement?: N/A  On Site Evaluation by:   Reviewed with Physician:    Frederich Cha 08/28/2018 12:14 PM

## 2018-08-28 NOTE — Progress Notes (Signed)
Patient was able to be complaint with EKG testing with copy placed on chart for MD review.

## 2018-08-28 NOTE — ED Provider Notes (Signed)
-----------------------------------------   6:49 AM on 08/28/2018 -----------------------------------------   Blood pressure 106/67, pulse 64, temperature 98.1 F (36.7 C), temperature source Oral, resp. rate 18, height 1.6 m (5\' 3" ), weight 63.5 kg, SpO2 96 %.  The patient is calm and cooperative at this time.  There have been no acute events since the last update.  Awaiting disposition plan from Behavioral Medicine team.   Hinda Kehr, MD 08/28/18 [phone]
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
## 2018-08-28 NOTE — ED Notes (Signed)
Pt's breakfast tray placed at bedside.

## 2018-08-28 NOTE — BHH Group Notes (Signed)
Balance In Life 08/28/2018 1PM  Type of Therapy/Topic:  Group Therapy:  Balance in Life  Participation Level:  Did Not Attend  Description of Group:   This group will address the concept of balance and how it feels and looks when one is unbalanced. Patients will be encouraged to process areas in their lives that are out of balance and identify reasons for remaining unbalanced. Facilitators will guide patients in utilizing problem-solving interventions to address and correct the stressor making their life unbalanced. Understanding and applying boundaries will be explored and addressed for obtaining and maintaining a balanced life. Patients will be encouraged to explore ways to assertively make their unbalanced needs known to significant others in their lives, using other group members and facilitator for support and feedback.  Therapeutic Goals: 1. Patient will identify two or more emotions or situations they have that consume much of in their lives. 2. Patient will identify signs/triggers that life has become out of balance:  3. Patient will identify two ways to set boundaries in order to achieve balance in their lives:  4. Patient will demonstrate ability to communicate their needs through discussion and/or role plays  Summary of Patient Progress:    Therapeutic Modalities:   Cognitive Behavioral Therapy Solution-Focused Therapy Assertiveness Training  Gratia Disla T Lebaron Bautch, LCSW  

## 2018-08-28 NOTE — ED Notes (Signed)
Pt given lunch tray.  Performed hand hygiene.
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
## 2018-08-28 NOTE — ED Notes (Signed)
Shawna Clamp patient's daughter called to check on patient. She was told that patient is sleeping and calm.  Daughter wanted to be called if patient is transferred and for updates. [phone].

## 2018-08-28 NOTE — BH Assessment (Signed)
Patient is to be admitted to [facility] by Dr. Leverne Humbles.  Attending Physician will be Dr. Bary Leriche.   Patient has been assigned to room 304, by [place] Nurse Jeddito.   Intake Paper Work has been signed and placed on patient chart.  ER staff is aware of the admission:   Dr. Lucita Lora, ER MD   Berton Lan, Patient's Nurse   Elberta Fortis, Patient Access.

## 2018-08-28 NOTE — Tx Team (Signed)
Initial Treatment Plan 08/28/2018 5:14 PM [name]:[id number]    PATIENT STRESSORS: Occupational concerns Substance abuse Traumatic event (PTSD--Military related)   PATIENT STRENGTHS: Ability for insight Average or above average intelligence Capable of independent living Warehouse manager means Physical Health Supportive family/friends Work skills   PATIENT IDENTIFIED PROBLEMS: Alteration in mood (Depression & Anxiety) "I've been feeling sad, thinking of everything that happened to me in the [place] lately, so I started drinking".  Substance abuse "I've been drinking too much and my daughter told me to stop".  Risk for self harm (PTA--Pt took unknown pills and drank some tequila to end his life".                 DISCHARGE CRITERIA:  Improved stabilization in mood, thinking, and/or behavior Verbal commitment to aftercare and medication compliance Withdrawal symptoms are absent or subacute and managed without 24-hour nursing intervention  PRELIMINARY DISCHARGE PLAN: Outpatient therapy Return to previous living arrangement Return to previous work or school arrangements  PATIENT/FAMILY INVOLVEMENT: This treatment plan has been presented to and reviewed with the patient, Ivan Hamilton.  The patient and family have been given the opportunity to ask questions and make suggestions.  Keane Police, RN 08/28/2018, 5:14 PM

## 2018-08-29 DIAGNOSIS — F333 Major depressive disorder, recurrent, severe with psychotic symptoms: Principal | ICD-10-CM

## 2018-08-29 LAB — TSH: TSH: 0.133 u[IU]/mL — ABNORMAL LOW (ref 0.350–4.500)

## 2018-08-29 LAB — LIPID PANEL
Cholesterol: 179 mg/dL (ref 0–200)
HDL: 68 mg/dL (ref >40)
LDL Cholesterol: 96 mg/dL (ref 0–99)
Total CHOL/HDL Ratio: 2.6 RATIO
Triglycerides: 75 mg/dL (ref <150)
VLDL: 15 mg/dL (ref 0–40)

## 2018-08-29 LAB — HEMOGLOBIN A1C
Hgb A1c MFr Bld: 5.7 % — ABNORMAL HIGH (ref 4.8–5.6)
Mean Plasma Glucose: 116.89 mg/dL

## 2018-08-29 MED ORDER — CHLORDIAZEPOXIDE HCL 10 MG PO CAPS
10.0000 mg | ORAL_CAPSULE | Freq: Four times a day (QID) | ORAL | Status: DC
  Administered 2018-08-29 – 2018-08-30 (×4): 10 mg via ORAL
  Filled 2018-08-29 (×4): qty 1

## 2018-08-29 MED ORDER — QUETIAPINE FUMARATE 100 MG PO TABS
100.0000 mg | ORAL_TABLET | Freq: Every day | ORAL | Status: DC
  Administered 2018-08-29: 100 mg via ORAL
  Filled 2018-08-29: qty 1

## 2018-08-29 NOTE — Progress Notes (Signed)
Recreation Therapy Notes  Date: 08/29/2018  Time: 9:30 am   Location: Craft room   Behavioral response: N/A   Intervention Topic: Leisure  Discussion/Intervention: Patient did not attend group.   Clinical Observations/Feedback:  Patient did not attend group.   Recardo Linn LRT/CTRS        Markeda Narvaez 08/29/2018 10:19 AM

## 2018-08-29 NOTE — Plan of Care (Signed)
Problem: Education: Goal: Knowledge of [place] [facility] Education information/materials will improve Outcome: Progressing   Problem: Health Behavior/Discharge Planning: Goal: Compliance with treatment plan for underlying cause of condition will improve Outcome: Progressing   Problem: Safety: Goal: Periods of time without injury will increase Outcome: Progressing   Problem: Medication: Goal: Compliance with prescribed medication regimen will improve Outcome: Progressing DAR Note: Pt A & O X4. Presents with sullen affect on initial approach but brightens up and forwards during interactions. Reports concern about getting his etoh use and depression under control "I'm not going to tell you that I will not drink any more but I know I must stop it to save my life". Rates his depression 6/10 and anxiety 4/10.  Cooperative with care and unit routines. Denies SI, HI, AVH and pain at this time. Visible in milieu for majority of this shift and interacts well with others. Pt attended noon groups and was engaged. Compliant with medications as ordered. Denies adverse drug reactions when assessed.  Emotional support and reassurance offered to pt throughout this shift. Pt encouraged to comply with current treatment regimen including groups. All medications given per MD's orders with verbal education and effects monitored.  Q15 minutes safety checks maintained without incident to note at this time.  Pt receptive to care. Tolerates all PO intake well. POC maintained for safety and mood stability.

## 2018-08-29 NOTE — Plan of Care (Signed)
Pt. Endorses a normal mood. Pt. Is complaint with medications. Pt. Denies si/hi/avh, can contract for safety. Pt. Monitored per MD orders for safety.    Problem: Education: Goal: Emotional status will improve Outcome: Progressing Goal: Mental status will improve Outcome: Progressing   Problem: Health Behavior/Discharge Planning: Goal: Compliance with treatment plan for underlying cause of condition will improve Outcome: Progressing   Problem: Safety: Goal: Periods of time without injury will increase Outcome: Progressing   Problem: Safety: Goal: Ability to remain free from injury will improve Outcome: Progressing

## 2018-08-29 NOTE — Progress Notes (Signed)
Pastoral Care Visit    08/29/18 1545  Clinical Encounter Type  Visited With Patient  Visit Type Initial;Psychological support;Social support;Spiritual support  Referral From Patient  Consult/Referral To Chaplain   Pt wanted to talk w/ chap about spiritual questions about God and faith.  Pt was very engaged and pleasant to talk with.  Pt identifies as not Catholic or Protestant (well versed and experienced in both) but rather just a Christian.    Darcey Nora, Chaplain

## 2018-08-29 NOTE — Tx Team (Signed)
Interdisciplinary Treatment and Diagnostic Plan Update  08/29/2018 Time of Session: 1030am Ivan Hamilton MRN: [id number]  Principal Diagnosis: Severe recurrent major depressive disorder with psychotic features [facility])  Secondary Diagnoses: Principal Problem:   Severe recurrent major depressive disorder with psychotic features ([place]) Active Problems:   Hypothyroid   Alcohol use disorder, severe, dependence ([place])   Alcohol intoxication ([place])   Suicidal ideation   Current Medications:  Current Facility-Administered Medications  Medication Dose Route Frequency Provider Last Rate Last Dose  . acetaminophen (TYLENOL) tablet 650 mg  650 mg Oral Q6H PRN Lavella Hammock, MD      . alum & mag hydroxide-simeth (MAALOX/MYLANTA) 200-200-20 MG/5ML suspension 30 mL  30 mL Oral Q4H PRN Lavella Hammock, MD      . aspirin chewable tablet 81 mg  81 mg Oral Daily Lamont Dowdy, NP   81 mg at 08/29/18 0746  . chlordiazePOXIDE (LIBRIUM) capsule 10 mg  10 mg Oral QID Pucilowska, Jolanta B, MD   10 mg at 08/29/18 1205  . FLUoxetine (PROZAC) capsule 20 mg  20 mg Oral Daily Lamont Dowdy, NP   20 mg at 08/29/18 0746  . gabapentin (NEURONTIN) capsule 400 mg  400 mg Oral TID Lamont Dowdy, NP   400 mg at 08/29/18 1205  . hydrOXYzine (ATARAX/VISTARIL) tablet 50 mg  50 mg Oral TID PRN Lamont Dowdy, NP      . levothyroxine (SYNTHROID, LEVOTHROID) tablet 125 mcg  125 mcg Oral QAC breakfast Lamont Dowdy, NP   125 mcg at 08/29/18 0746  . magnesium hydroxide (MILK OF MAGNESIA) suspension 30 mL  30 mL Oral Daily PRN Lavella Hammock, MD      . pantoprazole (PROTONIX) EC tablet 40 mg  40 mg Oral Daily Lamont Dowdy, NP   40 mg at 08/29/18 0746  . QUEtiapine (SEROQUEL) tablet 100 mg  100 mg Oral QHS Pucilowska, Jolanta B, MD      . thiamine (VITAMIN B-1) tablet 100 mg  100 mg Oral Daily Lamont Dowdy, NP   100 mg at 08/29/18 6659   Or  . thiamine (B-1) injection 100 mg   100 mg Intravenous Daily Lamont Dowdy, NP      . traZODone (DESYREL) tablet 100 mg  100 mg Oral QHS Lamont Dowdy, NP   100 mg at 08/28/18 2112  . traZODone (DESYREL) tablet 50 mg  50 mg Oral QHS PRN,MR X 1 Lavella Hammock, MD       PTA Medications: Medications Prior to Admission  Medication Sig Dispense Refill Last Dose  . aspirin 81 MG chewable tablet Chew 1 tablet (81 mg total) by mouth daily. (Patient not taking: Reported on 12/30/2017) 30 tablet 1 Not Taking at Unknown time  . busPIRone (BUSPAR) 15 MG tablet Take 15 mg by mouth 2 (two) times daily.   08/27/2018 at 0700  . citalopram (CELEXA) 20 MG tablet Take 1 tablet (20 mg total) by mouth daily for 20 days. 20 tablet 0   . feeding supplement, ENSURE ENLIVE, (ENSURE ENLIVE) LIQD Take 237 mLs by mouth 2 (two) times daily between meals. (Patient not taking: Reported on 12/30/2017) 237 mL 12 Not Taking  . FLUoxetine (PROZAC) 20 MG tablet Take 20 mg by mouth daily.    08/26/2018 at Unknown time  . gabapentin (NEURONTIN) 400 MG capsule Take 400 mg by mouth 3 (three) times daily.    08/27/2018 at 1200  . hydrOXYzine (ATARAX/VISTARIL) 50 MG tablet Take 50 mg by mouth 3 (three) times daily  as needed.    PRN at PRN  . IRON PO Take by mouth.   Not Taking at Unknown time  . levothyroxine (SYNTHROID, LEVOTHROID) 125 MCG tablet Take 125 mcg by mouth daily before breakfast.    08/27/2018 at 0700  . omeprazole (PRILOSEC) 20 MG capsule Take 20 mg by mouth daily.    08/26/2018 at Unknown time  . oxyCODONE-acetaminophen (PERCOCET/ROXICET) 5-325 MG tablet Take 1 tablet by mouth every 6 (six) hours as needed. for pain  0 Not Taking at Unknown time  . pantoprazole (PROTONIX) 40 MG tablet Take 1 tablet (40 mg total) by mouth 2 (two) times daily before a meal. 60 tablet 1   . QUEtiapine (SEROQUEL) 50 MG tablet Take 1 tablet (50 mg total) by mouth at bedtime. 30 tablet 0   . traZODone (DESYREL) 50 MG tablet Take 100 mg by mouth at bedtime.   08/27/2018 at 1100     Patient Stressors: Occupational concerns Substance abuse Traumatic event  Patient Strengths: Ability for insight Average or above average intelligence Capable of independent living Child psychotherapist Physical Health Supportive family/friends Work skills  Treatment Modalities: Medication Management, Group therapy, Case management,  1 to 1 session with clinician, Psychoeducation, Recreational therapy.   Physician Treatment Plan for Primary Diagnosis: Severe recurrent major depressive disorder with psychotic features ([place]) Long Term Goal(s): Improvement in symptoms so as ready for discharge Improvement in symptoms so as ready for discharge   Short Term Goals: Ability to identify changes in lifestyle to reduce recurrence of condition will improve Ability to verbalize feelings will improve Ability to disclose and discuss suicidal ideas Ability to demonstrate self-control will improve Ability to identify and develop effective coping behaviors will improve Ability to maintain clinical measurements within normal limits will improve Compliance with prescribed medications will improve Ability to identify triggers associated with substance abuse/mental health issues will improve Ability to demonstrate self-control will improve Ability to identify triggers associated with substance abuse/mental health issues will improve  Medication Management: Evaluate patient's response, side effects, and tolerance of medication regimen.  Therapeutic Interventions: 1 to 1 sessions, Unit Group sessions and Medication administration.  Evaluation of Outcomes: Progressing  Physician Treatment Plan for Secondary Diagnosis: Principal Problem:   Severe recurrent major depressive disorder with psychotic features ([place]) Active Problems:   Hypothyroid   Alcohol use disorder, severe, dependence ([place])   Alcohol intoxication ([place])   Suicidal ideation  Long Term Goal(s): Improvement in  symptoms so as ready for discharge Improvement in symptoms so as ready for discharge   Short Term Goals: Ability to identify changes in lifestyle to reduce recurrence of condition will improve Ability to verbalize feelings will improve Ability to disclose and discuss suicidal ideas Ability to demonstrate self-control will improve Ability to identify and develop effective coping behaviors will improve Ability to maintain clinical measurements within normal limits will improve Compliance with prescribed medications will improve Ability to identify triggers associated with substance abuse/mental health issues will improve Ability to demonstrate self-control will improve Ability to identify triggers associated with substance abuse/mental health issues will improve     Medication Management: Evaluate patient's response, side effects, and tolerance of medication regimen.  Therapeutic Interventions: 1 to 1 sessions, Unit Group sessions and Medication administration.  Evaluation of Outcomes: Progressing   RN Treatment Plan for Primary Diagnosis: Severe recurrent major depressive disorder with psychotic features ([place]) Long Term Goal(s): Knowledge of disease and therapeutic regimen to maintain health will improve  Short Term Goals: Ability to  participate in decision making will improve, Ability to verbalize feelings will improve, Ability to disclose and discuss suicidal ideas, Ability to identify and develop effective coping behaviors will improve and Compliance with prescribed medications will improve  Medication Management: RN will administer medications as ordered by provider, will assess and evaluate patient's response and provide education to patient for prescribed medication. RN will report any adverse and/or side effects to prescribing provider.  Therapeutic Interventions: 1 on 1 counseling sessions, Psychoeducation, Medication administration, Evaluate responses to treatment, Monitor vital  signs and CBGs as ordered, Perform/monitor CIWA, COWS, AIMS and Fall Risk screenings as ordered, Perform wound care treatments as ordered.  Evaluation of Outcomes: Progressing   LCSW Treatment Plan for Primary Diagnosis: Severe recurrent major depressive disorder with psychotic features ([place]) Long Term Goal(s): Safe transition to appropriate next level of care at discharge, Engage patient in therapeutic group addressing interpersonal concerns.  Short Term Goals: Engage patient in aftercare planning with referrals and resources  Therapeutic Interventions: Assess for all discharge needs, 1 to 1 time with Social worker, Explore available resources and support systems, Assess for adequacy in community support network, Educate family and significant other(s) on suicide prevention, Complete Psychosocial Assessment, Interpersonal group therapy.  Evaluation of Outcomes: Progressing   Progress in Treatment: Attending groups: Yes. Participating in groups: Yes. Taking medication as prescribed: Yes. Toleration medication: Yes. Family/Significant other contact made: No, will contact:  Roberts Gaudy, wife Patient understands diagnosis: Yes. Discussing patient identified problems/goals with staff: Yes. Medical problems stabilized or resolved: Yes. Denies suicidal/homicidal ideation: Yes. Issues/concerns per patient self-inventory: No. Other: NA  New problem(s) identified: No, Describe:  none reported  New Short Term/Long Term Goal(s): "Go back to when I was sober"  Patient Goals:  "Go back to when I was sober"  Discharge Plan or Barriers: Pt will return home and follow up with outpatient treatment. Pt states he previously attended AA and has been to residential treatment a few times.  Reason for Continuation of Hospitalization: Medication stabilization  Estimated Length of Stay: 5-7 days  Attendees: Patient:Ivan Hamilton 08/29/2018 3:22 PM  Physician: Orson Slick MD 08/29/2018  3:22 PM  Nursing: Earvin Hansen RN 08/29/2018 3:22 PM  RN Care Manager: 08/29/2018 3:22 PM  Social Worker: Sanjuana Kava LCSW Assunta Curtis LCSW 08/29/2018 3:22 PM  Recreational Therapist:  08/29/2018 3:22 PM  Other:  08/29/2018 3:22 PM  Other:  08/29/2018 3:22 PM  Other: 08/29/2018 3:22 PM    Scribe for Treatment Team: Yvette Rack, LCSW 08/29/2018 3:22 PM

## 2018-08-29 NOTE — BHH Group Notes (Signed)
LCSW Group Therapy Note  08/29/2018 1:00 PM  Type of Therapy and Topic:  Group Therapy:  Feelings around Relapse and Recovery  Participation Level:  Active   Description of Group:    Patients in this group will discuss emotions they experience before and after a relapse. They will process how experiencing these feelings, or avoidance of experiencing them, relates to having a relapse. Facilitator will guide patients to explore emotions they have related to recovery. Patients will be encouraged to process which emotions are more powerful. They will be guided to discuss the emotional reaction significant others in their lives may have to their relapse or recovery. Patients will be assisted in exploring ways to respond to the emotions of others without this contributing to a relapse.  Therapeutic Goals: 1. Patient will identify two or more emotions that lead to a relapse for them 2. Patient will identify two emotions that result when they relapse 3. Patient will identify two emotions related to recovery 4. Patient will demonstrate ability to communicate their needs through discussion and/or role plays   Summary of Patient Progress: Patient was an active participant in group.  Patient discussed with group feelings he has around relapse as well as feelings that the family has around their discharge.  Patient discussed with group how triggers have been problematic for him.  Patient also engaged in discussion on how he has worked to regain his family's trust.    Therapeutic Modalities:   Cognitive Behavioral Therapy Solution-Focused Therapy Assertiveness Training Relapse Prevention Therapy   Assunta Curtis, MSW, LCSW 08/29/2018 1:54 PM

## 2018-08-29 NOTE — Progress Notes (Signed)
D: Pt during assessments denies SI/HI/AVH, can contract for safety. Pt is pleasant and cooperative, engages appropriately. Pt. has no complaints for this writer, denies pain. Patient interactions with staff and peers appropriate. Pt. Endorses a normal mood and denies anxiety and/or depression. Pt. Affect appropriate.    A: Q x 15 minute observation checks were completed for safety. Patient was provided with education, but will need reinforcement. Patient was given/offered medications per orders. Patient  was encourage to attend groups, participate in unit activities and continue with plan of care. Pt. Chart and plans of care reviewed. Pt. Given support and encouragement.   R: Patient is complaint with medications and unit procedures. Pt. ekg completed with results placed in the chart for MD review. Pt. Monitored per CIWA.             Precautionary checks every 15 minutes for safety maintained, room free of safety hazards, patient sustains no injury or falls during this shift. Will endorse care to next shift.

## 2018-08-29 NOTE — BHH Counselor (Signed)
Adult Comprehensive Assessment  Patient ID: Ivan Hamilton, male   DOB: Nov 28, 1965, 53 y.o.   MRN: [id number]   Information Source: Information source: Patient   Current Stressors:  Educational / Learning stressors: None reported.  Employment / Job issues: Pt says he recently quit his job.  Family Relationships: Pt reports having a good relationship with ex-wife.  Financial / Lack of resources (include bankruptcy): Limited income.  Housing / Lack of housing: Stable housing  Physical health (include injuries & life threatening diseases): Pt reports having back and leg problems. Social relationships: None reported  Substance abuse: Pt reports hx of alcoholism, stating recently he had been sober for two months before he began drinking again.   Living/Environment/Situation:  Living Arrangements: Ex-wife, daughters Living conditions (as described by patient or guardian): Pt lives with ex-wife and children. He says they all take turns/shifts caring for him.  How long has patient lived in current situation?: 13 years  What is atmosphere in current home: Supportive, Loving   Family History:  Marital status: Divorced Number of Years Married: 45 What types of issues is patient dealing with in the relationship?: Pt's drinking had caused problems within the marriage  Are you sexually active?: None reported What is your sexual orientation?: Heterosexual  Has your sexual activity been affected by drugs, alcohol, medication, or emotional stress?: None reported Does patient have children?: Yes How many children?: 2 How is patient's relationship with their children?: 2 daughters, ages 69 and 64; close relationship    Childhood History:  By whom was/is the patient raised?: Both parents Description of patient's relationship with caregiver when they were a child: Good relationship.  Patient's description of current relationship with people who raised him/her: Mother lives in [place], father  passed away a few years ago.  How were you disciplined when you got in trouble as a child/adolescent?: None reported  Does patient have siblings?: Yes Number of Siblings: 8 Description of patient's current relationship with siblings: 5 brothers, 3 sisters; "ok" relationships.  Did patient suffer any verbal/emotional/physical/sexual abuse as a child?: No Did patient suffer from severe childhood neglect?: No Has patient ever been sexually abused/assaulted/raped as an adolescent or adult?: No Was the patient ever a victim of a crime or a disaster?: No Witnessed domestic violence?: No Has patient been effected by domestic violence as an adult?: No   Education:  Highest grade of school patient has completed: Pt reports he graduated high school in [place].   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   Financial Resources:   Financial resources: Disability Does patient have a Programmer, applications or guardian?: No   Alcohol/Substance Abuse:   What has been your use of drugs/alcohol within the last 12 months?: Pt reports drinking half a bottle of Tequila, says he was sober for two months. Pt reports loss of sobriety triggered by flashbacks of soldiers killed while in combat. Alcohol/Substance Abuse Treatment Hx: Past Tx, Inpatient If yes, describe treatment: [facility] 2016 Has alcohol/substance abuse ever caused legal problems?: No   Social Support System:   Patient's Community Support System: Good Describe Community Support System: family  Type  of faith/religion: NA How does patient's faith help to cope with current illness?: NA     Leisure/Recreation:   Leisure and Hobbies: Pt reports he works all the time.    Strengths/Needs:   What things does the patient do well?: Pt reports he enjoys working and likes to stay busy.  In what areas does patient struggle / problems for patient: unemployment, alcohol abuse.    Discharge Plan:   Does patient have access to transportation?: Yes Will patient be returning to same living situation after discharge?: Yes Currently receiving community mental health services: No, pt reports he was previously seen at [facility] but stopped going when he started to feel better. Unable to recall name of providers. Does patient have financial barriers related to discharge medications?: No    Summary/Recommendations:    Patient is a 53 year old Hispanic male brought to the ED with a hx of alcoholism and major depressive disorder with psychotic features. The pt is from [place] and served in the [employer] for 11 yrs while living there. Pt reports moving to the [place] in 1989; lived in [place] for 61yrs. Pt discussed having PTSD and experiencing flashbacks of soldiers killed during combat. While in the [employer], the pt states he had one year of individual therapy. Pt was previously seen at [facility] but says he stopped going when he started making progress. Pt says he is open to receiving opt and med mgmt. and prefers a Spanish speaking provider. Pt has a strong support system consisting of his ex-wife and children. Patient will benefit from crisis stabilization, medication evaluation, group therapy and psycho education in addition to case management for discharge. At discharge, it is recommended that patient remain compliant with established discharge plan and continued treatment. Birl Lobello Lynelle Smoke. 08/29/2018

## 2018-08-30 DIAGNOSIS — G609 Hereditary and idiopathic neuropathy, unspecified: Secondary | ICD-10-CM

## 2018-08-30 DIAGNOSIS — F4310 Post-traumatic stress disorder, unspecified: Secondary | ICD-10-CM

## 2018-08-30 DIAGNOSIS — R1011 Right upper quadrant pain: Secondary | ICD-10-CM

## 2018-08-30 DIAGNOSIS — E039 Hypothyroidism, unspecified: Secondary | ICD-10-CM | POA: Diagnosis not present

## 2018-08-30 DIAGNOSIS — F515 Nightmare disorder: Secondary | ICD-10-CM

## 2018-08-30 DIAGNOSIS — F333 Major depressive disorder, recurrent, severe with psychotic symptoms: Secondary | ICD-10-CM

## 2018-08-30 DIAGNOSIS — R45851 Suicidal ideations: Secondary | ICD-10-CM | POA: Diagnosis not present

## 2018-08-30 DIAGNOSIS — F1020 Alcohol dependence, uncomplicated: Secondary | ICD-10-CM

## 2018-08-30 LAB — T4, FREE: Free T4: 1 ng/dL (ref 0.82–1.77)

## 2018-08-30 MED ORDER — QUETIAPINE FUMARATE ER 200 MG PO TB24
200.0000 mg | ORAL_TABLET | Freq: Every day | ORAL | Status: DC
  Administered 2018-08-30 – 2018-08-31 (×2): 200 mg via ORAL
  Filled 2018-08-30 (×2): qty 1

## 2018-08-30 MED ORDER — CHLORDIAZEPOXIDE HCL 5 MG PO CAPS: 5.0000 mg | ORAL_CAPSULE | Freq: Three times a day (TID) | ORAL | Status: DC | PRN

## 2018-08-30 MED ORDER — FLUOXETINE HCL 20 MG PO CAPS
40.0000 mg | ORAL_CAPSULE | Freq: Every day | ORAL | Status: DC
  Administered 2018-08-31 – 2018-09-01 (×2): 40 mg via ORAL
  Filled 2018-08-30 (×2): qty 2

## 2018-08-30 MED ORDER — PRAZOSIN HCL 1 MG PO CAPS
1.0000 mg | ORAL_CAPSULE | Freq: Every day | ORAL | Status: DC
  Administered 2018-08-31: 1 mg via ORAL
  Filled 2018-08-30 (×2): qty 1

## 2018-08-30 MED ORDER — IBUPROFEN 200 MG PO TABS
400.0000 mg | ORAL_TABLET | ORAL | Status: DC | PRN
  Administered 2018-08-30 – 2018-09-01 (×4): 400 mg via ORAL
  Filled 2018-08-30 (×4): qty 2

## 2018-08-30 MED ORDER — GABAPENTIN 300 MG PO CAPS
600.0000 mg | ORAL_CAPSULE | Freq: Three times a day (TID) | ORAL | Status: DC
  Administered 2018-08-30 – 2018-09-01 (×8): 600 mg via ORAL
  Filled 2018-08-30 (×8): qty 2

## 2018-08-30 MED ORDER — PROPRANOLOL HCL 20 MG PO TABS
10.0000 mg | ORAL_TABLET | Freq: Three times a day (TID) | ORAL | Status: DC | PRN
  Administered 2018-08-30: 10 mg via ORAL
  Filled 2018-08-30: qty 1

## 2018-08-30 MED ORDER — GABAPENTIN 300 MG PO CAPS
600.0000 mg | ORAL_CAPSULE | Freq: Every day | ORAL | Status: DC | PRN
  Administered 2018-08-30 – 2018-08-31 (×2): 600 mg via ORAL
  Filled 2018-08-30 (×2): qty 2

## 2018-08-30 MED ORDER — FLUOXETINE HCL 20 MG PO CAPS
20.0000 mg | ORAL_CAPSULE | Freq: Every day | ORAL | Status: AC
  Administered 2018-08-30: 20 mg via ORAL
  Filled 2018-08-30: qty 1

## 2018-08-30 MED ORDER — LORAZEPAM 1 MG PO TABS
1.0000 mg | ORAL_TABLET | Freq: Once | ORAL | Status: AC
  Administered 2018-08-30: 1 mg via ORAL
  Filled 2018-08-30: qty 1

## 2018-08-30 NOTE — Plan of Care (Signed)
Patient stated that he had a good day and that he is starting to feel better. Patient was compliant with medication administration per MD orders.   Problem: Education: Goal: Emotional status will improve Outcome: Progressing   Problem: Medication: Goal: Compliance with prescribed medication regimen will improve Outcome: Progressing

## 2018-08-30 NOTE — BHH Group Notes (Signed)
LCSW Group Therapy Note  08/30/2018 1:15pm  Type of Therapy and Topic: Group Therapy: Holding on to Grudges   Participation Level: Did Not Attend   Description of Group:  In this group patients will be asked to explore and define a grudge. Patients will be guided to discuss their thoughts, feelings, and reasons as to why people have grudges. Patients will process the impact grudges have on daily life and identify thoughts and feelings related to holding grudges. Facilitator will challenge patients to identify ways to let go of grudges and the benefits this provides. Patients will be confronted to address why one struggles letting go of grudges. Lastly, patients will identify feelings and thoughts related to what life would look like without grudges. This group will be process-oriented, with patients participating in exploration of their own experiences, giving and receiving support, and processing challenge from other group members.  Therapeutic Goals:  1. Patient will identify specific grudges related to their personal life.  2. Patient will identify feelings, thoughts, and beliefs around grudges.  3. Patient will identify how one releases grudges appropriately.  4. Patient will identify situations where they could have let go of the grudge, but instead chose to hold on.   Summary of Patient Progress: Pt was invited to attend group but chose not to attend. CSW will continue to encourage pt to attend group throughout their admission.    Therapeutic Modalities:  Cognitive Behavioral Therapy  Solution Focused Therapy  Motivational Interviewing  Brief Therapy   Raye Wiens  CUEBAS-COLON, LCSW 08/30/2018 9:53 AM

## 2018-08-30 NOTE — Plan of Care (Signed)
Pt. Denies si/hi/avh, can contract for safety. Pt. Reports he can remain safe while on the unit. Pt. Complaint with medications. Pt. Endorses a normal mood. Pt. Reports doing better since his admission regarding his emotional status.   Problem: Education: Goal: Emotional status will improve Outcome: Progressing Goal: Mental status will improve Outcome: Progressing   Problem: Health Behavior/Discharge Planning: Goal: Compliance with treatment plan for underlying cause of condition will improve Outcome: Progressing   Problem: Safety: Goal: Periods of time without injury will increase Outcome: Progressing   Problem: Safety: Goal: Ability to remain free from injury will improve Outcome: Progressing

## 2018-08-30 NOTE — Plan of Care (Signed)
Patient says he is feeling better than he has mentally since he has been here.   Problem: Education: Goal: Emotional status will improve Outcome: Progressing Goal: Mental status will improve Outcome: Progressing

## 2018-08-30 NOTE — Progress Notes (Signed)
MD notified of patient expressed pain and elevated HR. Provider placing orders (see MAR).
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
## 2018-08-30 NOTE — Progress Notes (Signed)
D - Patient was in his room upon arrival to the unit. Patient was pleasant during assessment and medication administration. Patient denies SI/HI/AVH, pain, anxiety and depression with this Probation officer. Patient stated, "I had a good day. I am feeling the best I have since I have been here, mentally.   A - Patient was compliant with medication administration per MD orders and procedures on the unit. Patient was given education. Patient was given support and encouragement to be active in his treatment plan. Patient informed to let staff know if there are any issues or problems.   R - Patient being monitored Q 15 minutes for safety per unit protocol. Patient remains safe on the unit at this time.

## 2018-08-30 NOTE — Progress Notes (Signed)
D - Patient was in his room upon arrival to the unit. Patient was pleasant during assessment and medication administration. Patient denies SI/HI/AVH, pain, anxiety and depression with this Probation officer. Patient stated, "I had a good day. I wanted to lay down and take a nap but I over slept. I am feeling better though, this is the best I have felt in a while."   A - Patient was compliant with medication administration per MD orders and procedures on the unit. Patient was given education. Patient was given support and encouragement to be active in his treatment plan. Patient informed to let staff know if there are any issues or problems.   R - Patient being monitored Q 15 minutes for safety per unit protocol. Patient remains safe on the unit at this time.

## 2018-08-30 NOTE — Progress Notes (Signed)
Ativan 1mg  given for elevated HR. Will monitor VS closely.

## 2018-08-30 NOTE — Progress Notes (Signed)
D: Pt during assessments denies SI/HI/AVH, can contract for safety. Pt is pleasant and cooperative, engages appropriately. Pt. Endorses a normal mood. Pt. Reports doing better overall. Pt. Affect is brighter overall.   A: Q x 15 minute observation checks were completed for safety. Patient was provided with education, needs reinforcement. Patient was given/offered medications per orders. Patient  was encourage to attend groups, participate in unit activities and continue with plan of care. Pt. Chart and plans of care reviewed. Pt. Given support and encouragement.   R: Patient is complaint with medications and unit procedures. Pt. Eating good, attends meals. Pt. This shift reports some pain in his back/abdominal region that is being addressed utilizing MD orders. Pt. EKG completed with copy provided for MD review as well as placed in the chart.             Precautionary checks every 15 minutes for safety maintained, room free of safety hazards, patient sustains no injury or falls during this shift. Will endorse care to next shift.

## 2018-08-31 ENCOUNTER — Other Ambulatory Visit: Payer: Self-pay | Admitting: Psychiatry

## 2018-08-31 DIAGNOSIS — E039 Hypothyroidism, unspecified: Secondary | ICD-10-CM

## 2018-08-31 DIAGNOSIS — F1020 Alcohol dependence, uncomplicated: Secondary | ICD-10-CM

## 2018-08-31 DIAGNOSIS — F4310 Post-traumatic stress disorder, unspecified: Secondary | ICD-10-CM

## 2018-08-31 DIAGNOSIS — F515 Nightmare disorder: Secondary | ICD-10-CM

## 2018-08-31 DIAGNOSIS — R1011 Right upper quadrant pain: Secondary | ICD-10-CM

## 2018-08-31 DIAGNOSIS — F333 Major depressive disorder, recurrent, severe with psychotic symptoms: Secondary | ICD-10-CM

## 2018-08-31 DIAGNOSIS — G609 Hereditary and idiopathic neuropathy, unspecified: Secondary | ICD-10-CM

## 2018-08-31 DIAGNOSIS — R45851 Suicidal ideations: Secondary | ICD-10-CM

## 2018-08-31 MED ORDER — BUSPIRONE HCL 5 MG PO TABS
15.0000 mg | ORAL_TABLET | Freq: Two times a day (BID) | ORAL | Status: DC
  Administered 2018-08-31 – 2018-09-01 (×3): 15 mg via ORAL
  Filled 2018-08-31 (×3): qty 3
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
## 2018-08-31 NOTE — [facility] Group Notes (Signed)
LCSW Group Therapy Note 08/31/2018 1:15pm  Type of Therapy and Topic: Group Therapy: Feelings Around Returning Home & Establishing a Supportive Framework and Supporting Oneself When Supports Not Available  Participation Level: Active  Description of Group:  Patients first processed thoughts and feelings about upcoming discharge. These included fears of upcoming changes, lack of change, new living environments, judgements and expectations from others and overall stigma of mental health issues. The group then discussed the definition of a supportive framework, what that looks and feels like, and how do to discern it from an unhealthy non-supportive network. The group identified different types of supports as well as what to do when your family/friends are less than helpful or unavailable  Therapeutic Goals  1. Patient will identify one healthy supportive network that they can use at discharge. 2. Patient will identify one factor of a supportive framework and how to tell it from an unhealthy network. 3. Patient able to identify one coping skill to use when they do not have positive supports from others. 4. Patient will demonstrate ability to communicate their needs through discussion and/or role plays.  Summary of Patient Progress:  The patient reported he feels "good and positive." Pt engaged during group session. As patients processed their anxiety about discharge and described healthy supports patient shared he is ready to be discharge. He stated, "My mind changed in only a few days." He listed his family as his main support.  Patients identified at least one self-care tool they were willing to use after discharge; staying busy.   Therapeutic Modalities Cognitive Behavioral Therapy Motivational Interviewing   Cheree Ditto, LCSW 08/31/2018 12:38 PM

## 2018-08-31 NOTE — Plan of Care (Signed)
Data: Patient is appropriate and cooperative to assessment. Patient denies SI/HI/AVH. Patient has completed daily self inventory worksheet. Patient reports fair mood. Patient has complaints of mild discomfort to right abdomen but reports a pain rating of 0/10. Patient reports fair sleep quality, appetite is good for the last 24 hours. Patient rates depression "4/10" , feelings of hopelessness "5/10" and anxiety "3/10" Patients goal for today is "be prepre for for that day."  Action:  Q x 15 minute observation checks were completed for safety. Patient was provided with education on medications. Patient was offered support and encouragement. Patient was given scheduled medications. Patient  was encourage to attend groups, participate in unit activities and continue with plan of care.    Response: Patient is adherent with scheduled medications. Patient has no complaints at this time. Patient is receptive to treatment and safety maintained on unit.    Problem: Education: Goal: Knowledge of [facility] Education information/materials will improve Outcome: Progressing Goal: Mental status will improve Outcome: Progressing   Problem: Health Behavior/Discharge Planning: Goal: Compliance with treatment plan for underlying cause of condition will improve Outcome: Progressing   Problem: Safety: Goal: Periods of time without injury will increase Outcome: Progressing

## 2018-08-31 NOTE — Progress Notes (Signed)
Patient had cholecystectomy with refractory pain and liver pain post operative. Spanish speaking preferred or translator requested.

## 2018-09-01 MED ORDER — GABAPENTIN 300 MG PO CAPS: 600.0000 mg | ORAL_CAPSULE | Freq: Three times a day (TID) | ORAL | 1 refills | Status: DC

## 2018-09-01 MED ORDER — FLUOXETINE HCL 40 MG PO CAPS: 40.0000 mg | ORAL_CAPSULE | Freq: Every day | ORAL | 3 refills | Status: DC

## 2018-09-01 MED ORDER — BUSPIRONE HCL 15 MG PO TABS: 15.0000 mg | ORAL_TABLET | Freq: Two times a day (BID) | ORAL | 1 refills | Status: DC

## 2018-09-01 MED ORDER — PRAZOSIN HCL 1 MG PO CAPS: 1.0000 mg | ORAL_CAPSULE | Freq: Every day | ORAL | 1 refills | Status: DC

## 2018-09-01 MED ORDER — QUETIAPINE FUMARATE ER 200 MG PO TB24: 200.0000 mg | ORAL_TABLET | Freq: Every day | ORAL | 1 refills | Status: DC

## 2018-09-01 MED ORDER — LEVOTHYROXINE SODIUM 125 MCG PO TABS: 125.0000 ug | ORAL_TABLET | Freq: Every day | ORAL | 1 refills | Status: DC
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
## 2018-09-01 NOTE — BHH Suicide Risk Assessment (Signed)
[facility] Discharge Suicide Risk Assessment   Principal Problem: Severe recurrent major depressive disorder with psychotic features [facility]) Discharge Diagnoses: Principal Problem:   Severe recurrent major depressive disorder with psychotic features ([place]) Active Problems:   Hypothyroid   Alcohol use disorder, severe, dependence ([place])   Alcohol intoxication ([place])   Suicidal ideation   Total Time spent with patient: 20 minutes  Musculoskeletal: Strength & Muscle Tone: within normal limits Gait & Station: normal Patient leans: N/A  Psychiatric Specialty Exam: Review of Systems  Neurological: Negative.   Psychiatric/Behavioral: Positive for substance abuse.  All other systems reviewed and are negative.   Blood pressure 100/66, pulse 84, temperature 98 F (36.7 C), temperature source Oral, resp. rate 18, height 5\' 3"  (1.6 m), weight 63.5 kg, SpO2 98 %.Body mass index is 24.8 kg/m.  General Appearance: Casual  Eye Contact::  Good  Speech:  Clear and Coherent409  Volume:  Normal  Mood:  Euthymic  Affect:  Appropriate  Thought Process:  Goal Directed and Descriptions of Associations: Intact  Orientation:  Full (Time, Place, and Person)  Thought Content:  WDL  Suicidal Thoughts:  No  Homicidal Thoughts:  No  Memory:  Immediate;   Fair Recent;   Fair Remote;   Fair  Judgement:  Poor  Insight:  Lacking  Psychomotor Activity:  Normal  Concentration:  Fair  Recall:  [place]  Language: Fair  Akathisia:  No  Handed:  Right  AIMS (if indicated):     Assets:  Communication Skills Desire for Improvement Financial Resources/Insurance Housing Resilience Social Support  Sleep:  Number of Hours: 6.3  Cognition: WNL  ADL's:  Intact   Mental Status Per Nursing Assessment::   On Admission:     Demographic Factors:  Male  Loss Factors: Decrease in vocational status, Decline in physical health and Financial problems/change in socioeconomic status  Historical  Factors: Impulsivity  Risk Reduction Factors:   Sense of responsibility to family, Living with another person, especially a relative and Positive social support  Continued Clinical Symptoms:  Bipolar Disorder:   Depressive phase Alcohol/Substance Abuse/Dependencies  Cognitive Features That Contribute To Risk:  None    Suicide Risk:  Minimal: No identifiable suicidal ideation.  Patients presenting with no risk factors but with morbid ruminations; may be classified as minimal risk based on the severity of the depressive symptoms  Follow-up Information    [place] on 09/03/2018.   Why:  Please follow up at [facility] on Wednesday, September 03, 2018 at 930am. Thank you. Contact information: [place] [address] [phone]           Plan Of Care/Follow-up recommendations:  Activity:  as tolerated Diet:  low sodium heart healthy Other:  keep follow up appointments  Orson Slick, MD 09/01/2018, 9:51 AM

## 2018-09-01 NOTE — Progress Notes (Signed)
Patient denies SI/HI, denies A/V hallucinations. Patient verbalizes understanding of discharge instructions, follow up care and prescriptions. Patient given all belongings from [facility] locker. Patient escorted out by staff, transported by family. 

## 2018-09-01 NOTE — Plan of Care (Signed)
Pt. Denies si/hi/avh, can contract for safety. Pt. Is complaint with medications.    Problem: Health Behavior/Discharge Planning: Goal: Compliance with treatment plan for underlying cause of condition will improve Outcome: Progressing   Problem: Safety: Goal: Periods of time without injury will increase Outcome: Progressing

## 2018-09-01 NOTE — BHH Group Notes (Signed)
LCSW Group Therapy Note   09/01/2018 1:51 PM   Type of Therapy and Topic:  Group Therapy:  Overcoming Obstacles   Participation Level:  Active   Description of Group:    In this group patients will be encouraged to explore what they see as obstacles to their own wellness and recovery. They will be guided to discuss their thoughts, feelings, and behaviors related to these obstacles. The group will process together ways to cope with barriers, with attention given to specific choices patients can make. Each patient will be challenged to identify changes they are motivated to make in order to overcome their obstacles. This group will be process-oriented, with patients participating in exploration of their own experiences as well as giving and receiving support and challenge from other group members.   Therapeutic Goals: 1. Patient will identify personal and current obstacles as they relate to admission. 2. Patient will identify barriers that currently interfere with their wellness or overcoming obstacles.  3. Patient will identify feelings, thought process and behaviors related to these barriers. 4. Patient will identify two changes they are willing to make to overcome these obstacles:      Summary of Patient Progress Pt was appropriate and respectful during group. Pt discussed his obstacles and reported that having the courage to change as an obstacle for him and finding it hard to actually change the behaviors that he knows is a problem for him.     Therapeutic Modalities:   Cognitive Behavioral Therapy Solution Focused Therapy Motivational Interviewing Relapse Prevention Therapy  Evalina Field, MSW, LCSW Clinical Social Work 09/01/2018 1:51 PM

## 2018-09-01 NOTE — Progress Notes (Signed)
D: Pt during assessments denies SI/HI/AVH, can contract for safety. Pt is pleasant and cooperative, engages good. Pt. Frequently observed out in the milieu very sociable with peers and staff. Pt. Affect is much brighter and cheerful overall, but during assessments gets a bit anxious. Pt. Endorses a mostly normal mood, but expresses some anxiety.   A: Q x 15 minute observation checks were completed for safety. Patient was provided with education, but could use reinforcement. Patient was given/offered medications per orders. Patient  was encourage to attend groups, participate in unit activities and continue with plan of care. Pt. Chart and plans of care reviewed. Pt. Given support and encouragement.   R: Patient is complaint with medications and unit procedures. Pt. Attends snack time, eating good. Pt. Chronic pain that is being reported is being addressed utilizing MD orders.            Precautionary checks every 15 minutes for safety maintained, room free of safety hazards, patient sustains no injury or falls during this shift. Will endorse care to next shift.

## 2018-09-01 NOTE — BHH Group Notes (Signed)
[place] Group Notes:  (Nursing/MHT/Case Management/Adjunct)  Date:  09/01/2018  Time:  3:01 PM  Type of Therapy:  Psychoeducational Skills  Participation Level:  Did Not Attend    Ivan Hamilton 09/01/2018, 3:01 PM

## 2018-09-01 NOTE — Progress Notes (Signed)
  [facility] Adult Case Management Discharge Plan :  Will you be returning to the same living situation after discharge:  Yes,  lives with wife and children At discharge, do you have transportation home?: Yes,  family will assist with transportation Do you have the ability to pay for your medications: Yes,  insurance  Release of information consent forms completed and in the chart;  Patient's signature needed at discharge.  Patient to Follow up at: Follow-up Information    [place] on 09/03/2018.   Why:  Please follow up at [facility] on Wednesday, September 03, 2018 at 930am. Thank you. Contact information: [place] [address] [phone]           Next level of care provider has access to [place] and Suicide Prevention discussed: Yes,  with pt  Have you used any form of tobacco in the last 30 days? (Cigarettes, Smokeless Tobacco, Cigars, and/or Pipes): Yes("I never smoke or did drugs")  Has patient been referred to the Quitline?: Physician counseled pt on smoking cessation  Patient has been referred for addiction treatment: N/A  Yvette Rack, LCSW 09/01/2018, 9:35 AM

## 2018-09-01 NOTE — BHH Suicide Risk Assessment (Signed)
[place] INPATIENT:  Family/Significant Other Suicide Prevention Education  Suicide Prevention Education:  Education Completed; [place], daughter [phone] has been identified by the patient as the family member/significant other with whom the patient will be residing, and identified as the person(s) who will aid the patient in the event of a mental health crisis (suicidal ideations/suicide attempt).  With written consent from the patient, the family member/significant other has been provided the following suicide prevention education, prior to the and/or following the discharge of the patient.  The suicide prevention education provided includes the following:  Suicide risk factors  Suicide prevention and interventions  National Suicide Hotline telephone number  [facility] assessment telephone number  [facility] Emergency Assistance [place] and/or Residential Mobile Crisis Unit telephone number  Request made of family/significant other to:  Remove weapons (e.g., guns, rifles, knives), all items previously/currently identified as safety concern.    Remove drugs/medications (over-the-counter, prescriptions, illicit drugs), all items previously/currently identified as a safety concern.  The family member/significant other verbalizes understanding of the suicide prevention education information provided.  The family member/significant other agrees to remove the items of safety concern listed above. Ms. Greis discussed pt's hx of depression and alcoholism. She states having no reservation about pt returning to the home and denies pt having access to guns or weapons. CSW encouraged her to encourage pt to follow up with [facility] and attend all scheduled appointments. She agrees to do so. CSW informed Ms. Ramer should the pt need further medical attention to call 911 or bring him to the nearest emergency department.   Tyresa Prindiville T Ardel Jagger 09/01/2018, 11:36 AM

## 2018-09-01 NOTE — BHH Suicide Risk Assessment (Signed)
[place] INPATIENT:  Family/Significant Other Suicide Prevention Education  Suicide Prevention Education:  Contact Attempts: Sachit Gilman, wife [phone] has been identified by the patient as the family member/significant other with whom the patient will be residing, and identified as the person(s) who will aid the patient in the event of a mental health crisis.  With written consent from the patient, two attempts were made to provide suicide prevention education, prior to and/or following the patient's discharge.  We were unsuccessful in providing suicide prevention education.  A suicide education pamphlet was given to the patient to share with family/significant other.  Date and time of first attempt: 09/01/18 930am left vm Date and time of second attempt:  Yvette Rack 09/01/2018, 9:29 AM

## 2018-09-05 ENCOUNTER — Encounter: Payer: Self-pay | Admitting: *Deleted

## 2018-09-23 ENCOUNTER — Other Ambulatory Visit: Payer: Self-pay

## 2018-09-23 ENCOUNTER — Encounter: Payer: Self-pay | Admitting: Emergency Medicine

## 2018-09-23 ENCOUNTER — Emergency Department
Admission: EM | Admit: 2018-09-23 | Discharge: 2018-09-24 | Disposition: A | Discharge status: Home / Self Care | Payer: Medicare HMO | Attending: Emergency Medicine | Admitting: Emergency Medicine

## 2018-09-23 DIAGNOSIS — E039 Hypothyroidism, unspecified: Secondary | ICD-10-CM | POA: Insufficient documentation

## 2018-09-23 DIAGNOSIS — F17210 Nicotine dependence, cigarettes, uncomplicated: Secondary | ICD-10-CM | POA: Insufficient documentation

## 2018-09-23 DIAGNOSIS — Z79899 Other long term (current) drug therapy: Secondary | ICD-10-CM | POA: Insufficient documentation

## 2018-09-23 DIAGNOSIS — I10 Essential (primary) hypertension: Secondary | ICD-10-CM | POA: Insufficient documentation

## 2018-09-23 DIAGNOSIS — F10929 Alcohol use, unspecified with intoxication, unspecified: Secondary | ICD-10-CM | POA: Diagnosis present

## 2018-09-23 DIAGNOSIS — F10231 Alcohol dependence with withdrawal delirium: Secondary | ICD-10-CM

## 2018-09-23 DIAGNOSIS — R45851 Suicidal ideations: Secondary | ICD-10-CM | POA: Insufficient documentation

## 2018-09-23 DIAGNOSIS — F1010 Alcohol abuse, uncomplicated: Secondary | ICD-10-CM | POA: Insufficient documentation

## 2018-09-23 DIAGNOSIS — F329 Major depressive disorder, single episode, unspecified: Secondary | ICD-10-CM | POA: Diagnosis present

## 2018-09-23 LAB — CBC
HCT: 43.7 % (ref 39.0–52.0)
Hemoglobin: 14.5 g/dL (ref 13.0–17.0)
MCH: 29.2 pg (ref 26.0–34.0)
MCHC: 33.2 g/dL (ref 30.0–36.0)
MCV: 88.1 fL (ref 80.0–100.0)
Platelets: 157 10*3/uL (ref 150–400)
RBC: 4.96 MIL/uL (ref 4.22–5.81)
RDW: 16.1 % — ABNORMAL HIGH (ref 11.5–15.5)
WBC: 8.1 10*3/uL (ref 4.0–10.5)
nRBC: 0 % (ref 0.0–0.2)

## 2018-09-23 LAB — URINE DRUG SCREEN, QUALITATIVE (ARMC ONLY)
Amphetamines, Ur Screen: NOT DETECTED
Barbiturates, Ur Screen: NOT DETECTED
Benzodiazepine, Ur Scrn: NOT DETECTED
Cannabinoid 50 Ng, Ur ~~LOC~~: NOT DETECTED
Cocaine Metabolite,Ur ~~LOC~~: NOT DETECTED
MDMA (Ecstasy)Ur Screen: NOT DETECTED
Methadone Scn, Ur: NOT DETECTED
Opiate, Ur Screen: NOT DETECTED
Phencyclidine (PCP) Ur S: NOT DETECTED
Tricyclic, Ur Screen: NOT DETECTED

## 2018-09-23 LAB — COMPREHENSIVE METABOLIC PANEL
ALT: 28 U/L (ref 0–44)
AST: 45 U/L — ABNORMAL HIGH (ref 15–41)
Albumin: 4.7 g/dL (ref 3.5–5.0)
Alkaline Phosphatase: 80 U/L (ref 38–126)
Anion gap: 13 (ref 5–15)
BUN: 7 mg/dL (ref 6–20)
CO2: 26 mmol/L (ref 22–32)
Calcium: 8.7 mg/dL — ABNORMAL LOW (ref 8.9–10.3)
Chloride: 104 mmol/L (ref 98–111)
Creatinine, Ser: 0.83 mg/dL (ref 0.61–1.24)
GFR calc Af Amer: >60 mL/min (ref >60)
GFR calc non Af Amer: >60 mL/min (ref >60)
Glucose, Bld: 102 mg/dL — ABNORMAL HIGH (ref 70–99)
Potassium: 3.8 mmol/L (ref 3.5–5.1)
Sodium: 143 mmol/L (ref 135–145)
Total Bilirubin: 1 mg/dL (ref 0.3–1.2)
Total Protein: 8.8 g/dL — ABNORMAL HIGH (ref 6.5–8.1)

## 2018-09-23 LAB — SALICYLATE LEVEL: Salicylate Lvl: <7 mg/dL (ref 2.8–30.0)

## 2018-09-23 LAB — ACETAMINOPHEN LEVEL: Acetaminophen (Tylenol), Serum: <10 ug/mL — ABNORMAL LOW (ref 10–30)

## 2018-09-23 LAB — ETHANOL: Alcohol, Ethyl (B): 420 mg/dL (ref <10)

## 2018-09-23 MED ORDER — LORAZEPAM 2 MG/ML IJ SOLN: 0.0000 mg | Freq: Two times a day (BID) | INTRAMUSCULAR | Status: DC

## 2018-09-23 MED ORDER — THIAMINE HCL 100 MG/ML IJ SOLN: 100.0000 mg | Freq: Every day | INTRAMUSCULAR | Status: DC

## 2018-09-23 MED ORDER — LORAZEPAM 2 MG PO TABS
0.0000 mg | ORAL_TABLET | Freq: Four times a day (QID) | ORAL | Status: DC
  Administered 2018-09-23: 2 mg via ORAL
  Administered 2018-09-24: 1 mg via ORAL
  Filled 2018-09-23 (×2): qty 1

## 2018-09-23 MED ORDER — VITAMIN B-1 100 MG PO TABS
100.0000 mg | ORAL_TABLET | Freq: Every day | ORAL | Status: DC
  Administered 2018-09-24: 100 mg via ORAL
  Filled 2018-09-23: qty 1

## 2018-09-23 MED ORDER — ZIPRASIDONE MESYLATE 20 MG IM SOLR
20.0000 mg | Freq: Once | INTRAMUSCULAR | Status: AC
  Administered 2018-09-23: 20 mg via INTRAMUSCULAR
  Filled 2018-09-23: qty 20

## 2018-09-23 MED ORDER — LORAZEPAM 2 MG/ML IJ SOLN: 0.0000 mg | Freq: Four times a day (QID) | INTRAMUSCULAR | Status: DC

## 2018-09-23 MED ORDER — LORAZEPAM 2 MG PO TABS: 0.0000 mg | ORAL_TABLET | Freq: Two times a day (BID) | ORAL | Status: DC

## 2018-09-23 NOTE — ED Notes (Signed)
Pt. C/o loudly about left lower rib margin pain. Dr. Archie Balboa notified.

## 2018-09-23 NOTE — ED Notes (Signed)
Pt belongings:   Risk analyst   Pt dressed out by [employer]   Pt is now moved to [place] ED RM20.

## 2018-09-23 NOTE — ED Notes (Signed)
Hourly rounding reveals patient in room. No complaints, stable, in no acute distress. Q15 minute rounds and monitoring via Rover and Officer to continue.   

## 2018-09-23 NOTE — ED Notes (Signed)
Patient moved to Room 20.

## 2018-09-23 NOTE — Discharge Instructions (Signed)
Please seek medical attention and help for any thoughts about wanting to harm yourself, harm others, any concerning change in behavior, severe depression, inappropriate drug use or any other new or concerning symptoms. ° °

## 2018-09-23 NOTE — ED Provider Notes (Signed)
[facility] Emergency Department Provider Note  ____________________________________________   I have reviewed the triage vital signs and the nursing notes.   HISTORY  Chief Complaint Suicidal   History limited by and level 5 caveat due to: Alcohol intoxication  HPI Ivan Hamilton is a 53 y.o. male who presents to the emergency department today because of concern for depression. The patient states that he has wanted to hurt himself for a long time. He says he has not been speaking to a psychiatrist. Does admit to using alcohol tonight. 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   Per medical record review patient has a history of recent admission for depression and alcohol use.   Past Medical History:  Diagnosis Date  . Alcohol withdrawal ([place])   . B12 deficiency   . Chronic pain   . CVA (cerebral vascular accident) ([place])   . Hypertension   . Hypothyroidism   . Varicose veins     Patient Active Problem List   Diagnosis Date Noted  . Suicidal behavior 08/28/2018  . Severe recurrent major depressive disorder with psychotic features ([place]) 08/28/2018  . Alcohol intoxication ([place]) 08/28/2018  . Suicidal ideation 08/28/2018  . Duodenitis   . Gastritis without bleeding   . Hypokalemia 10/22/2017  . Intractable vomiting with nausea   . Depression 04/10/2016  . Alcohol withdrawal delirium ([place])   . Alcohol withdrawal ([place]) 09/02/2015  . Alcohol use disorder, severe, dependence ([place]) 09/02/2015  . 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[place]) 09/02/2015  . PTSD (post-traumatic stress disorder) 09/01/2015  . Hypothyroid 09/01/2015  . Gastric reflux 09/01/2015  . DDD (degenerative disc disease), lumbosacral 09/01/2015  . 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 Vitamin B12 deficiency 08/17/2015  . Pain of lumbar spine 05/19/2015  . 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 Varicose vein of leg 04/25/2015  . Abnormal LFTs (liver function tests) 03/31/2015  . Confusion state 03/31/2015  . Chronic pain of left lower extremity 03/31/2015    Past Surgical History:  Procedure Laterality Date  . CHOLECYSTECTOMY    . ESOPHAGOGASTRODUODENOSCOPY (EGD) WITH PROPOFOL N/A 10/24/2017   Procedure: ESOPHAGOGASTRODUODENOSCOPY (EGD) WITH PROPOFOL;  Surgeon: [name], MD;  Location: [facility] ENDOSCOPY;  Service: Endoscopy;  Laterality: N/A;  . LEG SURGERY      Prior to Admission medications   Medication Sig Start Date End Date Taking? Authorizing Provider  busPIRone (BUSPAR) 15 MG tablet Take 1 tablet (15 mg total) by mouth 2 (two) times daily. 09/01/18   Pucilowska, Herma Ard B, MD  FLUoxetine (PROZAC) 40 MG capsule Take 1 capsule (40 mg total) by mouth daily. 09/02/18   Pucilowska, Herma Ard B, MD  gabapentin (NEURONTIN) 300 MG capsule Take 2 capsules (600 mg total) by mouth 3 (three) times daily. 09/01/18   Pucilowska, Wardell Honour, MD  levothyroxine (SYNTHROID, LEVOTHROID) 125 MCG tablet Take 1 tablet (125 mcg total) by mouth daily before breakfast. 09/01/18   Pucilowska, Jolanta B, MD  prazosin (MINIPRESS) 1 MG capsule Take 1 capsule (1 mg total) by mouth at bedtime. 09/01/18   Pucilowska, Herma Ard B, MD  QUEtiapine (SEROQUEL XR) 200 MG 24 hr tablet Take 1 tablet (200 mg total) by mouth at bedtime. 09/01/18   Pucilowska, Wardell Honour, MD    Allergies Patient has no known allergies.  No family history on file.  Social History Social History   Tobacco Use  .  Smoking status: Current Every Day Smoker  . Smokeless tobacco: Never Used  Substance Use Topics  . Alcohol use: Yes    Comment: daily- 1 pint of vodka  . Drug use: No    Review of Systems Constitutional: No fever/chills Eyes: No visual changes. ENT: No sore throat. Cardiovascular: Denies chest pain. Respiratory: Denies shortness of breath. Gastrointestinal: No  abdominal pain.  No nausea, no vomiting.  No diarrhea.   Genitourinary: Negative for dysuria. Musculoskeletal: Negative for back pain. Skin: Negative for rash. Neurological: Negative for headaches, focal weakness or numbness.   ____________________________________________   PHYSICAL EXAM:  VITAL SIGNS: ED Triage Vitals  Enc Vitals Group     BP 129/88     Pulse 111     Resp 18     Temp 98.1     Temp src      SpO2 100   Constitutional: Awake and alert, appears intoxicated Eyes: Conjunctivae are normal.  ENT      Head: Normocephalic and atraumatic.      Nose: No congestion/rhinnorhea.      Mouth/Throat: Mucous membranes are moist.      Neck: No stridor. Hematological/Lymphatic/Immunilogical: No cervical lymphadenopathy. Cardiovascular: Normal rate, regular rhythm.  No murmurs, rubs, or gallops.  Respiratory: Normal respiratory effort without tachypnea nor retractions. Breath sounds are clear and equal bilaterally. No wheezes/rales/rhonchi. Gastrointestinal: Soft and non tender. No rebound. No guarding.  Genitourinary: Deferred Musculoskeletal: Normal range of motion in all extremities. No lower extremity edema. Neurologic:  Intoxicated. Moving all extremities.  Skin:  Skin is warm, dry and intact. No rash noted. Psychiatric: Intoxicated. States he is suicidal ____________________________________________    LABS (pertinent positives/negatives)  Ethanol 420 Acetaminophen, salicylate below threshold UDS none detected CBC wbc 8.1, hgb 14.5, plt 157 CMP na 143, glu 102, cr 0.83  ____________________________________________   EKG  I, Nance Pear, attending physician, personally viewed and interpreted this EKG  EKG Time: 1902 Rate: 95 Rhythm: normal sinus rhythm Axis: normal Intervals: qtc 459 QRS: narrow ST changes: no st elevation Impression: normal ekg  ____________________________________________     RADIOLOGY  None  ____________________________________________   PROCEDURES  Procedures  ____________________________________________   INITIAL IMPRESSION / ASSESSMENT AND PLAN / ED COURSE  Pertinent labs & imaging results that were available during my care of the patient were reviewed by me and considered in my medical decision making (see chart for details).   Patient presents to the emergency department today because of concerns for suicidal ideation and alcohol abuse.  On exam patient does appear intoxicated.  Blood work supports this with a significantly elevated alcohol level.  Given that he is stating that he was to commit suicide he was placed under IVC.  Will have psychiatry evaluate.  ____________________________________________   FINAL CLINICAL IMPRESSION(S) / ED DIAGNOSES  Final diagnoses:  Suicidal ideation  Alcohol abuse     Note: This dictation was prepared with Dragon dictation. Any transcriptional errors that result from this process are unintentional     Nance Pear, MD 09/23/18 2348

## 2018-09-23 NOTE — ED Notes (Signed)
Pt remains relaxed in bed. Calm.

## 2018-09-23 NOTE — ED Notes (Signed)
IV Dcd with cath intact. 

## 2018-09-23 NOTE — ED Notes (Signed)
Hourly rounding reveals patient in room talking to Officer. Stable, in no acute distress. Q15 minute rounds and monitoring via Engineer, drilling to continue.

## 2018-09-23 NOTE — ED Notes (Signed)
Pt denies being able to [employer]. However, when asked if pt wants to harm self or has plan to kill himself he states "yes, just give me something to do it with." Do not state his specific plan. Pt then started trying to flirt with this RN.

## 2018-09-23 NOTE — ED Notes (Signed)
Verb order per EDP Archie Balboa to d/c 1:1 sitter.

## 2018-09-23 NOTE — ED Notes (Signed)
Pt resting in bed. Calm.

## 2018-09-23 NOTE — ED Triage Notes (Signed)
Pt to ED via EMS from home c/o being suicidal, denies HI.  Per EMS pt is heavy drinker and has been drinking per family.  Pt not wanting to answer anymore questions, states "I don't want to talk to you".  EMS states pt non compliant with meds, was slamming head on floor and stating wanting to kill himself and others and was scaring family.  Unsure if used drugs today or amount of alcohol used.  Pt with psych hx and hx of PTSD, prior military.  EMS vitals 152/100 BP, 120/130 HR, 97% RA, A&O.

## 2018-09-23 NOTE — ED Notes (Signed)
Hourly rounding reveals patient sleeping in room. No complaints, stable, in no acute distress. Q15 minute rounds and monitoring via Rover and Officer to continue.  

## 2018-09-24 DIAGNOSIS — F411 Generalized anxiety disorder: Secondary | ICD-10-CM

## 2018-09-24 DIAGNOSIS — F4312 Post-traumatic stress disorder, chronic: Secondary | ICD-10-CM | POA: Diagnosis not present

## 2018-09-24 DIAGNOSIS — F10929 Alcohol use, unspecified with intoxication, unspecified: Secondary | ICD-10-CM | POA: Diagnosis not present

## 2018-09-24 DIAGNOSIS — F331 Major depressive disorder, recurrent, moderate: Secondary | ICD-10-CM | POA: Diagnosis not present

## 2018-09-24 DIAGNOSIS — E039 Hypothyroidism, unspecified: Secondary | ICD-10-CM

## 2018-09-24 DIAGNOSIS — F1020 Alcohol dependence, uncomplicated: Secondary | ICD-10-CM

## 2018-09-24 DIAGNOSIS — F515 Nightmare disorder: Secondary | ICD-10-CM

## 2018-09-24 LAB — TSH: TSH: 3.912 u[IU]/mL (ref 0.350–4.500)

## 2018-09-24 MED ORDER — LEVOTHYROXINE SODIUM 75 MCG PO TABS: 125.0000 ug | ORAL_TABLET | Freq: Every day | ORAL | Status: DC

## 2018-09-24 MED ORDER — PROPRANOLOL HCL 10 MG PO TABS
10.0000 mg | ORAL_TABLET | Freq: Once | ORAL | Status: AC
  Administered 2018-09-24: 10 mg via ORAL
  Filled 2018-09-24: qty 1

## 2018-09-24 MED ORDER — GABAPENTIN 300 MG PO CAPS: 600.0000 mg | ORAL_CAPSULE | Freq: Three times a day (TID) | ORAL | 1 refills | Status: DC

## 2018-09-24 MED ORDER — PRAZOSIN HCL 1 MG PO CAPS
1.0000 mg | ORAL_CAPSULE | Freq: Every day | ORAL | Status: DC
  Filled 2018-09-24: qty 1

## 2018-09-24 MED ORDER — QUETIAPINE FUMARATE ER 200 MG PO TB24: 200.0000 mg | ORAL_TABLET | Freq: Every day | ORAL | 0 refills | Status: DC

## 2018-09-24 MED ORDER — LORAZEPAM 1 MG PO TABS
1.0000 mg | ORAL_TABLET | Freq: Once | ORAL | Status: AC
  Administered 2018-09-24: 1 mg via ORAL
  Filled 2018-09-24: qty 1

## 2018-09-24 MED ORDER — NALTREXONE HCL 50 MG PO TABS
50.0000 mg | ORAL_TABLET | Freq: Every day | ORAL | Status: DC
  Administered 2018-09-24: 50 mg via ORAL
  Filled 2018-09-24: qty 1

## 2018-09-24 MED ORDER — NALTREXONE HCL 50 MG PO TABS: 50.0000 mg | ORAL_TABLET | Freq: Every day | ORAL | 0 refills | Status: DC

## 2018-09-24 MED ORDER — LEVOTHYROXINE SODIUM 75 MCG PO TABS
125.0000 ug | ORAL_TABLET | Freq: Every day | ORAL | Status: DC
  Administered 2018-09-24: 125 ug via ORAL
  Filled 2018-09-24: qty 2

## 2018-09-24 MED ORDER — GABAPENTIN 300 MG PO CAPS
600.0000 mg | ORAL_CAPSULE | Freq: Three times a day (TID) | ORAL | Status: DC
  Administered 2018-09-24 (×2): 600 mg via ORAL
  Filled 2018-09-24 (×2): qty 2

## 2018-09-24 MED ORDER — FLUOXETINE HCL 20 MG PO CAPS
40.0000 mg | ORAL_CAPSULE | Freq: Every day | ORAL | Status: DC
  Administered 2018-09-24: 40 mg via ORAL
  Filled 2018-09-24: qty 2

## 2018-09-24 MED ORDER — FLUOXETINE HCL 40 MG PO CAPS: 40.0000 mg | ORAL_CAPSULE | Freq: Every day | ORAL | 0 refills | Status: DC

## 2018-09-24 MED ORDER — PROPRANOLOL HCL 10 MG PO TABS: 10.0000 mg | ORAL_TABLET | Freq: Two times a day (BID) | ORAL | 0 refills | Status: DC | PRN

## 2018-09-24 MED ORDER — QUETIAPINE FUMARATE ER 200 MG PO TB24
200.0000 mg | ORAL_TABLET | Freq: Every day | ORAL | Status: DC
  Filled 2018-09-24: qty 1

## 2018-09-24 MED ORDER — LEVOTHYROXINE SODIUM 125 MCG PO TABS: 125.0000 ug | ORAL_TABLET | Freq: Every day | ORAL | 0 refills | Status: DC

## 2018-09-24 MED ORDER — BUSPIRONE HCL 10 MG PO TABS
15.0000 mg | ORAL_TABLET | Freq: Two times a day (BID) | ORAL | Status: DC
  Administered 2018-09-24: 15 mg via ORAL
  Filled 2018-09-24: qty 2

## 2018-09-24 MED ORDER — PRAZOSIN HCL 1 MG PO CAPS: 1.0000 mg | ORAL_CAPSULE | Freq: Every day | ORAL | 0 refills | Status: DC

## 2018-09-24 MED ORDER — BUSPIRONE HCL 15 MG PO TABS: 15.0000 mg | ORAL_TABLET | Freq: Two times a day (BID) | ORAL | 0 refills | Status: DC

## 2018-09-24 NOTE — ED Notes (Signed)
Hourly rounding reveals patient in room. No complaints, stable, in no acute distress. Q15 minute rounds and monitoring via [employer] to continue. 

## 2018-09-24 NOTE — ED Notes (Signed)
Pt given breakfast tray. Hand hygiene encouraged.  

## 2018-09-24 NOTE — ED Notes (Signed)
Pt attempted to call his wife for a ride home - left a message.

## 2018-09-24 NOTE — Consult Note (Signed)
[facility] Psychiatry Consult   Reason for Consult: Alcohol Intoxication Referring Physician:  Dr. Archie Balboa Patient Identification: Ivan Hamilton MRN:  [id number] Principal Diagnosis: Alcohol intoxication ([place]) Diagnosis:  Principal Problem:   Alcohol intoxication ([place])   Total Time spent with patient: 1 hour  Subjective:   Alejandra Barna is a 53 y.o. male patient presented to [facility] ED via [employer]. The patient was Involuntarily Committed (IVC) upon arrival to the ED. The patient was seen face-to-face by this provider; chart reviewed and consulted with Dr. Archie Balboa on 09/23/2018 due to the care of the patient.  It was discussed with Dr. Archie Balboa that the patient is very intoxicated and he is voicing suicidal ideation by stating he is going to "drink himself to death." The patient voiced " I will never hurt myself or hurt no one else". Based on the patient description of suicidal ideation he should be reassessed in the morning. During his assessment the patient denies homicidal ideation. The patient states "no body can kill me. I am going to kill myself by drinking myself to death." Patient alcohol level is 420 cm on this ED visit. During his evaluation he is alert and oriented x4, agitated and somewhat cooperative. The patient is very emotional during this encounter. The patient states "I want to die, because all my boys die and I watched them." The patient voiced " I will never stop drinking".  "I have a ex-wife and I love her". "That's is crazy I love my ex-wife."  The patient denies any homicidal ideations. The patient is not presenting with any psychotic or paranoid behaviors. During this encounter with the patient he is able to answer questions appropriately along with some emotional outburst.  HPI:  Per Dr. Archie Balboa; Ivan Hamilton is a 53 y.o. male who presents to the emergency department today because of concern for depression. The patient states that he has wanted  to hurt himself for a long time. He says he has not been speaking to a psychiatrist. Does admit to using alcohol tonight. 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   Past Psychiatric History:  Alcohol withdrawal  Risk to Self:  not currently Risk to Others:  No Prior Inpatient Therapy:  Yes Prior Outpatient Therapy:   Yes  Past Medical History:  Past Medical History:  Diagnosis Date  . Alcohol withdrawal ([place])   . B12 deficiency   . Chronic pain   . CVA (cerebral vascular accident) ([place])   . Hypertension   . Hypothyroidism   . Varicose veins     Past Surgical History:  Procedure Laterality Date  . CHOLECYSTECTOMY    . ESOPHAGOGASTRODUODENOSCOPY (EGD) WITH PROPOFOL N/A 10/24/2017   Procedure: ESOPHAGOGASTRODUODENOSCOPY (EGD) WITH PROPOFOL;  Surgeon: [name], MD;  Location: [facility] ENDOSCOPY;  Service: Endoscopy;  Laterality: N/A;  . LEG SURGERY     Family History: History reviewed. No pertinent family history. Family Psychiatric  History: None given Social History:  Social History   Substance and Sexual Activity  Alcohol Use Yes   Comment: daily- 1 pint of vodka     Social History   Substance and Sexual Activity  Drug Use No    Social History   Socioeconomic History  . Marital status: Married    Spouse name: Not on file  . Number of children: Not on file  . Years of education: Not on file  . Highest education level: Not on file  Occupational History  .  Not on file  Social Needs  . Financial resource strain: Not on file  . Food insecurity:    Worry: Not on file    Inability: Not on file  . Transportation needs:    Medical: Not on file    Non-medical: Not on file  Tobacco Use  . Smoking status: Current Every Day Smoker  . Smokeless tobacco: Never Used  Substance and Sexual Activity  . Alcohol use: Yes    Comment: daily- 1 pint of vodka  . Drug use: No  . Sexual activity: Not on file  Lifestyle  . Physical  activity:    Days per week: Not on file    Minutes per session: Not on file  . Stress: Not on file  Relationships  . Social connections:    Talks on phone: Not on file    Gets together: Not on file    Attends religious service: Not on file    Active member of club or organization: Not on file    Attends meetings of clubs or organizations: Not on file    Relationship status: Not on file  Other Topics Concern  . Not on file  Social History Narrative  . Not on file   Additional Social History:    Allergies:  No Known Allergies  Labs:  Results for orders placed or performed during the hospital encounter of 09/23/18 (from the past 48 hour(s))  Comprehensive metabolic panel     Status: Abnormal   Collection Time: 09/23/18  7:10 PM  Result Value Ref Range   Sodium 143 135 - 145 mmol/L   Potassium 3.8 3.5 - 5.1 mmol/L   Chloride 104 98 - 111 mmol/L   CO2 26 22 - 32 mmol/L   Glucose, Bld 102 (H) 70 - 99 mg/dL   BUN 7 6 - 20 mg/dL   Creatinine, Ser 0.83 0.61 - 1.24 mg/dL   Calcium 8.7 (L) 8.9 - 10.3 mg/dL   Total Protein 8.8 (H) 6.5 - 8.1 g/dL   Albumin 4.7 3.5 - 5.0 g/dL   AST 45 (H) 15 - 41 U/L   ALT 28 0 - 44 U/L   Alkaline Phosphatase 80 38 - 126 U/L   Total Bilirubin 1.0 0.3 - 1.2 mg/dL   GFR calc non Af Amer >60 >60 mL/min   GFR calc Af Amer >60 >60 mL/min   Anion gap 13 5 - 15    Comment: Performed at [facility], [address] [place], [place] [address]  Ethanol     Status: Abnormal   Collection Time: 09/23/18  7:10 PM  Result Value Ref Range   Alcohol, Ethyl (B) 420 (HH) <10 mg/dL    Comment: CRITICAL RESULT CALLED TO, READ BACK BY AND VERIFIED WITH GARY [facility] AT 2036 09/23/2018 SMA (NOTE) Lowest detectable limit for serum alcohol is 10 mg/dL. For medical purposes only. Performed at [facility], [address] [place], [place] [address]   Salicylate level     Status: None   Collection Time: 09/23/18  7:10 PM  Result Value Ref Range    Salicylate Lvl <1.4 2.8 - 30.0 mg/dL    Comment: Performed at [facility], [address] [place],  [address]  Acetaminophen level     Status: Abnormal   Collection Time: 09/23/18  7:10 PM  Result Value Ref Range   Acetaminophen (Tylenol), Serum <10 (L) 10 - 30 ug/mL    Comment: (NOTE) Therapeutic concentrations vary significantly. A range  of 10-30 ug/mL  may be an effective concentration for many patients. However, some  are best treated at concentrations outside of this range. Acetaminophen concentrations >150 ug/mL at 4 hours after ingestion  and >50 ug/mL at 12 hours after ingestion are often associated with  toxic reactions. Performed at [facility], [address] [place], [place] [address]   cbc     Status: Abnormal   Collection Time: 09/23/18  7:10 PM  Result Value Ref Range   WBC 8.1 4.0 - 10.5 K/uL   RBC 4.96 4.22 - 5.81 MIL/uL   Hemoglobin 14.5 13.0 - 17.0 g/dL   HCT 43.7 39.0 - 52.0 %   MCV 88.1 80.0 - 100.0 fL   MCH 29.2 26.0 - 34.0 pg   MCHC 33.2 30.0 - 36.0 g/dL   RDW 16.1 (H) 11.5 - 15.5 %   Platelets 157 150 - 400 K/uL   nRBC 0.0 0.0 - 0.2 %    Comment: Performed at [facility], [address] [place], [place] [address]  Urine Drug Screen, Qualitative     Status: None   Collection Time: 09/23/18  8:40 PM  Result Value Ref Range   Tricyclic, Ur Screen NONE DETECTED NONE DETECTED   Amphetamines, Ur Screen NONE DETECTED NONE DETECTED   MDMA (Ecstasy)Ur Screen NONE DETECTED NONE DETECTED   Cocaine Metabolite,Ur [employer] NONE DETECTED NONE DETECTED   Opiate, Ur Screen NONE DETECTED NONE DETECTED   Phencyclidine (PCP) Ur S NONE DETECTED NONE DETECTED   Cannabinoid 50 Ng, Ur [place] NONE DETECTED NONE DETECTED   Barbiturates, Ur Screen NONE DETECTED NONE DETECTED   Benzodiazepine, Ur Scrn NONE DETECTED NONE DETECTED   Methadone Scn, Ur NONE DETECTED NONE DETECTED    Comment: (NOTE) Tricyclics + metabolites, urine    Cutoff 1000  ng/mL Amphetamines + metabolites, urine  Cutoff 1000 ng/mL MDMA (Ecstasy), urine              Cutoff 500 ng/mL Cocaine Metabolite, urine          Cutoff 300 ng/mL Opiate + metabolites, urine        Cutoff 300 ng/mL Phencyclidine (PCP), urine         Cutoff 25 ng/mL Cannabinoid, urine                 Cutoff 50 ng/mL Barbiturates + metabolites, urine  Cutoff 200 ng/mL Benzodiazepine, urine              Cutoff 200 ng/mL Methadone, urine                   Cutoff 300 ng/mL The urine drug screen provides only a preliminary, unconfirmed analytical test result and should not be used for non-medical purposes. Clinical consideration and professional judgment should be applied to any positive drug screen result due to possible interfering substances. A more specific alternate chemical method must be used in order to obtain a confirmed analytical result. Gas chromatography / mass spectrometry (GC/MS) is the preferred confirmat ory method. Performed at [facility], [address] [place], [place] [address]     Current Facility-Administered Medications  Medication Dose Route Frequency Provider Last Rate Last Dose  . LORazepam (ATIVAN) injection 0-4 mg  0-4 mg Intravenous Q6H Nance Pear, MD       Or  . LORazepam (ATIVAN) tablet 0-4 mg  0-4 mg Oral Q6H Nance Pear, MD   2 mg at 09/23/18 2353  . [START ON 09/26/2018] LORazepam (  ATIVAN) injection 0-4 mg  0-4 mg Intravenous Q12H Nance Pear, MD       Or  . Derrill Memo ON 09/26/2018] LORazepam (ATIVAN) tablet 0-4 mg  0-4 mg Oral Q12H Nance Pear, MD      . thiamine (VITAMIN B-1) tablet 100 mg  100 mg Oral Daily Nance Pear, MD       Or  . thiamine (B-1) injection 100 mg  100 mg Intravenous Daily Nance Pear, MD       Current Outpatient Medications  Medication Sig Dispense Refill  . busPIRone (BUSPAR) 15 MG tablet Take 1 tablet (15 mg total) by mouth 2 (two) times daily. 60 tablet 1  . FLUoxetine (PROZAC) 40 MG  capsule Take 1 capsule (40 mg total) by mouth daily. 30 capsule 3  . gabapentin (NEURONTIN) 300 MG capsule Take 2 capsules (600 mg total) by mouth 3 (three) times daily. 180 capsule 1  . levothyroxine (SYNTHROID, LEVOTHROID) 125 MCG tablet Take 1 tablet (125 mcg total) by mouth daily before breakfast. 30 tablet 1  . prazosin (MINIPRESS) 1 MG capsule Take 1 capsule (1 mg total) by mouth at bedtime. 30 capsule 1  . QUEtiapine (SEROQUEL XR) 200 MG 24 hr tablet Take 1 tablet (200 mg total) by mouth at bedtime. 30 tablet 1    Musculoskeletal: Strength & Muscle Tone: within normal limits Gait & Station: unsteady Patient leans: N/A  Psychiatric Specialty Exam: Physical Exam  Nursing note and vitals reviewed. Constitutional: He is oriented to person, place, and time. He appears well-developed and well-nourished.  Eyes: Pupils are equal, round, and reactive to light. Conjunctivae and EOM are normal.  Neck: Normal range of motion. Neck supple.  Cardiovascular: Normal rate and regular rhythm.  Respiratory: Effort normal and breath sounds normal.  GI: Soft.  Musculoskeletal: Normal range of motion.  Neurological: He is alert and oriented to person, place, and time. He has normal reflexes.  Skin: Skin is warm and dry.    Review of Systems  Constitutional: Negative.   HENT: Negative.   Eyes: Negative.   Respiratory: Negative.   Cardiovascular: Negative.   Gastrointestinal: Negative.   Genitourinary: Negative.   Musculoskeletal: Negative.   Skin: Negative.   Neurological: Positive for sensory change and speech change.  Endo/Heme/Allergies: Negative.   Psychiatric/Behavioral: Positive for depression, substance abuse and suicidal ideas. The patient is nervous/anxious and has insomnia.     Blood pressure (!) 143/94, pulse (!) 133, temperature 98.3 F (36.8 C), temperature source Oral, resp. rate 20, height 5\' 3"  (1.6 m), weight 63 kg, SpO2 96 %.Body mass index is 24.6 kg/m.  General  Appearance: Disheveled  Eye Contact:  Minimal  Speech:  Garbled and Pressured  Volume:  Increased  Mood:  Anxious, Depressed, Irritable and Worthless  Affect:  Constricted, Depressed and Tearful  Thought Process:  Linear  Orientation:  Full (Time, Place, and Person)  Thought Content:  Illogical and Rumination  Suicidal Thoughts:  Yes.  with intent/plan  Homicidal Thoughts:  No  Memory:  Immediate;   Poor  Judgement:  Poor  Insight:  Lacking  Psychomotor Activity:  Decreased, Restlessness, Tremor and Wide Base  Concentration:  Concentration: Poor  Recall:  Poor  Fund of Knowledge:  Poor  Language:  Poor  Akathisia:  NA  Handed:  Right  AIMS (if indicated):     Assets:  Desire for Improvement Physical Health Social Support  ADL's:  Intact  Cognition:  WNL  Sleep:   well  Treatment Plan Summary: Patient should be reassessed in the morning. Patient was able to answer most questions but due to his intoxication    Disposition: No evidence of imminent risk to self or others at present.    Patient required reassessment in the morning.  Lamont Dowdy, NP 09/24/2018 2:11 AM

## 2018-09-24 NOTE — ED Provider Notes (Signed)
-----------------------------------------   6:35 AM on 09/24/2018 -----------------------------------------   Blood pressure (!) 143/94, pulse (!) 133, temperature 98.3 F (36.8 C), temperature source Oral, resp. rate 20, height 1.6 m (5\' 3" ), weight 63 kg, SpO2 96 %.  The patient is calm and cooperative at this time.  There have been no acute events since the last update.  The patient was evaluated by Kennyth Lose with psychiatry, but she felt he needed reevaluation in the morning given that he was intoxicated at the time.   Hinda Kehr, MD 09/24/18 [phone]

## 2018-09-24 NOTE — ED Notes (Addendum)
Lunch tray placed in patient's room.

## 2018-09-24 NOTE — ED Notes (Signed)
Pt discharged to lobby. Patient's wife coming to bring him home. VS stable. All belongings returned to patient. Denies SI/HI. Prescriptions and discahrge instructions reviewed with patient. Pt signed for discharge.

## 2018-09-24 NOTE — ED Notes (Signed)
Dr. Leverne Humbles at bedside.
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
## 2018-09-24 NOTE — ED Notes (Signed)
Patient dressing for discharge. Maintained on 15 minute checks and observation by security camera for safety.

## 2018-09-24 NOTE — ED Notes (Signed)
Pt asking for medication to help his tremor and help him "calm down."  Dr. Leverne Humbles made aware. Medication will be administered as ordered. Maintained on 15 minute checks and observation by security camera for safety.

## 2018-09-24 NOTE — ED Notes (Signed)
Pt stated his ride (wife) will be here at 5 pm.

## 2018-09-24 NOTE — ED Notes (Signed)
Pt with increased tremor. Needing two hands to hold cup.  Dr. Leverne Humbles made aware. Medication administered as ordered.

## 2018-09-24 NOTE — ED Notes (Addendum)
Pt declined a shower. Maintained on 15 minute checks and observation by security camera for safety.

## 2019-06-01 DIAGNOSIS — F333 Major depressive disorder, recurrent, severe with psychotic symptoms: Secondary | ICD-10-CM | POA: Insufficient documentation

## 2019-06-03 ENCOUNTER — Other Ambulatory Visit: Payer: Self-pay | Admitting: Internal Medicine

## 2019-06-03 DIAGNOSIS — F1020 Alcohol dependence, uncomplicated: Secondary | ICD-10-CM

## 2019-06-03 DIAGNOSIS — R1010 Upper abdominal pain, unspecified: Secondary | ICD-10-CM

## 2019-06-08 ENCOUNTER — Telehealth: Payer: Self-pay | Admitting: *Deleted

## 2019-06-16 ENCOUNTER — Ambulatory Visit
Admission: RE | Admit: 2019-06-16 | Discharge: 2019-06-16 | Disposition: A | Discharge status: Home / Self Care | Payer: Medicare HMO | Source: Ambulatory Visit | Attending: Internal Medicine | Admitting: Internal Medicine

## 2019-06-16 ENCOUNTER — Other Ambulatory Visit: Payer: Self-pay

## 2019-06-16 DIAGNOSIS — F1020 Alcohol dependence, uncomplicated: Secondary | ICD-10-CM | POA: Insufficient documentation

## 2019-06-16 DIAGNOSIS — R1010 Upper abdominal pain, unspecified: Secondary | ICD-10-CM

## 2019-06-16 MED ORDER — IOHEXOL 300 MG/ML  SOLN
100.0000 mL | Freq: Once | INTRAMUSCULAR | Status: AC | PRN
  Administered 2019-06-16: 100 mL via INTRAVENOUS

## 2019-07-11 ENCOUNTER — Other Ambulatory Visit: Payer: Self-pay

## 2019-07-11 ENCOUNTER — Encounter: Payer: Self-pay | Admitting: Radiology

## 2019-07-11 ENCOUNTER — Inpatient Hospital Stay
Admission: EM | Admit: 2019-07-11 | Discharge: 2019-07-17 | DRG: 392 | Disposition: A | Discharge status: Home / Self Care | Payer: Medicare HMO | Attending: Internal Medicine | Admitting: Internal Medicine

## 2019-07-11 ENCOUNTER — Emergency Department: Payer: Medicare HMO

## 2019-07-11 ENCOUNTER — Inpatient Hospital Stay: Payer: Medicare HMO

## 2019-07-11 DIAGNOSIS — D696 Thrombocytopenia, unspecified: Secondary | ICD-10-CM | POA: Diagnosis not present

## 2019-07-11 DIAGNOSIS — E039 Hypothyroidism, unspecified: Secondary | ICD-10-CM | POA: Diagnosis present

## 2019-07-11 DIAGNOSIS — D649 Anemia, unspecified: Secondary | ICD-10-CM | POA: Diagnosis present

## 2019-07-11 DIAGNOSIS — R1013 Epigastric pain: Secondary | ICD-10-CM | POA: Diagnosis not present

## 2019-07-11 DIAGNOSIS — I361 Nonrheumatic tricuspid (valve) insufficiency: Secondary | ICD-10-CM | POA: Diagnosis not present

## 2019-07-11 DIAGNOSIS — Z9049 Acquired absence of other specified parts of digestive tract: Secondary | ICD-10-CM

## 2019-07-11 DIAGNOSIS — E538 Deficiency of other specified B group vitamins: Secondary | ICD-10-CM | POA: Diagnosis present

## 2019-07-11 DIAGNOSIS — E872 Acidosis, unspecified: Secondary | ICD-10-CM

## 2019-07-11 DIAGNOSIS — K3189 Other diseases of stomach and duodenum: Secondary | ICD-10-CM | POA: Diagnosis present

## 2019-07-11 DIAGNOSIS — K921 Melena: Secondary | ICD-10-CM | POA: Diagnosis present

## 2019-07-11 DIAGNOSIS — I10 Essential (primary) hypertension: Secondary | ICD-10-CM | POA: Diagnosis present

## 2019-07-11 DIAGNOSIS — F3289 Other specified depressive episodes: Secondary | ICD-10-CM | POA: Diagnosis not present

## 2019-07-11 DIAGNOSIS — F1010 Alcohol abuse, uncomplicated: Secondary | ICD-10-CM | POA: Diagnosis not present

## 2019-07-11 DIAGNOSIS — F329 Major depressive disorder, single episode, unspecified: Secondary | ICD-10-CM | POA: Diagnosis present

## 2019-07-11 DIAGNOSIS — R7401 Elevation of levels of liver transaminase levels: Secondary | ICD-10-CM

## 2019-07-11 DIAGNOSIS — K219 Gastro-esophageal reflux disease without esophagitis: Secondary | ICD-10-CM | POA: Diagnosis present

## 2019-07-11 DIAGNOSIS — K922 Gastrointestinal hemorrhage, unspecified: Secondary | ICD-10-CM | POA: Diagnosis not present

## 2019-07-11 DIAGNOSIS — Z20828 Contact with and (suspected) exposure to other viral communicable diseases: Secondary | ICD-10-CM | POA: Diagnosis present

## 2019-07-11 DIAGNOSIS — D6959 Other secondary thrombocytopenia: Secondary | ICD-10-CM | POA: Diagnosis present

## 2019-07-11 DIAGNOSIS — I951 Orthostatic hypotension: Secondary | ICD-10-CM | POA: Diagnosis present

## 2019-07-11 DIAGNOSIS — Z79890 Hormone replacement therapy (postmenopausal): Secondary | ICD-10-CM

## 2019-07-11 DIAGNOSIS — R55 Syncope and collapse: Secondary | ICD-10-CM

## 2019-07-11 DIAGNOSIS — Z79899 Other long term (current) drug therapy: Secondary | ICD-10-CM

## 2019-07-11 DIAGNOSIS — F419 Anxiety disorder, unspecified: Secondary | ICD-10-CM | POA: Diagnosis present

## 2019-07-11 DIAGNOSIS — R109 Unspecified abdominal pain: Secondary | ICD-10-CM

## 2019-07-11 DIAGNOSIS — I371 Nonrheumatic pulmonary valve insufficiency: Secondary | ICD-10-CM | POA: Diagnosis not present

## 2019-07-11 DIAGNOSIS — F4310 Post-traumatic stress disorder, unspecified: Secondary | ICD-10-CM | POA: Diagnosis present

## 2019-07-11 DIAGNOSIS — K766 Portal hypertension: Secondary | ICD-10-CM | POA: Diagnosis present

## 2019-07-11 DIAGNOSIS — Z8673 Personal history of transient ischemic attack (TIA), and cerebral infarction without residual deficits: Secondary | ICD-10-CM

## 2019-07-11 DIAGNOSIS — I8510 Secondary esophageal varices without bleeding: Secondary | ICD-10-CM | POA: Diagnosis present

## 2019-07-11 DIAGNOSIS — K7010 Alcoholic hepatitis without ascites: Secondary | ICD-10-CM | POA: Diagnosis present

## 2019-07-11 DIAGNOSIS — E876 Hypokalemia: Secondary | ICD-10-CM | POA: Diagnosis present

## 2019-07-11 DIAGNOSIS — Z720 Tobacco use: Secondary | ICD-10-CM

## 2019-07-11 DIAGNOSIS — K7030 Alcoholic cirrhosis of liver without ascites: Secondary | ICD-10-CM | POA: Diagnosis present

## 2019-07-11 DIAGNOSIS — K7460 Unspecified cirrhosis of liver: Secondary | ICD-10-CM | POA: Diagnosis not present

## 2019-07-11 DIAGNOSIS — E871 Hypo-osmolality and hyponatremia: Secondary | ICD-10-CM | POA: Diagnosis present

## 2019-07-11 DIAGNOSIS — F10231 Alcohol dependence with withdrawal delirium: Secondary | ICD-10-CM | POA: Diagnosis present

## 2019-07-11 DIAGNOSIS — G8929 Other chronic pain: Secondary | ICD-10-CM | POA: Diagnosis present

## 2019-07-11 DIAGNOSIS — I639 Cerebral infarction, unspecified: Secondary | ICD-10-CM | POA: Diagnosis present

## 2019-07-11 DIAGNOSIS — K5792 Diverticulitis of intestine, part unspecified, without perforation or abscess without bleeding: Principal | ICD-10-CM | POA: Diagnosis present

## 2019-07-11 LAB — CBC
HCT: 26.6 % — ABNORMAL LOW (ref 39.0–52.0)
HCT: 28.9 % — ABNORMAL LOW (ref 39.0–52.0)
Hemoglobin: 9.7 g/dL — ABNORMAL LOW (ref 13.0–17.0)
Hemoglobin: 10 g/dL — ABNORMAL LOW (ref 13.0–17.0)
MCH: 33.1 pg (ref 26.0–34.0)
MCH: 32.7 pg (ref 26.0–34.0)
MCHC: 36.5 g/dL — ABNORMAL HIGH (ref 30.0–36.0)
MCHC: 34.6 g/dL (ref 30.0–36.0)
MCV: 90.8 fL (ref 80.0–100.0)
MCV: 94.4 fL (ref 80.0–100.0)
Platelets: 70 10*3/uL — ABNORMAL LOW (ref 150–400)
Platelets: 82 10*3/uL — ABNORMAL LOW (ref 150–400)
RBC: 2.93 MIL/uL — ABNORMAL LOW (ref 4.22–5.81)
RBC: 3.06 MIL/uL — ABNORMAL LOW (ref 4.22–5.81)
RDW: 14.3 % (ref 11.5–15.5)
RDW: 14.8 % (ref 11.5–15.5)
WBC: 5.3 10*3/uL (ref 4.0–10.5)
WBC: 5.2 10*3/uL (ref 4.0–10.5)
nRBC: 0 % (ref 0.0–0.2)
nRBC: 0 % (ref 0.0–0.2)

## 2019-07-11 LAB — HEPATIC FUNCTION PANEL
ALT: 47 U/L — ABNORMAL HIGH (ref 0–44)
AST: 189 U/L — ABNORMAL HIGH (ref 15–41)
Albumin: 3.3 g/dL — ABNORMAL LOW (ref 3.5–5.0)
Alkaline Phosphatase: 134 U/L — ABNORMAL HIGH (ref 38–126)
Bilirubin, Direct: 1.9 mg/dL — ABNORMAL HIGH (ref 0.0–0.2)
Indirect Bilirubin: 1.7 mg/dL — ABNORMAL HIGH (ref 0.3–0.9)
Total Bilirubin: 3.6 mg/dL — ABNORMAL HIGH (ref 0.3–1.2)
Total Protein: 7.1 g/dL (ref 6.5–8.1)

## 2019-07-11 LAB — COMPREHENSIVE METABOLIC PANEL
ALT: 45 U/L — ABNORMAL HIGH (ref 0–44)
AST: 172 U/L — ABNORMAL HIGH (ref 15–41)
Albumin: 3.2 g/dL — ABNORMAL LOW (ref 3.5–5.0)
Alkaline Phosphatase: 127 U/L — ABNORMAL HIGH (ref 38–126)
Anion gap: 12 (ref 5–15)
BUN: <5 mg/dL — ABNORMAL LOW (ref 6–20)
CO2: 25 mmol/L (ref 22–32)
Calcium: 8.4 mg/dL — ABNORMAL LOW (ref 8.9–10.3)
Chloride: 99 mmol/L (ref 98–111)
Creatinine, Ser: 0.6 mg/dL — ABNORMAL LOW (ref 0.61–1.24)
GFR calc Af Amer: >60 mL/min (ref >60)
GFR calc non Af Amer: >60 mL/min (ref >60)
Glucose, Bld: 134 mg/dL — ABNORMAL HIGH (ref 70–99)
Potassium: 3.4 mmol/L — ABNORMAL LOW (ref 3.5–5.1)
Sodium: 136 mmol/L (ref 135–145)
Total Bilirubin: 4.1 mg/dL — ABNORMAL HIGH (ref 0.3–1.2)
Total Protein: 7.1 g/dL (ref 6.5–8.1)

## 2019-07-11 LAB — BASIC METABOLIC PANEL
Anion gap: 13 (ref 5–15)
BUN: <5 mg/dL — ABNORMAL LOW (ref 6–20)
CO2: 26 mmol/L (ref 22–32)
Calcium: 8.5 mg/dL — ABNORMAL LOW (ref 8.9–10.3)
Chloride: 95 mmol/L — ABNORMAL LOW (ref 98–111)
Creatinine, Ser: 0.62 mg/dL (ref 0.61–1.24)
GFR calc Af Amer: >60 mL/min (ref >60)
GFR calc non Af Amer: >60 mL/min (ref >60)
Glucose, Bld: 133 mg/dL — ABNORMAL HIGH (ref 70–99)
Potassium: 3.2 mmol/L — ABNORMAL LOW (ref 3.5–5.1)
Sodium: 134 mmol/L — ABNORMAL LOW (ref 135–145)

## 2019-07-11 LAB — URINALYSIS, COMPLETE (UACMP) WITH MICROSCOPIC
Bacteria, UA: NONE SEEN
Glucose, UA: NEGATIVE mg/dL
Hgb urine dipstick: NEGATIVE
Ketones, ur: NEGATIVE mg/dL
Leukocytes,Ua: NEGATIVE
Nitrite: NEGATIVE
Protein, ur: 30 mg/dL — AB
Specific Gravity, Urine: 1.015 (ref 1.005–1.030)
pH: 7 (ref 5.0–8.0)

## 2019-07-11 LAB — MAGNESIUM: Magnesium: 1.4 mg/dL — ABNORMAL LOW (ref 1.7–2.4)

## 2019-07-11 LAB — TROPONIN I (HIGH SENSITIVITY)
Troponin I (High Sensitivity): 6 ng/L (ref <18)
Troponin I (High Sensitivity): 6 ng/L (ref <18)

## 2019-07-11 LAB — TYPE AND SCREEN
ABO/RH(D): O NEG
Antibody Screen: NEGATIVE

## 2019-07-11 LAB — PROTIME-INR
INR: 1.2 (ref 0.8–1.2)
Prothrombin Time: 15.5 seconds — ABNORMAL HIGH (ref 11.4–15.2)

## 2019-07-11 LAB — ETHANOL: Alcohol, Ethyl (B): <10 mg/dL (ref <10)

## 2019-07-11 LAB — PHOSPHORUS: Phosphorus: 1.6 mg/dL — ABNORMAL LOW (ref 2.5–4.6)

## 2019-07-11 LAB — SARS CORONAVIRUS 2 (TAT 6-24 HRS): SARS Coronavirus 2: NEGATIVE

## 2019-07-11 LAB — LIPASE, BLOOD: Lipase: 35 U/L (ref 11–51)

## 2019-07-11 LAB — AMMONIA: Ammonia: 51 umol/L — ABNORMAL HIGH (ref 9–35)

## 2019-07-11 MED ORDER — ONDANSETRON HCL 4 MG/2ML IJ SOLN
4.0000 mg | Freq: Three times a day (TID) | INTRAMUSCULAR | Status: DC | PRN
  Administered 2019-07-11 – 2019-07-16 (×5): 4 mg via INTRAVENOUS
  Filled 2019-07-11 (×5): qty 2

## 2019-07-11 MED ORDER — GABAPENTIN 300 MG PO CAPS
600.0000 mg | ORAL_CAPSULE | Freq: Three times a day (TID) | ORAL | Status: DC
  Administered 2019-07-11 – 2019-07-17 (×12): 600 mg via ORAL
  Filled 2019-07-11 (×12): qty 2

## 2019-07-11 MED ORDER — FOLIC ACID 1 MG PO TABS
1.0000 mg | ORAL_TABLET | Freq: Every day | ORAL | Status: DC
  Administered 2019-07-11 – 2019-07-17 (×6): 1 mg via ORAL
  Filled 2019-07-11 (×6): qty 1

## 2019-07-11 MED ORDER — THIAMINE HCL 100 MG PO TABS
100.0000 mg | ORAL_TABLET | Freq: Every day | ORAL | Status: DC
  Administered 2019-07-11 – 2019-07-17 (×5): 100 mg via ORAL
  Filled 2019-07-11 (×5): qty 1

## 2019-07-11 MED ORDER — MORPHINE SULFATE (PF) 2 MG/ML IV SOLN
1.0000 mg | INTRAVENOUS | Status: DC | PRN
  Administered 2019-07-11: 1 mg via INTRAVENOUS
  Filled 2019-07-11: qty 1

## 2019-07-11 MED ORDER — ADULT MULTIVITAMIN W/MINERALS CH
1.0000 | ORAL_TABLET | Freq: Every day | ORAL | Status: DC
  Administered 2019-07-11 – 2019-07-17 (×6): 1 via ORAL
  Filled 2019-07-11 (×6): qty 1

## 2019-07-11 MED ORDER — THIAMINE HCL 100 MG/ML IJ SOLN
100.0000 mg | Freq: Every day | INTRAMUSCULAR | Status: DC
  Administered 2019-07-14: 100 mg via INTRAVENOUS
  Filled 2019-07-11: qty 2

## 2019-07-11 MED ORDER — ONDANSETRON HCL 4 MG/2ML IJ SOLN
4.0000 mg | Freq: Once | INTRAMUSCULAR | Status: AC
  Administered 2019-07-11: 4 mg via INTRAVENOUS
  Filled 2019-07-11: qty 2

## 2019-07-11 MED ORDER — FLUOXETINE HCL 20 MG PO CAPS
40.0000 mg | ORAL_CAPSULE | Freq: Every day | ORAL | Status: DC
  Administered 2019-07-12 – 2019-07-17 (×6): 40 mg via ORAL
  Filled 2019-07-11 (×6): qty 2

## 2019-07-11 MED ORDER — PANTOPRAZOLE SODIUM 40 MG IV SOLR
40.0000 mg | Freq: Two times a day (BID) | INTRAVENOUS | Status: DC
  Administered 2019-07-11 – 2019-07-15 (×8): 40 mg via INTRAVENOUS
  Filled 2019-07-11 (×8): qty 40

## 2019-07-11 MED ORDER — SODIUM CHLORIDE 0.9 % IV BOLUS
500.0000 mL | Freq: Once | INTRAVENOUS | Status: AC
  Administered 2019-07-11: 500 mL via INTRAVENOUS

## 2019-07-11 MED ORDER — LORAZEPAM 2 MG/ML IJ SOLN
1.0000 mg | INTRAMUSCULAR | Status: AC | PRN
  Administered 2019-07-12: 1 mg via INTRAVENOUS
  Administered 2019-07-12 (×2): 2 mg via INTRAVENOUS
  Administered 2019-07-13 (×2): 4 mg via INTRAVENOUS
  Administered 2019-07-13: 2 mg via INTRAVENOUS
  Filled 2019-07-11: qty 1
  Filled 2019-07-11 (×2): qty 2
  Filled 2019-07-11: qty 1

## 2019-07-11 MED ORDER — LORAZEPAM 2 MG/ML IJ SOLN
0.0000 mg | Freq: Two times a day (BID) | INTRAMUSCULAR | Status: AC
  Administered 2019-07-14: 2 mg via INTRAVENOUS
  Filled 2019-07-11: qty 1

## 2019-07-11 MED ORDER — BUSPIRONE HCL 15 MG PO TABS
15.0000 mg | ORAL_TABLET | Freq: Two times a day (BID) | ORAL | Status: DC
  Administered 2019-07-12 – 2019-07-17 (×11): 15 mg via ORAL
  Filled 2019-07-11 (×13): qty 1

## 2019-07-11 MED ORDER — METRONIDAZOLE IN NACL 5-0.79 MG/ML-% IV SOLN: 500.0000 mg | Freq: Once | INTRAVENOUS | Status: DC

## 2019-07-11 MED ORDER — IOHEXOL 300 MG/ML  SOLN
100.0000 mL | Freq: Once | INTRAMUSCULAR | Status: AC | PRN
  Administered 2019-07-11: 100 mL via INTRAVENOUS

## 2019-07-11 MED ORDER — CIPROFLOXACIN IN D5W 400 MG/200ML IV SOLN
400.0000 mg | Freq: Two times a day (BID) | INTRAVENOUS | Status: DC
  Administered 2019-07-12 – 2019-07-16 (×11): 400 mg via INTRAVENOUS
  Filled 2019-07-11 (×13): qty 200

## 2019-07-11 MED ORDER — PANTOPRAZOLE SODIUM 40 MG IV SOLR
40.0000 mg | Freq: Once | INTRAVENOUS | Status: AC
  Administered 2019-07-11: 40 mg via INTRAVENOUS
  Filled 2019-07-11: qty 40

## 2019-07-11 MED ORDER — MORPHINE SULFATE (PF) 4 MG/ML IV SOLN
4.0000 mg | INTRAVENOUS | Status: DC | PRN
  Administered 2019-07-11: 4 mg via INTRAVENOUS
  Filled 2019-07-11: qty 1

## 2019-07-11 MED ORDER — METRONIDAZOLE IN NACL 5-0.79 MG/ML-% IV SOLN
500.0000 mg | Freq: Three times a day (TID) | INTRAVENOUS | Status: AC
  Administered 2019-07-11 – 2019-07-17 (×18): 500 mg via INTRAVENOUS
  Filled 2019-07-11 (×19): qty 100

## 2019-07-11 MED ORDER — LORAZEPAM 1 MG PO TABS
1.0000 mg | ORAL_TABLET | ORAL | Status: AC | PRN
  Administered 2019-07-12: 1 mg via ORAL
  Filled 2019-07-11: qty 1

## 2019-07-11 MED ORDER — PRAZOSIN HCL 1 MG PO CAPS
1.0000 mg | ORAL_CAPSULE | Freq: Every day | ORAL | Status: DC
  Administered 2019-07-12 (×2): 1 mg via ORAL
  Filled 2019-07-11 (×5): qty 1

## 2019-07-11 MED ORDER — TRAZODONE HCL 100 MG PO TABS: 100.0000 mg | ORAL_TABLET | Freq: Every evening | ORAL | Status: DC | PRN

## 2019-07-11 MED ORDER — LEVOTHYROXINE SODIUM 25 MCG PO TABS
125.0000 ug | ORAL_TABLET | Freq: Every day | ORAL | Status: DC
  Administered 2019-07-15 – 2019-07-17 (×3): 125 ug via ORAL
  Filled 2019-07-11 (×3): qty 1

## 2019-07-11 MED ORDER — POTASSIUM CHLORIDE CRYS ER 20 MEQ PO TBCR
40.0000 meq | EXTENDED_RELEASE_TABLET | Freq: Once | ORAL | Status: AC
  Administered 2019-07-11: 40 meq via ORAL
  Filled 2019-07-11: qty 2

## 2019-07-11 MED ORDER — LORAZEPAM 2 MG/ML IJ SOLN
0.0000 mg | Freq: Four times a day (QID) | INTRAMUSCULAR | Status: AC
  Administered 2019-07-12: 1 mg via INTRAVENOUS
  Filled 2019-07-11 (×3): qty 1

## 2019-07-11 MED ORDER — CIPROFLOXACIN IN D5W 400 MG/200ML IV SOLN
400.0000 mg | Freq: Once | INTRAVENOUS | Status: AC
  Administered 2019-07-11: 400 mg via INTRAVENOUS
  Filled 2019-07-11: qty 200

## 2019-07-11 MED ORDER — HYDROXYZINE HCL 25 MG PO TABS
25.0000 mg | ORAL_TABLET | Freq: Three times a day (TID) | ORAL | Status: DC | PRN
  Administered 2019-07-15 – 2019-07-16 (×2): 25 mg via ORAL
  Filled 2019-07-11 (×3): qty 1

## 2019-07-11 MED ORDER — SODIUM CHLORIDE 0.9 % IV SOLN: INTRAVENOUS | Status: DC

## 2019-07-11 NOTE — ED Notes (Signed)
EDP at bedside  

## 2019-07-11 NOTE — ED Notes (Signed)
Report from Anda Kraft, [place] at this time.
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
## 2019-07-11 NOTE — Consult Note (Signed)
Ivan Hamilton , MD [address], [place], [place], [place], [address] [address], [place], [place], [place], [address] Phone: [phone]  Fax: [phone]  Consultation  Referring Provider:     No ref. provider found Primary Care Physician:  Alexis Goodell, MD Primary Gastroenterologist:  None        Reason for Consultation:    Anemia   Date of Admission:  07/11/2019 Date of Consultation:  07/11/2019         HPI:   Ivan Hamilton is a 53 y.o. male came to the hospital for abdominal pain.  He states that he had a fall earlier today since then he had a bit of pain in the upper part of his abdomen.  Difficult historian.  He is also mentions that he had hisLast drink on Thursday, drinks 1/2 pint of tequila daily for many years, no illegal drugs. Denies any hematemesis, stool has been red/black last week . No bowel movements today . Brown stool yesterday . No NSAID's.  CT abdomen shows some features of diverticulitis . Features of portal HTN.  Denies any hematemesis.  Denies any NSAID use.   Past Medical History:  Diagnosis Date  . Alcohol withdrawal ([place])   . B12 deficiency   . Chronic pain   . CVA (cerebral vascular accident) ([place])   . Hypertension   . Hypothyroidism   . Varicose veins     Past Surgical History:  Procedure Laterality Date  . CHOLECYSTECTOMY    . ESOPHAGOGASTRODUODENOSCOPY (EGD) WITH PROPOFOL N/A 10/24/2017   Procedure: ESOPHAGOGASTRODUODENOSCOPY (EGD) WITH PROPOFOL;  Surgeon: [name], MD;  Location: [facility] ENDOSCOPY;  Service: Endoscopy;  Laterality: N/A;  . LEG SURGERY      Prior to Admission medications   Medication Sig Start Date End Date Taking? Authorizing Provider  busPIRone (BUSPAR) 15 MG tablet Take 1 tablet (15 mg total) by mouth 2 (two) times daily. 09/24/18  Yes Lavella Hammock, MD  FLUoxetine (PROZAC) 40 MG capsule Take 1 capsule (40 mg total) by mouth daily. 09/24/18  Yes Lavella Hammock, MD  gabapentin (NEURONTIN) 300 MG capsule  Take 2 capsules (600 mg total) by mouth 3 (three) times daily. 09/24/18  Yes Lavella Hammock, MD  hydrOXYzine (ATARAX/VISTARIL) 25 MG tablet Take 25 mg by mouth every 8 (eight) hours as needed for anxiety. 01/28/19  Yes [provider]  levothyroxine (SYNTHROID, LEVOTHROID) 125 MCG tablet Take 1 tablet (125 mcg total) by mouth daily before breakfast. 09/24/18  Yes Lavella Hammock, MD  pantoprazole (PROTONIX) 40 MG tablet Take 40 mg by mouth 2 (two) times daily. 01/28/19  Yes [provider]  prazosin (MINIPRESS) 1 MG capsule Take 1 capsule (1 mg total) by mouth at bedtime. 09/24/18  Yes Lavella Hammock, MD  propranolol (INDERAL) 10 MG tablet Take 1 tablet (10 mg total) by mouth 2 (two) times daily as needed. 09/24/18  Yes Lavella Hammock, MD  traZODone (DESYREL) 50 MG tablet Take 100 mg by mouth at bedtime as needed for sleep. 06/01/19  Yes [provider]    History reviewed. No pertinent family history.   Social History   Tobacco Use  . Smoking status: Never Smoker  . Smokeless tobacco: Never Used  Substance Use Topics  . Alcohol use: Yes    Comment: daily- 1 pint of vodka  . Drug use: No    Allergies as of 07/11/2019  . (No Known Allergies)    Review of Systems:  All systems reviewed and negative except where noted in HPI.   Physical Exam:  Vital signs in last 24 hours: Temp:  [99.2 F (37.3 C)] 99.2 F (37.3 C) (12/19 0340) Pulse Rate:  [71-111] 82 (12/19 1130) Resp:  [16-18] 16 (12/19 0858) BP: (105-126)/(77-96) 111/96 (12/19 1130) SpO2:  [95 %-98 %] 97 % (12/19 1130) Weight:  [62.1 kg] 62.1 kg (12/19 0339)   General:   Pleasant, cooperative in NAD Head:  Normocephalic and atraumatic. Eyes:   No icterus.   Conjunctiva pink. PERRLA. Ears:  Normal auditory acuity. Neck:  Supple; no masses or thyroidomegaly Lungs: Respirations even and unlabored. Lungs clear to auscultation bilaterally.   No wheezes, crackles, or rhonchi.  Heart:  Regular rate and  rhythm;  Without murmur, clicks, rubs or gallops Abdomen:  Soft, nondistended, nontender. Normal bowel sounds. No appreciable masses or hepatomegaly.  No rebound or guarding.  Neurologic:  Alert and oriented x3;  grossly normal neurologically. Skin:  Intact without significant lesions or rashes. Cervical Nodes:  No significant cervical adenopathy. Psych:  Alert and cooperative. Normal affect.  LAB RESULTS: Recent Labs    07/11/19 0352  WBC 5.3  HGB 9.7*  HCT 26.6*  PLT 70*   BMET Recent Labs    07/11/19 0352  NA 134*  K 3.2*  CL 95*  CO2 26  GLUCOSE 133*  BUN <5*  CREATININE 0.62  CALCIUM 8.5*   LFT Recent Labs    07/11/19 0352  PROT 7.1  ALBUMIN 3.3*  AST 189*  ALT 47*  ALKPHOS 134*  BILITOT 3.6*  BILIDIR 1.9*  IBILI 1.7*   PT/INR Recent Labs    07/11/19 1056  LABPROT 15.5*  INR 1.2    STUDIES: CT ABDOMEN PELVIS W CONTRAST  Result Date: 07/11/2019 CLINICAL DATA:  Abdominal abscess/infection suspected with right lower quadrant pain, pain under ribs. EXAM: CT ABDOMEN AND PELVIS WITH CONTRAST TECHNIQUE: Multidetector CT imaging of the abdomen and pelvis was performed using the standard protocol following bolus administration of intravenous contrast. CONTRAST:  142mL OMNIPAQUE IOHEXOL 300 MG/ML  SOLN COMPARISON:  06/16/2019 FINDINGS: Lower chest: Signs of basilar atelectasis. No signs of consolidation or of pleural effusion. Hepatobiliary: Marked hepatic steatosis with lobular hepatic contours not changed from previous exam. Enhancement pattern suggests underlying fibrosis with heterogeneous lace-like appearance. No signs of biliary ductal distension following cholecystectomy. Pancreas: Pancreas is normal without focal lesion or ductal dilation. No peripancreatic inflammation. Spleen: Spleen with signs of mild enlargement, approximately 12 cm in greatest craniocaudal dimension but with perisplenic and perigastric collaterals in the setting of presumed liver disease.  Adrenals/Urinary Tract: Adrenal glands are normal. Symmetric enhancement is noted of the bilateral kidneys without signs of hydronephrosis. Urinary bladder is normal. Stomach/Bowel: No signs of small bowel obstruction or acute gastric process. The appendix is seen in the right lower quadrant and is normal. There is generalized edema about the bowel in this patient with signs of portal hypertension. Smiley asymmetric stranding noted about the descending colon. Marked colonic diverticulosis in the sigmoid colon. Vascular/Lymphatic: Patent portal vein. Signs of portosystemic collaterals in the upper abdomen about the stomach and spleen. Small left splenorenal shunt. Signs of calcific atherosclerotic changes, minimal in in the abdominal aorta. No signs of abdominal lymphadenopathy or upper abdominal lymphadenopathy. No signs of pelvic lymphadenopathy. Reproductive: Prostate grossly normal by CT. Other: No abdominal wall hernia or abnormality. No abdominopelvic ascites. Musculoskeletal: No sign of acute bone finding or destructive bone process. IMPRESSION: 1. Smiley asymmetric stranding about  the descending colon, which may represent a focal area of diverticulitis, also some mild thickening of the ascending colon which is generalized could be related to early portal colopathy. 2. Signs of liver disease with evidence of portosystemic collaterals in the upper abdomen. Constellation of findings on today's study including some increase in generalized and bowel edema may reflect early hepatic decompensation of existing liver disease. Clinical correlation is suggested. 3. Splenomegaly with portosystemic collaterals in the upper abdomen, consistent with portal hypertension. 4. Aortic atherosclerosis. Aortic Atherosclerosis (ICD10-I70.0). Electronically Signed   By: Zetta Bills M.D.   On: 07/11/2019 10:30   DG Chest Portable 1 View  Result Date: 07/11/2019 CLINICAL DATA:  Near syncope, nausea, dizziness and chest  tightness. EXAM: PORTABLE CHEST 1 VIEW COMPARISON:  08/11/2014 FINDINGS: The heart size and mediastinal contours are within normal limits. There is no evidence of pulmonary edema, consolidation, pneumothorax, nodule or pleural fluid. The visualized skeletal structures are unremarkable. IMPRESSION: No active disease. Electronically Signed   By: Aletta Edouard M.D.   On: 07/11/2019 09:38      Impression / Plan:   Ivan Hamilton is a 53 y.o. y/o male with history of excess alcohol consumption for many years presents to the emergency room with abdominal pain.  CT scan of the abdomen shows features of diverticulitis.  Also noted a drop in hemoglobin since his last one a few weeks back.  He does mention having black tarry stools a week earlier.  None over the last few days.  He does have features of portal hypertension on CT scan of the abdomen.  Plan 1.  Suggest IV octreotide 2.  IV antibiotics for diverticulitis which will also cover for SBP prophylaxis in the setting of a GI bleed in a cirrhotic patient. 3.  EGD tomorrow 4.  Clear liquids okay today n.p.o. from midnight 5.  Stressed importance to stop all alcohol consumption  I have discussed alternative options, risks & benefits,  which include, but are not limited to, bleeding, infection, perforation,respiratory complication & drug reaction.  The patient agrees with this plan & written consent will be obtained.     Thank you for involving me in the care of this patient.      LOS: 0 days   Ivan Bellows, MD  07/11/2019, 12:51 PM

## 2019-07-11 NOTE — ED Notes (Signed)
Blood cultures ordered after antibiotics ordered- will draw cultures now.

## 2019-07-11 NOTE — ED Notes (Signed)
covid swab walked to lab

## 2019-07-11 NOTE — ED Notes (Signed)
Pt c/o of central upper abd pain and "pain under ribs." pt also c/o HA and dizziness yesterday at home. A&O, no distress noted. Speaking in complete sentences. Stated abd pain began first.

## 2019-07-11 NOTE — ED Triage Notes (Signed)
Patient to the ED via EMS after passing out.  Patient reports feeling nauseated, dizzy, abdominal pain and having chest tightness.

## 2019-07-11 NOTE — H&P (Addendum)
History and Physical    Ivan Hamilton [id number] DOB: 01/27/1966 DOA: 07/11/2019  Referring MD/NP/PA:   PCP: Alexis Goodell, MD   Patient coming from:  The patient is coming from home.  At baseline, pt is independent for most of ADL.        Chief Complaint: syncope, abdominal pain  HPI: Ivan Hamilton is a 53 y.o. male with medical history significant of stroke, hypothyroidism, depression, alcoholism, PTSD, hypertension, who presents with abdominal pain, syncope.  Patient states that he has been having abdominal pain the past several days, which has worsened today.  Abdominal pain is located in the right side of abdomen, RUQ and epigastric area, constant, moderate, dull, nonradiating.  Associated with nausea, but no vomiting or diarrhea.  Denies fever or chills.  Patient has generalized weakness, but denies unilateral numbness or tingling his extremities.  No facial droop or slurred speech. He states that he passed out for a few seconds at home.  No significant injury.  Patient states that he has had some chest tightness earlier, which has resolved.  Currently no chest pain, chest tightness, cough.  He has some mild shortness of breath.  No symptoms of UTI. He states that he had black stool last week. Last drink on Thursday, drinks 1/2 pint of tequila daily for many years, no illegal drugs.  ED Course: pt was found to have WBC 5.3, INR 1.2, troponin VI, 6, negative urinalysis, lipase 35, liver function (ALP 134, AST 189, ALT 47, total bilirubin 36, direct bilirubin 1.9), potassium 3.2, renal function normal, temperature 99.2, blood pressure 116/84, tachycardia, oxygen saturation 96% on room air.  Chest x-ray negative. CT abdomen shows some features of diverticulitis. Pt is admitted to med-surg bed as inpt. GI, Dr. Vicente Males was consulted.  # CT-abdomen/pelvis: 1. Smiley asymmetric stranding about the descending colon, which may represent a focal area of diverticulitis, also some mild  thickening of the ascending colon which is generalized could be related to early portal colopathy. 2. Signs of liver disease with evidence of portosystemic collaterals in the upper abdomen. Constellation of findings on today's study including some increase in generalized and bowel edema may reflect early hepatic decompensation of existing liver disease. Clinical correlation is suggested. 3. Splenomegaly with portosystemic collaterals in the upper abdomen, consistent with portal hypertension. 4. Aortic atherosclerosis.  Review of Systems:   General: no fevers, chills, no body weight gain, has poor appetite, has fatigue HEENT: no blurry vision, hearing changes or sore throat Respiratory: has mild dyspnea, no coughing, wheezing CV: no chest pain, no palpitations GI: has nausea, abdominal pain, no vomiting, diarrhea, constipation GU: no dysuria, burning on urination, increased urinary frequency, hematuria  Ext: no leg edema Neuro: no unilateral weakness, numbness, or tingling, no vision change or hearing loss Skin: no rash, no skin tear. MSK: No muscle spasm, no deformity, no limitation of range of movement in spin Heme: No easy bruising.  Travel history: No recent long distant travel.  Allergy: No Known Allergies  Past Medical History:  Diagnosis Date  . Alcohol withdrawal ([place])   . B12 deficiency   . Chronic pain   . CVA (cerebral vascular accident) ([place])   . Hypertension   . Hypothyroidism   . Varicose veins     Past Surgical History:  Procedure Laterality Date  . CHOLECYSTECTOMY    . ESOPHAGOGASTRODUODENOSCOPY (EGD) WITH PROPOFOL N/A 10/24/2017   Procedure: ESOPHAGOGASTRODUODENOSCOPY (EGD) WITH PROPOFOL;  Surgeon: [name], MD;  Location: [facility] ENDOSCOPY;  Service: Endoscopy;  Laterality: N/A;  . LEG SURGERY      Social History:  reports that he has never smoked. He has never used smokeless tobacco. He reports current alcohol use. He reports that he does not use  drugs.  Family History: History reviewed. No pertinent family history.  Reviewed with patient, patient states that his father accidentally died, otherwise no significant family medical history.  Prior to Admission medications   Medication Sig Start Date End Date Taking? Authorizing Provider  busPIRone (BUSPAR) 15 MG tablet Take 1 tablet (15 mg total) by mouth 2 (two) times daily. 09/24/18   Lavella Hammock, MD  FLUoxetine (PROZAC) 40 MG capsule Take 1 capsule (40 mg total) by mouth daily. 09/24/18   Lavella Hammock, MD  gabapentin (NEURONTIN) 300 MG capsule Take 2 capsules (600 mg total) by mouth 3 (three) times daily. 09/24/18   Lavella Hammock, MD  levothyroxine (SYNTHROID, LEVOTHROID) 125 MCG tablet Take 1 tablet (125 mcg total) by mouth daily before breakfast. 09/24/18   Lavella Hammock, MD  naltrexone (DEPADE) 50 MG tablet Take 1 tablet (50 mg total) by mouth daily. 09/24/18   Lavella Hammock, MD  prazosin (MINIPRESS) 1 MG capsule Take 1 capsule (1 mg total) by mouth at bedtime. 09/24/18   Lavella Hammock, MD  propranolol (INDERAL) 10 MG tablet Take 1 tablet (10 mg total) by mouth 2 (two) times daily as needed. 09/24/18   Lavella Hammock, MD  QUEtiapine (SEROQUEL XR) 200 MG 24 hr tablet Take 1 tablet (200 mg total) by mouth at bedtime. 09/24/18   Lavella Hammock, MD    Physical Exam: Vitals:   07/11/19 1400 07/11/19 1500 07/11/19 1600 07/11/19 1713  BP: 121/78  110/79 140/83  Pulse: 76 75 75 77  Resp:    14  Temp:      TempSrc:      SpO2: 97% 97% 96% 100%  Weight:      Height:       General: Not in acute distress HEENT:       Eyes: PERRL, EOMI, no scleral icterus.       ENT: No discharge from the ears and nose, no pharynx injection, no tonsillar enlargement.        Neck: No JVD, no bruit, no mass felt. Heme: No neck lymph node enlargement. Cardiac: S1/S2, RRR, No murmurs, No gallops or rubs. Respiratory: no rales, wheezing, rhonchi or rubs. GI: Soft, nondistended, has tenderness in  right side, RUQ and epigastric area, no rebound pain, no organomegaly, BS present. GU: No hematuria Ext: No pitting leg edema bilaterally. 2+DP/PT pulse bilaterally. Musculoskeletal: No joint deformities, No joint redness or warmth, no limitation of ROM in spin. Skin: No rashes.  Neuro: Alert, oriented X3, cranial nerves II-XII grossly intact, moves all extremities normally. Muscle strength 5/5 in all extremities, sensation to light touch intact. Brachial reflex 2+ bilaterally.  Psych: Patient is not psychotic, no suicidal or hemocidal ideation.  Labs on Admission: I have personally reviewed following labs and imaging studies  CBC: Recent Labs  Lab 07/11/19 0352  WBC 5.3  HGB 9.7*  HCT 26.6*  MCV 90.8  PLT 70*   Basic Metabolic Panel: Recent Labs  Lab 07/11/19 0352  NA 134*  K 3.2*  CL 95*  CO2 26  GLUCOSE 133*  BUN <5*  CREATININE 0.62  CALCIUM 8.5*   GFR: Estimated Creatinine Clearance: 90.8 mL/min (by C-G formula based on SCr of 0.62 mg/dL). Liver Function Tests: Recent  Labs  Lab 07/11/19 0352  AST 189*  ALT 47*  ALKPHOS 134*  BILITOT 3.6*  PROT 7.1  ALBUMIN 3.3*   Recent Labs  Lab 07/11/19 0352  LIPASE 35   No results for input(s): AMMONIA in the last 168 hours. Coagulation Profile: Recent Labs  Lab 07/11/19 1056  INR 1.2   Cardiac Enzymes: No results for input(s): CKTOTAL, CKMB, CKMBINDEX, TROPONINI in the last 168 hours. BNP (last 3 results) No results for input(s): PROBNP in the last 8760 hours. HbA1C: No results for input(s): HGBA1C in the last 72 hours. CBG: No results for input(s): GLUCAP in the last 168 hours. Lipid Profile: No results for input(s): CHOL, HDL, LDLCALC, TRIG, CHOLHDL, LDLDIRECT in the last 72 hours. Thyroid Function Tests: No results for input(s): TSH, T4TOTAL, FREET4, T3FREE, THYROIDAB in the last 72 hours. Anemia Panel: No results for input(s): VITAMINB12, FOLATE, FERRITIN, TIBC, IRON, RETICCTPCT in the last 72  hours. Urine analysis:    Component Value Date/Time   COLORURINE AMBER (A) 07/11/2019 0352   APPEARANCEUR CLEAR (A) 07/11/2019 0352   APPEARANCEUR Cloudy 08/11/2014 1030   LABSPEC 1.015 07/11/2019 0352   LABSPEC 1.027 08/11/2014 1030   PHURINE 7.0 07/11/2019 0352   GLUCOSEU NEGATIVE 07/11/2019 0352   GLUCOSEU >=500 08/11/2014 1030   HGBUR NEGATIVE 07/11/2019 0352   BILIRUBINUR SMALL (A) 07/11/2019 0352   BILIRUBINUR 1+ 08/11/2014 1030   KETONESUR NEGATIVE 07/11/2019 0352   PROTEINUR 30 (A) 07/11/2019 0352   NITRITE NEGATIVE 07/11/2019 0352   LEUKOCYTESUR NEGATIVE 07/11/2019 0352   LEUKOCYTESUR Negative 08/11/2014 1030   Sepsis Labs: @LABRCNTIP (procalcitonin:4,lacticidven:4) )No results found for this or any previous visit (from the past 240 hour(s)).   Radiological Exams on Admission: CT ABDOMEN PELVIS W CONTRAST  Result Date: 07/11/2019 CLINICAL DATA:  Abdominal abscess/infection suspected with right lower quadrant pain, pain under ribs. EXAM: CT ABDOMEN AND PELVIS WITH CONTRAST TECHNIQUE: Multidetector CT imaging of the abdomen and pelvis was performed using the standard protocol following bolus administration of intravenous contrast. CONTRAST:  190mL OMNIPAQUE IOHEXOL 300 MG/ML  SOLN COMPARISON:  06/16/2019 FINDINGS: Lower chest: Signs of basilar atelectasis. No signs of consolidation or of pleural effusion. Hepatobiliary: Marked hepatic steatosis with lobular hepatic contours not changed from previous exam. Enhancement pattern suggests underlying fibrosis with heterogeneous lace-like appearance. No signs of biliary ductal distension following cholecystectomy. Pancreas: Pancreas is normal without focal lesion or ductal dilation. No peripancreatic inflammation. Spleen: Spleen with signs of mild enlargement, approximately 12 cm in greatest craniocaudal dimension but with perisplenic and perigastric collaterals in the setting of presumed liver disease. Adrenals/Urinary Tract: Adrenal  glands are normal. Symmetric enhancement is noted of the bilateral kidneys without signs of hydronephrosis. Urinary bladder is normal. Stomach/Bowel: No signs of small bowel obstruction or acute gastric process. The appendix is seen in the right lower quadrant and is normal. There is generalized edema about the bowel in this patient with signs of portal hypertension. Smiley asymmetric stranding noted about the descending colon. Marked colonic diverticulosis in the sigmoid colon. Vascular/Lymphatic: Patent portal vein. Signs of portosystemic collaterals in the upper abdomen about the stomach and spleen. Small left splenorenal shunt. Signs of calcific atherosclerotic changes, minimal in in the abdominal aorta. No signs of abdominal lymphadenopathy or upper abdominal lymphadenopathy. No signs of pelvic lymphadenopathy. Reproductive: Prostate grossly normal by CT. Other: No abdominal wall hernia or abnormality. No abdominopelvic ascites. Musculoskeletal: No sign of acute bone finding or destructive bone process. IMPRESSION: 1. Smiley asymmetric stranding about the descending  colon, which may represent a focal area of diverticulitis, also some mild thickening of the ascending colon which is generalized could be related to early portal colopathy. 2. Signs of liver disease with evidence of portosystemic collaterals in the upper abdomen. Constellation of findings on today's study including some increase in generalized and bowel edema may reflect early hepatic decompensation of existing liver disease. Clinical correlation is suggested. 3. Splenomegaly with portosystemic collaterals in the upper abdomen, consistent with portal hypertension. 4. Aortic atherosclerosis. Aortic Atherosclerosis (ICD10-I70.0). Electronically Signed   By: Zetta Bills M.D.   On: 07/11/2019 10:30   DG Chest Portable 1 View  Result Date: 07/11/2019 CLINICAL DATA:  Near syncope, nausea, dizziness and chest tightness. EXAM: PORTABLE CHEST 1 VIEW  COMPARISON:  08/11/2014 FINDINGS: The heart size and mediastinal contours are within normal limits. There is no evidence of pulmonary edema, consolidation, pneumothorax, nodule or pleural fluid. The visualized skeletal structures are unremarkable. IMPRESSION: No active disease. Electronically Signed   By: Aletta Edouard M.D.   On: 07/11/2019 09:38     EKG: Independently reviewed.  Sinus rhythm, QTC 477, LAE, nonspecific T wave change.  Assessment/Plan Principal Problem:   Abdominal pain Active Problems:   Hypothyroid   Gastric reflux   Hypokalemia   Depression   Syncope   Alcohol abuse   Thrombocytopenia (HCC)   Normocytic anemia   Tobacco abuse   CVA (cerebral vascular accident) ([place])   HTN (hypertension)   Abdominal pain: Etiology is not clear.  CT abdomen shows some features of diverticulitis. -Admitted to MedSurg bed as inpatient -started Cipro and Flagyl -Blood culture -As needed morphine for pain, Zofran for nausea -Gentle IV fluid  Gastric reflux: -on IV protonix 40 mg bid  Depression and anxiety: Stable, no suicidal or homicidal ideations. -Continue home medications  Hypothyroidism: -Continue home Synthroid  HTN:  -Continue home medications: prazosin - pt is on propranolol which is also for portal HTN   Hypokalemia: K= 3.2 on admission. - Repleted - Check Mg level  Syncope: Etiology is not clear.  Given history of stroke, we needed to rule out stroke today. -MRI of the brain -2D echo -Check orthostatic vital sign -Gentle fluid  Alcohol abuse: -CiWA protocol  Thrombocytopenia ([place]): Platelet 70, likely due to chronic liver disease.  No bleeding tendency -Follow-up with CBC  Normocytic anemia: Hgb dropped from 14.5 on 09/23/2018 to 9.7. had melena recently -Dr. Vicente Males of GI was consulted--> planning endoscopy tomorrow -N.p.o. after midnight -on IV protonix  Hx of CVA (cerebral vascular accident) [facility]): not taking med at home -f/u MRI     Inpatient status:  # Patient requires inpatient status due to high intensity of service, high risk for further deterioration and high frequency of surveillance required.  I certify that at the point of admission it is my clinical judgment that the patient will require inpatient hospital care spanning beyond 2 midnights from the point of admission.  . This patient has multiple chronic comorbidities including stroke, hypothyroidism, depression, alcoholism, PTSD, hypertension . Now patient has presenting with syncope, abdominal pain due to possible diverticulitis . The worrisome physical exam findings include abdominal tenderness . The initial radiographic and laboratory data are worrisome because of worsening anemia, possible diverticulitis per CT scan . Current medical needs: please see my assessment and plan . Predictability of an adverse outcome (risk): Patient has multiple comorbidities, now presents with syncope, abdominal pain due to possible diverticulitis, worsening anemia.  Patient's presentation is highly complicated.  Patient is  at high risk of deteriorating. Will need to be treated in hospital for at least 2 days.          DVT ppx: SCD Code Status: Full code Family Communication: None at bed side.    Disposition Plan:  Anticipate discharge back to previous home environment Consults called:  GI, Dr. Vicente Males Admission status: Med-surg bed as inpt      Date of Service 07/11/2019    [facility]   If 7PM-7AM, please contact night-coverage [email or web address] Password [facility] 07/11/2019, 5:58 PM

## 2019-07-11 NOTE — ED Notes (Signed)
Admission MD at bedside.  

## 2019-07-11 NOTE — ED Notes (Signed)
Pt c/o of slight headache and nausea. Lights dimmed per request. No further needs identified at this time.

## 2019-07-11 NOTE — ED Notes (Signed)
Second daughter and wife called at this time for update- update given to family, visitor policy explained.

## 2019-07-11 NOTE — ED Notes (Signed)
BP repeat time adjusted to Q2hr for admission status.

## 2019-07-11 NOTE — ED Notes (Signed)
Pt provided water per request. Pt states "my headache is better now." Admission process and plan of care discussed with pt. Urinal placed on bed-rail. No further needs identified at this time. Call light is in reach and bed is in lowest position. Environment is secured.

## 2019-07-11 NOTE — ED Notes (Signed)
Pt in CT scan.

## 2019-07-11 NOTE — ED Notes (Addendum)
Called to give heads up to floor at this time. Was on hold for 8 minutes. Pt holding with bed for 30 minutes, transport requested.

## 2019-07-11 NOTE — ED Provider Notes (Signed)
[facility] Emergency Department Provider Note    First MD Initiated Contact with Patient 07/11/19 [phone]     (approximate)  I have reviewed the triage vital signs and the nursing notes.   HISTORY  Chief Complaint Near Syncope    HPI Ivan Hamilton is a 53 y.o. male listed past medical history presents the ER for evaluation of generalized weakness feeling like he is about to pass out associated with nausea chest pain as well as right lower quadrant pain.  Denies any numbness or tingling.  No measured fevers.  Is not had any melena or hematochezia.  No vomiting.  No hematemesis.    Past Medical History:  Diagnosis Date  . Alcohol withdrawal ([place])   . B12 deficiency   . Chronic pain   . CVA (cerebral vascular accident) ([place])   . Hypertension   . Hypothyroidism   . Varicose veins    History reviewed. No pertinent family history. Past Surgical History:  Procedure Laterality Date  . CHOLECYSTECTOMY    . ESOPHAGOGASTRODUODENOSCOPY (EGD) WITH PROPOFOL N/A 10/24/2017   Procedure: ESOPHAGOGASTRODUODENOSCOPY (EGD) WITH PROPOFOL;  Surgeon: [name], MD;  Location: [facility] ENDOSCOPY;  Service: Endoscopy;  Laterality: N/A;  . LEG SURGERY     Patient Active Problem List   Diagnosis Date Noted  . Syncope 07/11/2019  . Abdominal pain 07/11/2019  . Alcohol abuse 07/11/2019  . Thrombocytopenia ([place]) 07/11/2019  . Normocytic anemia 07/11/2019  . Tobacco abuse 07/11/2019  . CVA (cerebral vascular accident) ([place])   . Suicidal behavior 08/28/2018  . Severe recurrent major depressive disorder with psychotic features ([place]) 08/28/2018  . Alcohol intoxication ([place]) 08/28/2018  . Suicidal ideation 08/28/2018  . Duodenitis   . Gastritis without bleeding   . Hypokalemia 10/22/2017  . Intractable vomiting with nausea   . Depression 04/10/2016  . Alcohol withdrawal delirium ([place])   . Alcohol withdrawal ([place]) 09/02/2015  . Alcohol use disorder, severe, dependence  ([place]) 09/02/2015  . 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[place]) 09/02/2015  . PTSD (post-traumatic stress disorder) 09/01/2015  . Hypothyroid 09/01/2015  . Gastric reflux 09/01/2015  . DDD (degenerative disc disease), lumbosacral 09/01/2015  . 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 Vitamin B12 deficiency 08/17/2015  . Pain of lumbar spine 05/19/2015  . 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 Varicose vein of leg 04/25/2015  . Abnormal LFTs (liver function tests) 03/31/2015  . Confusion state 03/31/2015  . Chronic pain of left lower extremity 03/31/2015      Prior to Admission medications   Medication Sig Start Date End Date Taking? Authorizing Provider  busPIRone (BUSPAR) 15 MG tablet Take 1 tablet (15 mg total) by mouth 2 (two) times daily. 09/24/18  Yes Lavella Hammock, MD  FLUoxetine (PROZAC) 40 MG capsule Take 1 capsule (40 mg total) by mouth daily. 09/24/18  Yes Lavella Hammock, MD  gabapentin (NEURONTIN) 300 MG capsule Take 2 capsules (600 mg total) by mouth 3 (three) times daily. 09/24/18  Yes Lavella Hammock, MD  hydrOXYzine (ATARAX/VISTARIL) 25 MG tablet Take 25 mg by mouth every 8 (eight) hours as needed for anxiety. 01/28/19  Yes [provider]  levothyroxine (SYNTHROID, LEVOTHROID) 125 MCG tablet Take 1 tablet (125 mcg total) by mouth daily before breakfast. 09/24/18  Yes Lavella Hammock, MD  pantoprazole (PROTONIX) 40 MG tablet Take 40 mg by mouth 2 (two) times daily. 01/28/19  Yes [provider]  prazosin (MINIPRESS) 1 MG capsule Take 1 capsule (1 mg total) by mouth at bedtime. 09/24/18  Yes Lavella Hammock, MD  propranolol (INDERAL) 10 MG tablet Take 1 tablet (10 mg total) by mouth 2 (two) times daily as needed. 09/24/18  Yes Lavella Hammock, MD  traZODone (DESYREL) 50 MG tablet Take 100 mg by mouth at bedtime as needed for sleep. 06/01/19  Yes [provider]     Allergies Patient has no known allergies.    Social History Social History   Tobacco Use  . Smoking status: Never Smoker  . Smokeless tobacco: Never Used  Substance Use Topics  . Alcohol use: Yes    Comment: daily- 1 pint of vodka  . Drug use: No    Review of Systems Patient denies headaches, rhinorrhea, blurry vision, numbness, shortness of breath, chest pain, edema, cough, abdominal pain, nausea, vomiting, diarrhea, dysuria, fevers, rashes or hallucinations unless otherwise stated above in HPI. ____________________________________________   PHYSICAL EXAM:  VITAL SIGNS: Vitals:   07/11/19 1330 07/11/19 1400  BP: 111/80 121/78  Pulse: 81 76  Resp:    Temp:    SpO2: 96% 97%    Constitutional: Alert and oriented.  Eyes: Conjunctivae are normal.  Head: Atraumatic. Nose: No congestion/rhinnorhea. Mouth/Throat: Mucous membranes are moist.   Neck: No stridor. Painless ROM.  Cardiovascular: Normal rate, regular rhythm. Grossly normal heart sounds.  Good peripheral circulation. Respiratory: Normal respiratory effort.  No retractions. Lungs CTAB. Gastrointestinal: Soft with mild ttp in RLQ.  + hepatomegaly. No distention. No abdominal bruits. No CVA tenderness. Genitourinary:  Musculoskeletal: No lower extremity tenderness nor edema.  No joint effusions. Neurologic:  Normal speech and language. No gross focal neurologic deficits are appreciated. No facial droop Skin:  Skin is warm, dry and intact. No rash noted. Psychiatric: Mood and affect are normal. Speech and behavior are normal.  ____________________________________________   LABS (all labs ordered are listed, but only abnormal results are displayed)  Results for orders placed or performed during the hospital encounter of 07/11/19 (from the past 24 hour(s))  Basic metabolic panel     Status: Abnormal   Collection Time: 07/11/19  3:52 AM  Result Value Ref Range   Sodium 134 (L) 135 - 145 mmol/L   Potassium  3.2 (L) 3.5 - 5.1 mmol/L   Chloride 95 (L) 98 - 111 mmol/L   CO2 26 22 - 32 mmol/L   Glucose, Bld 133 (H) 70 - 99 mg/dL   BUN <5 (L) 6 - 20 mg/dL   Creatinine, Ser 0.62 0.61 - 1.24 mg/dL   Calcium 8.5 (L) 8.9 - 10.3 mg/dL   GFR calc non Af Amer >60 >60 mL/min   GFR calc Af Amer >60 >60 mL/min   Anion gap 13 5 - 15  CBC     Status: Abnormal   Collection Time: 07/11/19  3:52 AM  Result Value Ref Range   WBC 5.3 4.0 - 10.5 K/uL   RBC 2.93 (L) 4.22 - 5.81 MIL/uL   Hemoglobin 9.7 (L) 13.0 - 17.0 g/dL   HCT 26.6 (L) 39.0 - 52.0 %   MCV 90.8 80.0 - 100.0 fL   MCH 33.1 26.0 - 34.0 pg   MCHC 36.5 (H) 30.0 - 36.0 g/dL   RDW 14.3 11.5 - 15.5 %   Platelets 70 (L) 150 - 400 K/uL   nRBC 0.0 0.0 - 0.2 %  Urinalysis, Complete w Microscopic     Status: Abnormal   Collection  Time: 07/11/19  3:52 AM  Result Value Ref Range   Color, Urine AMBER (A) YELLOW   APPearance CLEAR (A) CLEAR   Specific Gravity, Urine 1.015 1.005 - 1.030   pH 7.0 5.0 - 8.0   Glucose, UA NEGATIVE NEGATIVE mg/dL   Hgb urine dipstick NEGATIVE NEGATIVE   Bilirubin Urine SMALL (A) NEGATIVE   Ketones, ur NEGATIVE NEGATIVE mg/dL   Protein, ur 30 (A) NEGATIVE mg/dL   Nitrite NEGATIVE NEGATIVE   Leukocytes,Ua NEGATIVE NEGATIVE   RBC / HPF 0-5 0 - 5 RBC/hpf   WBC, UA 0-5 0 - 5 WBC/hpf   Bacteria, UA NONE SEEN NONE SEEN   Squamous Epithelial / LPF 0-5 0 - 5   Mucus PRESENT   Lipase, blood     Status: None   Collection Time: 07/11/19  3:52 AM  Result Value Ref Range   Lipase 35 11 - 51 U/L  Hepatic function panel     Status: Abnormal   Collection Time: 07/11/19  3:52 AM  Result Value Ref Range   Total Protein 7.1 6.5 - 8.1 g/dL   Albumin 3.3 (L) 3.5 - 5.0 g/dL   AST 189 (H) 15 - 41 U/L   ALT 47 (H) 0 - 44 U/L   Alkaline Phosphatase 134 (H) 38 - 126 U/L   Total Bilirubin 3.6 (H) 0.3 - 1.2 mg/dL   Bilirubin, Direct 1.9 (H) 0.0 - 0.2 mg/dL   Indirect Bilirubin 1.7 (H) 0.3 - 0.9 mg/dL  Troponin I (High Sensitivity)      Status: None   Collection Time: 07/11/19  3:52 AM  Result Value Ref Range   Troponin I (High Sensitivity) 6 <18 ng/L  Type and screen [facility]     Status: None   Collection Time: 07/11/19  9:25 AM  Result Value Ref Range   ABO/RH(D) O NEG    Antibody Screen NEG    Sample Expiration      07/14/2019,2359 Performed at [facility], [address] [place], [place] [address]   Troponin I (High Sensitivity)     Status: None   Collection Time: 07/11/19 10:56 AM  Result Value Ref Range   Troponin I (High Sensitivity) 6 <18 ng/L  Protime-INR     Status: Abnormal   Collection Time: 07/11/19 10:56 AM  Result Value Ref Range   Prothrombin Time 15.5 (H) 11.4 - 15.2 seconds   INR 1.2 0.8 - 1.2   ____________________________________________  EKG My review and personal interpretation at Time: 3:44   Indication: nausea  Rate: 110  Rhythm: sinus Axis: normal Other: normal intervals, no stemi, nonspecific st abn ____________________________________________  RADIOLOGY  I personally reviewed all radiographic images ordered to evaluate for the above acute complaints and reviewed radiology reports and findings.  These findings were personally discussed with the patient.  Please see medical record for radiology report.  ____________________________________________   PROCEDURES  Procedure(s) performed:  .Critical Care Performed by: Merlyn Lot, MD Authorized by: Merlyn Lot, MD   Critical care provider statement:    Critical care time (minutes):  30   Critical care time was exclusive of:  Separately billable procedures and treating other patients   Critical care was necessary to treat or prevent imminent or life-threatening deterioration of the following conditions:  Hepatic failure   Critical care was time spent personally by me on the following activities:  Development of treatment plan with patient or surrogate, discussions with consultants,  evaluation of patient's response to  treatment, examination of patient, obtaining history from patient or surrogate, ordering and performing treatments and interventions, ordering and review of laboratory studies, ordering and review of radiographic studies, pulse oximetry, re-evaluation of patient's condition and review of old [place] performed: yes ____________________________________________   INITIAL IMPRESSION / ASSESSMENT AND PLAN / ED COURSE  Pertinent labs & imaging results that were available during my care of the patient were reviewed by me and considered in my medical decision making (see chart for details).   DDX: Dehydration, electrolyte abnormality, cirrhosis, colitis, PUD, diverticulitis, mass, SBO  Ivan Hamilton is a 53 y.o. who presents to the ED with symptoms as described above.  Patient feel chronically ill-appearing.  Does have some abdominal tenderness therefore CT imaging will be ordered.  Blood will be sent for by differential.  Clinical Course as of Jul 10 1530  Sat Jul 11, 2019  1109 Patient with blood work findings concerning for decompensated cirrhosis.  He is hemodynamically stable at this time.  Not having any evidence of active bleed at this point.  Discussed case in consultation with Dr. Vicente Males, GI who does recommend admission to hospital for possible endoscopy following morning as well as work-up of probable alcoholic cirrhosis.  Have discussed with the patient and available family all diagnostics and treatments performed thus far and all questions were answered to the best of my ability. The patient demonstrates understanding and agreement with plan.    [PR]    Clinical Course User Index [PR] Merlyn Lot, MD    The patient was evaluated in Emergency Department today for the symptoms described in the history of present illness. He/she was evaluated in the context of the global COVID-19 pandemic, which necessitated consideration that  the patient might be at risk for infection with the SARS-CoV-2 virus that causes COVID-19. Institutional protocols and algorithms that pertain to the evaluation of patients at risk for COVID-19 are in a state of rapid change based on information released by regulatory bodies including the CDC and federal and state organizations. These policies and algorithms were followed during the patient's care in the ED.  As part of my medical decision making, I reviewed the following data within the [facility] notes reviewed and incorporated, Labs reviewed, notes from prior ED visits and [place] Controlled Substance Database   ____________________________________________   FINAL CLINICAL IMPRESSION(S) / ED DIAGNOSES  Final diagnoses:  Hepatic cirrhosis, unspecified hepatic cirrhosis type, unspecified whether ascites present ([place])  Near syncope      NEW MEDICATIONS STARTED DURING THIS VISIT:  New Prescriptions   No medications on file     Note:  This document was prepared using Dragon voice recognition software and may include unintentional dictation errors.    Merlyn Lot, MD 07/11/19 1531

## 2019-07-12 ENCOUNTER — Inpatient Hospital Stay (HOSPITAL_COMMUNITY)
Admit: 2019-07-12 | Discharge: 2019-07-12 | Disposition: A | Discharge status: Home / Self Care | Payer: Medicare HMO | Attending: Internal Medicine | Admitting: Internal Medicine

## 2019-07-12 ENCOUNTER — Inpatient Hospital Stay: Payer: Medicare HMO | Admitting: Anesthesiology

## 2019-07-12 ENCOUNTER — Encounter
Admission: EM | Disposition: A | Discharge status: Home / Self Care | Payer: Self-pay | Source: Home / Self Care | Attending: Internal Medicine

## 2019-07-12 DIAGNOSIS — I371 Nonrheumatic pulmonary valve insufficiency: Secondary | ICD-10-CM

## 2019-07-12 DIAGNOSIS — R7401 Elevation of levels of liver transaminase levels: Secondary | ICD-10-CM

## 2019-07-12 DIAGNOSIS — K5792 Diverticulitis of intestine, part unspecified, without perforation or abscess without bleeding: Principal | ICD-10-CM

## 2019-07-12 DIAGNOSIS — I361 Nonrheumatic tricuspid (valve) insufficiency: Secondary | ICD-10-CM

## 2019-07-12 HISTORY — PX: ESOPHAGOGASTRODUODENOSCOPY (EGD) WITH PROPOFOL: SHX5813

## 2019-07-12 LAB — COMPREHENSIVE METABOLIC PANEL
ALT: 38 U/L (ref 0–44)
AST: 138 U/L — ABNORMAL HIGH (ref 15–41)
Albumin: 2.8 g/dL — ABNORMAL LOW (ref 3.5–5.0)
Alkaline Phosphatase: 114 U/L (ref 38–126)
Anion gap: 11 (ref 5–15)
BUN: <5 mg/dL — ABNORMAL LOW (ref 6–20)
CO2: 24 mmol/L (ref 22–32)
Calcium: 8 mg/dL — ABNORMAL LOW (ref 8.9–10.3)
Chloride: 100 mmol/L (ref 98–111)
Creatinine, Ser: 0.69 mg/dL (ref 0.61–1.24)
GFR calc Af Amer: >60 mL/min (ref >60)
GFR calc non Af Amer: >60 mL/min (ref >60)
Glucose, Bld: 105 mg/dL — ABNORMAL HIGH (ref 70–99)
Potassium: 3.2 mmol/L — ABNORMAL LOW (ref 3.5–5.1)
Sodium: 135 mmol/L (ref 135–145)
Total Bilirubin: 4.2 mg/dL — ABNORMAL HIGH (ref 0.3–1.2)
Total Protein: 6.5 g/dL (ref 6.5–8.1)

## 2019-07-12 LAB — URINE DRUG SCREEN, QUALITATIVE (ARMC ONLY)
Amphetamines, Ur Screen: NOT DETECTED
Barbiturates, Ur Screen: NOT DETECTED
Benzodiazepine, Ur Scrn: NOT DETECTED
Cannabinoid 50 Ng, Ur ~~LOC~~: NOT DETECTED
Cocaine Metabolite,Ur ~~LOC~~: NOT DETECTED
MDMA (Ecstasy)Ur Screen: NOT DETECTED
Methadone Scn, Ur: NOT DETECTED
Opiate, Ur Screen: POSITIVE — AB
Phencyclidine (PCP) Ur S: NOT DETECTED
Tricyclic, Ur Screen: NOT DETECTED

## 2019-07-12 LAB — OCCULT BLOOD X 1 CARD TO LAB, STOOL: Fecal Occult Bld: POSITIVE — AB

## 2019-07-12 LAB — ECHOCARDIOGRAM COMPLETE
Height: 65 in
Weight: 2192 oz

## 2019-07-12 LAB — HIV ANTIBODY (ROUTINE TESTING W REFLEX): HIV Screen 4th Generation wRfx: NONREACTIVE

## 2019-07-12 LAB — GLUCOSE, CAPILLARY: Glucose-Capillary: 94 mg/dL (ref 70–99)

## 2019-07-12 SURGERY — ESOPHAGOGASTRODUODENOSCOPY (EGD) WITH PROPOFOL
Anesthesia: General

## 2019-07-12 MED ORDER — LIDOCAINE HCL (PF) 2 % IJ SOLN
INTRAMUSCULAR | Status: AC
  Filled 2019-07-12: qty 5

## 2019-07-12 MED ORDER — ACETAMINOPHEN 325 MG PO TABS
650.0000 mg | ORAL_TABLET | Freq: Four times a day (QID) | ORAL | Status: DC | PRN
  Administered 2019-07-14 – 2019-07-17 (×5): 650 mg via ORAL
  Filled 2019-07-12 (×5): qty 2

## 2019-07-12 MED ORDER — SODIUM CHLORIDE 0.9 % IV SOLN: INTRAVENOUS | Status: DC

## 2019-07-12 MED ORDER — PROPOFOL 500 MG/50ML IV EMUL
INTRAVENOUS | Status: AC
  Filled 2019-07-12: qty 50

## 2019-07-12 MED ORDER — PROPRANOLOL HCL 20 MG PO TABS
10.0000 mg | ORAL_TABLET | Freq: Two times a day (BID) | ORAL | Status: DC
  Administered 2019-07-12 – 2019-07-13 (×2): 10 mg via ORAL
  Filled 2019-07-12 (×6): qty 1

## 2019-07-12 MED ORDER — PHENYLEPHRINE HCL (PRESSORS) 10 MG/ML IV SOLN
INTRAVENOUS | Status: DC | PRN
  Administered 2019-07-12 (×2): 100 ug via INTRAVENOUS

## 2019-07-12 MED ORDER — SODIUM CHLORIDE 0.9 % IV SOLN
INTRAVENOUS | Status: DC
  Administered 2019-07-12: 1000 mL via INTRAVENOUS

## 2019-07-12 MED ORDER — POTASSIUM CHLORIDE CRYS ER 20 MEQ PO TBCR
40.0000 meq | EXTENDED_RELEASE_TABLET | Freq: Once | ORAL | Status: AC
  Administered 2019-07-12: 40 meq via ORAL
  Filled 2019-07-12: qty 2

## 2019-07-12 MED ORDER — PEG 3350-KCL-NA BICARB-NACL 420 G PO SOLR
4000.0000 mL | Freq: Once | ORAL | Status: AC
  Administered 2019-07-12: 4000 mL via ORAL
  Filled 2019-07-12 (×3): qty 4000

## 2019-07-12 MED ORDER — PROPOFOL 10 MG/ML IV BOLUS
INTRAVENOUS | Status: DC | PRN
  Administered 2019-07-12: 40 mg via INTRAVENOUS
  Administered 2019-07-12: 60 mg via INTRAVENOUS

## 2019-07-12 NOTE — H&P (Signed)
Jonathon Bellows, MD [address], [place], [place], [place], [address] [address], [place], [place], [place], [address] Phone: [phone]  Fax: [phone]  Primary Care Physician:  Tracie Harrier, MD   Pre-Procedure History & Physical: HPI:  Ivan Hamilton is a 53 y.o. male is here for an endoscopy    Past Medical History:  Diagnosis Date  . Alcohol withdrawal ([place])   . B12 deficiency   . Chronic pain   . CVA (cerebral vascular accident) ([place])   . Hypertension   . Hypothyroidism   . Varicose veins     Past Surgical History:  Procedure Laterality Date  . CHOLECYSTECTOMY    . ESOPHAGOGASTRODUODENOSCOPY (EGD) WITH PROPOFOL N/A 10/24/2017   Procedure: ESOPHAGOGASTRODUODENOSCOPY (EGD) WITH PROPOFOL;  Surgeon: [name], MD;  Location: [facility] ENDOSCOPY;  Service: Endoscopy;  Laterality: N/A;  . LEG SURGERY      Prior to Admission medications   Medication Sig Start Date End Date Taking? Authorizing Provider  busPIRone (BUSPAR) 15 MG tablet Take 1 tablet (15 mg total) by mouth 2 (two) times daily. 09/24/18  Yes Lavella Hammock, MD  FLUoxetine (PROZAC) 40 MG capsule Take 1 capsule (40 mg total) by mouth daily. 09/24/18  Yes Lavella Hammock, MD  gabapentin (NEURONTIN) 300 MG capsule Take 2 capsules (600 mg total) by mouth 3 (three) times daily. 09/24/18  Yes Lavella Hammock, MD  hydrOXYzine (ATARAX/VISTARIL) 25 MG tablet Take 25 mg by mouth every 8 (eight) hours as needed for anxiety. 01/28/19  Yes [provider]  levothyroxine (SYNTHROID, LEVOTHROID) 125 MCG tablet Take 1 tablet (125 mcg total) by mouth daily before breakfast. 09/24/18  Yes Lavella Hammock, MD  pantoprazole (PROTONIX) 40 MG tablet Take 40 mg by mouth 2 (two) times daily. 01/28/19  Yes [provider]  prazosin (MINIPRESS) 1 MG capsule Take 1 capsule (1 mg total) by mouth at bedtime. 09/24/18  Yes Lavella Hammock, MD  propranolol (INDERAL) 10 MG tablet Take 1 tablet (10 mg total) by mouth 2  (two) times daily as needed. 09/24/18  Yes Lavella Hammock, MD  traZODone (DESYREL) 50 MG tablet Take 100 mg by mouth at bedtime as needed for sleep. 06/01/19  Yes [provider]    Allergies as of 07/11/2019  . (No Known Allergies)    History reviewed. No pertinent family history.  Social History   Socioeconomic History  . Marital status: Married    Spouse name: Not on file  . Number of children: Not on file  . Years of education: Not on file  . Highest education level: Not on file  Occupational History  . Not on file  Tobacco Use  . Smoking status: Never Smoker  . Smokeless tobacco: Never Used  Substance and Sexual Activity  . Alcohol use: Yes    Comment: daily- 1 pint of vodka  . Drug use: No  . Sexual activity: Not on file  Other Topics Concern  . Not on file  Social History Narrative  . Not on file   Social Determinants of Health   Financial Resource Strain:   . Difficulty of Paying Living Expenses: Not on file  Food Insecurity:   . Worried About Charity fundraiser in the Last Year: Not on file  . Ran Out of Food in the Last Year: Not on file  Transportation Needs:   . Lack of Transportation (Medical): Not on file  . Lack of Transportation (Non-Medical): Not on  file  Physical Activity:   . Days of Exercise per Week: Not on file  . Minutes of Exercise per Session: Not on file  Stress:   . Feeling of Stress : Not on file  Social Connections:   . Frequency of Communication with Friends and Family: Not on file  . Frequency of Social Gatherings with Friends and Family: Not on file  . Attends Religious Services: Not on file  . Active Member of Clubs or Organizations: Not on file  . Attends Archivist Meetings: Not on file  . Marital Status: Not on file  Intimate Partner Violence:   . Fear of Current or Ex-Partner: Not on file  . Emotionally Abused: Not on file  . Physically Abused: Not on file  . Sexually Abused: Not on file    Review of  Systems: See HPI, otherwise negative ROS  Physical Exam: BP 114/80   Pulse 92   Temp 98.6 F (37 C) (Oral)   Resp 18   Ht 5\' 5"  (1.651 m)   Wt 62.1 kg   SpO2 98%   BMI 22.80 kg/m  General:   Alert,  pleasant and cooperative in NAD Head:  Normocephalic and atraumatic. Neck:  Supple; no masses or thyromegaly. Lungs:  Clear throughout to auscultation, normal respiratory effort.    Heart:  +S1, +S2, Regular rate and rhythm, No edema. Abdomen:  Soft, nontender and nondistended. Normal bowel sounds, without guarding, and without rebound.   Neurologic:  Alert and  oriented x4;  grossly normal neurologically.  Impression/Plan: Ivan Hamilton is here for an endoscopy  to be performed for  evaluation of melena    Risks, benefits, limitations, and alternatives regarding endoscopy have been reviewed with the patient.  Questions have been answered.  All parties agreeable.   Jonathon Bellows, MD  07/12/2019, 3:08 PM

## 2019-07-12 NOTE — Op Note (Signed)
[facility] Patient Name: Ivan Hamilton Procedure Date: 07/12/2019 2:58 PM MRN: [id number] Account #: [id number] Date of Birth: Jan 29, 1966 Admit Type: Outpatient Age: 53 Room: [facility] ENDO ROOM 4 Gender: Male Note Status: Finalized Procedure:             Upper GI endoscopy Indications:           Melena Providers:             Jonathon Bellows MD, MD Medicines:             Monitored Anesthesia Care Complications:         No immediate complications. Procedure:             Pre-Anesthesia Assessment:                        - Prior to the procedure, a History and Physical was                         performed, and patient medications, allergies and                         sensitivities were reviewed. The patient's tolerance                         of previous anesthesia was reviewed.                        - The risks and benefits of the procedure and the                         sedation options and risks were discussed with the                         patient. All questions were answered and informed                         consent was obtained.                        - ASA Grade Assessment: III - A patient with severe                         systemic disease.                        After obtaining informed consent, the endoscope was                         passed under direct vision. Throughout the procedure,                         the patient's blood pressure, pulse, and oxygen                         saturations were monitored continuously. The Endoscope                         was introduced through the mouth, and advanced to the  third part of duodenum. The upper GI endoscopy was                         accomplished with ease. The patient tolerated the                         procedure well. Findings:      The examined duodenum was normal.      Mild portal hypertensive gastropathy was found in the entire examined   stomach.      Grade I varices were found in the lower third of the esophagus. They       were medium in size.      The cardia and gastric fundus were normal on retroflexion. Impression:            - Normal examined duodenum.                        - Portal hypertensive gastropathy.                        - Grade I esophageal varices.                        - No specimens collected. Recommendation:        - Return patient to hospital ward for ongoing care.                        - Clear liquid diet today.                        - Continue present medications.                        - Perform a colonoscopy today. Procedure Code(s):     --- Professional ---                        [phone], Esophagogastroduodenoscopy, flexible,                         transoral; diagnostic, including collection of                         specimen(s) by brushing or washing, when performed                         (separate procedure) Diagnosis Code(s):     --- Professional ---                        K76.6, Portal hypertension                        K31.89, Other diseases of stomach and duodenum                        I85.00, Esophageal varices without bleeding                        K92.1, Melena (includes Hematochezia) CPT copyright 2019 American Medical Association. All rights reserved. The codes documented in this report are preliminary and upon coder review may  be  revised to meet current compliance requirements. Jonathon Bellows, MD Jonathon Bellows MD, MD 07/12/2019 3:32:26 PM This report has been signed electronically. Number of Addenda: 0 Note Initiated On: 07/12/2019 2:58 PM Estimated Blood Loss:  Estimated blood loss: none.      [facility]

## 2019-07-12 NOTE — Progress Notes (Signed)
PROGRESS NOTE    Ivan Hamilton  [id number] DOB: 12-18-1965 DOA: 07/11/2019 PCP: Tracie Harrier, MD       Assessment & Plan:   Principal Problem:   Abdominal pain Active Problems:   Hypothyroid   Gastric reflux   Hypokalemia   Depression   Syncope   Alcohol abuse   Thrombocytopenia (HCC)   Normocytic anemia   Tobacco abuse   CVA (cerebral vascular accident) ([place])   HTN (hypertension)    Abdominal pain: etiology unclear, possible diverticulitis as per CT. Continue on IV cipro & flagyl. Blood cxs NGTD. Zofran prn for nausea/vomiting. Morphine prn for pain. Continue on IVFs.   Normocytic anemia: recent drop in H&H form 14.5 on 09/23/18 to 9.7. C/o melena in recent past weeks. Endoscopy today as per GI. NPO. Continue on IV protonix   Alcohol abuse: continue on CIWA protocol. Alcohol cessation counseling. Last drink as per pt was Wednesday. Drinks 1/2 pint of tequila daily for many years  Portal HTN: will continue on home dose of propanolol  Transaminitis: ALT is WNL, AST is elevated. Likely secondary to alcohol abuse. Will continue to monitor.   GERD: continue on IV protonix   Depression: severity unknown. Will continue on home dose of buspirone & fluoxetine.  Anxiety: severity unknown. Will continue on hydroxyzine prn   Hypothyroidism: continue on home dose of levothyroxine  HTN: continue on home dose of prazosin & propanolol  Hypokalemia: KCl repleated. Will continue to monitor  Syncope: etiology unclear, ddx CVA vs alcohol abuse. Given history of stroke, will need to r/o CVA. MRI brain shows no acute findings. Echo pending.   Thrombocytopenia: likely secondary to chronic liver disease.Will continue to monitor   Hx of CVA: not taking meds at home. MRI brain is neg for acute findings    DVT prophylaxis:  SCDs Code Status:  Full  Family Communication:  Disposition Plan:    Consultants:   GI   Procedures: endoscopy today     Antimicrobials: cipro & flagyl    Subjective: Pt c/o being hungry and abd pain.   Objective: Vitals:   07/11/19 1806 07/11/19 2102 07/12/19 0443 07/12/19 1214  BP: 125/87 107/81 90/68 114/80  Pulse: (!) 120 92 81 92  Resp:  16 18 18   Temp:  98.4 F (36.9 C) 97.9 F (36.6 C) 98.6 F (37 C)  TempSrc:  Oral Oral Oral  SpO2:  100% 99% 98%  Weight:      Height:        Intake/Output Summary (Last 24 hours) at 07/12/2019 1332 Last data filed at 07/12/2019 0500 Gross per 24 hour  Intake 450 ml  Output 1300 ml  Net -850 ml   Filed Weights   07/11/19 0339  Weight: 62.1 kg    Examination:  General exam: Appears calm and comfortable  Respiratory system: Clear to auscultation. Respiratory effort normal. Cardiovascular system: S1 & S2 +. No  rubs, gallops or clicks. No pedal edema. Gastrointestinal system: Abdomen is nondistended, soft. Mild tenderness to palpation  Normal bowel sounds heard. Psychiatry: Judgement and insight appear normal. Mood & affect appropriate.     Data Reviewed: I have personally reviewed following labs and imaging studies  CBC: Recent Labs  Lab 07/11/19 0352 07/11/19 1835  WBC 5.3 5.2  HGB 9.7* 10.0*  HCT 26.6* 28.9*  MCV 90.8 94.4  PLT 70* 82*   Basic Metabolic Panel: Recent Labs  Lab 07/11/19 0352 07/11/19 1835 07/12/19 0701  NA 134* 136 135  K 3.2*  3.4* 3.2*  CL 95* 99 100  CO2 26 25 24   GLUCOSE 133* 134* 105*  BUN <5* <5* <5*  CREATININE 0.62 0.60* 0.69  CALCIUM 8.5* 8.4* 8.0*  MG  --  1.4*  --   PHOS  --  1.6*  --    GFR: Estimated Creatinine Clearance: 90.8 mL/min (by C-G formula based on SCr of 0.69 mg/dL). Liver Function Tests: Recent Labs  Lab 07/11/19 0352 07/11/19 1835 07/12/19 0701  AST 189* 172* 138*  ALT 47* 45* 38  ALKPHOS 134* 127* 114  BILITOT 3.6* 4.1* 4.2*  PROT 7.1 7.1 6.5  ALBUMIN 3.3* 3.2* 2.8*   Recent Labs  Lab 07/11/19 0352  LIPASE 35   Recent Labs  Lab 07/11/19 1835  AMMONIA 51*    Coagulation Profile: Recent Labs  Lab 07/11/19 1056  INR 1.2   Cardiac Enzymes: No results for input(s): CKTOTAL, CKMB, CKMBINDEX, TROPONINI in the last 168 hours. BNP (last 3 results) No results for input(s): PROBNP in the last 8760 hours. HbA1C: No results for input(s): HGBA1C in the last 72 hours. CBG: Recent Labs  Lab 07/12/19 0808  GLUCAP 94   Lipid Profile: No results for input(s): CHOL, HDL, LDLCALC, TRIG, CHOLHDL, LDLDIRECT in the last 72 hours. Thyroid Function Tests: No results for input(s): TSH, T4TOTAL, FREET4, T3FREE, THYROIDAB in the last 72 hours. Anemia Panel: No results for input(s): VITAMINB12, FOLATE, FERRITIN, TIBC, IRON, RETICCTPCT in the last 72 hours. Sepsis Labs: No results for input(s): PROCALCITON, LATICACIDVEN in the last 168 hours.  Recent Results (from the past 240 hour(s))  SARS CORONAVIRUS 2 (TAT 6-24 HRS) Nasopharyngeal Nasopharyngeal Swab     Status: None   Collection Time: 07/11/19 11:22 AM   Specimen: Nasopharyngeal Swab  Result Value Ref Range Status   SARS Coronavirus 2 NEGATIVE NEGATIVE Final    Comment: (NOTE) SARS-CoV-2 target nucleic acids are NOT DETECTED. The SARS-CoV-2 RNA is generally detectable in upper and lower respiratory specimens during the acute phase of infection. Negative results do not preclude SARS-CoV-2 infection, do not rule out co-infections with other pathogens, and should not be used as the sole basis for treatment or other patient management decisions. Negative results must be combined with clinical observations, patient history, and epidemiological information. The expected result is Negative. Fact Sheet for Patients: SugarRoll.be Fact Sheet for Healthcare Providers: https://www.woods-mathews.com/ This test is not yet approved or cleared by the [place] FDA and  has been authorized for detection and/or diagnosis of SARS-CoV-2 by FDA under an Emergency Use  Authorization (EUA). This EUA will remain  in effect (meaning this test can be used) for the duration of the COVID-19 declaration under Section 56 4(b)(1) of the Act, 21 U.S.C. section 360bbb-3(b)(1), unless the authorization is terminated or revoked sooner. Performed at [facility], [place] [address] [place], [place] [address]   CULTURE, BLOOD (ROUTINE X 2) w Reflex to ID Panel     Status: None (Preliminary result)   Collection Time: 07/11/19  1:45 PM   Specimen: BLOOD  Result Value Ref Range Status   Specimen Description BLOOD BLOOD RIGHT FOREARM  Final   Special Requests   Final    BOTTLES DRAWN AEROBIC AND ANAEROBIC Blood Culture adequate volume   Culture   Final    NO GROWTH < 24 HOURS Performed at [facility], [address] [place], [place] [address]    Report Status PENDING  Incomplete  CULTURE, BLOOD (ROUTINE X 2) w Reflex to ID  Panel     Status: None (Preliminary result)   Collection Time: 07/11/19  1:45 PM   Specimen: BLOOD  Result Value Ref Range Status   Specimen Description BLOOD BLOOD LEFT FOREARM  Final   Special Requests   Final    BOTTLES DRAWN AEROBIC AND ANAEROBIC Blood Culture adequate volume   Culture   Final    NO GROWTH < 24 HOURS Performed at [facility], [address], [place], [place] [address]    Report Status PENDING  Incomplete         Radiology Studies: MR BRAIN WO CONTRAST  Result Date: 07/11/2019 CLINICAL DATA:  Initial evaluation for acute syncope/presyncope. EXAM: MRI HEAD WITHOUT CONTRAST TECHNIQUE: Multiplanar, multiecho pulse sequences of the brain and surrounding structures were obtained without intravenous contrast. COMPARISON:  Prior MRI from 10/25/2017. FINDINGS: Brain: Mild cerebral atrophy for age. Small remote lacunar infarct versus small vascular lesion such as a capillary telangiectasia at the central pons, stable. No abnormal foci of restricted diffusion to suggest acute or subacute ischemia.  Gray-white matter differentiation well maintained. No encephalomalacia to suggest chronic infarction. No foci of susceptibility artifact to suggest acute or chronic intracranial hemorrhage. No mass lesion, midline shift or mass effect. No hydrocephalus. No extra-axial fluid collection. Major dural sinuses are grossly patent. Pituitary gland and suprasellar region are normal. Midline structures intact and normal. Vascular: Major intracranial vascular flow voids well maintained and normal in appearance. Skull and upper cervical spine: Craniocervical junction normal. Visualized upper cervical spine within normal limits. Bone marrow signal intensity normal. No scalp soft tissue abnormality. Sinuses/Orbits: Globes and orbital soft tissues within normal limits. Paranasal sinuses are clear. No mastoid effusion. Inner ear structures normal. Other: None. IMPRESSION: 1. No acute intracranial abnormality. 2. Stable mild cerebral atrophy for age. Electronically Signed   By: Jeannine Boga M.D.   On: 07/11/2019 22:11   CT ABDOMEN PELVIS W CONTRAST  Result Date: 07/11/2019 CLINICAL DATA:  Abdominal abscess/infection suspected with right lower quadrant pain, pain under ribs. EXAM: CT ABDOMEN AND PELVIS WITH CONTRAST TECHNIQUE: Multidetector CT imaging of the abdomen and pelvis was performed using the standard protocol following bolus administration of intravenous contrast. CONTRAST:  189mL OMNIPAQUE IOHEXOL 300 MG/ML  SOLN COMPARISON:  06/16/2019 FINDINGS: Lower chest: Signs of basilar atelectasis. No signs of consolidation or of pleural effusion. Hepatobiliary: Marked hepatic steatosis with lobular hepatic contours not changed from previous exam. Enhancement pattern suggests underlying fibrosis with heterogeneous lace-like appearance. No signs of biliary ductal distension following cholecystectomy. Pancreas: Pancreas is normal without focal lesion or ductal dilation. No peripancreatic inflammation. Spleen: Spleen with  signs of mild enlargement, approximately 12 cm in greatest craniocaudal dimension but with perisplenic and perigastric collaterals in the setting of presumed liver disease. Adrenals/Urinary Tract: Adrenal glands are normal. Symmetric enhancement is noted of the bilateral kidneys without signs of hydronephrosis. Urinary bladder is normal. Stomach/Bowel: No signs of small bowel obstruction or acute gastric process. The appendix is seen in the right lower quadrant and is normal. There is generalized edema about the bowel in this patient with signs of portal hypertension. Smiley asymmetric stranding noted about the descending colon. Marked colonic diverticulosis in the sigmoid colon. Vascular/Lymphatic: Patent portal vein. Signs of portosystemic collaterals in the upper abdomen about the stomach and spleen. Small left splenorenal shunt. Signs of calcific atherosclerotic changes, minimal in in the abdominal aorta. No signs of abdominal lymphadenopathy or upper abdominal lymphadenopathy. No signs of pelvic lymphadenopathy. Reproductive: Prostate grossly normal by CT.  Other: No abdominal wall hernia or abnormality. No abdominopelvic ascites. Musculoskeletal: No sign of acute bone finding or destructive bone process. IMPRESSION: 1. Smiley asymmetric stranding about the descending colon, which may represent a focal area of diverticulitis, also some mild thickening of the ascending colon which is generalized could be related to early portal colopathy. 2. Signs of liver disease with evidence of portosystemic collaterals in the upper abdomen. Constellation of findings on today's study including some increase in generalized and bowel edema may reflect early hepatic decompensation of existing liver disease. Clinical correlation is suggested. 3. Splenomegaly with portosystemic collaterals in the upper abdomen, consistent with portal hypertension. 4. Aortic atherosclerosis. Aortic Atherosclerosis (ICD10-I70.0). Electronically  Signed   By: Zetta Bills M.D.   On: 07/11/2019 10:30   DG Chest Portable 1 View  Result Date: 07/11/2019 CLINICAL DATA:  Near syncope, nausea, dizziness and chest tightness. EXAM: PORTABLE CHEST 1 VIEW COMPARISON:  08/11/2014 FINDINGS: The heart size and mediastinal contours are within normal limits. There is no evidence of pulmonary edema, consolidation, pneumothorax, nodule or pleural fluid. The visualized skeletal structures are unremarkable. IMPRESSION: No active disease. Electronically Signed   By: Aletta Edouard M.D.   On: 07/11/2019 09:38        Scheduled Meds: . busPIRone  15 mg Oral BID  . FLUoxetine  40 mg Oral Daily  . folic acid  1 mg Oral Daily  . gabapentin  600 mg Oral TID  . levothyroxine  125 mcg Oral QAC breakfast  . LORazepam  0-4 mg Intravenous Q6H   Followed by  . [START ON 07/13/2019] LORazepam  0-4 mg Intravenous Q12H  . multivitamin with minerals  1 tablet Oral Daily  . pantoprazole (PROTONIX) IV  40 mg Intravenous Q12H  . prazosin  1 mg Oral QHS  . propranolol  10 mg Oral BID  . thiamine  100 mg Oral Daily   Or  . thiamine  100 mg Intravenous Daily   Continuous Infusions: . sodium chloride 75 mL/hr at 07/11/19 1448  . ciprofloxacin 400 mg (07/12/19 1042)  . metronidazole 500 mg (07/12/19 0616)     LOS: 1 day    Time spent: 33 mins    Wyvonnia Dusky, MD [facility] Pager [phone]  If 7PM-7AM, please contact night-coverage [email or web address] Password [email or web address] 07/12/2019, 1:32 PM

## 2019-07-12 NOTE — Transfer of Care (Signed)
Immediate Anesthesia Transfer of Care Note  Patient: Ivan Hamilton  Procedure(s) Performed: ESOPHAGOGASTRODUODENOSCOPY (EGD) WITH PROPOFOL (N/A )  Patient Location: PACU  Anesthesia Type:General  Level of Consciousness: sedated  Airway & Oxygen Therapy: Patient Spontanous Breathing and Patient connected to nasal cannula oxygen  Post-op Assessment: Report given to RN and Post -op Vital signs reviewed and stable  Post vital signs: Reviewed and stable  Last Vitals:  Vitals Value Taken Time  BP 85/65 07/12/19 1546  Temp    Pulse 84 07/12/19 1549  Resp 12 07/12/19 1549  SpO2 98 % 07/12/19 1549  Vitals shown include unvalidated device data.  Last Pain:  Vitals:   07/12/19 1214  TempSrc: Oral  PainSc:       Patients Stated Pain Goal: 0 ([id number])  Complications: No apparent anesthesia complications

## 2019-07-12 NOTE — Anesthesia Procedure Notes (Signed)
Date/Time: 07/12/2019 3:15 PM Performed by: Allean Found, CRNA Pre-anesthesia Checklist: Patient identified, Emergency Drugs available, Suction available, Patient being monitored and Timeout performed Patient Re-evaluated:Patient Re-evaluated prior to induction Oxygen Delivery Method: Nasal cannula Placement Confirmation: positive ETCO2

## 2019-07-12 NOTE — Anesthesia Postprocedure Evaluation (Signed)
Anesthesia Post Note  Patient: Waverly Thimm  Procedure(s) Performed: ESOPHAGOGASTRODUODENOSCOPY (EGD) WITH PROPOFOL (N/A )  Patient location during evaluation: PACU Anesthesia Type: General Level of consciousness: awake and alert Pain management: pain level controlled Vital Signs Assessment: post-procedure vital signs reviewed and stable Respiratory status: spontaneous breathing, nonlabored ventilation and respiratory function stable Cardiovascular status: blood pressure returned to baseline and stable Postop Assessment: no apparent nausea or vomiting Anesthetic complications: no     Last Vitals:  Vitals:   07/12/19 1605 07/12/19 1610  BP: 94/72 99/75  Pulse: 86 (!) 113  Resp: 14 19  Temp: 36.4 C   SpO2: 96% 96%    Last Pain:  Vitals:   07/12/19 1600  TempSrc:   PainSc: 0-No pain                 Brett Canales Jinger Middlesworth

## 2019-07-12 NOTE — Anesthesia Post-op Follow-up Note (Signed)
Anesthesia QCDR form completed.        

## 2019-07-12 NOTE — Progress Notes (Signed)
Pt came back from Endo. Pt is showing signs of alcohol withdraw. Confused, disorientation, and hallucination noted by family member and primary Therapist, sports. Daughter at bedside and will be staying tonight due to pt. confusion . Pt has started drinking bowel prep . He will be NPO at midnight for colonoscopy tomorrow. Daughter signed consent for colonoscopy.

## 2019-07-12 NOTE — Progress Notes (Signed)
EGD:  1. Small non bleeding esophageal varices. Portal  HTN gastropathy   Plan   1. Colonoscopy tomorrow  2. Clear liquid diet till 5 pm 3. Commence on nadolol 20 mg once daily close to discharge 4. No alcohol consumption  Dr Jonathon Bellows MD,MRCP [facility]) Gastroenterology/Hepatology Pager: [phone]

## 2019-07-13 ENCOUNTER — Encounter
Admission: EM | Disposition: A | Discharge status: Home / Self Care | Payer: Self-pay | Source: Home / Self Care | Attending: Internal Medicine

## 2019-07-13 ENCOUNTER — Encounter: Payer: Self-pay | Admitting: *Deleted

## 2019-07-13 DIAGNOSIS — F1010 Alcohol abuse, uncomplicated: Secondary | ICD-10-CM

## 2019-07-13 DIAGNOSIS — R1013 Epigastric pain: Secondary | ICD-10-CM

## 2019-07-13 DIAGNOSIS — R7401 Elevation of levels of liver transaminase levels: Secondary | ICD-10-CM

## 2019-07-13 DIAGNOSIS — K7460 Unspecified cirrhosis of liver: Secondary | ICD-10-CM

## 2019-07-13 DIAGNOSIS — F3289 Other specified depressive episodes: Secondary | ICD-10-CM

## 2019-07-13 LAB — CBC
HCT: 29.3 % — ABNORMAL LOW (ref 39.0–52.0)
Hemoglobin: 9.7 g/dL — ABNORMAL LOW (ref 13.0–17.0)
MCH: 33.1 pg (ref 26.0–34.0)
MCHC: 33.1 g/dL (ref 30.0–36.0)
MCV: 100 fL (ref 80.0–100.0)
Platelets: 89 10*3/uL — ABNORMAL LOW (ref 150–400)
RBC: 2.93 MIL/uL — ABNORMAL LOW (ref 4.22–5.81)
RDW: 15.3 % (ref 11.5–15.5)
WBC: 3.8 10*3/uL — ABNORMAL LOW (ref 4.0–10.5)
nRBC: 0 % (ref 0.0–0.2)

## 2019-07-13 LAB — COMPREHENSIVE METABOLIC PANEL
ALT: 36 U/L (ref 0–44)
AST: 138 U/L — ABNORMAL HIGH (ref 15–41)
Albumin: 2.8 g/dL — ABNORMAL LOW (ref 3.5–5.0)
Alkaline Phosphatase: 107 U/L (ref 38–126)
Anion gap: 10 (ref 5–15)
BUN: <5 mg/dL — ABNORMAL LOW (ref 6–20)
CO2: 21 mmol/L — ABNORMAL LOW (ref 22–32)
Calcium: 8.2 mg/dL — ABNORMAL LOW (ref 8.9–10.3)
Chloride: 110 mmol/L (ref 98–111)
Creatinine, Ser: 0.57 mg/dL — ABNORMAL LOW (ref 0.61–1.24)
GFR calc Af Amer: >60 mL/min (ref >60)
GFR calc non Af Amer: >60 mL/min (ref >60)
Glucose, Bld: 104 mg/dL — ABNORMAL HIGH (ref 70–99)
Potassium: 3.8 mmol/L (ref 3.5–5.1)
Sodium: 141 mmol/L (ref 135–145)
Total Bilirubin: 4.9 mg/dL — ABNORMAL HIGH (ref 0.3–1.2)
Total Protein: 6.7 g/dL (ref 6.5–8.1)

## 2019-07-13 LAB — MAGNESIUM: Magnesium: 1.7 mg/dL (ref 1.7–2.4)

## 2019-07-13 LAB — GLUCOSE, CAPILLARY: Glucose-Capillary: 92 mg/dL (ref 70–99)

## 2019-07-13 LAB — MRSA PCR SCREENING: MRSA by PCR: POSITIVE — AB

## 2019-07-13 SURGERY — COLONOSCOPY WITH PROPOFOL
Anesthesia: General

## 2019-07-13 MED ORDER — DEXMEDETOMIDINE HCL IN NACL 200 MCG/50ML IV SOLN
0.4000 ug/kg/h | INTRAVENOUS | Status: DC
  Filled 2019-07-13: qty 50

## 2019-07-13 MED ORDER — K PHOS MONO-SOD PHOS DI & MONO 155-852-130 MG PO TABS
500.0000 mg | ORAL_TABLET | Freq: Four times a day (QID) | ORAL | Status: DC
  Filled 2019-07-13: qty 2

## 2019-07-13 MED ORDER — CHLORHEXIDINE GLUCONATE CLOTH 2 % EX PADS
6.0000 | MEDICATED_PAD | Freq: Every day | CUTANEOUS | Status: DC
  Administered 2019-07-13 – 2019-07-17 (×4): 6 via TOPICAL

## 2019-07-13 MED ORDER — MAGNESIUM SULFATE 2 GM/50ML IV SOLN
2.0000 g | Freq: Once | INTRAVENOUS | Status: AC
  Administered 2019-07-13: 2 g via INTRAVENOUS
  Filled 2019-07-13: qty 50

## 2019-07-13 MED ORDER — DEXMEDETOMIDINE HCL IN NACL 400 MCG/100ML IV SOLN
0.4000 ug/kg/h | INTRAVENOUS | Status: DC
  Administered 2019-07-13: 0.4 ug/kg/h via INTRAVENOUS
  Administered 2019-07-13: 0.8 ug/kg/h via INTRAVENOUS
  Administered 2019-07-13: 1.2 ug/kg/h via INTRAVENOUS
  Administered 2019-07-14: 0.9 ug/kg/h via INTRAVENOUS
  Filled 2019-07-13 (×5): qty 100

## 2019-07-13 MED ORDER — K PHOS MONO-SOD PHOS DI & MONO 155-852-130 MG PO TABS
500.0000 mg | ORAL_TABLET | Freq: Four times a day (QID) | ORAL | Status: DC
  Administered 2019-07-13 – 2019-07-15 (×6): 500 mg via ORAL
  Filled 2019-07-13 (×13): qty 2

## 2019-07-13 MED ORDER — HALOPERIDOL LACTATE 5 MG/ML IJ SOLN
5.0000 mg | Freq: Once | INTRAMUSCULAR | Status: AC
  Administered 2019-07-13: 5 mg via INTRAVENOUS
  Filled 2019-07-13: qty 1

## 2019-07-13 NOTE — Progress Notes (Signed)
Patient with ongoing uncontrolled withdrawal symptoms throughout shif and without relief from scheduled and escalating ativan doses per protocol. According to nurse and family at bedside patietn had not been to sleep since admission yesterday morning. One dose of haldol ordered after review of prior EKG to assess QT and patient has been placed on telemetry due to withdrawal symptoms and multiple medications that could impact QT

## 2019-07-13 NOTE — Progress Notes (Signed)
Pt arrived on unit at this time. Pt is confused and trying to climb out of bed. VSS on room air. Precedex gtt started. Sitter at bedside. Daughter updated.

## 2019-07-13 NOTE — Progress Notes (Signed)
PROGRESS NOTE    Ivan Hamilton  [id number] DOB: [birth year] DOA: 07/11/2019 PCP: Tracie Harrier, MD       Assessment & Plan:   Principal Problem:   Abdominal pain Active Problems:   Hypothyroid   Gastric reflux   Hypokalemia   Depression   Syncope   Alcohol abuse   Thrombocytopenia (HCC)   Normocytic anemia   Tobacco abuse   CVA (cerebral vascular accident) ([place])   HTN (hypertension)   Diverticulitis   Transaminitis   Alcohol w/drawal: continue on CIWA protocol. Moved to stepdown and started on precedex drip. Continue on tele. Alcohol cessation counseling. Last drink as per pt was Wednesday. Drinks 1/2 pint of tequila daily for many years  Abdominal pain: etiology unclear, possible diverticulitis as per CT. Continue on IV cipro & flagyl. Blood cxs NGTD. Zofran prn for nausea/vomiting. Morphine prn for pain. Continue on IVFs.   Normocytic anemia: recent drop in H&H form 14.5 on 09/23/18 to 9.7. C/o melena in recent past weeks. Endoscopy shows grade I esophageal varices, portal HTN gastropathy.  Supposed to go for colonoscopy today but canceled to pt having alcohol w/drawal. 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 Likely secondary to alcohol abuse. Will continue to monitor.   Leukopenia: likely secondary to bone marrow suppression from alcohol abuse. Will continue to monitor   GERD: continue on IV protonix   Depression: severity unknown. Will continue on home dose of buspirone & fluoxetine.  Anxiety: severity unknown. Will continue on hydroxyzine prn   Hypothyroidism: continue on home dose of levothyroxine  HTN: continue on home dose of prazosin & propanolol  Hypokalemia: WNL today. Will continue to monitor  Syncope: etiology unclear, ddx CVA vs alcohol abuse. Given history of stroke, will need to r/o CVA. MRI brain shows no acute  findings. Echo pending.   Thrombocytopenia: likely secondary to chronic liver disease.Will continue to monitor   Hx of CVA: not taking meds at home. MRI brain is neg for acute findings    DVT prophylaxis:  SCDs Code Status:  Full  Family Communication:  Disposition Plan:    Consultants:   GI   Procedures: endoscopy today    Antimicrobials: cipro & flagyl    Subjective: Pt c/o being hungry and abd pain.   Objective: Vitals:   07/12/19 2301 07/13/19 0048 07/13/19 0302 07/13/19 0650  BP: 99/83 98/78 (!) 110/97 112/76  Pulse: 69 67 86 (!) 57  Resp: 18 16  20   Temp: (!) 97.5 F (36.4 C) 97.9 F (36.6 C)  98.3 F (36.8 C)  TempSrc: Oral Oral  Oral  SpO2: 100% 97%  94%  Weight:      Height:        Intake/Output Summary (Last 24 hours) at 07/13/2019 0720 Last data filed at 07/12/2019 2046 Gross per 24 hour  Intake 1007.27 ml  Output 800 ml  Net 207.27 ml   Filed Weights   07/11/19 0339  Weight: 62.1 kg    Examination:  General exam: Appears restless and agitated Respiratory system: decreased breath sounds b/l. No wheezes Cardiovascular system: S1 & S2 +. No  rubs, gallops or clicks.  Gastrointestinal system: Abdomen is nondistended, soft. Mild tenderness to palpation  Normal bowel sounds heard. Psych: agitated and restless    Data Reviewed: I have personally reviewed following labs and imaging studies  CBC: Recent Labs  Lab 07/11/19 0352 07/11/19 1835 07/13/19 0158  WBC 5.3 5.2 3.8*  HGB 9.7* 10.0* 9.7*  HCT 26.6* 28.9* 29.3*  MCV 90.8 94.4 100.0  PLT 70* 82* 89*   Basic Metabolic Panel: Recent Labs  Lab 07/11/19 0352 07/11/19 1835 07/12/19 0701 07/13/19 0158  NA 134* 136 135 141  K 3.2* 3.4* 3.2* 3.8  CL 95* 99 100 110  CO2 26 25 24  21*  GLUCOSE 133* 134* 105* 104*  BUN <5* <5* <5* <5*  CREATININE 0.62 0.60* 0.69 0.57*  CALCIUM 8.5* 8.4* 8.0* 8.2*  MG  --  1.4*  --  1.7  PHOS  --  1.6*  --   --    GFR: Estimated Creatinine  Clearance: 90.8 mL/min (A) (by C-G formula based on SCr of 0.57 mg/dL (L)). Liver Function Tests: Recent Labs  Lab 07/11/19 0352 07/11/19 1835 07/12/19 0701 07/13/19 0158  AST 189* 172* 138* 138*  ALT 47* 45* 38 36  ALKPHOS 134* 127* 114 107  BILITOT 3.6* 4.1* 4.2* 4.9*  PROT 7.1 7.1 6.5 6.7  ALBUMIN 3.3* 3.2* 2.8* 2.8*   Recent Labs  Lab 07/11/19 0352  LIPASE 35   Recent Labs  Lab 07/11/19 1835  AMMONIA 51*   Coagulation Profile: Recent Labs  Lab 07/11/19 1056  INR 1.2   Cardiac Enzymes: No results for input(s): CKTOTAL, CKMB, CKMBINDEX, TROPONINI in the last 168 hours. BNP (last 3 results) No results for input(s): PROBNP in the last 8760 hours. HbA1C: No results for input(s): HGBA1C in the last 72 hours. CBG: Recent Labs  Lab 07/12/19 0808  GLUCAP 94   Lipid Profile: No results for input(s): CHOL, HDL, LDLCALC, TRIG, CHOLHDL, LDLDIRECT in the last 72 hours. Thyroid Function Tests: No results for input(s): TSH, T4TOTAL, FREET4, T3FREE, THYROIDAB in the last 72 hours. Anemia Panel: No results for input(s): VITAMINB12, FOLATE, FERRITIN, TIBC, IRON, RETICCTPCT in the last 72 hours. Sepsis Labs: No results for input(s): PROCALCITON, LATICACIDVEN in the last 168 hours.  Recent Results (from the past 240 hour(s))  SARS CORONAVIRUS 2 (TAT 6-24 HRS) Nasopharyngeal Nasopharyngeal Swab     Status: None   Collection Time: 07/11/19 11:22 AM   Specimen: Nasopharyngeal Swab  Result Value Ref Range Status   SARS Coronavirus 2 NEGATIVE NEGATIVE Final    Comment: (NOTE) SARS-CoV-2 target nucleic acids are NOT DETECTED. The SARS-CoV-2 RNA is generally detectable in upper and lower respiratory specimens during the acute phase of infection. Negative results do not preclude SARS-CoV-2 infection, do not rule out co-infections with other pathogens, and should not be used as the sole basis for treatment or other patient management decisions. Negative results must be combined  with clinical observations, patient history, and epidemiological information. The expected result is Negative. Fact Sheet for Patients: SugarRoll.be Fact Sheet for Healthcare Providers: https://www.woods-mathews.com/ This test is not yet approved or cleared by the [place] FDA and  has been authorized for detection and/or diagnosis of SARS-CoV-2 by FDA under an Emergency Use Authorization (EUA). This EUA will remain  in effect (meaning this test can be used) for the duration of the COVID-19 declaration under Section 56 4(b)(1) of the Act, 21 U.S.C. section 360bbb-3(b)(1), unless the authorization is terminated or revoked sooner. Performed at [facility], [place] [address], [place], [place] [address]   CULTURE, BLOOD (ROUTINE X 2) w Reflex to ID Panel     Status: None (Preliminary result)   Collection Time: 07/11/19  1:45 PM   Specimen: BLOOD  Result Value Ref Range Status   Specimen Description BLOOD BLOOD RIGHT FOREARM  Final   Special Requests   Final    BOTTLES DRAWN AEROBIC AND ANAEROBIC Blood Culture adequate volume   Culture   Final    NO GROWTH < 24 HOURS Performed at [facility], [address] [place], [place] [address]    Report Status PENDING  Incomplete  CULTURE, BLOOD (ROUTINE X 2) w Reflex to ID Panel     Status: None (Preliminary result)   Collection Time: 07/11/19  1:45 PM   Specimen: BLOOD  Result Value Ref Range Status   Specimen Description BLOOD BLOOD LEFT FOREARM  Final   Special Requests   Final    BOTTLES DRAWN AEROBIC AND ANAEROBIC Blood Culture adequate volume   Culture   Final    NO GROWTH < 24 HOURS Performed at [facility], [address] [place], [place] [address]    Report Status PENDING  Incomplete         Radiology Studies: MR BRAIN WO CONTRAST  Result Date: 07/11/2019 CLINICAL DATA:  Initial evaluation for acute syncope/presyncope. EXAM: MRI HEAD WITHOUT  CONTRAST TECHNIQUE: Multiplanar, multiecho pulse sequences of the brain and surrounding structures were obtained without intravenous contrast. COMPARISON:  Prior MRI from 10/25/2017. FINDINGS: Brain: Mild cerebral atrophy for age. Small remote lacunar infarct versus small vascular lesion such as a capillary telangiectasia at the central pons, stable. No abnormal foci of restricted diffusion to suggest acute or subacute ischemia. Gray-white matter differentiation well maintained. No encephalomalacia to suggest chronic infarction. No foci of susceptibility artifact to suggest acute or chronic intracranial hemorrhage. No mass lesion, midline shift or mass effect. No hydrocephalus. No extra-axial fluid collection. Major dural sinuses are grossly patent. Pituitary gland and suprasellar region are normal. Midline structures intact and normal. Vascular: Major intracranial vascular flow voids well maintained and normal in appearance. Skull and upper cervical spine: Craniocervical junction normal. Visualized upper cervical spine within normal limits. Bone marrow signal intensity normal. No scalp soft tissue abnormality. Sinuses/Orbits: Globes and orbital soft tissues within normal limits. Paranasal sinuses are clear. No mastoid effusion. Inner ear structures normal. Other: None. IMPRESSION: 1. No acute intracranial abnormality. 2. Stable mild cerebral atrophy for age. Electronically Signed   By: Jeannine Boga M.D.   On: 07/11/2019 22:11   CT ABDOMEN PELVIS W CONTRAST  Result Date: 07/11/2019 CLINICAL DATA:  Abdominal abscess/infection suspected with right lower quadrant pain, pain under ribs. EXAM: CT ABDOMEN AND PELVIS WITH CONTRAST TECHNIQUE: Multidetector CT imaging of the abdomen and pelvis was performed using the standard protocol following bolus administration of intravenous contrast. CONTRAST:  170mL OMNIPAQUE IOHEXOL 300 MG/ML  SOLN COMPARISON:  06/16/2019 FINDINGS: Lower chest: Signs of basilar  atelectasis. No signs of consolidation or of pleural effusion. Hepatobiliary: Marked hepatic steatosis with lobular hepatic contours not changed from previous exam. Enhancement pattern suggests underlying fibrosis with heterogeneous lace-like appearance. No signs of biliary ductal distension following cholecystectomy. Pancreas: Pancreas is normal without focal lesion or ductal dilation. No peripancreatic inflammation. Spleen: Spleen with signs of mild enlargement, approximately 12 cm in greatest craniocaudal dimension but with perisplenic and perigastric collaterals in the setting of presumed liver disease. Adrenals/Urinary Tract: Adrenal glands are normal. Symmetric enhancement is noted of the bilateral kidneys without signs of hydronephrosis. Urinary bladder is normal. Stomach/Bowel: No signs of small bowel obstruction or acute gastric process. The appendix is seen in the right lower quadrant and is normal. There is generalized edema  about the bowel in this patient with signs of portal hypertension. Smiley asymmetric stranding noted about the descending colon. Marked colonic diverticulosis in the sigmoid colon. Vascular/Lymphatic: Patent portal vein. Signs of portosystemic collaterals in the upper abdomen about the stomach and spleen. Small left splenorenal shunt. Signs of calcific atherosclerotic changes, minimal in in the abdominal aorta. No signs of abdominal lymphadenopathy or upper abdominal lymphadenopathy. No signs of pelvic lymphadenopathy. Reproductive: Prostate grossly normal by CT. Other: No abdominal wall hernia or abnormality. No abdominopelvic ascites. Musculoskeletal: No sign of acute bone finding or destructive bone process. IMPRESSION: 1. Smiley asymmetric stranding about the descending colon, which may represent a focal area of diverticulitis, also some mild thickening of the ascending colon which is generalized could be related to early portal colopathy. 2. Signs of liver disease with evidence  of portosystemic collaterals in the upper abdomen. Constellation of findings on today's study including some increase in generalized and bowel edema may reflect early hepatic decompensation of existing liver disease. Clinical correlation is suggested. 3. Splenomegaly with portosystemic collaterals in the upper abdomen, consistent with portal hypertension. 4. Aortic atherosclerosis. Aortic Atherosclerosis (ICD10-I70.0). Electronically Signed   By: Zetta Bills M.D.   On: 07/11/2019 10:30   DG Chest Portable 1 View  Result Date: 07/11/2019 CLINICAL DATA:  Near syncope, nausea, dizziness and chest tightness. EXAM: PORTABLE CHEST 1 VIEW COMPARISON:  08/11/2014 FINDINGS: The heart size and mediastinal contours are within normal limits. There is no evidence of pulmonary edema, consolidation, pneumothorax, nodule or pleural fluid. The visualized skeletal structures are unremarkable. IMPRESSION: No active disease. Electronically Signed   By: Aletta Edouard M.D.   On: 07/11/2019 09:38   ECHOCARDIOGRAM COMPLETE  Result Date: 07/12/2019   ECHOCARDIOGRAM REPORT   Patient Name:   DARYLE BAILY Date of Exam: 07/12/2019 Medical Rec #:  [id number]        Height:       65.0 in Accession #:    [id number]       Weight:       137.0 lb Date of Birth:  23-Mar-1966        BSA:          1.68 m Patient Age:    53 years         BP:           90/68 mmHg Patient Gender: M                HR:           87 bpm. Exam Location:  [facility] Procedure: 2D Echo Indications:     Syncope 780.2/R55  History:         Patient has prior history of Echocardiogram examinations, most                  recent 10/23/2017. Stroke; Risk Factors:Hypertension.  Sonographer:     Avanell Shackleton Referring Phys:  Unknown Foley NIU Diagnosing Phys: Skeet Latch MD IMPRESSIONS  1. Left ventricular ejection fraction, by visual estimation, is 60 to 65%. The left ventricle has normal function. Left ventricular septal wall thickness was normal. Normal left ventricular  posterior wall thickness. There is no left ventricular hypertrophy.  2. Left ventricular diastolic parameters are consistent with Grade I diastolic dysfunction (impaired relaxation).  3. The left ventricle has no regional wall motion abnormalities.  4. Global right ventricle has normal systolic function.The right ventricular size is normal. No increase in right ventricular wall thickness.  5. Left atrial size  was normal.  6. Right atrial size was normal.  7. The mitral valve is normal in structure. Trivial mitral valve regurgitation. No evidence of mitral stenosis.  8. The tricuspid valve is normal in structure. Tricuspid valve regurgitation is mild.  9. The aortic valve is normal in structure. Aortic valve regurgitation is not visualized. No evidence of aortic valve sclerosis or stenosis. 10. Pulmonic regurgitation is mild. 11. The pulmonic valve was normal in structure. Pulmonic valve regurgitation is mild. 12. Mildly elevated pulmonary artery systolic pressure. 13. The inferior vena cava is normal in size with greater than 50% respiratory variability, suggesting right atrial pressure of 3 mmHg. FINDINGS  Left Ventricle: Left ventricular ejection fraction, by visual estimation, is 60 to 65%. The left ventricle has normal function. The left ventricle has no regional wall motion abnormalities. Normal left ventricular posterior wall thickness. There is no left ventricular hypertrophy. Left ventricular diastolic parameters are consistent with Grade I diastolic dysfunction (impaired relaxation). Normal left atrial pressure. Right Ventricle: The right ventricular size is normal. No increase in right ventricular wall thickness. Global RV systolic function is has normal systolic function. The tricuspid regurgitant velocity is 2.37 m/s, and with an assumed right atrial pressure  of 10 mmHg, the estimated right ventricular systolic pressure is mildly elevated at 32.5 mmHg. Left Atrium: Left atrial size was normal in size.  Right Atrium: Right atrial size was normal in size Pericardium: There is no evidence of pericardial effusion. Mitral Valve: The mitral valve is normal in structure. Trivial mitral valve regurgitation. No evidence of mitral valve stenosis by observation. Tricuspid Valve: The tricuspid valve is normal in structure. Tricuspid valve regurgitation is mild. Aortic Valve: The aortic valve is normal in structure. Aortic valve regurgitation is not visualized. The aortic valve is structurally normal, with no evidence of sclerosis or stenosis. Pulmonic Valve: The pulmonic valve was normal in structure. Pulmonic valve regurgitation is mild. Pulmonic regurgitation is mild. Aorta: The aortic root, ascending aorta and aortic arch are all structurally normal, with no evidence of dilitation or obstruction. Venous: The inferior vena cava is normal in size with greater than 50% respiratory variability, suggesting right atrial pressure of 3 mmHg. IAS/Shunts: No atrial level shunt detected by color flow Doppler. There is no evidence of a patent foramen ovale. No ventricular septal defect is seen or detected. There is no evidence of an atrial septal defect.  LEFT VENTRICLE PLAX 2D LVIDd:         4.48 cm  Diastology LVIDs:         2.55 cm  LV e' lateral:   14.30 cm/s LV PW:         0.68 cm  LV E/e' lateral: 4.0 LV IVS:        0.72 cm  LV e' medial:    8.49 cm/s LVOT diam:     2.10 cm  LV E/e' medial:  6.8 LV SV:         68 ml LV SV Index:   40.19 LVOT Area:     3.46 cm  RIGHT VENTRICLE            IVC RV S prime:     8.38 cm/s  IVC diam: 1.46 cm TAPSE (M-mode): 1.7 cm LEFT ATRIUM             Index       RIGHT ATRIUM          Index LA diam:        4.10  cm 2.43 cm/m  RA Area:     9.26 cm LA Vol (A2C):   44.8 ml 26.60 ml/m RA Volume:   15.10 ml 8.97 ml/m LA Vol (A4C):   39.5 ml 23.45 ml/m LA Biplane Vol: 42.9 ml 25.47 ml/m   AORTA Ao Root diam: 3.70 cm MITRAL VALVE                        TRICUSPID VALVE MV Area (PHT): 2.99 cm              TR Peak grad:   22.5 mmHg MV PHT:        73.66 msec           TR Vmax:        246.00 cm/s MV Decel Time: 254 msec MV E velocity: 57.70 cm/s 103 cm/s  SHUNTS MV A velocity: 73.70 cm/s 70.3 cm/s Systemic Diam: 2.10 cm MV E/A ratio:  0.78       1.5  Skeet Latch MD Electronically signed by Skeet Latch MD Signature Date/Time: 07/12/2019/3:16:33 PM    Final         Scheduled Meds: . busPIRone  15 mg Oral BID  . FLUoxetine  40 mg Oral Daily  . folic acid  1 mg Oral Daily  . gabapentin  600 mg Oral TID  . levothyroxine  125 mcg Oral QAC breakfast  . LORazepam  0-4 mg Intravenous Q6H   Followed by  . LORazepam  0-4 mg Intravenous Q12H  . multivitamin with minerals  1 tablet Oral Daily  . pantoprazole (PROTONIX) IV  40 mg Intravenous Q12H  . phosphorus  500 mg Oral QID  . prazosin  1 mg Oral QHS  . propranolol  10 mg Oral BID  . thiamine  100 mg Oral Daily   Or  . thiamine  100 mg Intravenous Daily   Continuous Infusions: . sodium chloride 75 mL/hr at 07/11/19 1448  . sodium chloride    . ciprofloxacin 400 mg (07/12/19 2245)  . metronidazole 500 mg (07/13/19 0403)     LOS: 2 days    Time spent: 33 mins    Wyvonnia Dusky, MD [facility] Pager [phone]  If 7PM-7AM, please contact night-coverage [email or web address] Password [email or web address] 07/13/2019, 7:20 AM

## 2019-07-13 NOTE — Progress Notes (Addendum)
As of 2300 on 12/20:  As shift progressed patient became increasingly restless, agitated, and compulsive. Hallucinations (visual and auditory) became continuous. Patient became more confused and daughter stated "his words make no sense." I was not able to assess this as patient was speaking [place]. He went from being A/O x4 to A/O x0. CIWA score progressed from 27 at 0017 to a 40 at 0255, but VS remained stable. He tried to pull his IV out several times and mitts were placed. He continually tried to get out of bed and staff were in his room often. The daughter remained at bedside for the entirety of the shift and was very helpful with trying to re-orient the patient and involved in his care. From 2319 - 0122, a total of 10mg  ativan was given with no relief from symptoms. Sharion Settler, NP was frequently updated on patients progression through textpage and phone calls throughout the shift and at 0330 she came to the room to visualize the patient. 5mg  Haldol was given at 0329. NP stated to stop giving the patient ativian at this time thinking the ativan was making him worse. Patient fell asleep around 0430. Will continue to monitor.    Patient was only able to drink half the bowel prep before midnight.

## 2019-07-13 NOTE — Consult Note (Signed)
Name: Ivan Hamilton MRN: [id number] DOB: [birth year]     CONSULTATION DATE: 07/11/2019  REFERRING MD :  Jimmye Norman  CHIEF COMPLAINT:  ETOH withdrawal  HISTORY OF PRESENT ILLNESS:   Ivan Hamilton is a 53 y.o. male with medical history significant of stroke, hypothyroidism, depression, alcoholism, PTSD, hypertension, who presents with abdominal pain, syncope.  Patient states that he has been having abdominal pain the past several days Abdominal pain is located in the right side of abdomen, RUQ and epigastric area, constant, moderate, dull, nonradiating.  Last drink last Thursday, drinks 1/2 pint of tequila daily for many years, no illegal drugs.   GI consulted, s/p EGD 12/20 +esoph varicies Admitted to [facility] for severe ETOH withdrawal-requiring precedex infusion precedex infusion seems to be helping     PAST MEDICAL HISTORY :   has a past medical history of Alcohol withdrawal ([place]), B12 deficiency, Chronic pain, CVA (cerebral vascular accident) ([place]), Hypertension, Hypothyroidism, and Varicose veins.  has a past surgical history that includes Leg Surgery; Esophagogastroduodenoscopy (egd) with propofol (N/A, 10/24/2017); Cholecystectomy; and Esophagogastroduodenoscopy (egd) with propofol (N/A, 07/12/2019). Prior to Admission medications   Medication Sig Start Date End Date Taking? Authorizing Provider  busPIRone (BUSPAR) 15 MG tablet Take 1 tablet (15 mg total) by mouth 2 (two) times daily. 09/24/18  Yes Lavella Hammock, MD  FLUoxetine (PROZAC) 40 MG capsule Take 1 capsule (40 mg total) by mouth daily. 09/24/18  Yes Lavella Hammock, MD  gabapentin (NEURONTIN) 300 MG capsule Take 2 capsules (600 mg total) by mouth 3 (three) times daily. 09/24/18  Yes Lavella Hammock, MD  hydrOXYzine (ATARAX/VISTARIL) 25 MG tablet Take 25 mg by mouth every 8 (eight) hours as needed for anxiety. 01/28/19  Yes [provider]  levothyroxine (SYNTHROID, LEVOTHROID) 125 MCG tablet Take 1 tablet (125 mcg  total) by mouth daily before breakfast. 09/24/18  Yes Lavella Hammock, MD  pantoprazole (PROTONIX) 40 MG tablet Take 40 mg by mouth 2 (two) times daily. 01/28/19  Yes [provider]  prazosin (MINIPRESS) 1 MG capsule Take 1 capsule (1 mg total) by mouth at bedtime. 09/24/18  Yes Lavella Hammock, MD  propranolol (INDERAL) 10 MG tablet Take 1 tablet (10 mg total) by mouth 2 (two) times daily as needed. 09/24/18  Yes Lavella Hammock, MD  traZODone (DESYREL) 50 MG tablet Take 100 mg by mouth at bedtime as needed for sleep. 06/01/19  Yes [provider]   No Known Allergies  FAMILY HISTORY:  family history is not on file. SOCIAL HISTORY:  reports that he has never smoked. He has never used smokeless tobacco. He reports current alcohol use. He reports that he does not use drugs.  REVIEW OF SYSTEMS:   Unable to obtain due to ETOH withdrawal     VITAL SIGNS: Temp:  [97.4 F (36.3 C)-98.5 F (36.9 C)] 98.3 F (36.8 C) (12/21 1400) Pulse Rate:  [57-115] 57 (12/21 1400) Resp:  [12-22] 16 (12/21 1400) BP: (85-127)/(65-99) 117/83 (12/21 1400) SpO2:  [94 %-100 %] 98 % (12/21 1400) Weight:  [57.6 kg] 57.6 kg (12/21 0900)   I/O last 3 completed shifts: In: 1007.3 [I.V.:300; IV Piggyback:707.3] Out: 2100 [Urine:2100] Total I/O In: 2793.5 [I.V.:2239; IV Piggyback:554.5] Out: 0    SpO2: 98 % O2 Flow Rate (L/min): 2 L/min   Physical Examination:  GENERAL:resting on precedex HEAD: Normocephalic, atraumatic.  EYES: Pupils equal, round, reactive to light.  No scleral icterus.  MOUTH: Moist mucosal membrane. NECK: Supple. No JVD.  PULMONARY: +rhonchi,  CARDIOVASCULAR: S1 and S2. Regular rate and rhythm. No murmurs, rubs, or gallops.  GASTROINTESTINAL: Soft, nontender, -distended. No masses. Positive bowel sounds. No hepatosplenomegaly.  MUSCULOSKELETAL: No swelling, clubbing, or edema.  SKIN:intact,warm,dry   MEDICATIONS: I have reviewed all medications and confirmed  regimen as documented   CULTURE RESULTS   Recent Results (from the past 240 hour(s))  SARS CORONAVIRUS 2 (TAT 6-24 HRS) Nasopharyngeal Nasopharyngeal Swab     Status: None   Collection Time: 07/11/19 11:22 AM   Specimen: Nasopharyngeal Swab  Result Value Ref Range Status   SARS Coronavirus 2 NEGATIVE NEGATIVE Final    Comment: (NOTE) SARS-CoV-2 target nucleic acids are NOT DETECTED. The SARS-CoV-2 RNA is generally detectable in upper and lower respiratory specimens during the acute phase of infection. Negative results do not preclude SARS-CoV-2 infection, do not rule out co-infections with other pathogens, and should not be used as the sole basis for treatment or other patient management decisions. Negative results must be combined with clinical observations, patient history, and epidemiological information. The expected result is Negative. Fact Sheet for Patients: SugarRoll.be Fact Sheet for Healthcare Providers: https://www.woods-mathews.com/ This test is not yet approved or cleared by the [place] FDA and  has been authorized for detection and/or diagnosis of SARS-CoV-2 by FDA under an Emergency Use Authorization (EUA). This EUA will remain  in effect (meaning this test can be used) for the duration of the COVID-19 declaration under Section 56 4(b)(1) of the Act, 21 U.S.C. section 360bbb-3(b)(1), unless the authorization is terminated or revoked sooner. Performed at [facility], [place] [address] [place], [place] [address]   CULTURE, BLOOD (ROUTINE X 2) w Reflex to ID Panel     Status: None (Preliminary result)   Collection Time: 07/11/19  1:45 PM   Specimen: BLOOD  Result Value Ref Range Status   Specimen Description BLOOD BLOOD RIGHT FOREARM  Final   Special Requests   Final    BOTTLES DRAWN AEROBIC AND ANAEROBIC Blood Culture adequate volume   Culture   Final    NO GROWTH 2 DAYS Performed at [facility],  [address], [place], [place] [address]    Report Status PENDING  Incomplete  CULTURE, BLOOD (ROUTINE X 2) w Reflex to ID Panel     Status: None (Preliminary result)   Collection Time: 07/11/19  1:45 PM   Specimen: BLOOD  Result Value Ref Range Status   Specimen Description BLOOD BLOOD LEFT FOREARM  Final   Special Requests   Final    BOTTLES DRAWN AEROBIC AND ANAEROBIC Blood Culture adequate volume   Culture   Final    NO GROWTH 2 DAYS Performed at [facility], [address] [place], [place] [address]    Report Status PENDING  Incomplete  MRSA PCR Screening     Status: Abnormal   Collection Time: 07/13/19  9:15 AM   Specimen: Nasal Mucosa; Nasopharyngeal  Result Value Ref Range Status   MRSA by PCR POSITIVE (A) NEGATIVE Final    Comment:        The GeneXpert MRSA Assay (FDA approved for NASAL specimens only), is one component of a comprehensive MRSA colonization surveillance program. It is not intended to diagnose MRSA infection nor to guide or monitor treatment for MRSA infections. RESULT CALLED TO, READ BACK BY AND VERIFIED WITH: MEGAN SHEFFIELD ON 07/13/2019 AT 1040 QSD Performed at [facility], [address] [place], [place] [address]  ASSESSMENT AND PLAN SYNOPSIS  SEVERE ALCOHOL WITHDRAWAL -Therapy with Thiamine and MVI -CIWA Protocol -Precedex as needed -High risk for intubation  -high risk for aspiration  CARDIAC ICU monitoring  GI GI PROPHYLAXIS as indicated  NUTRITIONAL STATUS DIET-->NPO Constipation protocol as indicated +esoph varices Follow up GI recs   ENDO - will use ICU hypoglycemic\Hyperglycemia protocol if needed    ELECTROLYTES -follow labs as needed -replace as needed -pharmacy consultation and following   DVT/GI PRX ordered TRANSFUSIONS AS NEEDED MONITOR FSBS ASSESS the need for LABS    Corrin Parker, M.D.  [facility]  Medical Director  [place] Director [place] Department

## 2019-07-13 NOTE — Progress Notes (Signed)
Ivan Lame, MD [facility]   [address], [place], [place] [address] Phone: [phone] Fax : [phone]   Subjective: The patient is sedated and not able to answer any questions.  The patient was sent to the ICU for alcohol withdrawal.  Patient had a history of grade 1 esophageal varices on his upper endoscopy.  The patient was supposed to have a colonoscopy today but that was canceled since he was brought to the ICU.  The patient's hemoglobin from 2 days ago was 10.0 and it was 9.7 today.   Objective: Vital signs in last 24 hours: Vitals:   07/13/19 1200 07/13/19 1300 07/13/19 1400 07/13/19 1500  BP: 108/75 118/81 117/83 120/84  Pulse: 64 62 (!) 57 (!) 59  Resp:   16   Temp:   98.3 F (36.8 C)   TempSrc:   Axillary   SpO2: 97% 98% 98% 99%  Weight:      Height:       Weight change:   Intake/Output Summary (Last 24 hours) at 07/13/2019 1634 Last data filed at 07/13/2019 1411 Gross per 24 hour  Intake 3500.78 ml  Output 0 ml  Net 3500.78 ml     Exam: Heart:: Regular rate and rhythm, S1S2 present or without murmur or extra heart sounds Lungs: normal and clear to auscultation and percussion Abdomen: soft, nontender, normal bowel sounds   Lab Results: @LABTEST2 @ Micro Results: Recent Results (from the past 240 hour(s))  SARS CORONAVIRUS 2 (TAT 6-24 HRS) Nasopharyngeal Nasopharyngeal Swab     Status: None   Collection Time: 07/11/19 11:22 AM   Specimen: Nasopharyngeal Swab  Result Value Ref Range Status   SARS Coronavirus 2 NEGATIVE NEGATIVE Final    Comment: (NOTE) SARS-CoV-2 target nucleic acids are NOT DETECTED. The SARS-CoV-2 RNA is generally detectable in upper and lower respiratory specimens during the acute phase of infection. Negative results do not preclude SARS-CoV-2 infection, do not rule out co-infections with other pathogens, and should not be used as the sole basis for treatment or other patient management decisions. Negative results must be  combined with clinical observations, patient history, and epidemiological information. The expected result is Negative. Fact Sheet for Patients: SugarRoll.be Fact Sheet for Healthcare Providers: https://www.woods-mathews.com/ This test is not yet approved or cleared by the [place] FDA and  has been authorized for detection and/or diagnosis of SARS-CoV-2 by FDA under an Emergency Use Authorization (EUA). This EUA will remain  in effect (meaning this test can be used) for the duration of the COVID-19 declaration under Section 56 4(b)(1) of the Act, 21 U.S.C. section 360bbb-3(b)(1), unless the authorization is terminated or revoked sooner. Performed at [facility], [place] [address], [place], [place] [address]   CULTURE, BLOOD (ROUTINE X 2) w Reflex to ID Panel     Status: None (Preliminary result)   Collection Time: 07/11/19  1:45 PM   Specimen: BLOOD  Result Value Ref Range Status   Specimen Description BLOOD BLOOD RIGHT FOREARM  Final   Special Requests   Final    BOTTLES DRAWN AEROBIC AND ANAEROBIC Blood Culture adequate volume   Culture   Final    NO GROWTH 2 DAYS Performed at [facility], [address] [place], [place] [address]    Report Status PENDING  Incomplete  CULTURE, BLOOD (ROUTINE X 2) w Reflex to ID Panel     Status: None (Preliminary result)   Collection Time: 07/11/19  1:45 PM   Specimen: BLOOD  Result  Value Ref Range Status   Specimen Description BLOOD BLOOD LEFT FOREARM  Final   Special Requests   Final    BOTTLES DRAWN AEROBIC AND ANAEROBIC Blood Culture adequate volume   Culture   Final    NO GROWTH 2 DAYS Performed at [facility], [address], [place], [place] [address]    Report Status PENDING  Incomplete  MRSA PCR Screening     Status: Abnormal   Collection Time: 07/13/19  9:15 AM   Specimen: Nasal Mucosa; Nasopharyngeal  Result Value Ref Range Status   MRSA by PCR POSITIVE  (A) NEGATIVE Final    Comment:        The GeneXpert MRSA Assay (FDA approved for NASAL specimens only), is one component of a comprehensive MRSA colonization surveillance program. It is not intended to diagnose MRSA infection nor to guide or monitor treatment for MRSA infections. RESULT CALLED TO, READ BACK BY AND VERIFIED WITH: MEGAN SHEFFIELD ON 07/13/2019 AT 1040 QSD Performed at [facility], [address] [place],  [address]    Studies/Results: MR BRAIN WO CONTRAST  Result Date: 07/11/2019 CLINICAL DATA:  Initial evaluation for acute syncope/presyncope. EXAM: MRI HEAD WITHOUT CONTRAST TECHNIQUE: Multiplanar, multiecho pulse sequences of the brain and surrounding structures were obtained without intravenous contrast. COMPARISON:  Prior MRI from 10/25/2017. FINDINGS: Brain: Mild cerebral atrophy for age. Small remote lacunar infarct versus small vascular lesion such as a capillary telangiectasia at the central pons, stable. No abnormal foci of restricted diffusion to suggest acute or subacute ischemia. Gray-white matter differentiation well maintained. No encephalomalacia to suggest chronic infarction. No foci of susceptibility artifact to suggest acute or chronic intracranial hemorrhage. No mass lesion, midline shift or mass effect. No hydrocephalus. No extra-axial fluid collection. Major dural sinuses are grossly patent. Pituitary gland and suprasellar region are normal. Midline structures intact and normal. Vascular: Major intracranial vascular flow voids well maintained and normal in appearance. Skull and upper cervical spine: Craniocervical junction normal. Visualized upper cervical spine within normal limits. Bone marrow signal intensity normal. No scalp soft tissue abnormality. Sinuses/Orbits: Globes and orbital soft tissues within normal limits. Paranasal sinuses are clear. No mastoid effusion. Inner ear structures normal. Other: None. IMPRESSION: 1. No acute  intracranial abnormality. 2. Stable mild cerebral atrophy for age. Electronically Signed   By: Jeannine Boga M.D.   On: 07/11/2019 22:11   ECHOCARDIOGRAM COMPLETE  Result Date: 07/12/2019   ECHOCARDIOGRAM REPORT   Patient Name:   Ivan Hamilton Date of Exam: 07/12/2019 Medical Rec #:  [id number]        Height:       65.0 in Accession #:    [id number]       Weight:       137.0 lb Date of Birth:  [birth year]        BSA:          1.68 m Patient Age:    53 years         BP:           90/68 mmHg Patient Gender: M                HR:           87 bpm. Exam Location:  [facility] Procedure: 2D Echo Indications:     Syncope 780.2/R55  History:         Patient has prior history of Echocardiogram examinations, most  recent 10/23/2017. Stroke; Risk Factors:Hypertension.  Sonographer:     Avanell Shackleton Referring Phys:  Unknown Foley NIU Diagnosing Phys: Skeet Latch MD IMPRESSIONS  1. Left ventricular ejection fraction, by visual estimation, is 60 to 65%. The left ventricle has normal function. Left ventricular septal wall thickness was normal. Normal left ventricular posterior wall thickness. There is no left ventricular hypertrophy.  2. Left ventricular diastolic parameters are consistent with Grade I diastolic dysfunction (impaired relaxation).  3. The left ventricle has no regional wall motion abnormalities.  4. Global right ventricle has normal systolic function.The right ventricular size is normal. No increase in right ventricular wall thickness.  5. Left atrial size was normal.  6. Right atrial size was normal.  7. The mitral valve is normal in structure. Trivial mitral valve regurgitation. No evidence of mitral stenosis.  8. The tricuspid valve is normal in structure. Tricuspid valve regurgitation is mild.  9. The aortic valve is normal in structure. Aortic valve regurgitation is not visualized. No evidence of aortic valve sclerosis or stenosis. 10. Pulmonic regurgitation is mild. 11. The pulmonic  valve was normal in structure. Pulmonic valve regurgitation is mild. 12. Mildly elevated pulmonary artery systolic pressure. 13. The inferior vena cava is normal in size with greater than 50% respiratory variability, suggesting right atrial pressure of 3 mmHg. FINDINGS  Left Ventricle: Left ventricular ejection fraction, by visual estimation, is 60 to 65%. The left ventricle has normal function. The left ventricle has no regional wall motion abnormalities. Normal left ventricular posterior wall thickness. There is no left ventricular hypertrophy. Left ventricular diastolic parameters are consistent with Grade I diastolic dysfunction (impaired relaxation). Normal left atrial pressure. Right Ventricle: The right ventricular size is normal. No increase in right ventricular wall thickness. Global RV systolic function is has normal systolic function. The tricuspid regurgitant velocity is 2.37 m/s, and with an assumed right atrial pressure  of 10 mmHg, the estimated right ventricular systolic pressure is mildly elevated at 32.5 mmHg. Left Atrium: Left atrial size was normal in size. Right Atrium: Right atrial size was normal in size Pericardium: There is no evidence of pericardial effusion. Mitral Valve: The mitral valve is normal in structure. Trivial mitral valve regurgitation. No evidence of mitral valve stenosis by observation. Tricuspid Valve: The tricuspid valve is normal in structure. Tricuspid valve regurgitation is mild. Aortic Valve: The aortic valve is normal in structure. Aortic valve regurgitation is not visualized. The aortic valve is structurally normal, with no evidence of sclerosis or stenosis. Pulmonic Valve: The pulmonic valve was normal in structure. Pulmonic valve regurgitation is mild. Pulmonic regurgitation is mild. Aorta: The aortic root, ascending aorta and aortic arch are all structurally normal, with no evidence of dilitation or obstruction. Venous: The inferior vena cava is normal in size with  greater than 50% respiratory variability, suggesting right atrial pressure of 3 mmHg. IAS/Shunts: No atrial level shunt detected by color flow Doppler. There is no evidence of a patent foramen ovale. No ventricular septal defect is seen or detected. There is no evidence of an atrial septal defect.  LEFT VENTRICLE PLAX 2D LVIDd:         4.48 cm  Diastology LVIDs:         2.55 cm  LV e' lateral:   14.30 cm/s LV PW:         0.68 cm  LV E/e' lateral: 4.0 LV IVS:        0.72 cm  LV e' medial:    8.49 cm/s  LVOT diam:     2.10 cm  LV E/e' medial:  6.8 LV SV:         68 ml LV SV Index:   40.19 LVOT Area:     3.46 cm  RIGHT VENTRICLE            IVC RV S prime:     8.38 cm/s  IVC diam: 1.46 cm TAPSE (M-mode): 1.7 cm LEFT ATRIUM             Index       RIGHT ATRIUM          Index LA diam:        4.10 cm 2.43 cm/m  RA Area:     9.26 cm LA Vol (A2C):   44.8 ml 26.60 ml/m RA Volume:   15.10 ml 8.97 ml/m LA Vol (A4C):   39.5 ml 23.45 ml/m LA Biplane Vol: 42.9 ml 25.47 ml/m   AORTA Ao Root diam: 3.70 cm MITRAL VALVE                        TRICUSPID VALVE MV Area (PHT): 2.99 cm             TR Peak grad:   22.5 mmHg MV PHT:        73.66 msec           TR Vmax:        246.00 cm/s MV Decel Time: 254 msec MV E velocity: 57.70 cm/s 103 cm/s  SHUNTS MV A velocity: 73.70 cm/s 70.3 cm/s Systemic Diam: 2.10 cm MV E/A ratio:  0.78       1.5  Skeet Latch MD Electronically signed by Skeet Latch MD Signature Date/Time: 07/12/2019/3:16:33 PM    Final    Medications: I have reviewed the patient's current medications. Scheduled Meds: . busPIRone  15 mg Oral BID  . Chlorhexidine Gluconate Cloth  6 each Topical Daily  . FLUoxetine  40 mg Oral Daily  . folic acid  1 mg Oral Daily  . gabapentin  600 mg Oral TID  . levothyroxine  125 mcg Oral QAC breakfast  . LORazepam  0-4 mg Intravenous Q6H   Followed by  . LORazepam  0-4 mg Intravenous Q12H  . multivitamin with minerals  1 tablet Oral Daily  . pantoprazole (PROTONIX)  IV  40 mg Intravenous Q12H  . phosphorus  500 mg Oral QID  . prazosin  1 mg Oral QHS  . propranolol  10 mg Oral BID  . thiamine  100 mg Oral Daily   Or  . thiamine  100 mg Intravenous Daily   Continuous Infusions: . sodium chloride 75 mL/hr at 07/13/19 1411  . sodium chloride    . ciprofloxacin Stopped (07/13/19 1137)  . dexmedetomidine (PRECEDEX) IV infusion 0.8 mcg/kg/hr (07/13/19 1556)  . metronidazole Stopped (07/13/19 1355)   PRN Meds:.acetaminophen, hydrOXYzine, LORazepam **OR** LORazepam, morphine injection, ondansetron (ZOFRAN) IV, traZODone   Assessment: Principal Problem:   Abdominal pain Active Problems:   Hypothyroid   Gastric reflux   Hypokalemia   Depression   Syncope   Alcohol abuse   Thrombocytopenia (HCC)   Normocytic anemia   Tobacco abuse   CVA (cerebral vascular accident) ([place])   HTN (hypertension)   Diverticulitis   Transaminitis    Plan: This patient has alcoholic liver disease with anemia and thrombocytopenia.  The patient had an upper endoscopy for melena with some varices seen.  The patient's liver  test show findings consistent with alcoholic hepatitis.  The patient bilirubin is also quite elevated and going up consistent with progressive dysfunction of the liver.  With the patient's hemoglobin remaining stable I would not recommend any GI intervention at this time.  I will follow along with you.   LOS: 2 days   Ivan Hamilton 07/13/2019, 4:34 PM Pager [phone] 7am-5pm  Check AMION for 5pm -7am coverage and on weekends

## 2019-07-14 DIAGNOSIS — E872 Acidosis, unspecified: Secondary | ICD-10-CM

## 2019-07-14 LAB — CBC
HCT: 33.4 % — ABNORMAL LOW (ref 39.0–52.0)
Hemoglobin: 11.2 g/dL — ABNORMAL LOW (ref 13.0–17.0)
MCH: 33.2 pg (ref 26.0–34.0)
MCHC: 33.5 g/dL (ref 30.0–36.0)
MCV: 99.1 fL (ref 80.0–100.0)
Platelets: 135 10*3/uL — ABNORMAL LOW (ref 150–400)
RBC: 3.37 MIL/uL — ABNORMAL LOW (ref 4.22–5.81)
RDW: 15.3 % (ref 11.5–15.5)
WBC: 4.3 10*3/uL (ref 4.0–10.5)
nRBC: 0 % (ref 0.0–0.2)

## 2019-07-14 LAB — COMPREHENSIVE METABOLIC PANEL
ALT: 39 U/L (ref 0–44)
AST: 142 U/L — ABNORMAL HIGH (ref 15–41)
Albumin: 2.8 g/dL — ABNORMAL LOW (ref 3.5–5.0)
Alkaline Phosphatase: 116 U/L (ref 38–126)
Anion gap: 11 (ref 5–15)
BUN: <5 mg/dL — ABNORMAL LOW (ref 6–20)
CO2: 18 mmol/L — ABNORMAL LOW (ref 22–32)
Calcium: 7.6 mg/dL — ABNORMAL LOW (ref 8.9–10.3)
Chloride: 106 mmol/L (ref 98–111)
Creatinine, Ser: 0.5 mg/dL — ABNORMAL LOW (ref 0.61–1.24)
GFR calc Af Amer: >60 mL/min (ref >60)
GFR calc non Af Amer: >60 mL/min (ref >60)
Glucose, Bld: 120 mg/dL — ABNORMAL HIGH (ref 70–99)
Potassium: 3.2 mmol/L — ABNORMAL LOW (ref 3.5–5.1)
Sodium: 135 mmol/L (ref 135–145)
Total Bilirubin: 4.5 mg/dL — ABNORMAL HIGH (ref 0.3–1.2)
Total Protein: 6.9 g/dL (ref 6.5–8.1)

## 2019-07-14 LAB — MAGNESIUM: Magnesium: 1.9 mg/dL (ref 1.7–2.4)

## 2019-07-14 LAB — PHOSPHORUS: Phosphorus: 2.6 mg/dL (ref 2.5–4.6)

## 2019-07-14 MED ORDER — SODIUM BICARBONATE-DEXTROSE 150-5 MEQ/L-% IV SOLN
150.0000 meq | INTRAVENOUS | Status: DC
  Administered 2019-07-14 – 2019-07-15 (×2): 150 meq via INTRAVENOUS
  Filled 2019-07-14 (×2): qty 1000

## 2019-07-14 MED ORDER — POTASSIUM CHLORIDE CRYS ER 20 MEQ PO TBCR
20.0000 meq | EXTENDED_RELEASE_TABLET | Freq: Once | ORAL | Status: AC
  Administered 2019-07-14: 20 meq via ORAL
  Filled 2019-07-14: qty 1

## 2019-07-14 MED ORDER — LACTATED RINGERS IV BOLUS
500.0000 mL | Freq: Once | INTRAVENOUS | Status: AC
  Administered 2019-07-14: 500 mL via INTRAVENOUS

## 2019-07-14 MED ORDER — LACTATED RINGERS IV BOLUS
250.0000 mL | Freq: Once | INTRAVENOUS | Status: AC
  Administered 2019-07-14: 250 mL via INTRAVENOUS

## 2019-07-14 NOTE — Progress Notes (Signed)
[name], MD [facility]   [address] [place], [place] [address] Phone: [phone] Fax : [phone]   Subjective: The patient is lethargic and lying in bed in no apparent distress.  The patient's hemoglobin has increased yesterday.  There is no report of any of acute GI bleeding.  Objective: Vital signs in last 24 hours: Vitals:   07/14/19 1100 07/14/19 1200 07/14/19 1300 07/14/19 1400  BP: 113/78 111/80 105/78 106/75  Pulse: (!) 51 (!) 57 (!) 58 (!) 55  Resp: 15 15 15 14   Temp:    98.9 F (37.2 C)  TempSrc:    Axillary  SpO2: 99% 100% 100% 99%  Weight:      Height:       Weight change:   Intake/Output Summary (Last 24 hours) at 07/14/2019 1456 Last data filed at 07/14/2019 1404 Gross per 24 hour  Intake 2444.69 ml  Output 1450 ml  Net 994.69 ml     Exam: Heart:: Regular rate and rhythm, S1S2 present or without murmur or extra heart sounds Lungs: normal and clear to auscultation and percussion Abdomen: soft, nontender, normal bowel sounds   Lab Results: @LABTEST2 @ Micro Results: Recent Results (from the past 240 hour(s))  SARS CORONAVIRUS 2 (TAT 6-24 HRS) Nasopharyngeal Nasopharyngeal Swab     Status: None   Collection Time: 07/11/19 11:22 AM   Specimen: Nasopharyngeal Swab  Result Value Ref Range Status   SARS Coronavirus 2 NEGATIVE NEGATIVE Final    Comment: (NOTE) SARS-CoV-2 target nucleic acids are NOT DETECTED. The SARS-CoV-2 RNA is generally detectable in upper and lower respiratory specimens during the acute phase of infection. Negative results do not preclude SARS-CoV-2 infection, do not rule out co-infections with other pathogens, and should not be used as the sole basis for treatment or other patient management decisions. Negative results must be combined with clinical observations, patient history, and epidemiological information. The expected result is Negative. Fact Sheet for Patients: SugarRoll.be  Fact Sheet for Healthcare Providers: https://www.woods-mathews.com/ This test is not yet approved or cleared by the [place] FDA and  has been authorized for detection and/or diagnosis of SARS-CoV-2 by FDA under an Emergency Use Authorization (EUA). This EUA will remain  in effect (meaning this test can be used) for the duration of the COVID-19 declaration under Section 56 4(b)(1) of the Act, 21 U.S.C. section 360bbb-3(b)(1), unless the authorization is terminated or revoked sooner. Performed at [facility], [place] [address], [place], [place] [address]   CULTURE, BLOOD (ROUTINE X 2) w Reflex to ID Panel     Status: None (Preliminary result)   Collection Time: 07/11/19  1:45 PM   Specimen: BLOOD  Result Value Ref Range Status   Specimen Description BLOOD BLOOD RIGHT FOREARM  Final   Special Requests   Final    BOTTLES DRAWN AEROBIC AND ANAEROBIC Blood Culture adequate volume   Culture   Final    NO GROWTH 3 DAYS Performed at [facility], [address] [place], [place] [address]    Report Status PENDING  Incomplete  CULTURE, BLOOD (ROUTINE X 2) w Reflex to ID Panel     Status: None (Preliminary result)   Collection Time: 07/11/19  1:45 PM   Specimen: BLOOD  Result Value Ref Range Status   Specimen Description BLOOD BLOOD LEFT FOREARM  Final   Special Requests   Final    BOTTLES DRAWN AEROBIC AND ANAEROBIC Blood Culture adequate volume   Culture   Final  NO GROWTH 3 DAYS Performed at [facility], [address] [place], [place] [address]    Report Status PENDING  Incomplete  MRSA PCR Screening     Status: Abnormal   Collection Time: 07/13/19  9:15 AM   Specimen: Nasal Mucosa; Nasopharyngeal  Result Value Ref Range Status   MRSA by PCR POSITIVE (A) NEGATIVE Final    Comment:        The GeneXpert MRSA Assay (FDA approved for NASAL specimens only), is one component of a comprehensive MRSA colonization surveillance program. It is  not intended to diagnose MRSA infection nor to guide or monitor treatment for MRSA infections. RESULT CALLED TO, READ BACK BY AND VERIFIED WITH: MEGAN SHEFFIELD ON 07/13/2019 AT 1040 QSD Performed at [facility], [address], [place], [place] [address]    Studies/Results: No results found. Medications: I have reviewed the patient's current medications. Scheduled Meds: . busPIRone  15 mg Oral BID  . Chlorhexidine Gluconate Cloth  6 each Topical Daily  . FLUoxetine  40 mg Oral Daily  . folic acid  1 mg Oral Daily  . gabapentin  600 mg Oral TID  . levothyroxine  125 mcg Oral QAC breakfast  . LORazepam  0-4 mg Intravenous Q12H  . multivitamin with minerals  1 tablet Oral Daily  . pantoprazole (PROTONIX) IV  40 mg Intravenous Q12H  . phosphorus  500 mg Oral QID  . prazosin  1 mg Oral QHS  . propranolol  10 mg Oral BID  . thiamine  100 mg Oral Daily   Or  . thiamine  100 mg Intravenous Daily   Continuous Infusions: . sodium chloride 75 mL/hr at 07/14/19 1404  . sodium chloride    . ciprofloxacin Stopped (07/14/19 1144)  . dexmedetomidine (PRECEDEX) IV infusion 0.8 mcg/kg/hr (07/14/19 1404)  . metronidazole 100 mL/hr at 07/14/19 1404   PRN Meds:.acetaminophen, hydrOXYzine, LORazepam **OR** LORazepam, morphine injection, ondansetron (ZOFRAN) IV, traZODone   Assessment: Principal Problem:   Abdominal pain Active Problems:   Hypothyroid   Gastric reflux   Hypokalemia   Depression   Syncope   Alcohol abuse   Thrombocytopenia (HCC)   Normocytic anemia   Tobacco abuse   CVA (cerebral vascular accident) ([place])   HTN (hypertension)   Diverticulitis   Transaminitis   Hepatic cirrhosis ([place])    Plan: Who admitted to the ICU in DTs with a history of cirrhosis and alcohol abuse.  Had a GI bleed with an upper endoscopy showing varices.  The patient had no further GI bleeding.  The patient's hemoglobin 3 days ago was 10.0 with a drop yesterday to 9.7 but his  hemoglobin today is 11.2.  There is no need for any GI intervention at this the patient's hemoglobin and I will not recommend any further procedures unless the patient does show signs of bleeding.   LOS: 3 days   [name] 07/14/2019, 2:56 PM Pager [phone] 7am-5pm  Check AMION for 5pm -7am coverage and on weekends

## 2019-07-14 NOTE — Progress Notes (Signed)
Took over care of patient from previous nurse. I agree with previous nurses assessment.

## 2019-07-14 NOTE — Progress Notes (Signed)
PROGRESS NOTE    Ivan Hamilton  [id number] DOB: Dec 29, 1965 DOA: 07/11/2019 PCP: Tracie Harrier, MD       Assessment & Plan:   Principal Problem:   Abdominal pain Active Problems:   Hypothyroid   Gastric reflux   Hypokalemia   Depression   Syncope   Alcohol abuse   Thrombocytopenia (HCC)   Normocytic anemia   Tobacco abuse   CVA (cerebral vascular accident) ([place])   HTN (hypertension)   Diverticulitis   Transaminitis   Hepatic cirrhosis ([place])   Alcohol w/drawal: continue on CIWA protocol. Continue on precedex drip. Continue on tele. Alcohol cessation counseling. Last drink as per pt was Wednesday. Drinks 1/2 pint of tequila daily for many years. Continue w/ sitter and mittens.   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 AST is elevated but ALT is WNL. GI following and recs apprec  Hyperbilirubinemia: secondary to alcohol abuse & alcoholic hepatitis. Trending down slightly today   Metabolic acidosis: will start IV bicarb drip  Abdominal pain: etiology unclear, possible diverticulitis as per CT. Continue on IV cipro & flagyl. Blood cxs NGTD. Zofran prn for nausea/vomiting. Morphine prn for pain. Continue on IVFs.   Normocytic anemia: recent drop in H&H form 14.5 on 09/23/18 to 9.7. C/o melena in recent past weeks. Endoscopy shows grade I esophageal varices, portal HTN gastropathy.  Supposed to go for colonoscopy 07/13/19 but canceled has pt having alcohol w/drawal. 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 Likely secondary to alcohol abuse. Will continue to monitor.   Leukopenia: resolved  GERD: continue on IV protonix   Depression: severity unknown. Will continue on home dose of buspirone & fluoxetine.  Anxiety: severity unknown. Will continue on hydroxyzine prn   Hypothyroidism: continue on home dose of levothyroxine  HTN:  continue on home dose of prazosin & propanolol  Hypokalemia: WNL today. Will continue to monitor  Syncope: etiology unclear, ddx CVA vs alcohol abuse. Given history of stroke, will need to r/o CVA. MRI brain shows no acute findings. Echo shows EF [id number], grade I diastolic dysfunction.   Thrombocytopenia: likely secondary to bone marrow suppression from alcohol abuse.Will continue to monitor   Hx of CVA: not taking meds at home. MRI brain is neg for acute findings    DVT prophylaxis:  SCDs Code Status:  Full  Family Communication:  Disposition Plan:    Consultants:   GI   Procedures: endoscopy today    Antimicrobials: cipro & flagyl    Subjective: Pt is very lethargic and does not answer my questions.   Objective: Vitals:   07/14/19 0500 07/14/19 0600 07/14/19 0700 07/14/19 0800  BP: 123/74 (!) 133/94 120/80 118/75  Pulse: 62 (!) 58 (!) 56 (!) 55  Resp: 19 19 18 19   Temp:    98.4 F (36.9 C)  TempSrc:    Oral  SpO2: 100% 100% 98% 99%  Weight:      Height:        Intake/Output Summary (Last 24 hours) at 07/14/2019 0914 Last data filed at 07/14/2019 0827 Gross per 24 hour  Intake 4506.15 ml  Output 1250 ml  Net 3256.15 ml   Filed Weights   07/11/19 0339 07/13/19 0900  Weight: 62.1 kg 57.6 kg    Examination:  General exam: Lethargic but comfortable  Respiratory system: diminished breath sounds b/l. No wheezes, rales  Cardiovascular system: S1 &  S2 +. No  rubs, gallops or clicks.  Gastrointestinal system: Abdomen is nondistended, soft, NT. Hypoactive bowel sounds heard. Psych: lethargic. Flat mood and affect     Data Reviewed: I have personally reviewed following labs and imaging studies  CBC: Recent Labs  Lab 07/11/19 0352 07/11/19 1835 07/13/19 0158 07/14/19 0708  WBC 5.3 5.2 3.8* 4.3  HGB 9.7* 10.0* 9.7* 11.2*  HCT 26.6* 28.9* 29.3* 33.4*  MCV 90.8 94.4 100.0 99.1  PLT 70* 82* 89* [id number]*   Basic Metabolic Panel: Recent Labs  Lab  07/11/19 0352 07/11/19 1835 07/12/19 0701 07/13/19 0158 07/14/19 0708  NA 134* 136 135 141 135  K 3.2* 3.4* 3.2* 3.8 3.2*  CL 95* 99 100 110 106  CO2 26 25 24  21* 18*  GLUCOSE 133* 134* 105* 104* 120*  BUN <5* <5* <5* <5* <5*  CREATININE 0.62 0.60* 0.69 0.57* 0.50*  CALCIUM 8.5* 8.4* 8.0* 8.2* 7.6*  MG  --  1.4*  --  1.7 1.9  PHOS  --  1.6*  --   --  2.6   GFR: Estimated Creatinine Clearance: 85 mL/min (A) (by C-G formula based on SCr of 0.5 mg/dL (L)). Liver Function Tests: Recent Labs  Lab 07/11/19 0352 07/11/19 1835 07/12/19 0701 07/13/19 0158 07/14/19 0708  AST 189* 172* 138* 138* 142*  ALT 47* 45* 38 36 39  ALKPHOS 134* 127* 114 107 116  BILITOT 3.6* 4.1* 4.2* 4.9* 4.5*  PROT 7.1 7.1 6.5 6.7 6.9  ALBUMIN 3.3* 3.2* 2.8* 2.8* 2.8*   Recent Labs  Lab 07/11/19 0352  LIPASE 35   Recent Labs  Lab 07/11/19 1835  AMMONIA 51*   Coagulation Profile: Recent Labs  Lab 07/11/19 1056  INR 1.2   Cardiac Enzymes: No results for input(s): CKTOTAL, CKMB, CKMBINDEX, TROPONINI in the last 168 hours. BNP (last 3 results) No results for input(s): PROBNP in the last 8760 hours. HbA1C: No results for input(s): HGBA1C in the last 72 hours. CBG: Recent Labs  Lab 07/12/19 0808 07/13/19 0909  GLUCAP 94 92   Lipid Profile: No results for input(s): CHOL, HDL, LDLCALC, TRIG, CHOLHDL, LDLDIRECT in the last 72 hours. Thyroid Function Tests: No results for input(s): TSH, T4TOTAL, FREET4, T3FREE, THYROIDAB in the last 72 hours. Anemia Panel: No results for input(s): VITAMINB12, FOLATE, FERRITIN, TIBC, IRON, RETICCTPCT in the last 72 hours. Sepsis Labs: No results for input(s): PROCALCITON, LATICACIDVEN in the last 168 hours.  Recent Results (from the past 240 hour(s))  SARS CORONAVIRUS 2 (TAT 6-24 HRS) Nasopharyngeal Nasopharyngeal Swab     Status: None   Collection Time: 07/11/19 11:22 AM   Specimen: Nasopharyngeal Swab  Result Value Ref Range Status   SARS Coronavirus  2 NEGATIVE NEGATIVE Final    Comment: (NOTE) SARS-CoV-2 target nucleic acids are NOT DETECTED. The SARS-CoV-2 RNA is generally detectable in upper and lower respiratory specimens during the acute phase of infection. Negative results do not preclude SARS-CoV-2 infection, do not rule out co-infections with other pathogens, and should not be used as the sole basis for treatment or other patient management decisions. Negative results must be combined with clinical observations, patient history, and epidemiological information. The expected result is Negative. Fact Sheet for Patients: SugarRoll.be Fact Sheet for Healthcare Providers: https://www.woods-mathews.com/ This test is not yet approved or cleared by the [place] FDA and  has been authorized for detection and/or diagnosis of SARS-CoV-2 by FDA under an Emergency Use Authorization (EUA). This EUA will remain  in effect (meaning  this test can be used) for the duration of the COVID-19 declaration under Section 56 4(b)(1) of the Act, 21 U.S.C. section 360bbb-3(b)(1), unless the authorization is terminated or revoked sooner. Performed at [facility], [place] [address], [place], [place] [address]   CULTURE, BLOOD (ROUTINE X 2) w Reflex to ID Panel     Status: None (Preliminary result)   Collection Time: 07/11/19  1:45 PM   Specimen: BLOOD  Result Value Ref Range Status   Specimen Description BLOOD BLOOD RIGHT FOREARM  Final   Special Requests   Final    BOTTLES DRAWN AEROBIC AND ANAEROBIC Blood Culture adequate volume   Culture   Final    NO GROWTH 3 DAYS Performed at [facility], [address] [place], [place] [address]    Report Status PENDING  Incomplete  CULTURE, BLOOD (ROUTINE X 2) w Reflex to ID Panel     Status: None (Preliminary result)   Collection Time: 07/11/19  1:45 PM   Specimen: BLOOD  Result Value Ref Range Status   Specimen Description BLOOD BLOOD LEFT  FOREARM  Final   Special Requests   Final    BOTTLES DRAWN AEROBIC AND ANAEROBIC Blood Culture adequate volume   Culture   Final    NO GROWTH 3 DAYS Performed at [facility], [address], [place], [place] [address]    Report Status PENDING  Incomplete  MRSA PCR Screening     Status: Abnormal   Collection Time: 07/13/19  9:15 AM   Specimen: Nasal Mucosa; Nasopharyngeal  Result Value Ref Range Status   MRSA by PCR POSITIVE (A) NEGATIVE Final    Comment:        The GeneXpert MRSA Assay (FDA approved for NASAL specimens only), is one component of a comprehensive MRSA colonization surveillance program. It is not intended to diagnose MRSA infection nor to guide or monitor treatment for MRSA infections. RESULT CALLED TO, READ BACK BY AND VERIFIED WITH: MEGAN SHEFFIELD ON 07/13/2019 AT 1040 QSD Performed at [facility], [place] [place],  [address]          Radiology Studies: ECHOCARDIOGRAM COMPLETE  Result Date: 07/12/2019   ECHOCARDIOGRAM REPORT   Patient Name:   RIDGELY FAWCETT Date of Exam: 07/12/2019 Medical Rec #:  [id number]        Height:       65.0 in Accession #:    [id number]       Weight:       137.0 lb Date of Birth:  1966-01-30        BSA:          1.68 m Patient Age:    54 years         BP:           90/68 mmHg Patient Gender: M                HR:           87 bpm. Exam Location:  [facility] Procedure: 2D Echo Indications:     Syncope 780.2/R55  History:         Patient has prior history of Echocardiogram examinations, most                  recent 10/23/2017. Stroke; Risk Factors:Hypertension.  Sonographer:     Avanell Shackleton Referring Phys:  Unknown Foley NIU Diagnosing Phys: Skeet Latch MD IMPRESSIONS  1. Left ventricular ejection fraction, by visual estimation,  is 60 to 65%. The left ventricle has normal function. Left ventricular septal wall thickness was normal. Normal left ventricular posterior wall thickness. There is no left  ventricular hypertrophy.  2. Left ventricular diastolic parameters are consistent with Grade I diastolic dysfunction (impaired relaxation).  3. The left ventricle has no regional wall motion abnormalities.  4. Global right ventricle has normal systolic function.The right ventricular size is normal. No increase in right ventricular wall thickness.  5. Left atrial size was normal.  6. Right atrial size was normal.  7. The mitral valve is normal in structure. Trivial mitral valve regurgitation. No evidence of mitral stenosis.  8. The tricuspid valve is normal in structure. Tricuspid valve regurgitation is mild.  9. The aortic valve is normal in structure. Aortic valve regurgitation is not visualized. No evidence of aortic valve sclerosis or stenosis. 10. Pulmonic regurgitation is mild. 11. The pulmonic valve was normal in structure. Pulmonic valve regurgitation is mild. 12. Mildly elevated pulmonary artery systolic pressure. 13. The inferior vena cava is normal in size with greater than 50% respiratory variability, suggesting right atrial pressure of 3 mmHg. FINDINGS  Left Ventricle: Left ventricular ejection fraction, by visual estimation, is 60 to 65%. The left ventricle has normal function. The left ventricle has no regional wall motion abnormalities. Normal left ventricular posterior wall thickness. There is no left ventricular hypertrophy. Left ventricular diastolic parameters are consistent with Grade I diastolic dysfunction (impaired relaxation). Normal left atrial pressure. Right Ventricle: The right ventricular size is normal. No increase in right ventricular wall thickness. Global RV systolic function is has normal systolic function. The tricuspid regurgitant velocity is 2.37 m/s, and with an assumed right atrial pressure  of 10 mmHg, the estimated right ventricular systolic pressure is mildly elevated at 32.5 mmHg. Left Atrium: Left atrial size was normal in size. Right Atrium: Right atrial size was normal  in size Pericardium: There is no evidence of pericardial effusion. Mitral Valve: The mitral valve is normal in structure. Trivial mitral valve regurgitation. No evidence of mitral valve stenosis by observation. Tricuspid Valve: The tricuspid valve is normal in structure. Tricuspid valve regurgitation is mild. Aortic Valve: The aortic valve is normal in structure. Aortic valve regurgitation is not visualized. The aortic valve is structurally normal, with no evidence of sclerosis or stenosis. Pulmonic Valve: The pulmonic valve was normal in structure. Pulmonic valve regurgitation is mild. Pulmonic regurgitation is mild. Aorta: The aortic root, ascending aorta and aortic arch are all structurally normal, with no evidence of dilitation or obstruction. Venous: The inferior vena cava is normal in size with greater than 50% respiratory variability, suggesting right atrial pressure of 3 mmHg. IAS/Shunts: No atrial level shunt detected by color flow Doppler. There is no evidence of a patent foramen ovale. No ventricular septal defect is seen or detected. There is no evidence of an atrial septal defect.  LEFT VENTRICLE PLAX 2D LVIDd:         4.48 cm  Diastology LVIDs:         2.55 cm  LV e' lateral:   14.30 cm/s LV PW:         0.68 cm  LV E/e' lateral: 4.0 LV IVS:        0.72 cm  LV e' medial:    8.49 cm/s LVOT diam:     2.10 cm  LV E/e' medial:  6.8 LV SV:         68 ml LV SV Index:   40.19 LVOT Area:  3.46 cm  RIGHT VENTRICLE            IVC RV S prime:     8.38 cm/s  IVC diam: 1.46 cm TAPSE (M-mode): 1.7 cm LEFT ATRIUM             Index       RIGHT ATRIUM          Index LA diam:        4.10 cm 2.43 cm/m  RA Area:     9.26 cm LA Vol (A2C):   44.8 ml 26.60 ml/m RA Volume:   15.10 ml 8.97 ml/m LA Vol (A4C):   39.5 ml 23.45 ml/m LA Biplane Vol: 42.9 ml 25.47 ml/m   AORTA Ao Root diam: 3.70 cm MITRAL VALVE                        TRICUSPID VALVE MV Area (PHT): 2.99 cm             TR Peak grad:   22.5 mmHg MV PHT:         73.66 msec           TR Vmax:        246.00 cm/s MV Decel Time: 254 msec MV E velocity: 57.70 cm/s 103 cm/s  SHUNTS MV A velocity: 73.70 cm/s 70.3 cm/s Systemic Diam: 2.10 cm MV E/A ratio:  0.78       1.5  Skeet Latch MD Electronically signed by Skeet Latch MD Signature Date/Time: 07/12/2019/3:16:33 PM    Final         Scheduled Meds:  busPIRone  15 mg Oral BID   Chlorhexidine Gluconate Cloth  6 each Topical Daily   FLUoxetine  40 mg Oral Daily   folic acid  1 mg Oral Daily   gabapentin  600 mg Oral TID   levothyroxine  125 mcg Oral QAC breakfast   LORazepam  0-4 mg Intravenous Q12H   multivitamin with minerals  1 tablet Oral Daily   pantoprazole (PROTONIX) IV  40 mg Intravenous Q12H   phosphorus  500 mg Oral QID   potassium chloride  20 mEq Oral Once   prazosin  1 mg Oral QHS   propranolol  10 mg Oral BID   thiamine  100 mg Oral Daily   Or   thiamine  100 mg Intravenous Daily   Continuous Infusions:  sodium chloride 75 mL/hr at 07/14/19 0827   sodium chloride     ciprofloxacin Stopped (07/14/19 0022)   dexmedetomidine (PRECEDEX) IV infusion 1 mcg/kg/hr (07/14/19 0827)   metronidazole Stopped (07/14/19 0555)     LOS: 3 days    Time spent: 30 mins    Wyvonnia Dusky, MD [facility] Pager [phone]  If 7PM-7AM, please contact night-coverage [email or web address] Password [facility] 07/14/2019, 9:14 AM

## 2019-07-15 ENCOUNTER — Encounter: Payer: Self-pay | Admitting: Internal Medicine

## 2019-07-15 DIAGNOSIS — K922 Gastrointestinal hemorrhage, unspecified: Secondary | ICD-10-CM

## 2019-07-15 LAB — CBC WITH DIFFERENTIAL/PLATELET
Abs Immature Granulocytes: 0.05 10*3/uL (ref 0.00–0.07)
Basophils Absolute: 0 10*3/uL (ref 0.0–0.1)
Basophils Relative: 0 %
Eosinophils Absolute: 0 10*3/uL (ref 0.0–0.5)
Eosinophils Relative: 0 %
HCT: 28.8 % — ABNORMAL LOW (ref 39.0–52.0)
Hemoglobin: 9.7 g/dL — ABNORMAL LOW (ref 13.0–17.0)
Immature Granulocytes: 1 %
Lymphocytes Relative: 14 %
Lymphs Abs: 0.7 10*3/uL (ref 0.7–4.0)
MCH: 32.8 pg (ref 26.0–34.0)
MCHC: 33.7 g/dL (ref 30.0–36.0)
MCV: 97.3 fL (ref 80.0–100.0)
Monocytes Absolute: 0.5 10*3/uL (ref 0.1–1.0)
Monocytes Relative: 10 %
Neutro Abs: 4 10*3/uL (ref 1.7–7.7)
Neutrophils Relative %: 75 %
Platelets: 148 10*3/uL — ABNORMAL LOW (ref 150–400)
RBC: 2.96 MIL/uL — ABNORMAL LOW (ref 4.22–5.81)
RDW: 15.7 % — ABNORMAL HIGH (ref 11.5–15.5)
WBC: 5.4 10*3/uL (ref 4.0–10.5)
nRBC: 0 % (ref 0.0–0.2)

## 2019-07-15 LAB — COMPREHENSIVE METABOLIC PANEL
ALT: 34 U/L (ref 0–44)
AST: 136 U/L — ABNORMAL HIGH (ref 15–41)
Albumin: 2.4 g/dL — ABNORMAL LOW (ref 3.5–5.0)
Alkaline Phosphatase: 116 U/L (ref 38–126)
Anion gap: 10 (ref 5–15)
BUN: 7 mg/dL (ref 6–20)
CO2: 22 mmol/L (ref 22–32)
Calcium: 7.3 mg/dL — ABNORMAL LOW (ref 8.9–10.3)
Chloride: 100 mmol/L (ref 98–111)
Creatinine, Ser: 0.76 mg/dL (ref 0.61–1.24)
GFR calc Af Amer: >60 mL/min (ref >60)
GFR calc non Af Amer: >60 mL/min (ref >60)
Glucose, Bld: 196 mg/dL — ABNORMAL HIGH (ref 70–99)
Potassium: 2.9 mmol/L — ABNORMAL LOW (ref 3.5–5.1)
Sodium: 132 mmol/L — ABNORMAL LOW (ref 135–145)
Total Bilirubin: 3.9 mg/dL — ABNORMAL HIGH (ref 0.3–1.2)
Total Protein: 5.6 g/dL — ABNORMAL LOW (ref 6.5–8.1)

## 2019-07-15 LAB — MAGNESIUM
Magnesium: 1.5 mg/dL — ABNORMAL LOW (ref 1.7–2.4)
Magnesium: 2.6 mg/dL — ABNORMAL HIGH (ref 1.7–2.4)

## 2019-07-15 LAB — GLUCOSE, CAPILLARY: Glucose-Capillary: 115 mg/dL — ABNORMAL HIGH (ref 70–99)

## 2019-07-15 LAB — HEMOGLOBIN AND HEMATOCRIT, BLOOD
HCT: 29.8 % — ABNORMAL LOW (ref 39.0–52.0)
Hemoglobin: 10.7 g/dL — ABNORMAL LOW (ref 13.0–17.0)

## 2019-07-15 LAB — POTASSIUM
Potassium: 3.3 mmol/L — ABNORMAL LOW (ref 3.5–5.1)
Potassium: 3.9 mmol/L (ref 3.5–5.1)

## 2019-07-15 MED ORDER — SODIUM CHLORIDE 0.9 % IV BOLUS
1000.0000 mL | Freq: Once | INTRAVENOUS | Status: AC
  Administered 2019-07-15: 1000 mL via INTRAVENOUS

## 2019-07-15 MED ORDER — PANTOPRAZOLE SODIUM 40 MG PO TBEC
40.0000 mg | DELAYED_RELEASE_TABLET | Freq: Two times a day (BID) | ORAL | Status: DC
  Administered 2019-07-15 – 2019-07-17 (×4): 40 mg via ORAL
  Filled 2019-07-15 (×4): qty 1

## 2019-07-15 MED ORDER — MAGNESIUM SULFATE IN D5W 1-5 GM/100ML-% IV SOLN
1.0000 g | Freq: Once | INTRAVENOUS | Status: AC
  Administered 2019-07-15: 1 g via INTRAVENOUS
  Filled 2019-07-15: qty 100

## 2019-07-15 MED ORDER — LACTATED RINGERS IV BOLUS
500.0000 mL | Freq: Once | INTRAVENOUS | Status: AC
  Administered 2019-07-15: 500 mL via INTRAVENOUS

## 2019-07-15 MED ORDER — ALBUMIN HUMAN 25 % IV SOLN
50.0000 g | Freq: Once | INTRAVENOUS | Status: AC
  Administered 2019-07-15: 50 g via INTRAVENOUS
  Filled 2019-07-15: qty 50

## 2019-07-15 MED ORDER — POTASSIUM CHLORIDE CRYS ER 20 MEQ PO TBCR
40.0000 meq | EXTENDED_RELEASE_TABLET | ORAL | Status: AC
  Administered 2019-07-15 (×2): 40 meq via ORAL
  Filled 2019-07-15 (×2): qty 2

## 2019-07-15 MED ORDER — MAGNESIUM SULFATE 2 GM/50ML IV SOLN
2.0000 g | Freq: Once | INTRAVENOUS | Status: AC
  Administered 2019-07-15: 2 g via INTRAVENOUS
  Filled 2019-07-15: qty 50

## 2019-07-15 MED ORDER — POTASSIUM CHLORIDE CRYS ER 20 MEQ PO TBCR
40.0000 meq | EXTENDED_RELEASE_TABLET | Freq: Once | ORAL | Status: AC
  Administered 2019-07-15: 40 meq via ORAL
  Filled 2019-07-15: qty 2

## 2019-07-15 MED ORDER — ALUM & MAG HYDROXIDE-SIMETH 200-200-20 MG/5ML PO SUSP
15.0000 mL | Freq: Four times a day (QID) | ORAL | Status: DC | PRN
  Administered 2019-07-15: 15 mL via ORAL
  Filled 2019-07-15: qty 30

## 2019-07-15 MED ORDER — MUPIROCIN 2 % EX OINT
1.0000 "application " | TOPICAL_OINTMENT | Freq: Two times a day (BID) | CUTANEOUS | Status: DC
  Administered 2019-07-15 – 2019-07-17 (×4): 1 via NASAL
  Filled 2019-07-15 (×2): qty 22

## 2019-07-15 MED ORDER — LACTATED RINGERS IV BOLUS
250.0000 mL | Freq: Once | INTRAVENOUS | Status: AC
  Administered 2019-07-15: 250 mL via INTRAVENOUS

## 2019-07-15 MED ORDER — SODIUM CHLORIDE 0.9 % IV SOLN
0.0000 ug/min | INTRAVENOUS | Status: DC
  Administered 2019-07-15: 20 ug/min via INTRAVENOUS
  Filled 2019-07-15: qty 1

## 2019-07-15 NOTE — Progress Notes (Signed)
Progress Note    Ivan Hamilton  [id number] DOB: 1966/07/09  DOA: 07/11/2019 PCP: Tracie Harrier, MD        Assessment/Plan:   Principal Problem:   Abdominal pain Active Problems:   Hypothyroid   Gastric reflux   Hypokalemia   Depression   Syncope   Alcohol abuse   Thrombocytopenia (HCC)   Normocytic anemia   Tobacco abuse   CVA (cerebral vascular accident) ([place])   HTN (hypertension)   Diverticulitis   Transaminitis   Hepatic cirrhosis ([place])   Metabolic acidosis   Body mass index is 21.13 kg/m.    Hypotension: Improved.  Hold propranolol and prazosin.  Monitor off of IV fluids and Neo-Synephrine drip.  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  Continue Protonix and propranolol (if BP allows)  Probable acute diverticulitis: Continue empiric IV ciprofloxacin and Flagyl.  Analgesics as needed for pain.  Alcohol withdrawal syndrome: Patient is on CIWA protocol.  He has also been weaned off of IV Precedex infusion.  Continue thiamine and folic acid.  Depression: Continue antidepressants  Hypokalemia and hypomagnesemia: Replete potassium and magnesium respectively.  Monitor levels.  Patient has grade 1 diastolic dysfunction.  Thrombocytopenia: This is likely due to liver disease.  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  Not on any antiplatelets because of high risk for bleeding.   Family Communication/Anticipated D/C date and plan/Code Status   DVT prophylaxis: SCDs Code Status: Full code Family Communication: Plan discussed with the patient Disposition Plan: Possible discharge to home in 1 to 2 days      Subjective:   He complains of abdominal pain but he feels better.  No  vomiting or diarrhea.  No bloody stools.  Patient was given 2 L of normal saline and 500 mL of Ringer's lactate boluses for hypotension.  He also received IV Neo-Synephrine infusion but this has been tapered off.  Objective:    Vitals:   07/15/19 1000 07/15/19 1100 07/15/19 1200 07/15/19 1400  BP: 105/77 100/77 101/72 106/79  Pulse: (!) 108 98 96 97  Resp: 20 14 12 20   Temp:   97.8 F (36.6 C)   TempSrc:   Oral   SpO2: 100% 98% 100% 97%  Weight:      Height:        Intake/Output Summary (Last 24 hours) at 07/15/2019 1541 Last data filed at 07/15/2019 1400 Gross per 24 hour  Intake 3985.26 ml  Output 3410 ml  Net 575.26 ml   Filed Weights   07/11/19 0339 07/13/19 0900  Weight: 62.1 kg 57.6 kg    Exam:  GEN: NAD SKIN: No rash EYES: EOMI, icteric ENT: MMM CV: RRR PULM: CTA B ABD: soft, ND, mild mid abdominal tenderness without rebound tenderness or guarding, +BS CNS: AAO x 3, non focal EXT: No edema or tenderness   Data Reviewed:   I have personally reviewed following labs and imaging studies:  Labs: Labs show the following:   Basic Metabolic Panel: Recent Labs  Lab 07/11/19 1835 07/12/19 0701 07/13/19 0158 07/14/19 0708 07/15/19 0032 07/15/19 0952  NA 136 135 141 135 132*  --   K 3.4* 3.2* 3.8 3.2* 2.9* 3.3*  CL 99 100 110 106 100  --   CO2 25 24 21* 18* 22  --  GLUCOSE 134* 105* 104* 120* 196*  --   BUN <5* <5* <5* <5* 7  --   CREATININE 0.60* 0.69 0.57* 0.50* 0.76  --   CALCIUM 8.4* 8.0* 8.2* 7.6* 7.3*  --   MG 1.4*  --  1.7 1.9 1.5*  --   PHOS 1.6*  --   --  2.6  --   --    GFR Estimated Creatinine Clearance: 85 mL/min (by C-G formula based on SCr of 0.76 mg/dL). Liver Function Tests: Recent Labs  Lab 07/11/19 1835 07/12/19 0701 07/13/19 0158 07/14/19 0708 07/15/19 0032  AST 172* 138* 138* 142* 136*  ALT 45* 38 36 39 34  ALKPHOS 127* 114 107 116 116  BILITOT 4.1* 4.2* 4.9* 4.5* 3.9*  PROT 7.1 6.5 6.7 6.9 5.6*  ALBUMIN 3.2* 2.8*  2.8* 2.8* 2.4*   Recent Labs  Lab 07/11/19 0352  LIPASE 35   Recent Labs  Lab 07/11/19 1835  AMMONIA 51*   Coagulation profile Recent Labs  Lab 07/11/19 1056  INR 1.2    CBC: Recent Labs  Lab 07/11/19 0352 07/11/19 1835 07/13/19 0158 07/14/19 0708 07/15/19 0032 07/15/19 0952  WBC 5.3 5.2 3.8* 4.3 5.4  --   NEUTROABS  --   --   --   --  4.0  --   HGB 9.7* 10.0* 9.7* 11.2* 9.7* 10.7*  HCT 26.6* 28.9* 29.3* 33.4* 28.8* 29.8*  MCV 90.8 94.4 100.0 99.1 97.3  --   PLT 70* 82* 89* 135* 148*  --    Cardiac Enzymes: No results for input(s): CKTOTAL, CKMB, CKMBINDEX, TROPONINI in the last 168 hours. BNP (last 3 results) No results for input(s): PROBNP in the last 8760 hours. CBG: Recent Labs  Lab 07/12/19 0808 07/13/19 0909 07/15/19 0744  GLUCAP 94 92 115*   D-Dimer: No results for input(s): DDIMER in the last 72 hours. Hgb A1c: No results for input(s): HGBA1C in the last 72 hours. Lipid Profile: No results for input(s): CHOL, HDL, LDLCALC, TRIG, CHOLHDL, LDLDIRECT in the last 72 hours. Thyroid function studies: No results for input(s): TSH, T4TOTAL, T3FREE, THYROIDAB in the last 72 hours.  Invalid input(s): FREET3 Anemia work up: No results for input(s): VITAMINB12, FOLATE, FERRITIN, TIBC, IRON, RETICCTPCT in the last 72 hours. Sepsis Labs: Recent Labs  Lab 07/11/19 1835 07/13/19 0158 07/14/19 0708 07/15/19 0032  WBC 5.2 3.8* 4.3 5.4    Microbiology Recent Results (from the past 240 hour(s))  SARS CORONAVIRUS 2 (TAT 6-24 HRS) Nasopharyngeal Nasopharyngeal Swab     Status: None   Collection Time: 07/11/19 11:22 AM   Specimen: Nasopharyngeal Swab  Result Value Ref Range Status   SARS Coronavirus 2 NEGATIVE NEGATIVE Final    Comment: (NOTE) SARS-CoV-2 target nucleic acids are NOT DETECTED. The SARS-CoV-2 RNA is generally detectable in upper and lower respiratory specimens during the acute phase of infection. Negative results do not preclude  SARS-CoV-2 infection, do not rule out co-infections with other pathogens, and should not be used as the sole basis for treatment or other patient management decisions. Negative results must be combined with clinical observations, patient history, and epidemiological information. The expected result is Negative. Fact Sheet for Patients: SugarRoll.be Fact Sheet for Healthcare Providers: https://www.woods-mathews.com/ This test is not yet approved or cleared by the [place] FDA and  has been authorized for detection and/or diagnosis of SARS-CoV-2 by FDA under an Emergency Use Authorization (EUA). This EUA will remain  in effect (meaning this test can be used) for  the duration of the COVID-19 declaration under Section 56 4(b)(1) of the Act, 21 U.S.C. section 360bbb-3(b)(1), unless the authorization is terminated or revoked sooner. Performed at [facility], [place] [address], [place], [place] [address]   CULTURE, BLOOD (ROUTINE X 2) w Reflex to ID Panel     Status: None (Preliminary result)   Collection Time: 07/11/19  1:45 PM   Specimen: BLOOD  Result Value Ref Range Status   Specimen Description BLOOD BLOOD RIGHT FOREARM  Final   Special Requests   Final    BOTTLES DRAWN AEROBIC AND ANAEROBIC Blood Culture adequate volume   Culture   Final    NO GROWTH 4 DAYS Performed at [facility], [address] [place], [place] [address]    Report Status PENDING  Incomplete  CULTURE, BLOOD (ROUTINE X 2) w Reflex to ID Panel     Status: None (Preliminary result)   Collection Time: 07/11/19  1:45 PM   Specimen: BLOOD  Result Value Ref Range Status   Specimen Description BLOOD BLOOD LEFT FOREARM  Final   Special Requests   Final    BOTTLES DRAWN AEROBIC AND ANAEROBIC Blood Culture adequate volume   Culture   Final    NO GROWTH 4 DAYS Performed at [facility], [address], [place], [place] [address]    Report Status  PENDING  Incomplete  MRSA PCR Screening     Status: Abnormal   Collection Time: 07/13/19  9:15 AM   Specimen: Nasal Mucosa; Nasopharyngeal  Result Value Ref Range Status   MRSA by PCR POSITIVE (A) NEGATIVE Final    Comment:        The GeneXpert MRSA Assay (FDA approved for NASAL specimens only), is one component of a comprehensive MRSA colonization surveillance program. It is not intended to diagnose MRSA infection nor to guide or monitor treatment for MRSA infections. RESULT CALLED TO, READ BACK BY AND VERIFIED WITH: MEGAN SHEFFIELD ON 07/13/2019 AT 1040 QSD Performed at [facility], [address] [place], [place] [address]     Procedures and diagnostic studies:  No results found.  Medications:   . busPIRone  15 mg Oral BID  . Chlorhexidine Gluconate Cloth  6 each Topical Daily  . FLUoxetine  40 mg Oral Daily  . folic acid  1 mg Oral Daily  . gabapentin  600 mg Oral TID  . levothyroxine  125 mcg Oral QAC breakfast  . LORazepam  0-4 mg Intravenous Q12H  . multivitamin with minerals  1 tablet Oral Daily  . pantoprazole  40 mg Oral BID  . thiamine  100 mg Oral Daily   Or  . thiamine  100 mg Intravenous Daily   Continuous Infusions: . sodium chloride    . ciprofloxacin Stopped (07/15/19 1055)  . dexmedetomidine (PRECEDEX) IV infusion Stopped (07/15/19 0742)  . metronidazole 500 mg (07/15/19 1213)  . phenylephrine (NEO-SYNEPHRINE) Adult infusion Stopped (07/15/19 0742)     LOS: 4 days   Marckus Hanover  [facility]   *Please refer to [email or web address], password [email or web address] to get updated schedule on who will round on this patient, as hospitalists switch teams weekly. If 7PM-7AM, please contact night-coverage at [email or web address], password [email or web address] for any overnight needs.  07/15/2019, 3:41 PM

## 2019-07-15 NOTE — Progress Notes (Signed)
Pt expressed interest in getting up to chair and removing condom cath and using urinal during his bath.  Writing RN found a recliner for him and brought a urinal, but when his bath was complete he declined to get up or use the urinal at this time.  New condom cath placed and chair placed in corner for him.  He was concerned when his sitter was released because he sates :she helps me a lot," but I reassured him I would hear him if he called and showed him how to use his call button.

## 2019-07-15 NOTE — Progress Notes (Signed)
Vs stable Precedex off-DT's resolved GIB resolved No ICU needs at this time  Will sign off at this time Please call with any questions

## 2019-07-16 DIAGNOSIS — K7030 Alcoholic cirrhosis of liver without ascites: Secondary | ICD-10-CM

## 2019-07-16 LAB — COMPREHENSIVE METABOLIC PANEL
ALT: 38 U/L (ref 0–44)
AST: 135 U/L — ABNORMAL HIGH (ref 15–41)
Albumin: 3 g/dL — ABNORMAL LOW (ref 3.5–5.0)
Alkaline Phosphatase: 131 U/L — ABNORMAL HIGH (ref 38–126)
Anion gap: 11 (ref 5–15)
BUN: <5 mg/dL — ABNORMAL LOW (ref 6–20)
CO2: 21 mmol/L — ABNORMAL LOW (ref 22–32)
Calcium: 8 mg/dL — ABNORMAL LOW (ref 8.9–10.3)
Chloride: 109 mmol/L (ref 98–111)
Creatinine, Ser: 0.64 mg/dL (ref 0.61–1.24)
GFR calc Af Amer: >60 mL/min (ref >60)
GFR calc non Af Amer: >60 mL/min (ref >60)
Glucose, Bld: 113 mg/dL — ABNORMAL HIGH (ref 70–99)
Potassium: 4 mmol/L (ref 3.5–5.1)
Sodium: 141 mmol/L (ref 135–145)
Total Bilirubin: 3.9 mg/dL — ABNORMAL HIGH (ref 0.3–1.2)
Total Protein: 7 g/dL (ref 6.5–8.1)

## 2019-07-16 LAB — CORTISOL: Cortisol, Plasma: 20.8 ug/dL

## 2019-07-16 LAB — CBC
HCT: 30.7 % — ABNORMAL LOW (ref 39.0–52.0)
Hemoglobin: 10.8 g/dL — ABNORMAL LOW (ref 13.0–17.0)
MCH: 33.1 pg (ref 26.0–34.0)
MCHC: 35.2 g/dL (ref 30.0–36.0)
MCV: 94.2 fL (ref 80.0–100.0)
Platelets: 226 10*3/uL (ref 150–400)
RBC: 3.26 MIL/uL — ABNORMAL LOW (ref 4.22–5.81)
RDW: 17.7 % — ABNORMAL HIGH (ref 11.5–15.5)
WBC: 7.6 10*3/uL (ref 4.0–10.5)
nRBC: 0 % (ref 0.0–0.2)

## 2019-07-16 LAB — GLUCOSE, CAPILLARY: Glucose-Capillary: 92 mg/dL (ref 70–99)

## 2019-07-16 LAB — CULTURE, BLOOD (ROUTINE X 2)
Culture: NO GROWTH
Culture: NO GROWTH
Special Requests: ADEQUATE
Special Requests: ADEQUATE

## 2019-07-16 MED ORDER — PROPRANOLOL HCL 10 MG PO TABS
10.0000 mg | ORAL_TABLET | Freq: Two times a day (BID) | ORAL | Status: DC
  Administered 2019-07-16: 10 mg via ORAL
  Filled 2019-07-16 (×2): qty 1

## 2019-07-16 NOTE — Consult Note (Addendum)
Pharmacy Antibiotic Note  Ivan Hamilton is a 53 y.o. male admitted on 07/11/2019 with Intra-abdominal infection .  Pharmacy has been consulted for Ciprofloxacin dosing. Patient recently transitioned from the ICU.   Day #6: Ciprofloxacin IV + metronidazole IV  Plan: 1. Ciprofloxacin IV 400 mg Q12H. D/w Dr. Mal Misty, 7 total days of ciprofloxacin and metronidazole. Last day of antibiotics are tomorrow and pharmacy can sign-off on consult at that time.   Height: 5\' 5"  (165.1 cm) Weight: 126 lb 15.8 oz (57.6 kg) IBW/kg (Calculated) : 61.5  Temp (24hrs), Avg:98.1 F (36.7 C), Min:97.7 F (36.5 C), Max:98.7 F (37.1 C)  Recent Labs  Lab 07/11/19 1835 07/12/19 0701 07/13/19 0158 07/14/19 0708 07/15/19 0032 07/16/19 0258  WBC 5.2  --  3.8* 4.3 5.4 7.6  CREATININE 0.60* 0.69 0.57* 0.50* 0.76 0.64    Estimated Creatinine Clearance: 85 mL/min (by C-G formula based on SCr of 0.64 mg/dL).    No Known Allergies   Thank you for allowing pharmacy to be a part of this patient's care.  Rowland Lathe 07/16/2019 11:23 AM

## 2019-07-16 NOTE — Progress Notes (Signed)
Ivan Lame, MD [facility]   [address], [place], [place] [address] Phone: [phone] Fax : [phone]   Subjective: The patient is alert and awake today for the first time since I have taken over his care.  The patient reports that he has epigastric discomfort but has had no further sign of any GI bleeding.  The patient was discharged from the ICU and put on a regular floor and does not appear to have any signs of withdrawal at the present time.   Objective: Vital signs in last 24 hours: Vitals:   07/15/19 2100 07/15/19 2200 07/16/19 0016 07/16/19 0751  BP: 118/80 113/85 (!) 127/99 (!) 138/95  Pulse: 84 83 93 75  Resp: 11 11 18 18   Temp:   98.7 F (37.1 C) 98.1 F (36.7 C)  TempSrc:   Oral Oral  SpO2:  97% 99% 99%  Weight:      Height:       Weight change:   Intake/Output Summary (Last 24 hours) at 07/16/2019 1128 Last data filed at 07/15/2019 2300 Gross per 24 hour  Intake 559.06 ml  Output 1220 ml  Net -660.94 ml     Exam: Heart:: Regular rate and rhythm, S1S2 present or without murmur or extra heart sounds Lungs: normal and clear to auscultation and percussion Abdomen: Epigastric tenderness without rebound without guarding positive bowel sounds.   Lab Results: @LABTEST2 @ Micro Results: Recent Results (from the past 240 hour(s))  SARS CORONAVIRUS 2 (TAT 6-24 HRS) Nasopharyngeal Nasopharyngeal Swab     Status: None   Collection Time: 07/11/19 11:22 AM   Specimen: Nasopharyngeal Swab  Result Value Ref Range Status   SARS Coronavirus 2 NEGATIVE NEGATIVE Final    Comment: (NOTE) SARS-CoV-2 target nucleic acids are NOT DETECTED. The SARS-CoV-2 RNA is generally detectable in upper and lower respiratory specimens during the acute phase of infection. Negative results do not preclude SARS-CoV-2 infection, do not rule out co-infections with other pathogens, and should not be used as the sole basis for treatment or other patient management decisions.  Negative results must be combined with clinical observations, patient history, and epidemiological information. The expected result is Negative. Fact Sheet for Patients: SugarRoll.be Fact Sheet for Healthcare Providers: https://www.woods-mathews.com/ This test is not yet approved or cleared by the [place] FDA and  has been authorized for detection and/or diagnosis of SARS-CoV-2 by FDA under an Emergency Use Authorization (EUA). This EUA will remain  in effect (meaning this test can be used) for the duration of the COVID-19 declaration under Section 56 4(b)(1) of the Act, 21 U.S.C. section 360bbb-3(b)(1), unless the authorization is terminated or revoked sooner. Performed at [facility], [place] [address], [place], [place] [address]   CULTURE, BLOOD (ROUTINE X 2) w Reflex to ID Panel     Status: None   Collection Time: 07/11/19  1:45 PM   Specimen: BLOOD  Result Value Ref Range Status   Specimen Description BLOOD BLOOD RIGHT FOREARM  Final   Special Requests   Final    BOTTLES DRAWN AEROBIC AND ANAEROBIC Blood Culture adequate volume   Culture   Final    NO GROWTH 5 DAYS Performed at [facility], [address] [place],  [address]    Report Status 07/16/2019 FINAL  Final  CULTURE, BLOOD (ROUTINE X 2) w Reflex to ID Panel     Status: None   Collection Time: 07/11/19  1:45 PM   Specimen: BLOOD  Result Value Ref Range  Status   Specimen Description BLOOD BLOOD LEFT FOREARM  Final   Special Requests   Final    BOTTLES DRAWN AEROBIC AND ANAEROBIC Blood Culture adequate volume   Culture   Final    NO GROWTH 5 DAYS Performed at [facility], [address] [place], [place] [address]    Report Status 07/16/2019 FINAL  Final  MRSA PCR Screening     Status: Abnormal   Collection Time: 07/13/19  9:15 AM   Specimen: Nasal Mucosa; Nasopharyngeal  Result Value Ref Range Status   MRSA by PCR POSITIVE (A)  NEGATIVE Final    Comment:        The GeneXpert MRSA Assay (FDA approved for NASAL specimens only), is one component of a comprehensive MRSA colonization surveillance program. It is not intended to diagnose MRSA infection nor to guide or monitor treatment for MRSA infections. RESULT CALLED TO, READ BACK BY AND VERIFIED WITH: MEGAN SHEFFIELD ON 07/13/2019 AT 1040 QSD Performed at [facility], [address], [place], [place] [address]    Studies/Results: No results found. Medications: I have reviewed the patient's current medications. Scheduled Meds: . busPIRone  15 mg Oral BID  . Chlorhexidine Gluconate Cloth  6 each Topical Daily  . FLUoxetine  40 mg Oral Daily  . folic acid  1 mg Oral Daily  . gabapentin  600 mg Oral TID  . levothyroxine  125 mcg Oral QAC breakfast  . multivitamin with minerals  1 tablet Oral Daily  . mupirocin ointment  1 application Nasal BID  . pantoprazole  40 mg Oral BID  . propranolol  10 mg Oral BID  . thiamine  100 mg Oral Daily   Or  . thiamine  100 mg Intravenous Daily   Continuous Infusions: . sodium chloride    . ciprofloxacin 400 mg (07/16/19 1030)  . dexmedetomidine (PRECEDEX) IV infusion Stopped (07/15/19 0742)  . metronidazole 500 mg (07/16/19 [id number])  . phenylephrine (NEO-SYNEPHRINE) Adult infusion Stopped (07/15/19 0742)   PRN Meds:.acetaminophen, alum & mag hydroxide-simeth, hydrOXYzine, morphine injection, ondansetron (ZOFRAN) IV, traZODone   Assessment: Principal Problem:   Abdominal pain Active Problems:   Hypothyroid   Gastric reflux   Hypokalemia   Depression   Syncope   Alcohol abuse   Thrombocytopenia (HCC)   Normocytic anemia   Tobacco abuse   CVA (cerebral vascular accident) ([place])   HTN (hypertension)   Diverticulitis   Transaminitis   Hepatic cirrhosis ([place])   Metabolic acidosis    Plan: This patient has had no further sign of bleeding.  The patient has been strongly recommended to stop all  alcohol intake and has been told that continued alcohol abuse will likely bring him back to the hospital and may be fatal.  The patient states that he will avoid any further alcohol use.  Nothing further to do from a GI point of view with his stable hemoglobin.  The patient should follow-up with Dr. Vicente Males at the beginning of the year. I will sign off.  Please call if any further GI concerns or questions.  We would like to thank you for the opportunity to participate in the care of Ivan Hamilton.     LOS: 5 days   Ivan Hamilton 07/16/2019, 11:28 AM Pager [phone] 7am-5pm  Check AMION for 5pm -7am coverage and on weekends

## 2019-07-16 NOTE — Progress Notes (Addendum)
Progress Note    Ivan Hamilton  [id number] DOB: Sep 30, 1965  DOA: 07/11/2019 PCP: Tracie Harrier, MD        Assessment/Plan:   Principal Problem:   Abdominal pain Active Problems:   Hypothyroid   Gastric reflux   Hypokalemia   Depression   Syncope   Alcohol abuse   Thrombocytopenia (HCC)   Normocytic anemia   Tobacco abuse   CVA (cerebral vascular accident) ([place])   HTN (hypertension)   Diverticulitis   Transaminitis   Hepatic cirrhosis ([place])   Metabolic acidosis   Body mass index is 21.13 kg/m.    Hypotension: BP dropped to 83/73 today.  Propranolol was restarted this morning but it will be discontinued again. It appears patient will not be able to tolerate propranolol at this time.  Prazosin is also on hold.   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  Continue Protonix.  Discontinued propranolol because of hypotension  Probable acute diverticulitis: Continue empiric IV ciprofloxacin and Flagyl.  Analgesics as needed for pain.  Alcohol withdrawal syndrome: Resolved.  Patient is on CIWA protocol.  He has also been weaned off of IV Precedex infusion.  Continue thiamine and folic acid.  Depression: Continue antidepressants  Hypokalemia and hypomagnesemia: Improved.    Patient has grade 1 diastolic dysfunction.  Thrombocytopenia: Improved.    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  Not on any antiplatelets because of high risk for bleeding.  Generalized weakness and dizziness: Consulted PT.  Patient did well with PT and no skilled PT is required in the hospital.  Dizziness is likely from hypotension.  Monitor patient formally in the hospital and likely discharge home  tomorrow.   Family Communication/Anticipated D/C date and plan/Code Status   DVT prophylaxis: SCDs Code Status: Full code Family Communication: Plan discussed with the patient Disposition Plan: Possible discharge to home tomorrow      Subjective:   He complains of generalized weakness and dizziness especially on standing.  He still has some epigastric pain.  Objective:    Vitals:   07/16/19 1225 07/16/19 1228 07/16/19 1230 07/16/19 1232  BP: 95/74 107/77 (!) 83/73 97/71  Pulse: 76 80 95 75  Resp: 20 18 18    Temp: 98 F (36.7 C)     TempSrc: Oral     SpO2:  100% 100%   Weight:      Height:        Intake/Output Summary (Last 24 hours) at 07/16/2019 1442 Last data filed at 07/16/2019 1300 Gross per 24 hour  Intake 477 ml  Output 960 ml  Net -483 ml   Filed Weights   07/11/19 0339 07/13/19 0900  Weight: 62.1 kg 57.6 kg    Exam:   GEN: NAD SKIN: No rash EYES: EOMI ENT: MMM CV: RRR PULM: CTA B ABD: soft, ND, mild epigastric tenderness without rebound tenderness or guarding, +BS CNS: AAO x 3, non focal EXT: No edema or tenderness    Data Reviewed:   I have personally reviewed following labs and imaging studies:  Labs: Labs show the following:   Basic Metabolic Panel: Recent Labs  Lab 07/11/19 1835 07/12/19 0701 07/13/19 0158 07/14/19 0708 07/15/19 0032 07/15/19 1720 07/16/19 0258  NA 136 135 141 135 132*  --  141  K 3.4* 3.2*  3.8 3.2* 2.9* 3.9 4.0  CL 99 100 110 106 100  --  109  CO2 25 24 21* 18* 22  --  21*  GLUCOSE 134* 105* 104* 120* 196*  --  113*  BUN <5* <5* <5* <5* 7  --  <5*  CREATININE 0.60* 0.69 0.57* 0.50* 0.76  --  0.64  CALCIUM 8.4* 8.0* 8.2* 7.6* 7.3*  --  8.0*  MG 1.4*  --  1.7 1.9 1.5* 2.6*  --   PHOS 1.6*  --   --  2.6  --   --   --    GFR Estimated Creatinine Clearance: 85 mL/min (by C-G formula based on SCr of 0.64 mg/dL). Liver Function Tests: Recent Labs  Lab 07/12/19 0701 07/13/19 0158 07/14/19 0708  07/15/19 0032 07/16/19 0258  AST 138* 138* 142* 136* 135*  ALT 38 36 39 34 38  ALKPHOS 114 107 116 116 131*  BILITOT 4.2* 4.9* 4.5* 3.9* 3.9*  PROT 6.5 6.7 6.9 5.6* 7.0  ALBUMIN 2.8* 2.8* 2.8* 2.4* 3.0*   Recent Labs  Lab 07/11/19 0352  LIPASE 35   Recent Labs  Lab 07/11/19 1835  AMMONIA 51*   Coagulation profile Recent Labs  Lab 07/11/19 1056  INR 1.2    CBC: Recent Labs  Lab 07/11/19 1835 07/13/19 0158 07/14/19 0708 07/15/19 0032 07/15/19 0952 07/16/19 0258  WBC 5.2 3.8* 4.3 5.4  --  7.6  NEUTROABS  --   --   --  4.0  --   --   HGB 10.0* 9.7* 11.2* 9.7* 10.7* 10.8*  HCT 28.9* 29.3* 33.4* 28.8* 29.8* 30.7*  MCV 94.4 100.0 99.1 97.3  --  94.2  PLT 82* 89* 135* 148*  --  226   Cardiac Enzymes: No results for input(s): CKTOTAL, CKMB, CKMBINDEX, TROPONINI in the last 168 hours. BNP (last 3 results) No results for input(s): PROBNP in the last 8760 hours. CBG: Recent Labs  Lab 07/12/19 0808 07/13/19 0909 07/15/19 0744 07/16/19 0751  GLUCAP 94 92 115* 92   D-Dimer: No results for input(s): DDIMER in the last 72 hours. Hgb A1c: No results for input(s): HGBA1C in the last 72 hours. Lipid Profile: No results for input(s): CHOL, HDL, LDLCALC, TRIG, CHOLHDL, LDLDIRECT in the last 72 hours. Thyroid function studies: No results for input(s): TSH, T4TOTAL, T3FREE, THYROIDAB in the last 72 hours.  Invalid input(s): FREET3 Anemia work up: No results for input(s): VITAMINB12, FOLATE, FERRITIN, TIBC, IRON, RETICCTPCT in the last 72 hours. Sepsis Labs: Recent Labs  Lab 07/13/19 0158 07/14/19 0708 07/15/19 0032 07/16/19 0258  WBC 3.8* 4.3 5.4 7.6    Microbiology Recent Results (from the past 240 hour(s))  SARS CORONAVIRUS 2 (TAT 6-24 HRS) Nasopharyngeal Nasopharyngeal Swab     Status: None   Collection Time: 07/11/19 11:22 AM   Specimen: Nasopharyngeal Swab  Result Value Ref Range Status   SARS Coronavirus 2 NEGATIVE NEGATIVE Final    Comment:  (NOTE) SARS-CoV-2 target nucleic acids are NOT DETECTED. The SARS-CoV-2 RNA is generally detectable in upper and lower respiratory specimens during the acute phase of infection. Negative results do not preclude SARS-CoV-2 infection, do not rule out co-infections with other pathogens, and should not be used as the sole basis for treatment or other patient management decisions. Negative results must be combined with clinical observations, patient history, and epidemiological information. The expected result is Negative. Fact Sheet for Patients: SugarRoll.be Fact Sheet for Healthcare Providers: https://www.woods-mathews.com/ This test is not yet approved or  cleared by the [place] and  has been authorized for detection and/or diagnosis of SARS-CoV-2 by FDA under an Emergency Use Authorization (EUA). This EUA will remain  in effect (meaning this test can be used) for the duration of the COVID-19 declaration under Section 56 4(b)(1) of the Act, 21 U.S.C. section 360bbb-3(b)(1), unless the authorization is terminated or revoked sooner. Performed at [facility], [place] [address], [place], [place] [address]   CULTURE, BLOOD (ROUTINE X 2) w Reflex to ID Panel     Status: None   Collection Time: 07/11/19  1:45 PM   Specimen: BLOOD  Result Value Ref Range Status   Specimen Description BLOOD BLOOD RIGHT FOREARM  Final   Special Requests   Final    BOTTLES DRAWN AEROBIC AND ANAEROBIC Blood Culture adequate volume   Culture   Final    NO GROWTH 5 DAYS Performed at [facility], [address] [place], [place] [address]    Report Status 07/16/2019 FINAL  Final  CULTURE, BLOOD (ROUTINE X 2) w Reflex to ID Panel     Status: None   Collection Time: 07/11/19  1:45 PM   Specimen: BLOOD  Result Value Ref Range Status   Specimen Description BLOOD BLOOD LEFT FOREARM  Final   Special Requests   Final    BOTTLES DRAWN AEROBIC AND  ANAEROBIC Blood Culture adequate volume   Culture   Final    NO GROWTH 5 DAYS Performed at [facility], [address] [place], [place] [address]    Report Status 07/16/2019 FINAL  Final  MRSA PCR Screening     Status: Abnormal   Collection Time: 07/13/19  9:15 AM   Specimen: Nasal Mucosa; Nasopharyngeal  Result Value Ref Range Status   MRSA by PCR POSITIVE (A) NEGATIVE Final    Comment:        The GeneXpert MRSA Assay (FDA approved for NASAL specimens only), is one component of a comprehensive MRSA colonization surveillance program. It is not intended to diagnose MRSA infection nor to guide or monitor treatment for MRSA infections. RESULT CALLED TO, READ BACK BY AND VERIFIED WITH: MEGAN SHEFFIELD ON 07/13/2019 AT 1040 QSD Performed at [facility], [address] [place], [place] [address]     Procedures and diagnostic studies:  No results found.  Medications:   . busPIRone  15 mg Oral BID  . Chlorhexidine Gluconate Cloth  6 each Topical Daily  . FLUoxetine  40 mg Oral Daily  . folic acid  1 mg Oral Daily  . gabapentin  600 mg Oral TID  . levothyroxine  125 mcg Oral QAC breakfast  . multivitamin with minerals  1 tablet Oral Daily  . mupirocin ointment  1 application Nasal BID  . pantoprazole  40 mg Oral BID  . thiamine  100 mg Oral Daily   Or  . thiamine  100 mg Intravenous Daily   Continuous Infusions: . sodium chloride    . ciprofloxacin 400 mg (07/16/19 1030)  . metronidazole 500 mg (07/16/19 0605)     LOS: 5 days   Charie Pinkus  [facility]   *Please refer to [email or web address], password [email or web address] to get updated schedule on who will round on this patient, as hospitalists switch teams weekly. If 7PM-7AM, please contact night-coverage at [email or web address], password [email or web address] for any overnight needs.  07/16/2019, 2:42 PM

## 2019-07-16 NOTE — Care Management Important Message (Signed)
Important Message  Patient Details  Name: Ivan Hamilton MRN: [id number] Date of Birth: 1965/11/22   Medicare Important Message Given:  Yes     Juliann Pulse A Bentley Fissel 07/16/2019, 11:06 AM

## 2019-07-16 NOTE — Evaluation (Signed)
Physical Therapy Evaluation Patient Details Name: Jaidan Prevette MRN: [id number] DOB: July 26, 1965 Today's Date: 07/16/2019   History of Present Illness  Shrihaan Porzio is a 53 y.o. male with medical history significant of stroke, hypothyroidism, depression, alcoholism, PTSD, hypertension, who presented to hospital ED 07/11/2019 with abdominal pain, syncope. He was admitted to the hospital for abdominal pain and put on CIWA protocol. GI consulted, s/p EGD 12/20 +GI bleed with esoph varicies, alcoholic liver disease with anemia and thrombocytopenia, livertest consistent with alcoholic hepatitis. Admitted to ICU for severe ETOH withdrawal-requiring precedex infusion. Returned to floor by 07/16/2019 with improving cognition.    Clinical Impression  Patient alert and oriented and able to provide detailed history. Speaks Spanish as first language but declined interpreter and spoke/understood English well enough to complete PT evaluation without interpreter. Patient lives with his wife in a single store home with no steps to enter and a tub/shower combo for bathing. Prior to hospitalization he was I with all mobility and all aspects of care. Upon physical therapy evaluation patient was functionally independent with bed mobility, transfers, and mobility. He demonstrated mild balance deficits with higher level ambulation activities such as backward ambulation, but does not appear to require further skilled physical therapy in the acute care setting. He reported movement in his visual field during movement and ambulation that may benefit from being addressed through vestibular rehab in an outpatient physical therapy setting if it persists. Patient is recommended to ambulate and perform basic mobility with nursing or independently 3x/day during remaining hospital stay to prevent any decline in function. He is now discharged from skilled physical therapy in the acute care setting due to being functionally  independent.     Follow Up Recommendations Outpatient PT(possibly outpatient care for dizziness/visual feild changes if it persists)    Equipment Recommendations  None recommended by PT    Recommendations for Other Services       Precautions / Restrictions Precautions Precautions: None Restrictions Weight Bearing Restrictions: No      Mobility  Bed Mobility Overal bed mobility: Independent             General bed mobility comments: supine to sit from flat bed  Transfers Overall transfer level: Independent               General transfer comment: transferred sit <> stand I with no UE support from bed and chair. 5 Times Sit To Stand test = 7 seconds with no UE support, tapping buttocks some reps.  Ambulation/Gait Ambulation/Gait assistance: Supervision;Min guard Gait Distance (Feet): 400 Feet(over 400 feet) Assistive device: None Gait Pattern/deviations: WFL(Within Functional Limits);Wide base of support Gait velocity: WFL   General Gait Details: Patient occasionally staggered slightly with head movement. Reports he perceives the room is moving.  Stairs            Wheelchair Mobility    Modified Rankin (Stroke Patients Only)       Balance Overall balance assessment: Independent;History of Falls   Sitting balance-Leahy Scale: Normal Sitting balance - Comments: stead sitting at edge of bed   Standing balance support: During functional activity;No upper extremity supported Standing balance-Leahy Scale: Good Standing balance comment: Patient able to ambulate             High level balance activites: Backward walking;Direction changes;Turns;Sudden stops;Head turns High Level Balance Comments: Patient with mild staggering at times during head turns. Backwards walking: requires CGA for safety when walking backwards, short step length. Appears WFL for basic mobilty.  Pertinent Vitals/Pain Pain Assessment: 0-10 Pain Score: 8   Pain Location: right abdominal upper quadrant Pain Descriptors / Indicators: Constant Pain Intervention(s): Limited activity within patient's tolerance;Monitored during session;Other (comment)(offered to request pain medication from nursing; pt stated he already had requested it and was unable to have more)    Home Living Family/patient expects to be discharged to:: Private residence Living Arrangements: Spouse/significant other Available Help at Discharge: Family Type of Home: House Home Access: Level entry     Home Layout: One [place]: None Additional Comments: states wife has equipment, but he does not    Prior Function Level of Independence: Independent         Comments: Reports he is I with all aspects of care and mobility.     Hand Dominance   Dominant Hand: Right    Extremity/Trunk Assessment   Upper Extremity Assessment Upper Extremity Assessment: Overall WFL for tasks assessed    Lower Extremity Assessment Lower Extremity Assessment: Overall WFL for tasks assessed    Cervical / Trunk Assessment Cervical / Trunk Assessment: Normal  Communication   Communication: Prefers language other than [place];No difficulties(Patient is Spanish speaking. Declined interpreter and was able to participate in eval effectively without interpreter)  Cognition Arousal/Alertness: Awake/alert Behavior During Therapy: WFL for tasks assessed/performed Overall Cognitive Status: Within Functional Limits for tasks assessed                                 General Comments: Patient required some additional cuing at times but was A&O x 4      General Comments General comments (skin integrity, edema, etc.): pt reports movement in visual fe\ield during ambulation but can complete higher level activities with acceptable balance.    Exercises     Assessment/Plan    PT Assessment All further PT needs can be met in the next venue of care  PT Problem List  Decreased balance;Pain       PT Treatment Interventions      PT Goals (Current goals can be found in the Care Plan section)  Acute Rehab PT Goals Patient Stated Goal: return home PT Goal Formulation: With patient Time For Goal Achievement: 07/30/19 Potential to Achieve Goals: Good    Frequency     Barriers to discharge        Co-evaluation               AM-PAC PT "6 Clicks" Mobility  Outcome Measure Help needed turning from your back to your side while in a flat bed without using bedrails?: None Help needed moving from lying on your back to sitting on the side of a flat bed without using bedrails?: None Help needed moving to and from a bed to a chair (including a wheelchair)?: None Help needed standing up from a chair using your arms (e.g., wheelchair or bedside chair)?: None Help needed to walk in hospital room?: None Help needed climbing 3-5 steps with a railing? : None 6 Click Score: 24    End of Session Equipment Utilized During Treatment: Gait belt Activity Tolerance: Patient tolerated treatment well;No increased pain Patient left: in chair;with call bell/phone within reach Nurse Communication: Mobility status;Precautions PT Visit Diagnosis: Unsteadiness on feet (R26.81);History of falling (Z91.81)    Time: 5615-3794 PT Time Calculation (min) (ACUTE ONLY): 21 min   Charges:   PT Evaluation $PT Eval Low Complexity: 1 Low  Everlean Alstrom. Graylon Good, PT, DPT 07/16/19, 1:40 PM

## 2019-07-16 NOTE — Plan of Care (Signed)

## 2019-07-17 LAB — COMPREHENSIVE METABOLIC PANEL
ALT: 33 U/L (ref 0–44)
AST: 93 U/L — ABNORMAL HIGH (ref 15–41)
Albumin: 2.7 g/dL — ABNORMAL LOW (ref 3.5–5.0)
Alkaline Phosphatase: 126 U/L (ref 38–126)
Anion gap: 9 (ref 5–15)
BUN: <5 mg/dL — ABNORMAL LOW (ref 6–20)
CO2: 21 mmol/L — ABNORMAL LOW (ref 22–32)
Calcium: 8.2 mg/dL — ABNORMAL LOW (ref 8.9–10.3)
Chloride: 110 mmol/L (ref 98–111)
Creatinine, Ser: 0.67 mg/dL (ref 0.61–1.24)
GFR calc Af Amer: >60 mL/min (ref >60)
GFR calc non Af Amer: >60 mL/min (ref >60)
Glucose, Bld: 109 mg/dL — ABNORMAL HIGH (ref 70–99)
Potassium: 3.7 mmol/L (ref 3.5–5.1)
Sodium: 140 mmol/L (ref 135–145)
Total Bilirubin: 3.1 mg/dL — ABNORMAL HIGH (ref 0.3–1.2)
Total Protein: 6.6 g/dL (ref 6.5–8.1)

## 2019-07-17 LAB — CBC
HCT: 29.3 % — ABNORMAL LOW (ref 39.0–52.0)
Hemoglobin: 10.2 g/dL — ABNORMAL LOW (ref 13.0–17.0)
MCH: 33.2 pg (ref 26.0–34.0)
MCHC: 34.8 g/dL (ref 30.0–36.0)
MCV: 95.4 fL (ref 80.0–100.0)
Platelets: 252 10*3/uL (ref 150–400)
RBC: 3.07 MIL/uL — ABNORMAL LOW (ref 4.22–5.81)
RDW: 17.6 % — ABNORMAL HIGH (ref 11.5–15.5)
WBC: 7 10*3/uL (ref 4.0–10.5)
nRBC: 0 % (ref 0.0–0.2)

## 2019-07-17 LAB — GLUCOSE, CAPILLARY: Glucose-Capillary: 74 mg/dL (ref 70–99)

## 2019-07-17 MED ORDER — ACETAMINOPHEN 325 MG PO TABS
650.0000 mg | ORAL_TABLET | ORAL | Status: DC | PRN
  Filled 2019-07-17: qty 2

## 2019-07-17 MED ORDER — FOLIC ACID 1 MG PO TABS: 1.0000 mg | ORAL_TABLET | Freq: Every day | ORAL | Status: DC

## 2019-07-17 MED ORDER — THIAMINE HCL 100 MG PO TABS: 100.0000 mg | ORAL_TABLET | Freq: Every day | ORAL | Status: DC

## 2019-07-17 NOTE — Care Management (Signed)
RNCM received call from RN stating that she was prompted to call patient guardian when she tried to discharge him. I spoke with patient's wife Ivan Hamilton [phone] and she states patient does not have a legal guardian however patient is "not always able to make health care and medical decisions and her daughter helps him in those times".  RN updated. Wife will need to be called by RN when ready for discharge so she can bring him some clothes to go home in.

## 2019-07-17 NOTE — Progress Notes (Signed)
Pt is being discharged home.  Discharge papers given and explained to pt.  Pt verbalized understanding.  Meds reviewed with pt.  New OTC meds.  Pt made aware. Awaiting transportation.

## 2019-07-17 NOTE — Discharge Summary (Signed)
Physician Discharge Summary  Ivan Hamilton [id number] DOB: Jan 22, 1966 DOA: 07/11/2019  PCP: Tracie Harrier, MD  Admit date: 07/11/2019 Discharge date: 07/17/2019  Discharge disposition: Home   Recommendations for Outpatient Follow-Up:   Outpatient follow-up with PCP and gastroenterologist  Discharge Diagnosis:   Principal Problem:   Abdominal pain Active Problems:   Hypothyroid   Gastric reflux   Hypokalemia   Depression   Syncope   Alcohol abuse   Thrombocytopenia (HCC)   Normocytic anemia   Tobacco abuse   CVA (cerebral vascular accident) ([place])   HTN (hypertension)   Diverticulitis   Transaminitis   Hepatic cirrhosis ([place])   Metabolic acidosis    Discharge Condition: Stable.  Diet recommendation: Low-salt diet  Code status: Full code.    Hospital Course:   Ivan Hamilton is a 53 y.o. male with medical history significant of stroke, hypothyroidism, depression, TIA, alcohol use disorder, PTSD, hypertension, who presents with abdominal pain, syncope and black stools.  CT scan of the abdomen and pelvis showed diverticulitis.  He was treated with empiric IV antibiotics (ciprofloxacin and Flagyl).  He also has alcoholic liver cirrhosis.  He was evaluated by the gastroenterologist and he underwent EGD 07/12/2019.  EGD showed mild portal hypertensive gastropathy and grade 1 esophageal varices.  He developed alcohol withdrawal syndrome requiring IV Precedex drip in the ICU.  He also developed hypotension requiring IV fluids and Neo-Synephrine infusion.  He was started on propranolol for esophageal varices but this was discontinued because of symptomatic hypotension.  He was also on prazosin prior to admission.  However, he said he did not know why he was on prazosin. This was discontinued because of symptomatic hypotension.  He had orthostatic hypotension and dizziness but this has improved.  He had hyponatremia, hypokalemia and hypomagnesemia which have all  been successfully treated.  He has been able to ambulate without any problems.  2D echo showed EF estimated at 60 to 65% and grade 1 diastolic dysfunction.  He has a history of stroke but he is not on any antiplatelets or anticoagulants.  He said he does not know why.  I suppose this is because of high risk for bleeding and alcohol use disorder.  His condition has improved and he is deemed stable for discharge to home today.  He has been advised to follow-up with PCP and gastroenterologist.  He can be reevaluated in the future as an outpatient for consideration for treatment with propranolol and antiplatelets.  He has been advised to avoid alcohol.      Medical Consultants:    Gastroenterologist   Discharge Exam:   Vitals:   07/17/19 0059 07/17/19 0756  BP: 95/73 (!) 120/91  Pulse: 67 70  Resp: 16 16  Temp: 98.3 F (36.8 C) 98 F (36.7 C)  SpO2: 100% 100%   Vitals:   07/16/19 1232 07/16/19 1534 07/17/19 0059 07/17/19 0756  BP: 97/71 94/77 95/73  (!) 120/91  Pulse: 75 68 67 70  Resp:  17 16 16   Temp:  99.2 F (37.3 C) 98.3 F (36.8 C) 98 F (36.7 C)  TempSrc:  Oral Oral Oral  SpO2:  100% 100% 100%  Weight:      Height:         GEN: NAD SKIN: No rash EYES: EOMI ENT: MMM CV: RRR PULM: CTA B ABD: soft, ND, NT, +BS CNS: AAO x 3, non focal EXT: No edema or tenderness   The results of significant diagnostics from this hospitalization (including imaging, microbiology, ancillary  and laboratory) are listed below for reference.     Procedures and Diagnostic Studies:   ECHOCARDIOGRAM COMPLETE  Result Date: 07/12/2019   ECHOCARDIOGRAM REPORT   Patient Name:   Ivan Hamilton Date of Exam: 07/12/2019 Medical Rec #:  [id number]        Height:       65.0 in Accession #:    [id number]       Weight:       137.0 lb Date of Birth:  08-Oct-1965        BSA:          1.68 m Patient Age:    4 years         BP:           90/68 mmHg Patient Gender: M                HR:           87  bpm. Exam Location:  [facility] Procedure: 2D Echo Indications:     Syncope 780.2/R55  History:         Patient has prior history of Echocardiogram examinations, most                  recent 10/23/2017. Stroke; Risk Factors:Hypertension.  Sonographer:     Avanell Shackleton Referring Phys:  Unknown Foley NIU Diagnosing Phys: Skeet Latch MD IMPRESSIONS  1. Left ventricular ejection fraction, by visual estimation, is 60 to 65%. The left ventricle has normal function. Left ventricular septal wall thickness was normal. Normal left ventricular posterior wall thickness. There is no left ventricular hypertrophy.  2. Left ventricular diastolic parameters are consistent with Grade I diastolic dysfunction (impaired relaxation).  3. The left ventricle has no regional wall motion abnormalities.  4. Global right ventricle has normal systolic function.The right ventricular size is normal. No increase in right ventricular wall thickness.  5. Left atrial size was normal.  6. Right atrial size was normal.  7. The mitral valve is normal in structure. Trivial mitral valve regurgitation. No evidence of mitral stenosis.  8. The tricuspid valve is normal in structure. Tricuspid valve regurgitation is mild.  9. The aortic valve is normal in structure. Aortic valve regurgitation is not visualized. No evidence of aortic valve sclerosis or stenosis. 10. Pulmonic regurgitation is mild. 11. The pulmonic valve was normal in structure. Pulmonic valve regurgitation is mild. 12. Mildly elevated pulmonary artery systolic pressure. 13. The inferior vena cava is normal in size with greater than 50% respiratory variability, suggesting right atrial pressure of 3 mmHg. FINDINGS  Left Ventricle: Left ventricular ejection fraction, by visual estimation, is 60 to 65%. The left ventricle has normal function. The left ventricle has no regional wall motion abnormalities. Normal left ventricular posterior wall thickness. There is no left ventricular hypertrophy. Left  ventricular diastolic parameters are consistent with Grade I diastolic dysfunction (impaired relaxation). Normal left atrial pressure. Right Ventricle: The right ventricular size is normal. No increase in right ventricular wall thickness. Global RV systolic function is has normal systolic function. The tricuspid regurgitant velocity is 2.37 m/s, and with an assumed right atrial pressure  of 10 mmHg, the estimated right ventricular systolic pressure is mildly elevated at 32.5 mmHg. Left Atrium: Left atrial size was normal in size. Right Atrium: Right atrial size was normal in size Pericardium: There is no evidence of pericardial effusion. Mitral Valve: The mitral valve is normal in structure. Trivial mitral valve regurgitation. No evidence of mitral valve stenosis by  observation. Tricuspid Valve: The tricuspid valve is normal in structure. Tricuspid valve regurgitation is mild. Aortic Valve: The aortic valve is normal in structure. Aortic valve regurgitation is not visualized. The aortic valve is structurally normal, with no evidence of sclerosis or stenosis. Pulmonic Valve: The pulmonic valve was normal in structure. Pulmonic valve regurgitation is mild. Pulmonic regurgitation is mild. Aorta: The aortic root, ascending aorta and aortic arch are all structurally normal, with no evidence of dilitation or obstruction. Venous: The inferior vena cava is normal in size with greater than 50% respiratory variability, suggesting right atrial pressure of 3 mmHg. IAS/Shunts: No atrial level shunt detected by color flow Doppler. There is no evidence of a patent foramen ovale. No ventricular septal defect is seen or detected. There is no evidence of an atrial septal defect.  LEFT VENTRICLE PLAX 2D LVIDd:         4.48 cm  Diastology LVIDs:         2.55 cm  LV e' lateral:   14.30 cm/s LV PW:         0.68 cm  LV E/e' lateral: 4.0 LV IVS:        0.72 cm  LV e' medial:    8.49 cm/s LVOT diam:     2.10 cm  LV E/e' medial:  6.8 LV SV:          68 ml LV SV Index:   40.19 LVOT Area:     3.46 cm  RIGHT VENTRICLE            IVC RV S prime:     8.38 cm/s  IVC diam: 1.46 cm TAPSE (M-mode): 1.7 cm LEFT ATRIUM             Index       RIGHT ATRIUM          Index LA diam:        4.10 cm 2.43 cm/m  RA Area:     9.26 cm LA Vol (A2C):   44.8 ml 26.60 ml/m RA Volume:   15.10 ml 8.97 ml/m LA Vol (A4C):   39.5 ml 23.45 ml/m LA Biplane Vol: 42.9 ml 25.47 ml/m   AORTA Ao Root diam: 3.70 cm MITRAL VALVE                        TRICUSPID VALVE MV Area (PHT): 2.99 cm             TR Peak grad:   22.5 mmHg MV PHT:        73.66 msec           TR Vmax:        246.00 cm/s MV Decel Time: 254 msec MV E velocity: 57.70 cm/s 103 cm/s  SHUNTS MV A velocity: 73.70 cm/s 70.3 cm/s Systemic Diam: 2.10 cm MV E/A ratio:  0.78       1.5  Skeet Latch MD Electronically signed by Skeet Latch MD Signature Date/Time: 07/12/2019/3:16:33 PM    Final      Labs:   Basic Metabolic Panel: Recent Labs  Lab 07/11/19 1835 07/13/19 0158 07/14/19 0708 07/15/19 0032 07/15/19 1720 07/16/19 0258 07/17/19 0209  NA 136 141 135 132*  --  141 140  K 3.4* 3.8 3.2* 2.9* 3.9 4.0 3.7  CL 99 110 106 100  --  109 110  CO2 25 21* 18* 22  --  21* 21*  GLUCOSE 134* 104* 120* 196*  --  113* 109*  BUN <5* <5* <5* 7  --  <5* <5*  CREATININE 0.60* 0.57* 0.50* 0.76  --  0.64 0.67  CALCIUM 8.4* 8.2* 7.6* 7.3*  --  8.0* 8.2*  MG 1.4* 1.7 1.9 1.5* 2.6*  --   --   PHOS 1.6*  --  2.6  --   --   --   --    GFR Estimated Creatinine Clearance: 85 mL/min (by C-G formula based on SCr of 0.67 mg/dL). Liver Function Tests: Recent Labs  Lab 07/13/19 0158 07/14/19 0708 07/15/19 0032 07/16/19 0258 07/17/19 0209  AST 138* 142* 136* 135* 93*  ALT 36 39 34 38 33  ALKPHOS 107 116 116 131* 126  BILITOT 4.9* 4.5* 3.9* 3.9* 3.1*  PROT 6.7 6.9 5.6* 7.0 6.6  ALBUMIN 2.8* 2.8* 2.4* 3.0* 2.7*   Recent Labs  Lab 07/11/19 0352  LIPASE 35   Recent Labs  Lab 07/11/19 1835  AMMONIA  51*   Coagulation profile Recent Labs  Lab 07/11/19 1056  INR 1.2    CBC: Recent Labs  Lab 07/13/19 0158 07/14/19 0708 07/15/19 0032 07/15/19 0952 07/16/19 0258 07/17/19 0209  WBC 3.8* 4.3 5.4  --  7.6 7.0  NEUTROABS  --   --  4.0  --   --   --   HGB 9.7* 11.2* 9.7* 10.7* 10.8* 10.2*  HCT 29.3* 33.4* 28.8* 29.8* 30.7* 29.3*  MCV 100.0 99.1 97.3  --  94.2 95.4  PLT 89* 135* 148*  --  226 252   Cardiac Enzymes: No results for input(s): CKTOTAL, CKMB, CKMBINDEX, TROPONINI in the last 168 hours. BNP: Invalid input(s): POCBNP CBG: Recent Labs  Lab 07/12/19 0808 07/13/19 0909 07/15/19 0744 07/16/19 0751 07/17/19 0753  GLUCAP 94 92 115* 92 74   D-Dimer No results for input(s): DDIMER in the last 72 hours. Hgb A1c No results for input(s): HGBA1C in the last 72 hours. Lipid Profile No results for input(s): CHOL, HDL, LDLCALC, TRIG, CHOLHDL, LDLDIRECT in the last 72 hours. Thyroid function studies No results for input(s): TSH, T4TOTAL, T3FREE, THYROIDAB in the last 72 hours.  Invalid input(s): FREET3 Anemia work up No results for input(s): VITAMINB12, FOLATE, FERRITIN, TIBC, IRON, RETICCTPCT in the last 72 hours. Microbiology Recent Results (from the past 240 hour(s))  SARS CORONAVIRUS 2 (TAT 6-24 HRS) Nasopharyngeal Nasopharyngeal Swab     Status: None   Collection Time: 07/11/19 11:22 AM   Specimen: Nasopharyngeal Swab  Result Value Ref Range Status   SARS Coronavirus 2 NEGATIVE NEGATIVE Final    Comment: (NOTE) SARS-CoV-2 target nucleic acids are NOT DETECTED. The SARS-CoV-2 RNA is generally detectable in upper and lower respiratory specimens during the acute phase of infection. Negative results do not preclude SARS-CoV-2 infection, do not rule out co-infections with other pathogens, and should not be used as the sole basis for treatment or other patient management decisions. Negative results must be combined with clinical observations, patient history, and  epidemiological information. The expected result is Negative. Fact Sheet for Patients: SugarRoll.be Fact Sheet for Healthcare Providers: https://www.woods-mathews.com/ This test is not yet approved or cleared by the [place] FDA and  has been authorized for detection and/or diagnosis of SARS-CoV-2 by FDA under an Emergency Use Authorization (EUA). This EUA will remain  in effect (meaning this test can be used) for the duration of the COVID-19 declaration under Section 56 4(b)(1) of the Act, 21 U.S.C. section 360bbb-3(b)(1), unless the authorization is terminated or revoked sooner. Performed at  [facility], [place] [address] [place], [place] [address]   CULTURE, BLOOD (ROUTINE X 2) w Reflex to ID Panel     Status: None   Collection Time: 07/11/19  1:45 PM   Specimen: BLOOD  Result Value Ref Range Status   Specimen Description BLOOD BLOOD RIGHT FOREARM  Final   Special Requests   Final    BOTTLES DRAWN AEROBIC AND ANAEROBIC Blood Culture adequate volume   Culture   Final    NO GROWTH 5 DAYS Performed at [facility], [address] [place], [place] [address]    Report Status 07/16/2019 FINAL  Final  CULTURE, BLOOD (ROUTINE X 2) w Reflex to ID Panel     Status: None   Collection Time: 07/11/19  1:45 PM   Specimen: BLOOD  Result Value Ref Range Status   Specimen Description BLOOD BLOOD LEFT FOREARM  Final   Special Requests   Final    BOTTLES DRAWN AEROBIC AND ANAEROBIC Blood Culture adequate volume   Culture   Final    NO GROWTH 5 DAYS Performed at [facility], [address] [place], [place] [address]    Report Status 07/16/2019 FINAL  Final  MRSA PCR Screening     Status: Abnormal   Collection Time: 07/13/19  9:15 AM   Specimen: Nasal Mucosa; Nasopharyngeal  Result Value Ref Range Status   MRSA by PCR POSITIVE (A) NEGATIVE Final    Comment:        The GeneXpert MRSA Assay (FDA approved for NASAL  specimens only), is one component of a comprehensive MRSA colonization surveillance program. It is not intended to diagnose MRSA infection nor to guide or monitor treatment for MRSA infections. RESULT CALLED TO, READ BACK BY AND VERIFIED WITH: MEGAN SHEFFIELD ON 07/13/2019 AT 1040 QSD Performed at [facility], [address] [place], [place] [address]      Discharge Instructions:   Discharge Instructions    Diet - low sodium heart healthy   Complete by: As directed    Discharge instructions   Complete by: As directed    Stop drinking alcohol   Increase activity slowly   Complete by: As directed      Allergies as of 07/17/2019   No Known Allergies     Medication List    STOP taking these medications   prazosin 1 MG capsule Commonly known as: MINIPRESS   propranolol 10 MG tablet Commonly known as: INDERAL     TAKE these medications   busPIRone 15 MG tablet Commonly known as: BUSPAR Take 1 tablet (15 mg total) by mouth 2 (two) times daily.   FLUoxetine 40 MG capsule Commonly known as: PROZAC Take 1 capsule (40 mg total) by mouth daily.   folic acid 1 MG tablet Commonly known as: FOLVITE Take 1 tablet (1 mg total) by mouth daily.   gabapentin 300 MG capsule Commonly known as: NEURONTIN Take 2 capsules (600 mg total) by mouth 3 (three) times daily.   hydrOXYzine 25 MG tablet Commonly known as: ATARAX/VISTARIL Take 25 mg by mouth every 8 (eight) hours as needed for anxiety.   levothyroxine 125 MCG tablet Commonly known as: SYNTHROID Take 1 tablet (125 mcg total) by mouth daily before breakfast.   pantoprazole 40 MG tablet Commonly known as: PROTONIX Take 40 mg by mouth 2 (two) times daily.   thiamine 100 MG tablet Take 1 tablet (100 mg total) by mouth daily.   traZODone 50 MG tablet Commonly  known as: DESYREL Take 100 mg by mouth at bedtime as needed for sleep.         Time coordinating discharge: 32 minutes  Signed:  Desarai Barrack  [facility] 07/17/2019, 2:36 PM

## 2019-09-07 ENCOUNTER — Emergency Department
Admission: EM | Admit: 2019-09-07 | Discharge: 2019-09-07 | Disposition: A | Discharge status: Home / Self Care | Payer: Medicare HMO | Attending: Emergency Medicine | Admitting: Emergency Medicine

## 2019-09-07 ENCOUNTER — Emergency Department: Payer: Medicare HMO

## 2019-09-07 ENCOUNTER — Other Ambulatory Visit: Payer: Self-pay

## 2019-09-07 DIAGNOSIS — W19XXXA Unspecified fall, initial encounter: Secondary | ICD-10-CM | POA: Diagnosis not present

## 2019-09-07 DIAGNOSIS — R55 Syncope and collapse: Secondary | ICD-10-CM | POA: Diagnosis not present

## 2019-09-07 DIAGNOSIS — Z8673 Personal history of transient ischemic attack (TIA), and cerebral infarction without residual deficits: Secondary | ICD-10-CM | POA: Diagnosis not present

## 2019-09-07 DIAGNOSIS — Y939 Activity, unspecified: Secondary | ICD-10-CM | POA: Diagnosis not present

## 2019-09-07 DIAGNOSIS — Y92003 Bedroom of unspecified non-institutional (private) residence as the place of occurrence of the external cause: Secondary | ICD-10-CM | POA: Insufficient documentation

## 2019-09-07 DIAGNOSIS — I10 Essential (primary) hypertension: Secondary | ICD-10-CM | POA: Diagnosis not present

## 2019-09-07 DIAGNOSIS — S01112A Laceration without foreign body of left eyelid and periocular area, initial encounter: Secondary | ICD-10-CM | POA: Diagnosis not present

## 2019-09-07 DIAGNOSIS — F10929 Alcohol use, unspecified with intoxication, unspecified: Secondary | ICD-10-CM

## 2019-09-07 DIAGNOSIS — Y908 Blood alcohol level of 240 mg/100 ml or more: Secondary | ICD-10-CM | POA: Insufficient documentation

## 2019-09-07 DIAGNOSIS — E039 Hypothyroidism, unspecified: Secondary | ICD-10-CM | POA: Diagnosis not present

## 2019-09-07 DIAGNOSIS — S0181XA Laceration without foreign body of other part of head, initial encounter: Secondary | ICD-10-CM

## 2019-09-07 DIAGNOSIS — Y999 Unspecified external cause status: Secondary | ICD-10-CM | POA: Insufficient documentation

## 2019-09-07 LAB — CBC WITH DIFFERENTIAL/PLATELET
Abs Immature Granulocytes: 0.01 10*3/uL (ref 0.00–0.07)
Basophils Absolute: 0.1 10*3/uL (ref 0.0–0.1)
Basophils Relative: 1 %
Eosinophils Absolute: 0 10*3/uL (ref 0.0–0.5)
Eosinophils Relative: 0 %
HCT: 37.1 % — ABNORMAL LOW (ref 39.0–52.0)
Hemoglobin: 12.3 g/dL — ABNORMAL LOW (ref 13.0–17.0)
Immature Granulocytes: 0 %
Lymphocytes Relative: 50 %
Lymphs Abs: 4.2 10*3/uL — ABNORMAL HIGH (ref 0.7–4.0)
MCH: 29.1 pg (ref 26.0–34.0)
MCHC: 33.2 g/dL (ref 30.0–36.0)
MCV: 87.9 fL (ref 80.0–100.0)
Monocytes Absolute: 0.5 10*3/uL (ref 0.1–1.0)
Monocytes Relative: 6 %
Neutro Abs: 3.7 10*3/uL (ref 1.7–7.7)
Neutrophils Relative %: 43 %
Platelets: 190 10*3/uL (ref 150–400)
RBC: 4.22 MIL/uL (ref 4.22–5.81)
RDW: 14.4 % (ref 11.5–15.5)
WBC: 8.4 10*3/uL (ref 4.0–10.5)
nRBC: 0 % (ref 0.0–0.2)

## 2019-09-07 LAB — BASIC METABOLIC PANEL
Anion gap: 11 (ref 5–15)
BUN: 7 mg/dL (ref 6–20)
CO2: 25 mmol/L (ref 22–32)
Calcium: 8.7 mg/dL — ABNORMAL LOW (ref 8.9–10.3)
Chloride: 108 mmol/L (ref 98–111)
Creatinine, Ser: 0.71 mg/dL (ref 0.61–1.24)
GFR calc Af Amer: >60 mL/min (ref >60)
GFR calc non Af Amer: >60 mL/min (ref >60)
Glucose, Bld: 127 mg/dL — ABNORMAL HIGH (ref 70–99)
Potassium: 3.4 mmol/L — ABNORMAL LOW (ref 3.5–5.1)
Sodium: 144 mmol/L (ref 135–145)

## 2019-09-07 LAB — ETHANOL: Alcohol, Ethyl (B): 441 mg/dL (ref <10)

## 2019-09-07 MED ORDER — LIDOCAINE HCL (PF) 1 % IJ SOLN
INTRAMUSCULAR | Status: AC
  Filled 2019-09-07: qty 5

## 2019-09-07 MED ORDER — TETANUS-DIPHTH-ACELL PERTUSSIS 5-2.5-18.5 LF-MCG/0.5 IM SUSP
0.5000 mL | Freq: Once | INTRAMUSCULAR | Status: DC
  Filled 2019-09-07: qty 0.5

## 2019-09-07 MED ORDER — LIDOCAINE HCL (PF) 1 % IJ SOLN
5.0000 mL | Freq: Once | INTRAMUSCULAR | Status: AC
  Administered 2019-09-07: 5 mL

## 2019-09-07 MED ORDER — ONDANSETRON HCL 4 MG/2ML IJ SOLN
4.0000 mg | Freq: Once | INTRAMUSCULAR | Status: DC
  Filled 2019-09-07: qty 2

## 2019-09-07 NOTE — ED Notes (Signed)
assited m d with sutures.

## 2019-09-07 NOTE — Discharge Instructions (Addendum)
Please call your health plan mental health hotline to arrange follow up evaluation with psychiatry, or see your doctor for a referral.

## 2019-09-07 NOTE — ED Triage Notes (Signed)
Pt from home with fall, laceration noted to forehead, pt with etoh intoxication. Pt alert and oriented to self and place.

## 2019-09-07 NOTE — ED Notes (Signed)
Pt will not consent to tdap at this time, will try again later.

## 2019-09-07 NOTE — ED Notes (Signed)
Pt sleeping. 

## 2019-09-07 NOTE — ED Provider Notes (Signed)
Procedures     ----------------------------------------- 10:08 AM on 09/07/2019 -----------------------------------------   Patient has clear speech, tolerating oral intake, steady gait, clinically sober.  Vital signs unremarkable.  Complains of right upper quadrant pain that he admits is recurrent and gets worse when he drinks.  Status post cholecystectomy, abdomen exam is benign.  Breathing is unlabored, CTAB.  Recommend close follow-up with primary care and seek referral to psychiatry.  Medically and psychiatrically stable at this time.  Wife has arrived to take patient home.   Carrie Mew, MD 09/07/19 1009

## 2019-09-07 NOTE — ED Notes (Signed)
Pt yelling out "pay attention to me, i'm going to leave". Pt states he will walk home. Pt informed that he cannot walk home at this time, will attempt to call his wife again for ride.

## 2019-09-07 NOTE — ED Notes (Signed)
Pt cleared by MD to eat and drink pt given ginger ale and sandwich tray at this time. Pt is alert and oriented. RN attempted to reach pt wife and two daughters multiple times but unable to get anyone on the phone. Pt informed family not answering phone, pt states " I could take taxi". However pt wallet is at home.

## 2019-09-07 NOTE — ED Notes (Signed)
Pt intermittently yelling and sleeping. Urinal at left side, siderails up, yellow nonskid socks in place, pt will not leave yellow arm band in place. Bed in low and locked position.

## 2019-09-07 NOTE — ED Notes (Signed)
Report to lorrie, rn.  

## 2019-09-07 NOTE — ED Provider Notes (Signed)
[facility] Emergency Department Provider Note   ____________________________________________   First MD Initiated Contact with Patient 09/07/19 0104     (approximate)  I have reviewed the triage vital signs and the nursing notes.   HISTORY  Chief Complaint Fall and Alcohol Intoxication    HPI Ivan Hamilton is a 54 y.o. male with past medical history of alcohol abuse, hypertension, cirrhosis who presents to the ED for fall.  Limited due to patient's alcohol intoxication.  Per EMS, he was found down on the ground in the bedroom by his wife with a laceration to the area above his left eyebrow.  Patient admits to drinking alcohol, but is unable to say what happened to him tonight.  He complains of pain to the area of the laceration, but otherwise denies any complaints.        Past Medical History:  Diagnosis Date  . Alcohol withdrawal ([place])   . B12 deficiency   . Chronic pain   . CVA (cerebral vascular accident) ([place])   . Hypertension   . Hypothyroidism   . Varicose veins     Patient Active Problem List   Diagnosis Date Noted  . Metabolic acidosis   . Hepatic cirrhosis ([place])   . Diverticulitis   . Transaminitis   . Syncope 07/11/2019  . Abdominal pain 07/11/2019  . Alcohol abuse 07/11/2019  . Thrombocytopenia ([place]) 07/11/2019  . Normocytic anemia 07/11/2019  . Tobacco abuse 07/11/2019  . HTN (hypertension) 07/11/2019  . CVA (cerebral vascular accident) ([place])   . Suicidal behavior 08/28/2018  . Severe recurrent major depressive disorder with psychotic features ([place]) 08/28/2018  . Alcohol intoxication ([place]) 08/28/2018  . Suicidal ideation 08/28/2018  . Duodenitis   . Gastritis without bleeding   . Hypokalemia 10/22/2017  . Intractable vomiting with nausea   . Depression 04/10/2016  . Alcohol withdrawal delirium ([place])   . Alcohol withdrawal ([place]) 09/02/2015  . Alcohol use disorder, severe, dependence ([place]) 09/02/2015  .  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 PTSD (post-traumatic stress disorder) 09/01/2015  . Hypothyroid 09/01/2015  . Gastric reflux 09/01/2015  . DDD (degenerative disc disease), lumbosacral 09/01/2015  . 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 Vitamin B12 deficiency 08/17/2015  . Pain of lumbar spine 05/19/2015  . 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 Varicose vein of leg 04/25/2015  . Abnormal LFTs (liver function tests) 03/31/2015  . Confusion state 03/31/2015  . Chronic pain of left lower extremity 03/31/2015    Past Surgical History:  Procedure Laterality Date  . CHOLECYSTECTOMY    . ESOPHAGOGASTRODUODENOSCOPY (EGD) WITH PROPOFOL N/A 10/24/2017   Procedure: ESOPHAGOGASTRODUODENOSCOPY (EGD) WITH PROPOFOL;  Surgeon: [name], MD;  [facility]COPY;  Service: Endoscopy;  Laterality: N/A;  . ESOPHAGOGASTRODUODENOSCOPY (EGD) WITH PROPOFOL N/A 07/12/2019   Procedure: ESOPHAGOGASTRODUODENOSCOPY (EGD) WITH PROPOFOL;  Surgeon: Jonathon Bellows, MD;  Location: [facility] ENDOSCOPY;  Service: Gastroenterology;  Laterality: N/A;  . LEG SURGERY      Prior to Admission medications   Medication Sig Start Date End Date Taking? Authorizing Provider  busPIRone (BUSPAR) 15 MG tablet Take 1 tablet (15 mg total) by mouth 2 (two) times daily. 09/24/18   Lavella Hammock, MD  FLUoxetine (PROZAC) 40 MG capsule Take 1 capsule (40 mg total) by mouth daily. 09/24/18   Lavella Hammock, MD  folic acid (FOLVITE) 1 MG tablet Take 1 tablet (1 mg total) by  mouth daily. 07/17/19   Jennye Boroughs, MD  gabapentin (NEURONTIN) 300 MG capsule Take 2 capsules (600 mg total) by mouth 3 (three) times daily. 09/24/18   Lavella Hammock, MD  hydrOXYzine (ATARAX/VISTARIL) 25 MG tablet Take 25 mg by mouth every 8 (eight) hours as needed for anxiety. 01/28/19   [provider]  levothyroxine (SYNTHROID,  LEVOTHROID) 125 MCG tablet Take 1 tablet (125 mcg total) by mouth daily before breakfast. 09/24/18   Lavella Hammock, MD  pantoprazole (PROTONIX) 40 MG tablet Take 40 mg by mouth 2 (two) times daily. 01/28/19   [provider]  thiamine 100 MG tablet Take 1 tablet (100 mg total) by mouth daily. 07/17/19   Jennye Boroughs, MD  traZODone (DESYREL) 50 MG tablet Take 100 mg by mouth at bedtime as needed for sleep. 06/01/19   [provider]    Allergies Patient has no known allergies.  No family history on file.  Social History Social History   Tobacco Use  . Smoking status: Never Smoker  . Smokeless tobacco: Never Used  Substance Use Topics  . Alcohol use: Yes    Comment: daily- 1 pint of vodka  . Drug use: No    Review of Systems  Constitutional: No fever/chills Eyes: No visual changes. ENT: No sore throat. Cardiovascular: Denies chest pain. Respiratory: Denies shortness of breath. Gastrointestinal: No abdominal pain.  No nausea, no vomiting.  No diarrhea.  No constipation. Genitourinary: Negative for dysuria. Musculoskeletal: Negative for back pain. Skin: Negative for rash.  Positive for laceration. Neurological: Negative for headaches, focal weakness or numbness.  ____________________________________________   PHYSICAL EXAM:  VITAL SIGNS: ED Triage Vitals  Enc Vitals Group     BP      Pulse      Resp      Temp      Temp src      SpO2      Weight      Height      Head Circumference      Peak Flow      Pain Score      Pain Loc      Pain Edu?      Excl. in [place]?     Constitutional: Alert and oriented, intoxicated appearing. Eyes: Conjunctivae are normal.  Pupils equal round and reactive to light. Head: Approximately 6 cm laceration above left eyebrow.  No facial bony tenderness or step-offs. Nose: No congestion/rhinnorhea. Mouth/Throat: Mucous membranes are moist. Neck: Normal ROM Cardiovascular: Tachycardic, regular rhythm. Grossly normal  heart sounds. Respiratory: Normal respiratory effort.  No retractions. Lungs CTAB. Gastrointestinal: Soft and nontender. No distention. Genitourinary: deferred Musculoskeletal: No lower extremity tenderness nor edema. Neurologic:  Normal speech and language. No gross focal neurologic deficits are appreciated. Skin:  Skin is warm, dry and intact. No rash noted. Psychiatric: Mood and affect are normal. Speech and behavior are normal.  ____________________________________________   LABS (all labs ordered are listed, but only abnormal results are displayed)  Labs Reviewed  BASIC METABOLIC PANEL - Abnormal; Notable for the following components:      Result Value   Potassium 3.4 (*)    Glucose, Bld 127 (*)    Calcium 8.7 (*)    All other components within normal limits  CBC WITH DIFFERENTIAL/PLATELET - Abnormal; Notable for the following components:   Hemoglobin 12.3 (*)    HCT 37.1 (*)    Lymphs Abs 4.2 (*)    All other components within normal limits  ETHANOL - Abnormal; Notable for the following components:   Alcohol, Ethyl (B) 441 (*)    All other components within normal limits   ____________________________________________  EKG  ED ECG REPORT I, Blake Divine, the attending physician, personally viewed and interpreted this ECG.   Date: 09/07/2019  EKG Time: 1:27  Rate: 104  Rhythm: normal sinus rhythm  Axis: Normal  Intervals:none  ST&T Change: None   PROCEDURES  Procedure(s) performed (including Critical Care):  Marland KitchenMarland KitchenLaceration Repair  Date/Time: 09/07/2019 6:22 AM Performed by: Blake Divine, MD Authorized by: Blake Divine, MD   Consent:    Consent obtained:  Verbal Anesthesia (see MAR for exact dosages):    Anesthesia method:  Local infiltration   Local anesthetic:  Lidocaine 1% w/o epi Laceration details:    Location:  Face   Face location:  L upper eyelid   Length (cm):  6 Repair type:    Repair type:  Simple Pre-procedure details:     Preparation:  Patient was prepped and draped in usual sterile fashion and imaging obtained to evaluate for foreign bodies Exploration:    Hemostasis achieved with:  Direct pressure   Wound exploration: wound explored through full range of motion     Contaminated: no   Treatment:    Area cleansed with:  Saline   Amount of cleaning:  Standard   Irrigation solution:  Sterile saline   Irrigation method:  Pressure wash   Visualized foreign bodies/material removed: no   Skin repair:    Repair method:  Sutures   Suture size:  4-0   Suture material:  Nylon   Number of sutures:  8 Approximation:    Approximation:  Close Post-procedure details:    Dressing:  Non-adherent dressing   Patient tolerance of procedure:  Tolerated well, no immediate complications     ____________________________________________   INITIAL IMPRESSION / ASSESSMENT AND PLAN / ED COURSE       54 year old male with history of alcohol abuse and cirrhosis presents to the ED after apparent fall, admits to alcohol intoxication.  He has a laceration above his left eyebrow, but no other evidence of traumatic injury.  He has no extremity, chest wall, or abdominal tenderness on exam.  CT head and C-spine are negative for acute process.  Plan to update tetanus, but patient is unable to consent for this at this time.  Laceration above left eyebrow was repaired without difficulty, no evidence of underlying facial fracture.  Lab work is unremarkable outside of significant alcohol intoxication, he will need to be monitored until clinically sober.  Patient turned over to oncoming provider pending sobriety.      ____________________________________________   FINAL CLINICAL IMPRESSION(S) / ED DIAGNOSES  Final diagnoses:  Fall, initial encounter  Alcoholic intoxication with complication ([place])  Facial laceration, initial encounter     ED Discharge Orders    None       Note:  This document was prepared using Dragon  voice recognition software and may include unintentional dictation errors.   Blake Divine, MD 09/07/19 [phone]

## 2019-09-07 NOTE — ED Notes (Signed)
Pt awake, yelling"thankyou".

## 2019-11-15 ENCOUNTER — Ambulatory Visit: Payer: Medicare HMO | Attending: Internal Medicine

## 2019-11-18 ENCOUNTER — Emergency Department: Payer: Medicare HMO

## 2019-11-18 ENCOUNTER — Emergency Department
Admission: EM | Admit: 2019-11-18 | Discharge: 2019-11-19 | Disposition: A | Discharge status: Home / Self Care | Payer: Medicare HMO | Attending: Emergency Medicine | Admitting: Emergency Medicine

## 2019-11-18 ENCOUNTER — Other Ambulatory Visit: Payer: Self-pay

## 2019-11-18 DIAGNOSIS — I10 Essential (primary) hypertension: Secondary | ICD-10-CM | POA: Insufficient documentation

## 2019-11-18 DIAGNOSIS — F1020 Alcohol dependence, uncomplicated: Secondary | ICD-10-CM | POA: Diagnosis not present

## 2019-11-18 DIAGNOSIS — Z79899 Other long term (current) drug therapy: Secondary | ICD-10-CM | POA: Insufficient documentation

## 2019-11-18 DIAGNOSIS — R0789 Other chest pain: Secondary | ICD-10-CM

## 2019-11-18 DIAGNOSIS — R0602 Shortness of breath: Secondary | ICD-10-CM | POA: Diagnosis not present

## 2019-11-18 DIAGNOSIS — E039 Hypothyroidism, unspecified: Secondary | ICD-10-CM | POA: Diagnosis not present

## 2019-11-18 LAB — CBC
HCT: 37.2 % — ABNORMAL LOW (ref 39.0–52.0)
Hemoglobin: 12.4 g/dL — ABNORMAL LOW (ref 13.0–17.0)
MCH: 26 pg (ref 26.0–34.0)
MCHC: 33.3 g/dL (ref 30.0–36.0)
MCV: 78 fL — ABNORMAL LOW (ref 80.0–100.0)
Platelets: 226 10*3/uL (ref 150–400)
RBC: 4.77 MIL/uL (ref 4.22–5.81)
RDW: 17.6 % — ABNORMAL HIGH (ref 11.5–15.5)
WBC: 8.7 10*3/uL (ref 4.0–10.5)
nRBC: 0 % (ref 0.0–0.2)

## 2019-11-18 LAB — HEPATIC FUNCTION PANEL
ALT: 28 U/L (ref 0–44)
AST: 55 U/L — ABNORMAL HIGH (ref 15–41)
Albumin: 4.3 g/dL (ref 3.5–5.0)
Alkaline Phosphatase: 82 U/L (ref 38–126)
Bilirubin, Direct: 0.1 mg/dL (ref 0.0–0.2)
Indirect Bilirubin: 1 mg/dL — ABNORMAL HIGH (ref 0.3–0.9)
Total Bilirubin: 1.1 mg/dL (ref 0.3–1.2)
Total Protein: 9.4 g/dL — ABNORMAL HIGH (ref 6.5–8.1)

## 2019-11-18 LAB — TROPONIN I (HIGH SENSITIVITY)
Troponin I (High Sensitivity): 4 ng/L (ref <18)
Troponin I (High Sensitivity): 4 ng/L (ref <18)

## 2019-11-18 LAB — BASIC METABOLIC PANEL
Anion gap: 13 (ref 5–15)
BUN: 11 mg/dL (ref 6–20)
CO2: 21 mmol/L — ABNORMAL LOW (ref 22–32)
Calcium: 9.3 mg/dL (ref 8.9–10.3)
Chloride: 105 mmol/L (ref 98–111)
Creatinine, Ser: 0.67 mg/dL (ref 0.61–1.24)
GFR calc Af Amer: >60 mL/min (ref >60)
GFR calc non Af Amer: >60 mL/min (ref >60)
Glucose, Bld: 96 mg/dL (ref 70–99)
Potassium: 4.1 mmol/L (ref 3.5–5.1)
Sodium: 139 mmol/L (ref 135–145)

## 2019-11-18 LAB — AMMONIA: Ammonia: 23 umol/L (ref 9–35)

## 2019-11-18 LAB — LIPASE, BLOOD: Lipase: 37 U/L (ref 11–51)

## 2019-11-18 LAB — MAGNESIUM: Magnesium: 1.8 mg/dL (ref 1.7–2.4)

## 2019-11-18 MED ORDER — SODIUM CHLORIDE 0.9% FLUSH: 3.0000 mL | Freq: Once | INTRAVENOUS | Status: DC

## 2019-11-18 MED ORDER — SODIUM CHLORIDE 0.9 % IV BOLUS
1000.0000 mL | Freq: Once | INTRAVENOUS | Status: AC
  Administered 2019-11-18: 21:00:00 1000 mL via INTRAVENOUS

## 2019-11-18 MED ORDER — LORAZEPAM 2 MG/ML IJ SOLN
2.0000 mg | Freq: Once | INTRAMUSCULAR | Status: AC
  Administered 2019-11-18: 2 mg via INTRAVENOUS
  Filled 2019-11-18: qty 1

## 2019-11-18 MED ORDER — IOHEXOL 350 MG/ML SOLN
75.0000 mL | Freq: Once | INTRAVENOUS | Status: AC | PRN
  Administered 2019-11-18: 75 mL via INTRAVENOUS

## 2019-11-18 MED ORDER — THIAMINE HCL 100 MG PO TABS: 100.0000 mg | ORAL_TABLET | Freq: Every day | ORAL | 0 refills | Status: AC

## 2019-11-18 NOTE — ED Notes (Signed)
Pt taken to CT.

## 2019-11-18 NOTE — ED Notes (Signed)
Pt to front desk to tell this RN that he is going to leave. Pt states he is feeling better. This Rn spoke with patient regarding need to stay, pt refusing to stay, stating that he has been waiting too long. This RN apologized for waits, pt states he is ready to leave.

## 2019-11-18 NOTE — ED Notes (Signed)
Pt attempted to call for ride several times with assistance of screener without success. Pt now stating that he wants to wait and be seen.

## 2019-11-18 NOTE — ED Provider Notes (Signed)
[facility] Emergency Department Provider Note  ____________________________________________   First MD Initiated Contact with Patient 11/18/19 1951     (approximate)  I have reviewed the triage vital signs and the nursing notes.   HISTORY  Chief Complaint Chest Pain and Shortness of Breath    HPI Ivan Hamilton is a 54 y.o. male  Here with multiple complaints. Primary complaint is transient loss of consciousness today. Pt is somewhat a difficult historian but reports he came in today because he was outside walking when he began to feel some mild SOB and CP. Lasted around 45 minutes. He felt some lightheadedness and might have passed out. He now feels much better. He admits to drinking earlier today and has been under significant increased stress. No current CP, SOB. No leg swelling.  He also reports worsening depression, alcohol use, and hallucinations. 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 He is not sleeping well, reports dysphoric mood.     Past Medical History:  Diagnosis Date  . Alcohol withdrawal ([place])   . B12 deficiency   . Chronic pain   . CVA (cerebral vascular accident) ([place])   . Hypertension   . Hypothyroidism   . Varicose veins     Patient Active Problem List   Diagnosis Date Noted  . Metabolic acidosis   . Hepatic cirrhosis ([place])   . Diverticulitis   . Transaminitis   . Syncope 07/11/2019  . Abdominal pain 07/11/2019  . Alcohol abuse 07/11/2019  . Thrombocytopenia ([place]) 07/11/2019  . Normocytic anemia 07/11/2019  . Tobacco abuse 07/11/2019  . HTN (hypertension) 07/11/2019  . CVA (cerebral vascular accident) ([place])   . Suicidal behavior 08/28/2018  . Severe recurrent major depressive disorder with psychotic features ([place]) 08/28/2018  . Alcohol intoxication ([place]) 08/28/2018  . Suicidal ideation 08/28/2018  . Duodenitis   . Gastritis  without bleeding   . Hypokalemia 10/22/2017  . Intractable vomiting with nausea   . Depression 04/10/2016  . Alcohol withdrawal delirium ([place])   . Alcohol withdrawal ([place]) 09/02/2015  . Alcohol use disorder, severe, dependence ([place]) 09/02/2015  . 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 PTSD (post-traumatic stress disorder) 09/01/2015  . Hypothyroid 09/01/2015  . Gastric reflux 09/01/2015  . DDD (degenerative disc disease), lumbosacral 09/01/2015  . 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 Vitamin B12 deficiency 08/17/2015  . Pain of lumbar spine 05/19/2015  . 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 Varicose vein of leg 04/25/2015  . Abnormal LFTs (liver function tests) 03/31/2015  . Confusion state 03/31/2015  . Chronic pain of left lower extremity 03/31/2015    Past Surgical History:  Procedure Laterality Date  . CHOLECYSTECTOMY    . ESOPHAGOGASTRODUODENOSCOPY (EGD) WITH PROPOFOL N/A 10/24/2017   Procedure: ESOPHAGOGASTRODUODENOSCOPY (EGD) WITH PROPOFOL;  Surgeon: [name], MD;  Location[facility] ENDOSCOPY;  Service: Endoscopy;  Laterality: N/A;  . ESOPHAGOGASTRODUODENOSCOPY (EGD) WITH PROPOFOL N/A 07/12/2019   Procedure: ESOPHAGOGASTRODUODENOSCOPY (EGD) WITH PROPOFOL;  Surgeon: Jonathon Bellows, MD;  Location: [facility] ENDOSCOPY;  Service: Gastroenterology;  Laterality: N/A;  . LEG SURGERY      Prior to Admission medications   Medication Sig Start Date End Date Taking? Authorizing Provider  busPIRone (BUSPAR) 15 MG tablet Take 1 tablet (15 mg total) by mouth 2 (two) times daily. 09/24/18   Leverne Humbles,  Guadalupe Maple, MD  FLUoxetine (PROZAC) 40 MG capsule Take 1 capsule (40 mg total) by mouth daily. 09/24/18   Lavella Hammock, MD  folic acid (FOLVITE) 1 MG tablet Take 1 tablet (1 mg total) by mouth daily. 07/17/19   Jennye Boroughs, MD  gabapentin (NEURONTIN) 300 MG capsule Take 2 capsules (600  mg total) by mouth 3 (three) times daily. 09/24/18   Lavella Hammock, MD  hydrOXYzine (ATARAX/VISTARIL) 25 MG tablet Take 25 mg by mouth every 8 (eight) hours as needed for anxiety. 01/28/19   [provider]  levothyroxine (SYNTHROID, LEVOTHROID) 125 MCG tablet Take 1 tablet (125 mcg total) by mouth daily before breakfast. 09/24/18   Lavella Hammock, MD  pantoprazole (PROTONIX) 40 MG tablet Take 40 mg by mouth 2 (two) times daily. 01/28/19   [provider]  thiamine 100 MG tablet Take 1 tablet (100 mg total) by mouth daily. 11/18/19 12/18/19  Duffy Bruce, MD  traZODone (DESYREL) 50 MG tablet Take 100 mg by mouth at bedtime as needed for sleep. 06/01/19   [provider]    Allergies Patient has no known allergies.  No family history on file.  Social History Social History   Tobacco Use  . Smoking status: Never Smoker  . Smokeless tobacco: Never Used  Substance Use Topics  . Alcohol use: Yes    Comment: daily- 1 pint of vodka  . Drug use: No    Review of Systems  Review of Systems  Constitutional: Positive for fatigue. Negative for chills and fever.  HENT: Negative for sore throat.   Respiratory: Positive for chest tightness and shortness of breath.   Cardiovascular: Positive for chest pain.  Gastrointestinal: Negative for abdominal pain.  Genitourinary: Negative for flank pain.  Musculoskeletal: Negative for neck pain.  Skin: Negative for rash and wound.  Allergic/Immunologic: Negative for immunocompromised state.  Neurological: Positive for weakness. Negative for numbness.  Hematological: Does not bruise/bleed easily.  Psychiatric/Behavioral: Positive for dysphoric mood and hallucinations.     ____________________________________________  PHYSICAL EXAM:      VITAL SIGNS: ED Triage Vitals  Enc Vitals Group     BP 11/18/19 1603 (!) 138/98     Pulse Rate 11/18/19 1603 (!) 118     Resp 11/18/19 1603 18     Temp 11/18/19 1603 98.4 F (36.9 C)      Temp Source 11/18/19 1603 Oral     SpO2 11/18/19 1603 96 %     Weight 11/18/19 1604 137 lb (62.1 kg)     Height 11/18/19 1604 5\' 5"  (1.651 m)     Head Circumference --      Peak Flow --      Pain Score 11/18/19 1610 7     Pain Loc --      Pain Edu? --      Excl. in [place]? --      Physical Exam Vitals and nursing note reviewed.  Constitutional:      General: He is not in acute distress.    Appearance: He is well-developed.  HENT:     Head: Normocephalic and atraumatic.  Eyes:     Conjunctiva/sclera: Conjunctivae normal.  Cardiovascular:     Rate and Rhythm: Regular rhythm. Tachycardia present.     Heart sounds: Normal heart sounds. No murmur. No friction rub.  Pulmonary:     Effort: Pulmonary effort is normal. No respiratory distress.     Breath sounds: Normal breath sounds. No wheezing or rales.  Abdominal:     General: There is no distension.     Palpations: Abdomen is soft.     Tenderness: There is no abdominal tenderness.  Musculoskeletal:     Cervical back: Neck supple.  Skin:    General: Skin is warm.     Capillary Refill: Capillary refill takes less than 2 seconds.  Neurological:     Mental Status: He is alert.     Motor: No abnormal muscle tone.     Comments: Mild tremors noted. AOx4. CN intact. Normal strength and sensation.       ____________________________________________   LABS (all labs ordered are listed, but only abnormal results are displayed)  Labs Reviewed  BASIC METABOLIC PANEL - Abnormal; Notable for the following components:      Result Value   CO2 21 (*)    All other components within normal limits  CBC - Abnormal; Notable for the following components:   Hemoglobin 12.4 (*)    HCT 37.2 (*)    MCV 78.0 (*)    RDW 17.6 (*)    All other components within normal limits  HEPATIC FUNCTION PANEL - Abnormal; Notable for the following components:   Total Protein 9.4 (*)    AST 55 (*)    Indirect Bilirubin 1.0 (*)    All other components  within normal limits  MAGNESIUM  LIPASE, BLOOD  AMMONIA  TROPONIN I (HIGH SENSITIVITY)  TROPONIN I (HIGH SENSITIVITY)    ____________________________________________  EKG: Sinus tachycardia, VR 117. PR 144, QRS 80, QTc 457. Normal axis. No ST elevations or depressions. ________________________________________  RADIOLOGY All imaging, including plain films, CT scans, and ultrasounds, independently reviewed by me, and interpretations confirmed via formal radiology reads.  ED MD interpretation:   CXR: Clear CT Angio Chest: No acute abnormality, no PE, no PNA  Official radiology report(s): DG Chest 2 View  Result Date: 11/18/2019 CLINICAL DATA:  Onset of chest pain and shortness of breath while cutting the grass. EXAM: CHEST - 2 VIEW COMPARISON:  07/11/2019 FINDINGS: The cardiomediastinal contours are normal. The lungs are clear. Pulmonary vasculature is normal. No consolidation, pleural effusion, or pneumothorax. No acute osseous abnormalities are seen. Cholecystectomy clips in the right upper quadrant IMPRESSION: Negative radiographs of the chest. Electronically Signed   By: Keith Rake M.D.   On: 11/18/2019 16:53   CT Angio Chest PE W and/or Wo Contrast  Result Date: 11/18/2019 CLINICAL DATA:  Chest pain, resolved shortness of breath EXAM: CT ANGIOGRAPHY CHEST WITH CONTRAST TECHNIQUE: Multidetector CT imaging of the chest was performed using the standard protocol during bolus administration of intravenous contrast. Multiplanar CT image reconstructions and MIPs were obtained to evaluate the vascular anatomy. CONTRAST:  58mL OMNIPAQUE IOHEXOL 350 MG/ML SOLN COMPARISON:  None. FINDINGS: Cardiovascular: Satisfactory opacification of the pulmonary arteries to the segmental level. No evidence of pulmonary embolism. Normal heart size. No pericardial effusion. Coronary artery calcification. Mediastinum/Nodes: No enlarged lymph nodes. Lungs/Pleura: No consolidation or mass. No pleural effusion  or pneumothorax. Upper Abdomen: No acute abnormality. Musculoskeletal: No acute osseous abnormality. Review of the MIP images confirms the above findings. IMPRESSION: No evidence of acute pulmonary embolism or other acute abnormality. Electronically Signed   By: Macy Mis M.D.   On: 11/18/2019 21:18    ____________________________________________  PROCEDURES   Procedure(s) performed (including Critical Care):  .1-3 Lead EKG Interpretation Performed by: Duffy Bruce, MD Authorized by: Duffy Bruce, MD     Interpretation: normal     ECG rate:  80-100   ECG rate assessment: normal     Rhythm: sinus rhythm     Ectopy: PVCs     Conduction: normal   Comments:     Indication: Chest pain    ____________________________________________  INITIAL IMPRESSION / MDM / ASSESSMENT AND PLAN / ED COURSE  As part of my medical decision making, I reviewed the following data within the [facility] notes reviewed and incorporated, Old chart reviewed, Notes from prior ED visits, and [place] Controlled Substance Database       *Ivan Hamilton was evaluated in Emergency Department on 11/19/2019 for the symptoms described in the history of present illness. He was evaluated in the context of the global COVID-19 pandemic, which necessitated consideration that the patient might be at risk for infection with the SARS-CoV-2 virus that causes COVID-19. Institutional protocols and algorithms that pertain to the evaluation of patients at risk for COVID-19 are in a state of rapid change based on information released by regulatory bodies including the CDC and federal and state organizations. These policies and algorithms were followed during the patient's care in the ED.  Some ED evaluations and interventions may be delayed as a result of limited staffing during the pandemic.*     Medical Decision Making:  54 yo M here with chest pain, SOB, anxiety, and possible alcoholic  hallucinosis.  Re: his CP - this could very well be related to his underlying severe PTSD/anxiety. He has a normal EKG with neg trop x 2 - doubt ACS or ischemia. Given his report of heart racing, SOB, and tachycardia here with chronic venous issues in his legs from old injuries, CT angio obtained and is negative. His abdomen is soft and LFTs, labs are overall unremarkable. His tele shows no arrhythmia.   Re: his PTSD and hallucinations - it's unclear how much of this is chronic. He denies any SI, HI to me but also reports hearing voices, seeing things. I suspect this is all 2/2 his severe PTSD complicated by chronic EtOH use. He has some mild tremors on exam but is alert, oriented, and drank today - doubt DTs. His tremors improved with ativan here (likely mild w/d from waiting in ED). I asked psychiatry to evaluate the pt given his severe PTSD with question of hallucinations/psychosis, but they do not feel he needs inpt treatment or medication at this time.  Given psychiatric clearance for outpt follow-up and reassuring medical work-up, no apparent acute medical emergency. Will have him f/u with his PCP for ongoing management.  ____________________________________________  FINAL CLINICAL IMPRESSION(S) / ED DIAGNOSES  Final diagnoses:  Atypical chest pain  Uncomplicated alcohol dependence ([place])     MEDICATIONS GIVEN DURING THIS VISIT:  Medications  sodium chloride flush (NS) 0.9 % injection 3 mL (has no administration in time range)  LORazepam (ATIVAN) injection 2 mg (2 mg Intravenous Given 11/18/19 2057)  sodium chloride 0.9 % bolus 1,000 mL (0 mLs Intravenous Stopped 11/18/19 2313)  iohexol (OMNIPAQUE) 350 MG/ML injection 75 mL (75 mLs Intravenous Contrast Given 11/18/19 2102)     ED Discharge Orders         Ordered    thiamine 100 MG tablet  Daily     11/18/19 2338           Note:  This document was prepared using Dragon voice recognition software and may include unintentional  dictation errors.   Duffy Bruce, MD 11/19/19 0200

## 2019-11-18 NOTE — ED Notes (Signed)
Pt given meal tray after ambulating tot he bathroom independently.

## 2019-11-18 NOTE — ED Triage Notes (Signed)
PT was cutting his grass when he felt CP and SOB. PT denies SOB now but still has CP. PT states episode lasted about 45 minutes. PT alert and oriented speaking in full sentences. PT did no skip breakfast.

## 2019-11-19 NOTE — BH Assessment (Signed)
Assessment Note  Ivan Hamilton is an 54 y.o. male. Mr. Elsaesser arrived to the ED and reports, "I tried to do some work and then chest pain and I was unable to breath very good.  I called my daughter to get her mom to take me to hospital because I can't breath and pain in chest.".  He reports that he has a history of suicide attempt by jumping on to concrete.  He states that he did that some months ago, because he is not working and was having financial problems.  He stated that he wanted to go to his mother's funeral in [place] but his brother would not help him financially.  He reports that he is depressed, and he has had no appetite nor has he been sleeping.  He denied symptoms of anxiety.  He denied having hallucinations today, but states that he will at times hear someone call his name and no one replies when he answers.  He denied suicidal ideation or intent today.  He denied homicidal ideation or intent.  He reports a history of alcohol abuse.  Mr. Juntunen reports a prior history of PTSD.     Diagnosis: Major Depressive Disorder, Recurrent  Past Medical History:  Past Medical History:  Diagnosis Date  . Alcohol withdrawal ([place])   . B12 deficiency   . Chronic pain   . CVA (cerebral vascular accident) ([place])   . Hypertension   . Hypothyroidism   . Varicose veins     Past Surgical History:  Procedure Laterality Date  . CHOLECYSTECTOMY    . ESOPHAGOGASTRODUODENOSCOPY (EGD) WITH PROPOFOL N/A 10/24/2017   Procedure: ESOPHAGOGASTRODUODENOSCOPY (EGD) WITH PROPOFOL;  Surgeon: [name], MD;  Location: [facility] ENDOSCOPY;  Service: Endoscopy;  Laterality: N/A;  . ESOPHAGOGASTRODUODENOSCOPY (EGD) WITH PROPOFOL N/A 07/12/2019   Procedure: ESOPHAGOGASTRODUODENOSCOPY (EGD) WITH PROPOFOL;  Surgeon: Jonathon Bellows, MD;  Location: [facility] ENDOSCOPY;  Service: Gastroenterology;  Laterality: N/A;  . LEG SURGERY      Family History: No family history on file.  Social History:  reports that he has  never smoked. He has never used smokeless tobacco. He reports current alcohol use. He reports that he does not use drugs.  Additional Social History:  Alcohol / Drug Use History of alcohol / drug use?: Yes Substance #1 Name of Substance 1: Alcohol 1 - Amount (size/oz): up to half of a fifth of tequila 1 - Last Use / Amount: Unknown  CIWA: CIWA-Ar BP: 115/81 Pulse Rate: (!) 102 Nausea and Vomiting: no nausea and no vomiting Tactile Disturbances: none Tremor: three Auditory Disturbances: not present Paroxysmal Sweats: two Visual Disturbances: not present Anxiety: no anxiety, at ease Headache, Fullness in Head: none present Agitation: normal activity Orientation and Clouding of Sensorium: oriented and can do serial additions CIWA-Ar Total: 5 COWS:    Allergies: No Known Allergies  Home Medications: (Not in a hospital admission)   OB/GYN Status:  No LMP for male patient.  General Assessment Data Location of Assessment: [facility] ED TTS Assessment: In system Is this a Tele or Face-to-Face Assessment?: Face-to-Face Is this an Initial Assessment or a Re-assessment for this encounter?: Initial Assessment Patient Accompanied by:: N/A Language Other than English: Yes What is your preferred language: Spanish(Does speak english ) Living Arrangements: Other (Comment)(Private residence) What gender do you identify as?: Male Marital status: Married Living Arrangements: Spouse/significant other Can pt return to current living arrangement?: Yes Admission Status: Voluntary Is patient capable of signing voluntary admission?: Yes Referral Source: Self/Family/Friend Insurance type:  Medicare  Medical Screening Exam [facility] Walk-in ONLY) Medical Exam completed: Yes  Crisis Care Plan Living Arrangements: Spouse/significant other Legal Guardian: Other:(Self) Name of Psychiatrist: None at this time Name of Therapist: None at this time  Education Status Is patient currently in school?:  No Is the patient employed, unemployed or receiving disability?: Receiving disability income  Risk to self with the past 6 months Suicidal Ideation: No Has patient been a risk to self within the past 6 months prior to admission? : Yes Suicidal Intent: No Has patient had any suicidal intent within the past 6 months prior to admission? : Yes Is patient at risk for suicide?: No Suicidal Plan?: No Has patient had any suicidal plan within the past 6 months prior to admission? : No Access to Means: No What has been your use of drugs/alcohol within the last 12 months?: History of alcohol abuse Previous Attempts/Gestures: Yes How many times?: (did not identify) Other Self Harm Risks: denied Triggers for Past Attempts: None known Intentional Self Injurious Behavior: None Family Suicide History: No Recent stressful life event(s): Financial Problems(PTSD, Flashbacks) Persecutory voices/beliefs?: No Depression: Yes Depression Symptoms: Feeling worthless/self pity Substance abuse history and/or treatment for substance abuse?: Yes Suicide prevention information given to non-admitted patients: Not applicable  Risk to Others within the past 6 months Homicidal Ideation: No Does patient have any lifetime risk of violence toward others beyond the six months prior to admission? : No Thoughts of Harm to Others: No Current Homicidal Intent: No Current Homicidal Plan: No Access to Homicidal Means: No Identified Victim: None identified History of harm to others?: No Assessment of Violence: None Noted Does patient have access to weapons?: No Criminal Charges Pending?: No Does patient have a court date: No Is patient on probation?: No  Psychosis Hallucinations: None noted Delusions: None noted  Mental Status Report Appearance/Hygiene: Unremarkable Eye Contact: Good Motor Activity: Unremarkable Speech: Logical/coherent(Talkative) Level of Consciousness: Alert Mood: Euthymic Affect:  Appropriate to circumstance Anxiety Level: None Thought Processes: Coherent Judgement: Unimpaired Orientation: Appropriate for developmental age Obsessive Compulsive Thoughts/Behaviors: None  Cognitive Functioning Concentration: Normal Memory: Recent Intact Is patient IDD: No Insight: Fair Impulse Control: Fair Appetite: Poor Have you had any weight changes? : No Change Sleep: Decreased Vegetative Symptoms: None  ADLScreening [facility] Assessment Services) Patient's cognitive ability adequate to safely complete daily activities?: Yes Patient able to express need for assistance with ADLs?: Yes Independently performs ADLs?: Yes (appropriate for developmental age)  Prior Inpatient Therapy Prior Inpatient Therapy: Yes Prior Therapy Dates: 2020 Prior Therapy Facilty/Provider(s): [facility] Reason for Treatment: Substance abuse  Prior Outpatient Therapy Prior Outpatient Therapy: No Does patient have an ACCT team?: No Does patient have Intensive In-House Services?  : No Does patient have [facility] services? : No Does patient have P4CC services?: No  ADL Screening (condition at time of admission) Patient's cognitive ability adequate to safely complete daily activities?: Yes Is the patient deaf or have difficulty hearing?: No Does the patient have difficulty seeing, even when wearing glasses/contacts?: No Does the patient have difficulty concentrating, remembering, or making decisions?: No Patient able to express need for assistance with ADLs?: Yes Does the patient have difficulty dressing or bathing?: No Independently performs ADLs?: Yes (appropriate for developmental age) Does the patient have difficulty walking or climbing stairs?: No Weakness of Arms/Hands: None  Home Assistive Devices/Equipment Home Assistive Devices/Equipment: None    Abuse/Neglect Assessment (Assessment to be complete while patient is alone) Abuse/Neglect Assessment Can Be Completed: (denied a history of  abuse)     Advance Directives (For Healthcare) Does Patient Have a Medical Advance Directive?: No Would patient like information on creating a medical advance directive?: No - Patient declined          Disposition:  Disposition Initial Assessment Completed for this Encounter: Yes  On Site Evaluation by:   Reviewed with Physician:    Elmer Bales 11/19/2019 12:05 AM

## 2019-11-27 ENCOUNTER — Ambulatory Visit: Payer: Self-pay

## 2020-01-07 ENCOUNTER — Other Ambulatory Visit: Payer: Self-pay

## 2020-01-07 ENCOUNTER — Encounter: Payer: Self-pay | Admitting: Emergency Medicine

## 2020-01-07 DIAGNOSIS — S0083XA Contusion of other part of head, initial encounter: Secondary | ICD-10-CM | POA: Diagnosis not present

## 2020-01-07 DIAGNOSIS — R441 Visual hallucinations: Secondary | ICD-10-CM | POA: Insufficient documentation

## 2020-01-07 DIAGNOSIS — F1914 Other psychoactive substance abuse with psychoactive substance-induced mood disorder: Secondary | ICD-10-CM | POA: Diagnosis not present

## 2020-01-07 DIAGNOSIS — R45851 Suicidal ideations: Secondary | ICD-10-CM | POA: Diagnosis not present

## 2020-01-07 DIAGNOSIS — Z20822 Contact with and (suspected) exposure to covid-19: Secondary | ICD-10-CM | POA: Insufficient documentation

## 2020-01-07 DIAGNOSIS — I10 Essential (primary) hypertension: Secondary | ICD-10-CM | POA: Insufficient documentation

## 2020-01-07 DIAGNOSIS — Z79899 Other long term (current) drug therapy: Secondary | ICD-10-CM | POA: Insufficient documentation

## 2020-01-07 DIAGNOSIS — F10231 Alcohol dependence with withdrawal delirium: Secondary | ICD-10-CM | POA: Diagnosis not present

## 2020-01-07 DIAGNOSIS — F10239 Alcohol dependence with withdrawal, unspecified: Secondary | ICD-10-CM | POA: Diagnosis not present

## 2020-01-07 DIAGNOSIS — F1024 Alcohol dependence with alcohol-induced mood disorder: Secondary | ICD-10-CM | POA: Insufficient documentation

## 2020-01-07 DIAGNOSIS — F1010 Alcohol abuse, uncomplicated: Secondary | ICD-10-CM | POA: Diagnosis not present

## 2020-01-07 DIAGNOSIS — E039 Hypothyroidism, unspecified: Secondary | ICD-10-CM | POA: Insufficient documentation

## 2020-01-07 DIAGNOSIS — S01511A Laceration without foreign body of lip, initial encounter: Secondary | ICD-10-CM | POA: Diagnosis not present

## 2020-01-07 DIAGNOSIS — Z8673 Personal history of transient ischemic attack (TIA), and cerebral infarction without residual deficits: Secondary | ICD-10-CM | POA: Insufficient documentation

## 2020-01-07 LAB — CBC
HCT: 37.3 % — ABNORMAL LOW (ref 39.0–52.0)
Hemoglobin: 12.6 g/dL — ABNORMAL LOW (ref 13.0–17.0)
MCH: 26.3 pg (ref 26.0–34.0)
MCHC: 33.8 g/dL (ref 30.0–36.0)
MCV: 77.9 fL — ABNORMAL LOW (ref 80.0–100.0)
Platelets: 217 10*3/uL (ref 150–400)
RBC: 4.79 MIL/uL (ref 4.22–5.81)
RDW: 19.5 % — ABNORMAL HIGH (ref 11.5–15.5)
WBC: 11 10*3/uL — ABNORMAL HIGH (ref 4.0–10.5)
nRBC: 0 % (ref 0.0–0.2)

## 2020-01-07 LAB — URINE DRUG SCREEN, QUALITATIVE (ARMC ONLY)
Amphetamines, Ur Screen: NOT DETECTED
Barbiturates, Ur Screen: NOT DETECTED
Benzodiazepine, Ur Scrn: NOT DETECTED
Cannabinoid 50 Ng, Ur ~~LOC~~: NOT DETECTED
Cocaine Metabolite,Ur ~~LOC~~: NOT DETECTED
MDMA (Ecstasy)Ur Screen: NOT DETECTED
Methadone Scn, Ur: NOT DETECTED
Opiate, Ur Screen: NOT DETECTED
Phencyclidine (PCP) Ur S: NOT DETECTED
Tricyclic, Ur Screen: NOT DETECTED

## 2020-01-07 LAB — COMPREHENSIVE METABOLIC PANEL
ALT: 43 U/L (ref 0–44)
AST: 81 U/L — ABNORMAL HIGH (ref 15–41)
Albumin: 4.3 g/dL (ref 3.5–5.0)
Alkaline Phosphatase: 91 U/L (ref 38–126)
Anion gap: 15 (ref 5–15)
BUN: 8 mg/dL (ref 6–20)
CO2: 22 mmol/L (ref 22–32)
Calcium: 8.7 mg/dL — ABNORMAL LOW (ref 8.9–10.3)
Chloride: 102 mmol/L (ref 98–111)
Creatinine, Ser: 0.7 mg/dL (ref 0.61–1.24)
GFR calc Af Amer: >60 mL/min (ref >60)
GFR calc non Af Amer: >60 mL/min (ref >60)
Glucose, Bld: 148 mg/dL — ABNORMAL HIGH (ref 70–99)
Potassium: 3.7 mmol/L (ref 3.5–5.1)
Sodium: 139 mmol/L (ref 135–145)
Total Bilirubin: 1.4 mg/dL — ABNORMAL HIGH (ref 0.3–1.2)
Total Protein: 9.3 g/dL — ABNORMAL HIGH (ref 6.5–8.1)

## 2020-01-07 LAB — ETHANOL: Alcohol, Ethyl (B): 471 mg/dL (ref <10)

## 2020-01-07 NOTE — ED Notes (Signed)
Pt dressed in burgundy scrubs by this tech and [employer]. Pt items placed in belongings bags are following: Black sandal slides Blue jeans Black short sleeve shirt Brown belt Black underwear Tan wallet Black jacket

## 2020-01-07 NOTE — ED Triage Notes (Signed)
Pt presents to to ER from home with accompanied by [facility]. Pt reports he wants to kill himself, reports has SI denies HI reports he has visual and auditory hallucinations. Pt talks in complete sentences. Pt reports he drinks alcohol every day reports he drinks tequila every day.

## 2020-01-08 ENCOUNTER — Emergency Department
Admission: EM | Admit: 2020-01-08 | Discharge: 2020-01-08 | Disposition: A | Discharge status: Other Acute Inpatient Hospital | Payer: Medicare HMO | Source: Home / Self Care | Attending: Emergency Medicine | Admitting: Emergency Medicine

## 2020-01-08 ENCOUNTER — Emergency Department: Payer: Medicare HMO

## 2020-01-08 ENCOUNTER — Encounter: Payer: Self-pay | Admitting: Internal Medicine

## 2020-01-08 ENCOUNTER — Inpatient Hospital Stay
Admission: AD | Admit: 2020-01-08 | Discharge: 2020-01-10 | DRG: 885 | Disposition: A | Discharge status: Other Acute Inpatient Hospital | Payer: Medicare HMO | Source: Intra-hospital | Attending: Psychiatry | Admitting: Psychiatry

## 2020-01-08 ENCOUNTER — Other Ambulatory Visit: Payer: Self-pay

## 2020-01-08 DIAGNOSIS — F3130 Bipolar disorder, current episode depressed, mild or moderate severity, unspecified: Principal | ICD-10-CM | POA: Diagnosis present

## 2020-01-08 DIAGNOSIS — G629 Polyneuropathy, unspecified: Secondary | ICD-10-CM | POA: Diagnosis not present

## 2020-01-08 DIAGNOSIS — F3289 Other specified depressive episodes: Secondary | ICD-10-CM

## 2020-01-08 DIAGNOSIS — M79605 Pain in left leg: Secondary | ICD-10-CM | POA: Diagnosis present

## 2020-01-08 DIAGNOSIS — Z8673 Personal history of transient ischemic attack (TIA), and cerebral infarction without residual deficits: Secondary | ICD-10-CM

## 2020-01-08 DIAGNOSIS — F10239 Alcohol dependence with withdrawal, unspecified: Secondary | ICD-10-CM | POA: Diagnosis present

## 2020-01-08 DIAGNOSIS — Z720 Tobacco use: Secondary | ICD-10-CM | POA: Diagnosis present

## 2020-01-08 DIAGNOSIS — Z79890 Hormone replacement therapy (postmenopausal): Secondary | ICD-10-CM

## 2020-01-08 DIAGNOSIS — R45851 Suicidal ideations: Secondary | ICD-10-CM

## 2020-01-08 DIAGNOSIS — F4310 Post-traumatic stress disorder, unspecified: Secondary | ICD-10-CM | POA: Diagnosis present

## 2020-01-08 DIAGNOSIS — E039 Hypothyroidism, unspecified: Secondary | ICD-10-CM | POA: Diagnosis present

## 2020-01-08 DIAGNOSIS — F1010 Alcohol abuse, uncomplicated: Secondary | ICD-10-CM

## 2020-01-08 DIAGNOSIS — I9589 Other hypotension: Secondary | ICD-10-CM | POA: Diagnosis not present

## 2020-01-08 DIAGNOSIS — F10939 Alcohol use, unspecified with withdrawal, unspecified: Secondary | ICD-10-CM | POA: Diagnosis present

## 2020-01-08 DIAGNOSIS — I959 Hypotension, unspecified: Secondary | ICD-10-CM | POA: Diagnosis not present

## 2020-01-08 DIAGNOSIS — S0083XA Contusion of other part of head, initial encounter: Secondary | ICD-10-CM

## 2020-01-08 DIAGNOSIS — Z9114 Patient's other noncompliance with medication regimen: Secondary | ICD-10-CM | POA: Diagnosis not present

## 2020-01-08 DIAGNOSIS — F329 Major depressive disorder, single episode, unspecified: Secondary | ICD-10-CM | POA: Diagnosis not present

## 2020-01-08 DIAGNOSIS — G4700 Insomnia, unspecified: Secondary | ICD-10-CM | POA: Diagnosis not present

## 2020-01-08 DIAGNOSIS — G8929 Other chronic pain: Secondary | ICD-10-CM | POA: Diagnosis present

## 2020-01-08 DIAGNOSIS — F10929 Alcohol use, unspecified with intoxication, unspecified: Secondary | ICD-10-CM | POA: Diagnosis present

## 2020-01-08 DIAGNOSIS — K7030 Alcoholic cirrhosis of liver without ascites: Secondary | ICD-10-CM | POA: Diagnosis not present

## 2020-01-08 DIAGNOSIS — F209 Schizophrenia, unspecified: Secondary | ICD-10-CM | POA: Diagnosis not present

## 2020-01-08 DIAGNOSIS — F4489 Other dissociative and conversion disorders: Secondary | ICD-10-CM | POA: Diagnosis present

## 2020-01-08 DIAGNOSIS — F10231 Alcohol dependence with withdrawal delirium: Secondary | ICD-10-CM | POA: Diagnosis not present

## 2020-01-08 DIAGNOSIS — F1914 Other psychoactive substance abuse with psychoactive substance-induced mood disorder: Secondary | ICD-10-CM | POA: Diagnosis not present

## 2020-01-08 DIAGNOSIS — F10229 Alcohol dependence with intoxication, unspecified: Secondary | ICD-10-CM | POA: Diagnosis present

## 2020-01-08 DIAGNOSIS — R109 Unspecified abdominal pain: Secondary | ICD-10-CM | POA: Diagnosis present

## 2020-01-08 DIAGNOSIS — Z79899 Other long term (current) drug therapy: Secondary | ICD-10-CM

## 2020-01-08 DIAGNOSIS — F4312 Post-traumatic stress disorder, chronic: Secondary | ICD-10-CM | POA: Diagnosis present

## 2020-01-08 DIAGNOSIS — I10 Essential (primary) hypertension: Secondary | ICD-10-CM | POA: Diagnosis present

## 2020-01-08 DIAGNOSIS — S01511A Laceration without foreign body of lip, initial encounter: Secondary | ICD-10-CM

## 2020-01-08 DIAGNOSIS — F1024 Alcohol dependence with alcohol-induced mood disorder: Secondary | ICD-10-CM | POA: Diagnosis not present

## 2020-01-08 DIAGNOSIS — E861 Hypovolemia: Secondary | ICD-10-CM | POA: Diagnosis not present

## 2020-01-08 DIAGNOSIS — R519 Headache, unspecified: Secondary | ICD-10-CM | POA: Diagnosis not present

## 2020-01-08 DIAGNOSIS — R441 Visual hallucinations: Secondary | ICD-10-CM | POA: Diagnosis not present

## 2020-01-08 DIAGNOSIS — Z20822 Contact with and (suspected) exposure to covid-19: Secondary | ICD-10-CM | POA: Diagnosis not present

## 2020-01-08 DIAGNOSIS — F1994 Other psychoactive substance use, unspecified with psychoactive substance-induced mood disorder: Secondary | ICD-10-CM

## 2020-01-08 DIAGNOSIS — K5792 Diverticulitis of intestine, part unspecified, without perforation or abscess without bleeding: Secondary | ICD-10-CM | POA: Diagnosis present

## 2020-01-08 DIAGNOSIS — F10230 Alcohol dependence with withdrawal, uncomplicated: Secondary | ICD-10-CM | POA: Diagnosis not present

## 2020-01-08 DIAGNOSIS — E538 Deficiency of other specified B group vitamins: Secondary | ICD-10-CM | POA: Diagnosis present

## 2020-01-08 DIAGNOSIS — F10951 Alcohol use, unspecified with alcohol-induced psychotic disorder with hallucinations: Secondary | ICD-10-CM | POA: Diagnosis not present

## 2020-01-08 DIAGNOSIS — D696 Thrombocytopenia, unspecified: Secondary | ICD-10-CM | POA: Diagnosis not present

## 2020-01-08 DIAGNOSIS — F10931 Alcohol use, unspecified with withdrawal delirium: Secondary | ICD-10-CM | POA: Diagnosis present

## 2020-01-08 DIAGNOSIS — F1020 Alcohol dependence, uncomplicated: Secondary | ICD-10-CM | POA: Diagnosis present

## 2020-01-08 DIAGNOSIS — F333 Major depressive disorder, recurrent, severe with psychotic symptoms: Secondary | ICD-10-CM | POA: Diagnosis present

## 2020-01-08 DIAGNOSIS — F419 Anxiety disorder, unspecified: Secondary | ICD-10-CM | POA: Diagnosis not present

## 2020-01-08 DIAGNOSIS — R220 Localized swelling, mass and lump, head: Secondary | ICD-10-CM | POA: Diagnosis not present

## 2020-01-08 DIAGNOSIS — M5137 Other intervertebral disc degeneration, lumbosacral region: Secondary | ICD-10-CM | POA: Diagnosis present

## 2020-01-08 DIAGNOSIS — R4589 Other symptoms and signs involving emotional state: Secondary | ICD-10-CM | POA: Diagnosis present

## 2020-01-08 LAB — SARS CORONAVIRUS 2 BY RT PCR (HOSPITAL ORDER, PERFORMED IN ~~LOC~~ HOSPITAL LAB): SARS Coronavirus 2: NEGATIVE

## 2020-01-08 MED ORDER — PANTOPRAZOLE SODIUM 40 MG PO TBEC
40.0000 mg | DELAYED_RELEASE_TABLET | Freq: Two times a day (BID) | ORAL | Status: DC
  Administered 2020-01-08: 40 mg via ORAL
  Filled 2020-01-08: qty 1

## 2020-01-08 MED ORDER — LORAZEPAM 2 MG/ML IJ SOLN: 0.0000 mg | Freq: Four times a day (QID) | INTRAMUSCULAR | Status: DC

## 2020-01-08 MED ORDER — ALUM & MAG HYDROXIDE-SIMETH 200-200-20 MG/5ML PO SUSP: 30.0000 mL | Freq: Four times a day (QID) | ORAL | Status: DC | PRN

## 2020-01-08 MED ORDER — TRAZODONE HCL 100 MG PO TABS: 100.0000 mg | ORAL_TABLET | Freq: Every evening | ORAL | Status: DC | PRN

## 2020-01-08 MED ORDER — IBUPROFEN 600 MG PO TABS: 600.0000 mg | ORAL_TABLET | Freq: Three times a day (TID) | ORAL | Status: DC | PRN

## 2020-01-08 MED ORDER — THIAMINE HCL 100 MG PO TABS
100.0000 mg | ORAL_TABLET | Freq: Every day | ORAL | Status: DC
  Administered 2020-01-08: 100 mg via ORAL
  Filled 2020-01-08: qty 1

## 2020-01-08 MED ORDER — TRAZODONE HCL 100 MG PO TABS
100.0000 mg | ORAL_TABLET | Freq: Every evening | ORAL | Status: DC | PRN
  Administered 2020-01-08 – 2020-01-09 (×2): 100 mg via ORAL
  Filled 2020-01-08 (×2): qty 1

## 2020-01-08 MED ORDER — ONDANSETRON HCL 4 MG PO TABS: 4.0000 mg | ORAL_TABLET | Freq: Three times a day (TID) | ORAL | Status: DC | PRN

## 2020-01-08 MED ORDER — LORAZEPAM 2 MG PO TABS
0.0000 mg | ORAL_TABLET | Freq: Four times a day (QID) | ORAL | Status: DC
  Administered 2020-01-08 (×2): 2 mg via ORAL
  Filled 2020-01-08 (×2): qty 1

## 2020-01-08 MED ORDER — LORAZEPAM 2 MG/ML IJ SOLN: 0.0000 mg | Freq: Two times a day (BID) | INTRAMUSCULAR | Status: DC

## 2020-01-08 MED ORDER — LORAZEPAM 2 MG PO TABS: 0.0000 mg | ORAL_TABLET | Freq: Two times a day (BID) | ORAL | Status: DC

## 2020-01-08 MED ORDER — GABAPENTIN 300 MG PO CAPS: 600.0000 mg | ORAL_CAPSULE | Freq: Three times a day (TID) | ORAL | Status: DC

## 2020-01-08 MED ORDER — HYDROXYZINE HCL 10 MG PO TABS: 10.0000 mg | ORAL_TABLET | Freq: Three times a day (TID) | ORAL | Status: DC | PRN

## 2020-01-08 MED ORDER — FLUOXETINE HCL 20 MG PO CAPS
40.0000 mg | ORAL_CAPSULE | Freq: Every day | ORAL | Status: DC
  Administered 2020-01-08: 40 mg via ORAL
  Filled 2020-01-08: qty 2

## 2020-01-08 MED ORDER — HYDROXYZINE HCL 25 MG PO TABS: 25.0000 mg | ORAL_TABLET | Freq: Three times a day (TID) | ORAL | Status: DC | PRN

## 2020-01-08 MED ORDER — FOLIC ACID 1 MG PO TABS
1.0000 mg | ORAL_TABLET | Freq: Every day | ORAL | Status: DC
  Administered 2020-01-08: 1 mg via ORAL
  Filled 2020-01-08: qty 1

## 2020-01-08 MED ORDER — ACETAMINOPHEN 325 MG PO TABS: 650.0000 mg | ORAL_TABLET | Freq: Four times a day (QID) | ORAL | Status: DC | PRN

## 2020-01-08 MED ORDER — LEVOTHYROXINE SODIUM 125 MCG PO TABS
125.0000 ug | ORAL_TABLET | Freq: Every day | ORAL | Status: DC
  Filled 2020-01-08: qty 1

## 2020-01-08 MED ORDER — BUSPIRONE HCL 10 MG PO TABS
15.0000 mg | ORAL_TABLET | Freq: Two times a day (BID) | ORAL | Status: DC
  Administered 2020-01-08: 15 mg via ORAL
  Filled 2020-01-08: qty 3

## 2020-01-08 MED ORDER — LORAZEPAM 2 MG PO TABS
2.0000 mg | ORAL_TABLET | Freq: Once | ORAL | Status: AC
  Administered 2020-01-08: 2 mg via ORAL
  Filled 2020-01-08: qty 1

## 2020-01-08 MED ORDER — BENZTROPINE MESYLATE 1 MG PO TABS
0.5000 mg | ORAL_TABLET | Freq: Two times a day (BID) | ORAL | Status: DC
  Administered 2020-01-08: 0.5 mg via ORAL
  Filled 2020-01-08: qty 1

## 2020-01-08 MED ORDER — DULOXETINE HCL 20 MG PO CPEP
20.0000 mg | ORAL_CAPSULE | Freq: Every day | ORAL | Status: DC
  Administered 2020-01-08: 20 mg via ORAL
  Filled 2020-01-08: qty 1

## 2020-01-08 MED ORDER — THIAMINE HCL 100 MG/ML IJ SOLN: 100.0000 mg | Freq: Every day | INTRAMUSCULAR | Status: DC

## 2020-01-08 MED ORDER — CLONAZEPAM 1 MG PO TABS
1.0000 mg | ORAL_TABLET | Freq: Two times a day (BID) | ORAL | Status: DC
  Filled 2020-01-08: qty 1

## 2020-01-08 MED ORDER — ALUM & MAG HYDROXIDE-SIMETH 200-200-20 MG/5ML PO SUSP: 30.0000 mL | ORAL | Status: DC | PRN

## 2020-01-08 MED ORDER — LORAZEPAM 2 MG PO TABS
2.0000 mg | ORAL_TABLET | Freq: Four times a day (QID) | ORAL | Status: DC | PRN
  Administered 2020-01-08 – 2020-01-09 (×2): 2 mg via ORAL
  Filled 2020-01-08 (×2): qty 1

## 2020-01-08 MED ORDER — HALOPERIDOL 0.5 MG PO TABS
2.0000 mg | ORAL_TABLET | Freq: Two times a day (BID) | ORAL | Status: DC
  Administered 2020-01-08: 2 mg via ORAL
  Filled 2020-01-08 (×2): qty 1

## 2020-01-08 MED ORDER — MAGNESIUM HYDROXIDE 400 MG/5ML PO SUSP: 30.0000 mL | Freq: Every day | ORAL | Status: DC | PRN

## 2020-01-08 NOTE — ED Provider Notes (Signed)
[facility] Emergency Department Provider Note  ____________________________________________   First MD Initiated Contact with Patient 01/08/20 0155     (approximate)  I have reviewed the triage vital signs and the nursing notes.   HISTORY  Chief Complaint Psychiatric Evaluation  Level 5 caveat: The patient's history is limited by acute alcohol intoxication/alcohol abuse as well as chronic mental illness.  HPI Ivan Hamilton is a 54 y.o. male with a history of alcohol abuse including prior episodes of alcohol withdrawal and a history of auditory hallucinations and visual hallucinations.  He presents under involuntary commitment by law enforcement due to suicidal ideation.  He admits to daily alcohol abuse and says that he last drank earlier today/yesterday (is currently overnight).  He says that he was in the police or [place] in [place] and his entire unit was killed in front of him.  He has a great deal of guilt as a result and sometimes his dad soldiers visit him.  He says he knows it is not real but it is still very upsetting.  As result he frequently wants to die.  He had a fall tonight where he says he "tried to kill himself" by smashing his face on his porch.  He has some pain in his upper lip particularly on the right side.  He also complains of pain all over his entire body including some back pain rating down his left leg and has been told in the past that he has sciatica.  He has no other acute pain except for the pain in his face.  He is also complaining of some pain in his neck but it is difficult to appreciate if this is acute pain.         Past Medical History:  Diagnosis Date  . Alcohol withdrawal ([place])   . B12 deficiency   . Chronic pain   . CVA (cerebral vascular accident) ([place])   . Hypertension   . Hypothyroidism   . Varicose veins     Patient Active Problem List   Diagnosis Date Noted  . Metabolic acidosis   . Hepatic  cirrhosis ([place])   . Diverticulitis   . Transaminitis   . Syncope 07/11/2019  . Abdominal pain 07/11/2019  . Alcohol abuse 07/11/2019  . Thrombocytopenia ([place]) 07/11/2019  . Normocytic anemia 07/11/2019  . Tobacco abuse 07/11/2019  . HTN (hypertension) 07/11/2019  . CVA (cerebral vascular accident) ([place])   . Suicidal behavior 08/28/2018  . Severe recurrent major depressive disorder with psychotic features ([place]) 08/28/2018  . Alcohol intoxication ([place]) 08/28/2018  . Suicidal ideation 08/28/2018  . Duodenitis   . Gastritis without bleeding   . Hypokalemia 10/22/2017  . Intractable vomiting with nausea   . Depression 04/10/2016  . Alcohol withdrawal delirium ([place])   . Alcohol withdrawal ([place]) 09/02/2015  . Alcohol use disorder, severe, dependence ([place]) 09/02/2015  . 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 PTSD (post-traumatic stress disorder) 09/01/2015  . Hypothyroid 09/01/2015  . Gastric reflux 09/01/2015  . DDD (degenerative disc disease), lumbosacral 09/01/2015  . 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 Vitamin B12 deficiency 08/17/2015  . Pain of lumbar spine 05/19/2015  . 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 Varicose vein of leg 04/25/2015  . Abnormal LFTs (liver function tests) 03/31/2015  . Confusion state 03/31/2015  . Chronic pain of left lower extremity 03/31/2015    Past Surgical  History:  Procedure Laterality Date  . CHOLECYSTECTOMY    . ESOPHAGOGASTRODUODENOSCOPY (EGD) WITH PROPOFOL N/A 10/24/2017   Procedure: ESOPHAGOGASTRODUODENOSCOPY (EGD) WITH PROPOFOL;  Surgeon: [name], MD;  Location: [facility] ENDOSCOPY;  Service: Endoscopy;  Laterality: N/A;  . ESOPHAGOGASTRODUODENOSCOPY (EGD) WITH PROPOFOL N/A 07/12/2019   Procedure: ESOPHAGOGASTRODUODENOSCOPY (EGD) WITH PROPOFOL;  Surgeon: Jonathon Bellows, MD;  Location: [facility] ENDOSCOPY;  Service: Gastroenterology;   Laterality: N/A;  . LEG SURGERY      Prior to Admission medications   Medication Sig Start Date End Date Taking? Authorizing Provider  busPIRone (BUSPAR) 15 MG tablet Take 1 tablet (15 mg total) by mouth 2 (two) times daily. 09/24/18   Lavella Hammock, MD  FLUoxetine (PROZAC) 40 MG capsule Take 1 capsule (40 mg total) by mouth daily. 09/24/18   Lavella Hammock, MD  folic acid (FOLVITE) 1 MG tablet Take 1 tablet (1 mg total) by mouth daily. 07/17/19   Jennye Boroughs, MD  gabapentin (NEURONTIN) 300 MG capsule Take 2 capsules (600 mg total) by mouth 3 (three) times daily. 09/24/18   Lavella Hammock, MD  hydrOXYzine (ATARAX/VISTARIL) 25 MG tablet Take 25 mg by mouth every 8 (eight) hours as needed for anxiety. 01/28/19   [provider]  levothyroxine (SYNTHROID, LEVOTHROID) 125 MCG tablet Take 1 tablet (125 mcg total) by mouth daily before breakfast. 09/24/18   Lavella Hammock, MD  pantoprazole (PROTONIX) 40 MG tablet Take 40 mg by mouth 2 (two) times daily. 01/28/19   [provider]  traZODone (DESYREL) 50 MG tablet Take 100 mg by mouth at bedtime as needed for sleep. 06/01/19   [provider]    Allergies Patient has no known allergies.  No family history on file.  Social History Social History   Tobacco Use  . Smoking status: Never Smoker  . Smokeless tobacco: Never Used  Vaping Use  . Vaping Use: Never used  Substance Use Topics  . Alcohol use: Yes    Comment: daily- 1 pint of vodka  . Drug use: No    Review of Systems Level 5 caveat: The patient's history is limited by acute alcohol intoxication/alcohol abuse as well as chronic mental illness.  Constitutional: No fever/chills Eyes: No visual changes. ENT: No sore throat. Cardiovascular: Denies chest pain. Respiratory: Denies shortness of breath. Gastrointestinal: No abdominal pain.  No nausea, no vomiting.  No diarrhea.  No constipation. Genitourinary: Negative for dysuria. Musculoskeletal: Facial  pain particular area of the right side of his upper lip.  Negative for neck pain.  Negative for back pain. Integumentary: Negative for rash. Neurological: Negative for headaches, focal weakness or numbness. Psych: Alcohol intoxication, suicidal ideation, auditory visual hallucinations.  ____________________________________________   PHYSICAL EXAM:  VITAL SIGNS: ED Triage Vitals  Enc Vitals Group     BP 01/07/20 2031 (!) 115/94     Pulse Rate 01/07/20 2031 (!) 112     Resp 01/07/20 2031 20     Temp 01/07/20 2031 98.4 F (36.9 C)     Temp Source 01/07/20 2031 Oral     SpO2 01/07/20 2031 95 %     Weight 01/07/20 2033 63.5 kg (140 lb)     Height 01/07/20 2033 1.651 m (5\' 5" )     Head Circumference --      Peak Flow --      Pain Score 01/07/20 2032 7     Pain Loc --      Pain Edu? --      Excl.  in [place]? --     Constitutional: Alert and oriented although he does appear clinically intoxicated as well. Eyes: Conjunctivae are normal.  Head: Dried epistaxis bilaterally.  Some swelling of the bridge of the nose.  Significant swelling of the entire upper lip with a laceration within the vermilion part of the middle of the upper lip but not requiring sutures. Nose: No congestion/rhinnorhea. Mouth/Throat: Upper lip swelling secondary to contusion as described above.  No unstable teeth nor dental pain. Neck: No stridor.  No meningeal signs.  No tenderness to palpation of the cervical spine and the patient is moving his head all around without any pain or tenderness. Cardiovascular: Normal rate, regular rhythm. Good peripheral circulation. Grossly normal heart sounds. Respiratory: Normal respiratory effort.  No retractions. Gastrointestinal: Soft and nontender. No distention.  Musculoskeletal: No lower extremity tenderness nor edema. No gross deformities of extremities. Neurologic: Pressured speech and language. No gross focal neurologic deficits are appreciated.  Skin:  Skin is warm, dry and  intact. Psychiatric: Mood and affect are depressed and somewhat labile.  Reports suicidal ideation and the same chronic auditory visual hallucinations that he has had, according to him, since the mid 1980s.  ____________________________________________   LABS (all labs ordered are listed, but only abnormal results are displayed)  Labs Reviewed  COMPREHENSIVE METABOLIC PANEL - Abnormal; Notable for the following components:      Result Value   Glucose, Bld 148 (*)    Calcium 8.7 (*)    Total Protein 9.3 (*)    AST 81 (*)    Total Bilirubin 1.4 (*)    All other components within normal limits  ETHANOL - Abnormal; Notable for the following components:   Alcohol, Ethyl (B) 471 (*)    All other components within normal limits  CBC - Abnormal; Notable for the following components:   WBC 11.0 (*)    Hemoglobin 12.6 (*)    HCT 37.3 (*)    MCV 77.9 (*)    RDW 19.5 (*)    All other components within normal limits  SARS CORONAVIRUS 2 BY RT PCR (HOSPITAL ORDER, [facility])  URINE DRUG SCREEN, QUALITATIVE ([facility] ONLY)   ____________________________________________  EKG  No indication for emergent EKG ____________________________________________  RADIOLOGY I, Hinda Kehr, personally viewed and evaluated these images (plain radiographs) as part of my medical decision making, as well as reviewing the written report by the radiologist.  ED MD interpretation:  No acute abnormalities identified on CTs of face, head, and neck.  Official radiology report(s): CT Head Wo Contrast  Result Date: 01/08/2020 CLINICAL DATA:  Suicidal ideation, visual and auditory hallucinations EXAM: CT CERVICAL SPINE WITHOUT CONTRAST TECHNIQUE: Multidetector CT imaging of the cervical spine was performed without intravenous contrast. Multiplanar CT image reconstructions were also generated. COMPARISON:  September 07, 2019. FINDINGS: Brain: No evidence of acute territorial infarction,  hemorrhage, hydrocephalus,extra-axial collection or mass lesion/mass effect. There is mild dilatation the ventricles and sulci consistent with age-related atrophy. Low-attenuation changes in the deep white matter consistent with small vessel ischemia. Vascular: No hyperdense vessel or unexpected calcification. Skull: The skull is intact. No fracture or focal lesion identified. Sinuses/Orbits: The visualized paranasal sinuses and mastoid air cells are clear. The orbits and globes intact. Other: None Face: Osseous: No acute fracture or other significant osseous abnormality.The nasal bone, mandibles, zygomatic arches and pterygoid plates are intact. Orbits: No fracture identified. Unremarkable appearance of globes and orbits. Sinuses: The visualized paranasal sinuses and mastoid air  cells are unremarkable. Soft tissues:  No acute findings. Limited intracranial: No acute findings. Cervical spine: Alignment: Physiologic Skull base and vertebrae: Visualized skull base is intact. No atlanto-occipital dissociation. The vertebral body heights are well maintained. No fracture or pathologic osseous lesion seen. Soft tissues and spinal canal: The visualized paraspinal soft tissues are unremarkable. No prevertebral soft tissue swelling is seen. The spinal canal is grossly unremarkable, no large epidural collection or significant canal narrowing. Disc levels:  No significant canal or neural foraminal narrowing. Upper chest: The lung apices are clear. Thoracic inlet is within normal limits. Other: None IMPRESSION: No acute intracranial abnormality. Findings consistent with mild age related atrophy and chronic small vessel ischemia No acute facial fracture No acute fracture or malalignment of the spine. Electronically Signed   By: Prudencio Pair M.D.   On: 01/08/2020 03:04   CT Cervical Spine Wo Contrast  Result Date: 01/08/2020 CLINICAL DATA:  Suicidal ideation, visual and auditory hallucinations EXAM: CT CERVICAL SPINE WITHOUT  CONTRAST TECHNIQUE: Multidetector CT imaging of the cervical spine was performed without intravenous contrast. Multiplanar CT image reconstructions were also generated. COMPARISON:  September 07, 2019. FINDINGS: Brain: No evidence of acute territorial infarction, hemorrhage, hydrocephalus,extra-axial collection or mass lesion/mass effect. There is mild dilatation the ventricles and sulci consistent with age-related atrophy. Low-attenuation changes in the deep white matter consistent with small vessel ischemia. Vascular: No hyperdense vessel or unexpected calcification. Skull: The skull is intact. No fracture or focal lesion identified. Sinuses/Orbits: The visualized paranasal sinuses and mastoid air cells are clear. The orbits and globes intact. Other: None Face: Osseous: No acute fracture or other significant osseous abnormality.The nasal bone, mandibles, zygomatic arches and pterygoid plates are intact. Orbits: No fracture identified. Unremarkable appearance of globes and orbits. Sinuses: The visualized paranasal sinuses and mastoid air cells are unremarkable. Soft tissues:  No acute findings. Limited intracranial: No acute findings. Cervical spine: Alignment: Physiologic Skull base and vertebrae: Visualized skull base is intact. No atlanto-occipital dissociation. The vertebral body heights are well maintained. No fracture or pathologic osseous lesion seen. Soft tissues and spinal canal: The visualized paraspinal soft tissues are unremarkable. No prevertebral soft tissue swelling is seen. The spinal canal is grossly unremarkable, no large epidural collection or significant canal narrowing. Disc levels:  No significant canal or neural foraminal narrowing. Upper chest: The lung apices are clear. Thoracic inlet is within normal limits. Other: None IMPRESSION: No acute intracranial abnormality. Findings consistent with mild age related atrophy and chronic small vessel ischemia No acute facial fracture No acute fracture  or malalignment of the spine. Electronically Signed   By: Prudencio Pair M.D.   On: 01/08/2020 03:04   CT Maxillofacial Wo Contrast  Result Date: 01/08/2020 CLINICAL DATA:  Suicidal ideation, visual and auditory hallucinations EXAM: CT CERVICAL SPINE WITHOUT CONTRAST TECHNIQUE: Multidetector CT imaging of the cervical spine was performed without intravenous contrast. Multiplanar CT image reconstructions were also generated. COMPARISON:  September 07, 2019. FINDINGS: Brain: No evidence of acute territorial infarction, hemorrhage, hydrocephalus,extra-axial collection or mass lesion/mass effect. There is mild dilatation the ventricles and sulci consistent with age-related atrophy. Low-attenuation changes in the deep white matter consistent with small vessel ischemia. Vascular: No hyperdense vessel or unexpected calcification. Skull: The skull is intact. No fracture or focal lesion identified. Sinuses/Orbits: The visualized paranasal sinuses and mastoid air cells are clear. The orbits and globes intact. Other: None Face: Osseous: No acute fracture or other significant osseous abnormality.The nasal bone, mandibles, zygomatic arches and pterygoid plates are  intact. Orbits: No fracture identified. Unremarkable appearance of globes and orbits. Sinuses: The visualized paranasal sinuses and mastoid air cells are unremarkable. Soft tissues:  No acute findings. Limited intracranial: No acute findings. Cervical spine: Alignment: Physiologic Skull base and vertebrae: Visualized skull base is intact. No atlanto-occipital dissociation. The vertebral body heights are well maintained. No fracture or pathologic osseous lesion seen. Soft tissues and spinal canal: The visualized paraspinal soft tissues are unremarkable. No prevertebral soft tissue swelling is seen. The spinal canal is grossly unremarkable, no large epidural collection or significant canal narrowing. Disc levels:  No significant canal or neural foraminal narrowing.  Upper chest: The lung apices are clear. Thoracic inlet is within normal limits. Other: None IMPRESSION: No acute intracranial abnormality. Findings consistent with mild age related atrophy and chronic small vessel ischemia No acute facial fracture No acute fracture or malalignment of the spine. Electronically Signed   By: Prudencio Pair M.D.   On: 01/08/2020 03:04    ____________________________________________   PROCEDURES   Procedure(s) performed (including Critical Care):  Procedures   ____________________________________________   INITIAL IMPRESSION / MDM / [place] / ED COURSE  As part of my medical decision making, I reviewed the following data within the [place] notes reviewed and incorporated, Labs reviewed , Old chart reviewed, A consult was requested and obtained from this/these consultant(s) Psychiatry, Notes from prior ED visits and [place] Controlled Substance Database   Differential diagnosis includes, but is not limited to, substance-induced mood disorder, alcohol intoxication, PTSD, adjustment disorder, depression.  The patient is well-known to the emergency department and psychiatric service.  I am upholding the involuntary commitment at this time given his degree of intoxication but his symptoms seem chronic and exacerbated by alcohol.  He was seen by Kennyth Lose with the psychiatry service who recommends psychiatric reassessment in the morning.  He is having some degree of alcohol withdrawal and spite of an ethanol level of about 471 but this is to be expected given his history of heavy alcohol abuse.  He is on CIWA and I ordered Ativan 2 mg by mouth after I saw him.  I also ordered thiamine 100 mg p.o.    The patient does have some facial trauma and CT scans were obtained.  No indication of emergent abnormality on head, face, and cervical spine CTs.  The patient is awake and oriented but does appear clinically intoxicated.  I explained to him  that his lip does not require sutures but will need to heal over time and recommended soft foods and liquids.  He has been evaluated by psychiatry who recommended reassessment by psychiatry in the morning.  In the meantime we are upholding the involuntary commitment order.  Lab work is generally reassuring other than the ethanol level of 471.  He has an essentially normal comprehensive metabolic panel and CBC as well as a negative urine drug screen.  The patient has been placed in psychiatric observation due to the need to provide a safe environment for the patient while obtaining psychiatric consultation and evaluation, as well as ongoing medical and medication management to treat the patient's condition.  The patient has been placed under full IVC at this time.             ____________________________________________  FINAL CLINICAL IMPRESSION(S) / ED DIAGNOSES  Final diagnoses:  Other depression  PTSD (post-traumatic stress disorder)  Substance induced mood disorder (HCC)  Alcohol abuse  Facial contusion, initial encounter  Lip laceration, initial  encounter     MEDICATIONS GIVEN DURING THIS VISIT:  Medications  LORazepam (ATIVAN) tablet 2 mg (2 mg Oral Given 01/08/20 0254)     ED Discharge Orders    None      *Please note:  Shakur Lembo was evaluated in Emergency Department on 01/08/2020 for the symptoms described in the history of present illness. He was evaluated in the context of the global COVID-19 pandemic, which necessitated consideration that the patient might be at risk for infection with the SARS-CoV-2 virus that causes COVID-19. Institutional protocols and algorithms that pertain to the evaluation of patients at risk for COVID-19 are in a state of rapid change based on information released by regulatory bodies including the CDC and federal and state organizations. These policies and algorithms were followed during the patient's care in the ED.  Some ED  evaluations and interventions may be delayed as a result of limited staffing during the pandemic.*  Note:  This document was prepared using Dragon voice recognition software and may include unintentional dictation errors.   Hinda Kehr, MD 01/08/20 0425

## 2020-01-08 NOTE — ED Notes (Signed)
Patient took oral medications easily. Patient's top lip remains swollen, dried blood noted on mustache. Patient has abrasions on nose and forehead.

## 2020-01-08 NOTE — Plan of Care (Signed)
Patient new to the unit tonight  Problem: Education: Goal: Knowledge of [place] [facility] Education information/materials will improve Outcome: Not Progressing Goal: Emotional status will improve Outcome: Not Progressing Goal: Mental status will improve Outcome: Not Progressing Goal: Verbalization of understanding the information provided will improve Outcome: Not Progressing   Problem: Safety: Goal: Periods of time without injury will increase Outcome: Not Progressing   Problem: Education: Goal: Utilization of techniques to improve thought processes will improve Outcome: Not Progressing Goal: Knowledge of the prescribed therapeutic regimen will improve Outcome: Not Progressing   Problem: Safety: Goal: Ability to disclose and discuss suicidal ideas will improve Outcome: Not Progressing Goal: Ability to identify and utilize support systems that promote safety will improve Outcome: Not Progressing

## 2020-01-08 NOTE — Progress Notes (Signed)
Patient was not yet admitted during nightly group therapy but did receive a cup of ice cold water before resting comfortably in his room. No issues to report at this time.

## 2020-01-08 NOTE — BH Assessment (Signed)
Upon interview, pt. was alert and oriented X4. Pt was calm and cooperative throughout the reassessment. Pt. demonstrated good insight concerning his substance and mental health issues. Pt continues to endorse SI and AV/H. Pt denied HI. Pt expressed interest in pursuing treatment for his alcohol use.

## 2020-01-08 NOTE — BH Assessment (Addendum)
Assessment Note  Ivan Hamilton is an 54 y.o. male presenting to [facility] ED under IVC. Per triage note Pt presents to to ER from home with accompanied by [facility]. Pt reports he wants to kill himself, reports has SI denies HI reports he has visual and auditory hallucinations. Pt talks in complete sentences. Pt reports he drinks alcohol every day reports he drinks tequila every day. During assessment patient is visibly intoxicated, has flight of ideas, is incoherent and is not completley oriented. Patient reports "I attempted suicide yesterday" patient was unable to report what he had done or how he had attempted "the police come to the house and brought me here, I been drinking all day every day and I was drinking that morning I tried to kill myself." Patient has visible marks on his face and a busted front lip he reports "I fell down and hit my face, I'm crazy." Patient reports AH and VH "when I don't drink I see people on my porch and I hear my wife." Patient's BAL is 471. Patient unable to report if he is currently SI or HI.  Per Psyc NP patient will be observed overnight and reassessed in the morning  Diagnosis: Alcohol Use Disorder Severe, Substance Induced Mood Disorder  Past Medical History:  Past Medical History:  Diagnosis Date  . Alcohol withdrawal ([place])   . B12 deficiency   . Chronic pain   . CVA (cerebral vascular accident) ([place])   . Hypertension   . Hypothyroidism   . Varicose veins     Past Surgical History:  Procedure Laterality Date  . CHOLECYSTECTOMY    . ESOPHAGOGASTRODUODENOSCOPY (EGD) WITH PROPOFOL N/A 10/24/2017   Procedure: ESOPHAGOGASTRODUODENOSCOPY (EGD) WITH PROPOFOL;  Surgeon: [name], MD;  Location: [facility] ENDOSCOPY;  Service: Endoscopy;  Laterality: N/A;  . ESOPHAGOGASTRODUODENOSCOPY (EGD) WITH PROPOFOL N/A 07/12/2019   Procedure: ESOPHAGOGASTRODUODENOSCOPY (EGD) WITH PROPOFOL;  Surgeon: Jonathon Bellows, MD;  Location: [facility] ENDOSCOPY;  Service:  Gastroenterology;  Laterality: N/A;  . LEG SURGERY      Family History: No family history on file.  Social History:  reports that he has never smoked. He has never used smokeless tobacco. He reports current alcohol use. He reports that he does not use drugs.  Additional Social History:  Alcohol / Drug Use Pain Medications: See MAR Prescriptions: See MAR Over the Counter: See MAR History of alcohol / drug use?: Yes Substance #1 Name of Substance 1: Alcohol  CIWA: CIWA-Ar BP: 125/86 Pulse Rate: 89 Nausea and Vomiting: no nausea and no vomiting Tactile Disturbances: none Tremor: moderate, with patient's arms extended Auditory Disturbances: not present Paroxysmal Sweats: no sweat visible Visual Disturbances: not present Anxiety: three Headache, Fullness in Head: moderately severe Agitation: somewhat more than normal activity Orientation and Clouding of Sensorium: cannot do serial additions or is uncertain about date CIWA-Ar Total: 13 COWS:    Allergies: No Known Allergies  Home Medications: (Not in a hospital admission)   OB/GYN Status:  No LMP for male patient.  General Assessment Data Location of Assessment: [facility] ED TTS Assessment: In system Is this a Tele or Face-to-Face Assessment?: Face-to-Face Is this an Initial Assessment or a Re-assessment for this encounter?: Initial Assessment Patient Accompanied by:: N/A Language Other than English: Yes What is your preferred language: Spanish Living Arrangements: Other (Comment) What gender do you identify as?: Male Marital status: Married Living Arrangements: Spouse/significant other Can pt return to current living arrangement?: Yes Admission Status: Involuntary Petitioner: Police Is patient capable of signing  voluntary admission?: No Referral Source: Other Insurance type: [employer] Medicare  Medical Screening Exam ([place]) Medical Exam completed: Yes  Crisis Care Plan Living Arrangements:  Spouse/significant other Legal Guardian: Other: (Self) Name of Psychiatrist: None Name of Therapist: None  Education Status Is patient currently in school?: No Is the patient employed, unemployed or receiving disability?: Receiving disability income  Risk to self with the past 6 months Suicidal Ideation: Yes-Currently Present Has patient been a risk to self within the past 6 months prior to admission? : Yes Suicidal Intent: Yes-Currently Present Has patient had any suicidal intent within the past 6 months prior to admission? : Yes Is patient at risk for suicide?: Yes Suicidal Plan?: No Has patient had any suicidal plan within the past 6 months prior to admission? : Yes Specify Current Suicidal Plan: Patient reported that he attempted "yesterday" but did not report a plan Access to Means:  (Unknown) What has been your use of drugs/alcohol within the last 12 months?: Alcohol Abuse Previous Attempts/Gestures: Yes How many times?:  (Unknown) Other Self Harm Risks: Alcohol Abuse Triggers for Past Attempts: None known Intentional Self Injurious Behavior:  (Unknown) Family Suicide History: Unknown Recent stressful life event(s): Other (Comment), Trauma (Comment), Financial Problems (PTSD) Persecutory voices/beliefs?: No Depression: Yes Depression Symptoms: Isolating, Tearfulness, Feeling worthless/self pity Substance abuse history and/or treatment for substance abuse?: Yes Suicide prevention information given to non-admitted patients: Not applicable  Risk to Others within the past 6 months Homicidal Ideation: No Does patient have any lifetime risk of violence toward others beyond the six months prior to admission? : No Thoughts of Harm to Others: No Current Homicidal Intent: No Current Homicidal Plan: No Access to Homicidal Means: No Identified Victim: None History of harm to others?: No Assessment of Violence: None Noted Violent Behavior Description: None Does patient have  access to weapons?:  (Unknown) Criminal Charges Pending?:  (Unknown) Does patient have a court date:  (Unknown) Is patient on probation?: No  Psychosis Hallucinations: None noted Delusions: None noted  Mental Status Report Appearance/Hygiene: Disheveled Eye Contact: Good Motor Activity: Freedom of movement Speech: Pressured, Slurred Level of Consciousness: Alert Mood: Anxious, Labile Affect: Labile Anxiety Level: Moderate Thought Processes: Flight of Ideas Judgement: Impaired Orientation: Person Obsessive Compulsive Thoughts/Behaviors: None  Cognitive Functioning Concentration: Decreased Memory: Recent Impaired, Remote Impaired Is patient IDD: No Insight: Poor Impulse Control: Poor Appetite: Poor Have you had any weight changes? : No Change Sleep: Decreased Total Hours of Sleep: 0 Vegetative Symptoms: None  ADLScreening [facility] Assessment Services) Patient's cognitive ability adequate to safely complete daily activities?: Yes Patient able to express need for assistance with ADLs?: Yes Independently performs ADLs?: Yes (appropriate for developmental age)  Prior Inpatient Therapy Prior Inpatient Therapy: Yes Prior Therapy Dates: 2020 Prior Therapy Facilty/Provider(s): [facility] Reason for Treatment: Substance abuse  Prior Outpatient Therapy Prior Outpatient Therapy: No Does patient have an ACCT team?: No Does patient have Intensive In-House Services?  : No Does patient have [facility] services? : No Does patient have P4CC services?: No  ADL Screening (condition at time of admission) Patient's cognitive ability adequate to safely complete daily activities?: Yes Is the patient deaf or have difficulty hearing?: No Does the patient have difficulty seeing, even when wearing glasses/contacts?: No Does the patient have difficulty concentrating, remembering, or making decisions?: No Patient able to express need for assistance with ADLs?: Yes Does the patient have  difficulty dressing or bathing?: No Independently performs ADLs?: Yes (appropriate for developmental age) Does the  patient have difficulty walking or climbing stairs?: No Weakness of Legs: None Weakness of Arms/Hands: None  Home Assistive Devices/Equipment Home Assistive Devices/Equipment: None  Therapy Consults (therapy consults require a physician order) PT Evaluation Needed: No OT Evalulation Needed: No SLP Evaluation Needed: No Abuse/Neglect Assessment (Assessment to be complete while patient is alone) Abuse/Neglect Assessment Can Be Completed: Unable to assess, patient is non-responsive or altered mental status Values / Beliefs Cultural Requests During Hospitalization: None Spiritual Requests During Hospitalization: None Consults Spiritual Care Consult Needed: No Transition of Care Team Consult Needed: No Advance Directives (For Healthcare) Does Patient Have a Medical Advance Directive?: No Would patient like information on creating a medical advance directive?: No - Patient declined          Disposition: Per Psyc NP patient will be observed overnight and reassessed in the morning Disposition Initial Assessment Completed for this Encounter: Yes  On Site Evaluation by:   Reviewed with Physician:    Leonie Douglas MS [place] 01/08/2020 3:54 AM

## 2020-01-08 NOTE — ED Notes (Signed)
Patient given a snack

## 2020-01-08 NOTE — BH Assessment (Addendum)
Patient is to be admitted to [facility] by Dr. Janese Banks.  Attending Physician will be Dr. Weber Cooks.   Patient has been assigned to room 303, by [facility] Charge Nurse Demetria.

## 2020-01-08 NOTE — ED Notes (Signed)
Pt given breakfast meal tray. 

## 2020-01-08 NOTE — Consult Note (Addendum)
[facility] Consult   Reason for Consult: Psychiatric evaluation Referring Physician: Dr. 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  I jumped and look at my lip." Ivan Hamilton is a 54 y.o. male patient presented to [facility] ED via law enforcement under involuntary commitment status (IVC). The patient has a history of alcohol and drug abuse. It was reported that the patient's ex-wife and officers stated he wanted to kill himself. The patient has a history of self-harm.  The patient alcohol level is 471 mg/dl.  The patient has a long history of PTSD from being in the [employer] in [place].  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  The patient at times gets very emotional, voicing, "I want to die.  Why can't people let me die."  Tonight's visit, he expressed that he jumped from a tree, falling on his face busting his nose, and leaving his lip swollen. The patient was seen face-to-face by this provider; the chart was reviewed and  consulted with Dr. Karma Greaser on 01/08/2020 due to the patient's care. It was discussed with the EDP that the patient would remain under observation overnight and reassess in the morning to determine if he meets the criteria for psychiatric inpatient admission or he can be discharged home.  On evaluation, the patient is alert and oriented x 3, calm and cooperative, and mood-congruent with affect. The patient does not appear to be responding to internal or external stimuli.  The patient is presenting with delusional thinking. The patient admits to auditory and visual hallucinations. The patient admits to suicidal ideation but denies homicidal ideations. The patient is presenting with psychotic behaviors. During an encounter with the patient, he was able to answer some questions appropriately. .   Plan: The patient would remain under observation overnight and reassess in the a.m. when he is no longer intoxicated to determine if he meets the criteria for psychiatric inpatient admission or he could be discharged home.  HPI: Per Dr. Karma Greaser: Ivan Hamilton is a 54 y.o. male with a history of alcohol abuse including prior episodes of alcohol withdrawal and a history of auditory hallucinations and visual hallucinations.  He presents under involuntary commitment by law enforcement due to suicidal ideation.  He admits to daily alcohol abuse and says that he last drank earlier today/yesterday (is currently overnight).  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  He has a great deal of guilt as a result and sometimes his dad soldiers  visit him.  He says he knows it is not real but it is still very upsetting.  As result he frequently wants to die.  He had a fall tonight where he says he "tried to kill himself" by smashing his face on his porch.  He has some pain in his upper lip particularly on the right side.  He also complains of pain all over his  entire body including some back pain rating down his left leg and has been told in the past that he has sciatica.  He has no other acute pain except for the pain in his face.  He is also complaining of some pain in his neck but it is difficult to appreciate if this is acute pain.    Past Psychiatric History:  Alcohol withdrawal ([place]) CVA (cerebral vascular accident) ([place])  Risk to Self: Suicidal Ideation: Yes-Currently Present Suicidal Intent: Yes-Currently Present Is patient at risk for suicide?: Yes Suicidal Plan?: No Specify Current Suicidal Plan: Patient reported that he attempted "yesterday" but did not report a plan Access to Means:  (Unknown) What has been your use of drugs/alcohol within the last 12 months?: Alcohol Abuse How many times?:  (Unknown) Other Self Harm Risks: Alcohol Abuse Triggers for Past Attempts: None known Intentional Self Injurious Behavior:  (Unknown) Risk to Others: Homicidal Ideation: No Thoughts of Harm to Others: No Current Homicidal Intent: No Current Homicidal Plan: No Access to Homicidal Means: No Identified Victim: None History of harm to others?: No Assessment of Violence: None Noted Violent Behavior Description: None Does patient have access to weapons?:  (Unknown) Criminal Charges Pending?:  (Unknown) Does patient have a court date:  (Unknown) Prior Inpatient Therapy: Prior Inpatient Therapy: Yes Prior Therapy Dates: 2020 Prior Therapy Facilty/Provider(s): [facility] Reason for Treatment: Substance abuse Prior Outpatient Therapy: Prior Outpatient Therapy: No Does patient have an ACCT team?: No Does patient have Intensive In-House Services?  : No Does patient have [facility] services? : No Does patient have P4CC services?: No  Past Medical History:  Past Medical History:  Diagnosis Date  . Alcohol withdrawal ([place])   . B12 deficiency   . Chronic pain   . CVA (cerebral vascular accident) ([place])   . Hypertension   . Hypothyroidism   .  Varicose veins     Past Surgical History:  Procedure Laterality Date  . CHOLECYSTECTOMY    . ESOPHAGOGASTRODUODENOSCOPY (EGD) WITH PROPOFOL N/A 10/24/2017   Procedure: ESOPHAGOGASTRODUODENOSCOPY (EGD) WITH PROPOFOL;  Surgeon: [name], MD;  Location: [facility] ENDOSCOPY;  Service: Endoscopy;  Laterality: N/A;  . ESOPHAGOGASTRODUODENOSCOPY (EGD) WITH PROPOFOL N/A 07/12/2019   Procedure: ESOPHAGOGASTRODUODENOSCOPY (EGD) WITH PROPOFOL;  Surgeon: Jonathon Bellows, MD;  Location: [facility] ENDOSCOPY;  Service: Gastroenterology;  Laterality: N/A;  . LEG SURGERY     Family History: No family history on file. Family Psychiatric  History:  Social History:  Social History   Substance and Sexual Activity  Alcohol Use Yes   Comment: daily- 1 pint of vodka     Social History   Substance and Sexual Activity  Drug Use No    Social History   Socioeconomic History  . Marital status: Married    Spouse name: Not on file  . Number of children: Not on file  . Years of education: Not on file  . Highest education level: Not on file  Occupational History  . Not on file  Tobacco Use  . Smoking status: Never Smoker  . Smokeless tobacco: Never Used  Vaping Use  .  Vaping Use: Never used  Substance and Sexual Activity  . Alcohol use: Yes    Comment: daily- 1 pint of vodka  . Drug use: No  . Sexual activity: Not on file  Other Topics Concern  . Not on file  Social History Narrative  . Not on file   Social Determinants of Health   Financial Resource Strain:   . Difficulty of Paying Living Expenses:   Food Insecurity:   . Worried About Charity fundraiser in the Last Year:   . Arboriculturist in the Last Year:   Transportation Needs:   . Film/video editor (Medical):   Marland Kitchen Lack of Transportation (Non-Medical):   Physical Activity:   . Days of Exercise per Week:   . Minutes of Exercise per Session:   Stress:   . Feeling of Stress :   Social Connections:   . Frequency of Communication with  Friends and Family:   . Frequency of Social Gatherings with Friends and Family:   . Attends Religious Services:   . Active Member of Clubs or Organizations:   . Attends Archivist Meetings:   Marland Kitchen Marital Status:    Additional Social History:    Allergies:  No Known Allergies  Labs:  Results for orders placed or performed during the hospital encounter of 01/08/20 (from the past 48 hour(s))  Comprehensive metabolic panel     Status: Abnormal   Collection Time: 01/07/20  8:37 PM  Result Value Ref Range   Sodium 139 135 - 145 mmol/L   Potassium 3.7 3.5 - 5.1 mmol/L   Chloride 102 98 - 111 mmol/L   CO2 22 22 - 32 mmol/L   Glucose, Bld 148 (H) 70 - 99 mg/dL    Comment: Glucose reference range applies only to samples taken after fasting for at least 8 hours.   BUN 8 6 - 20 mg/dL   Creatinine, Ser 0.70 0.61 - 1.24 mg/dL   Calcium 8.7 (L) 8.9 - 10.3 mg/dL   Total Protein 9.3 (H) 6.5 - 8.1 g/dL   Albumin 4.3 3.5 - 5.0 g/dL   AST 81 (H) 15 - 41 U/L   ALT 43 0 - 44 U/L   Alkaline Phosphatase 91 38 - 126 U/L   Total Bilirubin 1.4 (H) 0.3 - 1.2 mg/dL   GFR calc non Af Amer >60 >60 mL/min   GFR calc Af Amer >60 >60 mL/min   Anion gap 15 5 - 15    Comment: Performed at [facility], [address] [place], [place] [address]  Ethanol     Status: Abnormal   Collection Time: 01/07/20  8:37 PM  Result Value Ref Range   Alcohol, Ethyl (B) 471 (HH) <10 mg/dL    Comment: CRITICAL RESULT CALLED TO, READ BACK BY AND VERIFIED WITH LISA THOMBSON @ 2135 ON 01/07/2020 RH (NOTE) Lowest detectable limit for serum alcohol is 10 mg/dL.  For medical purposes only. Performed at [facility], [address] [place], [place] [address]   cbc     Status: Abnormal   Collection Time: 01/07/20  8:37 PM  Result Value Ref Range   WBC 11.0 (H) 4.0 - 10.5 K/uL   RBC 4.79 4.22 - 5.81 MIL/uL   Hemoglobin 12.6 (L) 13.0 - 17.0 g/dL   HCT 37.3 (L) 39 - 52 %   MCV 77.9 (L) 80.0 -  100.0 fL   MCH 26.3 26.0 - 34.0 pg   MCHC 33.8  30.0 - 36.0 g/dL   RDW 19.5 (H) 11.5 - 15.5 %   Platelets 217 150 - 400 K/uL   nRBC 0.0 0.0 - 0.2 %    Comment: Performed at [facility], [address] [place], [place] [address]  Urine Drug Screen, Qualitative     Status: None   Collection Time: 01/07/20  9:45 PM  Result Value Ref Range   Tricyclic, Ur Screen NONE DETECTED NONE DETECTED   Amphetamines, Ur Screen NONE DETECTED NONE DETECTED   MDMA (Ecstasy)Ur Screen NONE DETECTED NONE DETECTED   Cocaine Metabolite,Ur [place] NONE DETECTED NONE DETECTED   Opiate, Ur Screen NONE DETECTED NONE DETECTED   Phencyclidine (PCP) Ur S NONE DETECTED NONE DETECTED   Cannabinoid 50 Ng, Ur [place] NONE DETECTED NONE DETECTED   Barbiturates, Ur Screen NONE DETECTED NONE DETECTED   Benzodiazepine, Ur Scrn NONE DETECTED NONE DETECTED   Methadone Scn, Ur NONE DETECTED NONE DETECTED    Comment: (NOTE) Tricyclics + metabolites, urine    Cutoff 1000 ng/mL Amphetamines + metabolites, urine  Cutoff 1000 ng/mL MDMA (Ecstasy), urine              Cutoff 500 ng/mL Cocaine Metabolite, urine          Cutoff 300 ng/mL Opiate + metabolites, urine        Cutoff 300 ng/mL Phencyclidine (PCP), urine         Cutoff 25 ng/mL Cannabinoid, urine                 Cutoff 50 ng/mL Barbiturates + metabolites, urine  Cutoff 200 ng/mL Benzodiazepine, urine              Cutoff 200 ng/mL Methadone, urine                   Cutoff 300 ng/mL  The urine drug screen provides only a preliminary, unconfirmed analytical test result and should not be used for non-medical purposes. Clinical consideration and professional judgment should be applied to any positive drug screen result due to possible interfering substances. A more specific alternate chemical method must be used in order to obtain a confirmed analytical result. Gas chromatography / mass spectrometry (GC/MS) is the preferred confirm atory method. Performed at [facility], [address] [place], [place] [address]     No current facility-administered medications for this encounter.   Current Outpatient Medications  Medication Sig Dispense Refill  . busPIRone (BUSPAR) 15 MG tablet Take 1 tablet (15 mg total) by mouth 2 (two) times daily. 60 tablet 0  . FLUoxetine (PROZAC) 40 MG capsule Take 1 capsule (40 mg total) by mouth daily. 30 capsule 0  . folic acid (FOLVITE) 1 MG tablet Take 1 tablet (1 mg total) by mouth daily.    Marland Kitchen gabapentin (NEURONTIN) 300 MG capsule Take 2 capsules (600 mg total) by mouth 3 (three) times daily. 180 capsule 1  . hydrOXYzine (ATARAX/VISTARIL) 25 MG tablet Take 25 mg by mouth every 8 (eight) hours as needed for anxiety.    Marland Kitchen levothyroxine (SYNTHROID, LEVOTHROID) 125 MCG tablet Take 1 tablet (125 mcg total) by mouth daily before breakfast. 30 tablet 0  . pantoprazole (PROTONIX) 40 MG tablet Take 40 mg by mouth 2 (two) times daily.    . traZODone (DESYREL) 50 MG tablet Take 100 mg by mouth at bedtime as needed for sleep.      Musculoskeletal: Strength & Muscle Tone: within normal limits Gait & Station: normal Patient  leans: N/A  Psychiatric Specialty Exam: Physical Exam  Constitutional: He is cooperative.  Psychiatric: Judgment normal. His affect is inappropriate. His speech is delayed and slurred. He is slowed and withdrawn. Thought content is delusional. Cognition and memory are impaired. He expresses suicidal ideation.    Review of Systems  Psychiatric/Behavioral: Positive for behavioral problems, confusion, hallucinations, self-injury and suicidal ideas. The patient is nervous/anxious.   All other systems reviewed and are negative.   Blood pressure 125/86, pulse 89, temperature 98.4 F (36.9 C), temperature source Oral, resp. rate 18, height 5\' 5"  (1.651 m), weight 63.5 kg, SpO2 98 %.Body mass index is 23.3 kg/m.  General Appearance: Bizarre and Disheveled  Eye Contact:  Fair  Speech:  Slurred  Volume:   Decreased  Mood:  Depressed, Hopeless and Worthless  Affect:  Blunt, Congruent, Depressed and Flat  Thought Process:  Coherent  Orientation:  Full (Time, Place, and Person)  Thought Content:  Hallucinations: Auditory Visual  Suicidal Thoughts:  Yes.  without intent/plan  Homicidal Thoughts:  No  Memory:  Immediate;   Fair Recent;   Fair Remote;   Fair  Judgement:  Impaired  Insight:  Lacking  Psychomotor Activity:  Shuffling Gait and Tremor  Concentration:  Concentration: Poor and Attention Span: Poor  Recall:  [employer] of Knowledge:  Poor  Language:  Fair  Akathisia:  Negative  Handed:  Right  AIMS (if indicated):     Assets:  Desire for Improvement Leisure Time Physical Health Resilience Social Support  ADL's:  Intact  Cognition:  Impaired,  Mild  Sleep:        Treatment Plan Summary: Daily contact with patient to assess and evaluate symptoms and progress in treatment, Medication management and Plan The patient will remain under observation overnight and reassess in a.m. to determine if he meets criteria for psychiatric inpatient admission or he could be discharged home.  Disposition: Supportive therapy provided about ongoing stressors. The patient will remain under observation overnight and reassess in the a.m. to determine if he meets criteria for psychiatric inpatient admission or he could be discharged home  Caroline Sauger, NP 01/08/2020 4:21 AM

## 2020-01-08 NOTE — Tx Team (Signed)
Initial Treatment Plan 01/08/2020 11:27 PM Darran Gabay JQD:[id number]    PATIENT STRESSORS: Medication change or noncompliance Substance abuse   PATIENT STRENGTHS: Ability for insight Motivation for treatment/growth   PATIENT IDENTIFIED PROBLEMS: Depression  Anxiety  Substance Abuse                 DISCHARGE CRITERIA:  Motivation to continue treatment in a less acute level of care Verbal commitment to aftercare and medication compliance  PRELIMINARY DISCHARGE PLAN: Outpatient therapy Return to previous living arrangement  PATIENT/FAMILY INVOLVEMENT: This treatment plan has been presented to and reviewed with the patient, Filimon Miranda. The patient has been given the opportunity to ask questions and make suggestions.  Mallie Darting, RN 01/08/2020, 11:27 PM

## 2020-01-08 NOTE — ED Notes (Signed)
Patient eating lunch.

## 2020-01-08 NOTE — Progress Notes (Signed)
Patient admitted from [facility], report received from [place], [place]. Patient pleasant upon assessment but presents with an anxious affect and is visibly shaking from alcohol withdrawals, (See MAR). Patient denies SI/HI/AVH but did state that he sometimes hears voices or sees things he knows aren't there. Patient states he hasn't been hearing or seeing anything since he has been in the hospital. Patient oriented to the unit and his room. Patient given education, support and encouragement to be active in his treatment plan. Patient downplays his alcohol abuse stating, "I don't drink that often and only a couple of shots." Patient being monitored Q 15 minutes for safety per unit protocol. Patient remains safe on the unit.

## 2020-01-08 NOTE — Consult Note (Addendum)
[facility] Face-to-Face Psychiatry Consult   Reason for Consult:  Dual diagnosis depression and ETOH dependence intoxication and withdrawal   Referring Physician:  ED MD  Patient Identification: Bhavik Cabiness MRN:  [id number] Principal Diagnosis:   Alcohol Dependence  Bipolar disorder depressed with psychoss Possible psychosis due to ETOH use   Generalized Anxiety   Diagnosis:  Active Problems:   PTSD (post-traumatic stress disorder)   DDD (degenerative disc disease), lumbosacral   Alcohol withdrawal ([place])   Alcohol use disorder, severe, dependence ([place])   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[place])   Alcohol withdrawal delirium ([place])   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[place])   Confusion state   Chronic pain of left lower extremity   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 male patient admitted with ETOH intoxication, withdrawal and depression---Unclear safety margin   He has a BAL ---in the high 400's  He is coming back --down from its effects but says he /has been having increasing depression   He hit his face and head ---while intoxicated   Now here needing dual diagnosis treatment     HPI:  As above he has been having worsening major depression with depressed mood, crying spells lack of energy, motivation concentration enthusiasm, lack of sleep appetite changes and overall passive SI.   He is not sure if medications are helping but at the same time has not had enough of a sober period to know   He has generalized anxiety  with excessive worry, nervousness tension frustration --somatic symptoms, dread fear doom and gloom ---panic like symptoms   Of late he hears voices and see visual hallucinations that are not necessarily related to ETOH use he says.   Past Psychiatric History:   Recent previous --admission for similar issues    Risk to Self: Suicidal Ideation: Yes-Currently Present Suicidal Intent: Yes-Currently Present Is patient at risk for suicide?: Yes Suicidal Plan?: No Specify Current Suicidal Plan: Patient reported that he attempted "yesterday" but did not report a plan Access to Means:  (Unknown) What has been your use of drugs/alcohol within the last 12 months?: Alcohol Abuse How many times?:  (Unknown) Other Self Harm Risks: Alcohol Abuse Triggers for Past Attempts: None known Intentional Self Injurious Behavior:  (Unknown) Risk to Others: Homicidal Ideation: No Thoughts of Harm to Others: No Current Homicidal Intent: No Current Homicidal Plan: No Access to Homicidal Means: No Identified Victim: None History of harm to others?: No Assessment of Violence: None Noted Violent Behavior Description: None Does patient have access to weapons?:  (Unknown) Criminal Charges Pending?:  (Unknown) Does patient have a court date:  (Unknown) Prior Inpatient Therapy: Prior Inpatient Therapy: Yes Prior Therapy Dates: 2020 Prior Therapy Facilty/Provider(s): [facility] Reason for Treatment: Substance abuse Prior Outpatient Therapy: Prior Outpatient Therapy: No Does patient have an ACCT team?: No Does patient have Intensive In-House Services?  : No Does patient have [facility] services? : No Does patient have P4CC services?: No  Past Medical History:  Past Medical History:  Diagnosis Date  . Alcohol withdrawal ([place])   . B12 deficiency   . Chronic pain   . CVA (cerebral vascular accident) ([place])   . Hypertension   . Hypothyroidism   . Varicose veins     Past Surgical History:  Procedure  Laterality Date  . CHOLECYSTECTOMY    . ESOPHAGOGASTRODUODENOSCOPY (EGD) WITH PROPOFOL N/A 10/24/2017   Procedure: ESOPHAGOGASTRODUODENOSCOPY (EGD) WITH PROPOFOL;  Surgeon: [name], MD;  Location[facility] ENDOSCOPY;  Service: Endoscopy;  Laterality: N/A;  . ESOPHAGOGASTRODUODENOSCOPY (EGD) WITH PROPOFOL N/A 07/12/2019   Procedure: ESOPHAGOGASTRODUODENOSCOPY (EGD) WITH PROPOFOL;  Surgeon: Jonathon Bellows, MD;  Location: [facility] ENDOSCOPY;  Service: Gastroenterology;  Laterality: N/A;  . LEG SURGERY     Family History: No family history on file. Family Psychiatric  History:  He does not recall  Social History:  Lives with wife and grown kids he says  Social History   Substance and Sexual Activity  Alcohol Use Yes   Comment: daily- 1 pint of vodka     Social History   Substance and Sexual Activity  Drug Use No    Social History   Socioeconomic History  . Marital status: Married    Spouse name: Not on file  . Number of children: Not on file  . Years of education: Not on file  . Highest education level: Not on file  Occupational History  . Not on file  Tobacco Use  . Smoking status: Never Smoker  . Smokeless tobacco: Never Used  Vaping Use  . Vaping Use: Never used  Substance and Sexual Activity  . Alcohol use: Yes    Comment: daily- 1 pint of vodka  . Drug use: No  . Sexual activity: Not on file  Other Topics Concern  . Not on file  Social History Narrative  . Not on file   Social Determinants of Health   Financial Resource Strain:   . Difficulty of Paying Living Expenses:   Food Insecurity:   . Worried About Charity fundraiser in the Last Year:   . Arboriculturist in the Last Year:   Transportation Needs:   . Film/video editor (Medical):   Marland Kitchen Lack of Transportation (Non-Medical):   Physical Activity:   . Days of Exercise per Week:   . Minutes of Exercise per Session:   Stress:   . Feeling of Stress :   Social Connections:   . Frequency of Communication with  Friends and Family:   . Frequency of Social Gatherings with Friends and Family:   . Attends Religious Services:   . Active Member of Clubs or Organizations:   . Attends Archivist Meetings:   Marland Kitchen Marital Status:    Additional Social History: no recent AA  NA or related programming     Allergies:  No Known Allergies  Labs:  Results for orders placed or performed during the hospital encounter of 01/08/20 (from the past 48 hour(s))  Comprehensive metabolic panel     Status: Abnormal   Collection Time: 01/07/20  8:37 PM  Result Value Ref Range   Sodium 139 135 - 145 mmol/L   Potassium 3.7 3.5 - 5.1 mmol/L   Chloride 102 98 - 111 mmol/L   CO2 22 22 - 32 mmol/L   Glucose, Bld 148 (H) 70 - 99 mg/dL    Comment: Glucose reference range applies only to samples taken after fasting for at least 8 hours.   BUN 8 6 -  20 mg/dL   Creatinine, Ser 0.70 0.61 - 1.24 mg/dL   Calcium 8.7 (L) 8.9 - 10.3 mg/dL   Total Protein 9.3 (H) 6.5 - 8.1 g/dL   Albumin 4.3 3.5 - 5.0 g/dL   AST 81 (H) 15 - 41 U/L   ALT 43 0 - 44 U/L   Alkaline Phosphatase 91 38 - 126 U/L   Total Bilirubin 1.4 (H) 0.3 - 1.2 mg/dL   GFR calc non Af Amer >60 >60 mL/min   GFR calc Af Amer >60 >60 mL/min   Anion gap 15 5 - 15    Comment: Performed at [facility], [address] [place], [place] [address]  Ethanol     Status: Abnormal   Collection Time: 01/07/20  8:37 PM  Result Value Ref Range   Alcohol, Ethyl (B) 471 (HH) <10 mg/dL    Comment: CRITICAL RESULT CALLED TO, READ BACK BY AND VERIFIED WITH LISA THOMBSON @ 2135 ON 01/07/2020 RH (NOTE) Lowest detectable limit for serum alcohol is 10 mg/dL.  For medical purposes only. Performed at [facility], [address] [place], [place] [address]   cbc     Status: Abnormal   Collection Time: 01/07/20  8:37 PM  Result Value Ref Range   WBC 11.0 (H) 4.0 - 10.5 K/uL   RBC 4.79 4.22 - 5.81 MIL/uL   Hemoglobin 12.6 (L) 13.0 - 17.0 g/dL   HCT 37.3  (L) 39 - 52 %   MCV 77.9 (L) 80.0 - 100.0 fL   MCH 26.3 26.0 - 34.0 pg   MCHC 33.8 30.0 - 36.0 g/dL   RDW 19.5 (H) 11.5 - 15.5 %   Platelets 217 150 - 400 K/uL   nRBC 0.0 0.0 - 0.2 %    Comment: Performed at [facility], [address] [place], [place] [address]  Urine Drug Screen, Qualitative     Status: None   Collection Time: 01/07/20  9:45 PM  Result Value Ref Range   Tricyclic, Ur Screen NONE DETECTED NONE DETECTED   Amphetamines, Ur Screen NONE DETECTED NONE DETECTED   MDMA (Ecstasy)Ur Screen NONE DETECTED NONE DETECTED   Cocaine Metabolite,Ur [place] NONE DETECTED NONE DETECTED   Opiate, Ur Screen NONE DETECTED NONE DETECTED   Phencyclidine (PCP) Ur S NONE DETECTED NONE DETECTED   Cannabinoid 50 Ng, Ur [place] NONE DETECTED NONE DETECTED   Barbiturates, Ur Screen NONE DETECTED NONE DETECTED   Benzodiazepine, Ur Scrn NONE DETECTED NONE DETECTED   Methadone Scn, Ur NONE DETECTED NONE DETECTED    Comment: (NOTE) Tricyclics + metabolites, urine    Cutoff 1000 ng/mL Amphetamines + metabolites, urine  Cutoff 1000 ng/mL MDMA (Ecstasy), urine              Cutoff 500 ng/mL Cocaine Metabolite, urine          Cutoff 300 ng/mL Opiate + metabolites, urine        Cutoff 300 ng/mL Phencyclidine (PCP), urine         Cutoff 25 ng/mL Cannabinoid, urine                 Cutoff 50 ng/mL Barbiturates + metabolites, urine  Cutoff 200 ng/mL Benzodiazepine, urine              Cutoff 200 ng/mL Methadone, urine                   Cutoff 300 ng/mL  The urine drug screen provides only a preliminary,  unconfirmed analytical test result and should not be used for non-medical purposes. Clinical consideration and professional judgment should be applied to any positive drug screen result due to possible interfering substances. A more specific alternate chemical method must be used in order to obtain a confirmed analytical result. Gas chromatography / mass spectrometry (GC/MS) is the preferred confirm  atory method. Performed at [facility], [address] [place], [place] [address]     Current Facility-Administered Medications  Medication Dose Route Frequency Provider Last Rate Last Admin  . alum & mag hydroxide-simeth (MAALOX/MYLANTA) 200-200-20 MG/5ML suspension 30 mL  30 mL Oral Q6H PRN Eulas Post, MD      . benztropine (COGENTIN) tablet 0.5 mg  0.5 mg Oral BID Eulas Post, MD      . busPIRone (BUSPAR) tablet 15 mg  15 mg Oral BID Eulas Post, MD      . clonazePAM [place]bye [place]) tablet 1 mg  1 mg Oral BID Eulas Post, MD      . DULoxetine (CYMBALTA) DR capsule 20 mg  20 mg Oral Daily Eulas Post, MD      . FLUoxetine (PROZAC) capsule 40 mg  40 mg Oral Daily Eulas Post, MD      . folic acid (FOLVITE) tablet 1 mg  1 mg Oral Daily Eulas Post, MD      . gabapentin (NEURONTIN) capsule 600 mg  600 mg Oral TID Eulas Post, MD      . haloperidol (HALDOL) tablet 2 mg  2 mg Oral BID Eulas Post, MD      . hydrOXYzine (ATARAX/VISTARIL) tablet 25 mg  25 mg Oral Q8H PRN Eulas Post, MD      . ibuprofen (ADVIL) tablet 600 mg  600 mg Oral Q8H PRN Eulas Post, MD      . Derrill Memo ON 01/09/2020] levothyroxine (SYNTHROID) tablet 125 mcg  125 mcg Oral QAC breakfast Eulas Post, MD      . LORazepam (ATIVAN) injection 0-4 mg  0-4 mg Intravenous Q6H Eulas Post, MD       Or  . LORazepam (ATIVAN) tablet 0-4 mg  0-4 mg Oral Q6H Eulas Post, MD      . Derrill Memo ON 01/10/2020] LORazepam (ATIVAN) injection 0-4 mg  0-4 mg Intravenous Q12H Eulas Post, MD       Or  . Derrill Memo ON 01/10/2020] LORazepam (ATIVAN) tablet 0-4 mg  0-4 mg Oral Q12H Eulas Post, MD      . ondansetron [facility]) tablet 4 mg  4 mg Oral Q8H PRN Eulas Post, MD      . pantoprazole (PROTONIX) EC tablet 40 mg  40 mg Oral BID Eulas Post, MD      . thiamine tablet 100 mg  100 mg Oral Daily Eulas Post, MD       Or  . thiamine (B-1) injection 100 mg   100 mg Intravenous Daily Eulas Post, MD      . traZODone (DESYREL) tablet 100 mg  100 mg Oral QHS PRN Eulas Post, MD       Current Outpatient Medications  Medication Sig Dispense Refill  . busPIRone (BUSPAR) 15 MG tablet Take 1 tablet (15 mg total) by mouth 2 (two) times daily. 60 tablet 0  . FLUoxetine (PROZAC) 40 MG capsule Take 1 capsule (40 mg total) by mouth daily. 30 capsule 0  . folic acid (FOLVITE) 1 MG tablet Take 1 tablet (1 mg total) by mouth daily.    Marland Kitchen gabapentin (NEURONTIN) 300 MG capsule Take 2  capsules (600 mg total) by mouth 3 (three) times daily. 180 capsule 1  . hydrOXYzine (ATARAX/VISTARIL) 25 MG tablet Take 25 mg by mouth every 8 (eight) hours as needed for anxiety.    Marland Kitchen levothyroxine (SYNTHROID, LEVOTHROID) 125 MCG tablet Take 1 tablet (125 mcg total) by mouth daily before breakfast. 30 tablet 0  . pantoprazole (PROTONIX) 40 MG tablet Take 40 mg by mouth 2 (two) times daily.    . traZODone (DESYREL) 50 MG tablet Take 100 mg by mouth at bedtime as needed for sleep.      Musculoskeletal: Strength & Muscle Tone: no new changes  Gait & Station: not clear he is lying down  Patient leans: none for now   Psychiatric Specialty Exam: Physical Exam  Neurological: Coordination normal.    Review of Systems  Blood pressure 125/86, pulse 89, temperature 98.4 F (36.9 C), temperature source Oral, resp. rate 18, height 5\' 5"  (1.651 m), weight 63.5 kg, SpO2 98 %.Body mass index is 23.3 kg/m.  General Appearance: haggard unkept   Eye Contact:  Fair   Speech:  Low tone volume rate   Volume:  Low   Mood:  Depressed   affect:  constricted   Thought Process:  Logical   Orientation:  Times four okay  Thought Content:  Depressive anxious themes  Says he hallucinates with or without alcohol, hears voices and sees various figures and objects   Suicidal Thoughts:  Active   Homicidal Thoughts:  None   Memory:  Past present and immediate okay  Judgement:  Poor    Insight:  Poor   Psychomotor Activity:  Slower    Concentration:  Fair   Recall:  [place] of Knowledge:  Poor   Language:  Normal   Akathisia:  None   Handed:  Not known   AIMS (if indicated):     Assets:  Not clear at this time /helpful family   ADL's:  Impaired when intoxicated   Cognition:  Impaired when intoxicated   Sleep:   not able to sleep      Treatment Plan Summary:  Admitted to EDU psych --with CIWA protocol probable psych admission   Disposition:  Most likely inpatient admission for dual diagnosis   Eulas Post, MD 01/08/2020 12:08 PM

## 2020-01-09 DIAGNOSIS — F3130 Bipolar disorder, current episode depressed, mild or moderate severity, unspecified: Principal | ICD-10-CM

## 2020-01-09 DIAGNOSIS — K7030 Alcoholic cirrhosis of liver without ascites: Secondary | ICD-10-CM

## 2020-01-09 DIAGNOSIS — F1024 Alcohol dependence with alcohol-induced mood disorder: Secondary | ICD-10-CM

## 2020-01-09 DIAGNOSIS — F10951 Alcohol use, unspecified with alcohol-induced psychotic disorder with hallucinations: Secondary | ICD-10-CM

## 2020-01-09 DIAGNOSIS — F10239 Alcohol dependence with withdrawal, unspecified: Secondary | ICD-10-CM

## 2020-01-09 LAB — PROTIME-INR
INR: 1.2 (ref 0.8–1.2)
Prothrombin Time: 15 seconds (ref 11.4–15.2)

## 2020-01-09 LAB — TSH: TSH: 1.534 u[IU]/mL (ref 0.350–4.500)

## 2020-01-09 LAB — AMMONIA: Ammonia: 33 umol/L (ref 9–35)

## 2020-01-09 MED ORDER — ASPIRIN EC 81 MG PO TBEC
81.0000 mg | DELAYED_RELEASE_TABLET | Freq: Every day | ORAL | Status: DC
  Administered 2020-01-09: 81 mg via ORAL
  Filled 2020-01-09: qty 1

## 2020-01-09 MED ORDER — FOLIC ACID 1 MG PO TABS
1.0000 mg | ORAL_TABLET | Freq: Every day | ORAL | Status: DC
  Administered 2020-01-09: 1 mg via ORAL
  Filled 2020-01-09: qty 1

## 2020-01-09 MED ORDER — LEVOTHYROXINE SODIUM 125 MCG PO TABS
125.0000 ug | ORAL_TABLET | Freq: Every day | ORAL | Status: DC
  Administered 2020-01-09: 125 ug via ORAL
  Filled 2020-01-09 (×2): qty 1
  Filled 2020-01-09: qty 3

## 2020-01-09 MED ORDER — LACTULOSE 10 GM/15ML PO SOLN
10.0000 g | Freq: Two times a day (BID) | ORAL | Status: DC
  Administered 2020-01-09 (×2): 10 g via ORAL
  Filled 2020-01-09 (×3): qty 30

## 2020-01-09 MED ORDER — THIAMINE HCL 100 MG PO TABS
100.0000 mg | ORAL_TABLET | Freq: Every day | ORAL | Status: DC
  Administered 2020-01-09: 100 mg via ORAL
  Filled 2020-01-09: qty 1

## 2020-01-09 MED ORDER — GABAPENTIN 300 MG PO CAPS
600.0000 mg | ORAL_CAPSULE | Freq: Three times a day (TID) | ORAL | Status: DC
  Administered 2020-01-09: 600 mg via ORAL
  Filled 2020-01-09: qty 2

## 2020-01-09 MED ORDER — PRAZOSIN HCL 1 MG PO CAPS
1.0000 mg | ORAL_CAPSULE | Freq: Every day | ORAL | Status: DC
  Filled 2020-01-09: qty 1

## 2020-01-09 MED ORDER — LACTULOSE 10 GM/15ML PO SOLN: 10.0000 g | Freq: Three times a day (TID) | ORAL | Status: DC

## 2020-01-09 MED ORDER — CALCIUM CARBONATE ANTACID 500 MG PO CHEW
1.0000 | CHEWABLE_TABLET | Freq: Two times a day (BID) | ORAL | Status: DC
  Administered 2020-01-09: 200 mg via ORAL
  Filled 2020-01-09 (×2): qty 1

## 2020-01-09 MED ORDER — LORAZEPAM 1 MG PO TABS
1.0000 mg | ORAL_TABLET | Freq: Four times a day (QID) | ORAL | Status: DC | PRN
  Administered 2020-01-09: 1 mg via ORAL
  Filled 2020-01-09 (×2): qty 1

## 2020-01-09 MED ORDER — PANTOPRAZOLE SODIUM 40 MG PO TBEC
40.0000 mg | DELAYED_RELEASE_TABLET | Freq: Every day | ORAL | Status: DC
  Administered 2020-01-09: 40 mg via ORAL
  Filled 2020-01-09: qty 1

## 2020-01-09 MED ORDER — PRENATAL MULTIVITAMIN CH
1.0000 | ORAL_TABLET | Freq: Every day | ORAL | Status: DC
  Administered 2020-01-09: 1 via ORAL
  Filled 2020-01-09: qty 1

## 2020-01-09 MED ORDER — QUETIAPINE FUMARATE 200 MG PO TABS: 200.0000 mg | ORAL_TABLET | Freq: Every day | ORAL | Status: DC

## 2020-01-09 MED ORDER — LORAZEPAM 1 MG PO TABS
1.0000 mg | ORAL_TABLET | Freq: Three times a day (TID) | ORAL | Status: DC
  Administered 2020-01-09 (×2): 1 mg via ORAL
  Filled 2020-01-09: qty 1

## 2020-01-09 MED ORDER — QUETIAPINE FUMARATE 100 MG PO TABS
100.0000 mg | ORAL_TABLET | Freq: Every day | ORAL | Status: DC
  Administered 2020-01-09: 100 mg via ORAL
  Filled 2020-01-09: qty 1

## 2020-01-09 MED ORDER — GABAPENTIN 300 MG PO CAPS: 300.0000 mg | ORAL_CAPSULE | Freq: Three times a day (TID) | ORAL | Status: DC

## 2020-01-09 NOTE — [facility] Counselor (Signed)
Adult Comprehensive Assessment  Patient ID: Ivan Hamilton, male   DOB: 1966/06/11, 54 y.o.   MRN: [id number]  Information Source: Information source: Patient  Current Stressors:  Patient states their primary concerns and needs for treatment are:: "something bother me in my brain. After falling off the porch, I wanted to do it again but saw a lot of blood. I went to hospital to get stiches. I lost a lot of friends in the [employer]. They are my brothers. I see and hear things on the porch and my daughter's car and I ask family and they do not see it" Patient states their goals for this hospitilization and ongoing recovery are:: "stop hearing voices" Educational / Learning stressors: None reported.  Employment / Job issues: Patient is unemployed and currently on disability.  Family Relationships: Pt reports having a good relationship with ex-wife.  Financial / Lack of resources (include bankruptcy): Limited income.  Housing / Lack of housing: Stable housing  Physical health (include injuries &life threatening diseases): Pt reports having back and leg problems. Social relationships: None reported  Substance abuse: Pt reports hx of alcoholism.   Living/Environment/Situation:  Living Arrangements: Ex-wife, daughters Living conditions (as described by patient or guardian): Pt lives with ex-wife and children. He says they all take turns/shifts caring for him.  How long has patient lived in current situation?: 13 years  What is atmosphere in current home: Supportive, Loving  Family History:  Marital status: Divorced Number of Years Married: 66 What types of issues is patient dealing with in the relationship?: Pt's drinking had caused problems within the marriage  Are you sexually active?: None reported What is your sexual orientation?: Heterosexual  Has your sexual activity been affected by drugs, alcohol, medication, or emotional stress?: None reported Does patient have children?: Yes How  many children?: 2 How is patient's relationship with their children?: 2 daughters, ages 48 and 41; close relationship   Childhood History:  By whom was/is the patient raised?: Both parents Description of patient's relationship with caregiver when they were a child: Good relationship.  Patient's description of current relationship with people who raised him/her: Mother lives in [place], father passed away a few years ago.  How were you disciplined when you got in trouble as a child/adolescent?: None reported  Does patient have siblings?: Yes Number of Siblings: 8 Description of patient's current relationship with siblings: 5 brothers, 3 sisters; "ok" relationships.  Did patient suffer any verbal/emotional/physical/sexual abuse as a child?: No Did patient suffer from severe childhood neglect?: No Has patient ever been sexually abused/assaulted/raped as an adolescent or adult?: No Was the patient ever a victim of a crime or a disaster?: No Witnessed domestic violence?: No Has patient been effected by domestic violence as an adult?: No  Education:  Highest grade of school patient has completed: Pt reports he graduated high school in [place].   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  Financial Resources:  Financial resources: Disability Does patient have a Programmer, applications or guardian?: No  Alcohol/Substance Abuse:  What has been your use of drugs/alcohol within the last 12 months?: Patient reports alcohol use. Patient reports  drinking about 2-3 shots after breakfast and before lunch, daily.  Alcohol/Substance Abuse  Treatment Hx: Past Tx, Inpatient If yes, describe treatment: [facility] 2016 Has alcohol/substance abuse ever caused legal problems?: No  Social Support System: Patient's Community Support System: Good Describe Community Support System: family  Type of faith/religion: NA How does patient's faith help to cope with current illness?: NA   Leisure/Recreation:  Leisure and Hobbies: Pt reports he works all the time.   Strengths/Needs:  What things does the patient do well?: Pt reports he enjoys working and likes to stay busy.  In what areas does patient struggle / problems for patient: unemployment, alcohol abuse.   Discharge Plan:  Does patient have access to transportation?: Yes Will patient be returning to same living situation after discharge?: Yes Currently receiving community mental health services: No; patient states that he wants alcohol inpatient treatment.  Does patient have financial barriers related to discharge medications?: No; patient has [employer].      Summary/Recommendations:   Summary and Recommendations (to be completed by the evaluator): Patient is a 54 year old Hispanic Male who was brought to for alcohol dependence as well as bipolar disorder who presented to the [facility] emergency department on 01/08/2020 under involuntary commitment.  The patient was brought to the emergency department by police.  He stated he wanted to kill himself.  He admitted to having auditory and visual hallucinations. Patient will benefit from crisis stablization, medication evaluation, group therapy and psychoeducation in addition to case management for discharge. At discharge, it is recommended that patient remail compliant with established discharge plan and continued treatment.  Trecia Rogers. 01/09/2020
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
## 2020-01-09 NOTE — Progress Notes (Signed)
D- Patient alert and oriented. Affect/mood is calm and cooperative. Pt states he is "feeling better". He has been resting a lot in his room.  A- Scheduled medications administered to patient, per MD orders. Support and encouragement provided.  Routine safety checks conducted every 15 minutes.  Patient informed to notify staff with problems or concerns.  R- No adverse drug reactions noted. Patient contracts for safety at this time. Patient compliant with medications and treatment plan. Patient receptive, calm, and cooperative. Patient interacts well with others on the unit.  Patient remains safe at this time.  Collier Bullock RN

## 2020-01-09 NOTE — Plan of Care (Signed)
Pt rates depression 8/10 and anxiety 9/10. Pt denies HI. Pt has passive SI with no plan and contracts for safety. Pt has AVH. Pt was educated on care plan and verbalizes understanding. Pt was encouraged to attend groups. Collier Bullock RN Problem: Education: Goal: Knowledge of [place] [facility] Education information/materials will improve Outcome: Progressing Goal: Emotional status will improve Outcome: Not Progressing Goal: Mental status will improve Outcome: Not Progressing Goal: Verbalization of understanding the information provided will improve Outcome: Progressing   Problem: Safety: Goal: Periods of time without injury will increase Outcome: Progressing   Problem: Education: Goal: Utilization of techniques to improve thought processes will improve Outcome: Progressing Goal: Knowledge of the prescribed therapeutic regimen will improve Outcome: Progressing   Problem: Safety: Goal: Ability to disclose and discuss suicidal ideas will improve Outcome: Progressing Goal: Ability to identify and utilize support systems that promote safety will improve Outcome: Progressing

## 2020-01-09 NOTE — [facility] Group Notes (Signed)
[facility] LCSW Group Therapy Note ° °Date/Time: 01/09/2020 @ 1:30PM ° °Type of Therapy and Topic:  Group Therapy:  Overcoming Obstacles ° °Participation Level:  [facility] PARTICIPATION LEVEL: Did Not Attend ° °Description of Group:   ° In this group patients will be encouraged to explore what they see as obstacles to their own wellness and recovery. They will be guided to discuss their thoughts, feelings, and behaviors related to these obstacles. The group will process together ways to cope with barriers, with attention given to specific choices patients can make. Each patient will be challenged to identify changes they are motivated to make in order to overcome their obstacles. This group will be process-oriented, with patients participating in exploration of their own experiences as well as giving and receiving support and challenge from other group members. ° °Therapeutic Goals: °1. Patient will identify personal and current obstacles as they relate to admission. °2. Patient will identify barriers that currently interfere with their wellness or overcoming obstacles.  °3. Patient will identify feelings, thought process and behaviors related to these barriers. °4. Patient will identify two changes they are willing to make to overcome these obstacles:  ° ° °Summary of Patient Progress ° ° °Unit did not have group due to going outside for rec time with tecs.  ° ° °Therapeutic Modalities:   °Cognitive Behavioral Therapy °Solution Focused Therapy °Motivational Interviewing °Relapse Prevention Therapy ° ° °Genasis Zingale, LCSW °

## 2020-01-09 NOTE — Progress Notes (Signed)
Pt asked for mashed pototes, green beans and broccolli for dinner and breakfast. Collier Bullock RN

## 2020-01-09 NOTE — Tx Team (Cosign Needed)
Interdisciplinary Treatment and Diagnostic Plan Update  01/09/2020 Time of Session: 9:30am Ivan Hamilton MRN: [id number]  Principal Diagnosis: <principal problem not specified>  Secondary Diagnoses: Active Problems:   Bipolar disorder current episode depressed (HCC)   Current Medications:  Current Facility-Administered Medications  Medication Dose Route Frequency Provider Last Rate Last Admin  . acetaminophen (TYLENOL) tablet 650 mg  650 mg Oral Q6H PRN Eulas Post, MD      . alum & mag hydroxide-simeth (MAALOX/MYLANTA) 200-200-20 MG/5ML suspension 30 mL  30 mL Oral Q4H PRN Eulas Post, MD      . aspirin EC tablet 81 mg  81 mg Oral Daily Sharma Covert, MD      . gabapentin (NEURONTIN) capsule 300 mg  300 mg Oral TID Sharma Covert, MD      . hydrOXYzine (ATARAX/VISTARIL) tablet 10 mg  10 mg Oral TID PRN Eulas Post, MD      . levothyroxine (SYNTHROID) tablet 125 mcg  125 mcg Oral Q0600 Sharma Covert, MD      . LORazepam (ATIVAN) tablet 1 mg  1 mg Oral Q6H PRN Sharma Covert, MD      . LORazepam (ATIVAN) tablet 1 mg  1 mg Oral TID Sharma Covert, MD      . magnesium hydroxide (MILK OF MAGNESIA) suspension 30 mL  30 mL Oral Daily PRN Eulas Post, MD      . ondansetron [facility]) tablet 4 mg  4 mg Oral Q8H PRN Dixon, Rashaun M, NP      . pantoprazole (PROTONIX) EC tablet 40 mg  40 mg Oral Daily Sharma Covert, MD      . prazosin (MINIPRESS) capsule 1 mg  1 mg Oral QHS Sharma Covert, MD      . QUEtiapine (SEROQUEL) tablet 200 mg  200 mg Oral QHS Sharma Covert, MD      . traZODone (DESYREL) tablet 100 mg  100 mg Oral QHS PRN Deloria Lair, NP   100 mg at 01/08/20 2211   PTA Medications: Medications Prior to Admission  Medication Sig Dispense Refill Last Dose  . busPIRone (BUSPAR) 15 MG tablet Take 1 tablet (15 mg total) by mouth 2 (two) times daily. (Patient not taking: Reported on 01/08/2020) 60 tablet 0   . calcium carbonate  (TUMS - DOSED IN MG ELEMENTAL CALCIUM) 500 MG chewable tablet Chew 1 tablet by mouth daily.     . ferrous sulfate 325 (65 FE) MG EC tablet Take 1 tablet by mouth daily.     Marland Kitchen FLUoxetine (PROZAC) 40 MG capsule Take 1 capsule (40 mg total) by mouth daily. (Patient not taking: Reported on 01/08/2020) 30 capsule 0   . folic acid (FOLVITE) 1 MG tablet Take 1 tablet (1 mg total) by mouth daily. (Patient not taking: Reported on 01/08/2020)     . gabapentin (NEURONTIN) 600 MG tablet Take 600 mg by mouth 3 (three) times daily.     . hydrOXYzine (ATARAX/VISTARIL) 25 MG tablet Take 25 mg by mouth in the morning and at bedtime.      Marland Kitchen levothyroxine (SYNTHROID, LEVOTHROID) 125 MCG tablet Take 1 tablet (125 mcg total) by mouth daily before breakfast. 30 tablet 0   . pantoprazole (PROTONIX) 40 MG tablet Take 40 mg by mouth 2 (two) times daily. (Patient not taking: Reported on 01/08/2020)     . QUEtiapine (SEROQUEL) 25 MG tablet Take by mouth.     . thiamine 100 MG tablet Take by mouth.     Marland Kitchen  traZODone (DESYREL) 50 MG tablet Take 100 mg by mouth at bedtime as needed for sleep.       Patient Stressors: Medication change or noncompliance Substance abuse  Patient Strengths: Ability for insight Motivation for treatment/growth  Treatment Modalities: Medication Management, Group therapy, Case management,  1 to 1 session with clinician, Psychoeducation, Recreational therapy.   Physician Treatment Plan for Primary Diagnosis: <principal problem not specified> Long Term Goal(s):     Short Term Goals:    Medication Management: Evaluate patient's response, side effects, and tolerance of medication regimen.  Therapeutic Interventions: 1 to 1 sessions, Unit Group sessions and Medication administration.  Evaluation of Outcomes: Progressing  Physician Treatment Plan for Secondary Diagnosis: Active Problems:   Bipolar disorder current episode depressed ([place])  Long Term Goal(s):     Short Term Goals:        Medication Management: Evaluate patient's response, side effects, and tolerance of medication regimen.  Therapeutic Interventions: 1 to 1 sessions, Unit Group sessions and Medication administration.  Evaluation of Outcomes: Progressing   RN Treatment Plan for Primary Diagnosis: <principal problem not specified> Long Term Goal(s): Knowledge of disease and therapeutic regimen to maintain health will improve  Short Term Goals: Ability to participate in decision making will improve, Ability to verbalize feelings will improve, Ability to disclose and discuss suicidal ideas, Ability to identify and develop effective coping behaviors will improve and Compliance with prescribed medications will improve  Medication Management: RN will administer medications as ordered by provider, will assess and evaluate patient's response and provide education to patient for prescribed medication. RN will report any adverse and/or side effects to prescribing provider.  Therapeutic Interventions: 1 on 1 counseling sessions, Psychoeducation, Medication administration, Evaluate responses to treatment, Monitor vital signs and CBGs as ordered, Perform/monitor CIWA, COWS, AIMS and Fall Risk screenings as ordered, Perform wound care treatments as ordered.  Evaluation of Outcomes: Progressing   LCSW Treatment Plan for Primary Diagnosis: <principal problem not specified> Long Term Goal(s): Safe transition to appropriate next level of care at discharge, Engage patient in therapeutic group addressing interpersonal concerns.  Short Term Goals: Engage patient in aftercare planning with referrals and resources and Increase skills for wellness and recovery  Therapeutic Interventions: Assess for all discharge needs, 1 to 1 time with Social worker, Explore available resources and support systems, Assess for adequacy in community support network, Educate family and significant other(s) on suicide prevention, Complete Psychosocial  Assessment, Interpersonal group therapy.  Evaluation of Outcomes: Progressing   Progress in Treatment: Attending groups: As evidenced by:  Has not attended a group, yet Participating in groups: As evidenced by:  Has not attended groups yet Taking medication as prescribed: Yes. Toleration medication: Yes. Family/Significant other contact made: No, will contact:  Not yet Patient understands diagnosis: Yes. Discussing patient identified problems/goals with staff: Yes. Medical problems stabilized or resolved: Yes. Denies suicidal/homicidal ideation: Yes. Issues/concerns per patient self-inventory: No. Other:   New problem(s) identified: No, Describe:  None  New Short Term/Long Term Goal(s): Medication stabilization, elimination of SI thoughts, and development of a comprehensive mental wellness plan.   Patient Goals:  "Get disability and stop hearing the voices"  Discharge Plan or Barriers: CSW will continue to follow up for appropriate referrals and possible discharge planning  Reason for Continuation of Hospitalization: Depression Hallucinations Suicidal ideation  Estimated Length of Stay: 3-5 days   Attendees: Patient: Ivan Hamilton 01/09/2020   Physician: Dr. Myles Lipps, MD 01/09/2020   Nursing:  01/09/2020   RN Care  Manager: 01/09/2020   Social Worker: Ardelle Anton, LCSW  01/09/2020   Recreational Therapist:  01/09/2020   Other:  01/09/2020   Other:  01/09/2020   Other: 01/09/2020    Scribe for Treatment Team: Trecia Rogers, LCSW 01/09/2020 10:15 AM

## 2020-01-09 NOTE — [facility] Suicide Risk Assessment (Signed)
[facility] Admission Suicide Risk Assessment   Nursing information obtained from:  Patient Demographic factors:  NA Current Mental Status:  NA Loss Factors:  NA Historical Factors:  NA Risk Reduction Factors:  NA  Total Time spent with patient: 45 minutes Principal Problem: <principal problem not specified> Diagnosis:  Active Problems:   Bipolar disorder current episode depressed (HCC)  Subjective Data: Patient is seen and examined.  Patient is a 54 year old male with a reported past psychiatric history significant for alcohol dependence as well as bipolar disorder who presented to the [facility] emergency department on 01/08/2020 under involuntary commitment.  The patient was brought to the emergency department by police.  He stated he wanted to kill himself.  He admitted to having auditory and visual hallucinations.  He stated he drinks alcohol daily.  He stated he drinks tequila.  On evaluation in the emergency room he appeared to be significantly intoxicated.  He appears to have some traumatic injuries to his face.  His lips are swollen from injury.  He admitted that he had fallen on his face earlier in the day.  His blood alcohol on admission was 471.  He was seen by the consult service in the emergency room, and the decision was made to admit the patient to the hospital for evaluation and stabilization.  This a.m. he is significantly tremulous.  He is significantly tachycardic with a rate of 127, and has a low-grade temperature at 99.  His CIWA at 6 AM this morning was 8.  He did sleep 7.25 hours.  He stated that he had been taking his psychiatric medications at home, but was unsure what those medications were.  He stated he is not working, and is on disability for psychiatric reasons.  He stated the day prior to admission that he had "3 drinks".  His last psychiatric hospitalization at our facility was in February 2020.  At that time he was diagnosed with alcohol dependence, major  depression with psychotic features.  His discharge medications included BuSpar, fluoxetine, gabapentin, levothyroxine, prazosin and Seroquel.  From a medical standpoint he has a history of hypothyroidism as well as cirrhosis.  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  Prescriptions at that time included fluoxetine 60 mg p.o. daily and Seroquel 25 mg p.o. nightly.  His medication list at that time also included Neurontin, BuSpar, folic acid, lactulose, multivitamin and trazodone.  Unfortunately in the emergency room an ammonia was not obtained.  His AST was 81 and his ALT was 43.  He had a CT scan of the head done that revealed no acute intracranial abnormality.  There were findings consistent with mild age-related atrophy and chronic small vessel disease.  There were no acute facial fractures, no acute fracture or malalignment of the spine.  Continued Clinical Symptoms:  Alcohol Use Disorder Identification Test Final Score (AUDIT): 9 The "Alcohol Use Disorders Identification Test", Guidelines for Use in Primary Care, Second Edition.  World Pharmacologist [facility]). Score between 0-7:  no or low risk or alcohol related problems. Score between 8-15:  moderate risk of alcohol related problems. Score between 16-19:  high risk of alcohol related problems. Score 20 or above:  warrants further diagnostic evaluation for alcohol dependence and treatment.   CLINICAL FACTORS:   Depression:   Anhedonia Comorbid alcohol abuse/dependence Hopelessness Impulsivity Insomnia Alcohol/Substance Abuse/Dependencies More than one psychiatric diagnosis Currently Psychotic Previous Psychiatric Diagnoses and Treatments  Musculoskeletal: Strength & Muscle Tone: within normal limits Gait & Station: unsteady Patient leans: N/A  Psychiatric Specialty Exam: Physical Exam  Nursing note and vitals reviewed. HENT:  Head: Normocephalic  and atraumatic.  Respiratory: Effort normal.  Neurological: He is alert.    Review of Systems  Blood pressure (!) 126/92, pulse (!) 127, temperature 99 F (37.2 C), temperature source Oral, resp. rate 20, height 5\' 5"  (1.651 m), weight 63.5 kg, SpO2 99 %.Body mass index is 23.3 kg/m.  General Appearance: Disheveled  Eye Contact:  Fair  Speech:  Normal Rate  Volume:  Increased  Mood:  Anxious and Dysphoric  Affect:  Labile  Thought Process:  Goal Directed and Descriptions of Associations: Circumstantial  Orientation:  Negative  Thought Content:  Hallucinations: Auditory  Suicidal Thoughts:  No  Homicidal Thoughts:  No  Memory:  Immediate;   Poor Recent;   Poor Remote;   Poor  Judgement:  Impaired  Insight:  Fair  Psychomotor Activity:  Increased  Concentration:  Concentration: Poor and Attention Span: Poor  Recall:  Poor  Fund of Knowledge:  Poor  Language:  Fair  Akathisia:  Negative  Handed:  Right  AIMS (if indicated):     Assets:  Desire for Improvement Resilience  ADL's:  Impaired  Cognition:  WNL  Sleep:  Number of Hours: 7.25      COGNITIVE FEATURES THAT CONTRIBUTE TO RISK:  None    SUICIDE RISK:   Moderate:  Frequent suicidal ideation with limited intensity, and duration, some specificity in terms of plans, no associated intent, good self-control, limited dysphoria/symptomatology, some risk factors present, and identifiable protective factors, including available and accessible social support.  PLAN OF CARE: Patient is seen and examined.  Patient is a 54 year old male with the above-stated past psychiatric history who was admitted secondary to suicidal ideation, worsening depression and significant intoxication and a history of significant alcohol withdrawal.  He will be admitted to the hospital.  He will be integrated in the milieu.  He will be encouraged to attend groups.  Unfortunately he had not been placed on any lorazepam for withdrawal.  His capital CIWA  was 8 this morning.  Given his tremulousness at this point.  I am going to put him on lorazepam 1 mg p.o. 3 times daily the day as well is 1 mg p.o. every 6 hours as needed a CIWA greater than 10.  I will also place him on his seizure precautions.  He is complaining of auditory hallucinations, and it looks like he had been previously discharged on Seroquel 200 mg, but it looks in the outpatient clinic like it was 25 mg p.o. nightly.  Tonight I will reduce it to 100 mg p.o. nightly.  Given his tremulousness I may give him 25 mg right now just to help calm him down.  Given his history of cirrhosis unguinal hold off on his antidepressant medications at this point.  I would like to get a pneumonia before we go down that road.  Once his ammonia is stable we will go on and restart his antidepressant medications.  I will order an ammonia for today, and we will also start lactulose given his continued alcoholism and his history of cirrhosis.  A PT and INR were also not obtained, and I will do that to assess his synthetic function.  He has a history of hypothyroidism and his levothyroxine will be continued.  His blood pressure stable, but he is significantly tachycardic.  His rate is  127.  I suspect that is most likely related to alcohol withdrawal symptoms.  We will obtain an EKG to assess the rhythm.  I certify that inpatient services furnished can reasonably be expected to improve the patient's condition.   Sharma Covert, MD 01/09/2020, 10:10 AM

## 2020-01-10 ENCOUNTER — Inpatient Hospital Stay
Admission: EM | Admit: 2020-01-10 | Discharge: 2020-01-14 | DRG: 897 | Disposition: A | Discharge status: Home / Self Care | Payer: Medicare HMO | Source: Intra-hospital | Attending: Internal Medicine | Admitting: Internal Medicine

## 2020-01-10 ENCOUNTER — Encounter: Payer: Self-pay | Admitting: Family Medicine

## 2020-01-10 ENCOUNTER — Observation Stay: Payer: Medicare HMO

## 2020-01-10 ENCOUNTER — Other Ambulatory Visit: Payer: Self-pay

## 2020-01-10 DIAGNOSIS — F10231 Alcohol dependence with withdrawal delirium: Principal | ICD-10-CM | POA: Diagnosis present

## 2020-01-10 DIAGNOSIS — D696 Thrombocytopenia, unspecified: Secondary | ICD-10-CM | POA: Diagnosis present

## 2020-01-10 DIAGNOSIS — F1994 Other psychoactive substance use, unspecified with psychoactive substance-induced mood disorder: Secondary | ICD-10-CM | POA: Diagnosis present

## 2020-01-10 DIAGNOSIS — G8929 Other chronic pain: Secondary | ICD-10-CM | POA: Diagnosis present

## 2020-01-10 DIAGNOSIS — F419 Anxiety disorder, unspecified: Secondary | ICD-10-CM

## 2020-01-10 DIAGNOSIS — M79605 Pain in left leg: Secondary | ICD-10-CM | POA: Diagnosis present

## 2020-01-10 DIAGNOSIS — F10230 Alcohol dependence with withdrawal, uncomplicated: Secondary | ICD-10-CM

## 2020-01-10 DIAGNOSIS — F319 Bipolar disorder, unspecified: Secondary | ICD-10-CM | POA: Diagnosis present

## 2020-01-10 DIAGNOSIS — E861 Hypovolemia: Secondary | ICD-10-CM

## 2020-01-10 DIAGNOSIS — Z8249 Family history of ischemic heart disease and other diseases of the circulatory system: Secondary | ICD-10-CM

## 2020-01-10 DIAGNOSIS — F4310 Post-traumatic stress disorder, unspecified: Secondary | ICD-10-CM | POA: Diagnosis present

## 2020-01-10 DIAGNOSIS — F32A Depression, unspecified: Secondary | ICD-10-CM

## 2020-01-10 DIAGNOSIS — M545 Low back pain: Secondary | ICD-10-CM | POA: Diagnosis present

## 2020-01-10 DIAGNOSIS — R519 Headache, unspecified: Secondary | ICD-10-CM | POA: Diagnosis not present

## 2020-01-10 DIAGNOSIS — R220 Localized swelling, mass and lump, head: Secondary | ICD-10-CM | POA: Diagnosis not present

## 2020-01-10 DIAGNOSIS — E039 Hypothyroidism, unspecified: Secondary | ICD-10-CM | POA: Diagnosis not present

## 2020-01-10 DIAGNOSIS — K7460 Unspecified cirrhosis of liver: Secondary | ICD-10-CM | POA: Diagnosis present

## 2020-01-10 DIAGNOSIS — M5137 Other intervertebral disc degeneration, lumbosacral region: Secondary | ICD-10-CM | POA: Diagnosis present

## 2020-01-10 DIAGNOSIS — S01511A Laceration without foreign body of lip, initial encounter: Secondary | ICD-10-CM | POA: Diagnosis present

## 2020-01-10 DIAGNOSIS — F10239 Alcohol dependence with withdrawal, unspecified: Secondary | ICD-10-CM | POA: Diagnosis not present

## 2020-01-10 DIAGNOSIS — Z20822 Contact with and (suspected) exposure to covid-19: Secondary | ICD-10-CM | POA: Diagnosis present

## 2020-01-10 DIAGNOSIS — F10939 Alcohol use, unspecified with withdrawal, unspecified: Secondary | ICD-10-CM | POA: Diagnosis present

## 2020-01-10 DIAGNOSIS — F329 Major depressive disorder, single episode, unspecified: Secondary | ICD-10-CM

## 2020-01-10 DIAGNOSIS — Z596 Low income: Secondary | ICD-10-CM | POA: Diagnosis not present

## 2020-01-10 DIAGNOSIS — R45851 Suicidal ideations: Secondary | ICD-10-CM | POA: Diagnosis not present

## 2020-01-10 DIAGNOSIS — Y908 Blood alcohol level of 240 mg/100 ml or more: Secondary | ICD-10-CM | POA: Diagnosis present

## 2020-01-10 DIAGNOSIS — F209 Schizophrenia, unspecified: Secondary | ICD-10-CM | POA: Diagnosis present

## 2020-01-10 DIAGNOSIS — G629 Polyneuropathy, unspecified: Secondary | ICD-10-CM | POA: Diagnosis present

## 2020-01-10 DIAGNOSIS — Z79899 Other long term (current) drug therapy: Secondary | ICD-10-CM

## 2020-01-10 DIAGNOSIS — S0083XA Contusion of other part of head, initial encounter: Secondary | ICD-10-CM | POA: Diagnosis present

## 2020-01-10 DIAGNOSIS — W1789XA Other fall from one level to another, initial encounter: Secondary | ICD-10-CM | POA: Diagnosis present

## 2020-01-10 DIAGNOSIS — F411 Generalized anxiety disorder: Secondary | ICD-10-CM | POA: Diagnosis present

## 2020-01-10 DIAGNOSIS — I9589 Other hypotension: Secondary | ICD-10-CM

## 2020-01-10 DIAGNOSIS — Z79890 Hormone replacement therapy (postmenopausal): Secondary | ICD-10-CM

## 2020-01-10 DIAGNOSIS — I959 Hypotension, unspecified: Secondary | ICD-10-CM | POA: Diagnosis present

## 2020-01-10 DIAGNOSIS — I10 Essential (primary) hypertension: Secondary | ICD-10-CM | POA: Diagnosis present

## 2020-01-10 DIAGNOSIS — Z8673 Personal history of transient ischemic attack (TIA), and cerebral infarction without residual deficits: Secondary | ICD-10-CM

## 2020-01-10 LAB — CBC WITH DIFFERENTIAL/PLATELET
Abs Immature Granulocytes: 0.01 10*3/uL (ref 0.00–0.07)
Basophils Absolute: 0 10*3/uL (ref 0.0–0.1)
Basophils Relative: 0 %
Eosinophils Absolute: 0 10*3/uL (ref 0.0–0.5)
Eosinophils Relative: 1 %
HCT: 30.6 % — ABNORMAL LOW (ref 39.0–52.0)
Hemoglobin: 10.4 g/dL — ABNORMAL LOW (ref 13.0–17.0)
Immature Granulocytes: 0 %
Lymphocytes Relative: 20 %
Lymphs Abs: 0.8 10*3/uL (ref 0.7–4.0)
MCH: 26.5 pg (ref 26.0–34.0)
MCHC: 34 g/dL (ref 30.0–36.0)
MCV: 78.1 fL — ABNORMAL LOW (ref 80.0–100.0)
Monocytes Absolute: 0.2 10*3/uL (ref 0.1–1.0)
Monocytes Relative: 6 %
Neutro Abs: 3.1 10*3/uL (ref 1.7–7.7)
Neutrophils Relative %: 73 %
Platelets: UNDETERMINED 10*3/uL (ref 150–400)
RBC: 3.92 MIL/uL — ABNORMAL LOW (ref 4.22–5.81)
RDW: 18.8 % — ABNORMAL HIGH (ref 11.5–15.5)
WBC: 4.3 10*3/uL (ref 4.0–10.5)
nRBC: 0 % (ref 0.0–0.2)

## 2020-01-10 LAB — MRSA PCR SCREENING: MRSA by PCR: POSITIVE — AB

## 2020-01-10 LAB — PROTIME-INR
INR: 1.3 — ABNORMAL HIGH (ref 0.8–1.2)
Prothrombin Time: 15.2 seconds (ref 11.4–15.2)

## 2020-01-10 LAB — COMPREHENSIVE METABOLIC PANEL
ALT: 42 U/L (ref 0–44)
AST: 70 U/L — ABNORMAL HIGH (ref 15–41)
Albumin: 3.6 g/dL (ref 3.5–5.0)
Alkaline Phosphatase: 76 U/L (ref 38–126)
Anion gap: 11 (ref 5–15)
BUN: 5 mg/dL — ABNORMAL LOW (ref 6–20)
CO2: 24 mmol/L (ref 22–32)
Calcium: 9.3 mg/dL (ref 8.9–10.3)
Chloride: 95 mmol/L — ABNORMAL LOW (ref 98–111)
Creatinine, Ser: 0.66 mg/dL (ref 0.61–1.24)
GFR calc Af Amer: >60 mL/min (ref >60)
GFR calc non Af Amer: >60 mL/min (ref >60)
Glucose, Bld: 138 mg/dL — ABNORMAL HIGH (ref 70–99)
Potassium: 3.3 mmol/L — ABNORMAL LOW (ref 3.5–5.1)
Sodium: 130 mmol/L — ABNORMAL LOW (ref 135–145)
Total Bilirubin: 2.1 mg/dL — ABNORMAL HIGH (ref 0.3–1.2)
Total Protein: 7.4 g/dL (ref 6.5–8.1)

## 2020-01-10 LAB — GLUCOSE, CAPILLARY: Glucose-Capillary: 109 mg/dL — ABNORMAL HIGH (ref 70–99)

## 2020-01-10 LAB — TSH: TSH: 5.691 u[IU]/mL — ABNORMAL HIGH (ref 0.350–4.500)

## 2020-01-10 LAB — MAGNESIUM: Magnesium: 1.4 mg/dL — ABNORMAL LOW (ref 1.7–2.4)

## 2020-01-10 LAB — APTT: aPTT: 32 seconds (ref 24–36)

## 2020-01-10 LAB — PHOSPHORUS: Phosphorus: 2.8 mg/dL (ref 2.5–4.6)

## 2020-01-10 MED ORDER — ENOXAPARIN SODIUM 40 MG/0.4ML ~~LOC~~ SOLN: 40.0000 mg | SUBCUTANEOUS | Status: DC

## 2020-01-10 MED ORDER — ENOXAPARIN SODIUM 40 MG/0.4ML ~~LOC~~ SOLN
40.0000 mg | SUBCUTANEOUS | Status: DC
Start: 2020-01-10 — End: 2020-01-10

## 2020-01-10 MED ORDER — SODIUM CHLORIDE 0.9 % IV SOLN: INTRAVENOUS | Status: DC

## 2020-01-10 MED ORDER — ACETAMINOPHEN 325 MG PO TABS: 650.0000 mg | ORAL_TABLET | Freq: Four times a day (QID) | ORAL | Status: DC | PRN

## 2020-01-10 MED ORDER — ACETAMINOPHEN 650 MG RE SUPP: 650.0000 mg | Freq: Four times a day (QID) | RECTAL | Status: DC | PRN

## 2020-01-10 MED ORDER — POTASSIUM CHLORIDE CRYS ER 20 MEQ PO TBCR
40.0000 meq | EXTENDED_RELEASE_TABLET | Freq: Once | ORAL | Status: AC
  Administered 2020-01-10: 40 meq via ORAL
  Filled 2020-01-10: qty 2

## 2020-01-10 MED ORDER — THIAMINE HCL 100 MG/ML IJ SOLN: 100.0000 mg | Freq: Every day | INTRAMUSCULAR | Status: DC

## 2020-01-10 MED ORDER — DEXMEDETOMIDINE HCL IN NACL 400 MCG/100ML IV SOLN
0.4000 ug/kg/h | INTRAVENOUS | Status: DC
  Administered 2020-01-10: 0.6 ug/kg/h via INTRAVENOUS
  Filled 2020-01-10 (×2): qty 100

## 2020-01-10 MED ORDER — PANTOPRAZOLE SODIUM 40 MG PO TBEC: 40.0000 mg | DELAYED_RELEASE_TABLET | Freq: Two times a day (BID) | ORAL | Status: DC

## 2020-01-10 MED ORDER — FLUOXETINE HCL 20 MG PO CAPS
40.0000 mg | ORAL_CAPSULE | Freq: Every day | ORAL | Status: DC
  Administered 2020-01-10 – 2020-01-14 (×5): 40 mg via ORAL
  Filled 2020-01-10 (×6): qty 2

## 2020-01-10 MED ORDER — ONDANSETRON HCL 4 MG/2ML IJ SOLN: 4.0000 mg | Freq: Four times a day (QID) | INTRAMUSCULAR | Status: DC | PRN

## 2020-01-10 MED ORDER — ADULT MULTIVITAMIN W/MINERALS CH
1.0000 | ORAL_TABLET | Freq: Every day | ORAL | Status: DC
  Administered 2020-01-10 – 2020-01-14 (×5): 1 via ORAL
  Filled 2020-01-10 (×5): qty 1

## 2020-01-10 MED ORDER — THIAMINE HCL 100 MG PO TABS
100.0000 mg | ORAL_TABLET | Freq: Every day | ORAL | Status: DC
  Administered 2020-01-10: 100 mg via ORAL
  Filled 2020-01-10: qty 1

## 2020-01-10 MED ORDER — CHLORHEXIDINE GLUCONATE CLOTH 2 % EX PADS
6.0000 | MEDICATED_PAD | Freq: Every day | CUTANEOUS | Status: DC
  Administered 2020-01-10 – 2020-01-13 (×3): 6 via TOPICAL

## 2020-01-10 MED ORDER — LORAZEPAM 2 MG/ML IJ SOLN: 2.0000 mg | Freq: Four times a day (QID) | INTRAMUSCULAR | Status: DC | PRN

## 2020-01-10 MED ORDER — HALOPERIDOL LACTATE 5 MG/ML IJ SOLN: 10.0000 mg | Freq: Once | INTRAMUSCULAR | Status: DC

## 2020-01-10 MED ORDER — MAGNESIUM HYDROXIDE 400 MG/5ML PO SUSP: 30.0000 mL | Freq: Every day | ORAL | Status: DC | PRN

## 2020-01-10 MED ORDER — TRAZODONE HCL 50 MG PO TABS
25.0000 mg | ORAL_TABLET | Freq: Every evening | ORAL | Status: DC | PRN
  Administered 2020-01-12: 25 mg via ORAL
  Filled 2020-01-10: qty 1

## 2020-01-10 MED ORDER — DEXMEDETOMIDINE HCL IN NACL 200 MCG/50ML IV SOLN: 0.4000 ug/kg/h | INTRAVENOUS | Status: DC

## 2020-01-10 MED ORDER — BUSPIRONE HCL 15 MG PO TABS
15.0000 mg | ORAL_TABLET | Freq: Two times a day (BID) | ORAL | Status: DC
  Administered 2020-01-10 – 2020-01-14 (×8): 15 mg via ORAL
  Filled 2020-01-10 (×10): qty 1

## 2020-01-10 MED ORDER — FERROUS SULFATE 325 (65 FE) MG PO TABS: 325.0000 mg | ORAL_TABLET | Freq: Every day | ORAL | Status: DC

## 2020-01-10 MED ORDER — LEVOTHYROXINE SODIUM 50 MCG PO TABS: 125.0000 ug | ORAL_TABLET | Freq: Every day | ORAL | Status: DC

## 2020-01-10 MED ORDER — THIAMINE HCL 100 MG/ML IJ SOLN
500.0000 mg | Freq: Three times a day (TID) | INTRAVENOUS | Status: AC
  Administered 2020-01-10 – 2020-01-13 (×10): 500 mg via INTRAVENOUS
  Filled 2020-01-10 (×12): qty 5

## 2020-01-10 MED ORDER — DEXMEDETOMIDINE HCL IN NACL 400 MCG/100ML IV SOLN
0.4000 ug/kg/h | INTRAVENOUS | Status: DC
  Administered 2020-01-10: 1.2 ug/kg/h via INTRAVENOUS
  Administered 2020-01-10: 1 ug/kg/h via INTRAVENOUS

## 2020-01-10 MED ORDER — LORAZEPAM 2 MG/ML IJ SOLN: 1.0000 mg | INTRAMUSCULAR | Status: DC | PRN

## 2020-01-10 MED ORDER — LORAZEPAM 2 MG/ML IJ SOLN: 2.0000 mg | Freq: Once | INTRAMUSCULAR | Status: DC

## 2020-01-10 MED ORDER — LORAZEPAM 2 MG/ML IJ SOLN
1.0000 mg | INTRAMUSCULAR | Status: DC | PRN
  Administered 2020-01-10: 4 mg via INTRAVENOUS
  Administered 2020-01-10: 3 mg via INTRAVENOUS
  Filled 2020-01-10 (×2): qty 2

## 2020-01-10 MED ORDER — ONDANSETRON HCL 4 MG PO TABS: 4.0000 mg | ORAL_TABLET | Freq: Four times a day (QID) | ORAL | Status: DC | PRN

## 2020-01-10 MED ORDER — DIAZEPAM 5 MG PO TABS: 10.0000 mg | ORAL_TABLET | Freq: Three times a day (TID) | ORAL | Status: DC

## 2020-01-10 MED ORDER — BUSPIRONE HCL 5 MG PO TABS: 15.0000 mg | ORAL_TABLET | Freq: Two times a day (BID) | ORAL | Status: DC

## 2020-01-10 MED ORDER — TRAZODONE HCL 100 MG PO TABS: 100.0000 mg | ORAL_TABLET | Freq: Every evening | ORAL | Status: DC | PRN

## 2020-01-10 MED ORDER — FOLIC ACID 1 MG PO TABS
1.0000 mg | ORAL_TABLET | Freq: Every day | ORAL | Status: DC
  Administered 2020-01-10 – 2020-01-14 (×4): 1 mg via ORAL
  Filled 2020-01-10 (×5): qty 1

## 2020-01-10 MED ORDER — TRAZODONE HCL 50 MG PO TABS: 25.0000 mg | ORAL_TABLET | Freq: Every evening | ORAL | Status: DC | PRN

## 2020-01-10 MED ORDER — DIPHENHYDRAMINE HCL 50 MG/ML IJ SOLN: 50.0000 mg | Freq: Once | INTRAMUSCULAR | Status: DC

## 2020-01-10 MED ORDER — HYDROXYZINE HCL 25 MG PO TABS: 25.0000 mg | ORAL_TABLET | Freq: Three times a day (TID) | ORAL | Status: DC | PRN

## 2020-01-10 MED ORDER — CALCIUM CARBONATE ANTACID 500 MG PO CHEW: 1.0000 | CHEWABLE_TABLET | Freq: Every day | ORAL | Status: DC

## 2020-01-10 MED ORDER — FOLIC ACID 1 MG PO TABS: 1.0000 mg | ORAL_TABLET | Freq: Every day | ORAL | Status: DC

## 2020-01-10 MED ORDER — ACETAMINOPHEN 650 MG RE SUPP
650.0000 mg | Freq: Four times a day (QID) | RECTAL | Status: DC | PRN
Start: 2020-01-10 — End: 2020-01-10

## 2020-01-10 MED ORDER — QUETIAPINE FUMARATE 25 MG PO TABS: 25.0000 mg | ORAL_TABLET | Freq: Every day | ORAL | Status: DC

## 2020-01-10 MED ORDER — OLANZAPINE 10 MG IM SOLR
10.0000 mg | Freq: Once | INTRAMUSCULAR | Status: AC
  Administered 2020-01-10: 10 mg via INTRAMUSCULAR
  Filled 2020-01-10: qty 10

## 2020-01-10 MED ORDER — DIPHENHYDRAMINE HCL 25 MG PO CAPS: 50.0000 mg | ORAL_CAPSULE | Freq: Once | ORAL | Status: DC

## 2020-01-10 MED ORDER — LORAZEPAM 2 MG PO TABS: 2.0000 mg | ORAL_TABLET | Freq: Once | ORAL | Status: DC

## 2020-01-10 MED ORDER — LORAZEPAM 1 MG PO TABS
1.0000 mg | ORAL_TABLET | ORAL | Status: DC | PRN
  Administered 2020-01-10 – 2020-01-11 (×2): 2 mg via ORAL
  Filled 2020-01-10 (×2): qty 2

## 2020-01-10 MED ORDER — DIAZEPAM 5 MG PO TABS
10.0000 mg | ORAL_TABLET | Freq: Two times a day (BID) | ORAL | Status: DC
  Administered 2020-01-10: 10 mg via ORAL
  Filled 2020-01-10: qty 2

## 2020-01-10 MED ORDER — GABAPENTIN 600 MG PO TABS: 600.0000 mg | ORAL_TABLET | Freq: Three times a day (TID) | ORAL | Status: DC

## 2020-01-10 MED ORDER — THIAMINE HCL 100 MG PO TABS: 100.0000 mg | ORAL_TABLET | Freq: Every day | ORAL | Status: DC

## 2020-01-10 NOTE — Progress Notes (Signed)
Pt transferred to ICU bed 13. Daughter notified.

## 2020-01-10 NOTE — Plan of Care (Signed)
  Problem: Education: Goal: Knowledge of [place] [facility] Education information/materials will improve Outcome: Progressing Goal: Emotional status will improve Outcome: Progressing Goal: Mental status will improve Outcome: Progressing Goal: Verbalization of understanding the information provided will improve Outcome: Progressing   

## 2020-01-10 NOTE — H&P (Signed)
Ivan Hamilton NAME: Ivan Hamilton    MR#:  [id number]  DATE OF BIRTH:  05/20/1966  DATE OF ADMISSION:  01/10/2020  PRIMARY CARE PHYSICIAN: Tracie Harrier, MD   REQUESTING/REFERRING PHYSICIAN: Sharma Covert, MD CHIEF COMPLAINT:  No chief complaint on file. Low blood pressure and rapid heart rate  HISTORY OF PRESENT ILLNESS:  Ivan Hamilton  is a 54 y.o. male with a known history of schizophrenia, hypertension, hypothyroidism, alcohol abuse and vitamin B12 deficiency as well as CVA, who was admitted to a medically monitored bed after having hypotension and tachycardia on the psych floor.  The patient admitted to mild palpitations and dizziness with lightheadedness.  He denied any chest pain or cough or wheezing or hemoptysis.  No reported dysuria, oliguria or hematuria or flank pain.  He was fairly somnolent and therefore a fairly poor historian.  Upon presentation to the floor, vital signs were within normal with improvement of his blood pressure after bolus of IV normal saline.  Labs revealed CMP with an AST of 81 and ALT of 43 and blood glucose of 148 and CBC with mild leukocytosis and mild anemia.  Ammonia level was 33.  COVID-19 PCR came back negative.  The patient was given a bolus of IV normal saline will be placed in observation in a medically monitored bed for further evaluation and management. PAST MEDICAL HISTORY:   Past Medical History:  Diagnosis Date  . Alcohol withdrawal ([place])   . B12 deficiency   . Chronic pain   . CVA (cerebral vascular accident) ([place])   . Hypertension   . Hypothyroidism   . Varicose veins   Schizophrenia  PAST SURGICAL HISTORY:   Past Surgical History:  Procedure Laterality Date  . CHOLECYSTECTOMY    . ESOPHAGOGASTRODUODENOSCOPY (EGD) WITH PROPOFOL N/A 10/24/2017   Procedure: ESOPHAGOGASTRODUODENOSCOPY (EGD) WITH PROPOFOL;  Surgeon: [name], MD;  Location: [facility] ENDOSCOPY;  Service: Endoscopy;   Laterality: N/A;  . ESOPHAGOGASTRODUODENOSCOPY (EGD) WITH PROPOFOL N/A 07/12/2019   Procedure: ESOPHAGOGASTRODUODENOSCOPY (EGD) WITH PROPOFOL;  Surgeon: Jonathon Bellows, MD;  Location: [facility] ENDOSCOPY;  Service: Gastroenterology;  Laterality: N/A;  . LEG SURGERY      SOCIAL HISTORY:   Social History   Tobacco Use  . Smoking status: Never Smoker  . Smokeless tobacco: Never Used  Substance Use Topics  . Alcohol use: Yes    Comment: daily- 1 pint of vodka    FAMILY HISTORY:  Positive hypertension in his mother.  DRUG ALLERGIES:  No Known Allergies  REVIEW OF SYSTEMS:   ROS As per history of present illness. All pertinent systems were reviewed above. Constitutional,  HEENT, cardiovascular, respiratory, GI, GU, musculoskeletal, neuro, psychiatric, endocrine,  integumentary and hematologic systems were reviewed and are otherwise  negative/unremarkable except for positive findings mentioned above in the HPI.   MEDICATIONS AT HOME:   Prior to Admission medications   Medication Sig Start Date End Date Taking? Authorizing Provider  busPIRone (BUSPAR) 15 MG tablet Take 1 tablet (15 mg total) by mouth 2 (two) times daily. Patient not taking: Reported on 01/08/2020 09/24/18   Lavella Hammock, MD  FLUoxetine (PROZAC) 40 MG capsule Take 1 capsule (40 mg total) by mouth daily. Patient not taking: Reported on 01/08/2020 09/24/18   Lavella Hammock, MD  levothyroxine Wilmer Floor, LEVOTHROID) 125 MCG tablet Take 1 tablet (125 mcg total) by mouth daily before breakfast. 09/24/18   Lavella Hammock, MD  VITAL SIGNS:  There were no vitals taken for this visit.  PHYSICAL EXAMINATION:  Physical Exam  GENERAL:  54 y.o.-year-old Hispanic male patient lying in the bed with no acute distress.  EYES: Pupils equal, round, reactive to light and accommodation. No scleral icterus. Extraocular muscles intact.  HEENT: Head atraumatic, normocephalic. Oropharynx and nasopharynx clear.  NECK:  Supple, no  jugular venous distention. No thyroid enlargement, no tenderness.  LUNGS: Normal breath sounds bilaterally, no wheezing, rales,rhonchi or crepitation. No use of accessory muscles of respiration.  CARDIOVASCULAR: Regular rate and rhythm, S1, S2 normal. No murmurs, rubs, or gallops.  ABDOMEN: Soft, nondistended, nontender. Bowel sounds present. No organomegaly or mass.  EXTREMITIES: No pedal edema, cyanosis, or clubbing.  NEUROLOGIC: Cranial nerves II through XII are intact. Muscle strength 5/5 in all extremities. Sensation intact. Gait not checked.  PSYCHIATRIC: The patient is alert and oriented x 3.  Normal affect and good eye contact. SKIN: He has a couple abrasions on his nose as well as an abrasion on his left forehead and lateral left periorbital area.  LABORATORY PANEL:   CBC Recent Labs  Lab 01/07/20 2037  WBC 11.0*  HGB 12.6*  HCT 37.3*  PLT 217   ------------------------------------------------------------------------------------------------------------------  Chemistries  Recent Labs  Lab 01/07/20 2037  NA 139  K 3.7  CL 102  CO2 22  GLUCOSE 148*  BUN 8  CREATININE 0.70  CALCIUM 8.7*  AST 81*  ALT 43  ALKPHOS 91  BILITOT 1.4*   ------------------------------------------------------------------------------------------------------------------  Cardiac Enzymes No results for input(s): TROPONINI in the last 168 hours. ------------------------------------------------------------------------------------------------------------------  RADIOLOGY:  No results found.    IMPRESSION AND PLAN:   1.  Hypotension, likely secondary to volume depletion and dehydration. -The patient will be placed in observation in a medically monitored bed. -We will hydrate with IV normal saline. -We will obtain orthostatics every 12 hours.  2.  Suspected early alcohol withdrawal. -We will place the patient on as needed IV Ativan and a banana bag daily.  3.  Hypothyroidism. -We  will continue Synthroid and check TSH level.  4.  Depression and anxiety. -We will continue Prozac and BuSpar.  5.  DVT prophylaxis. -Subcutaneous Lovenox    All the records are reviewed and case discussed with ED provider. The plan of care was discussed in details with the patient (and family). I answered all questions. The patient agreed to proceed with the above mentioned plan. Further management will depend upon hospital course.   CODE STATUS: Full code  Status is: Observation  The patient remains OBS appropriate and will d/c before 2 midnights.  Dispo: The patient is from: SNF              Anticipated d/c is to: SNF              Anticipated d/c date is: 1 day              Patient currently is not medically stable to d/c.   TOTAL TIME TAKING CARE OF THIS PATIENT: 50 minutes.    Christel Mormon M.D on 01/10/2020 at 3:58 AM  [facility]   From 7 PM-7 AM, contact night-coverage [email or web address]  CC: Primary care physician; Tracie Harrier, MD   Note: This dictation was prepared with Dragon dictation along with smaller phrase technology. Any transcriptional typo errors that result from this process are unintentional.

## 2020-01-10 NOTE — Progress Notes (Addendum)
At 2330 patient started to wander about the unit, restless and looking for his family that he insisted were here. At beginning of the shift he told me he heard voices and was seeing things that he knew were not there. As he became increasingly agitated he was medicated with Ativan per prn order. At around 0100 he was wandering the hall and went down on one knee and started to lie on his stomach and was shaking. He was picking at imaginary things on the floor. He became increasingly incoherent, shifting between [place] and [place]. With assistance from video interpreter, patient said he always hallucinates whether he is drinking or not and that he always has seizures when he stops drinking. He was difficult for the interpreter to understand at times in either [place] or Spanish due to patient's becoming increasingly incoherent. Rapid response called and patient being transferred to [facility]. Dr. Mallie Darting notified when NP Doren Custard was unable to be reached in the ED and he ordered IM's which were held because patient was hypotensive. Difficult to obtain vital signs due to restlessness and agitation. Patient's hs dose of prazosin held because patient's blood pressure was low at that time. Hospitalist ordered for IM's ordered by Dr. Mallie Darting to be held until patient['s blood pressure stabilizes.

## 2020-01-10 NOTE — Progress Notes (Signed)
Brief hospitalist update note.  This is a nonbillable note.  Please see same-day H&P for full billable details.\  Briefly, this is a 54 year old male, primarily Spanish-speaking, with known history of schizophrenia, hypothyroidism, hypertension, alcohol abuse, history of stroke who was initially admitted to the inpatient behavioral health unit after a self-reported suicide attempt.  The exact details are somewhat unclear.  The patient is unable to provide history to this effect.  However once the patient was accepted to the behavioral unit he was noted to have hypotension and rapid heart rate and was subsequently transferred and admitted to the internal medicine service.  On our service his blood pressure is improved.  His heart rate is on the high side but not tachycardic by definition.  He is experiencing some clinical indications of acute alcohol withdrawal.  Per his report he said his last drink was 3 days prior to admission.  He has been scoring on CIWA.  On my evaluation he is comfortable however is notably confabulating and speaking nonsensically.  History is conducted in [place].   Plan: Continue IV fluids Continue CIWA protocol with Ativan Add scheduled diazepam 10 mg p.o. twice daily in order to avoid use of IV narcotic Can consider Librium if needed If patient severely deteriorates can also consider transfer to stepdown unit and initiation of Precedex gtt.   Ralene Muskrat MD

## 2020-01-10 NOTE — Significant Event (Signed)
Patient was evaluated several times throughout the day and found to have rapidly worsening signs of acute alcohol withdrawal.  He was becoming less responsive, more impulsive, pulling at lines, try to get out of bed, becoming more obviously tremulous.  He was confabulating earlier this morning however his mental status is worsening.  On review of records the patient has a history of alcohol withdrawal on previous admissions requiring admission to intensive care unit for initiation of Precedex infusion.  At this time considering the patient's history of severe alcoholism and prior withdrawals we will move to stepdown unit  Initiate Precedex gtt. Continue CIWA protocol Continue IV fluids Continue high-dose IV thiamine Continuous cardiac monitoring  Ralene Muskrat MD

## 2020-01-10 NOTE — Progress Notes (Signed)
Pt arrived to unit at this time. Transferred self to ICU bed. Pt is very confused and attempting to pull out IV. Precedex gtt started. VSS on room air. Pt has abrasions on face from fall at home.

## 2020-01-10 NOTE — TOC Progression Note (Signed)
Transition of Care [facility]) - Progression Note    Patient Details  Name: Kue Fox MRN: [id number] Date of Birth: 01/07/1966  [facility] of [facility]) CM/SW Contact  Meriel Flavors, LCSW Phone Number: 01/10/2020, 12:25 PM  Clinical Narrative:    6/20:Doctor requesting SA consult. Patient speaks spanish, I acknowledged the consult and ask the doctor if he was okay with waiting until Monday? If so Maurice Small is likely to be more effective in attempting this conversation. Doctor agreed. I will send her an email with this information.         Expected Discharge Plan and Services                                                 Social Determinants of Health (SDOH) Interventions    Readmission Risk Interventions No flowsheet data found.

## 2020-01-11 LAB — CBC WITH DIFFERENTIAL/PLATELET
Abs Immature Granulocytes: 0.01 10*3/uL (ref 0.00–0.07)
Basophils Absolute: 0 10*3/uL (ref 0.0–0.1)
Basophils Relative: 0 %
Eosinophils Absolute: 0.1 10*3/uL (ref 0.0–0.5)
Eosinophils Relative: 4 %
HCT: 32 % — ABNORMAL LOW (ref 39.0–52.0)
Hemoglobin: 10.8 g/dL — ABNORMAL LOW (ref 13.0–17.0)
Immature Granulocytes: 0 %
Lymphocytes Relative: 22 %
Lymphs Abs: 0.7 10*3/uL (ref 0.7–4.0)
MCH: 26.5 pg (ref 26.0–34.0)
MCHC: 33.8 g/dL (ref 30.0–36.0)
MCV: 78.6 fL — ABNORMAL LOW (ref 80.0–100.0)
Monocytes Absolute: 0.2 10*3/uL (ref 0.1–1.0)
Monocytes Relative: 7 %
Neutro Abs: 2.2 10*3/uL (ref 1.7–7.7)
Neutrophils Relative %: 67 %
Platelets: 83 10*3/uL — ABNORMAL LOW (ref 150–400)
RBC: 4.07 MIL/uL — ABNORMAL LOW (ref 4.22–5.81)
RDW: 19.5 % — ABNORMAL HIGH (ref 11.5–15.5)
WBC: 3.3 10*3/uL — ABNORMAL LOW (ref 4.0–10.5)
nRBC: 0 % (ref 0.0–0.2)

## 2020-01-11 LAB — COMPREHENSIVE METABOLIC PANEL
ALT: 62 U/L — ABNORMAL HIGH (ref 0–44)
AST: 123 U/L — ABNORMAL HIGH (ref 15–41)
Albumin: 3.4 g/dL — ABNORMAL LOW (ref 3.5–5.0)
Alkaline Phosphatase: 70 U/L (ref 38–126)
Anion gap: 7 (ref 5–15)
BUN: <5 mg/dL — ABNORMAL LOW (ref 6–20)
CO2: 22 mmol/L (ref 22–32)
Calcium: 8.7 mg/dL — ABNORMAL LOW (ref 8.9–10.3)
Chloride: 108 mmol/L (ref 98–111)
Creatinine, Ser: 0.61 mg/dL (ref 0.61–1.24)
GFR calc Af Amer: >60 mL/min (ref >60)
GFR calc non Af Amer: >60 mL/min (ref >60)
Glucose, Bld: 102 mg/dL — ABNORMAL HIGH (ref 70–99)
Potassium: 3.7 mmol/L (ref 3.5–5.1)
Sodium: 137 mmol/L (ref 135–145)
Total Bilirubin: 1.6 mg/dL — ABNORMAL HIGH (ref 0.3–1.2)
Total Protein: 7.1 g/dL (ref 6.5–8.1)

## 2020-01-11 NOTE — Progress Notes (Signed)
PROGRESS NOTE    Ivan Hamilton  BOF:[id number] DOB: [birth year] DOA: 01/10/2020 PCP: Tracie Harrier, MD  Brief Narrative: 54 year old male, primarily Spanish-speaking, with known history of schizophrenia, hypothyroidism, hypertension, alcohol abuse, history of stroke who was initially admitted to the inpatient behavioral health unit after a self-reported suicide attempt.  The exact details are somewhat unclear.  The patient is unable to provide history to this effect.  However once the patient was accepted to the behavioral unit he was noted to have hypotension and rapid heart rate and was subsequently transferred and admitted to the internal medicine service.  On our service his blood pressure is improved.  His heart rate is on the high side but not tachycardic by definition.  He is experiencing some clinical indications of acute alcohol withdrawal.  Per his report he said his last drink was 3 days prior to admission.  He has been scoring on CIWA.  On my evaluation he is comfortable however is notably confabulating and speaking nonsensically.  History is conducted in [place].  6/20: Patient transition to stepdown unit for initiation of Precedex gtt.  6/21: Patient seen and evaluated this morning.  Mental status appears to be improving.  Precedex gtt. titrated off.  Patient receiving oral Ativan.  Stable for transfer to medical floor if he remains stable off Precedex infusion    Assessment & Plan:   Active Problems:   Alcohol withdrawal (HCC)   Hypotension  Acute alcohol withdrawal Patient required transfer to stepdown unit for initiation of Precedex Improved substantially over interval Making since this morning Plan: Continue CIWA protocol with Ativan Continue IV fluids Continue high-dose thiamine And patient remained stable off Precedex gtt. can transition to [place] unit later today or tomorrow  Hypothyroidism Continue Synthroid  Depression/anxiety Continue BuSpar and Prozac   Possible suicide attempt/ideation Unclear where exactly we are this work-up We will stabilize medically then I will reconsult psychiatry     DVT prophylaxis: Lovenox Code Status: Full Family Communication: None today Disposition Plan: Status is: Inpatient  Remains inpatient appropriate because:Inpatient level of care appropriate due to severity of illness   Dispo: The patient is from: Home              Anticipated d/c is to: Home              Anticipated d/c date is: 2 days              Patient currently is not medically stable to d/c.         Consultants:   none  Procedures:   none  Antimicrobials:   none    Subjective: Seen and examined.  Mental status improved.  Off Precedex infusion.  Objective: Vitals:   01/11/20 0700 01/11/20 0800 01/11/20 0900 01/11/20 1000  BP: (!) 109/97 126/81 (!) 145/122 (!) 81/59  Pulse: 69 66 74 68  Resp: 20 18 (!) 21 20  Temp:  97.6 F (36.4 C)    TempSrc:  Oral    SpO2: 98% 97% 96% 97%  Height:        Intake/Output Summary (Last 24 hours) at 01/11/2020 1423 Last data filed at 01/11/2020 0900 Gross per 24 hour  Intake 3219.44 ml  Output 1650 ml  Net 1569.44 ml   There were no vitals filed for this visit.  Examination:  General exam: Appears calm and comfortable  Respiratory system: Clear to auscultation. Respiratory effort normal. Cardiovascular system: S1 & S2 heard, RRR. No JVD, murmurs, rubs, gallops or clicks.  No pedal edema. Gastrointestinal system: Abdomen is nondistended, soft and nontender. No organomegaly or masses felt. Normal bowel sounds heard. Central nervous system: Alert and oriented. No focal neurological deficits. Extremities: Symmetric 5 x 5 power. Skin: No rashes, lesions or ulcers Psychiatry: Judgement and insight appear normal. Mood & affect appropriate.     Data Reviewed: I have personally reviewed following labs and imaging studies  CBC: Recent Labs  Lab 01/07/20 2037 01/10/20  0613 01/11/20 0745  WBC 11.0* 4.3 3.3*  NEUTROABS  --  3.1 2.2  HGB 12.6* 10.4* 10.8*  HCT 37.3* 30.6* 32.0*  MCV 77.9* 78.1* 78.6*  PLT 217 PLATELET CLUMPS NOTED ON SMEAR, UNABLE TO ESTIMATE 83*   Basic Metabolic Panel: Recent Labs  Lab 01/07/20 2037 01/10/20 0613 01/11/20 0745  NA 139 130* 137  K 3.7 3.3* 3.7  CL 102 95* 108  CO2 22 24 22   GLUCOSE 148* 138* 102*  BUN 8 5* <5*  CREATININE 0.70 0.66 0.61  CALCIUM 8.7* 9.3 8.7*  MG  --  1.4*  --   PHOS  --  2.8  --    GFR: Estimated Creatinine Clearance: 90.8 mL/min (by C-G formula based on SCr of 0.61 mg/dL). Liver Function Tests: Recent Labs  Lab 01/07/20 2037 01/10/20 0613 01/11/20 0745  AST 81* 70* 123*  ALT 43 42 62*  ALKPHOS 91 76 70  BILITOT 1.4* 2.1* 1.6*  PROT 9.3* 7.4 7.1  ALBUMIN 4.3 3.6 3.4*   No results for input(s): LIPASE, AMYLASE in the last 168 hours. Recent Labs  Lab 01/09/20 1057  AMMONIA 33   Coagulation Profile: Recent Labs  Lab 01/09/20 1057 01/10/20 0613  INR 1.2 1.3*   Cardiac Enzymes: No results for input(s): CKTOTAL, CKMB, CKMBINDEX, TROPONINI in the last 168 hours. BNP (last 3 results) No results for input(s): PROBNP in the last 8760 hours. HbA1C: No results for input(s): HGBA1C in the last 72 hours. CBG: Recent Labs  Lab 01/10/20 1601  GLUCAP 109*   Lipid Profile: No results for input(s): CHOL, HDL, LDLCALC, TRIG, CHOLHDL, LDLDIRECT in the last 72 hours. Thyroid Function Tests: Recent Labs    01/10/20 0613  TSH 5.691*   Anemia Panel: No results for input(s): VITAMINB12, FOLATE, FERRITIN, TIBC, IRON, RETICCTPCT in the last 72 hours. Sepsis Labs: No results for input(s): PROCALCITON, LATICACIDVEN in the last 168 hours.  Recent Results (from the past 240 hour(s))  SARS Coronavirus 2 by RT PCR (hospital order, performed in [facility] hospital lab) Nasopharyngeal Nasopharyngeal Swab     Status: None   Collection Time: 01/08/20 12:55 PM   Specimen: Nasopharyngeal  Swab  Result Value Ref Range Status   SARS Coronavirus 2 NEGATIVE NEGATIVE Final    Comment: (NOTE) SARS-CoV-2 target nucleic acids are NOT DETECTED.  The SARS-CoV-2 RNA is generally detectable in upper and lower respiratory specimens during the acute phase of infection. The lowest concentration of SARS-CoV-2 viral copies this assay can detect is 250 copies / mL. A negative result does not preclude SARS-CoV-2 infection and should not be used as the sole basis for treatment or other patient management decisions.  A negative result may occur with improper specimen collection / handling, submission of specimen other than nasopharyngeal swab, presence of viral mutation(s) within the areas targeted by this assay, and inadequate number of viral copies (<250 copies / mL). A negative result must be combined with clinical observations, patient history, and epidemiological information.  Fact Sheet for Patients:   StrictlyIdeas.no  Fact  Sheet for Healthcare Providers: BankingDealers.co.za  This test is not yet approved or  cleared by the [place] and has been authorized for detection and/or diagnosis of SARS-CoV-2 by FDA under an Emergency Use Authorization (EUA).  This EUA will remain in effect (meaning this test can be used) for the duration of the COVID-19 declaration under Section 564(b)(1) of the Act, 21 U.S.C. section 360bbb-3(b)(1), unless the authorization is terminated or revoked sooner.  Performed at [facility], [address] [place], [place] [address]   MRSA PCR Screening     Status: Abnormal   Collection Time: 01/10/20  4:02 PM   Specimen: Nasal Mucosa; Nasopharyngeal  Result Value Ref Range Status   MRSA by PCR POSITIVE (A) NEGATIVE Final    Comment:        The GeneXpert MRSA Assay (FDA approved for NASAL specimens only), is one component of a comprehensive MRSA colonization surveillance program. It is  not intended to diagnose MRSA infection nor to guide or monitor treatment for MRSA infections. RESULT CALLED TO, READ BACK BY AND VERIFIED WITH: [place] @1729  01/10/20 MJU Performed at [facility], [address], [place], [place] [address]          Radiology Studies: CT HEAD WO CONTRAST  Result Date: 01/10/2020 CLINICAL DATA:  Initial evaluation for acute encephalopathy, agitation, hallucinations. EXAM: CT HEAD WITHOUT CONTRAST TECHNIQUE: Contiguous axial images were obtained from the base of the skull through the vertex without intravenous contrast. COMPARISON:  Prior head CT from 01/08/2020. FINDINGS: Brain: Generalized age-related cerebral atrophy with chronic microvascular ischemic disease. No acute intracranial hemorrhage. No acute large vessel territory infarct. No mass lesion, midline shift or mass effect. No hydrocephalus or extra-axial fluid collection. Vascular: No hyperdense vessel. Scattered vascular calcifications noted within the carotid siphons. Skull: Scalp soft tissues within normal limits.  Calvarium intact. Sinuses/Orbits: Globes and orbital soft tissues within normal limits. Paranasal sinuses remain largely clear. No mastoid effusion. Other: None. IMPRESSION: 1. Stable head CT.  No acute intracranial abnormality identified. 2. Mild age-related cerebral atrophy with chronic small vessel ischemic disease. Electronically Signed   By: Jeannine Boga M.D.   On: 01/10/2020 07:24        Scheduled Meds: . busPIRone  15 mg Oral BID  . Chlorhexidine Gluconate Cloth  6 each Topical Daily  . FLUoxetine  40 mg Oral Daily  . folic acid  1 mg Oral Daily  . multivitamin with minerals  1 tablet Oral Daily   Continuous Infusions: . sodium chloride 100 mL/hr at 01/11/20 0900  . dexmedetomidine (PRECEDEX) IV infusion 0.6 mcg/kg/hr (01/11/20 0600)  . dexmedetomidine (PRECEDEX) IV infusion Stopped (01/11/20 0857)  . thiamine injection 500 mg (01/11/20 1355)      LOS: 1 day    Time spent: 35 minutes    Sidney Ace, MD [facility] Pager [phone]  If 7PM-7AM, please contact night-coverage 01/11/2020, 2:23 PM

## 2020-01-12 LAB — COMPREHENSIVE METABOLIC PANEL
ALT: 55 U/L — ABNORMAL HIGH (ref 0–44)
AST: 88 U/L — ABNORMAL HIGH (ref 15–41)
Albumin: 3.4 g/dL — ABNORMAL LOW (ref 3.5–5.0)
Alkaline Phosphatase: 72 U/L (ref 38–126)
Anion gap: 7 (ref 5–15)
BUN: 8 mg/dL (ref 6–20)
CO2: 24 mmol/L (ref 22–32)
Calcium: 8.7 mg/dL — ABNORMAL LOW (ref 8.9–10.3)
Chloride: 106 mmol/L (ref 98–111)
Creatinine, Ser: 0.64 mg/dL (ref 0.61–1.24)
GFR calc Af Amer: >60 mL/min (ref >60)
GFR calc non Af Amer: >60 mL/min (ref >60)
Glucose, Bld: 87 mg/dL (ref 70–99)
Potassium: 3.3 mmol/L — ABNORMAL LOW (ref 3.5–5.1)
Sodium: 137 mmol/L (ref 135–145)
Total Bilirubin: 1.4 mg/dL — ABNORMAL HIGH (ref 0.3–1.2)
Total Protein: 7.3 g/dL (ref 6.5–8.1)

## 2020-01-12 LAB — CBC WITH DIFFERENTIAL/PLATELET
Abs Immature Granulocytes: 0.01 10*3/uL (ref 0.00–0.07)
Basophils Absolute: 0 10*3/uL (ref 0.0–0.1)
Basophils Relative: 1 %
Eosinophils Absolute: 0.1 10*3/uL (ref 0.0–0.5)
Eosinophils Relative: 3 %
HCT: 31.6 % — ABNORMAL LOW (ref 39.0–52.0)
Hemoglobin: 11.1 g/dL — ABNORMAL LOW (ref 13.0–17.0)
Immature Granulocytes: 0 %
Lymphocytes Relative: 30 %
Lymphs Abs: 1.5 10*3/uL (ref 0.7–4.0)
MCH: 27 pg (ref 26.0–34.0)
MCHC: 35.1 g/dL (ref 30.0–36.0)
MCV: 76.9 fL — ABNORMAL LOW (ref 80.0–100.0)
Monocytes Absolute: 0.4 10*3/uL (ref 0.1–1.0)
Monocytes Relative: 8 %
Neutro Abs: 3.1 10*3/uL (ref 1.7–7.7)
Neutrophils Relative %: 58 %
Platelets: 97 10*3/uL — ABNORMAL LOW (ref 150–400)
RBC: 4.11 MIL/uL — ABNORMAL LOW (ref 4.22–5.81)
RDW: 19.4 % — ABNORMAL HIGH (ref 11.5–15.5)
WBC: 5.2 10*3/uL (ref 4.0–10.5)
nRBC: 0 % (ref 0.0–0.2)

## 2020-01-12 MED ORDER — POTASSIUM CHLORIDE CRYS ER 20 MEQ PO TBCR
40.0000 meq | EXTENDED_RELEASE_TABLET | Freq: Once | ORAL | Status: AC
  Administered 2020-01-12: 40 meq via ORAL
  Filled 2020-01-12: qty 2

## 2020-01-12 MED ORDER — ACETAMINOPHEN 325 MG PO TABS
650.0000 mg | ORAL_TABLET | Freq: Four times a day (QID) | ORAL | Status: DC | PRN
  Administered 2020-01-12 – 2020-01-13 (×2): 650 mg via ORAL
  Filled 2020-01-12 (×2): qty 2

## 2020-01-12 MED ORDER — TRAZODONE HCL 50 MG PO TABS
50.0000 mg | ORAL_TABLET | Freq: Every evening | ORAL | Status: DC | PRN
  Administered 2020-01-12 – 2020-01-13 (×2): 50 mg via ORAL
  Filled 2020-01-12 (×2): qty 1

## 2020-01-12 MED ORDER — LEVOTHYROXINE SODIUM 125 MCG PO TABS
125.0000 ug | ORAL_TABLET | Freq: Every day | ORAL | Status: DC
  Administered 2020-01-12 – 2020-01-14 (×3): 125 ug via ORAL
  Filled 2020-01-12 (×4): qty 1

## 2020-01-12 MED ORDER — MAGNESIUM SULFATE 2 GM/50ML IV SOLN
2.0000 g | Freq: Once | INTRAVENOUS | Status: AC
  Administered 2020-01-12: 2 g via INTRAVENOUS
  Filled 2020-01-12: qty 50

## 2020-01-12 NOTE — TOC Initial Note (Addendum)
Transition of Care [facility]) - Initial/Assessment Note    Patient Details  Name: Ivan Hamilton MRN: [id number] Date of Birth: 1965-12-14  Transition of Care [facility]) CM/SW Contact:    Ivan Ammons, RN Phone Number: 01/12/2020, 2:49 PM  Clinical Narrative:   RNCM received phone call from patient's parole officer Ivan Hamilton who wasn't requesting any information but did want to give his contact information and let care team know that patient has court order for alcohol treatment but they have been unable to find placement due to his insurance. His contact information is [phone] and his office phone is [phone]. He did request that he be notified when patient is discharged from the hospital.   RNCM then received phone call from patient's wife Ivan Hamilton and at that time assessment was complete.  Patient has had treatment in the past at [facility] in the past and wife would like for care team to attempt to find placement for him again. She reports that patient's drinking has gotten worse and worse. She reports that he was supposed to get treatment at [facility] but they couldn't take him because of his insurance.   RNCM notified care team of above information.                   Patient Goals and CMS Choice        Expected Discharge Plan and Services                                                Prior Living Arrangements/Services                       Activities of Daily Living Home Assistive Devices/Equipment: None ADL Screening (condition at time of admission) Patient's cognitive ability adequate to safely complete daily activities?: Yes Is the patient deaf or have difficulty hearing?: No Does the patient have difficulty seeing, even when wearing glasses/contacts?: No Does the patient have difficulty concentrating, remembering, or making decisions?: No Patient able to express need for assistance with ADLs?: Yes Does the patient have difficulty dressing or  bathing?: No Independently performs ADLs?: Yes (appropriate for developmental age) Does the patient have difficulty walking or climbing stairs?: No Weakness of Legs: None Weakness of Arms/Hands: None  Permission Sought/Granted                  Emotional Assessment              Admission diagnosis:  Hypotension [I95.9] Alcohol withdrawal ([place]) [F10.239] Patient Active Problem List   Diagnosis Date Noted  . Hypotension 01/10/2020  . Bipolar disorder current episode depressed ([place]) 01/08/2020  . Metabolic acidosis   . Hepatic cirrhosis ([place])   . Diverticulitis   . Transaminitis   . Syncope 07/11/2019  . Abdominal pain 07/11/2019  . Alcohol abuse 07/11/2019  . Thrombocytopenia ([place]) 07/11/2019  . Normocytic anemia 07/11/2019  . Tobacco abuse 07/11/2019  . HTN (hypertension) 07/11/2019  . CVA (cerebral vascular accident) ([place])   . Suicidal behavior 08/28/2018  . Severe recurrent major depressive disorder with psychotic features ([place]) 08/28/2018  . Alcohol intoxication ([place]) 08/28/2018  . Suicidal ideation 08/28/2018  . Duodenitis   . Gastritis without bleeding   . Hypokalemia 10/22/2017  . Intractable vomiting with nausea   . Depression 04/10/2016  . Alcohol withdrawal delirium ([place])   .  Alcohol withdrawal ([place]) 09/02/2015  . Alcohol use disorder, severe, dependence ([place]) 09/02/2015  . 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 PTSD (post-traumatic stress disorder) 09/01/2015  . Hypothyroid 09/01/2015  . Gastric reflux 09/01/2015  . DDD (degenerative disc disease), lumbosacral 09/01/2015  . 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 Vitamin B12 deficiency 08/17/2015  . Pain of lumbar spine 05/19/2015  . 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 Varicose vein of leg 04/25/2015  . Abnormal LFTs (liver function tests) 03/31/2015  . Confusion state 03/31/2015   . Chronic pain of left lower extremity 03/31/2015   PCP:  Ivan Harrier, MD Pharmacy:   [id number] [address], [place] [address] Phone: [phone] Fax: [phone]     Social Determinants of Health (SDOH) Interventions    Readmission Risk Interventions No flowsheet data found.

## 2020-01-12 NOTE — Discharge Summary (Signed)
Patient transferred to the med surge floor for fluids.

## 2020-01-12 NOTE — Progress Notes (Signed)
PROGRESS NOTE    Ivan Hamilton  YIR:[id number] DOB: 1966/03/12 DOA: 01/10/2020 PCP: Tracie Harrier, MD  Brief Narrative: 54 year old male, primarily Spanish-speaking, with known history of schizophrenia, hypothyroidism, hypertension, alcohol abuse, history of stroke who was initially admitted to the inpatient behavioral health unit after a self-reported suicide attempt.  The exact details are somewhat unclear.  The patient is unable to provide history to this effect.  However once the patient was accepted to the behavioral unit he was noted to have hypotension and rapid heart rate and was subsequently transferred and admitted to the internal medicine service.  On our service his blood pressure is improved.  His heart rate is on the high side but not tachycardic by definition.  He is experiencing some clinical indications of acute alcohol withdrawal.  Per his report he said his last drink was 3 days prior to admission.  He has been scoring on CIWA.  On my evaluation he is comfortable however is notably confabulating and speaking nonsensically.  History is conducted in [place].  6/20: Patient transition to stepdown unit for initiation of Precedex gtt.  6/21: Patient seen and evaluated this morning.  Mental status appears to be improving.  Precedex gtt. titrated off.  Patient receiving oral Ativan.  Stable for transfer to medical floor if he remains stable off Precedex infusion  6/22: Patient seen and evaluated.  Withdrawal has essentially resolved at this point.  Patient is mentating clearly.    Assessment & Plan:   Active Problems:   Alcohol withdrawal (HCC)   Hypotension  Acute alcohol withdrawal Patient required transfer to stepdown unit for initiation of Precedex Improved substantially over interval Mentating clearly this morning Slightly tremulous but no other clinical indicators of withdrawal Plan: Continue CIWA with p.o. Ativan only Continue high-dose IV thiamine for now Can  DC IV fluids Stable for disposition to inpatient psych Reconsulted Dr. Janese Banks from psychiatry  Hypothyroidism Continue Synthroid  Depression/anxiety Continue BuSpar and Prozac  Possible suicide attempt/ideation Unclear where exactly we are this work-up Patient is currently medically stabilized He can disposition inpatient psych if deemed appropriate I have reached out to Dr. Janese Banks from psychiatry, recommendations appreciated     DVT prophylaxis: Lovenox Code Status: Full Family Communication: None today Disposition Plan: Status is: Inpatient  Remains inpatient appropriate because:Inpatient level of care appropriate due to severity of illness   Dispo: The patient is from: Home              Anticipated d/c is to: Home              Anticipated d/c date is: 2 days              Patient currently is not medically stable to d/c.  Currently on IVC awaiting reevaluation by psychiatry   Consultants:   Psychiatry  Procedures:   none  Antimicrobials:   none    Subjective: Seen and examined.  Mental status improved.  Off Precedex infusion.  Objective: Vitals:   01/11/20 2100 01/11/20 2213 01/12/20 0541 01/12/20 0758  BP: 123/83 128/85 125/86 139/89  Pulse: (!) 101 (!) 101 88 89  Resp: (!) 22 20 16 17   Temp:  99.5 F (37.5 C) 98.5 F (36.9 C) 98.3 F (36.8 C)  TempSrc:  Oral Oral Oral  SpO2: 97% 99% 97% 96%  Height:        Intake/Output Summary (Last 24 hours) at 01/12/2020 1200 Last data filed at 01/12/2020 1032 Gross per 24 hour  Intake 1088.2 ml  Output 3300 ml  Net -2211.8 ml   There were no vitals filed for this visit.  Examination:  General exam: Appears calm and comfortable  Respiratory system: Clear to auscultation. Respiratory effort normal. Cardiovascular system: S1 & S2 heard, RRR. No JVD, murmurs, rubs, gallops or clicks. No pedal edema. Gastrointestinal system: Abdomen is nondistended, soft and nontender. No organomegaly or masses felt. Normal  bowel sounds heard. Central nervous system: Alert and oriented. No focal neurological deficits. Extremities: Symmetric 5 x 5 power. Skin: No rashes, lesions or ulcers Psychiatry: Judgement and insight appear normal. Mood & affect appropriate.     Data Reviewed: I have personally reviewed following labs and imaging studies  CBC: Recent Labs  Lab 01/07/20 2037 01/10/20 0613 01/11/20 0745 01/12/20 0538  WBC 11.0* 4.3 3.3* 5.2  NEUTROABS  --  3.1 2.2 3.1  HGB 12.6* 10.4* 10.8* 11.1*  HCT 37.3* 30.6* 32.0* 31.6*  MCV 77.9* 78.1* 78.6* 76.9*  PLT 217 PLATELET CLUMPS NOTED ON SMEAR, UNABLE TO ESTIMATE 83* 97*   Basic Metabolic Panel: Recent Labs  Lab 01/07/20 2037 01/10/20 0613 01/11/20 0745 01/12/20 0538  NA 139 130* 137 137  K 3.7 3.3* 3.7 3.3*  CL 102 95* 108 106  CO2 22 24 22 24   GLUCOSE 148* 138* 102* 87  BUN 8 5* <5* 8  CREATININE 0.70 0.66 0.61 0.64  CALCIUM 8.7* 9.3 8.7* 8.7*  MG  --  1.4*  --   --   PHOS  --  2.8  --   --    GFR: Estimated Creatinine Clearance: 90.8 mL/min (by C-G formula based on SCr of 0.64 mg/dL). Liver Function Tests: Recent Labs  Lab 01/07/20 2037 01/10/20 0613 01/11/20 0745 01/12/20 0538  AST 81* 70* 123* 88*  ALT 43 42 62* 55*  ALKPHOS 91 76 70 72  BILITOT 1.4* 2.1* 1.6* 1.4*  PROT 9.3* 7.4 7.1 7.3  ALBUMIN 4.3 3.6 3.4* 3.4*   No results for input(s): LIPASE, AMYLASE in the last 168 hours. Recent Labs  Lab 01/09/20 1057  AMMONIA 33   Coagulation Profile: Recent Labs  Lab 01/09/20 1057 01/10/20 0613  INR 1.2 1.3*   Cardiac Enzymes: No results for input(s): CKTOTAL, CKMB, CKMBINDEX, TROPONINI in the last 168 hours. BNP (last 3 results) No results for input(s): PROBNP in the last 8760 hours. HbA1C: No results for input(s): HGBA1C in the last 72 hours. CBG: Recent Labs  Lab 01/10/20 1601  GLUCAP 109*   Lipid Profile: No results for input(s): CHOL, HDL, LDLCALC, TRIG, CHOLHDL, LDLDIRECT in the last 72  hours. Thyroid Function Tests: Recent Labs    01/10/20 0613  TSH 5.691*   Anemia Panel: No results for input(s): VITAMINB12, FOLATE, FERRITIN, TIBC, IRON, RETICCTPCT in the last 72 hours. Sepsis Labs: No results for input(s): PROCALCITON, LATICACIDVEN in the last 168 hours.  Recent Results (from the past 240 hour(s))  SARS Coronavirus 2 by RT PCR (hospital order, performed in [facility] hospital lab) Nasopharyngeal Nasopharyngeal Swab     Status: None   Collection Time: 01/08/20 12:55 PM   Specimen: Nasopharyngeal Swab  Result Value Ref Range Status   SARS Coronavirus 2 NEGATIVE NEGATIVE Final    Comment: (NOTE) SARS-CoV-2 target nucleic acids are NOT DETECTED.  The SARS-CoV-2 RNA is generally detectable in upper and lower respiratory specimens during the acute phase of infection. The lowest concentration of SARS-CoV-2 viral copies this assay can detect is 250 copies / mL. A negative result does not preclude SARS-CoV-2  infection and should not be used as the sole basis for treatment or other patient management decisions.  A negative result may occur with improper specimen collection / handling, submission of specimen other than nasopharyngeal swab, presence of viral mutation(s) within the areas targeted by this assay, and inadequate number of viral copies (<250 copies / mL). A negative result must be combined with clinical observations, patient history, and epidemiological information.  Fact Sheet for Patients:   StrictlyIdeas.no  Fact Sheet for Healthcare Providers: BankingDealers.co.za  This test is not yet approved or  cleared by the [place] FDA and has been authorized for detection and/or diagnosis of SARS-CoV-2 by FDA under an Emergency Use Authorization (EUA).  This EUA will remain in effect (meaning this test can be used) for the duration of the COVID-19 declaration under Section 564(b)(1) of the Act, 21  U.S.C. section 360bbb-3(b)(1), unless the authorization is terminated or revoked sooner.  Performed at [facility], [address] [place], [place] [address]   MRSA PCR Screening     Status: Abnormal   Collection Time: 01/10/20  4:02 PM   Specimen: Nasal Mucosa; Nasopharyngeal  Result Value Ref Range Status   MRSA by PCR POSITIVE (A) NEGATIVE Final    Comment:        The GeneXpert MRSA Assay (FDA approved for NASAL specimens only), is one component of a comprehensive MRSA colonization surveillance program. It is not intended to diagnose MRSA infection nor to guide or monitor treatment for MRSA infections. RESULT CALLED TO, READ BACK BY AND VERIFIED WITH: [place] @1729  01/10/20 MJU Performed at [facility], [address], [place], [place] [address]          Radiology Studies: No results found.      Scheduled Meds: . busPIRone  15 mg Oral BID  . Chlorhexidine Gluconate Cloth  6 each Topical Daily  . FLUoxetine  40 mg Oral Daily  . folic acid  1 mg Oral Daily  . levothyroxine  125 mcg Oral Q0600  . multivitamin with minerals  1 tablet Oral Daily   Continuous Infusions: . dexmedetomidine (PRECEDEX) IV infusion 0.6 mcg/kg/hr (01/11/20 0600)  . dexmedetomidine (PRECEDEX) IV infusion Stopped (01/11/20 0857)  . magnesium sulfate bolus IVPB    . thiamine injection 500 mg (01/12/20 1026)     LOS: 2 days    Time spent: 35 minutes    Sidney Ace, MD [facility] Pager [phone]  If 7PM-7AM, please contact night-coverage 01/12/2020, 12:00 PM

## 2020-01-13 DIAGNOSIS — G629 Polyneuropathy, unspecified: Secondary | ICD-10-CM

## 2020-01-13 DIAGNOSIS — R220 Localized swelling, mass and lump, head: Secondary | ICD-10-CM

## 2020-01-13 DIAGNOSIS — D696 Thrombocytopenia, unspecified: Secondary | ICD-10-CM

## 2020-01-13 LAB — COMPREHENSIVE METABOLIC PANEL
ALT: 54 U/L — ABNORMAL HIGH (ref 0–44)
AST: 71 U/L — ABNORMAL HIGH (ref 15–41)
Albumin: 3.7 g/dL (ref 3.5–5.0)
Alkaline Phosphatase: 76 U/L (ref 38–126)
Anion gap: 10 (ref 5–15)
BUN: 12 mg/dL (ref 6–20)
CO2: 25 mmol/L (ref 22–32)
Calcium: 9.2 mg/dL (ref 8.9–10.3)
Chloride: 103 mmol/L (ref 98–111)
Creatinine, Ser: 0.78 mg/dL (ref 0.61–1.24)
GFR calc Af Amer: >60 mL/min (ref >60)
GFR calc non Af Amer: >60 mL/min (ref >60)
Glucose, Bld: 98 mg/dL (ref 70–99)
Potassium: 3.8 mmol/L (ref 3.5–5.1)
Sodium: 138 mmol/L (ref 135–145)
Total Bilirubin: 1.3 mg/dL — ABNORMAL HIGH (ref 0.3–1.2)
Total Protein: 8.4 g/dL — ABNORMAL HIGH (ref 6.5–8.1)

## 2020-01-13 LAB — CBC WITH DIFFERENTIAL/PLATELET
Abs Immature Granulocytes: 0.01 10*3/uL (ref 0.00–0.07)
Basophils Absolute: 0 10*3/uL (ref 0.0–0.1)
Basophils Relative: 1 %
Eosinophils Absolute: 0.2 10*3/uL (ref 0.0–0.5)
Eosinophils Relative: 3 %
HCT: 36.8 % — ABNORMAL LOW (ref 39.0–52.0)
Hemoglobin: 11.9 g/dL — ABNORMAL LOW (ref 13.0–17.0)
Immature Granulocytes: 0 %
Lymphocytes Relative: 29 %
Lymphs Abs: 1.7 10*3/uL (ref 0.7–4.0)
MCH: 26.4 pg (ref 26.0–34.0)
MCHC: 32.3 g/dL (ref 30.0–36.0)
MCV: 81.6 fL (ref 80.0–100.0)
Monocytes Absolute: 0.6 10*3/uL (ref 0.1–1.0)
Monocytes Relative: 10 %
Neutro Abs: 3.4 10*3/uL (ref 1.7–7.7)
Neutrophils Relative %: 57 %
Platelets: 126 10*3/uL — ABNORMAL LOW (ref 150–400)
RBC: 4.51 MIL/uL (ref 4.22–5.81)
RDW: 20 % — ABNORMAL HIGH (ref 11.5–15.5)
WBC: 5.9 10*3/uL (ref 4.0–10.5)
nRBC: 0 % (ref 0.0–0.2)

## 2020-01-13 MED ORDER — LEVOTHYROXINE SODIUM 125 MCG PO TABS: 125.0000 ug | ORAL_TABLET | Freq: Every day | ORAL | 0 refills | Status: DC

## 2020-01-13 MED ORDER — ALUM & MAG HYDROXIDE-SIMETH 200-200-20 MG/5ML PO SUSP
30.0000 mL | Freq: Four times a day (QID) | ORAL | Status: DC | PRN
  Administered 2020-01-13: 30 mL via ORAL
  Filled 2020-01-13: qty 30

## 2020-01-13 MED ORDER — FLUOXETINE HCL 40 MG PO CAPS: 40.0000 mg | ORAL_CAPSULE | Freq: Every day | ORAL | 0 refills | Status: DC

## 2020-01-13 MED ORDER — BUSPIRONE HCL 15 MG PO TABS: 15.0000 mg | ORAL_TABLET | Freq: Two times a day (BID) | ORAL | 0 refills | Status: DC

## 2020-01-13 MED ORDER — GABAPENTIN 600 MG PO TABS
600.0000 mg | ORAL_TABLET | Freq: Three times a day (TID) | ORAL | Status: DC
  Administered 2020-01-13 – 2020-01-14 (×3): 600 mg via ORAL
  Filled 2020-01-13 (×3): qty 1

## 2020-01-13 MED ORDER — AMOXICILLIN-POT CLAVULANATE 875-125 MG PO TABS
1.0000 | ORAL_TABLET | Freq: Two times a day (BID) | ORAL | Status: DC
  Administered 2020-01-13 – 2020-01-14 (×2): 1 via ORAL
  Filled 2020-01-13 (×2): qty 1

## 2020-01-13 MED ORDER — THIAMINE HCL 100 MG PO TABS
100.0000 mg | ORAL_TABLET | Freq: Every day | ORAL | Status: DC
  Administered 2020-01-14: 100 mg via ORAL
  Filled 2020-01-13: qty 1

## 2020-01-13 MED ORDER — MUPIROCIN 2 % EX OINT
1.0000 "application " | TOPICAL_OINTMENT | Freq: Two times a day (BID) | CUTANEOUS | Status: DC
  Administered 2020-01-13 – 2020-01-14 (×2): 1 via NASAL
  Filled 2020-01-13: qty 22

## 2020-01-13 MED ORDER — GABAPENTIN 600 MG PO TABS: 600.0000 mg | ORAL_TABLET | Freq: Three times a day (TID) | ORAL | 0 refills | Status: DC

## 2020-01-13 MED ORDER — AMOXICILLIN-POT CLAVULANATE 875-125 MG PO TABS: 1.0000 | ORAL_TABLET | Freq: Two times a day (BID) | ORAL | 0 refills | Status: DC

## 2020-01-13 MED ORDER — FOLIC ACID 1 MG PO TABS: 1.0000 mg | ORAL_TABLET | Freq: Every day | ORAL | 0 refills | Status: DC

## 2020-01-13 MED ORDER — THIAMINE HCL 100 MG PO TABS: 100.0000 mg | ORAL_TABLET | Freq: Every day | ORAL | 0 refills | Status: DC

## 2020-01-13 NOTE — Progress Notes (Signed)
Intentional rounding by RRT RN on this patient s/p transfer out of CCU. Per bedside RN, no issues. RN instructed to call RRT if issues arise. 

## 2020-01-13 NOTE — Progress Notes (Signed)
Patient ID: Ivan Hamilton, male   DOB: [birth year], 55 y.o.   MRN: [id number] [facility] PROGRESS NOTE  Richar Dunklee [id number]:[id number] DOB: [birth year] DOA: 01/10/2020 PCP: Tracie Harrier, MD  HPI/Subjective: Came in for low blood pressure and fast heart rate.  Had a fall off a ladder.  Injured his lip.  Patient feeling okay.  Offers no complaints.  Objective: Vitals:   01/13/20 1539 01/13/20 1616  BP: (!) 142/95   Pulse: (!) 115 100  Resp: 17   Temp: 98.8 F (37.1 C)   SpO2: 100%     Intake/Output Summary (Last 24 hours) at 01/13/2020 1756 Last data filed at 01/13/2020 1415 Gross per 24 hour  Intake 905.12 ml  Output 3200 ml  Net -2294.88 ml   There were no vitals filed for this visit.  ROS: Review of Systems  Respiratory: Negative for shortness of breath.   Cardiovascular: Negative for chest pain.  Gastrointestinal: Negative for abdominal pain.  Musculoskeletal: Negative for back pain.   Exam: Physical Exam  HENT:  Nose: No mucosal edema.  Mouth/Throat: No oropharyngeal exudate.  Eyes: Pupils are equal, round, and reactive to light. Conjunctivae and lids are normal.  Cardiovascular: S1 normal and S2 normal. Exam reveals no gallop.  No murmur heard. Respiratory: No respiratory distress. He has no wheezes. He has no rhonchi. He has no rales.  GI: Soft. There is no abdominal tenderness.  Musculoskeletal:     Right wrist: No swelling.     Left wrist: No swelling.  Neurological: He is alert. No cranial nerve deficit.  Skin: Skin is warm. No rash noted.  Lip swollen.  Inside of his lip has 2 holes where his tooth may have went in.      Data Reviewed: Basic Metabolic Panel: Recent Labs  Lab 01/07/20 2037 01/10/20 0613 01/11/20 0745 01/12/20 0538 01/13/20 0518  NA 139 130* 137 137 138  K 3.7 3.3* 3.7 3.3* 3.8  CL 102 95* 108 106 103  CO2 22 24 22 24 25   GLUCOSE 148* 138* 102* 87 98  BUN 8 5* <5* 8 12  CREATININE 0.70 0.66 0.61 0.64 0.78  CALCIUM  8.7* 9.3 8.7* 8.7* 9.2  MG  --  1.4*  --   --   --   PHOS  --  2.8  --   --   --    Liver Function Tests: Recent Labs  Lab 01/07/20 2037 01/10/20 0613 01/11/20 0745 01/12/20 0538 01/13/20 0518  AST 81* 70* 123* 88* 71*  ALT 43 42 62* 55* 54*  ALKPHOS 91 76 70 72 76  BILITOT 1.4* 2.1* 1.6* 1.4* 1.3*  PROT 9.3* 7.4 7.1 7.3 8.4*  ALBUMIN 4.3 3.6 3.4* 3.4* 3.7    Recent Labs  Lab 01/09/20 1057  AMMONIA 33   CBC: Recent Labs  Lab 01/07/20 2037 01/10/20 0613 01/11/20 0745 01/12/20 0538 01/13/20 0518  WBC 11.0* 4.3 3.3* 5.2 5.9  NEUTROABS  --  3.1 2.2 3.1 3.4  HGB 12.6* 10.4* 10.8* 11.1* 11.9*  HCT 37.3* 30.6* 32.0* 31.6* 36.8*  MCV 77.9* 78.1* 78.6* 76.9* 81.6  PLT 217 PLATELET CLUMPS NOTED ON SMEAR, UNABLE TO ESTIMATE 83* 97* 126*    CBG: Recent Labs  Lab 01/10/20 1601  GLUCAP 109*    Recent Results (from the past 240 hour(s))  SARS Coronavirus 2 by RT PCR (hospital order, performed in [facility] hospital lab) Nasopharyngeal Nasopharyngeal Swab     Status: None   Collection Time: 01/08/20 12:55 PM  Specimen: Nasopharyngeal Swab  Result Value Ref Range Status   SARS Coronavirus 2 NEGATIVE NEGATIVE Final    Comment: (NOTE) SARS-CoV-2 target nucleic acids are NOT DETECTED.  The SARS-CoV-2 RNA is generally detectable in upper and lower respiratory specimens during the acute phase of infection. The lowest concentration of SARS-CoV-2 viral copies this assay can detect is 250 copies / mL. A negative result does not preclude SARS-CoV-2 infection and should not be used as the sole basis for treatment or other patient management decisions.  A negative result may occur with improper specimen collection / handling, submission of specimen other than nasopharyngeal swab, presence of viral mutation(s) within the areas targeted by this assay, and inadequate number of viral copies (<250 copies / mL). A negative result must be combined with clinical observations, patient  history, and epidemiological information.  Fact Sheet for Patients:   StrictlyIdeas.no  Fact Sheet for Healthcare Providers: BankingDealers.co.za  This test is not yet approved or  cleared by the [place] FDA and has been authorized for detection and/or diagnosis of SARS-CoV-2 by FDA under an Emergency Use Authorization (EUA).  This EUA will remain in effect (meaning this test can be used) for the duration of the COVID-19 declaration under Section 564(b)(1) of the Act, 21 U.S.C. section 360bbb-3(b)(1), unless the authorization is terminated or revoked sooner.  Performed at [facility], [address] [place], [place] [address]   MRSA PCR Screening     Status: Abnormal   Collection Time: 01/10/20  4:02 PM   Specimen: Nasal Mucosa; Nasopharyngeal  Result Value Ref Range Status   MRSA by PCR POSITIVE (A) NEGATIVE Final    Comment:        The GeneXpert MRSA Assay (FDA approved for NASAL specimens only), is one component of a comprehensive MRSA colonization surveillance program. It is not intended to diagnose MRSA infection nor to guide or monitor treatment for MRSA infections. RESULT CALLED TO, READ BACK BY AND VERIFIED WITH: [place] @1729  01/10/20 MJU Performed at [facility], [address] [place], [place] [address]      Scheduled Meds: . amoxicillin-clavulanate  1 tablet Oral Q12H  . busPIRone  15 mg Oral BID  . Chlorhexidine Gluconate Cloth  6 each Topical Daily  . FLUoxetine  40 mg Oral Daily  . folic acid  1 mg Oral Daily  . gabapentin  600 mg Oral TID  . levothyroxine  125 mcg Oral Q0600  . multivitamin with minerals  1 tablet Oral Daily  . [START ON 01/14/2020] thiamine  100 mg Oral Daily   Continuous Infusions:  Assessment/Plan:  1. Alcohol withdrawal.  Patient initially was in the stepdown unit on Precedex drip.  Now off Precedex drip.  Off Ativan.  He was placed on high-dose  thiamine is now on oral thiamine.  Patient has gone through withdrawal process. 2. Hypothyroidism unspecified on levothyroxine 3. Depression anxiety on buspirone Prozac 4. Neuropathy on gabapentin 5. Lip swelling.  Tooth partially going through his lip.  Empiric Augmentin. 6. Patient placed under involuntary commitment on presentation.  Awaiting psychiatric reevaluation to see if the patient can be discharged home or go to the psychiatric floor. 7. Thrombocytopenia.  Improving off alcohol        Code Status:     Code Status Orders  (From admission, onward)         Start     Ordered   01/10/20 0355  Full code  Continuous  01/10/20 0357        Code Status History    Date Active Date Inactive Code Status Order ID Comments User Context   01/10/2020 0337 01/10/2020 0357 Full Code [id number]  Sidney Ace Arvella Merles, MD Inpatient   01/08/2020 2052 01/10/2020 0326 Full Code [id number]  Eulas Post, MD Inpatient   01/08/2020 1000 01/08/2020 2052 Full Code [id number]  Eulas Post, MD ED   07/11/2019 1734 07/17/2019 1836 Full Code [id number]  Ivor Costa, MD Inpatient   08/28/2018 1930 09/01/2018 2140 Full Code [id number]  Lamont Dowdy, NP Inpatient   08/28/2018 4196 08/28/2018 1930 Full Code [id number]  Lavella Hammock, MD Inpatient   08/27/2018 2236 08/28/2018 1427 Full Code [id number]  Nena Polio, MD ED   10/22/2017 1454 10/28/2017 1805 Full Code [id number]  Saundra Shelling, MD ED   01/18/2017 1250 01/18/2017 2140 Full Code [id number]  Orbie Pyo, MD ED   11/02/2015 2000 11/05/2015 1938 Full Code [id number]  Henreitta Leber, MD Inpatient   09/01/2015 1650 09/05/2015 1648 Full Code [id number]  Clapacs, Madie Reno, MD Inpatient   Advance Care Planning Activity     Disposition Plan: Status is: Inpatient  Dispo: The patient is from: Home              Anticipated d/c is to: To the psychiatry floor versus home              Anticipated d/c date is: 01/13/2020 if IVC removed              Patient  currently medically stable to go to the psychiatric floor or to be discharged home.  Consultants:  Psychiatry  Time spent: 35 minutes  [place]

## 2020-01-13 NOTE — Care Management Important Message (Signed)
Important Message  Patient Details  Name: Ivan Hamilton MRN: [id number] Date of Birth: 03/01/1966   Medicare Important Message Given:  Yes     Shelbie Hutching, RN 01/13/2020, 2:35 PM

## 2020-01-13 NOTE — Discharge Summary (Signed)
[name] NAME: Ivan Hamilton    MR#:  [id number]  DATE OF BIRTH:  22-Feb-1966  DATE OF ADMISSION:  01/10/2020 ADMITTING PHYSICIAN: Sidney Ace, MD  DATE OF DISCHARGE: 01/13/2020  PRIMARY CARE PHYSICIAN: Tracie Harrier, MD    ADMISSION DIAGNOSIS:  Hypotension [I95.9] Alcohol withdrawal ([place]) [F10.239]  DISCHARGE DIAGNOSIS:  Active Problems:   Alcohol withdrawal (HCC)   Hypotension   SECONDARY DIAGNOSIS:   Past Medical History:  Diagnosis Date  . Alcohol withdrawal ([place])   . B12 deficiency   . Chronic pain   . CVA (cerebral vascular accident) ([place])   . Hypertension   . Hypothyroidism   . Varicose veins     HOSPITAL COURSE:   1.  Alcohol withdrawal.  Patient was in the stepdown unit on Precedex and was taken off Precedex and now off Ativan.  Patient was also placed on high-dose thiamine. 2.  Hypothyroidism unspecified on levothyroxine 3.  Depression anxiety on BuSpar and Prozac 4.  Neuropathy on gabapentin 5.  Patient placed under involuntary commitment on presentation.  Still awaiting psychiatric reevaluation to see if the patient can be discharged home or to the psych floor.  If psychiatry wants to take downstairs to the psych floor is stable to do so.  If the patient is cleared by psychiatry to go home can be discharged home. 6.  Lip swelling with tooth going partially through his lip will give Augmentin empirically for 1 week total.  DISCHARGE CONDITIONS:   Satisfactory  CONSULTS OBTAINED:  Treatment Team:  Eulas Post, MD  DRUG ALLERGIES:  No Known Allergies  DISCHARGE MEDICATIONS:   Allergies as of 01/13/2020   No Known Allergies     Medication List    TAKE these medications   amoxicillin-clavulanate 875-125 MG tablet Commonly known as: AUGMENTIN Take 1 tablet by mouth every 12 (twelve) hours.   busPIRone 15 MG tablet Commonly known as: BUSPAR Take 1 tablet (15 mg total) by  mouth 2 (two) times daily.   FLUoxetine 40 MG capsule Commonly known as: PROZAC Take 1 capsule (40 mg total) by mouth daily.   folic acid 1 MG tablet Commonly known as: FOLVITE Take 1 tablet (1 mg total) by mouth daily. Start taking on: January 14, 2020   gabapentin 600 MG tablet Commonly known as: NEURONTIN Take 1 tablet (600 mg total) by mouth 3 (three) times daily.   levothyroxine 125 MCG tablet Commonly known as: SYNTHROID Take 1 tablet (125 mcg total) by mouth daily before breakfast.   thiamine 100 MG tablet Take 1 tablet (100 mg total) by mouth daily. Start taking on: January 14, 2020        DISCHARGE INSTRUCTIONS:  Follow-up PMD 5 days upon discharge  If you experience worsening of your admission symptoms, develop shortness of breath, life threatening emergency, suicidal or homicidal thoughts you must seek medical attention immediately by calling 911 or calling your MD immediately  if symptoms less severe.  You Must read complete instructions/literature along with all the possible adverse reactions/side effects for all the Medicines you take and that have been prescribed to you. Take any new Medicines after you have completely understood and accept all the possible adverse reactions/side effects.   Please note  You were cared for by a hospitalist during your hospital stay. If you have any questions about your discharge medications or the care you received while you were in the hospital after you are discharged, you can  call the unit and asked to speak with the hospitalist on call if the hospitalist that took care of you is not available. Once you are discharged, your primary care physician will handle any further medical issues. Please note that NO REFILLS for any discharge medications will be authorized once you are discharged, as it is imperative that you return to your primary care physician (or establish a relationship with a primary care physician if you do not have one) for  your aftercare needs so that they can reassess your need for medications and monitor your lab values.    Today   CHIEF COMPLAINT:  No chief complaint on file.   HISTORY OF PRESENT ILLNESS:  Ivan Hamilton  is a 54 y.o. male came in with low blood pressure and fast heart rate   VITAL SIGNS:  Blood pressure (!) 142/95, pulse 100, temperature 98.8 F (37.1 C), temperature source Oral, resp. rate 17, height 5\' 5"  (1.651 m), SpO2 100 %.    DATA REVIEW:   CBC Recent Labs  Lab 01/13/20 0518  WBC 5.9  HGB 11.9*  HCT 36.8*  PLT 126*    Chemistries  Recent Labs  Lab 01/10/20 0613 01/11/20 0745 01/13/20 0518  NA 130*   < > 138  K 3.3*   < > 3.8  CL 95*   < > 103  CO2 24   < > 25  GLUCOSE 138*   < > 98  BUN 5*   < > 12  CREATININE 0.66   < > 0.78  CALCIUM 9.3   < > 9.2  MG 1.4*  --   --   AST 70*   < > 71*  ALT 42   < > 54*  ALKPHOS 76   < > 76  BILITOT 2.1*   < > 1.3*   < > = values in this interval not displayed.    Microbiology Results  Results for orders placed or performed during the hospital encounter of 01/10/20  MRSA PCR Screening     Status: Abnormal   Collection Time: 01/10/20  4:02 PM   Specimen: Nasal Mucosa; Nasopharyngeal  Result Value Ref Range Status   MRSA by PCR POSITIVE (A) NEGATIVE Final    Comment:        The GeneXpert MRSA Assay (FDA approved for NASAL specimens only), is one component of a comprehensive MRSA colonization surveillance program. It is not intended to diagnose MRSA infection nor to guide or monitor treatment for MRSA infections. RESULT CALLED TO, READ BACK BY AND VERIFIED WITH: [place] @1729  01/10/20 MJU Performed at [facility], [address], [place], [place] [address]       Management plans discussed with the patient, and he is in agreement.  CODE STATUS:     Code Status Orders  (From admission, onward)         Start     Ordered   01/10/20 0355  Full code  Continuous        01/10/20  0357        Code Status History    Date Active Date Inactive Code Status Order ID Comments User Context   01/10/2020 0337 01/10/2020 0357 Full Code [id number]  Sidney Ace Arvella Merles, MD Inpatient   01/08/2020 2052 01/10/2020 0326 Full Code [id number]  Eulas Post, MD Inpatient   01/08/2020 1000 01/08/2020 2052 Full Code [id number]  Eulas Post, MD ED   07/11/2019 1734 07/17/2019 1836 Full Code [id number]  Ivor Costa, MD  Inpatient   08/28/2018 1930 09/01/2018 2140 Full Code [id number]  Lamont Dowdy, NP Inpatient   08/28/2018 [place] 08/28/2018 1930 Full Code [id number]  Lavella Hammock, MD Inpatient   08/27/2018 2236 08/28/2018 1427 Full Code [id number]  Nena Polio, MD ED   10/22/2017 1454 10/28/2017 1805 Full Code [id number]  Saundra Shelling, MD ED   01/18/2017 1250 01/18/2017 2140 Full Code [id number]  Orbie Pyo, MD ED   11/02/2015 2000 11/05/2015 1938 Full Code [id number]  Henreitta Leber, MD Inpatient   09/01/2015 1650 09/05/2015 1648 Full Code [id number]  Clapacs, Madie Reno, MD Inpatient   Advance Care Planning Activity      TOTAL TIME TAKING CARE OF THIS PATIENT: 35 minutes.    Loletha Grayer M.D on 01/13/2020 at 5:56 PM  Between 7am to 6pm - Pager - [phone]  After 6pm go to [email or web address] - password [email or web address]  [facility]  CC: Primary care physician; Tracie Harrier, MD

## 2020-01-13 NOTE — Discharge Summary (Signed)
Physician Discharge Summary Note  Patient:  Ivan Hamilton is an 54 y.o., male MRN:  [id number] DOB:  1965-12-16 Patient phone:  [phone] (home)  Patient address:   [address] [place] [address],  Total Time spent with patient: 20 minutes  Date of Admission:  01/08/2020 Date of Discharge: 01/10/2020  Reason for Admission: Patient evidently admitted because of alcohol withdrawal  Principal Problem: <principal problem not specified> Discharge Diagnoses: Active Problems:   Bipolar disorder current episode depressed (HCC)   Hypotension   Past Psychiatric History: History of alcohol abuse and depression and PTSD  Past Medical History:  Past Medical History:  Diagnosis Date  . Alcohol withdrawal ([place])   . B12 deficiency   . Chronic pain   . CVA (cerebral vascular accident) ([place])   . Hypertension   . Hypothyroidism   . Varicose veins     Past Surgical History:  Procedure Laterality Date  . CHOLECYSTECTOMY    . ESOPHAGOGASTRODUODENOSCOPY (EGD) WITH PROPOFOL N/A 10/24/2017   Procedure: ESOPHAGOGASTRODUODENOSCOPY (EGD) WITH PROPOFOL;  Surgeon: [name], MD;  Location: [facility] ENDOSCOPY;  Service: Endoscopy;  Laterality: N/A;  . ESOPHAGOGASTRODUODENOSCOPY (EGD) WITH PROPOFOL N/A 07/12/2019   Procedure: ESOPHAGOGASTRODUODENOSCOPY (EGD) WITH PROPOFOL;  Surgeon: Jonathon Bellows, MD;  Location: [facility] ENDOSCOPY;  Service: Gastroenterology;  Laterality: N/A;  . LEG SURGERY     Family History: History reviewed. No pertinent family history. Family Psychiatric  History: See previous Social History:  Social History   Substance and Sexual Activity  Alcohol Use Yes   Comment: daily- 1 pint of vodka     Social History   Substance and Sexual Activity  Drug Use No    Social History   Socioeconomic History  . Marital status: Married    Spouse name: Not on file  . Number of children: Not on file  . Years of education: Not on file  . Highest education level: Not on file   Occupational History  . Not on file  Tobacco Use  . Smoking status: Never Smoker  . Smokeless tobacco: Never Used  Vaping Use  . Vaping Use: Never used  Substance and Sexual Activity  . Alcohol use: Yes    Comment: daily- 1 pint of vodka  . Drug use: No  . Sexual activity: Not on file  Other Topics Concern  . Not on file  Social History Narrative  . Not on file   Social Determinants of Health   Financial Resource Strain:   . Difficulty of Paying Living Expenses:   Food Insecurity:   . Worried About Charity fundraiser in the Last Year:   . Arboriculturist in the Last Year:   Transportation Needs:   . Film/video editor (Medical):   Marland Kitchen Lack of Transportation (Non-Medical):   Physical Activity:   . Days of Exercise per Week:   . Minutes of Exercise per Session:   Stress:   . Feeling of Stress :   Social Connections:   . Frequency of Communication with Friends and Family:   . Frequency of Social Gatherings with Friends and Family:   . Attends Religious Services:   . Active Member of Clubs or Organizations:   . Attends Archivist Meetings:   Marland Kitchen Marital Status:     Hospital Course: This discharge summary is being done to fulfill the computer need for a correct document.  I did not see this patient at any time during this hospital visit.  Evidently he was admitted  over the weekend because of his alcohol abuse and depression.  Medical symptoms required transfer to the medical service after a short time.  I was in no way involved in this patient's care at any time.  Presumably his follow-up will be done from the medicine unit  Physical Findings: AIMS:  , ,  ,  ,    CIWA:  CIWA-Ar Total: 37 COWS:     Musculoskeletal: Strength & Muscle Tone: within normal limits Gait & Station: normal Patient leans: N/A  Psychiatric Specialty Exam: Physical Exam  Constitutional: He appears well-developed.  HENT:  Head: Normocephalic and atraumatic.  Eyes: Pupils are  equal, round, and reactive to light. Conjunctivae are normal.  Cardiovascular: Normal heart sounds.  Respiratory: Effort normal.  GI: Soft.  Musculoskeletal:        General: Normal range of motion.     Cervical back: Normal range of motion.  Neurological: He is alert.  Skin: Skin is warm and dry.    Review of Systems  Constitutional: Negative.   HENT: Negative.   Eyes: Negative.   Respiratory: Negative.   Cardiovascular: Negative.   Gastrointestinal: Negative.   Musculoskeletal: Negative.   Skin: Negative.   Neurological: Negative.     Blood pressure 112/82, pulse 96, temperature 98.7 F (37.1 C), resp. rate 20, height 5\' 5"  (1.651 m), weight 63.5 kg, SpO2 97 %.Body mass index is 23.3 kg/m.  General Appearance: Casual  Eye Contact:  Fair  Speech:  Clear and Coherent  Volume:  Normal  Mood:  Euthymic  Affect:  Congruent  Thought Process:  Coherent  Orientation:  Full (Time, Place, and Person)  Thought Content:  Logical  Suicidal Thoughts:  No  Homicidal Thoughts:  No  Memory:  Immediate;   Fair Recent;   Fair Remote;   Fair  Judgement:  Fair  Insight:  Fair  Psychomotor Activity:  Normal  Concentration:  Concentration: Fair  Recall:  [place] of Knowledge:  Fair  Language:  Fair  Akathisia:  No  Handed:  Right  AIMS (if indicated):     Assets:  Desire for Improvement Housing  ADL's:  Impaired  Cognition:  Impaired,  Mild  Sleep:  Number of Hours: 7.25     Have you used any form of tobacco in the last 30 days? (Cigarettes, Smokeless Tobacco, Cigars, and/or Pipes): No  Has this patient used any form of tobacco in the last 30 days? (Cigarettes, Smokeless Tobacco, Cigars, and/or Pipes) Yes, No  Blood Alcohol level:  Lab Results  Component Value Date   ETH 471 ([place]) 01/07/2020   ETH 441 ([place]) 35/32/9924    Metabolic Disorder Labs:  Lab Results  Component Value Date   HGBA1C 5.7 (H) 08/29/2018   MPG 116.89 08/29/2018   MPG 99.67 10/26/2017   No  results found for: PROLACTIN Lab Results  Component Value Date   CHOL 179 08/29/2018   TRIG 75 08/29/2018   HDL 68 08/29/2018   CHOLHDL 2.6 08/29/2018   VLDL 15 08/29/2018   LDLCALC 96 08/29/2018   LDLCALC NOT CALCULATED 10/27/2017    See Psychiatric Specialty Exam and Suicide Risk Assessment completed by Attending Physician prior to discharge.  Discharge destination:  Other:  Patient being transferred to medical service of hospital  Is patient on multiple antipsychotic therapies at discharge:  No   Has Patient had three or more failed trials of antipsychotic monotherapy by history:  No  Recommended Plan for Multiple Antipsychotic Therapies: NA  Discharge Instructions  Discharge instructions   Complete by: As directed    To be admitted to Medical floor   Increase activity slowly   Complete by: As directed    Increase activity slowly   Complete by: As directed      Allergies as of 01/10/2020   No Known Allergies     Medication List    STOP taking these medications   calcium carbonate 500 MG chewable tablet Commonly known as: TUMS - dosed in mg elemental calcium   ferrous sulfate 325 (65 FE) MG EC tablet   folic acid 1 MG tablet Commonly known as: FOLVITE   gabapentin 600 MG tablet Commonly known as: NEURONTIN   hydrOXYzine 25 MG tablet Commonly known as: ATARAX/VISTARIL   pantoprazole 40 MG tablet Commonly known as: PROTONIX   QUEtiapine 25 MG tablet Commonly known as: SEROQUEL   thiamine 100 MG tablet   traZODone 50 MG tablet Commonly known as: DESYREL     TAKE these medications     Indication  busPIRone 15 MG tablet Commonly known as: BUSPAR Take 1 tablet (15 mg total) by mouth 2 (two) times daily.  Indication: Anxiety Disorder   FLUoxetine 40 MG capsule Commonly known as: PROZAC Take 1 capsule (40 mg total) by mouth daily.  Indication: Depression   levothyroxine 125 MCG tablet Commonly known as: SYNTHROID Take 1 tablet (125 mcg total)  by mouth daily before breakfast.  Indication: Underactive Thyroid        Follow-up recommendations:  Activity:  Activity according to the orders and needs of medical service Diet:  Diet according to medical service Other:  Please consult psychiatric services if needed  Comments: I was in no way involved in this patient's care but in completing the discharge summary to round out the chart.  Signed: Alethia Berthold, MD 01/13/2020, 3:13 PM

## 2020-01-14 LAB — COMPREHENSIVE METABOLIC PANEL
ALT: 55 U/L — ABNORMAL HIGH (ref 0–44)
AST: 66 U/L — ABNORMAL HIGH (ref 15–41)
Albumin: 3.7 g/dL (ref 3.5–5.0)
Alkaline Phosphatase: 76 U/L (ref 38–126)
Anion gap: 8 (ref 5–15)
BUN: 15 mg/dL (ref 6–20)
CO2: 24 mmol/L (ref 22–32)
Calcium: 9.1 mg/dL (ref 8.9–10.3)
Chloride: 104 mmol/L (ref 98–111)
Creatinine, Ser: 0.8 mg/dL (ref 0.61–1.24)
GFR calc Af Amer: >60 mL/min (ref >60)
GFR calc non Af Amer: >60 mL/min (ref >60)
Glucose, Bld: 94 mg/dL (ref 70–99)
Potassium: 3.9 mmol/L (ref 3.5–5.1)
Sodium: 136 mmol/L (ref 135–145)
Total Bilirubin: 1.2 mg/dL (ref 0.3–1.2)
Total Protein: 8.1 g/dL (ref 6.5–8.1)

## 2020-01-14 MED ORDER — MUPIROCIN 2 % EX OINT: 1.0000 "application " | TOPICAL_OINTMENT | Freq: Two times a day (BID) | CUTANEOUS | 0 refills | Status: AC

## 2020-01-14 NOTE — Progress Notes (Signed)
[facility] MD Progress Note  01/14/2020 9:39 AM Ivan Hamilton  MRN:  [id number] Subjective:  Ready to go home  Principal Problem:   Alcohol dependence, ipost intoxication and withdrawal  Major depression moderate recurrent Generalized Anxiety    Diagnosis: Active Problems:   Alcohol withdrawal (HCC)   Neuropathy   Anxiety and depression   Hypotension   Lip swelling  Total Time spent with patient:  25 minutes   Past Psychiatric History:  Previous similar episodes   Past Medical History:  Past Medical History:  Diagnosis Date  . Alcohol withdrawal ([place])   . B12 deficiency   . Chronic pain   . CVA (cerebral vascular accident) ([place])   . Hypertension   . Hypothyroidism   . Varicose veins     Past Surgical History:  Procedure Laterality Date  . CHOLECYSTECTOMY    . ESOPHAGOGASTRODUODENOSCOPY (EGD) WITH PROPOFOL N/A 10/24/2017   Procedure: ESOPHAGOGASTRODUODENOSCOPY (EGD) WITH PROPOFOL;  Surgeon: [name], MD;  Location: [facility] ENDOSCOPY;  Service: Endoscopy;  Laterality: N/A;  . ESOPHAGOGASTRODUODENOSCOPY (EGD) WITH PROPOFOL N/A 07/12/2019   Procedure: ESOPHAGOGASTRODUODENOSCOPY (EGD) WITH PROPOFOL;  Surgeon: Jonathon Bellows, MD;  Location: [facility] ENDOSCOPY;  Service: Gastroenterology;  Laterality: N/A;  . LEG SURGERY     Family History: History reviewed. No pertinent family history. Family Psychiatric  History:  Parents with history of depression  Social History:  Lives with wife and daughter  Social History   Substance and Sexual Activity  Alcohol Use Yes   Comment: daily- 1 pint of vodka     Social History   Substance and Sexual Activity  Drug Use No    Social History   Socioeconomic History  . Marital status: Married    Spouse name: Not on file  . Number of children: Not on file  . Years of education: Not on file  . Highest education level: Not on file  Occupational History  . Not on file  Tobacco Use  . Smoking status: Never Smoker  . Smokeless tobacco:  Never Used  Vaping Use  . Vaping Use: Never used  Substance and Sexual Activity  . Alcohol use: Yes    Comment: daily- 1 pint of vodka  . Drug use: No  . Sexual activity: Not on file  Other Topics Concern  . Not on file  Social History Narrative  . Not on file   Social Determinants of Health   Financial Resource Strain:   . Difficulty of Paying Living Expenses:   Food Insecurity:   . Worried About Charity fundraiser in the Last Year:   . Arboriculturist in the Last Year:   Transportation Needs:   . Film/video editor (Medical):   Marland Kitchen Lack of Transportation (Non-Medical):   Physical Activity:   . Days of Exercise per Week:   . Minutes of Exercise per Session:   Stress:   . Feeling of Stress :   Social Connections:   . Frequency of Communication with Friends and Family:   . Frequency of Social Gatherings with Friends and Family:   . Attends Religious Services:   . Active Member of Clubs or Organizations:   . Attends Archivist Meetings:   Marland Kitchen Marital Status:    Additional Social History:  He is willing to do Rehab.  His IVC is expiring   He has no imminent criteria to keep him on one   We cannot necessarily guarantee a direct transfer to rehab.  It does not work  the same way as a regular hospital.  Heh as to go to an intake if there are beds and all.  Even if he has legal issues and all we cannot assure a direct transfer  Also, with alcoholism the person themselves has to elect to go or not.  If not imminently suicidal or homicidal we cannot guarantee he will go and if he has legal probation issues.  He has to contest with that after a medical discharge if he chooses to not go.  Going to a rehab does not assure a continued sobriety   He wants to stay on his prozac 40 and buspar 15 twice a day at discharge                          Sleep: more normal now   Appetite:   Now normal   Current Medications: Current Facility-Administered Medications   Medication Dose Route Frequency Provider Last Rate Last Admin  . acetaminophen (TYLENOL) tablet 650 mg  650 mg Oral Q6H PRN Ralene Muskrat B, MD   650 mg at 01/13/20 1612  . alum & mag hydroxide-simeth (MAALOX/MYLANTA) 200-200-20 MG/5ML suspension 30 mL  30 mL Oral Q6H PRN Loletha Grayer, MD   30 mL at 01/13/20 1044  . amoxicillin-clavulanate (AUGMENTIN) 875-125 MG per tablet 1 tablet  1 tablet Oral Q12H Loletha Grayer, MD   1 tablet at 01/13/20 2111  . busPIRone (BUSPAR) tablet 15 mg  15 mg Oral BID Mansy, Jan A, MD   15 mg at 01/13/20 2114  . Chlorhexidine Gluconate Cloth 2 % PADS 6 each  6 each Topical Daily Tyler Pita, MD   6 each at 01/13/20 1044  . FLUoxetine (PROZAC) capsule 40 mg  40 mg Oral Daily Mansy, Jan A, MD   40 mg at 01/13/20 1043  . folic acid (FOLVITE) tablet 1 mg  1 mg Oral Daily Sreenath, Sudheer B, MD   1 mg at 01/13/20 1044  . gabapentin (NEURONTIN) tablet 600 mg  600 mg Oral TID Loletha Grayer, MD   600 mg at 01/13/20 2114  . levothyroxine (SYNTHROID) tablet 125 mcg  125 mcg Oral Q0600 Ralene Muskrat B, MD   125 mcg at 01/14/20 0609  . LORazepam (ATIVAN) injection 2 mg  2 mg Intravenous Q6H PRN Ralene Muskrat B, MD      . multivitamin with minerals tablet 1 tablet  1 tablet Oral Daily Ralene Muskrat B, MD   1 tablet at 01/13/20 1044  . mupirocin ointment (BACTROBAN) 2 % 1 application  1 application Nasal BID Loletha Grayer, MD   1 application at 21/19/41 2115  . thiamine tablet 100 mg  100 mg Oral Daily Loletha Grayer, MD      . traZODone (DESYREL) tablet 50 mg  50 mg Oral QHS PRN Sharion Settler, NP   50 mg at 01/13/20 2113    Lab Results:  Results for orders placed or performed during the hospital encounter of 01/10/20 (from the past 48 hour(s))  CBC with Differential/Platelet     Status: Abnormal   Collection Time: 01/13/20  5:18 AM  Result Value Ref Range   WBC 5.9 4.0 - 10.5 K/uL   RBC 4.51 4.22 - 5.81 MIL/uL   Hemoglobin 11.9 (L)  13.0 - 17.0 g/dL   HCT 36.8 (L) 39 - 52 %   MCV 81.6 80.0 - 100.0 fL   MCH 26.4 26.0 - 34.0 pg   MCHC 32.3 30.0 -  36.0 g/dL   RDW 20.0 (H) 11.5 - 15.5 %   Platelets 126 (L) 150 - 400 K/uL    Comment: Immature Platelet Fraction may be clinically indicated, consider ordering this additional test [id number]    nRBC 0.0 0.0 - 0.2 %   Neutrophils Relative % 57 %   Neutro Abs 3.4 1.7 - 7.7 K/uL   Lymphocytes Relative 29 %   Lymphs Abs 1.7 0.7 - 4.0 K/uL   Monocytes Relative 10 %   Monocytes Absolute 0.6 0 - 1 K/uL   Eosinophils Relative 3 %   Eosinophils Absolute 0.2 0 - 0 K/uL   Basophils Relative 1 %   Basophils Absolute 0.0 0 - 0 K/uL   Immature Granulocytes 0 %   Abs Immature Granulocytes 0.01 0.00 - 0.07 K/uL    Comment: Performed at [facility], [address] [place], [place] [address]  Comprehensive metabolic panel     Status: Abnormal   Collection Time: 01/13/20  5:18 AM  Result Value Ref Range   Sodium 138 135 - 145 mmol/L   Potassium 3.8 3.5 - 5.1 mmol/L   Chloride 103 98 - 111 mmol/L   CO2 25 22 - 32 mmol/L   Glucose, Bld 98 70 - 99 mg/dL    Comment: Glucose reference range applies only to samples taken after fasting for at least 8 hours.   BUN 12 6 - 20 mg/dL   Creatinine, Ser 0.78 0.61 - 1.24 mg/dL   Calcium 9.2 8.9 - 10.3 mg/dL   Total Protein 8.4 (H) 6.5 - 8.1 g/dL   Albumin 3.7 3.5 - 5.0 g/dL   AST 71 (H) 15 - 41 U/L   ALT 54 (H) 0 - 44 U/L   Alkaline Phosphatase 76 38 - 126 U/L   Total Bilirubin 1.3 (H) 0.3 - 1.2 mg/dL   GFR calc non Af Amer >60 >60 mL/min   GFR calc Af Amer >60 >60 mL/min   Anion gap 10 5 - 15    Comment: Performed at [facility], [address] [place], [place] [address]  Comprehensive metabolic panel     Status: Abnormal   Collection Time: 01/14/20  4:44 AM  Result Value Ref Range   Sodium 136 135 - 145 mmol/L   Potassium 3.9 3.5 - 5.1 mmol/L   Chloride 104 98 - 111 mmol/L   CO2 24 22 - 32 mmol/L   Glucose,  Bld 94 70 - 99 mg/dL    Comment: Glucose reference range applies only to samples taken after fasting for at least 8 hours.   BUN 15 6 - 20 mg/dL   Creatinine, Ser 0.80 0.61 - 1.24 mg/dL   Calcium 9.1 8.9 - 10.3 mg/dL   Total Protein 8.1 6.5 - 8.1 g/dL   Albumin 3.7 3.5 - 5.0 g/dL   AST 66 (H) 15 - 41 U/L   ALT 55 (H) 0 - 44 U/L   Alkaline Phosphatase 76 38 - 126 U/L   Total Bilirubin 1.2 0.3 - 1.2 mg/dL   GFR calc non Af Amer >60 >60 mL/min   GFR calc Af Amer >60 >60 mL/min   Anion gap 8 5 - 15    Comment: Performed at [facility], [address], [place], [place] [address]    Blood Alcohol level:  Lab Results  Component Value Date   GBT [facility]) 01/07/2020   ETH 441 (HH) 61/60/7371    Metabolic Disorder Labs: Lab Results  Component  Value Date   HGBA1C 5.7 (H) 08/29/2018   MPG 116.89 08/29/2018   MPG 99.67 10/26/2017   No results found for: PROLACTIN Lab Results  Component Value Date   CHOL 179 08/29/2018   TRIG 75 08/29/2018   HDL 68 08/29/2018   CHOLHDL 2.6 08/29/2018   VLDL 15 08/29/2018   LDLCALC 96 08/29/2018   LDLCALC NOT CALCULATED 10/27/2017    Physical Findings: AIMS:  , ,  ,  ,    CIWA:  Will end with discharge     Musculoskeletal: Strength & Muscle Tone:  Normal no major shakes and tremors  Gait & Station:  Normal  Patient leans: n/a   Psychiatric Specialty Exam: Physical Exam  Review of Systems  Blood pressure 137/80, pulse 74, temperature 97.7 F (36.5 C), temperature source Oral, resp. rate 16, height 5\' 5"  (1.651 m), SpO2 98 %.Body mass index is 23.3 kg/m.        Mental Status   Alert cooperative oriented to person place date and time Consciousness not clouded or fluctuant Concentration and attention normal  Mood ---euthymic Affect nomal  Speech normal rate tone volume fluency Thought process --no illogical or other psychotic issues or manic issues Though Content --no delusions hallucinations paranoia  Memory --remote  recent and immediate intact through general questions Fund of knowledge and intelligence normal Abstraction normal  SI and HI --none contracts for safety does not meet IVC criteria for extension  Judgement insight reliability--fair to poor but improing  Rapport --more normal Appearance improved ---less haggard and unkept Better eye contact and relating                                            ADL's:  Normal   Cognition:  Normal   Sleep:   better with three sleep phases      Treatment Plan Summary:  Dual diagnosis patient ready for discharge He elects to go home and then go to his own rehab  He can stay on current meds with appt to community mental health in one month  SW can help arrange   He has to do intake at any rehab first   I will write and carry up his signed paper IVC rescinding   Eulas Post, MD 01/14/2020, 9:39 AM

## 2020-01-14 NOTE — Discharge Summary (Signed)
[facility] [place] NAME: Ivan Hamilton    MR#:  [id number]  DATE OF BIRTH:  03-06-1966  DATE OF ADMISSION:  01/10/2020 ADMITTING PHYSICIAN: Sidney Ace, MD  DATE OF DISCHARGE: 01/14/2020  PRIMARY CARE PHYSICIAN: Tracie Harrier, MD    ADMISSION DIAGNOSIS:  Hypotension [I95.9] Alcohol withdrawal ([place]) [F10.239]  DISCHARGE DIAGNOSIS:  Active Problems:   Alcohol withdrawal (HCC)   Neuropathy   Anxiety and depression   Hypotension   Lip swelling   SECONDARY DIAGNOSIS:   Past Medical History:  Diagnosis Date  . Alcohol withdrawal ([place])   . B12 deficiency   . Chronic pain   . CVA (cerebral vascular accident) ([place])   . Hypertension   . Hypothyroidism   . Varicose veins     HOSPITAL COURSE:   1.  Alcohol withdrawal.  The patient has gone through alcohol withdrawal the entire process while here in the hospital.  Was initially on Precedex drip in the stepdown unit.  Currently off Ativan also.  He was initially on high-dose thiamine now on oral thiamine.  Patient interested in going to an alcohol rehab program as outpatient 2.  Hypothyroidism unspecified on levothyroxine 3.  Depression anxiety.  Psychiatry recommended continue BuSpar and Prozac. 4.  Neuropathy on gabapentin 5.  Lip swelling.  Tooth partially going through his lip.  Empiric Augmentin. 6.  Patient placed under involuntary commitment on presentation.  Patient was seen by Dr. Janese Banks psychiatry and cleared from a psychiatric standpoint to go home.  Involuntary commitment was discontinued. 7.  Thrombocytopenia improved off alcohol.  DISCHARGE CONDITIONS:   Satisfactory  CONSULTS OBTAINED:  Treatment Team:  Eulas Post, MD  DRUG ALLERGIES:  No Known Allergies  DISCHARGE MEDICATIONS:   Allergies as of 01/14/2020   No Known Allergies     Medication List    STOP taking these medications   acetaminophen 325 MG tablet Commonly known as: TYLENOL    calcium carbonate 750 MG chewable tablet Commonly known as: TUMS EX   ferrous sulfate 325 (65 FE) MG tablet   hydrOXYzine 25 MG tablet Commonly known as: ATARAX/VISTARIL   pantoprazole 40 MG tablet Commonly known as: PROTONIX   QUEtiapine 25 MG tablet Commonly known as: SEROQUEL     TAKE these medications   amoxicillin-clavulanate 875-125 MG tablet Commonly known as: AUGMENTIN Take 1 tablet by mouth every 12 (twelve) hours.   busPIRone 15 MG tablet Commonly known as: BUSPAR Take 1 tablet (15 mg total) by mouth 2 (two) times daily.   FLUoxetine 40 MG capsule Commonly known as: PROZAC Take 1 capsule (40 mg total) by mouth daily.   folic acid 1 MG tablet Commonly known as: FOLVITE Take 1 tablet (1 mg total) by mouth daily.   gabapentin 600 MG tablet Commonly known as: NEURONTIN Take 1 tablet (600 mg total) by mouth 3 (three) times daily.   levothyroxine 125 MCG tablet Commonly known as: SYNTHROID Take 1 tablet (125 mcg total) by mouth daily before breakfast.   mupirocin ointment 2 % Commonly known as: BACTROBAN Place 1 application into the nose 2 (two) times daily for 5 days.   thiamine 100 MG tablet Take 1 tablet (100 mg total) by mouth daily. What changed:   medication strength  how much to take        DISCHARGE INSTRUCTIONS:   Follow-up Dr. Ginette Pitman 5 days  If you experience worsening of your admission symptoms, develop shortness of breath, life threatening emergency, suicidal  or homicidal thoughts you must seek medical attention immediately by calling 911 or calling your MD immediately  if symptoms less severe.  You Must read complete instructions/literature along with all the possible adverse reactions/side effects for all the Medicines you take and that have been prescribed to you. Take any new Medicines after you have completely understood and accept all the possible adverse reactions/side effects.   Please note  You were cared for by a hospitalist  during your hospital stay. If you have any questions about your discharge medications or the care you received while you were in the hospital after you are discharged, you can call the unit and asked to speak with the hospitalist on call if the hospitalist that took care of you is not available. Once you are discharged, your primary care physician will handle any further medical issues. Please note that NO REFILLS for any discharge medications will be authorized once you are discharged, as it is imperative that you return to your primary care physician (or establish a relationship with a primary care physician if you do not have one) for your aftercare needs so that they can reassess your need for medications and monitor your lab values.    Today   CHIEF COMPLAINT:  No chief complaint on file.   HISTORY OF PRESENT ILLNESS:  Ivan Hamilton  is a 54 y.o. male initially coming in with low blood pressure and fast heart rate and a fall off a ladder.   VITAL SIGNS:  Blood pressure 137/80, pulse 74, temperature 97.7 F (36.5 C), temperature source Oral, resp. rate 16, height 5\' 5"  (1.651 m), SpO2 98 %.  I/O:    Intake/Output Summary (Last 24 hours) at 01/14/2020 1536 Last data filed at 01/13/2020 1650 Gross per 24 hour  Intake 240 ml  Output --  Net 240 ml    PHYSICAL EXAMINATION:  GENERAL:  54 y.o.-year-old patient lying in the bed with no acute distress.  EYES: Pupils equal, round, reactive to light and accommodation. No scleral icterus. Extraocular muscles intact.  HEENT: Lip swelling a little bit better today after starting antibiotic.  LUNGS: Normal breath sounds bilaterally, no wheezing, rales,rhonchi or crepitation. No use of accessory muscles of respiration.  CARDIOVASCULAR: S1, S2 normal. No murmurs, rubs, or gallops.  ABDOMEN: Soft, non-tender, non-distended. EXTREMITIES: No pedal edema.  NEUROLOGIC: Cranial nerves II through XII are intact. Muscle strength 5/5 in all  extremities. Sensation intact. Gait not checked.  PSYCHIATRIC: The patient is alert and oriented x 3.     DATA REVIEW:   CBC Recent Labs  Lab 01/13/20 0518  WBC 5.9  HGB 11.9*  HCT 36.8*  PLT 126*    Chemistries  Recent Labs  Lab 01/10/20 0613 01/11/20 0745 01/14/20 0444  NA 130*   < > 136  K 3.3*   < > 3.9  CL 95*   < > 104  CO2 24   < > 24  GLUCOSE 138*   < > 94  BUN 5*   < > 15  CREATININE 0.66   < > 0.80  CALCIUM 9.3   < > 9.1  MG 1.4*  --   --   AST 70*   < > 66*  ALT 42   < > 55*  ALKPHOS 76   < > 76  BILITOT 2.1*   < > 1.2   < > = values in this interval not displayed.    Microbiology Results  Results for orders placed or  performed during the hospital encounter of 01/10/20  MRSA PCR Screening     Status: Abnormal   Collection Time: 01/10/20  4:02 PM   Specimen: Nasal Mucosa; Nasopharyngeal  Result Value Ref Range Status   MRSA by PCR POSITIVE (A) NEGATIVE Final    Comment:        The GeneXpert MRSA Assay (FDA approved for NASAL specimens only), is one component of a comprehensive MRSA colonization surveillance program. It is not intended to diagnose MRSA infection nor to guide or monitor treatment for MRSA infections. RESULT CALLED TO, READ BACK BY AND VERIFIED WITH: [place] @1729  01/10/20 MJU Performed at [facility], [address], [place], [place] [address]      Management plans discussed with the patient, family (daughter) and they are in agreement.  Daughter concerned that he will tell you that he is interested in quitting but will go back to drinking alcohol.  I said is going to be up to him on whether he goes through with quitting alcohol during the long-term.  CODE STATUS:     Code Status Orders  (From admission, onward)         Start     Ordered   01/10/20 0355  Full code  Continuous        01/10/20 0357        Code Status History    Date Active Date Inactive Code Status Order ID Comments User Context    01/10/2020 0337 01/10/2020 0357 Full Code [id number]  Sidney Ace Arvella Merles, MD Inpatient   01/08/2020 2052 01/10/2020 0326 Full Code [id number]  Eulas Post, MD Inpatient   01/08/2020 1000 01/08/2020 2052 Full Code [id number]  Eulas Post, MD ED   07/11/2019 1734 07/17/2019 1836 Full Code [id number]  Ivor Costa, MD Inpatient   08/28/2018 1930 09/01/2018 2140 Full Code [id number]  Lamont Dowdy, NP Inpatient   08/28/2018 7035 08/28/2018 1930 Full Code [id number]  Lavella Hammock, MD Inpatient   08/27/2018 2236 08/28/2018 1427 Full Code [id number]  Nena Polio, MD ED   10/22/2017 1454 10/28/2017 1805 Full Code [id number]  Saundra Shelling, MD ED   01/18/2017 1250 01/18/2017 2140 Full Code [id number]  Orbie Pyo, MD ED   11/02/2015 2000 11/05/2015 1938 Full Code [id number]  Henreitta Leber, MD Inpatient   09/01/2015 1650 09/05/2015 1648 Full Code [id number]  Clapacs, Madie Reno, MD Inpatient   Advance Care Planning Activity      TOTAL TIME TAKING CARE OF THIS PATIENT: 34 minutes.    Loletha Grayer M.D on 01/14/2020 at 3:36 PM  Between 7am to 6pm - Pager - [phone]  After 6pm go to [email or web address] - password [email or web address]  [facility]  CC: Primary care physician; Tracie Harrier, MD

## 2020-01-14 NOTE — Plan of Care (Signed)
Pt not seen today by Psych MD. Looks like inpatient IVC expires today.  Very concerned that if pt doesn't go straight to rehab from hospital, the cycle will repeat.  He's mentioned that he told his wife that he will enter [facility] in [place].  Concerned that this will not happen if he's allowed to go home. According to note from [facility] on 6/22, he has a Engineer, manufacturing systems who informed her that there's a court order for alcohol tx.

## 2020-01-14 NOTE — TOC Transition Note (Signed)
Transition of Care [facility]) - CM/SW Discharge Note   Patient Details  Name: Ivan Hamilton MRN: [id number] Date of Birth: 1966-02-04  [facility] CM/SW Contact:  Shelbie Hutching, RN Phone Number: 01/14/2020, 1:10 PM   Clinical Narrative:    Patient has been cleared by psychiatry and will discharge today.  Patient will be discharging home, he has a family member coming to pick him up around 3pm.  Patient is very interested in doing inpatient substance abuse treatment.  This RNCM provide patient a list of treatment centers in [place], [place] called but they do not accept Medicare for their residential program.  Patient reports that he has a list of places at home that are in network with [facility] and he will follow up.  He reports he will call all the places on the list to find a place for treatment.     Final next level of care: Home/Self Care Barriers to Discharge: Barriers Resolved   Patient Goals and CMS Choice Patient states their goals for this hospitalization and ongoing recovery are:: wants to go to inpatient substance abuse treatment      Discharge Placement                       Discharge Plan and Services   Discharge Planning Services: CM Consult                                 Social Determinants of Health (SDOH) Interventions     Readmission Risk Interventions No flowsheet data found.

## 2020-01-15 DIAGNOSIS — G4700 Insomnia, unspecified: Secondary | ICD-10-CM | POA: Diagnosis not present

## 2020-01-15 DIAGNOSIS — Z09 Encounter for follow-up examination after completed treatment for conditions other than malignant neoplasm: Secondary | ICD-10-CM | POA: Diagnosis not present

## 2020-01-15 DIAGNOSIS — F329 Major depressive disorder, single episode, unspecified: Secondary | ICD-10-CM | POA: Diagnosis not present

## 2020-01-15 DIAGNOSIS — F1020 Alcohol dependence, uncomplicated: Secondary | ICD-10-CM | POA: Diagnosis not present

## 2020-01-15 DIAGNOSIS — M545 Low back pain: Secondary | ICD-10-CM | POA: Diagnosis not present

## 2020-01-15 DIAGNOSIS — K219 Gastro-esophageal reflux disease without esophagitis: Secondary | ICD-10-CM | POA: Diagnosis not present

## 2020-01-15 DIAGNOSIS — R441 Visual hallucinations: Secondary | ICD-10-CM | POA: Diagnosis not present

## 2020-01-15 DIAGNOSIS — S00511A Abrasion of lip, initial encounter: Secondary | ICD-10-CM | POA: Diagnosis not present

## 2020-01-15 DIAGNOSIS — E039 Hypothyroidism, unspecified: Secondary | ICD-10-CM | POA: Diagnosis not present

## 2020-01-18 ENCOUNTER — Ambulatory Visit: Payer: Medicare HMO | Admitting: Psychology

## 2020-01-19 DIAGNOSIS — M545 Low back pain: Secondary | ICD-10-CM | POA: Diagnosis not present

## 2020-01-19 DIAGNOSIS — F4310 Post-traumatic stress disorder, unspecified: Secondary | ICD-10-CM | POA: Diagnosis not present

## 2020-01-19 DIAGNOSIS — K219 Gastro-esophageal reflux disease without esophagitis: Secondary | ICD-10-CM | POA: Diagnosis not present

## 2020-01-19 DIAGNOSIS — E559 Vitamin D deficiency, unspecified: Secondary | ICD-10-CM | POA: Diagnosis not present

## 2020-01-19 DIAGNOSIS — E039 Hypothyroidism, unspecified: Secondary | ICD-10-CM | POA: Diagnosis not present

## 2020-01-19 DIAGNOSIS — K5900 Constipation, unspecified: Secondary | ICD-10-CM | POA: Diagnosis not present

## 2020-01-19 DIAGNOSIS — F411 Generalized anxiety disorder: Secondary | ICD-10-CM | POA: Diagnosis not present

## 2020-01-19 DIAGNOSIS — F331 Major depressive disorder, recurrent, moderate: Secondary | ICD-10-CM | POA: Diagnosis not present

## 2020-01-19 DIAGNOSIS — F1020 Alcohol dependence, uncomplicated: Secondary | ICD-10-CM | POA: Diagnosis not present

## 2020-01-21 DIAGNOSIS — E559 Vitamin D deficiency, unspecified: Secondary | ICD-10-CM | POA: Diagnosis not present

## 2020-01-21 DIAGNOSIS — F411 Generalized anxiety disorder: Secondary | ICD-10-CM | POA: Diagnosis not present

## 2020-01-21 DIAGNOSIS — F4310 Post-traumatic stress disorder, unspecified: Secondary | ICD-10-CM | POA: Diagnosis not present

## 2020-01-21 DIAGNOSIS — K5900 Constipation, unspecified: Secondary | ICD-10-CM | POA: Diagnosis not present

## 2020-01-21 DIAGNOSIS — M545 Low back pain: Secondary | ICD-10-CM | POA: Diagnosis not present

## 2020-01-21 DIAGNOSIS — K219 Gastro-esophageal reflux disease without esophagitis: Secondary | ICD-10-CM | POA: Diagnosis not present

## 2020-01-21 DIAGNOSIS — F331 Major depressive disorder, recurrent, moderate: Secondary | ICD-10-CM | POA: Diagnosis not present

## 2020-01-21 DIAGNOSIS — F1020 Alcohol dependence, uncomplicated: Secondary | ICD-10-CM | POA: Diagnosis not present

## 2020-01-21 DIAGNOSIS — E039 Hypothyroidism, unspecified: Secondary | ICD-10-CM | POA: Diagnosis not present

## 2020-05-09 ENCOUNTER — Emergency Department
Admission: EM | Admit: 2020-05-09 | Discharge: 2020-05-11 | Disposition: A | Discharge status: Other Acute Inpatient Hospital | Payer: Medicare HMO | Attending: Emergency Medicine | Admitting: Emergency Medicine

## 2020-05-09 ENCOUNTER — Encounter: Payer: Self-pay | Admitting: *Deleted

## 2020-05-09 ENCOUNTER — Other Ambulatory Visit: Payer: Self-pay

## 2020-05-09 DIAGNOSIS — Z20822 Contact with and (suspected) exposure to covid-19: Secondary | ICD-10-CM | POA: Diagnosis not present

## 2020-05-09 DIAGNOSIS — F10129 Alcohol abuse with intoxication, unspecified: Secondary | ICD-10-CM | POA: Diagnosis not present

## 2020-05-09 DIAGNOSIS — R45851 Suicidal ideations: Secondary | ICD-10-CM | POA: Insufficient documentation

## 2020-05-09 DIAGNOSIS — F10939 Alcohol use, unspecified with withdrawal, unspecified: Secondary | ICD-10-CM | POA: Diagnosis present

## 2020-05-09 DIAGNOSIS — F10951 Alcohol use, unspecified with alcohol-induced psychotic disorder with hallucinations: Secondary | ICD-10-CM | POA: Diagnosis present

## 2020-05-09 DIAGNOSIS — F4310 Post-traumatic stress disorder, unspecified: Secondary | ICD-10-CM | POA: Diagnosis present

## 2020-05-09 DIAGNOSIS — F333 Major depressive disorder, recurrent, severe with psychotic symptoms: Secondary | ICD-10-CM | POA: Diagnosis not present

## 2020-05-09 DIAGNOSIS — I10 Essential (primary) hypertension: Secondary | ICD-10-CM | POA: Diagnosis not present

## 2020-05-09 DIAGNOSIS — K219 Gastro-esophageal reflux disease without esophagitis: Secondary | ICD-10-CM | POA: Diagnosis present

## 2020-05-09 DIAGNOSIS — F1010 Alcohol abuse, uncomplicated: Secondary | ICD-10-CM

## 2020-05-09 DIAGNOSIS — F1020 Alcohol dependence, uncomplicated: Secondary | ICD-10-CM | POA: Diagnosis present

## 2020-05-09 DIAGNOSIS — F10239 Alcohol dependence with withdrawal, unspecified: Secondary | ICD-10-CM | POA: Diagnosis present

## 2020-05-09 DIAGNOSIS — E039 Hypothyroidism, unspecified: Secondary | ICD-10-CM | POA: Diagnosis not present

## 2020-05-09 DIAGNOSIS — R440 Auditory hallucinations: Secondary | ICD-10-CM | POA: Insufficient documentation

## 2020-05-09 DIAGNOSIS — Y909 Presence of alcohol in blood, level not specified: Secondary | ICD-10-CM | POA: Insufficient documentation

## 2020-05-09 DIAGNOSIS — F4312 Post-traumatic stress disorder, chronic: Secondary | ICD-10-CM | POA: Diagnosis present

## 2020-05-09 DIAGNOSIS — T1491XA Suicide attempt, initial encounter: Secondary | ICD-10-CM

## 2020-05-09 LAB — URINE DRUG SCREEN, QUALITATIVE (ARMC ONLY)
Amphetamines, Ur Screen: NOT DETECTED
Barbiturates, Ur Screen: NOT DETECTED
Benzodiazepine, Ur Scrn: NOT DETECTED
Cannabinoid 50 Ng, Ur ~~LOC~~: NOT DETECTED
Cocaine Metabolite,Ur ~~LOC~~: NOT DETECTED
MDMA (Ecstasy)Ur Screen: NOT DETECTED
Methadone Scn, Ur: NOT DETECTED
Opiate, Ur Screen: NOT DETECTED
Phencyclidine (PCP) Ur S: NOT DETECTED
Tricyclic, Ur Screen: NOT DETECTED

## 2020-05-09 LAB — ETHANOL: Alcohol, Ethyl (B): 379 mg/dL (ref <10)

## 2020-05-09 LAB — CBC
HCT: 38.3 % — ABNORMAL LOW (ref 39.0–52.0)
Hemoglobin: 12.5 g/dL — ABNORMAL LOW (ref 13.0–17.0)
MCH: 26.9 pg (ref 26.0–34.0)
MCHC: 32.6 g/dL (ref 30.0–36.0)
MCV: 82.4 fL (ref 80.0–100.0)
Platelets: 262 10*3/uL (ref 150–400)
RBC: 4.65 MIL/uL (ref 4.22–5.81)
RDW: 20.5 % — ABNORMAL HIGH (ref 11.5–15.5)
WBC: 8.5 10*3/uL (ref 4.0–10.5)
nRBC: 0 % (ref 0.0–0.2)

## 2020-05-09 LAB — ACETAMINOPHEN LEVEL: Acetaminophen (Tylenol), Serum: <10 ug/mL — ABNORMAL LOW (ref 10–30)

## 2020-05-09 LAB — COMPREHENSIVE METABOLIC PANEL
ALT: 29 U/L (ref 0–44)
AST: 44 U/L — ABNORMAL HIGH (ref 15–41)
Albumin: 4.2 g/dL (ref 3.5–5.0)
Alkaline Phosphatase: 66 U/L (ref 38–126)
Anion gap: 13 (ref 5–15)
BUN: 12 mg/dL (ref 6–20)
CO2: 26 mmol/L (ref 22–32)
Calcium: 8.9 mg/dL (ref 8.9–10.3)
Chloride: 108 mmol/L (ref 98–111)
Creatinine, Ser: 0.82 mg/dL (ref 0.61–1.24)
GFR, Estimated: >60 mL/min (ref >60)
Glucose, Bld: 93 mg/dL (ref 70–99)
Potassium: 4.6 mmol/L (ref 3.5–5.1)
Sodium: 147 mmol/L — ABNORMAL HIGH (ref 135–145)
Total Bilirubin: 0.7 mg/dL (ref 0.3–1.2)
Total Protein: 8.6 g/dL — ABNORMAL HIGH (ref 6.5–8.1)

## 2020-05-09 LAB — SALICYLATE LEVEL: Salicylate Lvl: <7 mg/dL — ABNORMAL LOW (ref 7.0–30.0)

## 2020-05-09 MED ORDER — IBUPROFEN 600 MG PO TABS
600.0000 mg | ORAL_TABLET | Freq: Three times a day (TID) | ORAL | Status: DC | PRN
  Administered 2020-05-11: 600 mg via ORAL
  Filled 2020-05-09: qty 1

## 2020-05-09 MED ORDER — THIAMINE HCL 100 MG/ML IJ SOLN: 100.0000 mg | Freq: Every day | INTRAMUSCULAR | Status: DC

## 2020-05-09 MED ORDER — LORAZEPAM 2 MG/ML IJ SOLN: 0.0000 mg | Freq: Two times a day (BID) | INTRAMUSCULAR | Status: DC

## 2020-05-09 MED ORDER — THIAMINE HCL 100 MG PO TABS
100.0000 mg | ORAL_TABLET | Freq: Every day | ORAL | Status: DC
  Administered 2020-05-10 – 2020-05-11 (×2): 100 mg via ORAL
  Filled 2020-05-09 (×2): qty 1

## 2020-05-09 MED ORDER — ALUM & MAG HYDROXIDE-SIMETH 200-200-20 MG/5ML PO SUSP: 30.0000 mL | Freq: Four times a day (QID) | ORAL | Status: DC | PRN

## 2020-05-09 MED ORDER — LORAZEPAM 2 MG PO TABS
0.0000 mg | ORAL_TABLET | Freq: Four times a day (QID) | ORAL | Status: DC
  Administered 2020-05-10: 2 mg via ORAL
  Filled 2020-05-09: qty 1

## 2020-05-09 MED ORDER — LORAZEPAM 2 MG/ML IJ SOLN: 0.0000 mg | Freq: Four times a day (QID) | INTRAMUSCULAR | Status: DC

## 2020-05-09 MED ORDER — LORAZEPAM 2 MG PO TABS: 0.0000 mg | ORAL_TABLET | Freq: Two times a day (BID) | ORAL | Status: DC

## 2020-05-09 NOTE — ED Triage Notes (Signed)
Pt to ED reporting he was brought to the ED via [employer] after he stated he was feeling "angry." Pt reports he had his second COVID vaccination today and then he started drinking alcohol. Pt is fearful he is having a "reaction" and reports he was told by police this could be a reaction to the vaccination. Pt denies HI but upon further question pt reports he has been having SI without self harm or attempt. Pt reports he has been compliant with medications.

## 2020-05-09 NOTE — ED Notes (Addendum)
Patient transferred from Triage to room 24H after dressing out and screening for contraband. Report received from [place], [place] including situation, background, assessment and recommendations. Pt oriented to [employer] including Q15 minute rounds as well as Engineer, drilling for their protection. Patient is alert and oriented, warm and dry in no acute distress. Patient denies SI and HI. Pt. Speaks english well to communicate with staff, Encouraged to let this nurse know if needs arise.

## 2020-05-09 NOTE — ED Notes (Signed)
Date and time results received: 05/09/20 10:29 PM   Test: Alcohol Critical Value: 379  Name of Provider Notified: Joni Fears MD

## 2020-05-09 NOTE — ED Notes (Signed)
Pts belongings, documented with Jenny Reichmann, EDT:  Black tank top black sweatshirt Brown belt [employer]  White socks Light blue boxers Camo pants

## 2020-05-09 NOTE — ED Provider Notes (Signed)
[facility] Emergency Department Provider Note  ____________________________________________  Time seen: Approximately 11:28 PM  I have reviewed the triage vital signs and the nursing notes.   HISTORY  Chief Complaint Alcohol Intoxication and Suicidal    HPI Ivan Hamilton is a 54 y.o. male with a history of hypertension hypothyroidism cirrhosis and alcohol abuse who reports suicidal ideation and requests help.  He reports that he tried to hang himself 3 days ago but was stopped by his spouse.  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  He is reports that he has auditory hallucinations of their voices crying for help.      Past Medical History:  Diagnosis Date  . Alcohol withdrawal ([place])   . B12 deficiency   . Chronic pain   . CVA (cerebral vascular accident) ([place])   . Hypertension   . Hypothyroidism   . Varicose veins      Patient Active Problem List   Diagnosis Date Noted  . Lip swelling   . Hypotension 01/10/2020  . Bipolar disorder current episode depressed ([place]) 01/08/2020  . Metabolic acidosis   . Hepatic cirrhosis ([place])   . Diverticulitis   . Transaminitis   . Syncope 07/11/2019  . Abdominal pain 07/11/2019  . Alcohol abuse 07/11/2019  . Thrombocytopenia ([place]) 07/11/2019  . Normocytic anemia 07/11/2019  . Tobacco abuse 07/11/2019  . HTN (hypertension) 07/11/2019  . CVA (cerebral vascular accident) ([place])   . Suicidal behavior 08/28/2018  . Severe recurrent major depressive disorder with psychotic features ([place]) 08/28/2018  . Alcohol intoxication ([place]) 08/28/2018  . Suicidal ideation 08/28/2018  . Duodenitis   . Gastritis without bleeding   . Hypokalemia 10/22/2017  . Intractable vomiting with nausea   . Anxiety and depression 04/10/2016  . Alcohol withdrawal delirium ([place])   . Alcohol  withdrawal ([place]) 09/02/2015  . Alcohol use disorder, severe, dependence ([place]) 09/02/2015  . 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 PTSD (post-traumatic stress disorder) 09/01/2015  . Hypothyroid 09/01/2015  . Gastric reflux 09/01/2015  . DDD (degenerative disc disease), lumbosacral 09/01/2015  . 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 Vitamin B12 deficiency 08/17/2015  . Pain of lumbar spine 05/19/2015  . Neuropathy 05/17/2015  . Varicose vein of leg 04/25/2015  . Abnormal LFTs (liver function tests) 03/31/2015  . Confusion state 03/31/2015  . Chronic pain of left lower extremity 03/31/2015     Past Surgical History:  Procedure Laterality Date  . CHOLECYSTECTOMY    . ESOPHAGOGASTRODUODENOSCOPY (EGD) WITH PROPOFOL N/A 10/24/2017   Procedure: ESOPHAGOGASTRODUODENOSCOPY (EGD) WITH PROPOFOL;  Surgeon: [name], MD;  Location: [facility] ENDOSCOPY;  Service: Endoscopy;  Laterality: N/A;  . ESOPHAGOGASTRODUODENOSCOPY (EGD) WITH PROPOFOL N/A 07/12/2019   Procedure: ESOPHAGOGASTRODUODENOSCOPY (EGD) WITH PROPOFOL;  Surgeon: Jonathon Bellows, MD;  Location: [facility] ENDOSCOPY;  Service: Gastroenterology;  Laterality: N/A;  . LEG SURGERY       Prior to Admission medications   Medication Sig Start Date End Date Taking? Authorizing Provider  amoxicillin-clavulanate (AUGMENTIN) 875-125 MG tablet Take 1 tablet by mouth every 12 (twelve) hours. 01/13/20   Loletha Grayer, MD  busPIRone (BUSPAR) 15 MG tablet Take 1 tablet (15 mg total) by mouth 2 (two) times daily. 01/13/20   Loletha Grayer, MD  FLUoxetine (PROZAC) 40 MG capsule Take 1 capsule (40 mg  total) by mouth daily. 01/13/20   Loletha Grayer, MD  folic acid (FOLVITE) 1 MG tablet Take 1 tablet (1 mg total) by mouth daily. 01/14/20   Loletha Grayer, MD  gabapentin (NEURONTIN) 600 MG tablet Take 1 tablet (600 mg total) by mouth 3 (three) times daily. 01/13/20   Loletha Grayer, MD  levothyroxine (SYNTHROID) 125 MCG tablet Take 1 tablet (125 mcg total) by mouth daily before breakfast. 01/13/20   Loletha Grayer, MD  thiamine 100 MG tablet Take 1 tablet (100 mg total) by mouth daily. 01/14/20   Loletha Grayer, MD     Allergies Patient has no known allergies.   History reviewed. No pertinent family history.  Social History Social History   Tobacco Use  . Smoking status: Never Smoker  . Smokeless tobacco: Never Used  Vaping Use  . Vaping Use: Never used  Substance Use Topics  . Alcohol use: Yes    Comment: daily- 1 pint of vodka  . Drug use: No    Review of Systems  Constitutional:   No fever or chills.  ENT:   No sore throat. No rhinorrhea. Cardiovascular:   No chest pain or syncope. Respiratory:   No dyspnea or cough. Gastrointestinal:   Negative for abdominal pain, vomiting and diarrhea.  Musculoskeletal:   Negative for focal pain or swelling All other systems reviewed and are negative except as documented above in ROS and HPI.  ____________________________________________   PHYSICAL EXAM:  VITAL SIGNS: ED Triage Vitals  Enc Vitals Group     BP 05/09/20 2137 121/69     Pulse Rate 05/09/20 2137 81     Resp 05/09/20 2137 16     Temp 05/09/20 2137 99.1 F (37.3 C)     Temp Source 05/09/20 2137 Oral     SpO2 05/09/20 2137 100 %     Weight 05/09/20 2138 137 lb (62.1 kg)     Height 05/09/20 2138 5\' 5"  (1.651 m)     Head Circumference --      Peak Flow --      Pain Score 05/09/20 2138 0     Pain Loc --      Pain Edu? --      Excl. in [place]? --     Vital signs reviewed, nursing assessments reviewed.   Constitutional:   Alert and oriented. Non-toxic appearance. Eyes:   Conjunctivae are normal. EOMI. PERRL. ENT      Head:   Normocephalic and atraumatic.      Nose:   Wearing a mask.      Mouth/Throat:   Wearing a mask.      Neck:   No meningismus. Full ROM. Hematological/Lymphatic/Immunilogical:   No cervical  lymphadenopathy. Cardiovascular:   RRR. Symmetric bilateral radial and DP pulses.  No murmurs. Cap refill less than 2 seconds. Respiratory:   Normal respiratory effort without tachypnea/retractions. Breath sounds are clear and equal bilaterally. No wheezes/rales/rhonchi. Gastrointestinal:   Soft and nontender. Non distended. There is no CVA tenderness.  No rebound, rigidity, or guarding.  Musculoskeletal:   Normal range of motion in all extremities. No joint effusions.  No lower extremity tenderness.  No edema. Neurologic:   Normal speech and language.  Motor grossly intact. No acute focal neurologic deficits are appreciated.  Skin:    Skin is warm, dry and intact. No rash noted.  No petechiae, purpura, or bullae.  ____________________________________________    LABS (pertinent positives/negatives) (all labs ordered are listed, but only abnormal results are displayed)  Labs Reviewed  COMPREHENSIVE METABOLIC PANEL - Abnormal; Notable for the following components:      Result Value   Sodium 147 (*)    Total Protein 8.6 (*)    AST 44 (*)    All other components within normal limits  ETHANOL - Abnormal; Notable for the following components:   Alcohol, Ethyl (B) 379 (*)    All other components within normal limits  SALICYLATE LEVEL - Abnormal; Notable for the following components:   Salicylate Lvl <7.5 (*)    All other components within normal limits  ACETAMINOPHEN LEVEL - Abnormal; Notable for the following components:   Acetaminophen (Tylenol), Serum <10 (*)    All other components within normal limits  CBC - Abnormal; Notable for the following components:   Hemoglobin 12.5 (*)    HCT 38.3 (*)    RDW 20.5 (*)    All other components within normal limits  URINE DRUG SCREEN, QUALITATIVE ([facility] ONLY)   ____________________________________________   EKG    ____________________________________________    RADIOLOGY  No results  found.  ____________________________________________   PROCEDURES Procedures  ____________________________________________    CLINICAL IMPRESSION / ASSESSMENT AND PLAN / ED COURSE  Medications ordered in the ED: Medications - No data to display  Pertinent labs & imaging results that were available during my care of the patient were reviewed by me and considered in my medical decision making (see chart for details).  Ivan Hamilton was evaluated in Emergency Department on 05/09/2020 for the symptoms described in the history of present illness. He was evaluated in the context of the global COVID-19 pandemic, which necessitated consideration that the patient might be at risk for infection with the SARS-CoV-2 virus that causes COVID-19. Institutional protocols and algorithms that pertain to the evaluation of patients at risk for COVID-19 are in a state of rapid change based on information released by regulatory bodies including the CDC and federal and state organizations. These policies and algorithms were followed during the patient's care in the ED.   Patient presents with SI in the setting of drinking, reports a recent suicide attempt by hanging which was aborted by family member.  Presentation concerning for depression and PTSD with patient self-medicating with alcohol versus chronic alcohol abuse and alcohol induced mood disorder.  Will IVC for safety while awaiting psychiatry evaluation.  The patient has been placed in psychiatric observation due to the need to provide a safe environment for the patient while obtaining psychiatric consultation and evaluation, as well as ongoing medical and medication management to treat the patient's condition.  The patient has been placed under full IVC at this time.       ____________________________________________   FINAL CLINICAL IMPRESSION(S) / ED DIAGNOSES    Final diagnoses:  Alcohol abuse  Suicide attempt [facility])     ED  Discharge Orders    None      Portions of this note were generated with dragon dictation software. Dictation errors may occur despite best attempts at proofreading.   Carrie Mew, MD 05/09/20 2332

## 2020-05-09 NOTE — ED Notes (Signed)
Pt reports that he has had history of SI and HI but not recently. 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

## 2020-05-10 DIAGNOSIS — F10951 Alcohol use, unspecified with alcohol-induced psychotic disorder with hallucinations: Secondary | ICD-10-CM | POA: Diagnosis not present

## 2020-05-10 MED ORDER — CHLORDIAZEPOXIDE HCL 25 MG PO CAPS
25.0000 mg | ORAL_CAPSULE | Freq: Three times a day (TID) | ORAL | Status: AC
  Administered 2020-05-10 (×2): 25 mg via ORAL
  Filled 2020-05-10 (×2): qty 1

## 2020-05-10 MED ORDER — LORAZEPAM 2 MG/ML IJ SOLN
INTRAMUSCULAR | Status: AC
  Filled 2020-05-10: qty 1

## 2020-05-10 MED ORDER — GABAPENTIN 600 MG PO TABS
600.0000 mg | ORAL_TABLET | Freq: Three times a day (TID) | ORAL | Status: DC
  Administered 2020-05-10 – 2020-05-11 (×5): 600 mg via ORAL
  Filled 2020-05-10 (×5): qty 1

## 2020-05-10 MED ORDER — LEVOTHYROXINE SODIUM 50 MCG PO TABS
125.0000 ug | ORAL_TABLET | Freq: Every day | ORAL | Status: DC
  Administered 2020-05-11: 125 ug via ORAL
  Filled 2020-05-10: qty 3

## 2020-05-10 MED ORDER — LORAZEPAM 2 MG/ML IJ SOLN
2.0000 mg | Freq: Once | INTRAMUSCULAR | Status: AC
  Administered 2020-05-10: 2 mg via INTRAVENOUS

## 2020-05-10 NOTE — ED Notes (Signed)
INVOLUNTARY/considering admitting/[facility] completed Psych assessement

## 2020-05-10 NOTE — ED Notes (Signed)
Pt given lunch tray.

## 2020-05-10 NOTE — ED Notes (Signed)
Hourly rounding completed at this time, patient currently asleep in room. No complaints, stable, and in no acute distress. Q15 minute rounds and monitoring via Rover and Officer to continue. 

## 2020-05-10 NOTE — Progress Notes (Signed)
Patient ID: Ivan Hamilton, male   DOB: 01-02-1966, 54 y.o.   MRN: [id number] Please update: I spoke to the patient's daughter who is the usual family contact.  She reports that as far as the family knows he had been sober for a couple weeks and only relapsed this weekend but despite that his mental health has been terrible recently.  Frequent talk about killing himself and other people.  Not compliant with any outpatient treatment.  Family has very valid concerns about his safety and their own.  Reinforced the believe that he needs admission once medically stabilized.  I advised the daughter that we are watching him in the emergency room so that we do not end having delirium tremens downstairs.  She was agreeable to that as well.

## 2020-05-10 NOTE — ED Notes (Signed)
Pt stated to this tech "I need to let the doctor know that tomorrow I have an appointment because I am having laser eye surgery and I need to leave today. I am fine. I feel good." MD Clapacs made aware.

## 2020-05-10 NOTE — ED Notes (Signed)
Gave pt food tray with juice. 

## 2020-05-10 NOTE — Consult Note (Signed)
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 male patient admitted with "I keep seeing people".  HPI: Patient seen chart reviewed.  Patient is known to me from many prior encounters.  Patient was interviewed with the assistance of a hospital provided Spanish language interpreter although his English is quite good.  Patient reports that he had himself brought over to the hospital yesterday because he was seeing people creeping around his house and was also having suicidal thoughts.  He says he was working out in his garden and saw some people creeping around.  He claims he then went inside and had a single shot of tequila and called 911.  When pressed for more details he admits that he had actually been drinking all day long.  He claims that he had only resumed drinking in the last couple days although when I started to walk him back through time he admitted that he had been having beer every day recently.  Patient says he was having suicidal thoughts yesterday.  Does not specify any specific means of killing himself although he said at one point he jumped off of a ladder.  He seems to be referring to an incident that happened sometime in the past though.  Patient tells me he is not seeing anyone for outpatient mental health treatment.  He gets hydroxyzine through his primary care doctor but not getting any other mental  health treatment.  Still has chronic problems with anxiety sleep mood instability and poor functioning.  Blood alcohol level on admission last night was almost 400.  Past Psychiatric History: Patient has a long history of similar presentations and carries a diagnosis of PTSD and alcohol abuse.  His PTSD is related to his service in the [employer] in [place] during which apparently he suffered pretty horrible trauma.  He also has a history of heavy drinking and has had multiple alcohol withdrawal seizures and episodes of delirium tremens.  Risk to Self: Suicidal Ideation: No-Not Currently/Within Last 6 Months Suicidal Intent: No-Not Currently/Within Last 6 Months Is patient at risk for suicide?: Yes Suicidal Plan?: No-Not Currently/Within Last 6 Months Access to Means: Yes Specify Access to Suicidal Means: "jump off a ladder or scratch myself" What has been your use of drugs/alcohol within the last 12 months?: Alcohol Abuse How many times?: 3 Other Self Harm Risks: None Triggers for Past Attempts: Other (Comment) (Past trauma) Intentional Self Injurious Behavior: None Risk to Others: Homicidal Ideation: No Thoughts of Harm to Others: No Current Homicidal Intent: No Current Homicidal Plan: No Access to Homicidal Means: No Identified Victim: None History of harm to others?: No Assessment of Violence: None Noted Violent Behavior Description: None Does patient have access to weapons?: No Criminal Charges Pending?: No Does patient have a court date: No Prior Inpatient Therapy: Prior Inpatient Therapy: Yes Prior Therapy Dates: 12/2019 Prior Therapy Facilty/Provider(s): [facility] Reason for Treatment: Alcohol Abuse, Depression, SI Prior Outpatient Therapy: Prior Outpatient Therapy:  No Does patient have an ACCT team?: No Does patient have Intensive In-House Services?  : No Does patient have [facility] services? : No Does patient have P4CC services?: No  Past Medical History:  Past Medical  History:  Diagnosis Date  . Alcohol withdrawal ([place])   . B12 deficiency   . Chronic pain   . CVA (cerebral vascular accident) ([place])   . Hypertension   . Hypothyroidism   . Varicose veins     Past Surgical History:  Procedure Laterality Date  . CHOLECYSTECTOMY    . ESOPHAGOGASTRODUODENOSCOPY (EGD) WITH PROPOFOL N/A 10/24/2017   Procedure: ESOPHAGOGASTRODUODENOSCOPY (EGD) WITH PROPOFOL;  Surgeon: [name], MD;  Location: [facility] ENDOSCOPY;  Service: Endoscopy;  Laterality: N/A;  . ESOPHAGOGASTRODUODENOSCOPY (EGD) WITH PROPOFOL N/A 07/12/2019   Procedure: ESOPHAGOGASTRODUODENOSCOPY (EGD) WITH PROPOFOL;  Surgeon: Jonathon Bellows, MD;  Location: [facility] ENDOSCOPY;  Service: Gastroenterology;  Laterality: N/A;  . LEG SURGERY     Family History: History reviewed. No pertinent family history. Family Psychiatric  History: Patient's father sounds like he had a problem with drinking as well. Social History:  Social History   Substance and Sexual Activity  Alcohol Use Yes   Comment: daily- 1 pint of vodka     Social History   Substance and Sexual Activity  Drug Use No    Social History   Socioeconomic History  . Marital status: Married    Spouse name: Not on file  . Number of children: Not on file  . Years of education: Not on file  . Highest education level: Not on file  Occupational History  . Not on file  Tobacco Use  . Smoking status: Never Smoker  . Smokeless tobacco: Never Used  Vaping Use  . Vaping Use: Never used  Substance and Sexual Activity  . Alcohol use: Yes    Comment: daily- 1 pint of vodka  . Drug use: No  . Sexual activity: Not on file  Other Topics Concern  . Not on file  Social History Narrative  . Not on file   Social Determinants of Health   Financial Resource Strain:   . Difficulty of Paying Living Expenses: Not on file  Food Insecurity:   . Worried About Charity fundraiser in the Last Year: Not on file  . Ran Out of Food in the Last Year: Not on  file  Transportation Needs:   . Lack of Transportation (Medical): Not on file  . Lack of Transportation (Non-Medical): Not on file  Physical Activity:   . Days of Exercise per Week: Not on file  . Minutes of Exercise per Session: Not on file  Stress:   . Feeling of Stress : Not on file  Social Connections:   . Frequency of Communication with Friends and Family: Not on file  . Frequency of Social Gatherings with Friends and Family: Not on file  . Attends Religious Services: Not on file  . Active Member of Clubs or Organizations: Not on file  . Attends Archivist Meetings: Not on file  . Marital Status: Not on file   Additional Social History:    Allergies:  No Known Allergies  Labs:  Results for orders placed or performed during the hospital encounter of 05/09/20 (from the past 48 hour(s))  Comprehensive metabolic panel     Status: Abnormal   Collection Time: 05/09/20  9:44 PM  Result Value Ref Range   Sodium 147 (H) 135 - 145 mmol/L   Potassium 4.6 3.5 -  5.1 mmol/L   Chloride 108 98 - 111 mmol/L   CO2 26 22 - 32 mmol/L   Glucose, Bld 93 70 - 99 mg/dL    Comment: Glucose reference range applies only to samples taken after fasting for at least 8 hours.   BUN 12 6 - 20 mg/dL   Creatinine, Ser 0.82 0.61 - 1.24 mg/dL   Calcium 8.9 8.9 - 10.3 mg/dL   Total Protein 8.6 (H) 6.5 - 8.1 g/dL   Albumin 4.2 3.5 - 5.0 g/dL   AST 44 (H) 15 - 41 U/L   ALT 29 0 - 44 U/L   Alkaline Phosphatase 66 38 - 126 U/L   Total Bilirubin 0.7 0.3 - 1.2 mg/dL   GFR, Estimated >60 >60 mL/min   Anion gap 13 5 - 15    Comment: Performed at [facility], [address] [place], [place] [address]  Ethanol     Status: Abnormal   Collection Time: 05/09/20  9:44 PM  Result Value Ref Range   Alcohol, Ethyl (B) 379 (HH) <10 mg/dL    Comment: CRITICAL RESULT CALLED TO, READ BACK BY AND VERIFIED WITH CATHY FELTS RN [id number] 05/09/20 HNM (NOTE) Lowest detectable limit for serum alcohol is 10  mg/dL.  For medical purposes only. Performed at [facility], [address] [place], [place] [address]   Salicylate level     Status: Abnormal   Collection Time: 05/09/20  9:44 PM  Result Value Ref Range   Salicylate Lvl <9.9 (L) 7.0 - 30.0 mg/dL    Comment: Performed at [facility], [address] [place], [place] [address]  Acetaminophen level     Status: Abnormal   Collection Time: 05/09/20  9:44 PM  Result Value Ref Range   Acetaminophen (Tylenol), Serum <10 (L) 10 - 30 ug/mL    Comment: (NOTE) Therapeutic concentrations vary significantly. A range of 10-30 ug/mL  may be an effective concentration for many patients. However, some  are best treated at concentrations outside of this range. Acetaminophen concentrations >150 ug/mL at 4 hours after ingestion  and >50 ug/mL at 12 hours after ingestion are often associated with  toxic reactions.  Performed at [facility], [address] [place], [place] [address]   cbc     Status: Abnormal   Collection Time: 05/09/20  9:44 PM  Result Value Ref Range   WBC 8.5 4.0 - 10.5 K/uL   RBC 4.65 4.22 - 5.81 MIL/uL   Hemoglobin 12.5 (L) 13.0 - 17.0 g/dL   HCT 38.3 (L) 39 - 52 %   MCV 82.4 80.0 - 100.0 fL   MCH 26.9 26.0 - 34.0 pg   MCHC 32.6 30.0 - 36.0 g/dL   RDW 20.5 (H) 11.5 - 15.5 %   Platelets 262 150 - 400 K/uL   nRBC 0.0 0.0 - 0.2 %    Comment: Performed at [facility], [address] [place], [place] [address]  Urine Drug Screen, Qualitative     Status: None   Collection Time: 05/09/20  9:44 PM  Result Value Ref Range   Tricyclic, Ur Screen NONE DETECTED NONE DETECTED   Amphetamines, Ur Screen NONE DETECTED NONE DETECTED   MDMA (Ecstasy)Ur Screen NONE DETECTED NONE DETECTED   Cocaine Metabolite,Ur [place] NONE DETECTED NONE DETECTED   Opiate, Ur Screen NONE DETECTED NONE DETECTED   Phencyclidine (PCP) Ur S NONE DETECTED NONE DETECTED   Cannabinoid 50 Ng, Ur  NONE DETECTED NONE  DETECTED  Barbiturates, Ur Screen NONE DETECTED NONE DETECTED   Benzodiazepine, Ur Scrn NONE DETECTED NONE DETECTED   Methadone Scn, Ur NONE DETECTED NONE DETECTED    Comment: (NOTE) Tricyclics + metabolites, urine    Cutoff 1000 ng/mL Amphetamines + metabolites, urine  Cutoff 1000 ng/mL MDMA (Ecstasy), urine              Cutoff 500 ng/mL Cocaine Metabolite, urine          Cutoff 300 ng/mL Opiate + metabolites, urine        Cutoff 300 ng/mL Phencyclidine (PCP), urine         Cutoff 25 ng/mL Cannabinoid, urine                 Cutoff 50 ng/mL Barbiturates + metabolites, urine  Cutoff 200 ng/mL Benzodiazepine, urine              Cutoff 200 ng/mL Methadone, urine                   Cutoff 300 ng/mL  The urine drug screen provides only a preliminary, unconfirmed analytical test result and should not be used for non-medical purposes. Clinical consideration and professional judgment should be applied to any positive drug screen result due to possible interfering substances. A more specific alternate chemical method must be used in order to obtain a confirmed analytical result. Gas chromatography / mass spectrometry (GC/MS) is the preferred confirm atory method. Performed at [facility], [address], [place], [place] [address]     Current Facility-Administered Medications  Medication Dose Route Frequency Provider Last Rate Last Admin  . alum & mag hydroxide-simeth (MAALOX/MYLANTA) 200-200-20 MG/5ML suspension 30 mL  30 mL Oral Q6H PRN Carrie Mew, MD      . chlordiazePOXIDE (LIBRIUM) capsule 25 mg  25 mg Oral TID Nahjae Hoeg T, MD      . ibuprofen (ADVIL) tablet 600 mg  600 mg Oral Q8H PRN Carrie Mew, MD      . Derrill Memo ON 05/11/2020] levothyroxine (SYNTHROID) tablet 125 mcg  125 mcg Oral QAC breakfast Duffy Bruce, MD      . LORazepam (ATIVAN) injection 0-4 mg  0-4 mg Intravenous Q6H Carrie Mew, MD       Or  . LORazepam (ATIVAN) tablet 0-4 mg  0-4 mg  Oral Q6H Carrie Mew, MD      . Derrill Memo ON 05/12/2020] LORazepam (ATIVAN) injection 0-4 mg  0-4 mg Intravenous Q12H Carrie Mew, MD       Or  . Derrill Memo ON 05/12/2020] LORazepam (ATIVAN) tablet 0-4 mg  0-4 mg Oral Q12H Carrie Mew, MD      . thiamine tablet 100 mg  100 mg Oral Daily Carrie Mew, MD   100 mg at 05/10/20 2841   Or  . thiamine (B-1) injection 100 mg  100 mg Intravenous Daily Carrie Mew, MD       Current Outpatient Medications  Medication Sig Dispense Refill  . ALPRAZolam (XANAX) 0.25 MG tablet Take 0.25 mg by mouth at bedtime as needed for sleep.    . busPIRone (BUSPAR) 15 MG tablet Take 1 tablet (15 mg total) by mouth 2 (two) times daily. 60 tablet 0  . FLUoxetine HCl 60 MG TABS Take 60 mg by mouth daily at 12 noon.    . hydrOXYzine (ATARAX/VISTARIL) 25 MG tablet Take 25 mg by mouth every 8 (eight) hours as needed for itching.     . levothyroxine (SYNTHROID) 125 MCG tablet Take 1  tablet (125 mcg total) by mouth daily before breakfast. 30 tablet 0  . traZODone (DESYREL) 50 MG tablet Take 100 mg by mouth at bedtime as needed for sleep.      Musculoskeletal: Strength & Muscle Tone: within normal limits Gait & Station: normal Patient leans: N/A  Psychiatric Specialty Exam: Physical Exam Vitals and nursing note reviewed.  Constitutional:      Appearance: He is well-developed.  HENT:     Head: Normocephalic and atraumatic.  Eyes:     Conjunctiva/sclera: Conjunctivae normal.     Pupils: Pupils are equal, round, and reactive to light.  Cardiovascular:     Heart sounds: Normal heart sounds.  Pulmonary:     Effort: Pulmonary effort is normal.  Abdominal:     Palpations: Abdomen is soft.  Musculoskeletal:        General: Normal range of motion.     Cervical back: Normal range of motion.  Skin:    General: Skin is warm and dry.  Neurological:     General: No focal deficit present.     Mental Status: He is alert.  Psychiatric:         Attention and Perception: Attention normal.        Mood and Affect: Mood is anxious and depressed.        Speech: Speech normal.        Behavior: Behavior is cooperative.        Thought Content: Thought content is paranoid. Thought content includes suicidal ideation. Thought content does not include suicidal plan.        Cognition and Memory: Cognition is impaired. Memory is impaired.        Judgment: Judgment is impulsive.     Review of Systems  Constitutional: Negative.   HENT: Negative.   Eyes: Negative.   Respiratory: Negative.   Cardiovascular: Negative.   Gastrointestinal: Negative.   Musculoskeletal: Negative.   Skin: Negative.   Neurological: Negative.   Psychiatric/Behavioral: Positive for hallucinations and suicidal ideas. The patient is nervous/anxious.     Blood pressure 116/68, pulse 71, temperature 98.2 F (36.8 C), temperature source Oral, resp. rate 16, height 5\' 5"  (1.651 m), weight 62.1 kg, SpO2 98 %.Body mass index is 22.8 kg/m.  General Appearance: Casual  Eye Contact:  Fair  Speech:  Clear and Coherent  Volume:  Normal  Mood:  Anxious and Dysphoric  Affect:  Congruent  Thought Process:  Coherent  Orientation:  Full (Time, Place, and Person)  Thought Content:  Rumination  Suicidal Thoughts:  Yes.  with intent/plan  Homicidal Thoughts:  No  Memory:  Immediate;   Fair Recent;   Poor Remote;   Fair  Judgement:  Fair  Insight:  Shallow  Psychomotor Activity:  Normal  Concentration:  Concentration: Fair  Recall:  [employer] of Knowledge:  Fair  Language:  Fair  Akathisia:  No  Handed:  Right  AIMS (if indicated):     Assets:  Desire for Improvement Housing Resilience Social Support  ADL's:  Impaired  Cognition:  Impaired,  Mild  Sleep:        Treatment Plan Summary: Daily contact with patient to assess and evaluate symptoms and progress in treatment, Medication management and Plan 54 year old man with a long and established mental health and  alcohol history.  Currently his behavior is calm and cooperative although he continues to endorse vague suicidal ideation.  He is not currently tremulous and not currently delirious.  My concern is that  he is still in the period of time when he is at elevated risk of alcohol withdrawal seizures and that in the next day he will enter the phase when he is at elevated risk of delirium tremens.  Either of those conditions would be unmanageable realistically on the psychiatry ward downstairs.  For this reason I recommend that we give him some Librium and keep an eye on him in the emergency room for at least a day to see how he is doing before deciding to admit him downstairs.  It does not sound like at this point he has a clear indication for medical admission but I will keep that in mind as well.  Patient reports that he understands all this and is agreeable to it.  I have tried to call his daughter who is listed as his usual point of contact and had to just leave a message for a cell phone.  Continue otherwise his thyroid medication.  We can continue his gabapentin that he has been getting for chronic pain as well.  Disposition: Recommend psychiatric Inpatient admission when medically cleared.  Alethia Berthold, MD 05/10/2020 11:14 AM

## 2020-05-10 NOTE — ED Notes (Signed)
Pt insists that he must make it to his appointment tomorrow. Reiterated to pt that he has been set for admission. Pt speaking with Dr. Weber Cooks at this time.

## 2020-05-10 NOTE — ED Notes (Signed)
Hourly rounding completed at this time, patient currently awake in room. No complaints, stable, and in no acute distress. Q15 minute rounds and monitoring via Rover and Officer to continue. °

## 2020-05-10 NOTE — ED Notes (Signed)

## 2020-05-10 NOTE — ED Notes (Signed)
Hourly rounding completed at this time, patient currently awake in hallway bed. No complaints, stable, and in no acute distress. Q15 minute rounds and monitoring via Rover and Officer to continue. 

## 2020-05-10 NOTE — ED Notes (Signed)
Pt given breakfast tray

## 2020-05-10 NOTE — ED Notes (Signed)
Hourly rounding completed at this time, patient currently awake in hallway recliner. No complaints, stable, and in no acute distress. Q15 minute rounds and monitoring via Engineer, drilling to continue.

## 2020-05-10 NOTE — BH Assessment (Signed)
Assessment Note  Ivan Hamilton is an 54 y.o. male presenting to [facility] ED under IVC. Per triage note Pt to ED reporting he was brought to the ED via BPD after he stated he was feeling "angry." Pt reports he had his second COVID vaccination today and then he started drinking alcohol. Pt is fearful he is having a "reaction" and reports he was told by police this could be a reaction to the vaccination. Pt denies HI but upon further question pt reports he has been having SI without self harm or attempt. Pt reports he has been compliant with medications. During assessment patient was alert and oriented x4, calm and cooperative, mood was pleasant. 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 [place] "it's something about the [employer], I can't sleep with my ex wife I have to sleep on the couch because it's hard for me to sleep." Patient reports experiencing AH and VH, in regards to his AH "the voices say my name or tell me to hurt myself." In regards to his VH "I see people that nobody else sees." Patient reports currently being on medication but reports "I think I need a higher dose because it doesn't help." Patient reported SI with a plan today "to jump off a ladder or scratch myself." Patient reports attempts in the past "I had a gun one time but it wasn't loaded and I tried to put the pillow on my face so that I couldn't breathe." Patient had reported to EDP that he tried to hang himself 3 days go but his spouse stopped him. Patient reports lack of sleep and no appetite. Patient reports alcohol use but seems to be downplaying his use, when asked how much he drinks he reported "I only took a shot today." When patient was told his BAL was high he then reported "before I called the police I had 1/2 bottle." Patient current BAL is 379.  Patient denies current SI/HI/AH/VH and does not appear to be responding to any internal or external stimuli.  Per Psyc NP Lindon Romp patient is recommended for Inpatient Hospitalization   Diagnosis: Major Depressive Disorder, recurrent episode, severe with psychotic features by hx. Alcohol Use Disorder, Severe  Past Medical History:  Past Medical History:  Diagnosis Date  . Alcohol withdrawal ([place])   . B12 deficiency   . Chronic pain   . CVA (cerebral vascular accident) ([place])   . Hypertension   . Hypothyroidism   . Varicose veins     Past Surgical History:  Procedure Laterality Date  . CHOLECYSTECTOMY    . ESOPHAGOGASTRODUODENOSCOPY (EGD) WITH PROPOFOL N/A 10/24/2017   Procedure: ESOPHAGOGASTRODUODENOSCOPY (EGD) WITH PROPOFOL;  Surgeon: [name], MD;  Location: [facility] ENDOSCOPY;  Service: Endoscopy;  Laterality: N/A;  . ESOPHAGOGASTRODUODENOSCOPY (EGD) WITH PROPOFOL N/A 07/12/2019   Procedure: ESOPHAGOGASTRODUODENOSCOPY (EGD) WITH PROPOFOL;  Surgeon: Jonathon Bellows, MD;  Location: [facility] ENDOSCOPY;  Service: Gastroenterology;  Laterality: N/A;  . LEG SURGERY      Family History: History reviewed. No pertinent family history.  Social History:  reports that he has never smoked. He has never used smokeless tobacco. He reports current alcohol use. He reports that he does not use drugs.  Additional Social History:  Alcohol / Drug Use Pain Medications: See MAR Prescriptions: See MAR Over the Counter:  See MAR History of alcohol / drug use?: Yes Substance #1 Name of Substance 1: Alcohol  CIWA: CIWA-Ar BP: 121/69 Pulse Rate: 81 Nausea and Vomiting: no nausea and no vomiting Tactile Disturbances: none Tremor: no tremor Auditory Disturbances: not present Paroxysmal Sweats: no sweat visible Visual Disturbances: not present Anxiety: no anxiety, at ease Headache, Fullness in Head: none present Agitation: normal activity Orientation and Clouding of Sensorium: oriented and can do  serial additions CIWA-Ar Total: 0 COWS:    Allergies: No Known Allergies  Home Medications: (Not in a hospital admission)   OB/GYN Status:  No LMP for male patient.  General Assessment Data Location of Assessment: [facility] ED TTS Assessment: In system Is this a Tele or Face-to-Face Assessment?: Face-to-Face Is this an Initial Assessment or a Re-assessment for this encounter?: Initial Assessment Patient Accompanied by:: N/A Language Other than English: Yes What is your preferred language: Spanish Living Arrangements: Other (Comment) What gender do you identify as?: Male Marital status: Divorced Pregnancy Status: No Living Arrangements: Spouse/significant other, Other relatives, Non-relatives/Friends Can pt return to current living arrangement?: Yes Admission Status: Involuntary Petitioner: ED Attending Is patient capable of signing voluntary admission?: No Referral Source: Other Insurance type: Medicare  Medical Screening Exam ([place]) Medical Exam completed: Yes  Crisis Care Plan Living Arrangements: Spouse/significant other, Other relatives, Non-relatives/Friends Legal Guardian: Other: (Self) Name of Psychiatrist: None Name of Therapist: None  Education Status Is patient currently in school?: No Is the patient employed, unemployed or receiving disability?: Unemployed  Risk to self with the past 6 months Suicidal Ideation: No-Not Currently/Within Last 6 Months Has patient been a risk to self within the past 6 months prior to admission? : Yes Suicidal Intent: No-Not Currently/Within Last 6 Months Has patient had any suicidal intent within the past 6 months prior to admission? : Yes Is patient at risk for suicide?: Yes Suicidal Plan?: No-Not Currently/Within Last 6 Months Has patient had any suicidal plan within the past 6 months prior to admission? : Yes Access to Means: Yes Specify Access to Suicidal Means: "jump off a ladder or scratch myself" What has  been your use of drugs/alcohol within the last 12 months?: Alcohol Abuse Previous Attempts/Gestures: Yes How many times?: 3 Other Self Harm Risks: None Triggers for Past Attempts: Other (Comment) (Past trauma) Intentional Self Injurious Behavior: None Family Suicide History: No Recent stressful life event(s): Trauma (Comment), Other (Comment) (Witnessing trauma during the war) Persecutory voices/beliefs?: No Depression: Yes Depression Symptoms: Isolating, Fatigue, Loss of interest in usual pleasures, Feeling worthless/self pity Substance abuse history and/or treatment for substance abuse?: Yes Suicide prevention information given to non-admitted patients: Not applicable  Risk to Others within the past 6 months Homicidal Ideation: No Does patient have any lifetime risk of violence toward others beyond the six months prior to admission? : No Thoughts of Harm to Others: No Current Homicidal Intent: No Current Homicidal Plan: No Access to Homicidal Means: No Identified Victim: None History of harm to others?: No Assessment of Violence: None Noted Violent Behavior Description: None Does patient have access to weapons?: No Criminal Charges Pending?: No Does patient have a court date: No Is patient on probation?: No  Psychosis Hallucinations: None noted (None currently, but patient has a history of AH and VH) Delusions: None noted  Mental Status Report Appearance/Hygiene: In scrubs Eye Contact: Good Motor Activity: Freedom of movement Speech: Logical/coherent Level of Consciousness: Alert Mood: Pleasant Affect: Appropriate to circumstance Anxiety Level: None Thought Processes: Coherent Judgement: Partial Orientation:  Person, Place, Time, Situation, Appropriate for developmental age Obsessive Compulsive Thoughts/Behaviors: None  Cognitive Functioning Concentration: Normal Memory: Recent Intact, Remote Intact Is patient IDD: No Insight: Fair Impulse Control:  Poor Appetite: Poor Have you had any weight changes? : No Change Sleep: Decreased Total Hours of Sleep: 0 Vegetative Symptoms: None  ADLScreening [facility] Assessment Services) Patient's cognitive ability adequate to safely complete daily activities?: Yes Patient able to express need for assistance with ADLs?: Yes Independently performs ADLs?: Yes (appropriate for developmental age)  Prior Inpatient Therapy Prior Inpatient Therapy: Yes Prior Therapy Dates: 12/2019 Prior Therapy Facilty/Provider(s): [facility] Reason for Treatment: Alcohol Abuse, Depression, SI  Prior Outpatient Therapy Prior Outpatient Therapy: No Does patient have an ACCT team?: No Does patient have Intensive In-House Services?  : No Does patient have [facility] services? : No Does patient have P4CC services?: No  ADL Screening (condition at time of admission) Patient's cognitive ability adequate to safely complete daily activities?: Yes Is the patient deaf or have difficulty hearing?: No Does the patient have difficulty seeing, even when wearing glasses/contacts?: No Does the patient have difficulty concentrating, remembering, or making decisions?: No Patient able to express need for assistance with ADLs?: Yes Does the patient have difficulty dressing or bathing?: No Independently performs ADLs?: Yes (appropriate for developmental age) Does the patient have difficulty walking or climbing stairs?: No Weakness of Legs: None Weakness of Arms/Hands: None  Home Assistive Devices/Equipment Home Assistive Devices/Equipment: None  Therapy Consults (therapy consults require a physician order) PT Evaluation Needed: No OT Evalulation Needed: No SLP Evaluation Needed: No Abuse/Neglect Assessment (Assessment to be complete while patient is alone) Abuse/Neglect Assessment Can Be Completed: Yes Physical Abuse: Denies Verbal Abuse: Denies Sexual Abuse: Denies Exploitation of patient/patient's resources: Denies Self-Neglect:  Denies Values / Beliefs Cultural Requests During Hospitalization: None Spiritual Requests During Hospitalization: None Consults Spiritual Care Consult Needed: No Transition of Care Team Consult Needed: No Advance Directives (For Healthcare) Does Patient Have a Medical Advance Directive?: No Would patient like information on creating a medical advance directive?: No - Patient declined          Disposition: Per Psyc NP Lindon Romp patient is recommended for Inpatient Hospitalization  Disposition Initial Assessment Completed for this Encounter: Yes  On Site Evaluation by:   Reviewed with Physician:    Leonie Douglas MS LCASA 05/10/2020 1:52 AM

## 2020-05-10 NOTE — ED Notes (Signed)
Pt suddenly becomes loud in hallway demanding clothing and stating he is going to leave and go home. Pt states to security to call the police to take him home and that he is an [place] citizen and does not have to stay here. Pt is educated on IVC status and plan of care and pt states that hospital thinks he is "just a dumb [place]." Pt agrees to go to room 22 instead of hallway and expresses desire to speak to someone and for medication for his anxiety. MD notified

## 2020-05-10 NOTE — ED Notes (Signed)
Resumed care from [place], [place]. Pt resting, nad noted. Will continue to monitor.

## 2020-05-10 NOTE — BH Assessment (Signed)
This Probation officer staffed patient with [employer] Nurse Matt, patient's BAL is currently 379. Patient will need to be monitored overnight in the ED and reviewed with BMU tomorrow 05/10/20.

## 2020-05-10 NOTE — ED Notes (Signed)
Pt given supplies to shower. Pt is able to shower independently.

## 2020-05-11 ENCOUNTER — Inpatient Hospital Stay
Admission: AD | Admit: 2020-05-11 | Discharge: 2020-05-18 | DRG: 885 | Disposition: A | Discharge status: Home / Self Care | Payer: Medicare HMO | Source: Intra-hospital | Attending: Behavioral Health | Admitting: Behavioral Health

## 2020-05-11 DIAGNOSIS — K2980 Duodenitis without bleeding: Secondary | ICD-10-CM | POA: Diagnosis present

## 2020-05-11 DIAGNOSIS — Z79899 Other long term (current) drug therapy: Secondary | ICD-10-CM | POA: Diagnosis not present

## 2020-05-11 DIAGNOSIS — Z653 Problems related to other legal circumstances: Secondary | ICD-10-CM | POA: Diagnosis not present

## 2020-05-11 DIAGNOSIS — Z811 Family history of alcohol abuse and dependence: Secondary | ICD-10-CM

## 2020-05-11 DIAGNOSIS — Z20822 Contact with and (suspected) exposure to covid-19: Secondary | ICD-10-CM | POA: Diagnosis not present

## 2020-05-11 DIAGNOSIS — F10951 Alcohol use, unspecified with alcohol-induced psychotic disorder with hallucinations: Secondary | ICD-10-CM | POA: Diagnosis not present

## 2020-05-11 DIAGNOSIS — R52 Pain, unspecified: Secondary | ICD-10-CM

## 2020-05-11 DIAGNOSIS — F10129 Alcohol abuse with intoxication, unspecified: Secondary | ICD-10-CM | POA: Diagnosis not present

## 2020-05-11 DIAGNOSIS — G4709 Other insomnia: Secondary | ICD-10-CM | POA: Diagnosis present

## 2020-05-11 DIAGNOSIS — I10 Essential (primary) hypertension: Secondary | ICD-10-CM | POA: Diagnosis present

## 2020-05-11 DIAGNOSIS — G8929 Other chronic pain: Secondary | ICD-10-CM | POA: Diagnosis present

## 2020-05-11 DIAGNOSIS — K219 Gastro-esophageal reflux disease without esophagitis: Secondary | ICD-10-CM | POA: Diagnosis not present

## 2020-05-11 DIAGNOSIS — F10239 Alcohol dependence with withdrawal, unspecified: Secondary | ICD-10-CM | POA: Diagnosis present

## 2020-05-11 DIAGNOSIS — K7010 Alcoholic hepatitis without ascites: Secondary | ICD-10-CM | POA: Diagnosis not present

## 2020-05-11 DIAGNOSIS — R1011 Right upper quadrant pain: Secondary | ICD-10-CM | POA: Diagnosis present

## 2020-05-11 DIAGNOSIS — Z79890 Hormone replacement therapy (postmenopausal): Secondary | ICD-10-CM | POA: Diagnosis not present

## 2020-05-11 DIAGNOSIS — F1024 Alcohol dependence with alcohol-induced mood disorder: Secondary | ICD-10-CM | POA: Diagnosis present

## 2020-05-11 DIAGNOSIS — F333 Major depressive disorder, recurrent, severe with psychotic symptoms: Secondary | ICD-10-CM | POA: Diagnosis not present

## 2020-05-11 DIAGNOSIS — Z9049 Acquired absence of other specified parts of digestive tract: Secondary | ICD-10-CM

## 2020-05-11 DIAGNOSIS — Z7141 Alcohol abuse counseling and surveillance of alcoholic: Secondary | ICD-10-CM | POA: Diagnosis not present

## 2020-05-11 DIAGNOSIS — F4310 Post-traumatic stress disorder, unspecified: Secondary | ICD-10-CM | POA: Diagnosis not present

## 2020-05-11 DIAGNOSIS — R197 Diarrhea, unspecified: Secondary | ICD-10-CM | POA: Diagnosis not present

## 2020-05-11 DIAGNOSIS — Z8673 Personal history of transient ischemic attack (TIA), and cerebral infarction without residual deficits: Secondary | ICD-10-CM

## 2020-05-11 DIAGNOSIS — I639 Cerebral infarction, unspecified: Secondary | ICD-10-CM | POA: Diagnosis present

## 2020-05-11 DIAGNOSIS — F1010 Alcohol abuse, uncomplicated: Secondary | ICD-10-CM | POA: Diagnosis present

## 2020-05-11 DIAGNOSIS — R45851 Suicidal ideations: Secondary | ICD-10-CM | POA: Diagnosis present

## 2020-05-11 DIAGNOSIS — R440 Auditory hallucinations: Secondary | ICD-10-CM | POA: Diagnosis not present

## 2020-05-11 DIAGNOSIS — K7460 Unspecified cirrhosis of liver: Secondary | ICD-10-CM | POA: Diagnosis not present

## 2020-05-11 DIAGNOSIS — Z79891 Long term (current) use of opiate analgesic: Secondary | ICD-10-CM | POA: Diagnosis not present

## 2020-05-11 DIAGNOSIS — K7689 Other specified diseases of liver: Secondary | ICD-10-CM | POA: Diagnosis not present

## 2020-05-11 DIAGNOSIS — F1020 Alcohol dependence, uncomplicated: Secondary | ICD-10-CM | POA: Diagnosis present

## 2020-05-11 DIAGNOSIS — R7401 Elevation of levels of liver transaminase levels: Secondary | ICD-10-CM | POA: Diagnosis present

## 2020-05-11 DIAGNOSIS — F4312 Post-traumatic stress disorder, chronic: Secondary | ICD-10-CM | POA: Diagnosis present

## 2020-05-11 DIAGNOSIS — E039 Hypothyroidism, unspecified: Secondary | ICD-10-CM | POA: Diagnosis present

## 2020-05-11 DIAGNOSIS — K7030 Alcoholic cirrhosis of liver without ascites: Secondary | ICD-10-CM | POA: Diagnosis not present

## 2020-05-11 LAB — RESPIRATORY PANEL BY RT PCR (FLU A&B, COVID)
Influenza A by PCR: NEGATIVE
Influenza B by PCR: NEGATIVE
SARS Coronavirus 2 by RT PCR: NEGATIVE

## 2020-05-11 MED ORDER — MAGNESIUM HYDROXIDE 400 MG/5ML PO SUSP: 30.0000 mL | Freq: Every day | ORAL | Status: DC | PRN

## 2020-05-11 MED ORDER — LEVOTHYROXINE SODIUM 50 MCG PO TABS
125.0000 ug | ORAL_TABLET | Freq: Every day | ORAL | Status: DC
  Administered 2020-05-12 – 2020-05-18 (×7): 125 ug via ORAL
  Filled 2020-05-11 (×7): qty 3

## 2020-05-11 MED ORDER — THIAMINE HCL 100 MG PO TABS
100.0000 mg | ORAL_TABLET | Freq: Every day | ORAL | Status: DC
  Administered 2020-05-12 – 2020-05-18 (×7): 100 mg via ORAL
  Filled 2020-05-11 (×7): qty 1

## 2020-05-11 MED ORDER — ALUM & MAG HYDROXIDE-SIMETH 200-200-20 MG/5ML PO SUSP: 30.0000 mL | Freq: Four times a day (QID) | ORAL | Status: DC | PRN

## 2020-05-11 MED ORDER — IBUPROFEN 600 MG PO TABS
600.0000 mg | ORAL_TABLET | Freq: Three times a day (TID) | ORAL | Status: DC | PRN
  Administered 2020-05-15 (×2): 600 mg via ORAL
  Filled 2020-05-11 (×2): qty 1

## 2020-05-11 MED ORDER — GABAPENTIN 600 MG PO TABS
600.0000 mg | ORAL_TABLET | Freq: Three times a day (TID) | ORAL | Status: DC
  Administered 2020-05-12 – 2020-05-18 (×20): 600 mg via ORAL
  Filled 2020-05-11 (×20): qty 1

## 2020-05-11 MED ORDER — LEVOTHYROXINE SODIUM 50 MCG PO TABS: 125.0000 ug | ORAL_TABLET | Freq: Every day | ORAL | Status: DC

## 2020-05-11 MED ORDER — LORAZEPAM 2 MG PO TABS
0.0000 mg | ORAL_TABLET | Freq: Two times a day (BID) | ORAL | Status: AC
  Administered 2020-05-12 – 2020-05-13 (×2): 2 mg via ORAL
  Filled 2020-05-11 (×2): qty 1

## 2020-05-11 MED ORDER — TRAZODONE HCL 100 MG PO TABS
100.0000 mg | ORAL_TABLET | Freq: Every evening | ORAL | Status: DC | PRN
  Administered 2020-05-12 – 2020-05-17 (×4): 100 mg via ORAL
  Filled 2020-05-11 (×5): qty 1

## 2020-05-11 MED ORDER — LORAZEPAM 2 MG/ML IJ SOLN
0.0000 mg | Freq: Four times a day (QID) | INTRAMUSCULAR | Status: AC
  Administered 2020-05-12: 2 mg via INTRAVENOUS

## 2020-05-11 MED ORDER — ALUM & MAG HYDROXIDE-SIMETH 200-200-20 MG/5ML PO SUSP
30.0000 mL | ORAL | Status: DC | PRN
  Administered 2020-05-15 – 2020-05-16 (×2): 30 mL via ORAL
  Filled 2020-05-11 (×2): qty 30

## 2020-05-11 MED ORDER — FLUOXETINE HCL 20 MG PO CAPS
60.0000 mg | ORAL_CAPSULE | Freq: Every day | ORAL | Status: DC
  Administered 2020-05-12 – 2020-05-14 (×3): 60 mg via ORAL
  Filled 2020-05-11 (×3): qty 3

## 2020-05-11 MED ORDER — THIAMINE HCL 100 MG/ML IJ SOLN: 100.0000 mg | Freq: Every day | INTRAMUSCULAR | Status: DC

## 2020-05-11 MED ORDER — HYDROXYZINE HCL 25 MG PO TABS
25.0000 mg | ORAL_TABLET | Freq: Three times a day (TID) | ORAL | Status: DC | PRN
  Administered 2020-05-12 – 2020-05-17 (×7): 25 mg via ORAL
  Filled 2020-05-11 (×8): qty 1

## 2020-05-11 MED ORDER — LORAZEPAM 2 MG PO TABS
0.0000 mg | ORAL_TABLET | Freq: Four times a day (QID) | ORAL | Status: AC
  Filled 2020-05-11: qty 1

## 2020-05-11 MED ORDER — MELATONIN 5 MG PO TABS
5.0000 mg | ORAL_TABLET | Freq: Every day | ORAL | Status: DC
  Administered 2020-05-11: 5 mg via ORAL
  Filled 2020-05-11 (×2): qty 1

## 2020-05-11 MED ORDER — ACETAMINOPHEN 325 MG PO TABS
650.0000 mg | ORAL_TABLET | Freq: Four times a day (QID) | ORAL | Status: DC | PRN
  Administered 2020-05-13 – 2020-05-14 (×2): 650 mg via ORAL
  Filled 2020-05-11 (×2): qty 2

## 2020-05-11 MED ORDER — HYDROXYZINE HCL 25 MG PO TABS
25.0000 mg | ORAL_TABLET | Freq: Three times a day (TID) | ORAL | Status: DC | PRN
  Administered 2020-05-11: 25 mg via ORAL
  Filled 2020-05-11: qty 1

## 2020-05-11 MED ORDER — LORAZEPAM 2 MG/ML IJ SOLN: 0.0000 mg | Freq: Two times a day (BID) | INTRAMUSCULAR | Status: AC

## 2020-05-11 NOTE — ED Notes (Signed)
Patient is calm and cooperative. He is transferred to [facility] via NT and officer. Patient belongings are taken to BMU. IVC paper work send with patient. Report given to [facility]. No issues

## 2020-05-11 NOTE — ED Notes (Signed)
TTS at bedside. 

## 2020-05-11 NOTE — ED Notes (Signed)
Pt given meal tray.

## 2020-05-11 NOTE — ED Notes (Signed)
Pt given lunch tray.

## 2020-05-11 NOTE — ED Notes (Signed)
This RN had a long conversation with pt about drinking alcohol. Pt talking about how drinking makes him depressed but he cannot stop. Pt is not SI now. Pt talks about having an eye appointment for surgery today and is wondering if he can get out for it. Pt also talks about how he has appointments for therapy x3 a week and how he thinks that with the medication will work. Pt talks a lot about personal responsibility and how it is his choice to stop drinking.

## 2020-05-11 NOTE — ED Provider Notes (Signed)
Emergency Medicine Observation Re-evaluation Note  Ivan Hamilton is a 54 y.o. male, seen on rounds today.  Pt initially presented to the ED for complaints of Alcohol Intoxication and Suicidal Currently, the patient is resting, voices no medical complaints.  Physical Exam  BP 116/68   Pulse 71   Temp 98.2 F (36.8 C) (Oral)   Resp 16   Ht 5\' 5"  (1.651 m)   Wt 62.1 kg   SpO2 98%   BMI 22.80 kg/m  Physical Exam General: Resting in no acute distress Cardiac: No cyanosis Lungs: Equal rise and fall Psych: Not agitated  ED Course / MDM  EKG:    I have reviewed the labs performed to date as well as medications administered while in observation.  Recent changes in the last 24 hours include no changes overnight.  Plan  Current plan is for psychiatric disposition. Patient is under full IVC at this time.   Paulette Blanch, MD 05/11/20 [phone]

## 2020-05-11 NOTE — ED Notes (Signed)
Report given to [facility] RN

## 2020-05-11 NOTE — ED Notes (Signed)
Hourly rounding reveals patient in room. No complaints, stable, in no acute distress. Q15 minute rounds and monitoring via Rover and Officer to continue.   

## 2020-05-11 NOTE — Consult Note (Signed)
[place] [facility]iat[facility] Consult   Reason for Consult: Follow-up consult review for this 54 year old man with PTSD depression and alcohol abuse.  On reevaluation today his vital signs have stabilized.  He is not showing any signs of delirium.  He is alert and oriented and medically stable.  Patient admits that he has been more depressed and having hallucinations recently. 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) Active Problems:   PTSD (post-traumatic stress disorder)   Hypothyroid   Gastric reflux   Alcohol withdrawal ([place])   Alcohol use disorder, severe, dependence ([place])   Total Time spent with patient: 30 minutes  Subjective:   Ivan Hamilton is a 54 y.o. male patient admitted with "I am seeing things".  HPI: See previous note.  This gentleman with a long history of PTSD and alcohol abuse presented to the hospital with suicidal ideation and visual hallucinations in the context of intoxication.  Patient and his family both report that even when he is staying off of alcohol his mood and his hallucinations and suicidal thoughts been getting worse and he is not following up with any outpatient treatment.  Past Psychiatric History: Past history of both mental health problems and substance abuse.  Prior alcohol problems at times complicated by withdrawal  Risk to Self: Suicidal Ideation: No-Not Currently/Within Last 6 Months Suicidal Intent: No-Not Currently/Within Last 6 Months Is patient at risk for suicide?: Yes Suicidal Plan?: No-Not Currently/Within Last 6 Months Access to Means: Yes Specify Access to Suicidal Means: "jump off a ladder or scratch myself" What has been your use of drugs/alcohol within the last 12 months?: Alcohol Abuse How many times?: 3 Other Self Harm Risks: None Triggers for Past Attempts: Other (Comment)  (Past trauma) Intentional Self Injurious Behavior: None Risk to Others: Homicidal Ideation: No Thoughts of Harm to Others: No Current Homicidal Intent: No Current Homicidal Plan: No Access to Homicidal Means: No Identified Victim: None History of harm to others?: No Assessment of Violence: None Noted Violent Behavior Description: None Does patient have access to weapons?: No Criminal Charges Pending?: No Does patient have a court date: No Prior Inpatient Therapy: Prior Inpatient Therapy: Yes Prior Therapy Dates: 12/2019 Prior Therapy Facilty/Provider(s): [facility] Reason for Treatment: Alcohol Abuse, Depression, SI Prior Outpatient Therapy: Prior Outpatient Therapy: No Does patient have an ACCT team?: No Does patient have Intensive In-House Services?  : No Does patient have [facility] services? : No Does patient have P4CC services?: No  Past Medical History:  Past Medical History:  Diagnosis Date  . Alcohol withdrawal ([place])   . B12 deficiency   . Chronic pain   . CVA (cerebral vascular accident) ([place])   . Hypertension   . Hypothyroidism   . Varicose veins     Past Surgical History:  Procedure Laterality Date  . CHOLECYSTECTOMY    . ESOPHAGOGASTRODUODENOSCOPY (EGD) WITH PROPOFOL N/A 10/24/2017   Procedure: ESOPHAGOGASTRODUODENOSCOPY (EGD) WITH PROPOFOL;  Surgeon: [name], MD;  Location: [facility] ENDOSCOPY;  Service: Endoscopy;  Laterality: N/A;  . ESOPHAGOGASTRODUODENOSCOPY (EGD) WITH PROPOFOL N/A 07/12/2019   Procedure: ESOPHAGOGASTRODUODENOSCOPY (EGD) WITH PROPOFOL;  Surgeon: Jonathon Bellows, MD;  Location: [facility] ENDOSCOPY;  Service: Gastroenterology;  Laterality: N/A;  . LEG SURGERY     Family History: History reviewed. No pertinent family history. Family Psychiatric  History: See previous.  Likely a family history of alcohol abuse Social History:  Social History   Substance and  Sexual Activity  Alcohol Use Yes   Comment: daily- 1 pint of vodka     Social History    Substance and Sexual Activity  Drug Use No    Social History   Socioeconomic History  . Marital status: Married    Spouse name: Not on file  . Number of children: Not on file  . Years of education: Not on file  . Highest education level: Not on file  Occupational History  . Not on file  Tobacco Use  . Smoking status: Never Smoker  . Smokeless tobacco: Never Used  Vaping Use  . Vaping Use: Never used  Substance and Sexual Activity  . Alcohol use: Yes    Comment: daily- 1 pint of vodka  . Drug use: No  . Sexual activity: Not on file  Other Topics Concern  . Not on file  Social History Narrative  . Not on file   Social Determinants of Health   Financial Resource Strain:   . Difficulty of Paying Living Expenses: Not on file  Food Insecurity:   . Worried About Charity fundraiser in the Last Year: Not on file  . Ran Out of Food in the Last Year: Not on file  Transportation Needs:   . Lack of Transportation (Medical): Not on file  . Lack of Transportation (Non-Medical): Not on file  Physical Activity:   . Days of Exercise per Week: Not on file  . Minutes of Exercise per Session: Not on file  Stress:   . Feeling of Stress : Not on file  Social Connections:   . Frequency of Communication with Friends and Family: Not on file  . Frequency of Social Gatherings with Friends and Family: Not on file  . Attends Religious Services: Not on file  . Active Member of Clubs or Organizations: Not on file  . Attends Archivist Meetings: Not on file  . Marital Status: Not on file   Additional Social History:    Allergies:  No Known Allergies  Labs:  Results for orders placed or performed during the hospital encounter of 05/09/20 (from the past 48 hour(s))  Respiratory Panel by RT PCR (Flu A&B, Covid) - Nasopharyngeal Swab     Status: None   Collection Time: 05/09/20  9:17 PM   Specimen: Nasopharyngeal Swab  Result Value Ref Range   SARS Coronavirus 2 by RT PCR  NEGATIVE NEGATIVE    Comment: (NOTE) SARS-CoV-2 target nucleic acids are NOT DETECTED.  The SARS-CoV-2 RNA is generally detectable in upper respiratoy specimens during the acute phase of infection. The lowest concentration of SARS-CoV-2 viral copies this assay can detect is 131 copies/mL. A negative result does not preclude SARS-Cov-2 infection and should not be used as the sole basis for treatment or other patient management decisions. A negative result may occur with  improper specimen collection/handling, submission of specimen other than nasopharyngeal swab, presence of viral mutation(s) within the areas targeted by this assay, and inadequate number of viral copies (<131 copies/mL). A negative result must be combined with clinical observations, patient history, and epidemiological information. The expected result is Negative.  Fact Sheet for Patients:  PinkCheek.be  Fact Sheet for Healthcare Providers:  GravelBags.it  This test is no t yet approved or cleared by the [place] FDA and  has been authorized for detection and/or diagnosis of SARS-CoV-2 by FDA under an Emergency Use Authorization (EUA). This EUA will remain  in effect (meaning this test can be used) for  the duration of the COVID-19 declaration under Section 564(b)(1) of the Act, 21 U.S.C. section 360bbb-3(b)(1), unless the authorization is terminated or revoked sooner.     Influenza A by PCR NEGATIVE NEGATIVE   Influenza B by PCR NEGATIVE NEGATIVE    Comment: (NOTE) The Xpert Xpress SARS-CoV-2/FLU/RSV assay is intended as an aid in  the diagnosis of influenza from Nasopharyngeal swab specimens and  should not be used as a sole basis for treatment. Nasal washings and  aspirates are unacceptable for Xpert Xpress SARS-CoV-2/FLU/RSV  testing.  Fact Sheet for Patients: PinkCheek.be  Fact Sheet for Healthcare  Providers: GravelBags.it  This test is not yet approved or cleared by the [place] FDA and  has been authorized for detection and/or diagnosis of SARS-CoV-2 by  FDA under an Emergency Use Authorization (EUA). This EUA will remain  in effect (meaning this test can be used) for the duration of the  Covid-19 declaration under Section 564(b)(1) of the Act, 21  U.S.C. section 360bbb-3(b)(1), unless the authorization is  terminated or revoked. Performed at [facility], [address] [place], [place] [address]   Comprehensive metabolic panel     Status: Abnormal   Collection Time: 05/09/20  9:44 PM  Result Value Ref Range   Sodium 147 (H) 135 - 145 mmol/L   Potassium 4.6 3.5 - 5.1 mmol/L   Chloride 108 98 - 111 mmol/L   CO2 26 22 - 32 mmol/L   Glucose, Bld 93 70 - 99 mg/dL    Comment: Glucose reference range applies only to samples taken after fasting for at least 8 hours.   BUN 12 6 - 20 mg/dL   Creatinine, Ser 0.82 0.61 - 1.24 mg/dL   Calcium 8.9 8.9 - 10.3 mg/dL   Total Protein 8.6 (H) 6.5 - 8.1 g/dL   Albumin 4.2 3.5 - 5.0 g/dL   AST 44 (H) 15 - 41 U/L   ALT 29 0 - 44 U/L   Alkaline Phosphatase 66 38 - 126 U/L   Total Bilirubin 0.7 0.3 - 1.2 mg/dL   GFR, Estimated >60 >60 mL/min   Anion gap 13 5 - 15    Comment: Performed at [facility], [address] [place], [place] [address]  Ethanol     Status: Abnormal   Collection Time: 05/09/20  9:44 PM  Result Value Ref Range   Alcohol, Ethyl (B) 379 (HH) <10 mg/dL    Comment: CRITICAL RESULT CALLED TO, READ BACK BY AND VERIFIED WITH CATHY FELTS RN 2228 05/09/20 HNM (NOTE) Lowest detectable limit for serum alcohol is 10 mg/dL.  For medical purposes only. Performed at [facility], [address] [place], [place] [address]   Salicylate level     Status: Abnormal   Collection Time: 05/09/20  9:44 PM  Result Value Ref Range   Salicylate Lvl <2.7 (L) 7.0 - 30.0  mg/dL    Comment: Performed at [facility], [address] [place], [place] [address]  Acetaminophen level     Status: Abnormal   Collection Time: 05/09/20  9:44 PM  Result Value Ref Range   Acetaminophen (Tylenol), Serum <10 (L) 10 - 30 ug/mL    Comment: (NOTE) Therapeutic concentrations vary significantly. A range of 10-30 ug/mL  may be an effective concentration for many patients. However, some  are best treated at concentrations outside of this range. Acetaminophen concentrations >150 ug/mL at 4 hours after ingestion  and >50 ug/mL at 12 hours after ingestion are  often associated with  toxic reactions.  Performed at [facility], [address] [place], [place] [address]   cbc     Status: Abnormal   Collection Time: 05/09/20  9:44 PM  Result Value Ref Range   WBC 8.5 4.0 - 10.5 K/uL   RBC 4.65 4.22 - 5.81 MIL/uL   Hemoglobin 12.5 (L) 13.0 - 17.0 g/dL   HCT 38.3 (L) 39 - 52 %   MCV 82.4 80.0 - 100.0 fL   MCH 26.9 26.0 - 34.0 pg   MCHC 32.6 30.0 - 36.0 g/dL   RDW 20.5 (H) 11.5 - 15.5 %   Platelets 262 150 - 400 K/uL   nRBC 0.0 0.0 - 0.2 %    Comment: Performed at [facility], [address] [place], [place] [address]  Urine Drug Screen, Qualitative     Status: None   Collection Time: 05/09/20  9:44 PM  Result Value Ref Range   Tricyclic, Ur Screen NONE DETECTED NONE DETECTED   Amphetamines, Ur Screen NONE DETECTED NONE DETECTED   MDMA (Ecstasy)Ur Screen NONE DETECTED NONE DETECTED   Cocaine Metabolite,Ur [place] NONE DETECTED NONE DETECTED   Opiate, Ur Screen NONE DETECTED NONE DETECTED   Phencyclidine (PCP) Ur S NONE DETECTED NONE DETECTED   Cannabinoid 50 Ng, Ur [place] NONE DETECTED NONE DETECTED   Barbiturates, Ur Screen NONE DETECTED NONE DETECTED   Benzodiazepine, Ur Scrn NONE DETECTED NONE DETECTED   Methadone Scn, Ur NONE DETECTED NONE DETECTED    Comment: (NOTE) Tricyclics + metabolites, urine    Cutoff 1000 ng/mL Amphetamines +  metabolites, urine  Cutoff 1000 ng/mL MDMA (Ecstasy), urine              Cutoff 500 ng/mL Cocaine Metabolite, urine          Cutoff 300 ng/mL Opiate + metabolites, urine        Cutoff 300 ng/mL Phencyclidine (PCP), urine         Cutoff 25 ng/mL Cannabinoid, urine                 Cutoff 50 ng/mL Barbiturates + metabolites, urine  Cutoff 200 ng/mL Benzodiazepine, urine              Cutoff 200 ng/mL Methadone, urine                   Cutoff 300 ng/mL  The urine drug screen provides only a preliminary, unconfirmed analytical test result and should not be used for non-medical purposes. Clinical consideration and professional judgment should be applied to any positive drug screen result due to possible interfering substances. A more specific alternate chemical method must be used in order to obtain a confirmed analytical result. Gas chromatography / mass spectrometry (GC/MS) is the preferred confirm atory method. Performed at [facility], [address] [place], [place] [address]     Current Facility-Administered Medications  Medication Dose Route Frequency Provider Last Rate Last Admin  . alum & mag hydroxide-simeth (MAALOX/MYLANTA) 200-200-20 MG/5ML suspension 30 mL  30 mL Oral Q6H PRN Carrie Mew, MD      . gabapentin (NEURONTIN) tablet 600 mg  600 mg Oral TID Laveda Demedeiros, Madie Reno, MD   600 mg at 05/11/20 0940  . ibuprofen (ADVIL) tablet 600 mg  600 mg Oral Q8H PRN Carrie Mew, MD   600 mg at 05/11/20 0848  . levothyroxine (SYNTHROID) tablet 125 mcg  125 mcg Oral QAC breakfast Duffy Bruce, MD  125 mcg at 05/11/20 0848  . LORazepam (ATIVAN) injection 0-4 mg  0-4 mg Intravenous Q6H Carrie Mew, MD       Or  . LORazepam (ATIVAN) tablet 0-4 mg  0-4 mg Oral Q6H Carrie Mew, MD   2 mg at 05/10/20 2356  . [START ON 05/12/2020] LORazepam (ATIVAN) injection 0-4 mg  0-4 mg Intravenous Q12H Carrie Mew, MD       Or  . Derrill Memo ON 05/12/2020] LORazepam  (ATIVAN) tablet 0-4 mg  0-4 mg Oral Q12H Carrie Mew, MD      . thiamine tablet 100 mg  100 mg Oral Daily Carrie Mew, MD   100 mg at 05/11/20 0940   Or  . thiamine (B-1) injection 100 mg  100 mg Intravenous Daily Carrie Mew, MD       Current Outpatient Medications  Medication Sig Dispense Refill  . ALPRAZolam (XANAX) 0.25 MG tablet Take 0.25 mg by mouth at bedtime as needed for sleep.    . busPIRone (BUSPAR) 15 MG tablet Take 1 tablet (15 mg total) by mouth 2 (two) times daily. 60 tablet 0  . FLUoxetine HCl 60 MG TABS Take 60 mg by mouth daily at 12 noon.    . hydrOXYzine (ATARAX/VISTARIL) 25 MG tablet Take 25 mg by mouth every 8 (eight) hours as needed for itching.     . levothyroxine (SYNTHROID) 125 MCG tablet Take 1 tablet (125 mcg total) by mouth daily before breakfast. 30 tablet 0  . traZODone (DESYREL) 50 MG tablet Take 100 mg by mouth at bedtime as needed for sleep.      Musculoskeletal: Strength & Muscle Tone: within normal limits Gait & Station: normal Patient leans: N/A  Psychiatric Specialty Exam: Physical Exam Vitals and nursing note reviewed.  Constitutional:      Appearance: He is well-developed.  HENT:     Head: Normocephalic and atraumatic.  Eyes:     Conjunctiva/sclera: Conjunctivae normal.     Pupils: Pupils are equal, round, and reactive to light.  Cardiovascular:     Heart sounds: Normal heart sounds.  Pulmonary:     Effort: Pulmonary effort is normal.  Abdominal:     Palpations: Abdomen is soft.  Musculoskeletal:        General: Normal range of motion.     Cervical back: Normal range of motion.  Skin:    General: Skin is warm and dry.  Neurological:     General: No focal deficit present.     Mental Status: He is alert.  Psychiatric:        Attention and Perception: Attention normal.        Mood and Affect: Mood is depressed.        Speech: Speech is delayed.        Behavior: Behavior is slowed.        Thought Content: Thought  content includes suicidal ideation. Thought content does not include suicidal plan.        Cognition and Memory: Memory is impaired.        Judgment: Judgment is impulsive.     Review of Systems  Constitutional: Negative.   HENT: Negative.   Eyes: Negative.   Respiratory: Negative.   Cardiovascular: Negative.   Gastrointestinal: Negative.   Musculoskeletal: Negative.   Skin: Negative.   Neurological: Negative.   Psychiatric/Behavioral: Positive for dysphoric mood.    Blood pressure 120/70, pulse 75, temperature 98.5 F (36.9 C), temperature source Oral, resp. rate 16, height 5\' 5"  (1.651  m), weight 62.1 kg, SpO2 98 %.Body mass index is 22.8 kg/m.  General Appearance: Casual  Eye Contact:  Good  Speech:  Clear and Coherent  Volume:  Decreased  Mood:  Dysphoric  Affect:  Constricted  Thought Process:  Coherent  Orientation:  Full (Time, Place, and Person)  Thought Content:  Rumination  Suicidal Thoughts:  Yes.  without intent/plan  Homicidal Thoughts:  No  Memory:  Immediate;   Fair Recent;   Poor Remote;   Poor  Judgement:  Fair  Insight:  Fair  Psychomotor Activity:  Decreased  Concentration:  Concentration: Fair  Recall:  [place] of Knowledge:  Fair  Language:  Fair  Akathisia:  No  Handed:  Right  AIMS (if indicated):     Assets:  Desire for Improvement Resilience Social Support  ADL's:  Impaired  Cognition:  Impaired,  Mild  Sleep:        Treatment Plan Summary: Daily contact with patient to assess and evaluate symptoms and progress in treatment, Medication management and Plan At this point his risk of alcohol withdrawal seizure is passed and he is not showing any signs of oncoming delirium.  I am reassured by collateral information that he had only been drinking for a couple of days.  I think patient is safe and appropriate for admission to the psychiatry ward and have informed him of such.  He agrees to the plan.  I will mention that although his first  language is still of course Spanish I think his English is good enough that he can receive appropriate treatment even if only [place] language is available.  He was able to express complex thoughts and engage in a normal conversation on a variety of topics using English.  I say this just to support the idea that at discharge he may be suitable for follow-up treatment even if no Spanish language provider is available.  Orders will be completed for admission.  Disposition: Recommend psychiatric Inpatient admission when medically cleared.  Alethia Berthold, MD 05/11/2020 10:04 AM

## 2020-05-11 NOTE — ED Notes (Signed)
Patient c/o increased "depression and anxiety." No nausea or headache reported. No tremors noted. Patient declined Ativan, but states he wants a medication to help him deal with these feelings. MD is in an emergency, but will be informed when available. No prn meds for complaint noted. Patient had just awakened. Patient was given a ginger ale and TV remote. Patient was instructed to use therapies that he learned to deal with the depression while waiting for medication. Patient agreed.

## 2020-05-11 NOTE — ED Notes (Signed)
Report to include Situation, Background, Assessment, and Recommendations received from Sonjia RN. Patient alert and oriented, warm and dry, in no acute distress. Patient denies SI, HI, AVH and pain. Patient made aware of Q15 minute rounds and Rover and Officer presence for their safety. Patient instructed to come to me with needs or concerns.  

## 2020-05-11 NOTE — ED Notes (Signed)
Patient ate 100% of dinner tray.

## 2020-05-11 NOTE — BH Assessment (Signed)
Writer spoke with the patient to complete an updated/reassessment. Patient denies SI/HI and AV/H. Patient states he was intoxicated when he arrived to the ER and believe that is the reason why he was voicing SI and AV/H. He further explained, he was upset the other day, because the gas station overcharged him for gas. He tried to get his money back but was told he couldn't. The gas station, the bank and a "toll free number," told them they couldn't do anything. He states, he's currently unemployed and the fact he doesn't have money to waste made him upset. He drank a beer to calm down, and it resulted in him relapsing and drinking for several days.

## 2020-05-11 NOTE — ED Notes (Signed)
Patient appears slightly anxious, rubbing his hands. Patient states he usually gets 50mg , but 25mg  should be ok. Patient took the medication without difficulty.

## 2020-05-11 NOTE — ED Notes (Signed)
Patient given dinner tray.

## 2020-05-11 NOTE — ED Notes (Signed)
IVC/  PENDING  PLACEMENT 

## 2020-05-11 NOTE — ED Notes (Signed)
Psychiatry at bedside.

## 2020-05-12 ENCOUNTER — Encounter: Payer: Self-pay | Admitting: Psychiatry

## 2020-05-12 ENCOUNTER — Other Ambulatory Visit: Payer: Self-pay

## 2020-05-12 LAB — LIPID PANEL
Cholesterol: 207 mg/dL — ABNORMAL HIGH (ref 0–200)
HDL: 90 mg/dL (ref >40)
LDL Cholesterol: 103 mg/dL — ABNORMAL HIGH (ref 0–99)
Total CHOL/HDL Ratio: 2.3 RATIO
Triglycerides: 72 mg/dL (ref <150)
VLDL: 14 mg/dL (ref 0–40)

## 2020-05-12 LAB — TSH: TSH: 2.618 u[IU]/mL (ref 0.350–4.500)

## 2020-05-12 MED ORDER — QUETIAPINE FUMARATE 25 MG PO TABS
50.0000 mg | ORAL_TABLET | Freq: Every day | ORAL | Status: DC
  Administered 2020-05-12: 50 mg via ORAL

## 2020-05-12 NOTE — Tx Team (Signed)
Initial Treatment Plan 05/12/2020 12:42 AM Ivan Hamilton GQB:[id number]    PATIENT STRESSORS: Marital or family conflict Substance abuse   PATIENT STRENGTHS: Capable of independent living Communication skills General fund of knowledge Motivation for treatment/growth   PATIENT IDENTIFIED PROBLEMS: Substance Abuse  Depression  Suicidal ideation                 DISCHARGE CRITERIA:  Improved stabilization in mood, thinking, and/or behavior  PRELIMINARY DISCHARGE PLAN: Outpatient therapy  PATIENT/FAMILY INVOLVEMENT: This treatment plan has been presented to and reviewed with the patient, Ivan Hamilton, and/or family member,  .  The patient and family have been given the opportunity to ask questions and make suggestions.  Ivan Parkins, RN 05/12/2020, 12:42 AM

## 2020-05-12 NOTE — H&P (Addendum)
Psychiatric Admission Assessment Adult  Patient Identification: Ivan Hamilton MRN:  [id number] Date of Evaluation:  05/12/2020 Chief Complaint:  PTSD (post-traumatic stress disorder) [F43.10] Principal Diagnosis: Severe recurrent major depressive disorder with psychotic features ([place]) Diagnosis:  Principal Problem:   Severe recurrent major depressive disorder with psychotic features ([place]) Active Problems:   PTSD (post-traumatic stress disorder)   Hypothyroid   Gastric reflux   Alcohol withdrawal (HCC)   Alcohol use disorder, severe, dependence ([place])   Suicidal ideation  History of Present Illness: Ivan Hamilton seen at bedside today. He declines the use of a Optometrist. He states that his memory for the events of this current hospitalization are gone. He recalls being upset by a gas station over charging him, and the manager refusing to fix the price. He recalls calling his bank and them being unable to assist him either. After this he became very angry and depressed feeling like this was all caused by racism. He began to drink heavily. He vaguely remembers feeling suicidal and calling the police to get help. He does not recall any of his time here in the emergency room until he woke up on the behavioral health unit. He reports he was previously on medication outside the hospital that was modestly helping with depression and anxiety, but notes he was struggling with PTSD. He has intrusive thoughts, flashbacks, hypervigilence, and nightmares about the time he fought in [place]. He also reports that recently he has starting hearing voices, seeing people walk around the house, and has even felt he was being drug under the bed by a spirit. These hallucinations happened outside of the hospital in a period of sobriety, but severe depression. He feels he needs his medications adjusted, and feels he would also benefit greatly from a therapist to work through PTSD. He is aware that alcohol worsens all  of the symptoms. He has a history of alcohol withdrawal seizures and delirium tremens. Currently blood pressure and heart rate low. He does not have any nausea, headache, or tremor on exam.  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  Prior alcohol problems at times complicated by withdrawal  Is the patient at risk to self? Yes.    Has the patient been a risk to self in the past 6 months? Yes.    Has the patient been a risk to self within the distant past? Yes.    Is the patient a risk to others? No.  Has the patient been a risk to others in the past 6 months? No.  Has the patient been a risk to others within the distant past? No.   Prior Inpatient Therapy:   Prior Outpatient Therapy:    Alcohol Screening: Patient refused Alcohol Screening Tool: Yes 1. How often do you have a drink containing alcohol?: 4 or more times a week 2. How many drinks containing alcohol do you have  on a typical day when you are drinking?: 5 or 6 3. How often do you have six or more drinks on one occasion?: Monthly AUDIT-C Score: 8 4. How often  during the last year have you found that you were not able to stop drinking once you had started?: Weekly 5. How often during the last year have you failed to do what was normally expected from you because of drinking?: Weekly 6. How often during the last year have you needed a first drink in the morning to get yourself going after a heavy drinking session?: Weekly 7. How often during the last year have you had a feeling of guilt of remorse after drinking?: Weekly 8. How often during the last year have you been unable to remember what happened the night before because you had been drinking?: Less than monthly 9. Have you or someone else been injured as a result of your drinking?: No 10. Has a relative or friend or a doctor or another health worker been concerned about your drinking or suggested you cut down?: Yes, but not in the last year Alcohol Use Disorder Identification Test Final Score (AUDIT): 23 Alcohol Brief Interventions/Follow-up: Alcohol Education Substance Abuse History in the last 12 months:  Yes.   Consequences of Substance Abuse: Medical Consequences:  history of withdrawal seizures Legal Consequences:  currently on probation Blackouts:  unable to recall any time here in the emergency department DT's: history of DTs on multiple occasions Withdrawal Symptoms:   Headaches Nausea Previous Psychotropic Medications: Yes  Psychological Evaluations: Yes  Past Medical History:  Past Medical History:  Diagnosis Date  . Alcohol withdrawal ([place])   . B12 deficiency   . Chronic pain   . CVA (cerebral vascular accident) ([place])   . Hypertension   . Hypothyroidism   . Varicose veins     Past Surgical History:  Procedure Laterality Date  . CHOLECYSTECTOMY    . ESOPHAGOGASTRODUODENOSCOPY (EGD) WITH PROPOFOL N/A 10/24/2017   Procedure: ESOPHAGOGASTRODUODENOSCOPY (EGD) WITH PROPOFOL;  Surgeon: [name], MD;  Location: [facility] ENDOSCOPY;  Service: Endoscopy;  Laterality: N/A;  .  ESOPHAGOGASTRODUODENOSCOPY (EGD) WITH PROPOFOL N/A 07/12/2019   Procedure: ESOPHAGOGASTRODUODENOSCOPY (EGD) WITH PROPOFOL;  Surgeon: Jonathon Bellows, MD;  Location: [facility] ENDOSCOPY;  Service: Gastroenterology;  Laterality: N/A;  . LEG SURGERY     Family History: History reviewed. No pertinent family history. Family Psychiatric  History: Family history of alcohol abuse Tobacco Screening: Have you used any form of tobacco in the last 30 days? (Cigarettes, Smokeless Tobacco, Cigars, and/or Pipes): No Social History:  Social History   Substance and Sexual Activity  Alcohol Use Yes   Comment: daily- 1 pint of vodka     Social History   Substance and Sexual Activity  Drug Use No    Additional Social History:                           Allergies:  No Known Allergies Lab Results:  Results for orders placed or performed during the hospital encounter of 05/11/20 (from the past 48 hour(s))  Lipid panel     Status: Abnormal   Collection Time: 05/12/20  9:21 AM  Result Value Ref Range   Cholesterol 207 (H) 0 - 200 mg/dL   Triglycerides 72 <150 mg/dL   HDL 90 >40 mg/dL   Total CHOL/HDL Ratio 2.3 RATIO   VLDL 14 0 - 40 mg/dL   LDL Cholesterol 103 (H) 0 - 99 mg/dL  Comment:        Total Cholesterol/HDL:CHD Risk Coronary Heart Disease Risk Table                     Men   Women  1/2 Average Risk   3.4   3.3  Average Risk       5.0   4.4  2 X Average Risk   9.6   7.1  3 X Average Risk  23.4   11.0        Use the calculated Patient Ratio above and the CHD Risk Table to determine the patient's CHD Risk.        ATP III CLASSIFICATION (LDL):  <100     mg/dL   Optimal  100-129  mg/dL   Near or Above                    Optimal  130-159  mg/dL   Borderline  160-189  mg/dL   High  >190     mg/dL   Very High Performed at [facility], [address] [place], [place] [address]   TSH     Status: None   Collection Time: 05/12/20  9:21 AM  Result Value Ref Range   TSH  2.618 0.350 - 4.500 uIU/mL    Comment: Performed by a 3rd Generation assay with a functional sensitivity of <=0.01 uIU/mL. Performed at [facility], [address] [place], [place] [address]     Blood Alcohol level:  Lab Results  Component Value Date   UJW 119 [facility]) 05/09/2020   ETH 471 (HH) 14/78/2956    Metabolic Disorder Labs:  Lab Results  Component Value Date   HGBA1C 5.7 (H) 08/29/2018   MPG 116.89 08/29/2018   MPG 99.67 10/26/2017   No results found for: PROLACTIN Lab Results  Component Value Date   CHOL 207 (H) 05/12/2020   TRIG 72 05/12/2020   HDL 90 05/12/2020   CHOLHDL 2.3 05/12/2020   VLDL 14 05/12/2020   LDLCALC 103 (H) 05/12/2020   LDLCALC 96 08/29/2018    Current Medications: Current Facility-Administered Medications  Medication Dose Route Frequency Provider Last Rate Last Admin  . acetaminophen (TYLENOL) tablet 650 mg  650 mg Oral Q6H PRN Clapacs, John T, MD      . alum & mag hydroxide-simeth (MAALOX/MYLANTA) 200-200-20 MG/5ML suspension 30 mL  30 mL Oral Q4H PRN Clapacs, John T, MD      . FLUoxetine (PROZAC) capsule 60 mg  60 mg Oral Q1200 Clapacs, John T, MD      . gabapentin (NEURONTIN) tablet 600 mg  600 mg Oral TID Clapacs, John T, MD   600 mg at 05/12/20 0837  . hydrOXYzine (ATARAX/VISTARIL) tablet 25 mg  25 mg Oral Q8H PRN Clapacs, Madie Reno, MD   25 mg at 05/12/20 0837  . ibuprofen (ADVIL) tablet 600 mg  600 mg Oral Q8H PRN Clapacs, John T, MD      . levothyroxine (SYNTHROID) tablet 125 mcg  125 mcg Oral Q0600 Clapacs, Madie Reno, MD   125 mcg at 05/12/20 [phone]  . LORazepam (ATIVAN) injection 0-4 mg  0-4 mg Intravenous Q12H Clapacs, Madie Reno, MD       Or  . LORazepam (ATIVAN) tablet 0-4 mg  0-4 mg Oral Q12H Clapacs, John T, MD      . magnesium hydroxide (MILK OF MAGNESIA) suspension 30 mL  30 mL Oral Daily PRN Clapacs, Madie Reno, MD      .  QUEtiapine (SEROQUEL) tablet 50 mg  50 mg Oral QHS Salley Scarlet, MD      . thiamine tablet 100 mg  100 mg  Oral Daily Clapacs, John T, MD   100 mg at 05/12/20 2831   Or  . thiamine (B-1) injection 100 mg  100 mg Intravenous Daily Clapacs, John T, MD      . traZODone (DESYREL) tablet 100 mg  100 mg Oral QHS PRN Clapacs, Madie Reno, MD   100 mg at 05/12/20 0151   PTA Medications: Medications Prior to Admission  Medication Sig Dispense Refill Last Dose  . ALPRAZolam (XANAX) 0.25 MG tablet Take 0.25 mg by mouth at bedtime as needed for sleep.     . busPIRone (BUSPAR) 15 MG tablet Take 1 tablet (15 mg total) by mouth 2 (two) times daily. 60 tablet 0   . FLUoxetine HCl 60 MG TABS Take 60 mg by mouth daily at 12 noon.     . hydrOXYzine (ATARAX/VISTARIL) 25 MG tablet Take 25 mg by mouth every 8 (eight) hours as needed for itching.      . levothyroxine (SYNTHROID) 125 MCG tablet Take 1 tablet (125 mcg total) by mouth daily before breakfast. 30 tablet 0   . traZODone (DESYREL) 50 MG tablet Take 100 mg by mouth at bedtime as needed for sleep.       Musculoskeletal: Strength & Muscle Tone: within normal limits Gait & Station: normal Patient leans: N/A  Psychiatric Specialty Exam: Physical Exam Vitals and nursing note reviewed.  Constitutional:      Appearance: Normal appearance.  HENT:     Head: Normocephalic and atraumatic.     Right Ear: External ear normal.     Left Ear: External ear normal.     Nose: Nose normal.     Mouth/Throat:     Mouth: Mucous membranes are moist.     Pharynx: Oropharynx is clear.  Eyes:     Extraocular Movements: Extraocular movements intact.     Conjunctiva/sclera: Conjunctivae normal.     Pupils: Pupils are equal, round, and reactive to light.  Cardiovascular:     Rate and Rhythm: Normal rate.     Pulses: Normal pulses.  Pulmonary:     Effort: Pulmonary effort is normal.     Breath sounds: Normal breath sounds.  Abdominal:     General: Abdomen is flat.     Palpations: Abdomen is soft.  Musculoskeletal:        General: No swelling. Normal range of motion.      Cervical back: Normal range of motion and neck supple.  Skin:    General: Skin is warm and dry.  Neurological:     General: No focal deficit present.     Mental Status: He is alert and oriented to person, place, and time.  Psychiatric:        Attention and Perception: He perceives auditory and visual hallucinations.        Mood and Affect: Mood is anxious.        Speech: Speech is tangential.        Behavior: Behavior is cooperative.        Thought Content: Thought content includes suicidal ideation.        Cognition and Memory: Cognition normal. He exhibits impaired recent memory.        Judgment: Judgment is impulsive.     Review of Systems  Constitutional: Negative for chills and fatigue.  HENT: Negative for rhinorrhea and sore  throat.   Eyes: Positive for visual disturbance. Negative for photophobia.  Respiratory: Negative for cough and shortness of breath.   Cardiovascular: Negative for chest pain and palpitations.  Gastrointestinal: Negative for constipation, diarrhea, nausea and vomiting.  Endocrine: Negative for cold intolerance and heat intolerance.  Genitourinary: Negative for difficulty urinating and dysuria.  Musculoskeletal: Positive for arthralgias and myalgias.  Skin: Negative for rash and wound.  Allergic/Immunologic: Negative for environmental allergies and food allergies.  Neurological: Negative for dizziness and headaches.  Hematological: Negative for adenopathy. Does not bruise/bleed easily.  Psychiatric/Behavioral: Positive for confusion, dysphoric mood, hallucinations and sleep disturbance. Negative for suicidal ideas. The patient is nervous/anxious.     Blood pressure 113/90, pulse 73, temperature 97.6 F (36.4 C), temperature source Oral, resp. rate 17, height 5\' 5"  (1.651 m), weight 63 kg, SpO2 99 %.Body mass index is 23.13 kg/m.  General Appearance: Well Groomed  Eye Contact:  Good  Speech:  Normal Rate  Volume:  Normal  Mood:  Anxious  Affect:   Congruent  Thought Process:  Coherent  Orientation:  Full (Time, Place, and Person)  Thought Content:  Hallucinations: Auditory Tactile Visual and Tangential  Suicidal Thoughts:  No  Homicidal Thoughts:  No  Memory:  Immediate;   Fair Recent;   Poor Remote;   Good  Judgement:  Impaired  Insight:  Fair  Psychomotor Activity:  Normal  Concentration:  Concentration: Fair and Attention Span: Fair  Recall:  [employer] of Knowledge:  Fair  Language:  Fair  Akathisia:  Negative  Handed:  Right  AIMS (if indicated):     Assets:  Communication Skills Desire for Improvement Financial Resources/Insurance Housing Resilience  ADL's:  Intact  Cognition:  Impaired,  Mild  Sleep:          Treatment Plan Summary: Daily contact with patient to assess and evaluate symptoms and progress in treatment and Medication management  Plan: Convert to voluntary admission. Will add seroquel 50 mg QHS given reports of psychosis. CIWA currently zero, and does not appear to be alcoholic hallucinosis, but rather depression with psychotic features. Continue CIWA protocol. Contacted his probation officer in [place], [place] per request to alert her of inpatient admission.  Observation Level/Precautions:  Detox 15 minute checks  Laboratory:  Completed in ED  Psychotherapy:    Medications:    Consultations:    Discharge Concerns:    Estimated LOS:  Other:     Physician Treatment Plan for Primary Diagnosis: Severe recurrent major depressive disorder with psychotic features ([place]) Long Term Goal(s): Improvement in symptoms so as ready for discharge  Short Term Goals: Ability to identify changes in lifestyle to reduce recurrence of condition will improve, Ability to verbalize feelings will improve, Ability to disclose and discuss suicidal ideas, Ability to demonstrate self-control will improve, Ability to identify and develop effective coping behaviors will improve, Compliance with prescribed medications will  improve and Ability to identify triggers associated with substance abuse/mental health issues will improve  Physician Treatment Plan for Secondary Diagnosis: Principal Problem:   Severe recurrent major depressive disorder with psychotic features ([place]) Active Problems:   PTSD (post-traumatic stress disorder)   Hypothyroid   Gastric reflux   Alcohol withdrawal ([place])   Alcohol use disorder, severe, dependence ([place])   Suicidal ideation  Long Term Goal(s): Improvement in symptoms so as ready for discharge  Short Term Goals: Ability to identify changes in lifestyle to reduce recurrence of condition will improve, Ability to verbalize feelings will improve, Ability to  disclose and discuss suicidal ideas, Ability to demonstrate self-control will improve, Ability to identify and develop effective coping behaviors will improve, Compliance with prescribed medications will improve and Ability to identify triggers associated with substance abuse/mental health issues will improve  I certify that inpatient services furnished can reasonably be expected to improve the patient's condition.    Salley Scarlet, MD 10/21/202112:31 PM
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
## 2020-05-12 NOTE — BHH Counselor (Addendum)
Adult Comprehensive Assessment  Patient ID: Ivan Hamilton, male   DOB: Dec 11, 1965, 54 y.o.   MRN: [id number]  Information Source: Information source: Patient   Current Stressors:  Patient states their primary concerns and needs for treatment are:: "Feel a lot of depressed.anxiety, tried to hurt myself but didn't. Called the police to bring me here before something happened." Patient states their goals for this hospitilization and ongoing recovery are:: "Need to find help.some treatment." Educational / Learning stressors: None reported.  Employment / Job issues: Patient is unemployed and currently on disability.  Family Relationships: Pt reports his daughters moved out of the home due to his continued alcohol use.  Financial / Lack of resources (include bankruptcy): Limited income.  Housing / Lack of housing: None reported  Physical health (include injuries & life threatening diseases): Pt reports having back and leg problems. Social relationships: None reported  Substance abuse: Pt reports hx of alcoholism.    Living/Environment/Situation:  Living Arrangements: Ex-wife Living conditions (as described by patient or guardian): Pt lives with ex-wife. He says his daughters moved out due to his continued alcohol use. He hopes they will return when he is better.  How long has patient lived in current situation?: 13 years  What is atmosphere in current home: Supportive, Loving   Family History:  Marital status: Divorced Number of Years Married: 36 What types of issues is patient dealing with in the relationship?: Pt's drinking had caused problems within the marriage  Are you sexually active?: None reported What is your sexual orientation?: Heterosexual  Has your sexual activity been affected by drugs, alcohol, medication, or emotional stress?: None reported Does patient have children?: Yes How many children?: 2 How is patient's relationship with their children?: 2 daughters, ages 64 and  82; good relationships    Childhood History:  By whom was/is the patient raised?: Both parents Description of patient's relationship with caregiver when they were a child: Good relationship.  Patient's description of current relationship with people who raised him/her: Mother lives in [place], father passed away a few years ago.  How were you disciplined when you got in trouble as a child/adolescent?: None reported  Does patient have siblings?: Yes Number of Siblings: 8 Description of patient's current relationship with siblings: 5 brothers, 3 sisters; "ok" relationships.  Did patient suffer any verbal/emotional/physical/sexual abuse as a child?: No Did patient suffer from severe childhood neglect?: No Has patient ever been sexually abused/assaulted/raped as an adolescent or adult?: No Was the patient ever a victim of a crime or a disaster?: No Witnessed domestic violence?: No Has patient been effected by domestic violence as an adult?: No   Education:  Highest grade of school patient has completed: Pt reports he graduated high school in [place].   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   Financial Resources:   Financial resources: Disability Does patient have a Programmer, applications or guardian?: No   Alcohol/Substance Abuse:   What has been your use of drugs/alcohol within the last 12 months?: Patient reports alcohol use. Patient reports drinking a pint of vodka daily but was noted to have only be drinking  a few days this time around.   Alcohol/Substance Abuse Treatment Hx: Past Tx, Inpatient If yes, describe  treatment: [facility], [facility] (a few times), and [facility] (residential treatment).  Has alcohol/substance abuse ever caused legal problems?: Yes   Social Support System:   Patient's Community Support System: Good Describe Community Support System: family  Type of faith/religion: NA How does patient's faith help to cope with current illness?: NA    Leisure/Recreation:   Leisure and Hobbies: Pt reports he works all the time doing things around his house.    Strengths/Needs:   What things does the patient do well?: Pt reports he enjoys working and likes to stay busy.  In what areas does patient struggle / problems for patient: unemployment, alcohol abuse.    Discharge Plan:   Does patient have access to transportation?: Patient requests assistance with transportation at discharge. Will patient be returning to same living situation after discharge?: Yes Currently receiving community mental health services: No; patient states that he wants alcohol outpatient treatment.  Does patient have financial barriers related to discharge medications?: No; patient has [employer].     Summary/Recommendations:   Summary and Recommendations (to be completed by the evaluator): Patient is a 54 year old Hispanic male who was brought to [facility] by police in the context of alcohol use, depression with thoughts of hurting himself, and anxiety. He reports auditory and visual hallucinations. He states that he was having thoughts of hurting himself but did not and that he called the police before something happened. Patient expresses interest in outpatient follow up after discharge. Recommendations include: crisis stabilization, therapeutic milieu, encourage group attendance and participation, medication management for detox/mood stabilization, development of comprehensive mental wellness plan.  Ivan Hamilton. 05/12/2020

## 2020-05-12 NOTE — BHH Counselor (Signed)
CSW reached out to Officer Konrad Penta [phone]) of [facility] regarding patient admission. Contact unable to be established but a HIPPA compliant voicemail was left with contact information for follow up. CSW will continue to follow.   Chalmers Guest. Guerry Bruin, MSW, [place], [place] 05/12/2020 3:28 PM

## 2020-05-12 NOTE — Progress Notes (Signed)
Recreation Therapy Notes  INPATIENT RECREATION THERAPY ASSESSMENT  Patient Details Name: Ivan Hamilton MRN: [id number] DOB: 07/30/1965 Today's Date: 05/12/2020       Information Obtained From: Patient  Able to Participate in Assessment/Interview: Yes  Patient Presentation: Responsive  Reason for Admission (Per Patient): Active Symptoms, Substance Abuse  Patient Stressors:    Coping Skills:   Substance Abuse, Exercise, Art  Leisure Interests (2+):  Exercise - Walking, Art - Coloring, Music - Listen, Individual - TV  Frequency of Recreation/Participation:    Awareness of Community Resources:     [employer]:     Current Use:    If no, Barriers?:    Expressed Interest in [employer] Information:    [place] of Residence:  Insurance underwriter  Patient Main Form of Transportation: Musician  Patient Strengths:  Communication  Patient Identified Areas of Improvement:  Stop drinking  Patient Goal for Hospitalization:  Figure out what got me here  Current SI (including self-harm):  No  Current HI:  No  Current AVH: Yes (Hearing voices outside my window)  Staff Intervention Plan: Group Attendance, Collaborate with Interdisciplinary Treatment Team  Consent to Intern Participation: N/A  Ivan Hamilton 05/12/2020, 3:46 PM

## 2020-05-12 NOTE — BHH Suicide Risk Assessment (Signed)
Ivan Hamilton INPATIENT:  Family/Significant Other Suicide Prevention Education  Suicide Prevention Education:  Education Completed; Ivan Hamilton/ex-wife, [phone], has been identified by the patient as the family member/significant other with whom the patient will be residing, and identified as the person(s) who will aid the patient in the event of a mental health crisis (suicidal ideations/suicide attempt).  With written consent from the patient, the family member/significant other has been provided the following suicide prevention education, prior to the and/or following the discharge of the patient.  The suicide prevention education provided includes the following:  Suicide risk factors  Suicide prevention and interventions  National Suicide Hotline telephone number  [facility] assessment telephone number  [facility] Emergency Assistance [place] and/or Residential Mobile Crisis Unit telephone number  Request made of family/significant other to:  Remove weapons (e.g., guns, rifles, knives), all items previously/currently identified as safety concern.    Remove drugs/medications (over-the-counter, prescriptions, illicit drugs), all items previously/currently identified as a safety concern.  The family member/significant other verbalizes understanding of the suicide prevention education information provided.  The family member/significant other agrees to remove the items of safety concern listed above.  [place]eller shares that patient has lots of mental issues. She states that he is always talking about killing himself and seeing/hearing things that she does not. She endorses a number of plans about how he would do it. She denies any weapons in the home.   Ivan Hamilton 05/12/2020, 11:34 AM
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
## 2020-05-12 NOTE — Plan of Care (Signed)
Patient verbalized minimal withdrawal symptoms.Patient stated that he is depressed.Denies SI,HI and AVH.Vistaril helped with anxiety.Attended groups.Appetite and energy level good.Support and encouragement given.

## 2020-05-12 NOTE — Progress Notes (Signed)
CIWA scores 11 at this time.Patient states " I feel like running away from here." Denies SI,HI and AVH at this time.Patient had lunch.Ativen given.Encouraged fluids.

## 2020-05-12 NOTE — Plan of Care (Signed)
  Problem: Safety: Goal: Periods of time without injury will increase Outcome: Progressing   

## 2020-05-12 NOTE — Progress Notes (Signed)
Patient yelling out in room, stating hes hearing toilets flushing, hearing someone outside of window talking. Reports unable to sleep. Prns given.

## 2020-05-12 NOTE — Progress Notes (Signed)
Patient verbalized mild withdrawal symptoms but anxiety.Had breakfast and encouraged fluids.Vistaril given.Patient verbalized that he likes to attend groups.Denies SI,HI and AVH.Will continue monitoring the patient.

## 2020-05-12 NOTE — BHH Group Notes (Signed)
LCSW Group Therapy Note  05/12/2020 2:44 PM  Type of Therapy/Topic:  Group Therapy:  Balance in Life  Participation Level:  Active  Description of Group:    This group will address the concept of balance and how it feels and looks when one is unbalanced. Patients will be encouraged to process areas in their lives that are out of balance and identify reasons for remaining unbalanced. Facilitators will guide patients in utilizing problem-solving interventions to address and correct the stressor making their life unbalanced. Understanding and applying boundaries will be explored and addressed for obtaining and maintaining a balanced life. Patients will be encouraged to explore ways to assertively make their unbalanced needs known to significant others in their lives, using other group members and facilitator for support and feedback.  Therapeutic Goals: 1. Patient will identify two or more emotions or situations they have that consume much of in their lives. 2. Patient will identify signs/triggers that life has become out of balance:  3. Patient will identify two ways to set boundaries in order to achieve balance in their lives:  4. Patient will demonstrate ability to communicate their needs through discussion and/or role plays  Summary of Patient Progress: Patient was active in the group but not focused on the topic at hand. He wanted to discussed his personal issues with transportation, insurance, and mental health.  Therapeutic Modalities:   Cognitive Behavioral Therapy Solution-Focused Therapy Assertiveness Training  [employer]. Guerry Bruin, MSW, [place], [place] 05/12/2020 2:44 PM

## 2020-05-13 LAB — HEMOGLOBIN A1C
Hgb A1c MFr Bld: 5.3 % (ref 4.8–5.6)
Mean Plasma Glucose: 105 mg/dL

## 2020-05-13 MED ORDER — QUETIAPINE FUMARATE 100 MG PO TABS
100.0000 mg | ORAL_TABLET | Freq: Every day | ORAL | Status: DC
  Administered 2020-05-13: 100 mg via ORAL
  Filled 2020-05-13: qty 1

## 2020-05-13 NOTE — Tx Team (Signed)
Interdisciplinary Treatment and Diagnostic Plan Update  05/13/2020 Time of Session: 09:00 Ivan Hamilton MRN: [id number]  Principal Diagnosis: Severe recurrent major depressive disorder with psychotic features [facility])  Secondary Diagnoses: Principal Problem:   Severe recurrent major depressive disorder with psychotic features ([place]) Active Problems:   PTSD (post-traumatic stress disorder)   Hypothyroid   Gastric reflux   Alcohol withdrawal ([place])   Alcohol use disorder, severe, dependence ([place])   Suicidal ideation   Current Medications:  Current Facility-Administered Medications  Medication Dose Route Frequency Provider Last Rate Last Admin  . acetaminophen (TYLENOL) tablet 650 mg  650 mg Oral Q6H PRN Clapacs, Madie Reno, MD   650 mg at 05/13/20 7619  . alum & mag hydroxide-simeth (MAALOX/MYLANTA) 200-200-20 MG/5ML suspension 30 mL  30 mL Oral Q4H PRN Clapacs, John T, MD      . FLUoxetine (PROZAC) capsule 60 mg  60 mg Oral Q1200 Clapacs, Madie Reno, MD   60 mg at 05/12/20 1254  . gabapentin (NEURONTIN) tablet 600 mg  600 mg Oral TID Clapacs, John T, MD   600 mg at 05/13/20 5093  . hydrOXYzine (ATARAX/VISTARIL) tablet 25 mg  25 mg Oral Q8H PRN Clapacs, John T, MD   25 mg at 05/12/20 1717  . ibuprofen (ADVIL) tablet 600 mg  600 mg Oral Q8H PRN Clapacs, John T, MD      . levothyroxine (SYNTHROID) tablet 125 mcg  125 mcg Oral Q0600 Clapacs, Madie Reno, MD   125 mcg at 05/13/20 0641  . LORazepam (ATIVAN) injection 0-4 mg  0-4 mg Intravenous Q12H Clapacs, Madie Reno, MD       Or  . LORazepam (ATIVAN) tablet 0-4 mg  0-4 mg Oral Q12H Clapacs, Madie Reno, MD   2 mg at 05/12/20 1254  . magnesium hydroxide (MILK OF MAGNESIA) suspension 30 mL  30 mL Oral Daily PRN Clapacs, John T, MD      . QUEtiapine (SEROQUEL) tablet 50 mg  50 mg Oral QHS Salley Scarlet, MD   50 mg at 05/12/20 2200  . thiamine tablet 100 mg  100 mg Oral Daily Clapacs, John T, MD   100 mg at 05/13/20 2671   Or  . thiamine (B-1) injection 100 mg   100 mg Intravenous Daily Clapacs, John T, MD      . traZODone (DESYREL) tablet 100 mg  100 mg Oral QHS PRN Clapacs, Madie Reno, MD   100 mg at 05/12/20 0151   PTA Medications: Medications Prior to Admission  Medication Sig Dispense Refill Last Dose  . ALPRAZolam (XANAX) 0.25 MG tablet Take 0.25 mg by mouth at bedtime as needed for sleep.     . busPIRone (BUSPAR) 15 MG tablet Take 1 tablet (15 mg total) by mouth 2 (two) times daily. 60 tablet 0   . FLUoxetine HCl 60 MG TABS Take 60 mg by mouth daily at 12 noon.     . hydrOXYzine (ATARAX/VISTARIL) 25 MG tablet Take 25 mg by mouth every 8 (eight) hours as needed for itching.      . levothyroxine (SYNTHROID) 125 MCG tablet Take 1 tablet (125 mcg total) by mouth daily before breakfast. 30 tablet 0   . traZODone (DESYREL) 50 MG tablet Take 100 mg by mouth at bedtime as needed for sleep.       Patient Stressors: Marital or family conflict Substance abuse  Patient Strengths: Capable of independent living Curator fund of knowledge Motivation for treatment/growth  Treatment Modalities: Medication Management, Group therapy,  Case management,  1 to 1 session with clinician, Psychoeducation, Recreational therapy.   Physician Treatment Plan for Primary Diagnosis: Severe recurrent major depressive disorder with psychotic features ([place]) Long Term Goal(s): Improvement in symptoms so as ready for discharge Improvement in symptoms so as ready for discharge   Short Term Goals: Ability to identify changes in lifestyle to reduce recurrence of condition will improve Ability to verbalize feelings will improve Ability to disclose and discuss suicidal ideas Ability to demonstrate self-control will improve Ability to identify and develop effective coping behaviors will improve Compliance with prescribed medications will improve Ability to identify triggers associated with substance abuse/mental health issues will improve Ability to  identify changes in lifestyle to reduce recurrence of condition will improve Ability to verbalize feelings will improve Ability to disclose and discuss suicidal ideas Ability to demonstrate self-control will improve Ability to identify and develop effective coping behaviors will improve Compliance with prescribed medications will improve Ability to identify triggers associated with substance abuse/mental health issues will improve  Medication Management: Evaluate patient's response, side effects, and tolerance of medication regimen.  Therapeutic Interventions: 1 to 1 sessions, Unit Group sessions and Medication administration.  Evaluation of Outcomes: Not Progressing  Physician Treatment Plan for Secondary Diagnosis: Principal Problem:   Severe recurrent major depressive disorder with psychotic features ([facility]) Active Problems:   PTSD (post-traumatic stress disorder)   Hypothyroid   Gastric reflux   Alcohol withdrawal ([facility])   Alcohol use disorder, severe, dependence ([place])   Suicidal ideation  Long Term Goal(s): Improvement in symptoms so as ready for discharge Improvement in symptoms so as ready for discharge   Short Term Goals: Ability to identify changes in lifestyle to reduce recurrence of condition will improve Ability to verbalize feelings will improve Ability to disclose and discuss suicidal ideas Ability to demonstrate self-control will improve Ability to identify and develop effective coping behaviors will improve Compliance with prescribed medications will improve Ability to identify triggers associated with substance abuse/mental health issues will improve Ability to identify changes in lifestyle to reduce recurrence of condition will improve Ability to verbalize feelings will improve Ability to disclose and discuss suicidal ideas Ability to demonstrate self-control will improve Ability to identify and develop effective coping behaviors will improve Compliance with  prescribed medications will improve Ability to identify triggers associated with substance abuse/mental health issues will improve     Medication Management: Evaluate patient's response, side effects, and tolerance of medication regimen.  Therapeutic Interventions: 1 to 1 sessions, Unit Group sessions and Medication administration.  Evaluation of Outcomes: Not Progressing   RN Treatment Plan for Primary Diagnosis: Severe recurrent major depressive disorder with psychotic features ([place]) Long Term Goal(s): Knowledge of disease and therapeutic regimen to maintain health will improve  Short Term Goals: Ability to remain free from injury will improve, Ability to verbalize frustration and anger appropriately will improve, Ability to demonstrate self-control, Ability to participate in decision making will improve, Ability to verbalize feelings will improve, Ability to disclose and discuss suicidal ideas, Ability to identify and develop effective coping behaviors will improve and Compliance with prescribed medications will improve  Medication Management: RN will administer medications as ordered by provider, will assess and evaluate patient's response and provide education to patient for prescribed medication. RN will report any adverse and/or side effects to prescribing provider.  Therapeutic Interventions: 1 on 1 counseling sessions, Psychoeducation, Medication administration, Evaluate responses to treatment, Monitor vital signs and CBGs as ordered, Perform/monitor CIWA, COWS, AIMS and Fall Risk screenings as  ordered, Perform wound care treatments as ordered.  Evaluation of Outcomes: Not Progressing   LCSW Treatment Plan for Primary Diagnosis: Severe recurrent major depressive disorder with psychotic features ([place]) Long Term Goal(s): Safe transition to appropriate next level of care at discharge, Engage patient in therapeutic group addressing interpersonal concerns.  Short Term Goals: Engage  patient in aftercare planning with referrals and resources, Increase social support, Increase ability to appropriately verbalize feelings, Increase emotional regulation, Facilitate acceptance of mental health diagnosis and concerns, Identify triggers associated with mental health/substance abuse issues and Increase skills for wellness and recovery  Therapeutic Interventions: Assess for all discharge needs, 1 to 1 time with Social worker, Explore available resources and support systems, Assess for adequacy in community support network, Educate family and significant other(s) on suicide prevention, Complete Psychosocial Assessment, Interpersonal group therapy.  Evaluation of Outcomes: Not Progressing   Progress in Treatment: Attending groups: Yes. Participating in groups: Yes. Taking medication as prescribed: Yes. Toleration medication: Yes. Family/Significant other contact made: Yes, individual(s) contacted:  SPE completed with ex-wife, Roberts Gaudy Patient understands diagnosis: Yes. Discussing patient identified problems/goals with staff: Yes. Medical problems stabilized or resolved: Yes. Denies suicidal/homicidal ideation: Yes. Issues/concerns per patient self-inventory: No. Other: None   New problem(s) identified: No, Describe:  None.  New Short Term/Long Term Goal(s): detox, elimination of symptoms of psychosis, medication management for mood stabilization; elimination of SI thoughts; development of comprehensive mental wellness/sobriety plan.  Patient Goals: "I think I need more therapy."   Discharge Plan or Barriers: Patient expressed interest in continued treatment whether outpatient or inpatient.   Reason for Continuation of Hospitalization: Anxiety Depression Hallucinations Suicidal ideation Withdrawal symptoms  Estimated Length of Stay: 1-7 days  Attendees: Patient: Ivan Hamilton 05/13/2020 10:20 AM  Physician: Selina Cooley, MD 05/13/2020 10:20 AM  Nursing:  Graceann Congress, RN 05/13/2020 10:20 AM  RN Care Manager: 05/13/2020 10:20 AM  Social Worker: Assunta Curtis, MSW, LCSW 05/13/2020 10:20 AM  Recreational Therapist: Roanna Epley, CTRS, LRT 05/13/2020 10:20 AM  Other: Chalmers Guest. Guerry Bruin, MSW, [place], [place] 05/13/2020 10:20 AM  Other:  05/13/2020 10:20 AM  Other: 05/13/2020 10:20 AM    Scribe for Treatment Team: Shirl Harris, LCSW 05/13/2020 10:20 AM

## 2020-05-13 NOTE — Progress Notes (Signed)
Recreation Therapy Notes  Date: 05/13/2020  Time: 9:30 am   Location: Craft room   Behavioral response: Appropriate  Intervention Topic: Communication   Discussion/Intervention:  Group content today was focused on communication. The group defined communication and ways to communicate with others. Individuals stated reason why communication is important and some reasons to communicate with others. Patients expressed if they thought they were good at communicating with others and ways they could improve their communication skills. The group identified important parts of communication and some experiences they have had in the past with communication. The group participated in the intervention "What is that?", where they had a chance to test out their communication skills and identify ways to improve their communication techniques.  Clinical Observations/Feedback: Patient came to group and expressed that communication is just understanding and talking with others. He stated that communication is important to get help. Participant explained that he sometimes he has trouble with the language barriers when he communicates.  Individual was social with peers and staff while participating in the intervention.  Melyna Huron LRT/CTRS           Alline Pio 05/13/2020 11:58 AM

## 2020-05-13 NOTE — Progress Notes (Signed)
D: Pt alert and oriented. Pt rates depression 8/10, hopelessness 8/10, and anxiety 9/10. Pt reports energy level as low and concentration as being poor. Pt reports sleep last night as being poor. Pt did not receive medications for sleep. Pt reports experiencing 5/10 headache, right side abdominal, and back pain, prn meds given. Pt denies experiencing any SI/HI, or AVH at this time.   A: Scheduled medications administered to pt, per MD orders. Support and encouragement provided. Frequent verbal contact made. Routine safety checks conducted q15 minutes.   R: No adverse drug reactions noted. Pt verbally contracts for safety at this time. Pt complaint with medications. Pt interacts well with others on the unit. Pt remains safe at this time. Will continue to monitor.

## 2020-05-13 NOTE — Progress Notes (Signed)
[facility] MD Progress Note  05/13/2020 10:28 AM Ivan Hamilton  MRN:  [id number]   Subjective:  Patient seen this morning. He continues to endorse severe depression with reports of feeling he would be better off dead. He continues to have severe PTSD symptoms of flashbacks, intrusive thoughts, hypervigilance, and nightmares. He also continues to endorse auditory hallucinations and visual hallucinations at night. He feels he needs a therapist to help address PTSD in addition to adjusting his medications. He denies active suicidal thoughts or homicidal ideations. Patient is tearful on exam discussing his severe PTSD symptoms. He is agreeable to trying inpatient substance abuse treatment as his alcohol use disorder significantly contributes to his depression and PTSD.   Principal Problem: Severe recurrent major depressive disorder with psychotic features ([place]) Diagnosis: Principal Problem:   Severe recurrent major depressive disorder with psychotic features ([place]) Active Problems:   PTSD (post-traumatic stress disorder)   Hypothyroid   Gastric reflux   Alcohol withdrawal (HCC)   Alcohol use disorder, severe, dependence ([place])   Suicidal ideation  Total Time spent with patient: 45 minutes  Past Psychiatric History: Past history of both mental health problems and substance abuse. Prior alcohol problems at times complicated by withdrawal  Past Medical History:  Past Medical History:  Diagnosis Date   Alcohol withdrawal ([place])    B12 deficiency    Chronic pain    CVA (cerebral vascular accident) ([place])    Hypertension    Hypothyroidism    Varicose veins     Past Surgical History:  Procedure Laterality Date   CHOLECYSTECTOMY     ESOPHAGOGASTRODUODENOSCOPY (EGD) WITH PROPOFOL N/A 10/24/2017   Procedure: ESOPHAGOGASTRODUODENOSCOPY (EGD) WITH PROPOFOL;  Surgeon: [name], MD;  Location: [facility] ENDOSCOPY;  Service: Endoscopy;  Laterality: N/A;   ESOPHAGOGASTRODUODENOSCOPY (EGD) WITH  PROPOFOL N/A 07/12/2019   Procedure: ESOPHAGOGASTRODUODENOSCOPY (EGD) WITH PROPOFOL;  Surgeon: Jonathon Bellows, MD;  Location: [facility] ENDOSCOPY;  Service: Gastroenterology;  Laterality: N/A;   LEG SURGERY     Family History: History reviewed. No pertinent family history. Family Psychiatric  History: Family history of alcohol abuse Social History:  Social History   Substance and Sexual Activity  Alcohol Use Yes   Comment: daily- 1 pint of vodka     Social History   Substance and Sexual Activity  Drug Use No    Social History   Socioeconomic History   Marital status: Married    Spouse name: Not on file   Number of children: Not on file   Years of education: Not on file   Highest education level: Not on file  Occupational History   Not on file  Tobacco Use   Smoking status: Never Smoker   Smokeless tobacco: Never Used  Vaping Use   Vaping Use: Never used  Substance and Sexual Activity   Alcohol use: Yes    Comment: daily- 1 pint of vodka   Drug use: No   Sexual activity: Not on file  Other Topics Concern   Not on file  Social History Narrative   Not on file   Social Determinants of Health   Financial Resource Strain:    Difficulty of Paying Living Expenses: Not on file  Food Insecurity:    Worried About [place] in the Last Year: Not on file   Ran Out of Food in the Last Year: Not on file  Transportation Needs:    Lack of Transportation (Medical): Not on file   Lack of Transportation (Non-Medical): Not on file  Physical Activity:    Days of Exercise per Week: Not on file   Minutes of Exercise per Session: Not on file  Stress:    Feeling of Stress : Not on file  Social Connections:    Frequency of Communication with Friends and Family: Not on file   Frequency of Social Gatherings with Friends and Family: Not on file   Attends Religious Services: Not on file   Active Member of Clubs or Organizations: Not on file   Attends English as a second language teacher Meetings: Not on file   Marital Status: Not on file   Additional Social History:       Sleep: Poor  Appetite:  Fair  Current Medications: Current Facility-Administered Medications  Medication Dose Route Frequency Provider Last Rate Last Admin   acetaminophen (TYLENOL) tablet 650 mg  650 mg Oral Q6H PRN Clapacs, Madie Reno, MD   650 mg at 05/13/20 0808   alum & mag hydroxide-simeth (MAALOX/MYLANTA) 200-200-20 MG/5ML suspension 30 mL  30 mL Oral Q4H PRN Clapacs, Madie Reno, MD       FLUoxetine (PROZAC) capsule 60 mg  60 mg Oral Q1200 Clapacs, John T, MD   60 mg at 05/12/20 1254   gabapentin (NEURONTIN) tablet 600 mg  600 mg Oral TID Clapacs, Madie Reno, MD   600 mg at 05/13/20 1937   hydrOXYzine (ATARAX/VISTARIL) tablet 25 mg  25 mg Oral Q8H PRN Clapacs, Madie Reno, MD   25 mg at 05/12/20 1717   ibuprofen (ADVIL) tablet 600 mg  600 mg Oral Q8H PRN Clapacs, Madie Reno, MD       levothyroxine (SYNTHROID) tablet 125 mcg  125 mcg Oral Q0600 Clapacs, Madie Reno, MD   125 mcg at 05/13/20 0641   LORazepam (ATIVAN) injection 0-4 mg  0-4 mg Intravenous Q12H Clapacs, Madie Reno, MD       Or   LORazepam (ATIVAN) tablet 0-4 mg  0-4 mg Oral Q12H Clapacs, John T, MD   2 mg at 05/12/20 1254   magnesium hydroxide (MILK OF MAGNESIA) suspension 30 mL  30 mL Oral Daily PRN Clapacs, Madie Reno, MD       QUEtiapine (SEROQUEL) tablet 100 mg  100 mg Oral QHS Salley Scarlet, MD       thiamine tablet 100 mg  100 mg Oral Daily Clapacs, Madie Reno, MD   100 mg at 05/13/20 9024   Or   thiamine (B-1) injection 100 mg  100 mg Intravenous Daily Clapacs, Madie Reno, MD       traZODone (DESYREL) tablet 100 mg  100 mg Oral QHS PRN Clapacs, Madie Reno, MD   100 mg at 05/12/20 0151    Lab Results:  Results for orders placed or performed during the hospital encounter of 05/11/20 (from the past 48 hour(s))  Hemoglobin A1c     Status: None   Collection Time: 05/12/20  9:21 AM  Result Value Ref Range   Hgb A1c MFr Bld 5.3 4.8 - 5.6  %    Comment: (NOTE)         Prediabetes: 5.7 - 6.4         Diabetes: >6.4         Glycemic control for adults with diabetes: <7.0    Mean Plasma Glucose 105 mg/dL    Comment: (NOTE) Performed At: [facility] [place], [place] [id number] Rush Farmer MD ME:[id number]   Lipid panel     Status: Abnormal   Collection Time: 05/12/20  9:21 AM  Result Value Ref Range   Cholesterol 207 (H) 0 - 200 mg/dL   Triglycerides 72 <150 mg/dL   HDL 90 >40 mg/dL   Total CHOL/HDL Ratio 2.3 RATIO   VLDL 14 0 - 40 mg/dL   LDL Cholesterol 103 (H) 0 - 99 mg/dL    Comment:        Total Cholesterol/HDL:CHD Risk Coronary Heart Disease Risk Table                     Men   Women  1/2 Average Risk   3.4   3.3  Average Risk       5.0   4.4  2 X Average Risk   9.6   7.1  3 X Average Risk  23.4   11.0        Use the calculated Patient Ratio above and the CHD Risk Table to determine the patient's CHD Risk.        ATP III CLASSIFICATION (LDL):  <100     mg/dL   Optimal  100-129  mg/dL   Near or Above                    Optimal  130-159  mg/dL   Borderline  160-189  mg/dL   High  >190     mg/dL   Very High Performed at [facility], [address] [place], [place] [address]   TSH     Status: None   Collection Time: 05/12/20  9:21 AM  Result Value Ref Range   TSH 2.618 0.350 - 4.500 uIU/mL    Comment: Performed by a 3rd Generation assay with a functional sensitivity of <=0.01 uIU/mL. Performed at [facility], [address] [place], [place] [address]     Blood Alcohol level:  Lab Results  Component Value Date   ETH 379 [facility]) 05/09/2020   ETH 471 (HH) 38/45/3646    Metabolic Disorder Labs: Lab Results  Component Value Date   HGBA1C 5.3 05/12/2020   MPG 105 05/12/2020   MPG 116.89 08/29/2018   No results found for: PROLACTIN Lab Results  Component Value Date   CHOL 207 (H) 05/12/2020   TRIG 72 05/12/2020   HDL 90 05/12/2020   CHOLHDL  2.3 05/12/2020   VLDL 14 05/12/2020   LDLCALC 103 (H) 05/12/2020   LDLCALC 96 08/29/2018    Physical Findings: AIMS: Facial and Oral Movements Muscles of Facial Expression: None, normal Lips and Perioral Area: None, normal Jaw: None, normal Tongue: None, normal,Extremity Movements Upper (arms, wrists, hands, fingers): None, normal Lower (legs, knees, ankles, toes): None, normal, Trunk Movements Neck, shoulders, hips: None, normal, Overall Severity Severity of abnormal movements (highest score from questions above): None, normal Incapacitation due to abnormal movements: None, normal Patient's awareness of abnormal movements (rate only patient's report): No Awareness, Dental Status Current problems with teeth and/or dentures?: No Does patient usually wear dentures?: No  CIWA:  CIWA-Ar Total: 3 COWS:     Musculoskeletal: Strength & Muscle Tone: within normal limits Gait & Station: normal Patient leans: N/A  Psychiatric Specialty Exam: Physical Exam Vitals and nursing note reviewed.  Constitutional:      Appearance: Normal appearance.  HENT:     Head: Normocephalic and atraumatic.     Right Ear: External ear normal.     Left Ear: External ear normal.     Nose: Nose normal.     Mouth/Throat:  Mouth: Mucous membranes are moist.     Pharynx: Oropharynx is clear.  Eyes:     Extraocular Movements: Extraocular movements intact.     Conjunctiva/sclera: Conjunctivae normal.     Pupils: Pupils are equal, round, and reactive to light.  Cardiovascular:     Rate and Rhythm: Normal rate.     Pulses: Normal pulses.  Pulmonary:     Effort: Pulmonary effort is normal.     Breath sounds: Normal breath sounds.  Abdominal:     General: Abdomen is flat.     Palpations: Abdomen is soft.  Musculoskeletal:        General: No swelling. Normal range of motion.     Cervical back: Normal range of motion and neck supple.  Skin:    General: Skin is warm and dry.  Neurological:      General: No focal deficit present.     Mental Status: He is alert and oriented to person, place, and time.  Psychiatric:        Attention and Perception: He perceives auditory and visual hallucinations.        Mood and Affect: Mood is depressed. Affect is tearful.        Behavior: Behavior is agitated.        Cognition and Memory: Cognition is impaired.        Judgment: Judgment is impulsive.     Review of Systems  Constitutional: Negative for activity change and appetite change.  HENT: Negative for rhinorrhea and sore throat.   Eyes: Negative for photophobia and visual disturbance.  Respiratory: Negative for cough and shortness of breath.   Cardiovascular: Negative for chest pain and palpitations.  Gastrointestinal: Negative for constipation, diarrhea, nausea and vomiting.  Endocrine: Negative for cold intolerance and heat intolerance.  Genitourinary: Negative for difficulty urinating and dysuria.  Musculoskeletal: Positive for back pain and myalgias.  Skin: Negative for rash and wound.  Allergic/Immunologic: Negative for environmental allergies and food allergies.  Neurological: Negative for dizziness and headaches.  Hematological: Negative for adenopathy. Does not bruise/bleed easily.  Psychiatric/Behavioral: Positive for decreased concentration, dysphoric mood, hallucinations and sleep disturbance. The patient is nervous/anxious.     Blood pressure 124/84, pulse 88, temperature 97.8 F (36.6 C), temperature source Oral, resp. rate 18, height 5\' 5"  (1.651 m), weight 63 kg, SpO2 99 %.Body mass index is 23.13 kg/m.  General Appearance: Fairly Groomed  Eye Contact:  Good  Speech:  Normal Rate  Volume:  Normal  Mood:  Depressed and Dysphoric  Affect:  Congruent and Depressed  Thought Process:  Tangential  Orientation:  Full (Time, Place, and Person)  Thought Content:  Rumination and Tangential  Suicidal Thoughts:  Yes.  without intent/plan  Homicidal Thoughts:  No  Memory:   Immediate;   Fair Recent;   Fair Remote;   Fair  Judgement:  Intact  Insight:  Fair  Psychomotor Activity:  Normal  Concentration:  Concentration: Fair and Attention Span: Fair  Recall:  [employer] of Knowledge:  Fair  Language:  Good  Akathisia:  Negative  Handed:  Right  AIMS (if indicated):     Assets:  Communication Skills Desire for Improvement Financial Resources/Insurance Housing Resilience  ADL's:  Intact  Cognition:  WNL  Sleep:        Treatment Plan Summary: Daily contact with patient to assess and evaluate symptoms and progress in treatment, Medication management and Plan increase seroquel to 100 mg nightly for mood, hallucinations, and sleep. While patietn does  not have active suicide plan, he continues to feel he would be better off dead. He is actively tearful and dysphoric on exam. He warrants continued inpatient stay for mood stabilization. He remains a danger to himself at this time.  Salley Scarlet, MD 05/13/2020, 10:28 AM

## 2020-05-13 NOTE — Plan of Care (Signed)
  Problem: Education: Goal: Emotional status will improve Outcome: Progressing   Problem: Safety: Goal: Periods of time without injury will increase Outcome: Progressing

## 2020-05-13 NOTE — Progress Notes (Signed)
Patient sleep most of shift, Denies SI, HI, AVH. Voiced no concerns or complaints. Encouragement and support provided. Safety checks maintained. Medications given as prescribed. Pt receptive and remains safe on unit with q 15 min checks.

## 2020-05-13 NOTE — BHH Group Notes (Signed)
LCSW Group Therapy Note  05/13/2020 2:04 PM  Type of Therapy and Topic:  Group Therapy:  Feelings around Relapse and Recovery  Participation Level:  Active   Description of Group:    Patients in this group will discuss emotions they experience before and after a relapse. They will process how experiencing these feelings, or avoidance of experiencing them, relates to having a relapse. Facilitator will guide patients to explore emotions they have related to recovery. Patients will be encouraged to process which emotions are more powerful. They will be guided to discuss the emotional reaction significant others in their lives may have to their relapse or recovery. Patients will be assisted in exploring ways to respond to the emotions of others without this contributing to a relapse.  Therapeutic Goals: 1. Patient will identify two or more emotions that lead to a relapse for them 2. Patient will identify two emotions that result when they relapse 3. Patient will identify two emotions related to recovery 4. Patient will demonstrate ability to communicate their needs through discussion and/or role plays   Summary of Patient Progress: Patient was present for group. Patient became upset when CSW explained that visitors are not allowed to the unit.  Patient was difficult to remain on topic, despite CSW re-framing or attempting to redirect the patient to the topic at hand. 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  Therapeutic Modalities:   Cognitive Behavioral Therapy Solution-Focused Therapy Assertiveness Training Relapse Prevention Therapy   Assunta Curtis, MSW, LCSW 05/13/2020 2:04 PM

## 2020-05-13 NOTE — BHH Counselor (Addendum)
CSW met with patient to discuss inpatient placement options. Patient inquired regarding unit visitation policies. CSW explained that visitation is not happening due to COVID. He stated that patients upstairs were allowed to have visitors and he did not understand why patient's could not have visitors on this unit. CSW explained that this is the restrictions for this unit and that if he wanted further explanation he could speak due the nurses or the unit director. He declined stating that he had already spoken with the nurses and others who told him they were just doing what they were told. Patient went on to state that he did not want to go to another facility with such severe restrictions. Patient spoke at length about his previous experience while at [facility]. CSW validated his feelings and encouraged him to refocus on the discussion at hand. CSW clarified that patient was interested in outpatient treatment with AA meetings and that he would be interested in inpatient treatment somewhere less restrictive. CSW informed patient that [facility] may be another option for outpatient treatment. Patient and CSW discussed some options for inpatient briefly. He stated that he was interested in the shortest option which was only 21 days ([facility]). Pt began to discuss his previous experience with them and that he liked the place prior to returning to his conversation about [facility]. CSW apologized for having to cut patient off and informed him that to begin working on his behalf CSW had to return to his office. CSW confirmed what they were going to work on. Patient agreed. Patient inquired regarding length of stay, which was explained to him. He asked if CSW would continue to work on his behalf after he was discharged. CSW explained that once patient leaves the facility, he is no longer a patient, and that CSW could not continue to provide services for him. Again CSW reviewed patient's interest regarding  aftercare and informed him that they would begin working on these. He agreed. No other issues expressed. Contact ended without incident.   CSW faxed paperwork over to [facility] for review. CSW contacted [facility] and was informed that they do not take [employer]. CSW contacted [facility] regarding any male bed availability in the halfway house, without giving any patient information, and was informed that they were at capacity. CSW will continue to follow.   Chalmers Guest. Guerry Bruin, MSW, [place], [place] 05/13/2020 3:25 PM

## 2020-05-14 LAB — HEPATIC FUNCTION PANEL
ALT: 54 U/L — ABNORMAL HIGH (ref 0–44)
AST: 75 U/L — ABNORMAL HIGH (ref 15–41)
Albumin: 3.8 g/dL (ref 3.5–5.0)
Alkaline Phosphatase: 90 U/L (ref 38–126)
Bilirubin, Direct: <0.1 mg/dL (ref 0.0–0.2)
Total Bilirubin: 0.6 mg/dL (ref 0.3–1.2)
Total Protein: 7.9 g/dL (ref 6.5–8.1)

## 2020-05-14 MED ORDER — QUETIAPINE FUMARATE 200 MG PO TABS
200.0000 mg | ORAL_TABLET | Freq: Every day | ORAL | Status: DC
  Administered 2020-05-14 – 2020-05-17 (×4): 200 mg via ORAL
  Filled 2020-05-14 (×4): qty 1

## 2020-05-14 NOTE — Plan of Care (Addendum)
Pt rates depression 8/10, hopelessness 5/10 and anxiety 10/10. Pt denies SI, HI and AVH. Pt was educated on care plan and verbalizes understanding. Collier Bullock RN Problem: Education: Goal: Knowledge of [place] [facility] Education information/materials will improve Outcome: Progressing Goal: Emotional status will improve Outcome: Progressing Goal: Mental status will improve Outcome: Progressing Goal: Verbalization of understanding the information provided will improve Outcome: Progressing   Problem: Activity: Goal: Interest or engagement in activities will improve Outcome: Progressing Goal: Sleeping patterns will improve Outcome: Progressing   Problem: Coping: Goal: Ability to verbalize frustrations and anger appropriately will improve Outcome: Progressing Goal: Ability to demonstrate self-control will improve Outcome: Progressing   Problem: Health Behavior/Discharge Planning: Goal: Identification of resources available to assist in meeting health care needs will improve Outcome: Progressing Goal: Compliance with treatment plan for underlying cause of condition will improve Outcome: Progressing   Problem: Physical Regulation: Goal: Ability to maintain clinical measurements within normal limits will improve Outcome: Progressing   Problem: Safety: Goal: Periods of time without injury will increase Outcome: Progressing   Problem: Education: Goal: Knowledge of disease or condition will improve Outcome: Progressing Goal: Understanding of discharge needs will improve Outcome: Progressing   Problem: Health Behavior/Discharge Planning: Goal: Ability to identify changes in lifestyle to reduce recurrence of condition will improve Outcome: Progressing Goal: Identification of resources available to assist in meeting health care needs will improve Outcome: Progressing   Problem: Physical Regulation: Goal: Complications related to the disease process, condition or treatment will  be avoided or minimized Outcome: Progressing   Problem: Safety: Goal: Ability to remain free from injury will improve Outcome: Progressing   Problem: Education: Goal: Ability to make informed decisions regarding treatment will improve Outcome: Progressing   Problem: Coping: Goal: Coping ability will improve Outcome: Progressing   Problem: Health Behavior/Discharge Planning: Goal: Identification of resources available to assist in meeting health care needs will improve Outcome: Progressing   Problem: Medication: Goal: Compliance with prescribed medication regimen will improve Outcome: Progressing   Problem: Self-Concept: Goal: Ability to disclose and discuss suicidal ideas will improve Outcome: Progressing Goal: Will verbalize positive feelings about self Outcome: Progressing

## 2020-05-14 NOTE — Progress Notes (Signed)
Patient cooperative with treatment, he was complaint with medications, he voiced concern of seeing people outside of his window before he went to bed , but the patient had just taken night medications and went to bed slowly. He seemed to sleep well through out the night.

## 2020-05-14 NOTE — BHH Group Notes (Signed)
[facility] Group Notes:  (Nursing/MHT/Case Management/Adjunct)  Date:  05/14/2020  Time:  8:38 PM  Type of Therapy:  Group Therapy  Participation Level:  Active  Participation Quality:  Appropriate  Affect:  Appropriate  Cognitive:  Alert  Insight:  Good  Engagement in Group:  Engaged and say had a good day.  Modes of Intervention:  Support  Summary of Progress/Problems:  Ivan Hamilton 05/14/2020, 8:38 PM

## 2020-05-14 NOTE — BHH Group Notes (Signed)
Patient was present in [place] group.   Assunta Curtis, MSW, LCSW 05/14/2020 2:23 PM

## 2020-05-14 NOTE — Progress Notes (Addendum)
[facility] MD Progress Note  05/14/2020 11:04 AM Ivan Hamilton  MRN:  [id number]   Subjective: She is being seen for the first time today.  Arises events that transpired before admission.  Patient reports experiencing visual hallucinations last night, mild and transient auditory hallucinations.  Contends that his psychosis has improved since being here.  Also he feels better in terms of his emotions although depression is ongoing, rates it to be a 7 out of 10 with 10 being high.  No's thoughts of suicide or homicide.  Reports some tremors from alcohol withdrawal.  Says that he experienced severe right upper quadrant pain that radiated to his back last night, it is now moderate.  Says that he had a cholecystectomy years ago.  He slept for a couple hours last night.  Says that he is afraid to sleep a lot because of nightmares.  Does not take as needed trazodone last night.  Does it at home, trazodone usually does not help.  He is taking hydroxyzine with little benefit.  Reviews that he drank much more before 2 months ago and not a whole lot until this recent event.  Principal Problem: Severe recurrent major depressive disorder with psychotic features ([place]) Diagnosis: Principal Problem:   Severe recurrent major depressive disorder with psychotic features ([place]) Active Problems:   PTSD (post-traumatic stress disorder)   Hypothyroid   Gastric reflux   Alcohol withdrawal ([facility])   Alcohol use disorder, severe, dependence ([place])   Suicidal ideation  Total Time spent with patient: 45 minutes  Past Psychiatric History: Past history of both mental health problems and substance abuse. Prior alcohol problems at times complicated by withdrawal  Past Medical History:  Past Medical History:  Diagnosis Date  . Alcohol withdrawal ([place])   . B12 deficiency   . Chronic pain   . CVA (cerebral vascular accident) ([place])   . Hypertension   . Hypothyroidism   . Varicose veins     Past Surgical History:   Procedure Laterality Date  . CHOLECYSTECTOMY    . ESOPHAGOGASTRODUODENOSCOPY (EGD) WITH PROPOFOL N/A 10/24/2017   Procedure: ESOPHAGOGASTRODUODENOSCOPY (EGD) WITH PROPOFOL;  Surgeon: [name], MD;  Location[facility] ENDOSCOPY;  Service: Endoscopy;  Laterality: N/A;  . ESOPHAGOGASTRODUODENOSCOPY (EGD) WITH PROPOFOL N/A 07/12/2019   Procedure: ESOPHAGOGASTRODUODENOSCOPY (EGD) WITH PROPOFOL;  Surgeon: Jonathon Bellows, MD;  Location: [facility] ENDOSCOPY;  Service: Gastroenterology;  Laterality: N/A;  . LEG SURGERY     Family History: History reviewed. No pertinent family history. Family Psychiatric  History: Family history of alcohol abuse Social History:  Social History   Substance and Sexual Activity  Alcohol Use Yes   Comment: daily- 1 pint of vodka     Social History   Substance and Sexual Activity  Drug Use No    Social History   Socioeconomic History  . Marital status: Married    Spouse name: Not on file  . Number of children: Not on file  . Years of education: Not on file  . Highest education level: Not on file  Occupational History  . Not on file  Tobacco Use  . Smoking status: Never Smoker  . Smokeless tobacco: Never Used  Vaping Use  . Vaping Use: Never used  Substance and Sexual Activity  . Alcohol use: Yes    Comment: daily- 1 pint of vodka  . Drug use: No  . Sexual activity: Not on file  Other Topics Concern  . Not on file  Social History Narrative  . Not on file  Social Determinants of Health   Financial Resource Strain:   . Difficulty of Paying Living Expenses: Not on file  Food Insecurity:   . Worried About Charity fundraiser in the Last Year: Not on file  . Ran Out of Food in the Last Year: Not on file  Transportation Needs:   . Lack of Transportation (Medical): Not on file  . Lack of Transportation (Non-Medical): Not on file  Physical Activity:   . Days of Exercise per Week: Not on file  . Minutes of Exercise per Session: Not on file  Stress:   .  Feeling of Stress : Not on file  Social Connections:   . Frequency of Communication with Friends and Family: Not on file  . Frequency of Social Gatherings with Friends and Family: Not on file  . Attends Religious Services: Not on file  . Active Member of Clubs or Organizations: Not on file  . Attends Archivist Meetings: Not on file  . Marital Status: Not on file   Additional Social History:       Sleep: Poor  Appetite:  Fair  Current Medications: Current Facility-Administered Medications  Medication Dose Route Frequency Provider Last Rate Last Admin  . acetaminophen (TYLENOL) tablet 650 mg  650 mg Oral Q6H PRN Clapacs, Madie Reno, MD   650 mg at 05/14/20 0651  . alum & mag hydroxide-simeth (MAALOX/MYLANTA) 200-200-20 MG/5ML suspension 30 mL  30 mL Oral Q4H PRN Clapacs, John T, MD      . FLUoxetine (PROZAC) capsule 60 mg  60 mg Oral Q1200 Clapacs, Madie Reno, MD   60 mg at 05/13/20 1222  . gabapentin (NEURONTIN) tablet 600 mg  600 mg Oral TID Clapacs, John T, MD   600 mg at 05/14/20 0831  . hydrOXYzine (ATARAX/VISTARIL) tablet 25 mg  25 mg Oral Q8H PRN Clapacs, Madie Reno, MD   25 mg at 05/13/20 1223  . ibuprofen (ADVIL) tablet 600 mg  600 mg Oral Q8H PRN Clapacs, John T, MD      . levothyroxine (SYNTHROID) tablet 125 mcg  125 mcg Oral Q0600 Clapacs, Madie Reno, MD   125 mcg at 05/14/20 [phone]  . magnesium hydroxide (MILK OF MAGNESIA) suspension 30 mL  30 mL Oral Daily PRN Clapacs, John T, MD      . QUEtiapine (SEROQUEL) tablet 200 mg  200 mg Oral QHS Rulon Sera, MD      . thiamine tablet 100 mg  100 mg Oral Daily Clapacs, John T, MD   100 mg at 05/14/20 0831   Or  . thiamine (B-1) injection 100 mg  100 mg Intravenous Daily Clapacs, John T, MD      . traZODone (DESYREL) tablet 100 mg  100 mg Oral QHS PRN Clapacs, Madie Reno, MD   100 mg at 05/12/20 0151    Lab Results:  No results found for this or any previous visit (from the past 67 hour(s)).  Blood Alcohol level:  Lab Results   Component Value Date   ETH 379 (HH) 05/09/2020   ETH 471 (HH) 62/13/0865    Metabolic Disorder Labs: Lab Results  Component Value Date   HGBA1C 5.3 05/12/2020   MPG 105 05/12/2020   MPG 116.89 08/29/2018   No results found for: PROLACTIN Lab Results  Component Value Date   CHOL 207 (H) 05/12/2020   TRIG 72 05/12/2020   HDL 90 05/12/2020   CHOLHDL 2.3 05/12/2020   VLDL 14 05/12/2020   LDLCALC 103 (H)  05/12/2020   LDLCALC 96 08/29/2018    Physical Findings: AIMS: Facial and Oral Movements Muscles of Facial Expression: None, normal Lips and Perioral Area: None, normal Jaw: None, normal Tongue: None, normal,Extremity Movements Upper (arms, wrists, hands, fingers): None, normal Lower (legs, knees, ankles, toes): None, normal, Trunk Movements Neck, shoulders, hips: None, normal, Overall Severity Severity of abnormal movements (highest score from questions above): None, normal Incapacitation due to abnormal movements: None, normal Patient's awareness of abnormal movements (rate only patient's report): No Awareness, Dental Status Current problems with teeth and/or dentures?: No Does patient usually wear dentures?: No  CIWA:  CIWA-Ar Total: 2 COWS:     Musculoskeletal: Strength & Muscle Tone: within normal limits Gait & Station: normal Patient leans: N/A  Psychiatric Specialty Exam: ROS -  10 point ROS is negative, see HPI    Blood pressure 103/79, pulse 87, temperature 97.6 F (36.4 C), temperature source Oral, resp. rate 18, height 5\' 5"  (1.651 m), weight 63 kg, SpO2 97 %.Body mass index is 23.13 kg/m.  General Appearance: Fairly Groomed  Eye Contact:  Good  Speech:  Normal Rate  Volume:  Normal  Mood:  dysphoric  Affect:  retricted  Thought Process:  Tangential  Orientation:  Full (Time, Place, and Person)  Thought Content:  Rumination and Tangential  Suicidal Thoughts:  Yes.  without intent/plan  Homicidal Thoughts:  No  Memory:  Immediate;   Fair Recent;    Fair Remote;   Fair  Judgement:  Intact  Insight:  Fair  Psychomotor Activity:  Normal  Concentration:  Concentration: Fair and Attention Span: Fair  Recall:  [employer] of Knowledge:  Fair  Language:  Good  Akathisia:  Negative  Handed:  Right  AIMS (if indicated):     Assets:  Communication Skills Desire for Improvement Financial Resources/Insurance Housing Resilience  ADL's:  Intact  Cognition:  WNL  Sleep:  Number of Hours: 8.15     Treatment Plan Summary: Daily contact with patient to assess and evaluate symptoms and progress in treatment, Medication management and Plan increase seroquel to 100 mg nightly for mood, hallucinations, and sleep. While patietn does not have active suicide plan, he continues to feel he would be better off dead. He is actively tearful and dysphoric on exam. He warrants continued inpatient stay for mood stabilization. He remains a danger to himself at this time.  05/14/2020 Increase Seroquel to 200 mg night Consider Minipress 1 mg nightly Will consult hospitalist right upper quadrant pain Obtain hepatic panel Continue CIWA protocol Vitals are within normal limits No signs of DTs Rulon Sera, MD 05/14/2020, 11:04 AM

## 2020-05-14 NOTE — [facility] Group Notes (Signed)
LCSW Group Therapy Note  05/14/2020  1:00 PM   Type of Therapy and Topic:  Group Therapy:  Trust and Honesty   Participation Level:  Minimal   Description of Group:    In this group patients will be asked to explore the value of being honest.  Patients will be guided to discuss their thoughts, feelings, and behaviors related to honesty and trusting in others. Patients will process together how trust and honesty relate to forming relationships with peers, family members, and self. Each patient will be challenged to identify and express feelings of being vulnerable. Patients will discuss reasons why people are dishonest and identify alternative outcomes if one was truthful (to self or others). This group will be process-oriented, with patients participating in exploration of their own experiences, giving and receiving support, and processing challenge from other group members.   Therapeutic Goals: 1. Patient will identify why honesty is important to relationships and how honesty overall affects relationships.  2. Patient will identify a situation where they lied or were lied too and the  feelings, thought process, and behaviors surrounding the situation 3. Patient will identify the meaning of being vulnerable, how that feels, and how that correlates to being honest with self and others. 4. Patient will identify situations where they could have told the truth, but instead lied and explain reasons of dishonesty.   Summary of Patient Progress: Patient was present in group.  Patient was supportive of other groups.  Patient shared that trust is important because it leads to support.  Patient had to leave group early for labs. Patient had not returned by the end of group.     Therapeutic Modalities:   Cognitive Behavioral Therapy Solution Focused Therapy Motivational Interviewing Brief Therapy  Assunta Curtis, MSW, LCSW 05/14/2020 10:57 AM

## 2020-05-14 NOTE — Progress Notes (Signed)
D- Patient alert and oriented. Pt affect/mood is calm and coopertive. Pt denies SI, HI. Pt attended groups but did not go outside. He said that it might mess with his head.   A- Scheduled medications administered to patient, per MD orders. Support and encouragement provided.  Routine safety checks conducted every 15 minutes.  Patient informed to notify staff with problems or concerns.  R- No adverse drug reactions noted. Patient contracts for safety at this time. Patient compliant with medications and treatment plan. Patient receptive, calm, and cooperative. Patient interacts well with others on the unit.  Patient remains safe at this time.  Collier Bullock RN

## 2020-05-15 ENCOUNTER — Inpatient Hospital Stay: Payer: Medicare HMO

## 2020-05-15 LAB — CBC WITH DIFFERENTIAL/PLATELET
Abs Immature Granulocytes: 0.02 10*3/uL (ref 0.00–0.07)
Basophils Absolute: 0 10*3/uL (ref 0.0–0.1)
Basophils Relative: 1 %
Eosinophils Absolute: 0.1 10*3/uL (ref 0.0–0.5)
Eosinophils Relative: 3 %
HCT: 33.1 % — ABNORMAL LOW (ref 39.0–52.0)
Hemoglobin: 11 g/dL — ABNORMAL LOW (ref 13.0–17.0)
Immature Granulocytes: 1 %
Lymphocytes Relative: 32 %
Lymphs Abs: 1.3 10*3/uL (ref 0.7–4.0)
MCH: 27.4 pg (ref 26.0–34.0)
MCHC: 33.2 g/dL (ref 30.0–36.0)
MCV: 82.5 fL (ref 80.0–100.0)
Monocytes Absolute: 0.3 10*3/uL (ref 0.1–1.0)
Monocytes Relative: 9 %
Neutro Abs: 2.2 10*3/uL (ref 1.7–7.7)
Neutrophils Relative %: 54 %
Platelets: 142 10*3/uL — ABNORMAL LOW (ref 150–400)
RBC: 4.01 MIL/uL — ABNORMAL LOW (ref 4.22–5.81)
RDW: 20.3 % — ABNORMAL HIGH (ref 11.5–15.5)
WBC: 4 10*3/uL (ref 4.0–10.5)
nRBC: 0 % (ref 0.0–0.2)

## 2020-05-15 LAB — AMYLASE: Amylase: 121 U/L — ABNORMAL HIGH (ref 28–100)

## 2020-05-15 LAB — LIPASE, BLOOD: Lipase: 45 U/L (ref 11–51)

## 2020-05-15 MED ORDER — TEMAZEPAM 15 MG PO CAPS
15.0000 mg | ORAL_CAPSULE | Freq: Every day | ORAL | Status: DC
  Administered 2020-05-15 – 2020-05-17 (×3): 15 mg via ORAL
  Filled 2020-05-15 (×3): qty 1

## 2020-05-15 MED ORDER — FLUOXETINE HCL 20 MG PO CAPS
80.0000 mg | ORAL_CAPSULE | Freq: Every day | ORAL | Status: DC
  Administered 2020-05-15 – 2020-05-18 (×4): 80 mg via ORAL
  Filled 2020-05-15 (×4): qty 4

## 2020-05-15 NOTE — Progress Notes (Signed)
D: Pt alert and oriented. Pt rates depression 7/10, hopelessness 5/10, and anxiety 8/10. Pt reports energy level as low and concentration as being poor. Pt reports sleep last night as being fair. Pt did receive medications for sleep and did find them helpful. Pt reports experiencing 7/10 low right rib and lower back pain, prn meds given. Pt denies experiencing any SI/HI, or AVH at this time.   Pt had labs drawn today and went to ultrasound.   A: Scheduled medications administered to pt, per MD orders. Support and encouragement provided. Frequent verbal contact made. Routine safety checks conducted q15 minutes.   R: No adverse drug reactions noted. Pt verbally contracts for safety at this time. Pt complaint with medications. Pt interacts well with others on the unit, however mostly self isolates to room other than meals. Pt remains safe at this time. Will continue to monitor.
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
## 2020-05-15 NOTE — Plan of Care (Signed)
  Problem: Education: Goal: Knowledge of [facility] Education information/materials will improve Outcome: Progressing Goal: Emotional status will improve Outcome: Progressing Goal: Mental status will improve Outcome: Progressing Goal: Verbalization of understanding the information provided will improve Outcome: Progressing   

## 2020-05-15 NOTE — [facility] Group Notes (Signed)
[facility] Group Notes: (Clinical Social Work)   05/15/2020      Type of Therapy:  Group Therapy   Participation Level:  Did Not Attend - was invited individually by Nurse/MHT and chose not to attend.   Raina Mina, [place] 05/15/2020  10:24 AM

## 2020-05-15 NOTE — Progress Notes (Addendum)
[facility] MD Progress Note  05/15/2020 9:06 AM Ivan Hamilton  MRN:  [id number]   Subjective:  10/24 Patient indicates that he is feeling very depressed today, he is isolating more, mostly reflecting about how his family split up due to his alcohol addiction.  Says it has been to treatment before and was on a monthly injection while at a rehab which helped curb cravings for alcohol.  Has never been on oral version of this medication, likely referring to naltrexone.  He denies have any suicidal thoughts.  Reports improvement in psychosis.  Yesterday, he heard someone knocking at his room window and then saw a shadow last night of someone being outside.  He then closed the curtains.  Sleep remains poor even when taking trazodone 100 mg with the Seroquel.  He has taken up to 150 mg in the past.  Right upper quadrant pain remains moderate to severe in intensity.  10/23 She is being seen for the first time today.  Arises events that transpired before admission.  Patient reports experiencing visual hallucinations last night, mild and transient auditory hallucinations.  Contends that his psychosis has improved since being here.  Also he feels better in terms of his emotions although depression is ongoing, rates it to be a 7 out of 10 with 10 being high.  No's thoughts of suicide or homicide.  Reports some tremors from alcohol withdrawal.  Says that he experienced severe right upper quadrant pain that radiated to his back last night, it is now moderate.  Says that he had a cholecystectomy years ago.  He slept for a couple hours last night.  Says that he is afraid to sleep a lot because of nightmares.  Does not take as needed trazodone last night.  Does it at home, trazodone usually does not help.  He is taking hydroxyzine with little benefit.  Reviews that he drank much more before 2 months ago and not a whole lot until this recent event.  Principal Problem: Severe recurrent major depressive disorder  with psychotic features ([place]) Diagnosis: Principal Problem:   Severe recurrent major depressive disorder with psychotic features ([place]) Active Problems:   PTSD (post-traumatic stress disorder)   Hypothyroid   Gastric reflux   Alcohol withdrawal (HCC)   Alcohol use disorder, severe, dependence ([place])   Suicidal ideation  Total Time spent with patient: 45 minutes  Past Psychiatric History: Past history of both mental health problems and substance abuse. Prior alcohol problems at times complicated by withdrawal  Past Medical History:  Past Medical History:  Diagnosis Date  . Alcohol withdrawal ([place])   . B12 deficiency   . Chronic pain   . CVA (cerebral vascular accident) ([place])   . Hypertension   . Hypothyroidism   . Varicose veins     Past Surgical History:  Procedure Laterality Date  . CHOLECYSTECTOMY    . ESOPHAGOGASTRODUODENOSCOPY (EGD) WITH PROPOFOL N/A 10/24/2017   Procedure: ESOPHAGOGASTRODUODENOSCOPY (EGD) WITH PROPOFOL;  Surgeon: [name], MD;  Location: [facility] ENDOSCOPY;  Service: Endoscopy;  Laterality: N/A;  . ESOPHAGOGASTRODUODENOSCOPY (EGD) WITH PROPOFOL N/A 07/12/2019   Procedure: ESOPHAGOGASTRODUODENOSCOPY (EGD) WITH PROPOFOL;  Surgeon: Jonathon Bellows, MD;  Location: [facility] ENDOSCOPY;  Service: Gastroenterology;  Laterality: N/A;  . LEG SURGERY     Family History: History reviewed. No pertinent family history. Family Psychiatric  History: Family history of alcohol abuse Social History:  Social History   Substance and Sexual Activity  Alcohol Use Yes   Comment: daily- 1 pint of vodka  Social History   Substance and Sexual Activity  Drug Use No    Social History   Socioeconomic History  . Marital status: Married    Spouse name: Not on file  . Number of children: Not on file  . Years of education: Not on file  . Highest education level: Not on file  Occupational History  . Not on file  Tobacco Use  . Smoking status: Never Smoker  . Smokeless tobacco:  Never Used  Vaping Use  . Vaping Use: Never used  Substance and Sexual Activity  . Alcohol use: Yes    Comment: daily- 1 pint of vodka  . Drug use: No  . Sexual activity: Not on file  Other Topics Concern  . Not on file  Social History Narrative  . Not on file   Social Determinants of Health   Financial Resource Strain:   . Difficulty of Paying Living Expenses: Not on file  Food Insecurity:   . Worried About Charity fundraiser in the Last Year: Not on file  . Ran Out of Food in the Last Year: Not on file  Transportation Needs:   . Lack of Transportation (Medical): Not on file  . Lack of Transportation (Non-Medical): Not on file  Physical Activity:   . Days of Exercise per Week: Not on file  . Minutes of Exercise per Session: Not on file  Stress:   . Feeling of Stress : Not on file  Social Connections:   . Frequency of Communication with Friends and Family: Not on file  . Frequency of Social Gatherings with Friends and Family: Not on file  . Attends Religious Services: Not on file  . Active Member of Clubs or Organizations: Not on file  . Attends Archivist Meetings: Not on file  . Marital Status: Not on file   Additional Social History:       Sleep: Poor  Appetite:  Fair  Current Medications: Current Facility-Administered Medications  Medication Dose Route Frequency Provider Last Rate Last Admin  . acetaminophen (TYLENOL) tablet 650 mg  650 mg Oral Q6H PRN Clapacs, Madie Reno, MD   650 mg at 05/14/20 0651  . alum & mag hydroxide-simeth (MAALOX/MYLANTA) 200-200-20 MG/5ML suspension 30 mL  30 mL Oral Q4H PRN Clapacs, John T, MD      . FLUoxetine (PROZAC) capsule 60 mg  60 mg Oral Q1200 Clapacs, Madie Reno, MD   60 mg at 05/14/20 1206  . gabapentin (NEURONTIN) tablet 600 mg  600 mg Oral TID Clapacs, John T, MD   600 mg at 05/15/20 0817  . hydrOXYzine (ATARAX/VISTARIL) tablet 25 mg  25 mg Oral Q8H PRN Clapacs, Madie Reno, MD   25 mg at 05/15/20 0818  . ibuprofen (ADVIL)  tablet 600 mg  600 mg Oral Q8H PRN Clapacs, Madie Reno, MD   600 mg at 05/15/20 0818  . levothyroxine (SYNTHROID) tablet 125 mcg  125 mcg Oral Q0600 Clapacs, Madie Reno, MD   125 mcg at 05/15/20 [phone]  . magnesium hydroxide (MILK OF MAGNESIA) suspension 30 mL  30 mL Oral Daily PRN Clapacs, John T, MD      . QUEtiapine (SEROQUEL) tablet 200 mg  200 mg Oral QHS Rulon Sera, MD   200 mg at 05/14/20 2126  . thiamine tablet 100 mg  100 mg Oral Daily Clapacs, John T, MD   100 mg at 05/15/20 0817   Or  . thiamine (B-1) injection 100 mg  100 mg Intravenous  Daily Clapacs, Madie Reno, MD      . traZODone (DESYREL) tablet 100 mg  100 mg Oral QHS PRN Clapacs, Madie Reno, MD   100 mg at 05/14/20 2128    Lab Results:  Results for orders placed or performed during the hospital encounter of 05/11/20 (from the past 48 hour(s))  Hepatic function panel     Status: Abnormal   Collection Time: 05/14/20  1:47 PM  Result Value Ref Range   Total Protein 7.9 6.5 - 8.1 g/dL   Albumin 3.8 3.5 - 5.0 g/dL   AST 75 (H) 15 - 41 U/L   ALT 54 (H) 0 - 44 U/L   Alkaline Phosphatase 90 38 - 126 U/L   Total Bilirubin 0.6 0.3 - 1.2 mg/dL   Bilirubin, Direct <0.1 0.0 - 0.2 mg/dL   Indirect Bilirubin NOT CALCULATED 0.3 - 0.9 mg/dL    Comment: Performed at [facility], [address] [place], [place] [address]    Blood Alcohol level:  Lab Results  Component Value Date   ETH 379 (HH) 05/09/2020   ETH 471 (HH) 60/45/4098    Metabolic Disorder Labs: Lab Results  Component Value Date   HGBA1C 5.3 05/12/2020   MPG 105 05/12/2020   MPG 116.89 08/29/2018   No results found for: PROLACTIN Lab Results  Component Value Date   CHOL 207 (H) 05/12/2020   TRIG 72 05/12/2020   HDL 90 05/12/2020   CHOLHDL 2.3 05/12/2020   VLDL 14 05/12/2020   LDLCALC 103 (H) 05/12/2020   LDLCALC 96 08/29/2018    Physical Findings: AIMS: Facial and Oral Movements Muscles of Facial Expression: None, normal Lips and Perioral Area: None,  normal Jaw: None, normal Tongue: None, normal,Extremity Movements Upper (arms, wrists, hands, fingers): None, normal Lower (legs, knees, ankles, toes): None, normal, Trunk Movements Neck, shoulders, hips: None, normal, Overall Severity Severity of abnormal movements (highest score from questions above): None, normal Incapacitation due to abnormal movements: None, normal Patient's awareness of abnormal movements (rate only patient's report): No Awareness, Dental Status Current problems with teeth and/or dentures?: No Does patient usually wear dentures?: No  CIWA:  CIWA-Ar Total: 2 COWS:     Musculoskeletal: Strength & Muscle Tone: within normal limits Gait & Station: normal Patient leans: N/A  Psychiatric Specialty Exam: ROS -  10 point ROS is negative, see HPI    Blood pressure 108/79, pulse 73, temperature 97.9 F (36.6 C), resp. rate 16, height 5\' 5"  (1.651 m), weight 63 kg, SpO2 98 %.Body mass index is 23.13 kg/m.  General Appearance: Fairly Groomed  Eye Contact:  Good  Speech:  Normal Rate  Volume:  Normal  Mood:  dysphoric  Affect:  retricted  Thought Process:  Tangential  Orientation:  Full (Time, Place, and Person)  Thought Content:  Rumination and Tangential  Suicidal Thoughts:  Yes.  without intent/plan  Homicidal Thoughts:  No  Memory:  Immediate;   Fair Recent;   Fair Remote;   Fair  Judgement:  Intact  Insight:  Fair  Psychomotor Activity:  Normal  Concentration:  Concentration: Fair and Attention Span: Fair  Recall:  [employer] of Knowledge:  Fair  Language:  Good  Akathisia:  Negative  Handed:  Right  AIMS (if indicated):     Assets:  Communication Skills Desire for Improvement Financial Resources/Insurance Housing Resilience  ADL's:  Intact  Cognition:  WNL  Sleep:  Number of Hours: 8.15     Treatment Plan Summary: Daily contact  with patient to assess and evaluate symptoms and progress in treatment, Medication management and Plan increase  seroquel to 100 mg nightly for mood, hallucinations, and sleep. While patietn does not have active suicide plan, he continues to feel he would be better off dead. He is actively tearful and dysphoric on exam. He warrants continued inpatient stay for mood stabilization. He remains a danger to himself at this time.  05/15/2020 Hepatic panel reviewed.  Mildly increased LFTs.  Unremarkable bilirubin levels. Obtain right upper quadrant ultrasound Obtain lipase and amylase Obtain CBC. Pt afebrile.  Schedule Restoril 15 mg p.o. nightly temporarily for sleep stabilization.  Patient indicates that he used to sleep very well except for the past 2 months. Consider MAT for alcohol Increase Prozac to 80 mg  05/14/2020 Increase Seroquel to 200 mg night Consider Minipress 1 mg nightly Will consult hospitalist right upper quadrant pain Obtain hepatic panel Continue CIWA protocol Vitals are within normal limits No signs of DTs   Rulon Sera, MD 05/15/2020, 9:06 AM

## 2020-05-16 MED ORDER — BISMUTH SUBSALICYLATE 262 MG PO CHEW
524.0000 mg | CHEWABLE_TABLET | ORAL | Status: DC | PRN
  Filled 2020-05-16: qty 2

## 2020-05-16 MED ORDER — BISMUTH SUBSALICYLATE 262 MG/15ML PO SUSP
30.0000 mL | ORAL | Status: DC | PRN
  Administered 2020-05-16: 30 mL via ORAL
  Filled 2020-05-16 (×2): qty 118

## 2020-05-16 MED ORDER — PRAZOSIN HCL 1 MG PO CAPS
1.0000 mg | ORAL_CAPSULE | Freq: Every day | ORAL | Status: DC
  Administered 2020-05-16 – 2020-05-17 (×2): 1 mg via ORAL
  Filled 2020-05-16 (×2): qty 1

## 2020-05-16 NOTE — Plan of Care (Signed)
°  Problem: Education: Goal: Knowledge of [facility] Education information/materials will improve Outcome: Progressing Goal: Emotional status will improve Outcome: Progressing Goal: Mental status will improve Outcome: Progressing Goal: Verbalization of understanding the information provided will improve Outcome: Progressing   Problem: Activity: Goal: Interest or engagement in activities will improve Outcome: Progressing Goal: Sleeping patterns will improve Outcome: Progressing   Problem: Coping: Goal: Ability to verbalize frustrations and anger appropriately will improve Outcome: Progressing Goal: Ability to demonstrate self-control will improve Outcome: Progressing   Problem: Health Behavior/Discharge Planning: Goal: Identification of resources available to assist in meeting health care needs will improve Outcome: Progressing Goal: Compliance with treatment plan for underlying cause of condition will improve Outcome: Progressing   Problem: Physical Regulation: Goal: Ability to maintain clinical measurements within normal limits will improve Outcome: Progressing   Problem: Safety: Goal: Periods of time without injury will increase Outcome: Progressing   Problem: Education: Goal: Knowledge of disease or condition will improve Outcome: Progressing Goal: Understanding of discharge needs will improve Outcome: Progressing   Problem: Health Behavior/Discharge Planning: Goal: Ability to identify changes in lifestyle to reduce recurrence of condition will improve Outcome: Progressing Goal: Identification of resources available to assist in meeting health care needs will improve Outcome: Progressing   Problem: Physical Regulation: Goal: Complications related to the disease process, condition or treatment will be avoided or minimized Outcome: Progressing   Problem: Safety: Goal: Ability to remain free from injury will improve Outcome: Progressing   Problem:  Education: Goal: Ability to make informed decisions regarding treatment will improve Outcome: Progressing   Problem: Coping: Goal: Coping ability will improve Outcome: Progressing   Problem: Health Behavior/Discharge Planning: Goal: Identification of resources available to assist in meeting health care needs will improve Outcome: Progressing   Problem: Medication: Goal: Compliance with prescribed medication regimen will improve Outcome: Progressing   Problem: Self-Concept: Goal: Ability to disclose and discuss suicidal ideas will improve Outcome: Progressing Goal: Will verbalize positive feelings about self Outcome: Progressing

## 2020-05-16 NOTE — Progress Notes (Addendum)
D- Patient alert and oriented. Pt affect/mood is worried and sad. Pt denies SI, HI, AVH, and pain. Pt received a PRN today for complaints of anxiety. Pt also received a PRN dor loose stools. They helped. He is also upset that his family is not returning his phone calls.   A- Scheduled medications administered to patient, per MD orders. Support and encouragement provided.  Routine safety checks conducted every 15 minutes.  Patient informed to notify staff with problems or concerns.  R- No adverse drug reactions noted. Patient contracts for safety at this time. Patient compliant with medications and treatment plan. Patient receptive, calm, and cooperative. Patient interacts well with others on the unit.  Patient remains safe at this time.  Collier Bullock RN

## 2020-05-16 NOTE — Progress Notes (Signed)
Patient has been pleasant and cooperative. Denies SI, HI and AVH. Complaining of abdominal pain and stomach upset. Received Ibuprofen and Maalox. No further complaints.

## 2020-05-16 NOTE — BHH Group Notes (Signed)
LCSW Group Therapy Note   05/16/2020 2:11 PM  Type of Therapy and Topic:  Group Therapy:  Overcoming Obstacles   Participation Level:  Did Not Attend   Description of Group:    In this group patients will be encouraged to explore what they see as obstacles to their own wellness and recovery. They will be guided to discuss their thoughts, feelings, and behaviors related to these obstacles. The group will process together ways to cope with barriers, with attention given to specific choices patients can make. Each patient will be challenged to identify changes they are motivated to make in order to overcome their obstacles. This group will be process-oriented, with patients participating in exploration of their own experiences as well as giving and receiving support and challenge from other group members.   Therapeutic Goals: 1. Patient will identify personal and current obstacles as they relate to admission. 2. Patient will identify barriers that currently interfere with their wellness or overcoming obstacles.  3. Patient will identify feelings, thought process and behaviors related to these barriers. 4. Patient will identify two changes they are willing to make to overcome these obstacles:      Summary of Patient Progress X     Therapeutic Modalities:   Cognitive Behavioral Therapy Solution Focused Therapy Motivational Interviewing Relapse Prevention Therapy  Ivan Hamilton, Ivan Hamilton, [place], [place] 05/16/2020 2:11 PM

## 2020-05-16 NOTE — Plan of Care (Signed)
Pt rates depression 8/10, hopelessness 7/10 and anxiety 9/10. Pt denies Si, HI and AVH. Pt was educated on care plan and verbalizes understanding. Collier Bullock RN Problem: Education: Goal: Knowledge of [facility] Education information/materials will improve Outcome: Progressing Goal: Emotional status will improve Outcome: Progressing Goal: Mental status will improve Outcome: Progressing Goal: Verbalization of understanding the information provided will improve Outcome: Progressing   Problem: Activity: Goal: Interest or engagement in activities will improve Outcome: Progressing Goal: Sleeping patterns will improve Outcome: Progressing   Problem: Coping: Goal: Ability to verbalize frustrations and anger appropriately will improve Outcome: Progressing Goal: Ability to demonstrate self-control will improve Outcome: Progressing   Problem: Health Behavior/Discharge Planning: Goal: Identification of resources available to assist in meeting health care needs will improve Outcome: Progressing Goal: Compliance with treatment plan for underlying cause of condition will improve Outcome: Progressing   Problem: Physical Regulation: Goal: Ability to maintain clinical measurements within normal limits will improve Outcome: Progressing   Problem: Safety: Goal: Periods of time without injury will increase Outcome: Progressing   Problem: Education: Goal: Knowledge of disease or condition will improve Outcome: Progressing Goal: Understanding of discharge needs will improve Outcome: Progressing   Problem: Health Behavior/Discharge Planning: Goal: Ability to identify changes in lifestyle to reduce recurrence of condition will improve Outcome: Progressing Goal: Identification of resources available to assist in meeting health care needs will improve Outcome: Progressing   Problem: Physical Regulation: Goal: Complications related to the disease process, condition or treatment will  be avoided or minimized Outcome: Progressing   Problem: Safety: Goal: Ability to remain free from injury will improve Outcome: Progressing   Problem: Education: Goal: Ability to make informed decisions regarding treatment will improve Outcome: Progressing   Problem: Coping: Goal: Coping ability will improve Outcome: Progressing   Problem: Health Behavior/Discharge Planning: Goal: Identification of resources available to assist in meeting health care needs will improve Outcome: Progressing   Problem: Medication: Goal: Compliance with prescribed medication regimen will improve Outcome: Progressing   Problem: Self-Concept: Goal: Ability to disclose and discuss suicidal ideas will improve Outcome: Progressing Goal: Will verbalize positive feelings about self Outcome: Progressing

## 2020-05-16 NOTE — BHH Counselor (Signed)
CSW contacted Shayla at [facility] regarding referral. Myrlene Broker confirmed receipt of referral and asked that patient call for a screening tomorrow morning (05/16/20) at 10:00. CSW agreed to pass this information on to patient.   CSW returned contact to Officer Konrad Penta (probation officer), [phone], to follow up regarding voicemail from Friday. CSW left voicemail stating that he was just confirming that patient was here for treatment.   Chalmers Guest. Guerry Bruin, MSW, [place], [place] 05/16/2020 3:19 PM

## 2020-05-16 NOTE — Progress Notes (Signed)
Recreation Therapy Notes   Date: 05/16/2020  Time: 9:30 am   Location: Craft room     Behavioral response: N/A   Intervention Topic: Relaxation   Discussion/Intervention: Patient did not attend group.   Clinical Observations/Feedback:  Patient did not attend group.   Gwendlyn Hanback LRT/CTRS        Panagiota Perfetti 05/16/2020 11:16 AM

## 2020-05-16 NOTE — Plan of Care (Signed)
  Problem: Education: Goal: Knowledge of [place] [facility] Education information/materials will improve Outcome: Progressing Goal: Emotional status will improve Outcome: Progressing Goal: Mental status will improve Outcome: Progressing Goal: Verbalization of understanding the information provided will improve Outcome: Progressing   Problem: Activity: Goal: Interest or engagement in activities will improve Outcome: Progressing Goal: Sleeping patterns will improve Outcome: Progressing   Problem: Coping: Goal: Ability to verbalize frustrations and anger appropriately will improve Outcome: Progressing Goal: Ability to demonstrate self-control will improve Outcome: Progressing   Problem: Health Behavior/Discharge Planning: Goal: Identification of resources available to assist in meeting health care needs will improve Outcome: Progressing Goal: Compliance with treatment plan for underlying cause of condition will improve Outcome: Progressing   Problem: Physical Regulation: Goal: Ability to maintain clinical measurements within normal limits will improve Outcome: Progressing   Problem: Safety: Goal: Periods of time without injury will increase Outcome: Progressing   Problem: Education: Goal: Knowledge of disease or condition will improve Outcome: Progressing Goal: Understanding of discharge needs will improve Outcome: Progressing   Problem: Health Behavior/Discharge Planning: Goal: Ability to identify changes in lifestyle to reduce recurrence of condition will improve Outcome: Progressing Goal: Identification of resources available to assist in meeting health care needs will improve Outcome: Progressing   Problem: Physical Regulation: Goal: Complications related to the disease process, condition or treatment will be avoided or minimized Outcome: Progressing   Problem: Safety: Goal: Ability to remain free from injury will improve Outcome: Progressing   Problem:  Education: Goal: Ability to make informed decisions regarding treatment will improve Outcome: Progressing   Problem: Coping: Goal: Coping ability will improve Outcome: Progressing   Problem: Health Behavior/Discharge Planning: Goal: Identification of resources available to assist in meeting health care needs will improve Outcome: Progressing   Problem: Medication: Goal: Compliance with prescribed medication regimen will improve Outcome: Progressing   Problem: Self-Concept: Goal: Ability to disclose and discuss suicidal ideas will improve Outcome: Progressing Goal: Will verbalize positive feelings about self Outcome: Progressing

## 2020-05-16 NOTE — Progress Notes (Signed)
[facility] MD Progress Note  05/16/2020 10:25 AM Ivan Hamilton  MRN:  [id number]   Subjective:  Patient seen one-on-one at bedside today. He notes that with increase in Seroquel he is no longer having auditory and visual hallucinations. However, he continues to have nightmares that wake him from sleep and cause profuse sweating overnight. During the day he also continues to have flashbacks, intrusive thoughts, and hypervigilance. Prozac increased over the weekend. Will add minipress 1 mg QHS for nightmares. Patient also continues to complain of right upper quadrant pain that is similar to pain he had prior to gall bladder removal. He also reports diarrhea this morning and nausea. Medicine consult placed yesterday. He continues to express frustration on lack of mental health coverage provided by his insurance. He remains open to inpatient and outpatient substance abuse treatment.   Principal Problem: Severe recurrent major depressive disorder with psychotic features ([place]) Diagnosis: Principal Problem:   Severe recurrent major depressive disorder with psychotic features ([place]) Active Problems:   PTSD (post-traumatic stress disorder)   Hypothyroid   Gastric reflux   Alcohol withdrawal (HCC)   Alcohol use disorder, severe, dependence ([place])   Suicidal ideation  Total Time spent with patient: 1 hour  Past Psychiatric History:  Past history of both mental health problems and substance abuse. Prior alcohol problems at times complicated by withdrawal  Past Medical History:  Past Medical History:  Diagnosis Date  . Alcohol withdrawal ([place])   . B12 deficiency   . Chronic pain   . CVA (cerebral vascular accident) ([place])   . Hypertension   . Hypothyroidism   . Varicose veins     Past Surgical History:  Procedure Laterality Date  . CHOLECYSTECTOMY    . ESOPHAGOGASTRODUODENOSCOPY (EGD) WITH PROPOFOL N/A 10/24/2017   Procedure: ESOPHAGOGASTRODUODENOSCOPY (EGD) WITH PROPOFOL;  Surgeon: [name], MD;   Location: [facility] ENDOSCOPY;  Service: Endoscopy;  Laterality: N/A;  . ESOPHAGOGASTRODUODENOSCOPY (EGD) WITH PROPOFOL N/A 07/12/2019   Procedure: ESOPHAGOGASTRODUODENOSCOPY (EGD) WITH PROPOFOL;  Surgeon: Jonathon Bellows, MD;  Location: [facility] ENDOSCOPY;  Service: Gastroenterology;  Laterality: N/A;  . LEG SURGERY     Family History: History reviewed. No pertinent family history. Family Psychiatric  History: Family history of alcohol use disorder Social History:  Social History   Substance and Sexual Activity  Alcohol Use Yes   Comment: daily- 1 pint of vodka     Social History   Substance and Sexual Activity  Drug Use No    Social History   Socioeconomic History  . Marital status: Married    Spouse name: Not on file  . Number of children: Not on file  . Years of education: Not on file  . Highest education level: Not on file  Occupational History  . Not on file  Tobacco Use  . Smoking status: Never Smoker  . Smokeless tobacco: Never Used  Vaping Use  . Vaping Use: Never used  Substance and Sexual Activity  . Alcohol use: Yes    Comment: daily- 1 pint of vodka  . Drug use: No  . Sexual activity: Not on file  Other Topics Concern  . Not on file  Social History Narrative  . Not on file   Social Determinants of Health   Financial Resource Strain:   . Difficulty of Paying Living Expenses: Not on file  Food Insecurity:   . Worried About Charity fundraiser in the Last Year: Not on file  . Ran Out of Food in the Last Year: Not on  file  Transportation Needs:   . Film/video editor (Medical): Not on file  . Lack of Transportation (Non-Medical): Not on file  Physical Activity:   . Days of Exercise per Week: Not on file  . Minutes of Exercise per Session: Not on file  Stress:   . Feeling of Stress : Not on file  Social Connections:   . Frequency of Communication with Friends and Family: Not on file  . Frequency of Social Gatherings with Friends and Family: Not on file  .  Attends Religious Services: Not on file  . Active Member of Clubs or Organizations: Not on file  . Attends Archivist Meetings: Not on file  . Marital Status: Not on file   Additional Social History:                         Sleep: Poor  Appetite:  Poor  Current Medications: Current Facility-Administered Medications  Medication Dose Route Frequency Provider Last Rate Last Admin  . acetaminophen (TYLENOL) tablet 650 mg  650 mg Oral Q6H PRN Clapacs, Madie Reno, MD   650 mg at 05/14/20 0651  . alum & mag hydroxide-simeth (MAALOX/MYLANTA) 200-200-20 MG/5ML suspension 30 mL  30 mL Oral Q4H PRN Clapacs, Madie Reno, MD   30 mL at 05/15/20 2239  . bismuth subsalicylate (PEPTO BISMOL) chewable tablet 524 mg  524 mg Oral Q4H PRN Salley Scarlet, MD      . FLUoxetine (PROZAC) capsule 80 mg  80 mg Oral Daily Rulon Sera, MD   80 mg at 05/15/20 1343  . gabapentin (NEURONTIN) tablet 600 mg  600 mg Oral TID Clapacs, John T, MD   600 mg at 05/16/20 0818  . hydrOXYzine (ATARAX/VISTARIL) tablet 25 mg  25 mg Oral Q8H PRN Clapacs, Madie Reno, MD   25 mg at 05/15/20 0818  . ibuprofen (ADVIL) tablet 600 mg  600 mg Oral Q8H PRN Clapacs, Madie Reno, MD   600 mg at 05/15/20 2239  . levothyroxine (SYNTHROID) tablet 125 mcg  125 mcg Oral Q0600 Clapacs, Madie Reno, MD   125 mcg at 05/16/20 [phone]  . magnesium hydroxide (MILK OF MAGNESIA) suspension 30 mL  30 mL Oral Daily PRN Clapacs, John T, MD      . prazosin (MINIPRESS) capsule 1 mg  1 mg Oral QHS Selina Cooley M, MD      . QUEtiapine (SEROQUEL) tablet 200 mg  200 mg Oral QHS Rulon Sera, MD   200 mg at 05/15/20 2127  . temazepam (RESTORIL) capsule 15 mg  15 mg Oral QHS Rulon Sera, MD   15 mg at 05/15/20 2127  . thiamine tablet 100 mg  100 mg Oral Daily Clapacs, John T, MD   100 mg at 05/16/20 0818   Or  . thiamine (B-1) injection 100 mg  100 mg Intravenous Daily Clapacs, John T, MD      . traZODone (DESYREL) tablet 100 mg  100 mg Oral QHS PRN Clapacs, Madie Reno, MD   100 mg at 05/14/20 2128    Lab Results:  Results for orders placed or performed during the hospital encounter of 05/11/20 (from the past 48 hour(s))  Hepatic function panel     Status: Abnormal   Collection Time: 05/14/20  1:47 PM  Result Value Ref Range   Total Protein 7.9 6.5 - 8.1 g/dL   Albumin 3.8 3.5 - 5.0 g/dL   AST 75 (H) 15 - 41 U/L  ALT 54 (H) 0 - 44 U/L   Alkaline Phosphatase 90 38 - 126 U/L   Total Bilirubin 0.6 0.3 - 1.2 mg/dL   Bilirubin, Direct <0.1 0.0 - 0.2 mg/dL   Indirect Bilirubin NOT CALCULATED 0.3 - 0.9 mg/dL    Comment: Performed at [facility], [address] [place], [place] [address]  Lipase, blood     Status: None   Collection Time: 05/15/20 10:15 AM  Result Value Ref Range   Lipase 45 11 - 51 U/L    Comment: Performed at [facility], [address] [place], [place] [address]  Amylase     Status: Abnormal   Collection Time: 05/15/20 10:15 AM  Result Value Ref Range   Amylase 121 (H) 28 - 100 U/L    Comment: Performed at [facility], [address] [place], [place] [address]  CBC with Differential/Platelet     Status: Abnormal   Collection Time: 05/15/20 10:15 AM  Result Value Ref Range   WBC 4.0 4.0 - 10.5 K/uL   RBC 4.01 (L) 4.22 - 5.81 MIL/uL   Hemoglobin 11.0 (L) 13.0 - 17.0 g/dL   HCT 33.1 (L) 39 - 52 %   MCV 82.5 80.0 - 100.0 fL   MCH 27.4 26.0 - 34.0 pg   MCHC 33.2 30.0 - 36.0 g/dL   RDW 20.3 (H) 11.5 - 15.5 %   Platelets 142 (L) 150 - 400 K/uL   nRBC 0.0 0.0 - 0.2 %   Neutrophils Relative % 54 %   Neutro Abs 2.2 1.7 - 7.7 K/uL   Lymphocytes Relative 32 %   Lymphs Abs 1.3 0.7 - 4.0 K/uL   Monocytes Relative 9 %   Monocytes Absolute 0.3 0.1 - 1.0 K/uL   Eosinophils Relative 3 %   Eosinophils Absolute 0.1 0.0 - 0.5 K/uL   Basophils Relative 1 %   Basophils Absolute 0.0 0.0 - 0.1 K/uL   Immature Granulocytes 1 %   Abs Immature Granulocytes 0.02 0.00 - 0.07 K/uL    Comment: Performed at  [facility], [address] [place], [place] [address]    Blood Alcohol level:  Lab Results  Component Value Date   ETH 379 (HH) 05/09/2020   ETH 471 (HH) 59/74/1638    Metabolic Disorder Labs: Lab Results  Component Value Date   HGBA1C 5.3 05/12/2020   MPG 105 05/12/2020   MPG 116.89 08/29/2018   No results found for: PROLACTIN Lab Results  Component Value Date   CHOL 207 (H) 05/12/2020   TRIG 72 05/12/2020   HDL 90 05/12/2020   CHOLHDL 2.3 05/12/2020   VLDL 14 05/12/2020   LDLCALC 103 (H) 05/12/2020   LDLCALC 96 08/29/2018    Physical Findings: AIMS: Facial and Oral Movements Muscles of Facial Expression: None, normal Lips and Perioral Area: None, normal Jaw: None, normal Tongue: None, normal,Extremity Movements Upper (arms, wrists, hands, fingers): None, normal Lower (legs, knees, ankles, toes): None, normal, Trunk Movements Neck, shoulders, hips: None, normal, Overall Severity Severity of abnormal movements (highest score from questions above): None, normal Incapacitation due to abnormal movements: None, normal Patient's awareness of abnormal movements (rate only patient's report): No Awareness, Dental Status Current problems with teeth and/or dentures?: No Does patient usually wear dentures?: No  CIWA:  CIWA-Ar Total: 2 COWS:     Musculoskeletal: Strength & Muscle Tone: within normal limits Gait & Station: normal Patient leans: N/A  Psychiatric Specialty Exam: Physical Exam Vitals and nursing note  reviewed.  Constitutional:      Appearance: Normal appearance.  HENT:     Head: Normocephalic and atraumatic.     Right Ear: External ear normal.     Left Ear: External ear normal.     Nose: Nose normal.     Mouth/Throat:     Mouth: Mucous membranes are moist.     Pharynx: Oropharynx is clear.  Eyes:     Extraocular Movements: Extraocular movements intact.     Conjunctiva/sclera: Conjunctivae normal.     Pupils: Pupils are equal, round, and  reactive to light.  Cardiovascular:     Rate and Rhythm: Normal rate.     Pulses: Normal pulses.  Pulmonary:     Effort: Pulmonary effort is normal.  Abdominal:     Tenderness: There is abdominal tenderness. There is right CVA tenderness.  Musculoskeletal:        General: No swelling. Normal range of motion.     Cervical back: Normal range of motion and neck supple.  Skin:    General: Skin is warm and dry.  Neurological:     General: No focal deficit present.     Mental Status: He is alert and oriented to person, place, and time.  Psychiatric:        Attention and Perception: Perception normal.        Mood and Affect: Mood is depressed. Affect is tearful.        Speech: Speech is tangential.        Behavior: Behavior is slowed and withdrawn.        Judgment: Judgment is impulsive.     Review of Systems  Constitutional: Negative for chills and diaphoresis.  HENT: Negative for rhinorrhea and sore throat.   Eyes: Negative for photophobia and visual disturbance.  Respiratory: Negative for cough and shortness of breath.   Cardiovascular: Negative for chest pain and palpitations.  Gastrointestinal: Positive for abdominal pain and diarrhea. Negative for nausea and vomiting.  Endocrine: Negative for cold intolerance and heat intolerance.  Genitourinary: Negative for difficulty urinating and dysuria.  Musculoskeletal: Negative for arthralgias and myalgias.  Skin: Negative for rash and wound.  Allergic/Immunologic: Negative for environmental allergies and food allergies.  Neurological: Negative for dizziness and light-headedness.  Hematological: Negative for adenopathy. Does not bruise/bleed easily.  Psychiatric/Behavioral: Positive for dysphoric mood and sleep disturbance. Negative for hallucinations and suicidal ideas.    Blood pressure 107/75, pulse 92, temperature 97.7 F (36.5 C), temperature source Oral, resp. rate 16, height 5\' 5"  (1.651 m), weight 63 kg, SpO2 97 %.Body mass  index is 23.13 kg/m.  General Appearance: Fairly Groomed  Eye Contact:  Good  Speech:  Normal Rate  Volume:  Normal  Mood:  Dysphoric  Affect:  Congruent  Thought Process:  Coherent  Orientation:  Full (Time, Place, and Person)  Thought Content:  Rumination and Tangential  Suicidal Thoughts:  No  Homicidal Thoughts:  No  Memory:  Immediate;   Fair Recent;   Fair  Judgement:  Fair  Insight:  Fair  Psychomotor Activity:  Normal  Concentration:  Concentration: Fair and Attention Span: Fair  Recall:  [employer] of Knowledge:  Fair  Language:  Fair  Akathisia:  Negative  Handed:  Right  AIMS (if indicated):     Assets:  Communication Skills Desire for Improvement Financial Resources/Insurance Housing Social Support  ADL's:  Intact  Cognition:  WNL  Sleep:  Number of Hours: 6.25     Treatment Plan Summary:  Daily contact with patient to assess and evaluate symptoms and progress in treatment, Medication management and Plan add minipress 1 mg QHS for nightmares. Peptobismol added for symptomatic relief of diarrhea. Medicine consult for RUQ pain. CMP completed, mildly elevated AST, ALT. Mildly elevated lipase. Repeat CBC without white count, afebrile. Ultrasound completed, interpretation via Dr. Nyoka Cowden as follows, "IMPRESSION: Status post cholecystectomy. No acute abnormality. Coarsened and heterogeneous appearance of the liver parenchyma which can be seen in patients with underlying hepatocellular disease."  Salley Scarlet, MD 05/16/2020, 10:25 AM

## 2020-05-17 DIAGNOSIS — K219 Gastro-esophageal reflux disease without esophagitis: Secondary | ICD-10-CM

## 2020-05-17 DIAGNOSIS — R197 Diarrhea, unspecified: Secondary | ICD-10-CM | POA: Diagnosis not present

## 2020-05-17 DIAGNOSIS — I639 Cerebral infarction, unspecified: Secondary | ICD-10-CM

## 2020-05-17 DIAGNOSIS — R1011 Right upper quadrant pain: Secondary | ICD-10-CM | POA: Diagnosis present

## 2020-05-17 DIAGNOSIS — F333 Major depressive disorder, recurrent, severe with psychotic symptoms: Secondary | ICD-10-CM | POA: Diagnosis not present

## 2020-05-17 DIAGNOSIS — K7030 Alcoholic cirrhosis of liver without ascites: Secondary | ICD-10-CM

## 2020-05-17 DIAGNOSIS — F1010 Alcohol abuse, uncomplicated: Secondary | ICD-10-CM

## 2020-05-17 DIAGNOSIS — F4310 Post-traumatic stress disorder, unspecified: Secondary | ICD-10-CM

## 2020-05-17 DIAGNOSIS — K2980 Duodenitis without bleeding: Secondary | ICD-10-CM

## 2020-05-17 DIAGNOSIS — I10 Essential (primary) hypertension: Secondary | ICD-10-CM

## 2020-05-17 LAB — CBC
HCT: 33.8 % — ABNORMAL LOW (ref 39.0–52.0)
Hemoglobin: 11.2 g/dL — ABNORMAL LOW (ref 13.0–17.0)
MCH: 27.5 pg (ref 26.0–34.0)
MCHC: 33.1 g/dL (ref 30.0–36.0)
MCV: 83 fL (ref 80.0–100.0)
Platelets: 143 10*3/uL — ABNORMAL LOW (ref 150–400)
RBC: 4.07 MIL/uL — ABNORMAL LOW (ref 4.22–5.81)
RDW: 20.3 % — ABNORMAL HIGH (ref 11.5–15.5)
WBC: 4.1 10*3/uL (ref 4.0–10.5)
nRBC: 0 % (ref 0.0–0.2)

## 2020-05-17 LAB — PROTIME-INR
INR: 1.1 (ref 0.8–1.2)
Prothrombin Time: 13.9 seconds (ref 11.4–15.2)

## 2020-05-17 LAB — HIV ANTIBODY (ROUTINE TESTING W REFLEX): HIV Screen 4th Generation wRfx: NONREACTIVE

## 2020-05-17 LAB — APTT: aPTT: 33 seconds (ref 24–36)

## 2020-05-17 LAB — HEPATITIS PANEL, ACUTE
HCV Ab: NONREACTIVE
Hep A IgM: NONREACTIVE
Hep B C IgM: NONREACTIVE
Hepatitis B Surface Ag: NONREACTIVE

## 2020-05-17 LAB — AMMONIA: Ammonia: 48 umol/L — ABNORMAL HIGH (ref 9–35)

## 2020-05-17 MED ORDER — LACTULOSE 10 GM/15ML PO SOLN
10.0000 g | Freq: Every day | ORAL | Status: DC
  Administered 2020-05-18: 10 g via ORAL
  Filled 2020-05-17: qty 30

## 2020-05-17 MED ORDER — HYDRALAZINE HCL 25 MG PO TABS
25.0000 mg | ORAL_TABLET | Freq: Three times a day (TID) | ORAL | Status: DC | PRN
  Filled 2020-05-17: qty 1

## 2020-05-17 MED ORDER — FOLIC ACID 1 MG PO TABS
1.0000 mg | ORAL_TABLET | Freq: Every day | ORAL | Status: DC
  Administered 2020-05-17 – 2020-05-18 (×2): 1 mg via ORAL
  Filled 2020-05-17 (×2): qty 1

## 2020-05-17 MED ORDER — ONDANSETRON HCL 4 MG PO TABS: 4.0000 mg | ORAL_TABLET | Freq: Four times a day (QID) | ORAL | Status: DC | PRN

## 2020-05-17 MED ORDER — PANTOPRAZOLE SODIUM 40 MG PO TBEC
40.0000 mg | DELAYED_RELEASE_TABLET | Freq: Every day | ORAL | Status: DC
  Administered 2020-05-17 – 2020-05-18 (×2): 40 mg via ORAL
  Filled 2020-05-17 (×2): qty 1

## 2020-05-17 NOTE — BHH Counselor (Signed)
Pt was given information to follow up regarding screening with [facility] this morning at 10:00. Pt completed screening and was informed that he did not meet criteria for inpatient as he had drank once in the past month.   CSW followed up with pt regarding the screening. He stated that he was ready to go home. He shared that he does not feel that he should have been turned down for [facility] since they went through all of these questions in the screening. CSW explained that he had no control over that but that he did not meet their criteria. CSW gave patient listing of psychiatrist and therapist who take his insurance for review. Pt was encouraged to look it over and let CSW know which person he would be interested in seeing for outpatient services. He agreed. No other concerns expressed. Contact ended without incident.   Ivan Hamilton. Guerry Bruin, MSW, [place], [place] 05/17/2020 4:15 PM

## 2020-05-17 NOTE — BHH Group Notes (Signed)
LCSW Group Therapy Note  05/17/2020 2:10 PM  Type of Therapy/Topic:  Group Therapy:  Feelings about Diagnosis  Participation Level:  Active   Description of Group:   This group will allow patients to explore their thoughts and feelings about diagnoses they have received. Patients will be guided to explore their level of understanding and acceptance of these diagnoses. Facilitator will encourage patients to process their thoughts and feelings about the reactions of others to their diagnosis and will guide patients in identifying ways to discuss their diagnosis with significant others in their lives. This group will be process-oriented, with patients participating in exploration of their own experiences, giving and receiving support, and processing challenge from other group members.   Therapeutic Goals: 1. Patient will demonstrate understanding of diagnosis as evidenced by identifying two or more symptoms of the disorder 2. Patient will be able to express two feelings regarding the diagnosis 3. Patient will demonstrate their ability to communicate their needs through discussion and/or role play  Summary of Patient Progress: Patient was present for group. Patient shared a recent negative event between he and another patient. Patient asked for clarification on what cognitive distortion could have effected the interaction.  Patient was receptive and respectful of others.     Therapeutic Modalities:   Cognitive Behavioral Therapy Brief Therapy Feelings Identification   Assunta Curtis, MSW, LCSW 05/17/2020 2:10 PM

## 2020-05-17 NOTE — Progress Notes (Signed)
Pt does not have a stool ready for sample still. Pt educated  Collier Bullock RN

## 2020-05-17 NOTE — Plan of Care (Signed)
Pt rates depression, anxiety and hopelessness all at 7/10. Pt denies SI, HI and AVH. Pt was educated on care plan and verbalizes understanding. Pt was encouraged to attend groups. Collier Bullock RN Problem: Education: Goal: Knowledge of [place] [facility] Education information/materials will improve Outcome: Progressing Goal: Emotional status will improve Outcome: Progressing Goal: Mental status will improve Outcome: Progressing Goal: Verbalization of understanding the information provided will improve Outcome: Progressing   Problem: Activity: Goal: Interest or engagement in activities will improve Outcome: Progressing Goal: Sleeping patterns will improve Outcome: Progressing   Problem: Coping: Goal: Ability to verbalize frustrations and anger appropriately will improve Outcome: Progressing Goal: Ability to demonstrate self-control will improve Outcome: Progressing   Problem: Health Behavior/Discharge Planning: Goal: Identification of resources available to assist in meeting health care needs will improve Outcome: Progressing Goal: Compliance with treatment plan for underlying cause of condition will improve Outcome: Progressing   Problem: Physical Regulation: Goal: Ability to maintain clinical measurements within normal limits will improve Outcome: Progressing   Problem: Safety: Goal: Periods of time without injury will increase Outcome: Progressing   Problem: Education: Goal: Knowledge of disease or condition will improve Outcome: Progressing Goal: Understanding of discharge needs will improve Outcome: Progressing   Problem: Health Behavior/Discharge Planning: Goal: Ability to identify changes in lifestyle to reduce recurrence of condition will improve Outcome: Progressing Goal: Identification of resources available to assist in meeting health care needs will improve Outcome: Progressing   Problem: Physical Regulation: Goal: Complications related to the disease  process, condition or treatment will be avoided or minimized Outcome: Progressing   Problem: Safety: Goal: Ability to remain free from injury will improve Outcome: Progressing   Problem: Education: Goal: Ability to make informed decisions regarding treatment will improve Outcome: Progressing   Problem: Coping: Goal: Coping ability will improve Outcome: Progressing   Problem: Health Behavior/Discharge Planning: Goal: Identification of resources available to assist in meeting health care needs will improve Outcome: Progressing   Problem: Medication: Goal: Compliance with prescribed medication regimen will improve Outcome: Progressing   Problem: Self-Concept: Goal: Ability to disclose and discuss suicidal ideas will improve Outcome: Progressing Goal: Will verbalize positive feelings about self Outcome: Progressing

## 2020-05-17 NOTE — Progress Notes (Signed)
Patient calm and cooperative during assessment, denying SI/HI/AVH. Patient endorses anxiety rating it 6/10. Patient given education, support, and encouragement to be active in his treatment plan. Patient compliant with medication administration per MD orders. Patient observed by this Probation officer interacting appropriate with staff and peers on the unit. Patient being monitored Q 15 minutes for safety per unit protocol. Patient remains safe on the unit.

## 2020-05-17 NOTE — Progress Notes (Signed)
Patient is pleasant and easy to engage.  He denies SI  HI AVH depression and anxiety at this encounter.  He has complaint of stomach issues and was provided medication to help with symptoms. He also received his other prescribed meds and tolerated without incident.  He was encouraged to contact staff with any concerns and remains safe on the unit with 15 minute safety checks.       Cleo Buter-Nicholson, LPN

## 2020-05-17 NOTE — Progress Notes (Signed)
Recreation Therapy Notes  Date: 05/17/2020  Time: 9:30 am   Location: Craft room     Behavioral response: N/A   Intervention Topic: Stress Management   Discussion/Intervention: Patient did not attend group.   Clinical Observations/Feedback:  Patient did not attend group.   Daisa Stennis LRT/CTRS        Netty Sullivant 05/17/2020 11:15 AM

## 2020-05-17 NOTE — Progress Notes (Signed)
[facility] MD Progress Note  05/17/2020 11:53 AM Ivan Hamilton  MRN:  [id number]   Subjective:  Patient seen one-on-one at bedside today. He notes that with increase in Seroquel he is no longer having auditory and visual hallucinations. He also notes nightmares improved with Prazosin. He feels his overall depression, anxiety, and PTSD symptoms have improved with changes to medication. He feels he is ready to be discharged home tomorrow with outpatient follow-up.  Patient also continues to complain of right upper quadrant pain that is similar to pain he had prior to gall bladder removal. Hospitalist saw this morning, and appreciate their assistance. DC Tylenol, start Protonix for GERD, and labs per hospitalist. Continue to monitor.  Principal Problem: Severe recurrent major depressive disorder with psychotic features ([place]) Diagnosis: Principal Problem:   Severe recurrent major depressive disorder with psychotic features ([place]) Active Problems:   PTSD (post-traumatic stress disorder)   Hypothyroid   Gastric reflux   Duodenitis   Suicidal ideation   Alcohol abuse   CVA (cerebral vascular accident) ([place])   HTN (hypertension)   Transaminitis   Hepatic cirrhosis ([place])   Diarrhea   RUQ abdominal pain  Total Time spent with patient: 30 minutes  Past Psychiatric History:  Past history of both mental health problems and substance abuse. Prior alcohol problems at times complicated by withdrawal  Past Medical History:  Past Medical History:  Diagnosis Date  . Alcohol withdrawal ([place])   . B12 deficiency   . Chronic pain   . CVA (cerebral vascular accident) ([place])   . Hypertension   . Hypothyroidism   . Varicose veins     Past Surgical History:  Procedure Laterality Date  . CHOLECYSTECTOMY    . ESOPHAGOGASTRODUODENOSCOPY (EGD) WITH PROPOFOL N/A 10/24/2017   Procedure: ESOPHAGOGASTRODUODENOSCOPY (EGD) WITH PROPOFOL;  Surgeon: [name], MD;  Location: [facility] ENDOSCOPY;  Service: Endoscopy;   Laterality: N/A;  . ESOPHAGOGASTRODUODENOSCOPY (EGD) WITH PROPOFOL N/A 07/12/2019   Procedure: ESOPHAGOGASTRODUODENOSCOPY (EGD) WITH PROPOFOL;  Surgeon: Jonathon Bellows, MD;  Location: [facility] ENDOSCOPY;  Service: Gastroenterology;  Laterality: N/A;  . LEG SURGERY     Family History: History reviewed. No pertinent family history. Family Psychiatric  History: Family history of alcohol use disorder Social History:  Social History   Substance and Sexual Activity  Alcohol Use Yes   Comment: daily- 1 pint of vodka     Social History   Substance and Sexual Activity  Drug Use No    Social History   Socioeconomic History  . Marital status: Married    Spouse name: Not on file  . Number of children: Not on file  . Years of education: Not on file  . Highest education level: Not on file  Occupational History  . Not on file  Tobacco Use  . Smoking status: Never Smoker  . Smokeless tobacco: Never Used  Vaping Use  . Vaping Use: Never used  Substance and Sexual Activity  . Alcohol use: Yes    Comment: daily- 1 pint of vodka  . Drug use: No  . Sexual activity: Not on file  Other Topics Concern  . Not on file  Social History Narrative  . Not on file   Social Determinants of Health   Financial Resource Strain:   . Difficulty of Paying Living Expenses: Not on file  Food Insecurity:   . Worried About Charity fundraiser in the Last Year: Not on file  . Ran Out of Food in the Last Year: Not on file  Transportation Needs:   . Film/video editor (Medical): Not on file  . Lack of Transportation (Non-Medical): Not on file  Physical Activity:   . Days of Exercise per Week: Not on file  . Minutes of Exercise per Session: Not on file  Stress:   . Feeling of Stress : Not on file  Social Connections:   . Frequency of Communication with Friends and Family: Not on file  . Frequency of Social Gatherings with Friends and Family: Not on file  . Attends Religious Services: Not on file  .  Active Member of Clubs or Organizations: Not on file  . Attends Archivist Meetings: Not on file  . Marital Status: Not on file   Additional Social History:                         Sleep: Fair  Appetite:  Fair  Current Medications: Current Facility-Administered Medications  Medication Dose Route Frequency Provider Last Rate Last Admin  . alum & mag hydroxide-simeth (MAALOX/MYLANTA) 200-200-20 MG/5ML suspension 30 mL  30 mL Oral Q4H PRN Clapacs, Madie Reno, MD   30 mL at 05/16/20 2115  . bismuth subsalicylate (PEPTO BISMOL) 262 MG/15ML suspension 30 mL  30 mL Oral Q4H PRN Salley Scarlet, MD   30 mL at 05/16/20 1645  . FLUoxetine (PROZAC) capsule 80 mg  80 mg Oral Daily Rulon Sera, MD   80 mg at 05/16/20 1213  . folic acid (FOLVITE) tablet 1 mg  1 mg Oral Daily Ivor Costa, MD      . gabapentin (NEURONTIN) tablet 600 mg  600 mg Oral TID Clapacs, John T, MD   600 mg at 05/17/20 5462  . hydrALAZINE (APRESOLINE) tablet 25 mg  25 mg Oral Q8H PRN Ivor Costa, MD      . hydrOXYzine (ATARAX/VISTARIL) tablet 25 mg  25 mg Oral Q8H PRN Clapacs, Madie Reno, MD   25 mg at 05/17/20 7035  . ibuprofen (ADVIL) tablet 600 mg  600 mg Oral Q8H PRN Clapacs, Madie Reno, MD   600 mg at 05/15/20 2239  . levothyroxine (SYNTHROID) tablet 125 mcg  125 mcg Oral Q0600 Clapacs, Madie Reno, MD   125 mcg at 05/17/20 [phone]  . magnesium hydroxide (MILK OF MAGNESIA) suspension 30 mL  30 mL Oral Daily PRN Clapacs, John T, MD      . ondansetron (ZOFRAN) tablet 4 mg  4 mg Oral Q6H PRN Ivor Costa, MD      . pantoprazole (PROTONIX) EC tablet 40 mg  40 mg Oral Q1200 Ivor Costa, MD      . prazosin (MINIPRESS) capsule 1 mg  1 mg Oral QHS Salley Scarlet, MD   1 mg at 05/16/20 2109  . QUEtiapine (SEROQUEL) tablet 200 mg  200 mg Oral QHS Rulon Sera, MD   200 mg at 05/16/20 2108  . temazepam (RESTORIL) capsule 15 mg  15 mg Oral QHS Rulon Sera, MD   15 mg at 05/16/20 2108  . thiamine tablet 100 mg  100 mg Oral Daily Clapacs,  John T, MD   100 mg at 05/17/20 8182   Or  . thiamine (B-1) injection 100 mg  100 mg Intravenous Daily Clapacs, John T, MD      . traZODone (DESYREL) tablet 100 mg  100 mg Oral QHS PRN Clapacs, Madie Reno, MD   100 mg at 05/16/20 2108    Lab Results:  No results found for this or  any previous visit (from the past 48 hour(s)).  Blood Alcohol level:  Lab Results  Component Value Date   ETH 379 (HH) 05/09/2020   ETH 471 (HH) 69/48/5462    Metabolic Disorder Labs: Lab Results  Component Value Date   HGBA1C 5.3 05/12/2020   MPG 105 05/12/2020   MPG 116.89 08/29/2018   No results found for: PROLACTIN Lab Results  Component Value Date   CHOL 207 (H) 05/12/2020   TRIG 72 05/12/2020   HDL 90 05/12/2020   CHOLHDL 2.3 05/12/2020   VLDL 14 05/12/2020   LDLCALC 103 (H) 05/12/2020   LDLCALC 96 08/29/2018    Physical Findings: AIMS: Facial and Oral Movements Muscles of Facial Expression: None, normal Lips and Perioral Area: None, normal Jaw: None, normal Tongue: None, normal,Extremity Movements Upper (arms, wrists, hands, fingers): None, normal Lower (legs, knees, ankles, toes): None, normal, Trunk Movements Neck, shoulders, hips: None, normal, Overall Severity Severity of abnormal movements (highest score from questions above): None, normal Incapacitation due to abnormal movements: None, normal Patient's awareness of abnormal movements (rate only patient's report): No Awareness, Dental Status Current problems with teeth and/or dentures?: No Does patient usually wear dentures?: No  CIWA:  CIWA-Ar Total: 2 COWS:     Musculoskeletal: Strength & Muscle Tone: within normal limits Gait & Station: normal Patient leans: N/A  Psychiatric Specialty Exam: Physical Exam Vitals and nursing note reviewed.  Constitutional:      Appearance: Normal appearance.  HENT:     Head: Normocephalic and atraumatic.     Right Ear: External ear normal.     Left Ear: External ear normal.     Nose:  Nose normal.     Mouth/Throat:     Mouth: Mucous membranes are moist.     Pharynx: Oropharynx is clear.  Eyes:     Extraocular Movements: Extraocular movements intact.     Conjunctiva/sclera: Conjunctivae normal.     Pupils: Pupils are equal, round, and reactive to light.  Cardiovascular:     Rate and Rhythm: Normal rate.     Pulses: Normal pulses.  Pulmonary:     Effort: Pulmonary effort is normal.  Abdominal:     Tenderness: There is abdominal tenderness. There is right CVA tenderness.  Musculoskeletal:        General: No swelling. Normal range of motion.     Cervical back: Normal range of motion and neck supple.  Skin:    General: Skin is warm and dry.  Neurological:     General: No focal deficit present.     Mental Status: He is alert and oriented to person, place, and time.  Psychiatric:        Attention and Perception: Perception normal.        Mood and Affect: Mood is depressed. Affect is tearful.        Speech: Speech is tangential.        Behavior: Behavior is slowed and withdrawn.        Judgment: Judgment is impulsive.     Review of Systems  Constitutional: Negative for chills and diaphoresis.  HENT: Negative for rhinorrhea and sore throat.   Eyes: Negative for photophobia and visual disturbance.  Respiratory: Negative for cough and shortness of breath.   Cardiovascular: Negative for chest pain and palpitations.  Gastrointestinal: Positive for abdominal pain and diarrhea. Negative for nausea and vomiting.  Endocrine: Negative for cold intolerance and heat intolerance.  Genitourinary: Negative for difficulty urinating and dysuria.  Musculoskeletal: Negative for  arthralgias and myalgias.  Skin: Negative for rash and wound.  Allergic/Immunologic: Negative for environmental allergies and food allergies.  Neurological: Negative for dizziness and light-headedness.  Hematological: Negative for adenopathy. Does not bruise/bleed easily.  Psychiatric/Behavioral: Negative  for dysphoric mood, hallucinations, sleep disturbance and suicidal ideas.    Blood pressure 115/77, pulse 73, temperature 98 F (36.7 C), temperature source Oral, resp. rate 18, height 5\' 5"  (1.651 m), weight 63 kg, SpO2 99 %.Body mass index is 23.13 kg/m.  General Appearance: Fairly Groomed  Eye Contact:  Good  Speech:  Normal Rate  Volume:  Normal  Mood:  Dysphoric  Affect:  Congruent  Thought Process:  Coherent  Orientation:  Full (Time, Place, and Person)  Thought Content:  Rumination and Tangential  Suicidal Thoughts:  No  Homicidal Thoughts:  No  Memory:  Immediate;   Fair Recent;   Fair  Judgement:  Fair  Insight:  Fair  Psychomotor Activity:  Normal  Concentration:  Concentration: Fair and Attention Span: Fair  Recall:  [employer] of Knowledge:  Fair  Language:  Fair  Akathisia:  Negative  Handed:  Right  AIMS (if indicated):     Assets:  Communication Skills Desire for Improvement Financial Resources/Insurance Housing Social Support  ADL's:  Intact  Cognition:  WNL  Sleep:  Number of Hours: 7.15     Treatment Plan Summary: Daily contact with patient to assess and evaluate symptoms and progress in treatment, Medication management and Plan continue medications as above. Medicine consult for RUQ pain complete, appreciate assistance. Ammonia, APTT, CBC, CMP, Hepatitis panel, HIV, and INR labs. Start protonix for GERD, avoid tylenol. Tentative discharge tomorrow pending medical work-up.   Salley Scarlet, MD 05/17/2020, 11:53 AM

## 2020-05-17 NOTE — Consult Note (Addendum)
Medical Consultation   Ivan Hamilton  UQJ:[id number]  DOB: [birth year]  DOA: 05/11/2020  PCP: Tracie Harrier, MD   Outpatient Specialists:   Requesting physician: -Dr. Domingo Cocking of [facility]  Reason for consultation: -diarrhea and RUQ AP   History of Present Illness: Ivan Hamilton is an 54 y.o. male with a past medical history of PTSD, depression, hypertension, stroke, GERD, hypothyroidism, varicose vein, alcohol abuse, vitamin B12 deficiency, chronic pain, duodenitis, diverticulitis, liver cirrhosis, who is admitted to behavioral health due to severe recurrent depression and PTSD with suicidal ideation on 10/21.  We are asked to consult due to diarrhea and right upper quadrant abdominal pain.  Patient states that he has history of cholecystitis .After the surgery, he has been having intermittent right upper quadrant abdominal pain.  Patient reports abdominal pain in the right upper quadrant in the past several days, which is mild to moderate, aching, nonradiating.  He has nausea, but no vomiting.  No fever or chills.  Patient has mild diarrhea.  He has had 2 episodes of diarrhea since yesterday.  Today he did not have diarrhea so far. Patient denies any chest pain, shortness breath, cough.  No symptoms of UTI or unilateral weakness.  Patient denies suicidal homicidal ideations.  Liver function on 05/14/2020 showed ALP 90, AST 75, ALT 54, total bilirubin <0.1.  Right upper quadrant ultrasound on 05/15/2020 is negative for acute findings.  Currently blood pressure 115/77, heart rate 73, RR 18, oxygen saturation 99% on room air, temperature normal.  Patient had negative Covid PCR 05/09/2020.   Review of Systems:   General: no fevers, chills, no changes in body weight, no changes in appetite Skin: no rash HEENT: no blurry vision, hearing changes or sore throat Pulm: no dyspnea, coughing, wheezing CV: no chest pain, palpitations, shortness of breath Abd: has nausea, RUQ  abdominal pain, diarrhea, no constipation GU: no dysuria, hematuria, polyuria Ext: no arthralgias, myalgias Neuro: no weakness, numbness, or tingling Psych: No suicidal or homicidal ideations currently.    Past Medical History: Past Medical History:  Diagnosis Date  . Alcohol withdrawal ([place])   . B12 deficiency   . Chronic pain   . CVA (cerebral vascular accident) ([place])   . Hypertension   . Hypothyroidism   . Varicose veins     Past Surgical History: Past Surgical History:  Procedure Laterality Date  . CHOLECYSTECTOMY    . ESOPHAGOGASTRODUODENOSCOPY (EGD) WITH PROPOFOL N/A 10/24/2017   Procedure: ESOPHAGOGASTRODUODENOSCOPY (EGD) WITH PROPOFOL;  Surgeon: [name], MD;  [facility] ENDOSCOPY;  Service: Endoscopy;  Laterality: N/A;  . ESOPHAGOGASTRODUODENOSCOPY (EGD) WITH PROPOFOL N/A 07/12/2019   Procedure: ESOPHAGOGASTRODUODENOSCOPY (EGD) WITH PROPOFOL;  Surgeon: Jonathon Bellows, MD;  Location[facility] ENDOSCOPY;  Service: Gastroenterology;  Laterality: N/A;  . LEG SURGERY       Allergies:  No Known Allergies   Social History:  reports that he has never smoked. He has never used smokeless tobacco. He reports current alcohol use. He reports that he does not use drugs.   Family History: I have reviewed with patient, but patient states that all family members are healthy without significant medical history.   Physical Exam: Vitals:   05/15/20 0647 05/16/20 0651 05/16/20 2113 05/17/20 0627  BP: 108/79 107/75 114/77 115/77  Pulse: 73 92 80 73  Resp: 16  16 18   Temp: 97.9 F (36.6 C) 97.7 F (36.5 C)  98 F (36.7 C)  TempSrc:  Oral  Oral  SpO2: 98% 97%  99%  Weight:      Height:        General: Not in acute distress HEENT: PERRL, EOMI, no scleral icterus, No JVD or bruit Cardiac: S1/S2, RRR, No murmurs, gallops or rubs Pulm: Clear to auscultation bilaterally. No rales, wheezing, rhonchi or rubs. Abd: Soft, nondistended, has mild tenderness in RUQ, no rebound  pain, no organomegaly, BS present Ext: No edema. 1+DP/PT pulse bilaterally Musculoskeletal: No joint deformities, erythema, or stiffness, ROM full Skin: No rashes.  Neuro: Alert and oriented X3, cranial nerves II-XII grossly intact, muscle strength 5/5 in all extremeties. Psych: Patient is calm now, no suicidal or hemocidal ideation.   Data reviewed:  I have personally reviewed following labs and imaging studies Labs:  CBC: Recent Labs  Lab 05/15/20 1015  WBC 4.0  NEUTROABS 2.2  HGB 11.0*  HCT 33.1*  MCV 82.5  PLT 142*    Basic Metabolic Panel: No results for input(s): NA, K, CL, CO2, GLUCOSE, BUN, CREATININE, CALCIUM, MG, PHOS in the last 168 hours. GFR Estimated Creatinine Clearance: 88.5 mL/min (by C-G formula based on SCr of 0.82 mg/dL). Liver Function Tests: Recent Labs  Lab 05/14/20 1347  AST 75*  ALT 54*  ALKPHOS 90  BILITOT 0.6  PROT 7.9  ALBUMIN 3.8   Recent Labs  Lab 05/15/20 1015  LIPASE 45  AMYLASE 121*   No results for input(s): AMMONIA in the last 168 hours. Coagulation profile No results for input(s): INR, PROTIME in the last 168 hours.  Cardiac Enzymes: No results for input(s): CKTOTAL, CKMB, CKMBINDEX, TROPONINI in the last 168 hours. BNP: Invalid input(s): POCBNP CBG: No results for input(s): GLUCAP in the last 168 hours. D-Dimer No results for input(s): DDIMER in the last 72 hours. Hgb A1c No results for input(s): HGBA1C in the last 72 hours. Lipid Profile No results for input(s): CHOL, HDL, LDLCALC, TRIG, CHOLHDL, LDLDIRECT in the last 72 hours. Thyroid function studies No results for input(s): TSH, T4TOTAL, T3FREE, THYROIDAB in the last 72 hours.  Invalid input(s): FREET3 Anemia work up No results for input(s): VITAMINB12, FOLATE, FERRITIN, TIBC, IRON, RETICCTPCT in the last 72 hours. Urinalysis    Component Value Date/Time   COLORURINE AMBER (A) 07/11/2019 0352   APPEARANCEUR CLEAR (A) 07/11/2019 0352   APPEARANCEUR Cloudy  08/11/2014 1030   LABSPEC 1.015 07/11/2019 0352   LABSPEC 1.027 08/11/2014 1030   PHURINE 7.0 07/11/2019 0352   GLUCOSEU NEGATIVE 07/11/2019 0352   GLUCOSEU >=500 08/11/2014 1030   HGBUR NEGATIVE 07/11/2019 0352   BILIRUBINUR SMALL (A) 07/11/2019 0352   BILIRUBINUR 1+ 08/11/2014 1030   KETONESUR NEGATIVE 07/11/2019 0352   PROTEINUR 30 (A) 07/11/2019 0352   NITRITE NEGATIVE 07/11/2019 0352   LEUKOCYTESUR NEGATIVE 07/11/2019 0352   LEUKOCYTESUR Negative 08/11/2014 1030     Microbiology Recent Results (from the past 240 hour(s))  Respiratory Panel by RT PCR (Flu A&B, Covid) - Nasopharyngeal Swab     Status: None   Collection Time: 05/09/20  9:17 PM   Specimen: Nasopharyngeal Swab  Result Value Ref Range Status   SARS Coronavirus 2 by RT PCR NEGATIVE NEGATIVE Final    Comment: (NOTE) SARS-CoV-2 target nucleic acids are NOT DETECTED.  The SARS-CoV-2 RNA is generally detectable in upper respiratoy specimens during the acute phase of infection. The lowest concentration of SARS-CoV-2 viral copies this assay can detect is 131 copies/mL. A negative result does not preclude SARS-Cov-2 infection and should not be used as  the sole basis for treatment or other patient management decisions. A negative result may occur with  improper specimen collection/handling, submission of specimen other than nasopharyngeal swab, presence of viral mutation(s) within the areas targeted by this assay, and inadequate number of viral copies (<131 copies/mL). A negative result must be combined with clinical observations, patient history, and epidemiological information. The expected result is Negative.  Fact Sheet for Patients:  PinkCheek.be  Fact Sheet for Healthcare Providers:  GravelBags.it  This test is no t yet approved or cleared by the [place] FDA and  has been authorized for detection and/or diagnosis of SARS-CoV-2 by FDA under an  Emergency Use Authorization (EUA). This EUA will remain  in effect (meaning this test can be used) for the duration of the COVID-19 declaration under Section 564(b)(1) of the Act, 21 U.S.C. section 360bbb-3(b)(1), unless the authorization is terminated or revoked sooner.     Influenza A by PCR NEGATIVE NEGATIVE Final   Influenza B by PCR NEGATIVE NEGATIVE Final    Comment: (NOTE) The Xpert Xpress SARS-CoV-2/FLU/RSV assay is intended as an aid in  the diagnosis of influenza from Nasopharyngeal swab specimens and  should not be used as a sole basis for treatment. Nasal washings and  aspirates are unacceptable for Xpert Xpress SARS-CoV-2/FLU/RSV  testing.  Fact Sheet for Patients: PinkCheek.be  Fact Sheet for Healthcare Providers: GravelBags.it  This test is not yet approved or cleared by the [place] FDA and  has been authorized for detection and/or diagnosis of SARS-CoV-2 by  FDA under an Emergency Use Authorization (EUA). This EUA will remain  in effect (meaning this test can be used) for the duration of the  Covid-19 declaration under Section 564(b)(1) of the Act, 21  U.S.C. section 360bbb-3(b)(1), unless the authorization is  terminated or revoked. Performed at [facility], [address], [place], [place] [address]        Inpatient Medications:   Scheduled Meds: . FLUoxetine  80 mg Oral Daily  . folic acid  1 mg Oral Daily  . gabapentin  600 mg Oral TID  . levothyroxine  125 mcg Oral Q0600  . pantoprazole  40 mg Oral Q1200  . prazosin  1 mg Oral QHS  . QUEtiapine  200 mg Oral QHS  . temazepam  15 mg Oral QHS  . thiamine  100 mg Oral Daily   Or  . thiamine  100 mg Intravenous Daily   Continuous Infusions:   Radiological Exams on Admission: US Abdomen Limited RUQ (LIVER/GB)  Result Date: 05/15/2020 CLINICAL DATA:  Right upper quadrant pain. EXAM: ULTRASOUND ABDOMEN LIMITED RIGHT UPPER  QUADRANT COMPARISON:  07/11/2019 FINDINGS: Gallbladder: The patient is status post prior cholecystectomy. Common bile duct: Diameter: 5 mm Liver: There is a coarsened and heterogeneous appearance of the liver parenchyma without evidence for discrete hepatic mass. Portal vein is patent on color Doppler imaging with normal direction of blood flow towards the liver. Other: None. IMPRESSION: Status post cholecystectomy. No acute abnormality. Coarsened and heterogeneous appearance of the liver parenchyma which can be seen in patients with underlying hepatocellular disease. Electronically Signed   By: Constance Holster M.D.   On: 05/15/2020 15:24    Impression/Recommendations Principal Problem:   Severe recurrent major depressive disorder with psychotic features (H[facility]) Active Problems:   PTSD (post-traumatic stress disorder)   Hypothyroid   Gastric reflux   Duodenitis   Suicidal ideation   Alcohol abuse   CVA (cerebral vascular accident) ([place])   HTN (hypertension)  Transaminitis   Hepatic cirrhosis ([facility])   Diarrhea   RUQ abdominal pain  Severe recurrent major depressive disorder with psychotic features, PTSD and suicidal ideation: pt is calm, denies suicidal or homicidal ideations. -Management per primary psychiatry team  RUQ abdominal pain, hx of hepatic cirrhosis and transaminitis: Patient's RUQ AP seems to be a chronic issue.  Patient states that he has been having intermittent right upper quadrant abdominal pain after he had cholecystectomy.  He has mild transaminitis.  No fever or leukocytosis. His WBC was 4.0 on 1/24. RUQ Ultrasound on 05/15/20 is negative for acute findings.  -will d/c tylenol -check hepatitis panel and HIV antibody -Supportive care -Check INR, PTT -Check ammonia level -f/u CMP and CBC  Addendum: Ammonia slightly elevated at 48 -lactulose 10 g daily  Hypothyroid -Synthroid  Gastric reflux and duodenitis: -Start Protonix 40 mg daily  Hx of alcohol abuse:  Patient has been in hospital since 10/21, currently patient does not have signs of withdrawal -Give vitamin B1 and folic acid  Hx of CVA (cerebral vascular accident) ([place]): not taking meds  -f/u with PCP  HTN (hypertension): Bp  115/77. Not taking meds. -prn oral hydralazine  Diarrhea: Patient has some mild diarrhea, 2 diarrhea yesterday, no diarrhea today so far.  No fever or leukocytosis.  Low suspicions for C. difficile colitis. -Check GI pathogen panel -As needed Zofran   Thank you for this consultation.  Our [facility] hospitalist team will follow the patient with you.   Time Spent:  30 min  Ivor Costa M.D. [facility] 05/17/2020, 10:19 AM

## 2020-05-17 NOTE — Plan of Care (Signed)
Patient denies SI/HI/AVH, pleasant and calm on the unit  Problem: Education: Goal: Emotional status will improve Outcome: Progressing Goal: Mental status will improve Outcome: Progressing

## 2020-05-18 DIAGNOSIS — F333 Major depressive disorder, recurrent, severe with psychotic symptoms: Principal | ICD-10-CM

## 2020-05-18 LAB — COMPREHENSIVE METABOLIC PANEL
ALT: 65 U/L — ABNORMAL HIGH (ref 0–44)
AST: 61 U/L — ABNORMAL HIGH (ref 15–41)
Albumin: 3.7 g/dL (ref 3.5–5.0)
Alkaline Phosphatase: 68 U/L (ref 38–126)
Anion gap: 7 (ref 5–15)
BUN: 12 mg/dL (ref 6–20)
CO2: 25 mmol/L (ref 22–32)
Calcium: 9.2 mg/dL (ref 8.9–10.3)
Chloride: 105 mmol/L (ref 98–111)
Creatinine, Ser: 0.78 mg/dL (ref 0.61–1.24)
GFR, Estimated: >60 mL/min (ref >60)
Glucose, Bld: 77 mg/dL (ref 70–99)
Potassium: 4.3 mmol/L (ref 3.5–5.1)
Sodium: 137 mmol/L (ref 135–145)
Total Bilirubin: 0.8 mg/dL (ref 0.3–1.2)
Total Protein: 7.8 g/dL (ref 6.5–8.1)

## 2020-05-18 LAB — CBC
HCT: 34.8 % — ABNORMAL LOW (ref 39.0–52.0)
Hemoglobin: 11.3 g/dL — ABNORMAL LOW (ref 13.0–17.0)
MCH: 27.2 pg (ref 26.0–34.0)
MCHC: 32.5 g/dL (ref 30.0–36.0)
MCV: 83.7 fL (ref 80.0–100.0)
Platelets: 136 10*3/uL — ABNORMAL LOW (ref 150–400)
RBC: 4.16 MIL/uL — ABNORMAL LOW (ref 4.22–5.81)
RDW: 20.3 % — ABNORMAL HIGH (ref 11.5–15.5)
WBC: 4.4 10*3/uL (ref 4.0–10.5)
nRBC: 0 % (ref 0.0–0.2)

## 2020-05-18 MED ORDER — PRAZOSIN HCL 1 MG PO CAPS: 1.0000 mg | ORAL_CAPSULE | Freq: Every day | ORAL | 1 refills | Status: DC

## 2020-05-18 MED ORDER — LEVOTHYROXINE SODIUM 125 MCG PO TABS: 125.0000 ug | ORAL_TABLET | Freq: Every day | ORAL | 1 refills | Status: DC

## 2020-05-18 MED ORDER — PANTOPRAZOLE SODIUM 40 MG PO TBEC
40.0000 mg | DELAYED_RELEASE_TABLET | Freq: Every day | ORAL | 1 refills | Status: DC
Start: 2020-05-18 — End: 2020-11-11

## 2020-05-18 MED ORDER — HYDRALAZINE HCL 25 MG PO TABS: 25.0000 mg | ORAL_TABLET | Freq: Three times a day (TID) | ORAL | 0 refills | Status: DC

## 2020-05-18 MED ORDER — TEMAZEPAM 15 MG PO CAPS: 15.0000 mg | ORAL_CAPSULE | Freq: Every day | ORAL | 1 refills | Status: DC

## 2020-05-18 MED ORDER — TRAZODONE HCL 50 MG PO TABS: 100.0000 mg | ORAL_TABLET | Freq: Every evening | ORAL | 1 refills | Status: DC | PRN

## 2020-05-18 MED ORDER — GABAPENTIN 600 MG PO TABS: 600.0000 mg | ORAL_TABLET | Freq: Three times a day (TID) | ORAL | 1 refills | Status: DC

## 2020-05-18 MED ORDER — HYDROXYZINE HCL 25 MG PO TABS: 25.0000 mg | ORAL_TABLET | Freq: Three times a day (TID) | ORAL | 1 refills | Status: DC | PRN

## 2020-05-18 MED ORDER — FLUOXETINE HCL 40 MG PO CAPS: 80.0000 mg | ORAL_CAPSULE | Freq: Every day | ORAL | 1 refills | Status: DC

## 2020-05-18 MED ORDER — QUETIAPINE FUMARATE 200 MG PO TABS: 200.0000 mg | ORAL_TABLET | Freq: Every day | ORAL | 1 refills | Status: DC

## 2020-05-18 NOTE — Discharge Summary (Signed)
Physician Discharge Summary Note  Patient:  Ivan Hamilton is an 55 y.o., male MRN:  [id number] DOB:  1966/02/15 Patient phone:  [phone] (home)  Patient address:   [address] [place] [address],  Total Time spent with patient: 45 minutes  Date of Admission:  05/11/2020 Date of Discharge: 05/18/2020  Reason for Admission:  This gentleman with a long history of PTSD and alcohol abuse presented to the hospital with suicidal ideation and visual hallucinations in the context of intoxication.    Principal Problem: Severe recurrent major depressive disorder with psychotic features [facility]) Discharge Diagnoses: Principal Problem:   Severe recurrent major depressive disorder with psychotic features ([place]) Active Problems:   PTSD (post-traumatic stress disorder)   Hypothyroid   Gastric reflux   Duodenitis   Alcohol abuse   CVA (cerebral vascular accident) ([place])   HTN (hypertension)   Transaminitis   Hepatic cirrhosis ([place])   RUQ abdominal pain   Past Psychiatric History: Past history of both mental health problems and substance abuse.  Prior alcohol problems at times complicated by withdrawal  Past Medical History:  Past Medical History:  Diagnosis Date  . Alcohol withdrawal ([place])   . B12 deficiency   . Chronic pain   . CVA (cerebral vascular accident) ([place])   . Hypertension   . Hypothyroidism   . Varicose veins     Past Surgical History:  Procedure Laterality Date  . CHOLECYSTECTOMY    . ESOPHAGOGASTRODUODENOSCOPY (EGD) WITH PROPOFOL N/A 10/24/2017   Procedure: ESOPHAGOGASTRODUODENOSCOPY (EGD) WITH PROPOFOL;  Surgeon: [name], MD;  Location: [facility] ENDOSCOPY;  Service: Endoscopy;  Laterality: N/A;  . ESOPHAGOGASTRODUODENOSCOPY (EGD) WITH PROPOFOL N/A 07/12/2019   Procedure: ESOPHAGOGASTRODUODENOSCOPY (EGD) WITH PROPOFOL;  Surgeon: Jonathon Bellows, MD;  Location: [facility] ENDOSCOPY;  Service: Gastroenterology;  Laterality: N/A;  . LEG SURGERY     Family History: History  reviewed. No pertinent family history. Family Psychiatric  History: Family history of alcohol use disorder Social History:  Social History   Substance and Sexual Activity  Alcohol Use Yes   Comment: daily- 1 pint of vodka     Social History   Substance and Sexual Activity  Drug Use No    Social History   Socioeconomic History  . Marital status: Married    Spouse name: Not on file  . Number of children: Not on file  . Years of education: Not on file  . Highest education level: Not on file  Occupational History  . Not on file  Tobacco Use  . Smoking status: Never Smoker  . Smokeless tobacco: Never Used  Vaping Use  . Vaping Use: Never used  Substance and Sexual Activity  . Alcohol use: Yes    Comment: daily- 1 pint of vodka  . Drug use: No  . Sexual activity: Not on file  Other Topics Concern  . Not on file  Social History Narrative  . Not on file   Social Determinants of Health   Financial Resource Strain:   . Difficulty of Paying Living Expenses: Not on file  Food Insecurity:   . Worried About Charity fundraiser in the Last Year: Not on file  . Ran Out of Food in the Last Year: Not on file  Transportation Needs:   . Lack of Transportation (Medical): Not on file  . Lack of Transportation (Non-Medical): Not on file  Physical Activity:   . Days of Exercise per Week: Not on file  . Minutes of Exercise per Session: Not on file  Stress:   . Feeling of Stress : Not on file  Social Connections:   . Frequency of Communication with Friends and Family: Not on file  . Frequency of Social Gatherings with Friends and Family: Not on file  . Attends Religious Services: Not on file  . Active Member of Clubs or Organizations: Not on file  . Attends Archivist Meetings: Not on file  . Marital Status: Not on file    Hospital Course:  This gentleman with a long history of PTSD and alcohol abuse presented to the hospital with suicidal ideation and visual  hallucinations in the context of intoxication. He was monitored via CIWA protocol, and successfully completed alcohol withdrawals with PRN ativan. His home medications were restarted for PTSD, depression, and anxiety. Prazosin 1 mg QHS started for nightmares, Seroquel 200 mg QHS started for mood and sleep, Prozac increased to 80 mg daily, Temazepam 15 mg QHS started for persistent insomnia. During hospital stay mood began to brighten, and he no longer endorsed suicidal ideations. He is motivated to take his medications as prescribed, see outpatient psychiatrist and psychologist, and attend NA meetings. He is also looking to pursue his GED at [employer].   While in the hospital, he endorsed RUQ pain, and medical consult was completed. 05/14/2020 showed ALP 90, AST 75, ALT 54, total bilirubin <0.1.  Right upper quadrant ultrasound on 05/15/2020 is negative for acute findings.Negative Hepatitis panel, negative HIV. INR and PTT within normal limits. Repeat CMP on 05/18/20 with AST 61, ALT 65. No white count or fevers during admission.   Physical Findings: AIMS: Facial and Oral Movements Muscles of Facial Expression: None, normal Lips and Perioral Area: None, normal Jaw: None, normal Tongue: None, normal,Extremity Movements Upper (arms, wrists, hands, fingers): None, normal Lower (legs, knees, ankles, toes): None, normal, Trunk Movements Neck, shoulders, hips: None, normal, Overall Severity Severity of abnormal movements (highest score from questions above): None, normal Incapacitation due to abnormal movements: None, normal Patient's awareness of abnormal movements (rate only patient's report): No Awareness, Dental Status Current problems with teeth and/or dentures?: No Does patient usually wear dentures?: No  CIWA:  CIWA-Ar Total: 2 COWS:     Musculoskeletal: Strength & Muscle Tone: within normal limits Gait & Station: normal Patient leans: N/A  Psychiatric Specialty Exam: Physical Exam Vitals and  nursing note reviewed.  Constitutional:      Appearance: Normal appearance.  HENT:     Head: Normocephalic and atraumatic.     Right Ear: External ear normal.     Left Ear: External ear normal.     Nose: Nose normal.     Mouth/Throat:     Mouth: Mucous membranes are moist.     Pharynx: Oropharynx is clear.  Eyes:     Extraocular Movements: Extraocular movements intact.     Conjunctiva/sclera: Conjunctivae normal.     Pupils: Pupils are equal, round, and reactive to light.  Cardiovascular:     Rate and Rhythm: Normal rate.     Pulses: Normal pulses.  Pulmonary:     Effort: Pulmonary effort is normal.     Breath sounds: Normal breath sounds.  Abdominal:     General: Abdomen is flat. Bowel sounds are normal.  Musculoskeletal:        General: No swelling. Normal range of motion.     Cervical back: Normal range of motion and neck supple.  Skin:    General: Skin is warm and dry.  Neurological:     General: No  focal deficit present.     Mental Status: He is alert and oriented to person, place, and time.  Psychiatric:        Mood and Affect: Mood normal.        Behavior: Behavior normal.        Thought Content: Thought content normal.        Judgment: Judgment normal.     Review of Systems  Constitutional: Negative for fatigue and fever.  HENT: Negative for postnasal drip and sore throat.   Eyes: Negative for photophobia and visual disturbance.  Respiratory: Negative for cough and shortness of breath.   Cardiovascular: Negative for chest pain and palpitations.  Gastrointestinal: Negative for constipation, diarrhea, nausea and vomiting.  Endocrine: Negative for cold intolerance and heat intolerance.  Genitourinary: Negative for difficulty urinating and dysuria.  Musculoskeletal: Negative for back pain and neck pain.  Skin: Negative for rash and wound.  Allergic/Immunologic: Negative for environmental allergies and food allergies.  Neurological: Negative for dizziness and  headaches.  Hematological: Negative for adenopathy. Does not bruise/bleed easily.  Psychiatric/Behavioral: Negative for dysphoric mood, hallucinations and suicidal ideas.    Blood pressure 108/81, pulse 75, temperature 97.9 F (36.6 C), temperature source Oral, resp. rate 18, height 5\' 5"  (1.651 m), weight 63 kg, SpO2 99 %.Body mass index is 23.13 kg/m.  General Appearance: Well Groomed  Engineer, water::  Good  Speech:  Clear and [id number]  Volume:  Normal  Mood:  Euthymic  Affect:  Appropriate and Congruent  Thought Process:  Coherent and Linear  Orientation:  Full (Time, Place, and Person)  Thought Content:  Logical  Suicidal Thoughts:  No  Homicidal Thoughts:  No  Memory:  Immediate;   Fair Recent;   Fair Remote;   Fair  Judgement:  Fair  Insight:  Fair  Psychomotor Activity:  Normal  Concentration:  Fair  Recall:  [place]  Language: Fair  Akathisia:  Negative  Handed:  Right  AIMS (if indicated):     Assets:  Communication Skills Desire for Improvement Financial Resources/Insurance Housing Resilience Social Support  Sleep:  Number of Hours: 7  Cognition: WNL  ADL's:  Intact        Have you used any form of tobacco in the last 30 days? (Cigarettes, Smokeless Tobacco, Cigars, and/or Pipes): No  Has this patient used any form of tobacco in the last 30 days? (Cigarettes, Smokeless Tobacco, Cigars, and/or Pipes)  No  Blood Alcohol level:  Lab Results  Component Value Date   ETH 379 ([place]) 05/09/2020   ETH 471 ([facility]) 54/62/7035    Metabolic Disorder Labs:  Lab Results  Component Value Date   HGBA1C 5.3 05/12/2020   MPG 105 05/12/2020   MPG 116.89 08/29/2018   No results found for: PROLACTIN Lab Results  Component Value Date   CHOL 207 (H) 05/12/2020   TRIG 72 05/12/2020   HDL 90 05/12/2020   CHOLHDL 2.3 05/12/2020   VLDL 14 05/12/2020   LDLCALC 103 ([facility]) 05/12/2020   [place] 96 08/29/2018    See Psychiatric Specialty Exam and  Suicide Risk Assessment completed by Attending Physician prior to discharge.  Discharge destination:  Home  Is patient on multiple antipsychotic therapies at discharge:  No   Has Patient had three or more failed trials of antipsychotic monotherapy by history:  No  Recommended Plan for Multiple Antipsychotic Therapies: NA  Discharge Instructions    Diet general   Complete by: As directed  Increase activity slowly   Complete by: As directed      Allergies as of 05/18/2020   No Known Allergies     Medication List    STOP taking these medications   ALPRAZolam 0.25 MG tablet Commonly known as: XANAX   busPIRone 15 MG tablet Commonly known as: BUSPAR   FLUoxetine HCl 60 MG Tabs Replaced by: FLUoxetine 40 MG capsule     TAKE these medications     Indication  FLUoxetine 40 MG capsule Commonly known as: PROZAC Take 2 capsules (80 mg total) by mouth daily. Replaces: FLUoxetine HCl 60 MG Tabs  Indication: Posttraumatic Stress Disorder   gabapentin 600 MG tablet Commonly known as: NEURONTIN Take 1 tablet (600 mg total) by mouth 3 (three) times daily.  Indication: Neuropathic Pain   hydrOXYzine 25 MG tablet Commonly known as: ATARAX/VISTARIL Take 1 tablet (25 mg total) by mouth every 8 (eight) hours as needed for itching.  Indication: Feeling Anxious   levothyroxine 125 MCG tablet Commonly known as: SYNTHROID Take 1 tablet (125 mcg total) by mouth daily at 6 (six) AM. Start taking on: May 19, 2020 What changed: when to take this  Indication: Underactive Thyroid   pantoprazole 40 MG tablet Commonly known as: PROTONIX Take 1 tablet (40 mg total) by mouth daily at 12 noon.  Indication: Gastroesophageal Reflux Disease   prazosin 1 MG capsule Commonly known as: MINIPRESS Take 1 capsule (1 mg total) by mouth at bedtime.  Indication: Frightening Dreams   QUEtiapine 200 MG tablet Commonly known as: SEROQUEL Take 1 tablet (200 mg total) by mouth at bedtime.   Indication: Posttraumatic Stress Disorder   temazepam 15 MG capsule Commonly known as: RESTORIL Take 1 capsule (15 mg total) by mouth at bedtime.  Indication: Trouble Sleeping   traZODone 50 MG tablet Commonly known as: DESYREL Take 2 tablets (100 mg total) by mouth at bedtime as needed for sleep.  Indication: Trouble Sleeping       Follow-up Information    Patient declined Follow up.   Why: Patient declined CSW assistance with scheduling aftercare appointment.               Follow-up recommendations:  Activity:  as tolerated Diet:  regular diet  Comments:  30 day prescriptions with 1 refill printed and provided to patient at discharge. Recommend follow-up with PCP for elevated transaminases.   Signed: Salley Scarlet, MD 05/18/2020, 12:19 PM

## 2020-05-18 NOTE — Progress Notes (Signed)
  [facility] Adult Case Management Discharge Plan :  Will you be returning to the same living situation after discharge:  Yes,  pt plans to return home. At discharge, do you have transportation home?: Yes,  CSW to arrange transportation.  Do you have the ability to pay for your medications: Yes,  [employer] Medicare.   Release of information consent forms completed and in the chart;  Patient's signature needed at discharge.  Patient to Follow up at:  Follow-up Information    Patient declined Follow up.   Why: Patient declined CSW assistance with scheduling aftercare appointment.               Next level of care provider has access to [place] and Suicide Prevention discussed: Yes,  SPE completed with ex-wife, Roberts Gaudy.  Have you used any form of tobacco in the last 30 days? (Cigarettes, Smokeless Tobacco, Cigars, and/or Pipes): No  Has patient been referred to the Quitline?: Patient refused referral  Patient has been referred for addiction treatment: Pt. refused referral  Shirl Harris, LCSW 05/18/2020, 10:33 AM

## 2020-05-18 NOTE — Progress Notes (Signed)
Recreation Therapy Notes   Date: 05/18/2020  Time: 9:30 am   Location: Craft room   Behavioral response: Appropriate  Intervention Topic: Happiness   Discussion/Intervention:  Group content today was focused on Happiness. The group defined happiness and described where happiness comes from. Individuals identified what makes them happy and how they go about making others happy. Patients expressed things that stop them from being happy and ways they can improve their happiness. The group stated reasons why it is important to be happy. The group participated in the intervention "My Happiness", where they had a chance to identify and express things that make them happy.  Clinical Observations/Feedback: Patient came to group late due unknown reasons and was focused on what peers and staff had to say about happiness. He stated that happiness is excitement.  Participant explained that stress keeps him from being happy. Individual was social with peers and staff while participating in the intervention.  Elio Haden LRT/CTRS          Boni Maclellan 05/18/2020 1:06 PM

## 2020-05-18 NOTE — Progress Notes (Signed)
PROGRESS NOTE  Ivan Hamilton VZD:[id number] DOB: 08-Aug-1965 DOA: 05/11/2020 PCP: Tracie Harrier, MD  Brief History   Ivan Hamilton is an 54 y.o. male with a past medical history of PTSD, depression, hypertension, stroke, GERD, hypothyroidism, varicose vein, alcohol abuse, vitamin B12 deficiency, chronic pain, duodenitis, diverticulitis, liver cirrhosis, who is admitted to behavioral health due to severe recurrent depression and PTSD with suicidal ideation on 10/21.  We are asked to consult due to diarrhea and right upper quadrant abdominal pain.  Patient states that he has history of cholecystitis .After the surgery, he has been having intermittent right upper quadrant abdominal pain.  Patient reports abdominal pain in the right upper quadrant in the past several days, which is mild to moderate, aching, nonradiating.  He has nausea, but no vomiting.  No fever or chills.  Patient has mild diarrhea.  He has had 2 episodes of diarrhea since yesterday.  Today he did not have diarrhea so far. Patient denies any chest pain, shortness breath, cough.  No symptoms of UTI or unilateral weakness.  Patient denies suicidal homicidal ideations.  Liver function on 05/14/2020 showed ALP 90, AST 75, ALT 54, total bilirubin <0.1. Hepatitis panel is negative.  Right upper quadrant ultrasound on 05/15/2020 is negative for acute findings. ETOH on 05/09/2020 was 379.  Currently blood pressure 108/81, heart rate 75, RR 18, oxygen saturation 99% on room air, temperature normal.  Patient had negative Covid PCR 05/09/2020.  Interval History/Subjective  The patient is resting quietly in his room. He continues to complain of right upper quadrant pain.  Objective   Vitals:  Vitals:   05/17/20 2107 05/18/20 0620  BP: 119/81 108/81  Pulse: 75 75  Resp:  18  Temp:  97.9 F (36.6 C)  SpO2:  99%    Exam:  Constitutional:  . The patient is awake, alert, and oriented x 3. No acute distress. Respiratory:    . No increased work of breathing. . No wheezes, rales, or rhonchi . No tactile fremitus Cardiovascular:  . Regular rate and rhythm . No murmurs, ectopy, or gallups. . No lateral PMI. No thrills. Abdomen:  . Abdomen is soft, non-tender, non-distended . No hernias, masses, or organomegaly . Normoactive bowel sounds.  Musculoskeletal:  . No cyanosis, clubbing, or edema Skin:  . No rashes, lesions, ulcers . palpation of skin: no induration or nodules Neurologic:  . CN 2-12 intact . Sensation all 4 extremities intact Psychiatric:  . Mental status o Mood, affect appropriate o Orientation to person, place, time  . judgment and insight appear intact   I have personally reviewed the following:   Today's Data  . Vitals, CBC, CMP, hepatitis panel  Imaging  . Right upper quadrant ultrasound  Scheduled Meds: . FLUoxetine  80 mg Oral Daily  . folic acid  1 mg Oral Daily  . gabapentin  600 mg Oral TID  . lactulose  10 g Oral Daily  . levothyroxine  125 mcg Oral Q0600  . pantoprazole  40 mg Oral Q1200  . prazosin  1 mg Oral QHS  . QUEtiapine  200 mg Oral QHS  . temazepam  15 mg Oral QHS  . thiamine  100 mg Oral Daily   Or  . thiamine  100 mg Intravenous Daily   Continuous Infusions:  Principal Problem:   Severe recurrent major depressive disorder with psychotic features ([facility]) Active Problems:   PTSD (post-traumatic stress disorder)   Hypothyroid   Gastric reflux   Duodenitis   Alcohol abuse  CVA (cerebral vascular accident) ([place])   HTN (hypertension)   Transaminitis   Hepatic cirrhosis ([place])   RUQ abdominal pain   LOS: 7 days   A & P  Severe recurrent major depressive disorder with psychotic features, PTSD and suicidal ideation: Pt is calm, denies suicidal or homicidal ideations. Management per primary psychiatry team. It appears that they are prepared to discharge the patient today. This seems appropriate from hospitalists standpoint.  RUQ abdominal pain, hx  of hepatic cirrhosis and transaminitis: Patient's RUQ AP seems to be a chronic issue. It is likely exacerbated by alcoholic hepatitis. The patient presented with an alcohol level of 379 after a period of about 2 months abstinence. There is likely swelling of the liver against the capsule causing his discomfort. RUQ ultrasound 05/15/4020 did not demonstrate a cause for the patient's pain. Hepatitis panel was negative. LFT's are trending down. He continues to have some RUQ pain, but states that it is getting better.   Patient states that he has been having intermittent right upper quadrant abdominal pain after he had cholecystectomy.  He has mild transaminitis.  No fever or leukocytosis. The patient is advised to avoid tylenol and alcohol. He should follow up with outpatient resources to support his abstinence from alcohol.   Hypothyroid: Synthroid  Gastric reflux and duodenitis: Start Protonix 40 mg daily  Hx of alcohol abuse: Patient has been in hospital since 10/21, currently patient does not have signs of withdrawal. He has been supplemented with Vitamin B1 and folic acid. He has been admonished against further alcohol use. It is likely to result, as his most recent binge has, in exacerbation of alcoholic hepatitis and right upper quadrant pain and contribute to liver disease. He has voiced understanding.  Hx of CVA (cerebral vascular accident) [facility]): not taking meds. The patient needs to f/u with PCP  HTN (hypertension): Bp  115/77. Not taking meds. Will recommend continuation of hydralazine as outpatient.  Diarrhea: Resolved.  Patient has some mild diarrhea, 2 diarrhea yesterday, no diarrhea today so far.  No fever or leukocytosis.  Low suspicions for C. difficile colitis.  I have seen and examined this patient myself. I have spent 35 minutes in his evaluation and care.  Lillieanna Tuohy, DO [facility] Direct contact: see [email or web address]  7PM-7AM contact night coverage as  above 05/18/2020, 12:44 PM  LOS: 7 days

## 2020-05-18 NOTE — Progress Notes (Signed)
Pt denies SI, Hi and AVH. Pt was given prescriptions, transition packet and belongings.Pt was educated on dc plan and verbalizes understanding. Collier Bullock RN

## 2020-05-18 NOTE — Tx Team (Signed)
Interdisciplinary Treatment and Diagnostic Plan Update  05/18/2020 Time of Session: 09:00 Ivan Hamilton MRN: [id number]  Principal Diagnosis: Severe recurrent major depressive disorder with psychotic features [facility])  Secondary Diagnoses: Principal Problem:   Severe recurrent major depressive disorder with psychotic features ([place]) Active Problems:   PTSD (post-traumatic stress disorder)   Hypothyroid   Gastric reflux   Duodenitis   Suicidal ideation   Alcohol abuse   CVA (cerebral vascular accident) ([place])   HTN (hypertension)   Transaminitis   Hepatic cirrhosis ([place])   Diarrhea   RUQ abdominal pain   Current Medications:  Current Facility-Administered Medications  Medication Dose Route Frequency Provider Last Rate Last Admin   alum & mag hydroxide-simeth (MAALOX/MYLANTA) 200-200-20 MG/5ML suspension 30 mL  30 mL Oral Q4H PRN Clapacs, John T, MD   30 mL at 05/16/20 4854   bismuth subsalicylate (PEPTO BISMOL) 262 MG/15ML suspension 30 mL  30 mL Oral Q4H PRN Salley Scarlet, MD   30 mL at 05/16/20 1645   FLUoxetine (PROZAC) capsule 80 mg  80 mg Oral Daily Rulon Sera, MD   80 mg at 62/70/35 0093   folic acid (FOLVITE) tablet 1 mg  1 mg Oral Daily Ivor Costa, MD   1 mg at 05/18/20 8182   gabapentin (NEURONTIN) tablet 600 mg  600 mg Oral TID Clapacs, John T, MD   600 mg at 05/18/20 9937   hydrALAZINE (APRESOLINE) tablet 25 mg  25 mg Oral Q8H PRN Ivor Costa, MD       hydrOXYzine (ATARAX/VISTARIL) tablet 25 mg  25 mg Oral Q8H PRN Clapacs, John T, MD   25 mg at 05/17/20 1649   ibuprofen (ADVIL) tablet 600 mg  600 mg Oral Q8H PRN Clapacs, John T, MD   600 mg at 05/15/20 2239   lactulose ([place]) 10 GM/15ML solution 10 g  10 g Oral Daily Ivor Costa, MD   10 g at 05/18/20 1696   levothyroxine (SYNTHROID) tablet 125 mcg  125 mcg Oral Q0600 Clapacs, John T, MD   125 mcg at 05/18/20 0641   magnesium hydroxide (MILK OF MAGNESIA) suspension 30 mL  30 mL Oral Daily PRN Clapacs,  Madie Reno, MD       ondansetron (ZOFRAN) tablet 4 mg  4 mg Oral Q6H PRN Ivor Costa, MD       pantoprazole (PROTONIX) EC tablet 40 mg  40 mg Oral Q1200 Ivor Costa, MD   40 mg at 05/17/20 1216   prazosin (MINIPRESS) capsule 1 mg  1 mg Oral QHS Salley Scarlet, MD   1 mg at 05/17/20 2107   QUEtiapine (SEROQUEL) tablet 200 mg  200 mg Oral QHS Rulon Sera, MD   200 mg at 05/17/20 2107   temazepam (RESTORIL) capsule 15 mg  15 mg Oral QHS Rulon Sera, MD   15 mg at 05/17/20 2107   thiamine tablet 100 mg  100 mg Oral Daily Clapacs, John T, MD   100 mg at 05/18/20 7893   Or   thiamine (B-1) injection 100 mg  100 mg Intravenous Daily Clapacs, Madie Reno, MD       traZODone (DESYREL) tablet 100 mg  100 mg Oral QHS PRN Clapacs, Madie Reno, MD   100 mg at 05/17/20 2107   PTA Medications: Medications Prior to Admission  Medication Sig Dispense Refill Last Dose   ALPRAZolam (XANAX) 0.25 MG tablet Take 0.25 mg by mouth at bedtime as needed for sleep.      busPIRone (BUSPAR) 15  MG tablet Take 1 tablet (15 mg total) by mouth 2 (two) times daily. 60 tablet 0    FLUoxetine HCl 60 MG TABS Take 60 mg by mouth daily at 12 noon.      hydrOXYzine (ATARAX/VISTARIL) 25 MG tablet Take 25 mg by mouth every 8 (eight) hours as needed for itching.       levothyroxine (SYNTHROID) 125 MCG tablet Take 1 tablet (125 mcg total) by mouth daily before breakfast. 30 tablet 0    traZODone (DESYREL) 50 MG tablet Take 100 mg by mouth at bedtime as needed for sleep.       Patient Stressors: Marital or family conflict Substance abuse  Patient Strengths: Capable of independent living Curator fund of knowledge Motivation for treatment/growth  Treatment Modalities: Medication Management, Group therapy, Case management,  1 to 1 session with clinician, Psychoeducation, Recreational therapy.   Physician Treatment Plan for Primary Diagnosis: Severe recurrent major depressive disorder with psychotic features  ([place]) Long Term Goal(s): Improvement in symptoms so as ready for discharge Improvement in symptoms so as ready for discharge   Short Term Goals: Ability to identify changes in lifestyle to reduce recurrence of condition will improve Ability to verbalize feelings will improve Ability to disclose and discuss suicidal ideas Ability to demonstrate self-control will improve Ability to identify and develop effective coping behaviors will improve Compliance with prescribed medications will improve Ability to identify triggers associated with substance abuse/mental health issues will improve Ability to identify changes in lifestyle to reduce recurrence of condition will improve Ability to verbalize feelings will improve Ability to disclose and discuss suicidal ideas Ability to demonstrate self-control will improve Ability to identify and develop effective coping behaviors will improve Compliance with prescribed medications will improve Ability to identify triggers associated with substance abuse/mental health issues will improve  Medication Management: Evaluate patient's response, side effects, and tolerance of medication regimen.  Therapeutic Interventions: 1 to 1 sessions, Unit Group sessions and Medication administration.  Evaluation of Outcomes: Adequate for Discharge  Physician Treatment Plan for Secondary Diagnosis: Principal Problem:   Severe recurrent major depressive disorder with psychotic features ([place]) Active Problems:   PTSD (post-traumatic stress disorder)   Hypothyroid   Gastric reflux   Duodenitis   Suicidal ideation   Alcohol abuse   CVA (cerebral vascular accident) ([place])   HTN (hypertension)   Transaminitis   Hepatic cirrhosis ([place])   Diarrhea   RUQ abdominal pain  Long Term Goal(s): Improvement in symptoms so as ready for discharge Improvement in symptoms so as ready for discharge   Short Term Goals: Ability to identify changes in lifestyle to reduce recurrence  of condition will improve Ability to verbalize feelings will improve Ability to disclose and discuss suicidal ideas Ability to demonstrate self-control will improve Ability to identify and develop effective coping behaviors will improve Compliance with prescribed medications will improve Ability to identify triggers associated with substance abuse/mental health issues will improve Ability to identify changes in lifestyle to reduce recurrence of condition will improve Ability to verbalize feelings will improve Ability to disclose and discuss suicidal ideas Ability to demonstrate self-control will improve Ability to identify and develop effective coping behaviors will improve Compliance with prescribed medications will improve Ability to identify triggers associated with substance abuse/mental health issues will improve     Medication Management: Evaluate patient's response, side effects, and tolerance of medication regimen.  Therapeutic Interventions: 1 to 1 sessions, Unit Group sessions and Medication administration.  Evaluation of Outcomes: Adequate for Discharge  RN Treatment Plan for Primary Diagnosis: Severe recurrent major depressive disorder with psychotic features ([place]) Long Term Goal(s): Knowledge of disease and therapeutic regimen to maintain health will improve  Short Term Goals: Ability to remain free from injury will improve, Ability to verbalize frustration and anger appropriately will improve, Ability to demonstrate self-control, Ability to participate in decision making will improve, Ability to verbalize feelings will improve, Ability to disclose and discuss suicidal ideas, Ability to identify and develop effective coping behaviors will improve and Compliance with prescribed medications will improve  Medication Management: RN will administer medications as ordered by provider, will assess and evaluate patient's response and provide education to patient for prescribed  medication. RN will report any adverse and/or side effects to prescribing provider.  Therapeutic Interventions: 1 on 1 counseling sessions, Psychoeducation, Medication administration, Evaluate responses to treatment, Monitor vital signs and CBGs as ordered, Perform/monitor CIWA, COWS, AIMS and Fall Risk screenings as ordered, Perform wound care treatments as ordered.  Evaluation of Outcomes: Adequate for Discharge   LCSW Treatment Plan for Primary Diagnosis: Severe recurrent major depressive disorder with psychotic features ([place]) Long Term Goal(s): Safe transition to appropriate next level of care at discharge, Engage patient in therapeutic group addressing interpersonal concerns.  Short Term Goals: Engage patient in aftercare planning with referrals and resources, Increase social support, Increase ability to appropriately verbalize feelings, Increase emotional regulation, Facilitate acceptance of mental health diagnosis and concerns, Identify triggers associated with mental health/substance abuse issues and Increase skills for wellness and recovery  Therapeutic Interventions: Assess for all discharge needs, 1 to 1 time with Social worker, Explore available resources and support systems, Assess for adequacy in community support network, Educate family and significant other(s) on suicide prevention, Complete Psychosocial Assessment, Interpersonal group therapy.  Evaluation of Outcomes: Adequate for Discharge   Progress in Treatment: Attending groups: Yes. Participating in groups: Yes. Taking medication as prescribed: Yes. Toleration medication: Yes. Family/Significant other contact made: Yes, individual(s) contacted:  SPE completed with ex-wife, Ivan Hamilton Patient understands diagnosis: Yes. Discussing patient identified problems/goals with staff: Yes. Medical problems stabilized or resolved: Yes. Denies suicidal/homicidal ideation: Yes. Issues/concerns per patient self-inventory:  No. Other: None   New problem(s) identified: No, Describe:  None.  New Short Term/Long Term Goal(s): detox, elimination of symptoms of psychosis, medication management for mood stabilization; elimination of SI thoughts; development of comprehensive mental wellness/sobriety plan. Update 05/18/20: Pt set for discharge. States he is ready to go home.  Patient Goals: "I think I need more therapy." Update 05/18/20: No changes currently.    Discharge Plan or Barriers: Patient expressed interest in continued treatment whether outpatient or inpatient. Update 05/18/20: Patient declines CSW assistance with aftercare appointment scheduling. He was given Futures trader of providers (psychiatrist and therapists) who accept his insurance, even those specifically for substance use and trauma. He states his daughter will help him with making his aftercare appointment when he gets home.  Reason for Continuation of Hospitalization: Anxiety Depression Hallucinations Suicidal ideation Withdrawal symptoms  Estimated Length of Stay: 1-7 days  Attendees: Patient: 05/18/2020 10:37 AM  Physician: Selina Cooley, MD 05/18/2020 10:37 AM  Nursing: 05/18/2020 10:37 AM  RN Care Manager: 05/18/2020 10:37 AM  Social Worker: Assunta Curtis, MSW, LCSW 05/18/2020 10:37 AM  Recreational Therapist: 05/18/2020 10:37 AM  Other: Chalmers Guest. Guerry Bruin, MSW, [place], [place] 05/18/2020 10:37 AM  Other:  05/18/2020 10:37 AM  Other: 05/18/2020 10:37 AM    Scribe for Treatment Team: Shirl Harris, LCSW 05/18/2020 10:37 AM

## 2020-05-18 NOTE — Plan of Care (Signed)
Pt rates depression 8/10, hopelessness 6/10 and anxiety 7/10. Pt denies SI, HI and AVH. Pt was educated on care plan and verbalizes understanding. Collier Bullock RN Problem: Education: Goal: Knowledge of [facility] Education information/materials will improve 05/18/2020 1009 by Kieth Brightly, RN Outcome: Adequate for Discharge 05/18/2020 1008 by Kieth Brightly, RN Outcome: Progressing Goal: Emotional status will improve 05/18/2020 1009 by Kieth Brightly, RN Outcome: Adequate for Discharge 05/18/2020 1008 by Kieth Brightly, RN Outcome: Progressing Goal: Mental status will improve 05/18/2020 1009 by Kieth Brightly, RN Outcome: Adequate for Discharge 05/18/2020 1008 by Kieth Brightly, RN Outcome: Progressing Goal: Verbalization of understanding the information provided will improve 05/18/2020 1009 by Kieth Brightly, RN Outcome: Adequate for Discharge 05/18/2020 1008 by Kieth Brightly, RN Outcome: Progressing   Problem: Activity: Goal: Interest or engagement in activities will improve 05/18/2020 1009 by Kieth Brightly, RN Outcome: Adequate for Discharge 05/18/2020 1008 by Kieth Brightly, RN Outcome: Progressing Goal: Sleeping patterns will improve 05/18/2020 1009 by Kieth Brightly, RN Outcome: Adequate for Discharge 05/18/2020 1008 by Kieth Brightly, RN Outcome: Progressing   Problem: Coping: Goal: Ability to verbalize frustrations and anger appropriately will improve 05/18/2020 1009 by Kieth Brightly, RN Outcome: Adequate for Discharge 05/18/2020 1008 by Kieth Brightly, RN Outcome: Progressing Goal: Ability to demonstrate self-control will improve 05/18/2020 1009 by Kieth Brightly, RN Outcome: Adequate for Discharge 05/18/2020 1008 by Kieth Brightly, RN Outcome: Progressing   Problem: Health Behavior/Discharge Planning: Goal: Identification of resources available to assist in meeting health care needs will improve 05/18/2020 1009 by Kieth Brightly, RN Outcome: Adequate for Discharge 05/18/2020 1008 by Kieth Brightly, RN Outcome: Progressing Goal: Compliance with treatment plan for underlying cause of condition will improve 05/18/2020 1009 by Kieth Brightly, RN Outcome: Adequate for Discharge 05/18/2020 1008 by Kieth Brightly, RN Outcome: Progressing   Problem: Physical Regulation: Goal: Ability to maintain clinical measurements within normal limits will improve 05/18/2020 1009 by Kieth Brightly, RN Outcome: Adequate for Discharge 05/18/2020 1008 by Kieth Brightly, RN Outcome: Progressing   Problem: Safety: Goal: Periods of time without injury will increase 05/18/2020 1009 by Kieth Brightly, RN Outcome: Adequate for Discharge 05/18/2020 1008 by Kieth Brightly, RN Outcome: Progressing   Problem: Education: Goal: Knowledge of disease or condition will improve 05/18/2020 1009 by Kieth Brightly, RN Outcome: Adequate for Discharge 05/18/2020 1008 by Kieth Brightly, RN Outcome: Progressing Goal: Understanding of discharge needs will improve 05/18/2020 1009 by Kieth Brightly, RN Outcome: Adequate for Discharge 05/18/2020 1008 by Kieth Brightly, RN Outcome: Progressing   Problem: Health Behavior/Discharge Planning: Goal: Ability to identify changes in lifestyle to reduce recurrence of condition will improve 05/18/2020 1009 by Kieth Brightly, RN Outcome: Adequate for Discharge 05/18/2020 1008 by Kieth Brightly, RN Outcome: Progressing Goal: Identification of resources available to assist in meeting health care needs will improve 05/18/2020 1009 by Kieth Brightly, RN Outcome: Adequate for Discharge 05/18/2020 1008 by Kieth Brightly, RN Outcome: Progressing   Problem: Physical Regulation: Goal: Complications related to the disease process, condition or treatment will be avoided or minimized 05/18/2020 1009 by Kieth Brightly, RN Outcome: Adequate for Discharge 05/18/2020 1008 by Kieth Brightly,  RN Outcome: Progressing   Problem: Safety: Goal: Ability to remain free from injury will improve 05/18/2020 1009 by Kieth Brightly, RN Outcome: Adequate for Discharge 05/18/2020 1008 by Collier Bullock  M, RN Outcome: Progressing   Problem: Education: Goal: Ability to make informed decisions regarding treatment will improve 05/18/2020 1009 by Kieth Brightly, RN Outcome: Adequate for Discharge 05/18/2020 1008 by Kieth Brightly, RN Outcome: Progressing   Problem: Coping: Goal: Coping ability will improve 05/18/2020 1009 by Kieth Brightly, RN Outcome: Adequate for Discharge 05/18/2020 1008 by Kieth Brightly, RN Outcome: Progressing   Problem: Health Behavior/Discharge Planning: Goal: Identification of resources available to assist in meeting health care needs will improve 05/18/2020 1009 by Kieth Brightly, RN Outcome: Adequate for Discharge 05/18/2020 1008 by Kieth Brightly, RN Outcome: Progressing   Problem: Medication: Goal: Compliance with prescribed medication regimen will improve 05/18/2020 1009 by Kieth Brightly, RN Outcome: Adequate for Discharge 05/18/2020 1008 by Kieth Brightly, RN Outcome: Progressing   Problem: Self-Concept: Goal: Ability to disclose and discuss suicidal ideas will improve 05/18/2020 1009 by Kieth Brightly, RN Outcome: Adequate for Discharge 05/18/2020 1008 by Kieth Brightly, RN Outcome: Progressing Goal: Will verbalize positive feelings about self 05/18/2020 1009 by Kieth Brightly, RN Outcome: Adequate for Discharge 05/18/2020 1008 by Kieth Brightly, RN Outcome: Progressing

## 2020-05-18 NOTE — BHH Suicide Risk Assessment (Signed)
[facility] Discharge Suicide Risk Assessment   Principal Problem: Severe recurrent major depressive disorder with psychotic features [facility]) Discharge Diagnoses: Principal Problem:   Severe recurrent major depressive disorder with psychotic features ([place]) Active Problems:   PTSD (post-traumatic stress disorder)   Hypothyroid   Gastric reflux   Duodenitis   Alcohol abuse   CVA (cerebral vascular accident) ([place])   HTN (hypertension)   Transaminitis   Hepatic cirrhosis ([place])   RUQ abdominal pain   Total Time spent with patient: 45 minutes  Musculoskeletal: Strength & Muscle Tone: within normal limits Gait & Station: normal Patient leans: N/A  Psychiatric Specialty Exam: Review of Systems  Blood pressure 108/81, pulse 75, temperature 97.9 F (36.6 C), temperature source Oral, resp. rate 18, height 5\' 5"  (1.651 m), weight 63 kg, SpO2 99 %.Body mass index is 23.13 kg/m.  General Appearance: Well Groomed  Engineer, water::  Good  Speech:  Clear and [id number]  Volume:  Normal  Mood:  Euthymic  Affect:  Appropriate and Congruent  Thought Process:  Coherent and Linear  Orientation:  Full (Time, Place, and Person)  Thought Content:  Logical  Suicidal Thoughts:  No  Homicidal Thoughts:  No  Memory:  Immediate;   Fair Recent;   Fair Remote;   Fair  Judgement:  Fair  Insight:  Fair  Psychomotor Activity:  Normal  Concentration:  Fair  Recall:  [employer] of Knowledge:Fair  Language: Fair  Akathisia:  Negative  Handed:  Right  AIMS (if indicated):     Assets:  Communication Skills Desire for Improvement Financial Resources/Insurance Housing Resilience Social Support  Sleep:  Number of Hours: 7  Cognition: WNL  ADL's:  Intact    Mental Status Per Nursing Assessment::   On Admission:  Suicidal ideation indicated by patient  Demographic Factors:  Male and Unemployed  Loss Factors: NA  Historical Factors: NA  Risk Reduction Factors:   Sense of responsibility to  family, Religious beliefs about death, Positive social support, Positive therapeutic relationship and Positive coping skills or problem solving skills  Continued Clinical Symptoms:  Alcohol/Substance Abuse/Dependencies More than one psychiatric diagnosis Previous Psychiatric Diagnoses and Treatments Medical Diagnoses and Treatments/Surgeries  Cognitive Features That Contribute To Risk:  None    Suicide Risk:  Minimal: No identifiable suicidal ideation.  Patients presenting with no risk factors but with morbid ruminations; may be classified as minimal risk based on the severity of the depressive symptoms   Follow-up Information    Patient declined Follow up.   Why: Patient declined CSW assistance with scheduling aftercare appointment.               Plan Of Care/Follow-up recommendations:  Activity:  as tolerated Diet:  regular diet  Salley Scarlet, MD 05/18/2020, 12:15 PM

## 2020-06-02 DIAGNOSIS — R790 Abnormal level of blood mineral: Secondary | ICD-10-CM | POA: Diagnosis not present

## 2020-06-02 DIAGNOSIS — F1024 Alcohol dependence with alcohol-induced mood disorder: Secondary | ICD-10-CM | POA: Diagnosis not present

## 2020-06-02 DIAGNOSIS — F331 Major depressive disorder, recurrent, moderate: Secondary | ICD-10-CM | POA: Diagnosis not present

## 2020-06-02 DIAGNOSIS — Z9181 History of falling: Secondary | ICD-10-CM | POA: Diagnosis not present

## 2020-06-02 DIAGNOSIS — Z79899 Other long term (current) drug therapy: Secondary | ICD-10-CM | POA: Diagnosis not present

## 2020-06-02 DIAGNOSIS — F1020 Alcohol dependence, uncomplicated: Secondary | ICD-10-CM | POA: Diagnosis not present

## 2020-06-02 DIAGNOSIS — E039 Hypothyroidism, unspecified: Secondary | ICD-10-CM | POA: Diagnosis not present

## 2020-06-02 DIAGNOSIS — Z125 Encounter for screening for malignant neoplasm of prostate: Secondary | ICD-10-CM | POA: Diagnosis not present

## 2020-06-02 DIAGNOSIS — R945 Abnormal results of liver function studies: Secondary | ICD-10-CM | POA: Diagnosis not present

## 2020-06-06 DIAGNOSIS — F32A Depression, unspecified: Secondary | ICD-10-CM | POA: Diagnosis not present

## 2020-06-06 DIAGNOSIS — M5416 Radiculopathy, lumbar region: Secondary | ICD-10-CM | POA: Diagnosis not present

## 2020-06-06 DIAGNOSIS — D649 Anemia, unspecified: Secondary | ICD-10-CM | POA: Diagnosis not present

## 2020-06-06 DIAGNOSIS — G4700 Insomnia, unspecified: Secondary | ICD-10-CM | POA: Diagnosis not present

## 2020-06-06 DIAGNOSIS — F1010 Alcohol abuse, uncomplicated: Secondary | ICD-10-CM | POA: Diagnosis not present

## 2020-06-06 DIAGNOSIS — Z79899 Other long term (current) drug therapy: Secondary | ICD-10-CM | POA: Diagnosis not present

## 2020-06-06 DIAGNOSIS — Z23 Encounter for immunization: Secondary | ICD-10-CM | POA: Diagnosis not present

## 2020-06-06 DIAGNOSIS — E039 Hypothyroidism, unspecified: Secondary | ICD-10-CM | POA: Diagnosis not present

## 2020-07-16 ENCOUNTER — Emergency Department
Admission: EM | Admit: 2020-07-16 | Discharge: 2020-07-18 | Disposition: A | Discharge status: Home / Self Care | Payer: Medicare HMO | Attending: Emergency Medicine | Admitting: Emergency Medicine

## 2020-07-16 ENCOUNTER — Other Ambulatory Visit: Payer: Self-pay

## 2020-07-16 DIAGNOSIS — Z20822 Contact with and (suspected) exposure to covid-19: Secondary | ICD-10-CM | POA: Insufficient documentation

## 2020-07-16 DIAGNOSIS — Z79899 Other long term (current) drug therapy: Secondary | ICD-10-CM | POA: Insufficient documentation

## 2020-07-16 DIAGNOSIS — E039 Hypothyroidism, unspecified: Secondary | ICD-10-CM | POA: Diagnosis not present

## 2020-07-16 DIAGNOSIS — R45851 Suicidal ideations: Secondary | ICD-10-CM | POA: Insufficient documentation

## 2020-07-16 DIAGNOSIS — G8929 Other chronic pain: Secondary | ICD-10-CM | POA: Diagnosis not present

## 2020-07-16 DIAGNOSIS — F1010 Alcohol abuse, uncomplicated: Secondary | ICD-10-CM

## 2020-07-16 DIAGNOSIS — R109 Unspecified abdominal pain: Secondary | ICD-10-CM | POA: Insufficient documentation

## 2020-07-16 DIAGNOSIS — I10 Essential (primary) hypertension: Secondary | ICD-10-CM | POA: Insufficient documentation

## 2020-07-16 DIAGNOSIS — F1020 Alcohol dependence, uncomplicated: Secondary | ICD-10-CM | POA: Diagnosis not present

## 2020-07-16 DIAGNOSIS — F329 Major depressive disorder, single episode, unspecified: Secondary | ICD-10-CM | POA: Insufficient documentation

## 2020-07-16 DIAGNOSIS — R1011 Right upper quadrant pain: Secondary | ICD-10-CM | POA: Diagnosis not present

## 2020-07-16 DIAGNOSIS — R9431 Abnormal electrocardiogram [ECG] [EKG]: Secondary | ICD-10-CM | POA: Diagnosis not present

## 2020-07-16 LAB — COMPREHENSIVE METABOLIC PANEL
ALT: 73 U/L — ABNORMAL HIGH (ref 0–44)
AST: 89 U/L — ABNORMAL HIGH (ref 15–41)
Albumin: 4 g/dL (ref 3.5–5.0)
Alkaline Phosphatase: 68 U/L (ref 38–126)
Anion gap: 11 (ref 5–15)
BUN: 10 mg/dL (ref 6–20)
CO2: 23 mmol/L (ref 22–32)
Calcium: 8.4 mg/dL — ABNORMAL LOW (ref 8.9–10.3)
Chloride: 110 mmol/L (ref 98–111)
Creatinine, Ser: 0.82 mg/dL (ref 0.61–1.24)
GFR, Estimated: >60 mL/min (ref >60)
Glucose, Bld: 87 mg/dL (ref 70–99)
Potassium: 3.7 mmol/L (ref 3.5–5.1)
Sodium: 144 mmol/L (ref 135–145)
Total Bilirubin: 0.7 mg/dL (ref 0.3–1.2)
Total Protein: 8.1 g/dL (ref 6.5–8.1)

## 2020-07-16 LAB — URINE DRUG SCREEN, QUALITATIVE (ARMC ONLY)
Amphetamines, Ur Screen: NOT DETECTED
Barbiturates, Ur Screen: NOT DETECTED
Benzodiazepine, Ur Scrn: NOT DETECTED
Cannabinoid 50 Ng, Ur ~~LOC~~: NOT DETECTED
Cocaine Metabolite,Ur ~~LOC~~: NOT DETECTED
MDMA (Ecstasy)Ur Screen: NOT DETECTED
Methadone Scn, Ur: NOT DETECTED
Opiate, Ur Screen: NOT DETECTED
Phencyclidine (PCP) Ur S: NOT DETECTED
Tricyclic, Ur Screen: POSITIVE — AB

## 2020-07-16 LAB — CBC
HCT: 38.5 % — ABNORMAL LOW (ref 39.0–52.0)
Hemoglobin: 12.4 g/dL — ABNORMAL LOW (ref 13.0–17.0)
MCH: 26 pg (ref 26.0–34.0)
MCHC: 32.2 g/dL (ref 30.0–36.0)
MCV: 80.7 fL (ref 80.0–100.0)
Platelets: 164 10*3/uL (ref 150–400)
RBC: 4.77 MIL/uL (ref 4.22–5.81)
RDW: 20 % — ABNORMAL HIGH (ref 11.5–15.5)
WBC: 7.7 10*3/uL (ref 4.0–10.5)
nRBC: 0 % (ref 0.0–0.2)

## 2020-07-16 LAB — URINALYSIS, COMPLETE (UACMP) WITH MICROSCOPIC
Bacteria, UA: NONE SEEN
Bilirubin Urine: NEGATIVE
Glucose, UA: NEGATIVE mg/dL
Hgb urine dipstick: NEGATIVE
Ketones, ur: NEGATIVE mg/dL
Leukocytes,Ua: NEGATIVE
Nitrite: NEGATIVE
Protein, ur: NEGATIVE mg/dL
Specific Gravity, Urine: 1.012 (ref 1.005–1.030)
pH: 7 (ref 5.0–8.0)

## 2020-07-16 LAB — TROPONIN I (HIGH SENSITIVITY): Troponin I (High Sensitivity): 5 ng/L (ref <18)

## 2020-07-16 LAB — T4, FREE: Free T4: 0.88 ng/dL (ref 0.61–1.12)

## 2020-07-16 LAB — RESP PANEL BY RT-PCR (FLU A&B, COVID) ARPGX2
Influenza A by PCR: NEGATIVE
Influenza B by PCR: NEGATIVE
SARS Coronavirus 2 by RT PCR: NEGATIVE

## 2020-07-16 LAB — LIPASE, BLOOD: Lipase: 27 U/L (ref 11–51)

## 2020-07-16 LAB — ETHANOL: Alcohol, Ethyl (B): 261 mg/dL — ABNORMAL HIGH (ref <10)

## 2020-07-16 LAB — TSH: TSH: 0.843 u[IU]/mL (ref 0.350–4.500)

## 2020-07-16 MED ORDER — THIAMINE HCL 100 MG/ML IJ SOLN: 100.0000 mg | Freq: Every day | INTRAMUSCULAR | Status: DC

## 2020-07-16 MED ORDER — LORAZEPAM 2 MG/ML IJ SOLN: 0.0000 mg | Freq: Four times a day (QID) | INTRAMUSCULAR | Status: DC

## 2020-07-16 MED ORDER — LORAZEPAM 2 MG/ML IJ SOLN: 0.0000 mg | Freq: Two times a day (BID) | INTRAMUSCULAR | Status: DC

## 2020-07-16 MED ORDER — HALOPERIDOL LACTATE 5 MG/ML IJ SOLN
2.0000 mg | Freq: Once | INTRAMUSCULAR | Status: AC
  Administered 2020-07-16: 19:00:00 2 mg via INTRAVENOUS
  Filled 2020-07-16: qty 1

## 2020-07-16 MED ORDER — LORAZEPAM 2 MG PO TABS
0.0000 mg | ORAL_TABLET | Freq: Four times a day (QID) | ORAL | Status: DC
  Administered 2020-07-17: 20:00:00 2 mg via ORAL
  Administered 2020-07-17 – 2020-07-18 (×2): 1 mg via ORAL
  Filled 2020-07-16 (×3): qty 1

## 2020-07-16 MED ORDER — THIAMINE HCL 100 MG PO TABS
100.0000 mg | ORAL_TABLET | Freq: Every day | ORAL | Status: DC
  Administered 2020-07-16 – 2020-07-18 (×3): 100 mg via ORAL
  Filled 2020-07-16 (×2): qty 1

## 2020-07-16 MED ORDER — LORAZEPAM 2 MG PO TABS: 0.0000 mg | ORAL_TABLET | Freq: Two times a day (BID) | ORAL | Status: DC

## 2020-07-16 NOTE — ED Notes (Signed)
Pt dressed out into hospital provided attire with this tech and Lana, RN in the rm. Pt belongings consist of a black jacket, blue shoes, camouflage pants, camouflage hat, wallet, black shirt, gray shirt, red shirt and blue boxers. Pt calm and cooperative while dressing out. Pt belongings placed into two pt belongings bags and labeled with pt name.

## 2020-07-16 NOTE — ED Notes (Signed)
Gave pt food tray with juice. 

## 2020-07-16 NOTE — ED Notes (Signed)
IVC moved to [facility] 

## 2020-07-16 NOTE — ED Provider Notes (Signed)
[facility] Emergency Department Provider Note   ____________________________________________   Event Date/Time   First MD Initiated Contact with Patient 07/16/20 1613     (approximate)  I have reviewed the triage vital signs and the nursing notes.   HISTORY  Chief Complaint Abdominal Pain    HPI Ivan Hamilton is a 54 y.o. male with past medical history of hypertension, alcohol abuse, cirrhosis, and major depressive disorder who presents to the ED complaining of abdominal pain and suicidal ideation.  Patient reports that he has been dealing with pain in the right upper quadrant of his abdomen "for a long time."  He describes the pain as intermittent, not exacerbated or alleviated by anything.  He states he feels like it got worse last night after he drank some alcohol.  He admits to daily drinking and typically drinks tequila.  He states he drinks about a half 1/5/day with his last drink coming last night.  He denies any nausea, vomiting, fevers, changes in bowel movements, or urinary symptoms.  He also report suicidal ideation for the past few days with a plan to "cut my veins out."        Past Medical History:  Diagnosis Date  . Alcohol withdrawal ([place])   . B12 deficiency   . Chronic pain   . CVA (cerebral vascular accident) ([place])   . Hypertension   . Hypothyroidism   . Varicose veins     Patient Active Problem List   Diagnosis Date Noted  . RUQ abdominal pain 05/17/2020  . Lip swelling   . Hypotension 01/10/2020  . Metabolic acidosis   . Hepatic cirrhosis ([place])   . Diverticulitis   . Transaminitis   . Syncope 07/11/2019  . Abdominal pain 07/11/2019  . Alcohol abuse 07/11/2019  . Thrombocytopenia ([place]) 07/11/2019  . Normocytic anemia 07/11/2019  . Tobacco abuse 07/11/2019  . HTN (hypertension) 07/11/2019  . CVA (cerebral vascular accident) ([place])   . Major psychotic depression, recurrent ([place]) 06/01/2019  . Hyperbilirubinemia  12/23/2018  . Suicidal behavior 08/28/2018  . Severe recurrent major depressive disorder with psychotic features ([place]) 08/28/2018  . Alcohol intoxication ([place]) 08/28/2018  . S/P laparoscopic cholecystectomy 11/02/2017  . Duodenitis   . Gastritis without bleeding   . Hypokalemia 10/22/2017  . Intractable vomiting with nausea   . Anxiety and depression 04/10/2016  . Alcohol withdrawal delirium ([place])   . Alcohol withdrawal ([place]) 09/02/2015  . Alcohol use disorder, severe, dependence ([place]) 09/02/2015  . 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[place]) 09/02/2015  . PTSD (post-traumatic stress disorder) 09/01/2015  . Hypothyroid 09/01/2015  . Gastric reflux 09/01/2015  . DDD (degenerative disc disease), lumbosacral 09/01/2015  . 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 Vitamin B12 deficiency 08/17/2015  . Pain of lumbar spine 05/19/2015  . Neuropathy 05/17/2015  . Varicose vein of leg 04/25/2015  . Abnormal LFTs (liver function tests) 03/31/2015  . Confusion state 03/31/2015  . Chronic pain of left lower extremity 03/31/2015    Past Surgical History:  Procedure Laterality Date  . CHOLECYSTECTOMY    . ESOPHAGOGASTRODUODENOSCOPY (EGD) WITH PROPOFOL N/A 10/24/2017   Procedure: ESOPHAGOGASTRODUODENOSCOPY (EGD) WITH PROPOFOL;  Surgeon: [name], MD;  Location: [facility] ENDOSCOPY;  Service: Endoscopy;  Laterality: N/A;  . ESOPHAGOGASTRODUODENOSCOPY (EGD) WITH PROPOFOL N/A 07/12/2019   Procedure: ESOPHAGOGASTRODUODENOSCOPY (EGD) WITH PROPOFOL;  Surgeon: Jonathon Bellows, MD;  Location: [facility] ENDOSCOPY;  Service: Gastroenterology;  Laterality: N/A;  . LEG SURGERY  Prior to Admission medications   Medication Sig Start Date End Date Taking? Authorizing Provider  FLUoxetine (PROZAC) 40 MG capsule Take 2 capsules (80 mg total) by mouth daily. 05/18/20   Salley Scarlet, MD  gabapentin (NEURONTIN) 600 MG tablet Take 1 tablet (600 mg  total) by mouth 3 (three) times daily. 05/18/20   Salley Scarlet, MD  hydrOXYzine (ATARAX/VISTARIL) 25 MG tablet Take 1 tablet (25 mg total) by mouth every 8 (eight) hours as needed for itching. 05/18/20   Salley Scarlet, MD  levothyroxine (SYNTHROID) 125 MCG tablet Take 1 tablet (125 mcg total) by mouth daily at 6 (six) AM. 05/19/20   Salley Scarlet, MD  pantoprazole (PROTONIX) 40 MG tablet Take 1 tablet (40 mg total) by mouth daily at 12 noon. 05/18/20   Salley Scarlet, MD  prazosin (MINIPRESS) 1 MG capsule Take 1 capsule (1 mg total) by mouth at bedtime. 05/18/20   Salley Scarlet, MD  QUEtiapine (SEROQUEL) 200 MG tablet Take 1 tablet (200 mg total) by mouth at bedtime. 05/18/20   Salley Scarlet, MD  temazepam (RESTORIL) 15 MG capsule Take 1 capsule (15 mg total) by mouth at bedtime. 05/18/20   Salley Scarlet, MD  traZODone (DESYREL) 50 MG tablet Take 2 tablets (100 mg total) by mouth at bedtime as needed for sleep. 05/18/20   Salley Scarlet, MD    Allergies Patient has no known allergies.  History reviewed. No pertinent family history.  Social History Social History   Tobacco Use  . Smoking status: Never Smoker  . Smokeless tobacco: Never Used  Vaping Use  . Vaping Use: Never used  Substance Use Topics  . Alcohol use: Yes    Comment: daily- 1 pint of vodka  . Drug use: No    Review of Systems  Constitutional: No fever/chills Eyes: No visual changes. ENT: No sore throat. Cardiovascular: Denies chest pain. Respiratory: Denies shortness of breath. Gastrointestinal: Positive for abdominal pain.  No nausea, no vomiting.  No diarrhea.  No constipation. Genitourinary: Negative for dysuria. Musculoskeletal: Negative for back pain. Skin: Negative for rash. Neurological: Negative for headaches, focal weakness or numbness.  Positive for suicidal ideation.  ____________________________________________   PHYSICAL EXAM:  VITAL SIGNS: ED Triage Vitals  Enc Vitals  Group     BP 07/16/20 1541 128/85     Pulse Rate 07/16/20 1541 (!) 108     Resp 07/16/20 1541 14     Temp 07/16/20 1541 99.1 F (37.3 C)     Temp Source 07/16/20 1541 Oral     SpO2 07/16/20 1541 99 %     Weight 07/16/20 1614 138 lb 14.2 oz (63 kg)     Height 07/16/20 1614 5\' 5"  (1.651 m)     Head Circumference --      Peak Flow --      Pain Score 07/16/20 1541 10     Pain Loc --      Pain Edu? --      Excl. in [place]? --     Constitutional: Alert and oriented.  Intoxicated appearing. Eyes: Conjunctivae are normal. Head: Atraumatic. Nose: No congestion/rhinnorhea. Mouth/Throat: Mucous membranes are moist. Neck: Normal ROM Cardiovascular: Normal rate, regular rhythm. Grossly normal heart sounds. Respiratory: Normal respiratory effort.  No retractions. Lungs CTAB. Gastrointestinal: Soft and nontender. No distention. Genitourinary: deferred Musculoskeletal: No lower extremity tenderness nor edema. Neurologic:  Normal speech and language. No gross focal neurologic deficits are appreciated. Skin:  Skin is  warm, dry and intact. No rash noted. Psychiatric: Mood and affect are normal. Speech and behavior are normal.  ____________________________________________   LABS (all labs ordered are listed, but only abnormal results are displayed)  Labs Reviewed  COMPREHENSIVE METABOLIC PANEL - Abnormal; Notable for the following components:      Result Value   Calcium 8.4 (*)    AST 89 (*)    ALT 73 (*)    All other components within normal limits  CBC - Abnormal; Notable for the following components:   Hemoglobin 12.4 (*)    HCT 38.5 (*)    RDW 20.0 (*)    All other components within normal limits  URINALYSIS, COMPLETE (UACMP) WITH MICROSCOPIC - Abnormal; Notable for the following components:   Color, Urine YELLOW (*)    APPearance CLEAR (*)    All other components within normal limits  ETHANOL - Abnormal; Notable for the following components:   Alcohol, Ethyl (B) 261 (*)    All  other components within normal limits  LIPASE, BLOOD  TROPONIN I (HIGH SENSITIVITY)   ____________________________________________  EKG  ED ECG REPORT I, Blake Divine, the attending physician, personally viewed and interpreted this ECG.   Date: 07/16/2020  EKG Time: 16:02  Rate: 87  Rhythm: normal sinus rhythm  Axis: Normal  Intervals:none  ST&T Change: None   PROCEDURES  Procedure(s) performed (including Critical Care):  Procedures   ____________________________________________   INITIAL IMPRESSION / ASSESSMENT AND PLAN / ED COURSE       54 year old male with past medical history of hypertension, alcohol abuse, cirrhosis, nature depressive disorder who presents to the ED complaining of chronic intermittent upper abdominal pain along with suicidal ideation and plan to cut his wrists.  Patient has no focal tenderness on exam and is stating he has been dealing with intermittent pain in his abdomen for a long time.  Labs are unremarkable, patient with a very mild transaminitis similar to previous and lipase is within normal limits.  Given chronicity of symptoms with reassuring work-up, I do not feel CT is indicated at this time.  Patient does continue to voice suicidal ideation with plan and was placed under IVC.  We will have him evaluated by psychiatry, but he may be medically cleared.  The patient has been placed in psychiatric observation due to the need to provide a safe environment for the patient while obtaining psychiatric consultation and evaluation, as well as ongoing medical and medication management to treat the patient's condition.  The patient has been placed under full IVC at this time.       ____________________________________________   FINAL CLINICAL IMPRESSION(S) / ED DIAGNOSES  Final diagnoses:  Chronic abdominal pain  Alcohol abuse  Suicidal ideation     ED Discharge Orders    None       Note:  This document was prepared using Dragon  voice recognition software and may include unintentional dictation errors.   Blake Divine, MD 07/16/20 [phone]
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
## 2020-07-16 NOTE — ED Triage Notes (Signed)
Pt reports RUQ pain x1 day. Also endorses SI. Reports has plan to "cut my veins and die". Reports hx cirrhosis of live for ETOH abuse.

## 2020-07-16 NOTE — ED Triage Notes (Signed)
Patient states he knows he's not supposed to drink, but he had a beer and then developed abd pain. This is not unusual for him, but he also had associated SOB which worried him so he presented to ED.
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
## 2020-07-16 NOTE — ED Notes (Signed)
Patient is IVC at 4:56, and soc called at 5:22

## 2020-07-16 NOTE — ED Notes (Signed)
Patient arrived on unit.  Oriented to unit, instructed that there were cameras in all patient rooms for patient safety.  Patient verbalized understanding.

## 2020-07-17 DIAGNOSIS — G8929 Other chronic pain: Secondary | ICD-10-CM | POA: Diagnosis not present

## 2020-07-17 DIAGNOSIS — R1011 Right upper quadrant pain: Secondary | ICD-10-CM | POA: Diagnosis not present

## 2020-07-17 DIAGNOSIS — F1010 Alcohol abuse, uncomplicated: Secondary | ICD-10-CM | POA: Diagnosis not present

## 2020-07-17 DIAGNOSIS — R45851 Suicidal ideations: Secondary | ICD-10-CM | POA: Diagnosis not present

## 2020-07-17 NOTE — ED Notes (Signed)
Writer spoke with BMU RN, Junie Panning and [facility]) and it was confirmed that there should be a bed open on day shift for patient to be admitted to. Writer will have patients information printed and ready for day shift TTS to successfully transfer patient downstairs.

## 2020-07-17 NOTE — ED Notes (Signed)
VS will be taken when patient is awake. 

## 2020-07-17 NOTE — BH Assessment (Signed)
Patient has been accepted to [facility].  Patient assigned to [place] Accepting physician is Dr. Reece Levy.  Call report to [phone].  Representative was [employer].   ER Staff is aware of it:  Vaughan Basta, ER Secretary   Bed will be available tomorrow after 9am   Address: [address],  [place], [place] [address]

## 2020-07-17 NOTE — ED Notes (Signed)
Report provided to [place], RN in [place], pt to be moved.

## 2020-07-17 NOTE — ED Notes (Signed)
Pt became very agitated and began to point, yell, scream, and speak in spanish toward another pt who was doing same to staff. This nurse was with other pt but this pt could be heard and causing a reaction and escalation in situation for each pt. Other nurses and Dr Cheri Fowler who was in area placed and carried out orders for medication. PT informed other staff that he has PTSD and the situation he was watching due to being in hallway recliner was triggering him. Pt did calm quickly after receiving med.

## 2020-07-17 NOTE — ED Notes (Signed)
Hourly rounding completed at this time, patient currently awake in hallway bed. No complaints, stable, and in no acute distress. Q15 minute rounds and monitoring via Rover and Officer to continue. 

## 2020-07-17 NOTE — ED Notes (Signed)
Daughter and wife updated at this time on phone

## 2020-07-17 NOTE — ED Notes (Signed)
Hourly rounding completed at this time, patient currently awake in hallway recliner. No complaints, stable, and in no acute distress. Q15 minute rounds and monitoring via Rover and Officer to continue. 

## 2020-07-17 NOTE — ED Notes (Signed)
Given dinner tray

## 2020-07-17 NOTE — ED Notes (Signed)
IVC pending inpatient placement 

## 2020-07-17 NOTE — ED Notes (Signed)
Report received from Mary, RN including Situation, Background, Assessment, and Recommendations. Patient alert and oriented, warm and dry, and in no acute distress. Patient denies HI, AVH and pain. Patient made aware of Q15 minute rounds and Rover and Officer presence for their safety. Patient instructed to come to this nurse with needs or concerns. 

## 2020-07-17 NOTE — BH Assessment (Signed)
Referral information for Psychiatric Hospitalization faxed to;   . [facility] ([phone]-or- [phone]),   . [facility] [phone] -or- [phone]) [phone]  . [facility] ([phone]---[phone]---[phone]),  . [facility] ([phone], [phone], [phone] or [phone]),   . [facility] ([phone] or [phone])  . [facility] ([phone]),   . [facility] ([phone] -or- [phone]),   . [facility] ([phone] or [phone])  . [facility] ([phone] or [phone]),   . [facility] ([phone]). 

## 2020-07-17 NOTE — ED Provider Notes (Signed)
Emergency Medicine Observation Re-evaluation Note  Ivan Hamilton is a 54 y.o. male, seen on rounds today.  Pt initially presented to the ED for complaints of Abdominal Pain, Addiction Problem, Anxiety, Depression, Alcohol Problem, Suicidal, and Post-Traumatic Stress Disorder Currently, the patient is resting.  Physical Exam  BP 120/67   Pulse (!) 105   Temp 98.6 F (37 C)   Resp 16   Ht 5\' 5"  (1.651 m)   Wt 63 kg   SpO2 96%   BMI 23.11 kg/m  Physical Exam General: nontoxic Cardiac: well perfused Lungs: even and unlabored resp Psych: resting, currently calm  ED Course / MDM  EKG:    I have reviewed the labs performed to date as well as medications administered while in observation.  Recent changes in the last 24 hours include none.  Plan  Current plan is for psych eval.dispo. Patient is under full IVC at this time.   Merlyn Lot, MD 07/17/20 [phone]

## 2020-07-17 NOTE — ED Notes (Signed)
Spoke with [facility]

## 2020-07-18 DIAGNOSIS — G8929 Other chronic pain: Secondary | ICD-10-CM | POA: Diagnosis not present

## 2020-07-18 DIAGNOSIS — F329 Major depressive disorder, single episode, unspecified: Secondary | ICD-10-CM | POA: Diagnosis not present

## 2020-07-18 DIAGNOSIS — F331 Major depressive disorder, recurrent, moderate: Secondary | ICD-10-CM | POA: Diagnosis not present

## 2020-07-18 DIAGNOSIS — F333 Major depressive disorder, recurrent, severe with psychotic symptoms: Secondary | ICD-10-CM | POA: Diagnosis not present

## 2020-07-18 DIAGNOSIS — E039 Hypothyroidism, unspecified: Secondary | ICD-10-CM | POA: Diagnosis not present

## 2020-07-18 DIAGNOSIS — Z20822 Contact with and (suspected) exposure to covid-19: Secondary | ICD-10-CM | POA: Diagnosis not present

## 2020-07-18 DIAGNOSIS — K7460 Unspecified cirrhosis of liver: Secondary | ICD-10-CM | POA: Diagnosis not present

## 2020-07-18 DIAGNOSIS — F10239 Alcohol dependence with withdrawal, unspecified: Secondary | ICD-10-CM | POA: Diagnosis not present

## 2020-07-18 DIAGNOSIS — K219 Gastro-esophageal reflux disease without esophagitis: Secondary | ICD-10-CM | POA: Diagnosis not present

## 2020-07-18 DIAGNOSIS — F1020 Alcohol dependence, uncomplicated: Secondary | ICD-10-CM | POA: Diagnosis not present

## 2020-07-18 DIAGNOSIS — R109 Unspecified abdominal pain: Secondary | ICD-10-CM | POA: Diagnosis not present

## 2020-07-18 DIAGNOSIS — R45851 Suicidal ideations: Secondary | ICD-10-CM | POA: Diagnosis not present

## 2020-07-18 DIAGNOSIS — Z79899 Other long term (current) drug therapy: Secondary | ICD-10-CM | POA: Diagnosis not present

## 2020-07-18 DIAGNOSIS — I10 Essential (primary) hypertension: Secondary | ICD-10-CM | POA: Diagnosis not present

## 2020-07-18 DIAGNOSIS — M5136 Other intervertebral disc degeneration, lumbar region: Secondary | ICD-10-CM | POA: Diagnosis not present

## 2020-07-18 NOTE — ED Notes (Signed)
Pt discharged with law enforcement to [facility]; belongings returned to pt.

## 2020-07-18 NOTE — ED Notes (Signed)
Pt in shower.  

## 2020-07-18 NOTE — ED Notes (Signed)
Report to mary, rn.  

## 2020-08-23 DIAGNOSIS — F1010 Alcohol abuse, uncomplicated: Secondary | ICD-10-CM | POA: Diagnosis not present

## 2020-08-23 DIAGNOSIS — F329 Major depressive disorder, single episode, unspecified: Secondary | ICD-10-CM | POA: Diagnosis not present

## 2020-08-23 DIAGNOSIS — Z0000 Encounter for general adult medical examination without abnormal findings: Secondary | ICD-10-CM | POA: Diagnosis not present

## 2020-08-23 DIAGNOSIS — G4700 Insomnia, unspecified: Secondary | ICD-10-CM | POA: Diagnosis not present

## 2020-08-23 DIAGNOSIS — M5442 Lumbago with sciatica, left side: Secondary | ICD-10-CM | POA: Diagnosis not present

## 2020-08-30 ENCOUNTER — Other Ambulatory Visit (HOSPITAL_COMMUNITY): Payer: Self-pay | Admitting: Physician Assistant

## 2020-08-30 ENCOUNTER — Ambulatory Visit
Admission: RE | Admit: 2020-08-30 | Discharge: 2020-08-30 | Disposition: A | Discharge status: Home / Self Care | Payer: Medicare HMO | Source: Ambulatory Visit | Attending: Physician Assistant | Admitting: Physician Assistant

## 2020-08-30 ENCOUNTER — Other Ambulatory Visit: Payer: Self-pay

## 2020-08-30 ENCOUNTER — Other Ambulatory Visit: Payer: Self-pay | Admitting: Physician Assistant

## 2020-08-30 DIAGNOSIS — N433 Hydrocele, unspecified: Secondary | ICD-10-CM | POA: Diagnosis not present

## 2020-08-30 DIAGNOSIS — M5489 Other dorsalgia: Secondary | ICD-10-CM | POA: Diagnosis not present

## 2020-08-30 DIAGNOSIS — N50811 Right testicular pain: Secondary | ICD-10-CM

## 2020-08-30 DIAGNOSIS — R350 Frequency of micturition: Secondary | ICD-10-CM | POA: Diagnosis not present

## 2020-08-30 DIAGNOSIS — N50812 Left testicular pain: Secondary | ICD-10-CM | POA: Diagnosis not present

## 2020-08-30 DIAGNOSIS — N50819 Testicular pain, unspecified: Secondary | ICD-10-CM | POA: Diagnosis not present

## 2020-08-30 DIAGNOSIS — N5082 Scrotal pain: Secondary | ICD-10-CM | POA: Diagnosis not present

## 2020-09-14 DIAGNOSIS — H5213 Myopia, bilateral: Secondary | ICD-10-CM | POA: Diagnosis not present

## 2020-10-07 DIAGNOSIS — N4889 Other specified disorders of penis: Secondary | ICD-10-CM | POA: Diagnosis not present

## 2020-10-07 DIAGNOSIS — F1010 Alcohol abuse, uncomplicated: Secondary | ICD-10-CM | POA: Diagnosis not present

## 2020-10-07 DIAGNOSIS — F329 Major depressive disorder, single episode, unspecified: Secondary | ICD-10-CM | POA: Diagnosis not present

## 2020-11-03 DIAGNOSIS — F1124 Opioid dependence with opioid-induced mood disorder: Secondary | ICD-10-CM | POA: Diagnosis not present

## 2020-11-03 DIAGNOSIS — F1020 Alcohol dependence, uncomplicated: Secondary | ICD-10-CM | POA: Diagnosis not present

## 2020-11-03 DIAGNOSIS — Z79899 Other long term (current) drug therapy: Secondary | ICD-10-CM | POA: Diagnosis not present

## 2020-11-05 ENCOUNTER — Emergency Department
Admission: EM | Admit: 2020-11-05 | Discharge: 2020-11-07 | Disposition: A | Discharge status: Home / Self Care | Payer: Medicare HMO | Attending: Emergency Medicine | Admitting: Emergency Medicine

## 2020-11-05 ENCOUNTER — Other Ambulatory Visit: Payer: Self-pay

## 2020-11-05 DIAGNOSIS — F1020 Alcohol dependence, uncomplicated: Secondary | ICD-10-CM | POA: Diagnosis not present

## 2020-11-05 DIAGNOSIS — R4689 Other symptoms and signs involving appearance and behavior: Secondary | ICD-10-CM

## 2020-11-05 DIAGNOSIS — F10129 Alcohol abuse with intoxication, unspecified: Secondary | ICD-10-CM | POA: Diagnosis not present

## 2020-11-05 DIAGNOSIS — R45851 Suicidal ideations: Secondary | ICD-10-CM | POA: Diagnosis not present

## 2020-11-05 DIAGNOSIS — F10920 Alcohol use, unspecified with intoxication, uncomplicated: Secondary | ICD-10-CM

## 2020-11-05 DIAGNOSIS — R456 Violent behavior: Secondary | ICD-10-CM | POA: Diagnosis not present

## 2020-11-05 DIAGNOSIS — F4310 Post-traumatic stress disorder, unspecified: Secondary | ICD-10-CM | POA: Insufficient documentation

## 2020-11-05 DIAGNOSIS — F10220 Alcohol dependence with intoxication, uncomplicated: Secondary | ICD-10-CM | POA: Diagnosis not present

## 2020-11-05 DIAGNOSIS — Z20822 Contact with and (suspected) exposure to covid-19: Secondary | ICD-10-CM | POA: Insufficient documentation

## 2020-11-05 DIAGNOSIS — Y908 Blood alcohol level of 240 mg/100 ml or more: Secondary | ICD-10-CM | POA: Insufficient documentation

## 2020-11-05 DIAGNOSIS — F1920 Other psychoactive substance dependence, uncomplicated: Secondary | ICD-10-CM | POA: Insufficient documentation

## 2020-11-05 DIAGNOSIS — E039 Hypothyroidism, unspecified: Secondary | ICD-10-CM | POA: Diagnosis not present

## 2020-11-05 DIAGNOSIS — F32A Depression, unspecified: Secondary | ICD-10-CM | POA: Insufficient documentation

## 2020-11-05 DIAGNOSIS — I10 Essential (primary) hypertension: Secondary | ICD-10-CM | POA: Insufficient documentation

## 2020-11-05 DIAGNOSIS — F4312 Post-traumatic stress disorder, chronic: Secondary | ICD-10-CM | POA: Diagnosis present

## 2020-11-05 LAB — COMPREHENSIVE METABOLIC PANEL
ALT: 24 U/L (ref 0–44)
AST: 36 U/L (ref 15–41)
Albumin: 4.9 g/dL (ref 3.5–5.0)
Alkaline Phosphatase: 57 U/L (ref 38–126)
Anion gap: 14 (ref 5–15)
BUN: 10 mg/dL (ref 6–20)
CO2: 22 mmol/L (ref 22–32)
Calcium: 9.3 mg/dL (ref 8.9–10.3)
Chloride: 106 mmol/L (ref 98–111)
Creatinine, Ser: 0.92 mg/dL (ref 0.61–1.24)
GFR, Estimated: >60 mL/min (ref >60)
Glucose, Bld: 101 mg/dL — ABNORMAL HIGH (ref 70–99)
Potassium: 3.4 mmol/L — ABNORMAL LOW (ref 3.5–5.1)
Sodium: 142 mmol/L (ref 135–145)
Total Bilirubin: 0.9 mg/dL (ref 0.3–1.2)
Total Protein: 9.3 g/dL — ABNORMAL HIGH (ref 6.5–8.1)

## 2020-11-05 LAB — CBC
HCT: 45.1 % (ref 39.0–52.0)
Hemoglobin: 15.2 g/dL (ref 13.0–17.0)
MCH: 28.1 pg (ref 26.0–34.0)
MCHC: 33.7 g/dL (ref 30.0–36.0)
MCV: 83.4 fL (ref 80.0–100.0)
Platelets: 258 10*3/uL (ref 150–400)
RBC: 5.41 MIL/uL (ref 4.22–5.81)
RDW: 17.8 % — ABNORMAL HIGH (ref 11.5–15.5)
WBC: 7.4 10*3/uL (ref 4.0–10.5)
nRBC: 0 % (ref 0.0–0.2)

## 2020-11-05 LAB — SALICYLATE LEVEL: Salicylate Lvl: <7 mg/dL — ABNORMAL LOW (ref 7.0–30.0)

## 2020-11-05 LAB — ACETAMINOPHEN LEVEL: Acetaminophen (Tylenol), Serum: <10 ug/mL — ABNORMAL LOW (ref 10–30)

## 2020-11-05 LAB — ETHANOL: Alcohol, Ethyl (B): 322 mg/dL (ref <10)

## 2020-11-05 NOTE — ED Triage Notes (Signed)
Pt presents to ER with [employer].  Pt reports suicidal ideations today.  Pt states "I don't want to kill myself, but I do."  Pt has hx of PTSD, and had plans to kill himself using a knife today.  Pt tearful in triage and thinks he is crazy for having these thoughts.  Pt has also been indulging in alcohol more than normal recently.  Pt A&O x4, denies HI.

## 2020-11-05 NOTE — ED Notes (Signed)
Pt belongings: Black sweater  Tan pants [employer] belt  [facility]

## 2020-11-06 DIAGNOSIS — F1020 Alcohol dependence, uncomplicated: Secondary | ICD-10-CM | POA: Diagnosis not present

## 2020-11-06 LAB — RESP PANEL BY RT-PCR (FLU A&B, COVID) ARPGX2
Influenza A by PCR: NEGATIVE
Influenza B by PCR: NEGATIVE
SARS Coronavirus 2 by RT PCR: NEGATIVE

## 2020-11-06 LAB — URINE DRUG SCREEN, QUALITATIVE (ARMC ONLY)
Amphetamines, Ur Screen: NOT DETECTED
Barbiturates, Ur Screen: NOT DETECTED
Benzodiazepine, Ur Scrn: NOT DETECTED
Cannabinoid 50 Ng, Ur ~~LOC~~: NOT DETECTED
Cocaine Metabolite,Ur ~~LOC~~: NOT DETECTED
MDMA (Ecstasy)Ur Screen: NOT DETECTED
Methadone Scn, Ur: NOT DETECTED
Opiate, Ur Screen: NOT DETECTED
Phencyclidine (PCP) Ur S: NOT DETECTED
Tricyclic, Ur Screen: NOT DETECTED

## 2020-11-06 LAB — ETHANOL: Alcohol, Ethyl (B): <10 mg/dL (ref <10)

## 2020-11-06 MED ORDER — FLUOXETINE HCL 20 MG PO CAPS
80.0000 mg | ORAL_CAPSULE | Freq: Every day | ORAL | Status: DC
  Administered 2020-11-06 – 2020-11-07 (×2): 80 mg via ORAL
  Filled 2020-11-06 (×2): qty 4

## 2020-11-06 MED ORDER — LORAZEPAM 2 MG PO TABS: 0.0000 mg | ORAL_TABLET | Freq: Two times a day (BID) | ORAL | Status: DC

## 2020-11-06 MED ORDER — ZIPRASIDONE MESYLATE 20 MG IM SOLR
20.0000 mg | Freq: Once | INTRAMUSCULAR | Status: AC
  Administered 2020-11-06: 20 mg via INTRAMUSCULAR

## 2020-11-06 MED ORDER — HYDROXYZINE HCL 25 MG PO TABS
25.0000 mg | ORAL_TABLET | Freq: Three times a day (TID) | ORAL | Status: DC | PRN
Start: 2020-11-06 — End: 2020-11-07

## 2020-11-06 MED ORDER — QUETIAPINE FUMARATE 200 MG PO TABS
200.0000 mg | ORAL_TABLET | Freq: Every day | ORAL | Status: DC
  Administered 2020-11-07: 200 mg via ORAL
  Filled 2020-11-06: qty 1

## 2020-11-06 MED ORDER — TRAZODONE HCL 100 MG PO TABS
100.0000 mg | ORAL_TABLET | Freq: Every evening | ORAL | Status: DC | PRN
Start: 2020-11-06 — End: 2020-11-07
  Administered 2020-11-07: 100 mg via ORAL
  Filled 2020-11-06: qty 1

## 2020-11-06 MED ORDER — THIAMINE HCL 100 MG PO TABS
100.0000 mg | ORAL_TABLET | Freq: Every day | ORAL | Status: DC
  Administered 2020-11-06 – 2020-11-07 (×2): 100 mg via ORAL
  Filled 2020-11-06 (×2): qty 1

## 2020-11-06 MED ORDER — LORAZEPAM 2 MG/ML IJ SOLN
0.0000 mg | Freq: Two times a day (BID) | INTRAMUSCULAR | Status: DC
Start: 2020-11-08 — End: 2020-11-07

## 2020-11-06 MED ORDER — LORAZEPAM 2 MG/ML IJ SOLN
0.0000 mg | Freq: Four times a day (QID) | INTRAMUSCULAR | Status: DC
Start: 2020-11-06 — End: 2020-11-07

## 2020-11-06 MED ORDER — GABAPENTIN 600 MG PO TABS
600.0000 mg | ORAL_TABLET | Freq: Three times a day (TID) | ORAL | Status: DC
  Administered 2020-11-06 – 2020-11-07 (×4): 600 mg via ORAL
  Filled 2020-11-06 (×4): qty 1

## 2020-11-06 MED ORDER — TEMAZEPAM 7.5 MG PO CAPS
15.0000 mg | ORAL_CAPSULE | Freq: Every day | ORAL | Status: DC
  Administered 2020-11-07: 15 mg via ORAL
  Filled 2020-11-06: qty 2

## 2020-11-06 MED ORDER — LORAZEPAM 2 MG PO TABS
0.0000 mg | ORAL_TABLET | Freq: Four times a day (QID) | ORAL | Status: DC
  Administered 2020-11-07: 1 mg via ORAL
  Filled 2020-11-06: qty 1

## 2020-11-06 MED ORDER — PRAZOSIN HCL 1 MG PO CAPS
1.0000 mg | ORAL_CAPSULE | Freq: Every day | ORAL | Status: DC
  Administered 2020-11-07: 1 mg via ORAL
  Filled 2020-11-06 (×2): qty 1

## 2020-11-06 MED ORDER — THIAMINE HCL 100 MG/ML IJ SOLN: 100.0000 mg | Freq: Every day | INTRAMUSCULAR | Status: DC

## 2020-11-06 MED ORDER — LEVOTHYROXINE SODIUM 75 MCG PO TABS
125.0000 ug | ORAL_TABLET | Freq: Every day | ORAL | Status: DC
  Administered 2020-11-06 – 2020-11-07 (×2): 125 ug via ORAL
  Filled 2020-11-06: qty 5
  Filled 2020-11-06: qty 3

## 2020-11-06 MED ORDER — PRAZOSIN HCL 1 MG PO CAPS
1.0000 mg | ORAL_CAPSULE | Freq: Every day | ORAL | Status: DC
Start: 2020-11-06 — End: 2020-11-06

## 2020-11-06 MED ORDER — PANTOPRAZOLE SODIUM 40 MG PO TBEC
40.0000 mg | DELAYED_RELEASE_TABLET | Freq: Every day | ORAL | Status: DC
  Administered 2020-11-06 – 2020-11-07 (×2): 40 mg via ORAL
  Filled 2020-11-06 (×2): qty 1

## 2020-11-06 NOTE — ED Notes (Signed)
Pt yelling at staff, becoming very agitated. Pt cursing, refuses to follow commands and is making threats to staff.

## 2020-11-06 NOTE — ED Notes (Signed)
IVC per Dr. Weber Cooks inpt psych

## 2020-11-06 NOTE — BH Assessment (Signed)
Patient is unable to participate in assessment due to being medicated for aggressive / hostile behaviors towards satff.

## 2020-11-06 NOTE — BH Assessment (Addendum)
Patient is to be admitted to [facility] by Dr. Weber Cooks.  Attending Physician will be Dr. Domingo Cocking.   Patient has been assigned to room 319, by [place] staff is aware of the admission:  Jonelle Sidle, ER Secretary    Dr. Cheri Fowler, ER MD   Janett Billow, Patient's Nurse   Call for report and for arrival time after 8pm.

## 2020-11-06 NOTE — BH Assessment (Signed)
This writer attempted to assess pt along with Rashaun D., NP; however, pt was unable to participate in assessment due to being medicated. TTS to follow up.

## 2020-11-06 NOTE — Consult Note (Signed)
[facility] Consult   Reason for Consult: Consult for 55 year old man well-known to the hospital who was brought in intoxicated with suicidal statements Referring Physician: Shelbie Ammons Patient Identification: Ivan Hamilton MRN:  [id number] Principal Diagnosis: Alcohol use disorder, severe, dependence ([place]) Diagnosis:  Principal Problem:   Alcohol use disorder, severe, dependence ([place]) Active Problems:   PTSD (post-traumatic stress disorder)   Hypothyroid   Total Time spent with patient: 1 hour  Subjective:   Ivan Hamilton is a 56 y.o. male patient admitted with "the same thing, drinking".  HPI: Patient seen chart reviewed.  Patient known from prior encounters.  55 year old man with longstanding severe alcohol abuse and posttraumatic stress disorder.  Patient says he had been doing reasonably well staying sober for a couple months.  This last Thursday he even went to a local mental health agency trying to get into regular therapy and treatment.  He says that yesterday however he went to this store and bought a bottle of tequila and pretty much drank all of it.  He has no excuse or reason for it.  Just did it.  Not using any other drugs.  On presentation to the ER he was agitated and hostile and threatening and made suicidal statements.  Currently he is sobering up he is calm denies acute suicidal ideation but says that if he goes home he may be at risk for continued problem behavior.  Currently not shaky no sign of delirium  Past Psychiatric History: Longstanding history of PTSD and alcohol abuse.  Has had short periods of sobriety but does not really follow up very well with outpatient treatment.  Does have a history of many suicidal threats and some self injury  Risk to Self:   Risk to Others:   Prior Inpatient Therapy:   Prior Outpatient Therapy:    Past Medical History:  Past Medical History:  Diagnosis Date  . Alcohol withdrawal ([place])   . B12 deficiency   .  Chronic pain   . CVA (cerebral vascular accident) ([place])   . Hypertension   . Hypothyroidism   . Varicose veins     Past Surgical History:  Procedure Laterality Date  . CHOLECYSTECTOMY    . ESOPHAGOGASTRODUODENOSCOPY (EGD) WITH PROPOFOL N/A 10/24/2017   Procedure: ESOPHAGOGASTRODUODENOSCOPY (EGD) WITH PROPOFOL;  Surgeon: [name], MD;  [facility];  Service: Endoscopy;  Laterality: N/A;  . ESOPHAGOGASTRODUODENOSCOPY (EGD) WITH PROPOFOL N/A 07/12/2019   Procedure: ESOPHAGOGASTRODUODENOSCOPY (EGD) WITH PROPOFOL;  Surgeon: Jonathon Bellows, MD;  Location: [facility] ENDOSCOPY;  Service: Gastroenterology;  Laterality: N/A;  . LEG SURGERY     Family History: History reviewed. No pertinent family history. Family Psychiatric  History: Alcohol positive in family Social History:  Social History   Substance and Sexual Activity  Alcohol Use Yes   Comment: daily- 1 pint of vodka     Social History   Substance and Sexual Activity  Drug Use No    Social History   Socioeconomic History  . Marital status: Married    Spouse name: Not on file  . Number of children: Not on file  . Years of education: Not on file  . Highest education level: Not on file  Occupational History  . Not on file  Tobacco Use  . Smoking status: Never Smoker  . Smokeless tobacco: Never Used  Vaping Use  . Vaping Use: Never used  Substance and Sexual Activity  . Alcohol use: Yes    Comment: daily- 1 pint of vodka  . Drug  use: No  . Sexual activity: Not on file  Other Topics Concern  . Not on file  Social History Narrative  . Not on file   Social Determinants of Health   Financial Resource Strain: Not on file  Food Insecurity: Not on file  Transportation Needs: Not on file  Physical Activity: Not on file  Stress: Not on file  Social Connections: Not on file   Additional Social History:    Allergies:  No Known Allergies  Labs:  Results for orders placed or performed during the hospital  encounter of 11/05/20 (from the past 48 hour(s))  Comprehensive metabolic panel     Status: Abnormal   Collection Time: 11/05/20  9:56 PM  Result Value Ref Range   Sodium 142 135 - 145 mmol/L   Potassium 3.4 (L) 3.5 - 5.1 mmol/L   Chloride 106 98 - 111 mmol/L   CO2 22 22 - 32 mmol/L   Glucose, Bld 101 (H) 70 - 99 mg/dL    Comment: Glucose reference range applies only to samples taken after fasting for at least 8 hours.   BUN 10 6 - 20 mg/dL   Creatinine, Ser 0.92 0.61 - 1.24 mg/dL   Calcium 9.3 8.9 - 10.3 mg/dL   Total Protein 9.3 (H) 6.5 - 8.1 g/dL   Albumin 4.9 3.5 - 5.0 g/dL   AST 36 15 - 41 U/L   ALT 24 0 - 44 U/L   Alkaline Phosphatase 57 38 - 126 U/L   Total Bilirubin 0.9 0.3 - 1.2 mg/dL   GFR, Estimated >60 >60 mL/min    Comment: (NOTE) Calculated using the CKD-EPI Creatinine Equation (2021)    Anion gap 14 5 - 15    Comment: Performed at [facility], [address] [place], [place] [address]  Ethanol     Status: Abnormal   Collection Time: 11/05/20  9:56 PM  Result Value Ref Range   Alcohol, Ethyl (B) 322 (HH) <10 mg/dL    Comment: CRITICAL RESULT CALLED TO, READ BACK BY AND VERIFIED WITH  AUSTIN REEVES 11/05/20 2242 ADL (NOTE) Lowest detectable limit for serum alcohol is 10 mg/dL.  For medical purposes only. Performed at [facility], [address] [place], [place] [address]   Salicylate level     Status: Abnormal   Collection Time: 11/05/20  9:56 PM  Result Value Ref Range   Salicylate Lvl <2.5 (L) 7.0 - 30.0 mg/dL    Comment: Performed at [facility], [address] [place], [place] [address]  Acetaminophen level     Status: Abnormal   Collection Time: 11/05/20  9:56 PM  Result Value Ref Range   Acetaminophen (Tylenol), Serum <10 (L) 10 - 30 ug/mL    Comment: (NOTE) Therapeutic concentrations vary significantly. A range of 10-30 ug/mL  may be an effective concentration for many patients. However, some  are best treated  at concentrations outside of this range. Acetaminophen concentrations >150 ug/mL at 4 hours after ingestion  and >50 ug/mL at 12 hours after ingestion are often associated with  toxic reactions.  Performed at [facility], [address] [place], [place] [address]   cbc     Status: Abnormal   Collection Time: 11/05/20  9:56 PM  Result Value Ref Range   WBC 7.4 4.0 - 10.5 K/uL   RBC 5.41 4.22 - 5.81 MIL/uL   Hemoglobin 15.2 13.0 - 17.0 g/dL   HCT 45.1 39.0 - 52.0 %   MCV  83.4 80.0 - 100.0 fL   MCH 28.1 26.0 - 34.0 pg   MCHC 33.7 30.0 - 36.0 g/dL   RDW 17.8 (H) 11.5 - 15.5 %   Platelets 258 150 - 400 K/uL   nRBC 0.0 0.0 - 0.2 %    Comment: Performed at [facility], [address], [place], [place] [address]    Current Facility-Administered Medications  Medication Dose Route Frequency Provider Last Rate Last Admin  . FLUoxetine (PROZAC) capsule 80 mg  80 mg Oral Daily Ward, Kristen N, DO   80 mg at 11/06/20 0921  . gabapentin (NEURONTIN) tablet 600 mg  600 mg Oral TID Ward, Kristen N, DO   600 mg at 11/06/20 9892  . hydrOXYzine (ATARAX/VISTARIL) tablet 25 mg  25 mg Oral Q8H PRN Ward, Delice Bison, DO      . levothyroxine (SYNTHROID) tablet 125 mcg  125 mcg Oral Q0600 Ward, Kristen N, DO   125 mcg at 11/06/20 1194  . LORazepam (ATIVAN) injection 0-4 mg  0-4 mg Intravenous Q6H Ward, Kristen N, DO       Or  . LORazepam (ATIVAN) tablet 0-4 mg  0-4 mg Oral Q6H Ward, Kristen N, DO      . [START ON 11/08/2020] LORazepam (ATIVAN) injection 0-4 mg  0-4 mg Intravenous Q12H Ward, Kristen N, DO       Or  . Derrill Memo ON 11/08/2020] LORazepam (ATIVAN) tablet 0-4 mg  0-4 mg Oral Q12H Ward, Kristen N, DO      . pantoprazole (PROTONIX) EC tablet 40 mg  40 mg Oral Q1200 Ward, Kristen N, DO   40 mg at 11/06/20 1155  . prazosin (MINIPRESS) capsule 1 mg  1 mg Oral QHS Ward, Kristen N, DO      . QUEtiapine (SEROQUEL) tablet 200 mg  200 mg Oral QHS Ward, Kristen N, DO      . temazepam  (RESTORIL) capsule 15 mg  15 mg Oral QHS Ward, Kristen N, DO      . thiamine tablet 100 mg  100 mg Oral Daily Ward, Kristen N, DO   100 mg at 11/06/20 1740   Or  . thiamine (B-1) injection 100 mg  100 mg Intravenous Daily Ward, Kristen N, DO      . traZODone (DESYREL) tablet 100 mg  100 mg Oral QHS PRN Ward, Delice Bison, DO       Current Outpatient Medications  Medication Sig Dispense Refill  . FLUoxetine (PROZAC) 40 MG capsule Take 2 capsules (80 mg total) by mouth daily. 60 capsule 1  . gabapentin (NEURONTIN) 600 MG tablet Take 1 tablet (600 mg total) by mouth 3 (three) times daily. 30 tablet 1  . hydrOXYzine (ATARAX/VISTARIL) 25 MG tablet Take 1 tablet (25 mg total) by mouth every 8 (eight) hours as needed for itching. 90 tablet 1  . levothyroxine (SYNTHROID) 125 MCG tablet Take 1 tablet (125 mcg total) by mouth daily at 6 (six) AM. 30 tablet 1  . pantoprazole (PROTONIX) 40 MG tablet Take 1 tablet (40 mg total) by mouth daily at 12 noon. 30 tablet 1  . prazosin (MINIPRESS) 1 MG capsule Take 1 capsule (1 mg total) by mouth at bedtime. 30 capsule 1  . QUEtiapine (SEROQUEL) 200 MG tablet Take 1 tablet (200 mg total) by mouth at bedtime. 30 tablet 1  . temazepam (RESTORIL) 15 MG capsule Take 1 capsule (15 mg total) by mouth at bedtime. 30 capsule 1  . traZODone (DESYREL) 50 MG tablet  Take 2 tablets (100 mg total) by mouth at bedtime as needed for sleep. 60 tablet 1    Musculoskeletal: Strength & Muscle Tone: within normal limits Gait & Station: normal Patient leans: N/A            Psychiatric Specialty Exam:  Presentation  General Appearance: No data recorded Eye Contact:No data recorded Speech:No data recorded Speech Volume:No data recorded Handedness:No data recorded  Mood and Affect  Mood:No data recorded Affect:No data recorded  Thought Process  Thought Processes:No data recorded Descriptions of Associations:No data recorded Orientation:No data recorded Thought  Content:No data recorded History of Schizophrenia/Schizoaffective disorder:No  Duration of Psychotic Symptoms:No data recorded Hallucinations:No data recorded Ideas of Reference:No data recorded Suicidal Thoughts:No data recorded Homicidal Thoughts:No data recorded  Sensorium  Memory:No data recorded Judgment:No data recorded Insight:No data recorded  Executive Functions  Concentration:No data recorded Attention Span:No data recorded Recall:No data recorded Fund of Knowledge:No data recorded Language:No data recorded  Psychomotor Activity  Psychomotor Activity:No data recorded  Assets  Assets:No data recorded  Sleep  Sleep:No data recorded  Physical Exam: Physical Exam Vitals and nursing note reviewed.  Constitutional:      Appearance: Normal appearance.  HENT:     Head: Normocephalic and atraumatic.     Mouth/Throat:     Pharynx: Oropharynx is clear.  Eyes:     Pupils: Pupils are equal, round, and reactive to light.  Cardiovascular:     Rate and Rhythm: Normal rate and regular rhythm.  Pulmonary:     Effort: Pulmonary effort is normal.     Breath sounds: Normal breath sounds.  Abdominal:     General: Abdomen is flat.     Palpations: Abdomen is soft.  Musculoskeletal:        General: Normal range of motion.  Skin:    General: Skin is warm and dry.  Neurological:     General: No focal deficit present.     Mental Status: He is alert. Mental status is at baseline.  Psychiatric:        Attention and Perception: Attention normal.        Mood and Affect: Mood is anxious.        Speech: Speech normal.        Behavior: Behavior normal.        Thought Content: Thought content includes suicidal ideation. Thought content does not include suicidal plan.        Cognition and Memory: Memory is impaired.    Review of Systems  Constitutional: Negative.   HENT: Negative.   Eyes: Negative.   Respiratory: Negative.   Cardiovascular: Negative.   Gastrointestinal:  Negative.   Musculoskeletal: Negative.   Skin: Negative.   Neurological: Negative.   Psychiatric/Behavioral: Positive for hallucinations and substance abuse.   Blood pressure (!) 155/97, pulse 83, temperature 98.7 F (37.1 C), temperature source Oral, resp. rate 18, SpO2 97 %. There is no height or weight on file to calculate BMI.  Treatment Plan Summary: Medication management and Plan 56 year old man with alcohol abuse depression and PTSD.  Suicidal ideation when drinking.  Patient says he very much wants to go to "rehab".  Feels out of control again.  At this point although he is sobering up I will not recommend discharge from the ER.  We can check whether he would be appropriate for RTS or other services although I expect his insurance may rule that out.  TTS can speak with him and we can look into whether inpatient  substance abuse treatment is available.  Reassess tomorrow and if nothing else is available see whether inpatient psych treatment is appropriate.  Continue current medicines including detox meds.  Disposition: Recommend psychiatric Inpatient admission when medically cleared. Supportive therapy provided about ongoing stressors.  Alethia Berthold, MD 11/06/2020 2:21 PM

## 2020-11-06 NOTE — ED Notes (Signed)
Pt asleep at this time-CIWA 0.

## 2020-11-06 NOTE — ED Notes (Signed)
TTS at bedside. 

## 2020-11-06 NOTE — ED Notes (Signed)
Patient is sleeping and is not displaying any ETOH withdrawals. Will continue to monitor for the remainder of the shift.

## 2020-11-06 NOTE — ED Notes (Signed)
Patient sleeping in no acute distress.

## 2020-11-06 NOTE — BH Assessment (Addendum)
Comprehensive Clinical Assessment (CCA) Note  11/06/2020 Ivan Hamilton [id number]  Chief Complaint:  Chief Complaint  Patient presents with  . Suicidal    Patient is suicidal without a plan  . Addiction Problem  . Depression  . Post-Traumatic Stress Disorder    Patient is a 55 yo male who presents to [facility] ED involuntarily for treatment. Per triage note,  Pt presents to ER with BPD.  Pt reports suicidal ideations today.  Pt states "I don't want to kill myself, but I do."  Pt has hx of PTSD, and had plans to kill himself using a knife today.  Pt tearful in triage and thinks he is crazy for having these thoughts.  Pt has also been indulging in alcohol more than normal recently.  Pt A&O x4, denies HI.  During TTS assessment pt presents alert and oriented x 4, cooperative, tearful, and mood-congruent with affect. The pt does not appear to be responding to internal or external stimuli, however, patient reports having some auditory hallucinations. Neither is the pt presenting with any delusional thinking. Pt verified the information provided to triage RN.  Patient admits that he was brought in by the police department after drinking 1/5th of Tequila. Patient stated he might have said "I want to kill myself," However, patient stated that he doesn't remember much because of the amount of alcohol that he drank last night. Patient currently denies any SI or HI. Patient stated that sometimes he hears voices of people from the [place] and even has flashbacks. Patient also admits to seeing some shadows. Patient reported that he really wants to stop drinking and wants to also attend some AA meetings. Patient stated that he went to see a provider at [place] this past Thursday. Patient stated that his ex wife is currently living with him because he's not able to live alone. Patient stated "I really need help getting off alcohol".   Disposition: Per Dr. Weber Cooks, patient is recommend psychiatric Inpatient  admission when medically cleared. Supportive therapy provided about ongoing stressors.  Visit Diagnosis: Alcohol Use Disorder; PTSD    CCA Screening, Triage and Referral (STR)  Patient Reported Information How did you hear about [place]? Self  Referral name: No data recorded Referral phone number: No data recorded  Whom do you see for routine medical problems? -- (Unknown)  Practice/Facility Name: No data recorded Practice/Facility Phone Number: No data recorded Name of Contact: No data recorded Contact Number: No data recorded Contact Fax Number: No data recordedPrescriber Name: No data recorded Prescriber Address (if known): No data recorded  What Is the Reason for Your Visit/Call Today? Patient presented to the ED with suicidal ideations without a plan and alcohol use.  How Long Has This Been Causing You Problems? > than 6 months  What Do You Feel Would Help You the Most Today? Alcohol or Drug Use Treatment; Treatment for Depression or other mood problem   Have You Recently Been in Any Inpatient Treatment (Hospital/Detox/Crisis Center/28-Day Program)? Yes  Name/Location of Program/Hospital:[facility]  How Long Were You There? 7 days  When Were You Discharged? 05/18/2020   Have You Ever Received Services From [employer] Before? Yes  Who Do You See at [facility]? [facility]   Have You Recently Had Any Thoughts About Hurting Yourself? Yes  Are You Planning to Commit Suicide/Harm Yourself At This time? No   Have you Recently Had Thoughts About [place]? No  Explanation: No data recorded  Have You  Used Any Alcohol or Drugs in the Past 24 Hours? Yes  How Long Ago Did You Use Drugs or Alcohol? 2300  What Did You Use and How Much? Alcohol 1/5th   Do You Currently Have a Therapist/Psychiatrist? Yes  Name of Therapist/Psychiatrist: [place] Recently Discharged From Any Office Practice or Programs?  No  Explanation of Discharge From Practice/Program: No data recorded    CCA Screening Triage Referral Assessment Type of Contact: Face-to-Face  Is this Initial or Reassessment? No data recorded Date Telepsych consult ordered in [facility]:  No data recorded Time Telepsych consult ordered in [facility]:  No data recorded  Patient Reported Information Reviewed? Yes  Patient Left Without Being Seen? No data recorded Reason for Not Completing Assessment: No data recorded  Collateral Involvement: N/A   Does Patient Have a Court Appointed Legal Guardian? No data recorded Name and Contact of Legal Guardian: Self  If Minor and Not Living with Parent(s), Who has Custody? No data recorded Is CPS involved or ever been involved? Never  Is APS involved or ever been involved? Never   Patient Determined To Be At Risk for Harm To Self or Others Based on Review of Patient Reported Information or Presenting Complaint? Yes, for Self-Harm  Method: No data recorded Availability of Means: No data recorded Intent: No data recorded Notification Required: No data recorded Additional Information for Danger to Others Potential: No data recorded Additional Comments for Danger to Others Potential: No data recorded Are There Guns or Other Weapons in Your Home? No data recorded Types of Guns/Weapons: No data recorded Are These Weapons Safely Secured?                            No data recorded Who Could Verify You Are Able To Have These Secured: No data recorded Do You Have any Outstanding Charges, Pending Court Dates, Parole/Probation? No data recorded Contacted To Inform of Risk of Harm To Self or Others: No data recorded  Location of Assessment: [facility] ED   Does Patient Present under Involuntary Commitment? Yes  IVC Papers Initial File Date: 11/05/2020   [place] of Residence: [place]   Patient Currently Receiving the Following Services: Individual Therapy   Determination of Need: Emergent (2  hours)   Options For Referral: Inpatient Hospitalization     CCA Biopsychosocial Intake/Chief Complaint:  Patient presented to ER with suicidal ideations and alcohol intoxication  Current Symptoms/Problems: Patient reports that she he feels depressed and knowns that he drank too mcuch liquor last night   Patient Reported Schizophrenia/Schizoaffective Diagnosis in Past: No   Strengths: unknown  Preferences: unknown  Abilities: unknown   Type of Services Patient Feels are Needed: IP detox or treatment   Initial Clinical Notes/Concerns: No data recorded  Mental Health Symptoms Depression:  Change in energy/activity; Tearfulness; Worthlessness   Duration of Depressive symptoms: Greater than two weeks   Mania:  N/A   Anxiety:   Worrying; Restlessness   Psychosis:  Hallucinations   Duration of Psychotic symptoms: Less than six months   Trauma:  Difficulty staying/falling asleep; Emotional numbing; Re-experience of traumatic event   Obsessions:  Poor insight   Compulsions:  N/A   Inattention:  N/A   Hyperactivity/Impulsivity:  N/A   Oppositional/Defiant Behaviors:  N/A   Emotional Irregularity:  N/A   Other Mood/Personality Symptoms:  No data recorded   Mental Status Exam Appearance and self-care  Stature:  Average  Weight:  Average weight   Clothing:  Disheveled   Grooming:  Normal   Cosmetic use:  None   Posture/gait:  Normal   Motor activity:  Restless   Sensorium  Attention:  Normal   Concentration:  Normal   Orientation:  X5   Recall/memory:  Normal   Affect and Mood  Affect:  Anxious; Depressed; Tearful   Mood:  Depressed   Relating  Eye contact:  Normal   Facial expression:  Depressed   Attitude toward examiner:  Cooperative   Thought and Language  Speech flow: Slurred   Thought content:  Appropriate to Mood and Circumstances   Preoccupation:  Guilt   Hallucinations:  Auditory; Visual   Organization:  No data  recorded  [employer] of Knowledge:  Average   Intelligence:  Average   Abstraction:  Normal   Judgement:  Normal   Reality Testing:  Adequate   Insight:  Fair   Decision Making:  Normal   Social Functioning  Social Maturity:  Irresponsible; Impulsive   Social Judgement:  Naive   Stress  Stressors:  Grief/losses; Housing   Coping Ability:  Resilient   Skill Deficits:  Activities of daily living; Communication   Supports:  Family     Religion: Religion/Spirituality Are You A Religious Person?: No  Leisure/Recreation: Leisure / Recreation Do You Have Hobbies?: No  Exercise/Diet: Exercise/Diet Do You Exercise?: No Have You Gained or Lost A Significant Amount of Weight in the Past Six Months?: No Do You Follow a Special Diet?: No Do You Have Any Trouble Sleeping?: No   CCA Employment/Education Employment/Work Situation: Employment / Work Copywriter, advertising Employment situation:  (Unknown) 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)  Education: Education Is Patient Currently Attending School?: No Last Grade Completed:  (unknown) Did Teacher, adult education From [employer]?: Yes Did Physicist, medical?:  (unknown)   CCA Family/Childhood History Family and Relationship History: Family history Marital status: Married Number of Years Married:  (unknown) What types of issues is patient dealing with in the relationship?: Unable to assess Are you sexually active?:  (Unknown) Does patient have children?: Yes How many children?: 2 How is patient's relationship with their children?: unknown  Childhood History:     Child/Adolescent Assessment:     CCA Substance Use Alcohol/Drug Use: Alcohol / Drug Use Pain Medications: See PTA Prescriptions: See PTA Over the Counter: See PTA History of alcohol / drug use?: Yes Longest period of sobriety (when/how long): unknown Withdrawal Symptoms: Agitation,Tremors,Sweats Substance  #1 Name of Substance 1: Alcohol 1 - Age of First Use: unknown 1 - Amount (size/oz): 1/5th of tequila 1 - Frequency: "patient reports he drinks every Saturday & Sunday" 1 - Duration: "patient reports he drinks every Saturday & Sunday for the past 2 months" - per medical records pt has a long standing history of drinking alcohol 1 - Last Use / Amount: yesterday 1/5th of tequila 1 - Method of Aquiring: Purchased 1- Route of Use: Oral                       ASAM's:  Six Dimensions of Multidimensional Assessment  Dimension 1:  Acute Intoxication and/or Withdrawal Potential:   Dimension 1:  Description of individual's past and current experiences of substance use and withdrawal: Patient has had past medical admission due to withdrawals  Dimension 2:  Biomedical Conditions and Complications:   Dimension 2:  Description of patient's biomedical conditions and  complications: Patient has medical concerns where his drinking interferes  Dimension 3:  Emotional, Behavioral, or Cognitive Conditions and Complications:  Dimension 3:  Description of emotional, behavioral, or cognitive conditions and complications: Due to patient's history of PTSD  Dimension 4:  Readiness to Change:  Dimension 4:  Description of Readiness to Change criteria: Patient is willing to go into treatment without family pressuring him to  Dimension 5:  Relapse, Continued use, or Continued Problem Potential:  Dimension 5:  Relapse, continued use, or continued problem potential critiera description: Chronic history of relapse  Dimension 6:  Recovery/Living Environment:  Dimension 6:  Recovery/Iiving environment criteria description: Family is supportive and doesn't want pt to use but he continues to have access to it  ASAM Severity Score: ASAM's Severity Rating Score: 12  ASAM Recommended Level of Treatment: ASAM Recommended Level of Treatment: Level II Intensive Outpatient Treatment   Substance use Disorder (SUD) Substance  Use Disorder (SUD)  Checklist Symptoms of Substance Use: Continued use despite having a persistent/recurrent physical/psychological problem caused/exacerbated by use,Evidence of withdrawal (Comment),Evidence of tolerance,Persistent desire or unsuccessful efforts to cut down or control use,Presence of craving or strong urge to use,Substance(s) often taken in larger amounts or over longer times than was intended  Recommendations for Services/Supports/Treatments: Recommendations for Services/Supports/Treatments Recommendations For Services/Supports/Treatments: Inpatient Hospitalization  DSM5 Diagnoses: Patient Active Problem List   Diagnosis Date Noted  . RUQ abdominal pain 05/17/2020  . Lip swelling   . Hypotension 01/10/2020  . Metabolic acidosis   . Hepatic cirrhosis ([place])   . Diverticulitis   . Transaminitis   . Syncope 07/11/2019  . Abdominal pain 07/11/2019  . Alcohol abuse 07/11/2019  . Thrombocytopenia ([place]) 07/11/2019  . Normocytic anemia 07/11/2019  . Tobacco abuse 07/11/2019  . HTN (hypertension) 07/11/2019  . CVA (cerebral vascular accident) ([place])   . Major psychotic depression, recurrent ([place]) 06/01/2019  . Hyperbilirubinemia 12/23/2018  . Suicidal behavior 08/28/2018  . Severe recurrent major depressive disorder with psychotic features ([place]) 08/28/2018  . Alcohol intoxication ([place]) 08/28/2018  . S/P laparoscopic cholecystectomy 11/02/2017  . Duodenitis   . Gastritis without bleeding   . Hypokalemia 10/22/2017  . Intractable vomiting with nausea   . Anxiety and depression 04/10/2016  . Alcohol withdrawal delirium ([place])   . Alcohol withdrawal ([place]) 09/02/2015  . Alcohol use disorder, severe, dependence ([place]) 09/02/2015  . 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 PTSD (post-traumatic stress disorder) 09/01/2015  . Hypothyroid 09/01/2015  . Gastric reflux 09/01/2015  . DDD  (degenerative disc disease), lumbosacral 09/01/2015  . 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 Vitamin B12 deficiency 08/17/2015  . Pain of lumbar spine 05/19/2015  . Neuropathy 05/17/2015  . Varicose vein of leg 04/25/2015  . Abnormal LFTs (liver function tests) 03/31/2015  . Confusion state 03/31/2015  . Chronic pain of left lower extremity 03/31/2015    Patient Centered Plan: Patient is on the following Treatment Plan(s):  Post Traumatic Stress Disorder and Substance Abuse   Referrals to Alternative Service(s): Referred to Alternative Service(s):   Place:   Date:   Time:    Referred to Alternative Service(s):   Place:   Date:   Time:    Referred to Alternative Service(s):   Place:   Date:   Time:    Referred to Alternative Service(s):   Place:   Date:   Time:     Mathis Bud, MS, [facility]

## 2020-11-06 NOTE — ED Notes (Signed)
Pt does not want to shower

## 2020-11-06 NOTE — ED Notes (Signed)
IVC to go to [facility] after 7p

## 2020-11-06 NOTE — ED Notes (Signed)
Patient denies pain and is in no acute distress.

## 2020-11-06 NOTE — ED Notes (Signed)
Called [facility] for consult stated major delays  spoke to [place]

## 2020-11-06 NOTE — ED Notes (Signed)
Pt states that he feels better than last night. Remembers saying the things he did but denies them this morning.

## 2020-11-06 NOTE — ED Notes (Signed)
Cancelled SOC as Dr. Weber Cooks  did consult

## 2020-11-06 NOTE — ED Provider Notes (Signed)
[facility] Emergency Department Provider Note  ____________________________________________   Event Date/Time   First MD Initiated Contact with Patient 11/05/20 2300     (approximate)  I have reviewed the triage vital signs and the nursing notes.   HISTORY  Chief Complaint Suicidal    HPI Ivan Hamilton is a 55 y.o. male With history of hypertension, CVA, hypothyroidism, alcohol abuse who presents to the emergency department under IVC.  Patient reports that he is suicidal.  He is unable to tell me if he has a plan.  No HI or hallucinations.  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  Denies drug use.  Denies any pain.        Past Medical History:  Diagnosis Date  . Alcohol withdrawal ([place])   . B12 deficiency   . Chronic pain   . CVA (cerebral vascular accident) ([place])   . Hypertension   . Hypothyroidism   . Varicose veins     Patient Active Problem List   Diagnosis Date Noted  . RUQ abdominal pain 05/17/2020  . Lip swelling   . Hypotension 01/10/2020  . Metabolic acidosis   . Hepatic cirrhosis ([place])   . Diverticulitis   . Transaminitis   . Syncope 07/11/2019  . Abdominal pain 07/11/2019  . Alcohol abuse 07/11/2019  . Thrombocytopenia ([place]) 07/11/2019  . Normocytic anemia 07/11/2019  . Tobacco abuse 07/11/2019  . HTN (hypertension) 07/11/2019  . CVA (cerebral vascular accident) ([place])   . 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 Hyperbilirubinemia 12/23/2018  . Suicidal behavior 08/28/2018  . Severe recurrent major depressive disorder with psychotic features ([place]) 08/28/2018  . Alcohol intoxication ([place]) 08/28/2018  . S/P laparoscopic cholecystectomy 11/02/2017  . Duodenitis   . Gastritis without bleeding   . Hypokalemia 10/22/2017  . Intractable vomiting with nausea   . Anxiety and depression 04/10/2016  . Alcohol withdrawal delirium ([place])   . Alcohol withdrawal ([place]) 09/02/2015  . Alcohol use disorder,  severe, dependence ([place]) 09/02/2015  . 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 PTSD (post-traumatic stress disorder) 09/01/2015  . Hypothyroid 09/01/2015  . Gastric reflux 09/01/2015  . DDD (degenerative disc disease), lumbosacral 09/01/2015  . 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 Vitamin B12 deficiency 08/17/2015  . Pain of lumbar spine 05/19/2015  . Neuropathy 05/17/2015  . Varicose vein of leg 04/25/2015  . Abnormal LFTs (liver function tests) 03/31/2015  . Confusion state 03/31/2015  . Chronic pain of left lower extremity 03/31/2015    Past Surgical History:  Procedure Laterality Date  . CHOLECYSTECTOMY    . ESOPHAGOGASTRODUODENOSCOPY (EGD) WITH PROPOFOL N/A 10/24/2017   Procedure: ESOPHAGOGASTRODUODENOSCOPY (EGD) WITH PROPOFOL;  Surgeon: [name], MD;  Location: [facility] ENDOSCOPY;  Service: Endoscopy;  Laterality: N/A;  . ESOPHAGOGASTRODUODENOSCOPY (EGD) WITH PROPOFOL N/A 07/12/2019   Procedure: ESOPHAGOGASTRODUODENOSCOPY (EGD) WITH PROPOFOL;  Surgeon: Jonathon Bellows, MD;  Location: [facility] ENDOSCOPY;  Service: Gastroenterology;  Laterality: N/A;  . LEG SURGERY      Prior to Admission medications   Medication Sig Start Date End Date Taking? Authorizing Provider  FLUoxetine (PROZAC) 40 MG capsule Take 2 capsules (80 mg total) by mouth daily. 05/18/20   Salley Scarlet, MD  gabapentin (NEURONTIN) 600 MG tablet Take 1 tablet (600 mg total) by mouth 3 (three) times daily. 05/18/20   Salley Scarlet, MD  hydrOXYzine (ATARAX/VISTARIL) 25 MG tablet Take 1 tablet (25 mg total) by mouth every 8 (  eight) hours as needed for itching. 05/18/20   Salley Scarlet, MD  levothyroxine (SYNTHROID) 125 MCG tablet Take 1 tablet (125 mcg total) by mouth daily at 6 (six) AM. 05/19/20   Salley Scarlet, MD  pantoprazole (PROTONIX) 40 MG tablet Take 1 tablet (40 mg total) by mouth daily at 12 noon. 05/18/20   Salley Scarlet, MD  prazosin (MINIPRESS) 1 MG capsule Take 1 capsule (1 mg total) by mouth at bedtime. 05/18/20   Salley Scarlet, MD  QUEtiapine (SEROQUEL) 200 MG tablet Take 1 tablet (200 mg total) by mouth at bedtime. 05/18/20   Salley Scarlet, MD  temazepam (RESTORIL) 15 MG capsule Take 1 capsule (15 mg total) by mouth at bedtime. 05/18/20   Salley Scarlet, MD  traZODone (DESYREL) 50 MG tablet Take 2 tablets (100 mg total) by mouth at bedtime as needed for sleep. 05/18/20   Salley Scarlet, MD    Allergies Patient has no known allergies.  History reviewed. No pertinent family history.  Social History Social History   Tobacco Use  . Smoking status: Never Smoker  . Smokeless tobacco: Never Used  Vaping Use  . Vaping Use: Never used  Substance Use Topics  . Alcohol use: Yes    Comment: daily- 1 pint of vodka  . Drug use: No    Review of Systems Constitutional: No fever. Eyes: No visual changes. ENT: No sore throat. Cardiovascular: Denies chest pain. Respiratory: Denies shortness of breath. Gastrointestinal: No nausea, vomiting, diarrhea. Genitourinary: Negative for dysuria. Musculoskeletal: Negative for back pain. Skin: Negative for rash. Neurological: Negative for focal weakness or numbness.  ____________________________________________   PHYSICAL EXAM:  VITAL SIGNS: ED Triage Vitals  Enc Vitals Group     BP 11/05/20 2157 (!) 146/121     Pulse Rate 11/05/20 2157 100     Resp 11/05/20 2157 (!) 22     Temp 11/05/20 2157 98 F (36.7 C)     Temp Source 11/05/20 2157 Oral     SpO2 11/05/20 2157 100 %     Weight --      Height --      Head Circumference --      Peak Flow --      Pain Score 11/05/20 2154 0     Pain Loc --      Pain Edu? --      Excl. in [place]? --    CONSTITUTIONAL: Alert and oriented and responds appropriately to questions.  Intoxicated. HEAD: Normocephalic EYES: Conjunctivae clear, pupils appear equal, EOM appear intact ENT: normal nose; moist  mucous membranes NECK: Supple, normal ROM CARD: RRR; S1 and S2 appreciated; no murmurs, no clicks, no rubs, no gallops RESP: Normal chest excursion without splinting or tachypnea; breath sounds clear and equal bilaterally; no wheezes, no rhonchi, no rales, no hypoxia or respiratory distress, speaking full sentences ABD/GI: Normal bowel sounds; non-distended; soft, non-tender, no rebound, no guarding, no peritoneal signs, no hepatosplenomegaly BACK: The back appears normal EXT: Normal ROM in all joints; no deformity noted, no edema; no cyanosis SKIN: Normal color for age and race; warm; no rash on exposed skin NEURO: Moves all extremities equally PSYCH: [place] is SI without plan.  No HI or hallucinations.  Poor eye contact.  Flat affect.  ____________________________________________   LABS (all labs ordered are listed, but only abnormal results are displayed)  Labs Reviewed  COMPREHENSIVE METABOLIC PANEL - Abnormal; Notable for the following components:      Result  Value   Potassium 3.4 (*)    Glucose, Bld 101 (*)    Total Protein 9.3 (*)    All other components within normal limits  ETHANOL - Abnormal; Notable for the following components:   Alcohol, Ethyl (B) 322 (*)    All other components within normal limits  SALICYLATE LEVEL - Abnormal; Notable for the following components:   Salicylate Lvl <5.1 (*)    All other components within normal limits  ACETAMINOPHEN LEVEL - Abnormal; Notable for the following components:   Acetaminophen (Tylenol), Serum <10 (*)    All other components within normal limits  CBC - Abnormal; Notable for the following components:   RDW 17.8 (*)    All other components within normal limits  RESP PANEL BY RT-PCR (FLU A&B, COVID) ARPGX2  URINE DRUG SCREEN, QUALITATIVE ([facility] ONLY)   ____________________________________________  EKG  none ____________________________________________  RADIOLOGY I, Jarom Govan, personally viewed and evaluated these  images (plain radiographs) as part of my medical decision making, as well as reviewing the written report by the radiologist.  ED MD interpretation:  none  Official radiology report(s): No results found.  ____________________________________________   PROCEDURES  Procedure(s) performed (including Critical Care):  Procedures  CRITICAL CARE Performed by: Pryor Curia   Total critical care time: 45 minutes  Critical care time was exclusive of separately billable procedures and treating other patients.  Critical care was necessary to treat or prevent imminent or life-threatening deterioration.  Critical care was time spent personally by me on the following activities: development of treatment plan with patient and/or surrogate as well as nursing, discussions with consultants, evaluation of patient's response to treatment, examination of patient, obtaining history from patient or surrogate, ordering and performing treatments and interventions, ordering and review of laboratory studies, ordering and review of radiographic studies, pulse oximetry and re-evaluation of patient's condition.  ____________________________________________   INITIAL IMPRESSION / ASSESSMENT AND PLAN / ED COURSE  As part of my medical decision making, I reviewed the following data within the [facility] notes reviewed and incorporated, Labs reviewed , Old chart reviewed and Notes from prior ED visits         Patient here with suicidal thoughts.  Under IVC.  Will obtain screening labs, urine and consult TTS and psychiatry.  ED PROGRESS  Patient became increasingly agitated, cursing, yelling and unable to be redirected.  He agreed to take IM Geodon to help with his agitation.  Security at bedside.  Patient punching walls repeatedly.   3:20 AM  Pt now resting comfortably.  Patient under full IVC and awaiting psychiatric evaluation for  disposition. ____________________________________________   FINAL CLINICAL IMPRESSION(S) / ED DIAGNOSES  Final diagnoses:  Suicidal ideation  Aggressive behavior  Alcoholic intoxication without complication [facility])     ED Discharge Orders    None      *Please note:  Hamdi Kley was evaluated in Emergency Department on 11/06/2020 for the symptoms described in the history of present illness. He was evaluated in the context of the global COVID-19 pandemic, which necessitated consideration that the patient might be at risk for infection with the SARS-CoV-2 virus that causes COVID-19. Institutional protocols and algorithms that pertain to the evaluation of patients at risk for COVID-19 are in a state of rapid change based on information released by regulatory bodies including the CDC and federal and state organizations. These policies and algorithms were followed during the patient's care in the ED.  Some ED evaluations and interventions  may be delayed as a result of limited staffing during and the pandemic.*   Note:  This document was prepared using Dragon voice recognition software and may include unintentional dictation errors.   Tresea Heine, Delice Bison, DO 11/06/20 [phone]

## 2020-11-07 ENCOUNTER — Inpatient Hospital Stay
Admission: RE | Admit: 2020-11-07 | Discharge: 2020-11-11 | DRG: 885 | Payer: Medicare HMO | Source: Intra-hospital | Attending: Behavioral Health | Admitting: Behavioral Health

## 2020-11-07 ENCOUNTER — Encounter: Payer: Self-pay | Admitting: Psychiatry

## 2020-11-07 ENCOUNTER — Other Ambulatory Visit: Payer: Self-pay

## 2020-11-07 DIAGNOSIS — R456 Violent behavior: Secondary | ICD-10-CM | POA: Diagnosis not present

## 2020-11-07 DIAGNOSIS — F1020 Alcohol dependence, uncomplicated: Secondary | ICD-10-CM | POA: Diagnosis not present

## 2020-11-07 DIAGNOSIS — I10 Essential (primary) hypertension: Secondary | ICD-10-CM | POA: Diagnosis present

## 2020-11-07 DIAGNOSIS — G4700 Insomnia, unspecified: Secondary | ICD-10-CM | POA: Diagnosis not present

## 2020-11-07 DIAGNOSIS — Z20822 Contact with and (suspected) exposure to covid-19: Secondary | ICD-10-CM | POA: Diagnosis not present

## 2020-11-07 DIAGNOSIS — F10129 Alcohol abuse with intoxication, unspecified: Secondary | ICD-10-CM | POA: Diagnosis not present

## 2020-11-07 DIAGNOSIS — F32A Depression, unspecified: Secondary | ICD-10-CM | POA: Diagnosis not present

## 2020-11-07 DIAGNOSIS — Z79890 Hormone replacement therapy (postmenopausal): Secondary | ICD-10-CM

## 2020-11-07 DIAGNOSIS — R45851 Suicidal ideations: Secondary | ICD-10-CM | POA: Diagnosis present

## 2020-11-07 DIAGNOSIS — F411 Generalized anxiety disorder: Secondary | ICD-10-CM | POA: Diagnosis not present

## 2020-11-07 DIAGNOSIS — Z8673 Personal history of transient ischemic attack (TIA), and cerebral infarction without residual deficits: Secondary | ICD-10-CM | POA: Diagnosis not present

## 2020-11-07 DIAGNOSIS — Z720 Tobacco use: Secondary | ICD-10-CM | POA: Diagnosis present

## 2020-11-07 DIAGNOSIS — E039 Hypothyroidism, unspecified: Secondary | ICD-10-CM | POA: Diagnosis present

## 2020-11-07 DIAGNOSIS — F4310 Post-traumatic stress disorder, unspecified: Secondary | ICD-10-CM | POA: Diagnosis not present

## 2020-11-07 DIAGNOSIS — F1920 Other psychoactive substance dependence, uncomplicated: Secondary | ICD-10-CM | POA: Diagnosis not present

## 2020-11-07 DIAGNOSIS — F4312 Post-traumatic stress disorder, chronic: Secondary | ICD-10-CM | POA: Diagnosis not present

## 2020-11-07 DIAGNOSIS — F322 Major depressive disorder, single episode, severe without psychotic features: Secondary | ICD-10-CM | POA: Insufficient documentation

## 2020-11-07 DIAGNOSIS — G8929 Other chronic pain: Secondary | ICD-10-CM | POA: Diagnosis present

## 2020-11-07 DIAGNOSIS — F333 Major depressive disorder, recurrent, severe with psychotic symptoms: Principal | ICD-10-CM | POA: Diagnosis present

## 2020-11-07 DIAGNOSIS — F10220 Alcohol dependence with intoxication, uncomplicated: Secondary | ICD-10-CM | POA: Diagnosis not present

## 2020-11-07 DIAGNOSIS — Z79899 Other long term (current) drug therapy: Secondary | ICD-10-CM | POA: Diagnosis not present

## 2020-11-07 LAB — BASIC METABOLIC PANEL
Anion gap: 10 (ref 5–15)
BUN: 10 mg/dL (ref 6–20)
CO2: 26 mmol/L (ref 22–32)
Calcium: 9 mg/dL (ref 8.9–10.3)
Chloride: 100 mmol/L (ref 98–111)
Creatinine, Ser: 0.81 mg/dL (ref 0.61–1.24)
GFR, Estimated: >60 mL/min (ref >60)
Glucose, Bld: 117 mg/dL — ABNORMAL HIGH (ref 70–99)
Potassium: 3.4 mmol/L — ABNORMAL LOW (ref 3.5–5.1)
Sodium: 136 mmol/L (ref 135–145)

## 2020-11-07 MED ORDER — LORAZEPAM 2 MG/ML IJ SOLN
0.0000 mg | Freq: Two times a day (BID) | INTRAMUSCULAR | Status: AC
Start: 2020-11-08 — End: 2020-11-10

## 2020-11-07 MED ORDER — ACETAMINOPHEN 325 MG PO TABS
650.0000 mg | ORAL_TABLET | Freq: Four times a day (QID) | ORAL | Status: DC | PRN
  Administered 2020-11-09 (×2): 650 mg via ORAL
  Filled 2020-11-07 (×2): qty 2

## 2020-11-07 MED ORDER — TEMAZEPAM 15 MG PO CAPS
15.0000 mg | ORAL_CAPSULE | Freq: Every day | ORAL | Status: DC
  Administered 2020-11-07 – 2020-11-08 (×2): 15 mg via ORAL
  Filled 2020-11-07 (×2): qty 1

## 2020-11-07 MED ORDER — LISINOPRIL 5 MG PO TABS
5.0000 mg | ORAL_TABLET | Freq: Every day | ORAL | Status: DC
  Administered 2020-11-08 – 2020-11-11 (×4): 5 mg via ORAL
  Filled 2020-11-07 (×5): qty 1

## 2020-11-07 MED ORDER — HYDROXYZINE HCL 25 MG PO TABS
25.0000 mg | ORAL_TABLET | Freq: Three times a day (TID) | ORAL | Status: DC | PRN
Start: 2020-11-07 — End: 2020-11-07

## 2020-11-07 MED ORDER — QUETIAPINE FUMARATE 200 MG PO TABS
200.0000 mg | ORAL_TABLET | Freq: Every day | ORAL | Status: DC
  Administered 2020-11-07 – 2020-11-10 (×4): 200 mg via ORAL
  Filled 2020-11-07 (×4): qty 1

## 2020-11-07 MED ORDER — FLUOXETINE HCL 20 MG PO CAPS
80.0000 mg | ORAL_CAPSULE | Freq: Every day | ORAL | Status: DC
  Administered 2020-11-08 – 2020-11-11 (×4): 80 mg via ORAL
  Filled 2020-11-07 (×4): qty 4

## 2020-11-07 MED ORDER — PANTOPRAZOLE SODIUM 40 MG PO TBEC
40.0000 mg | DELAYED_RELEASE_TABLET | Freq: Every day | ORAL | Status: DC
  Administered 2020-11-08 – 2020-11-11 (×4): 40 mg via ORAL
  Filled 2020-11-07 (×4): qty 1

## 2020-11-07 MED ORDER — THIAMINE HCL 100 MG PO TABS
100.0000 mg | ORAL_TABLET | Freq: Every day | ORAL | Status: DC
  Administered 2020-11-08 – 2020-11-11 (×4): 100 mg via ORAL
  Filled 2020-11-07 (×4): qty 1

## 2020-11-07 MED ORDER — GABAPENTIN 400 MG PO CAPS
800.0000 mg | ORAL_CAPSULE | Freq: Three times a day (TID) | ORAL | Status: DC
Start: 2020-11-07 — End: 2020-11-11
  Administered 2020-11-07 – 2020-11-11 (×12): 800 mg via ORAL
  Filled 2020-11-07 (×11): qty 2
  Filled 2020-11-07: qty 8
  Filled 2020-11-07: qty 2

## 2020-11-07 MED ORDER — GABAPENTIN 800 MG PO TABS
800.0000 mg | ORAL_TABLET | Freq: Three times a day (TID) | ORAL | Status: DC
  Filled 2020-11-07 (×3): qty 1

## 2020-11-07 MED ORDER — LORAZEPAM 2 MG PO TABS
0.0000 mg | ORAL_TABLET | Freq: Two times a day (BID) | ORAL | Status: AC
  Administered 2020-11-08 (×2): 1 mg via ORAL
  Filled 2020-11-07 (×2): qty 1

## 2020-11-07 MED ORDER — ALUM & MAG HYDROXIDE-SIMETH 200-200-20 MG/5ML PO SUSP: 30.0000 mL | ORAL | Status: DC | PRN

## 2020-11-07 MED ORDER — GABAPENTIN 600 MG PO TABS: 600.0000 mg | ORAL_TABLET | Freq: Three times a day (TID) | ORAL | Status: DC

## 2020-11-07 MED ORDER — LORAZEPAM 2 MG PO TABS
0.0000 mg | ORAL_TABLET | Freq: Four times a day (QID) | ORAL | Status: AC
  Administered 2020-11-07: 2 mg via ORAL
  Filled 2020-11-07: qty 1

## 2020-11-07 MED ORDER — MAGNESIUM HYDROXIDE 400 MG/5ML PO SUSP: 30.0000 mL | Freq: Every day | ORAL | Status: DC | PRN

## 2020-11-07 MED ORDER — LEVOTHYROXINE SODIUM 50 MCG PO TABS
125.0000 ug | ORAL_TABLET | Freq: Every day | ORAL | Status: DC
  Administered 2020-11-08 – 2020-11-11 (×4): 125 ug via ORAL
  Filled 2020-11-07 (×4): qty 1

## 2020-11-07 MED ORDER — THIAMINE HCL 100 MG/ML IJ SOLN: 100.0000 mg | Freq: Every day | INTRAMUSCULAR | Status: DC

## 2020-11-07 MED ORDER — LISINOPRIL 5 MG PO TABS
5.0000 mg | ORAL_TABLET | Freq: Every day | ORAL | Status: DC
  Administered 2020-11-07: 5 mg via ORAL
  Filled 2020-11-07: qty 1

## 2020-11-07 MED ORDER — PRAZOSIN HCL 1 MG PO CAPS
1.0000 mg | ORAL_CAPSULE | Freq: Every day | ORAL | Status: DC
  Administered 2020-11-07 – 2020-11-10 (×4): 1 mg via ORAL
  Filled 2020-11-07 (×4): qty 1

## 2020-11-07 MED ORDER — TRAZODONE HCL 100 MG PO TABS
100.0000 mg | ORAL_TABLET | Freq: Every evening | ORAL | Status: DC | PRN
  Administered 2020-11-07 – 2020-11-10 (×3): 100 mg via ORAL
  Filled 2020-11-07 (×4): qty 1

## 2020-11-07 MED ORDER — LORAZEPAM 2 MG/ML IJ SOLN: 0.0000 mg | Freq: Four times a day (QID) | INTRAMUSCULAR | Status: AC

## 2020-11-07 MED ORDER — HYDROXYZINE HCL 25 MG PO TABS
25.0000 mg | ORAL_TABLET | Freq: Three times a day (TID) | ORAL | Status: DC | PRN
Start: 2020-11-07 — End: 2020-11-11
  Administered 2020-11-07 – 2020-11-10 (×4): 25 mg via ORAL
  Filled 2020-11-07 (×4): qty 1

## 2020-11-07 NOTE — H&P (Signed)
Psychiatric Admission Assessment Adult  Patient Identification: Ivan Hamilton MRN:  [id number] Date of Evaluation:  11/07/2020 Chief Complaint:  Severe major depression without psychotic features ([place]) [F32.2] Principal Diagnosis: Severe recurrent major depressive disorder with psychotic features ([place]) Diagnosis:  Principal Problem:   Severe recurrent major depressive disorder with psychotic features ([place]) Active Problems:   Chronic post-traumatic stress disorder (PTSD)   Alcohol use disorder, severe, dependence ([place])   Tobacco abuse   Generalized anxiety disorder  CC "I need help."  History of Present Illness: 55 year old male with MDD, PTSD, and alcohol use disorder presenting for suicidal statements. After patient was discharged in October he was able to stay sober for roughly two months. However, he had a recent fight with his ex wife and drank a bottle of tequila. He notes he blacked out and cannot remember anything until waking up here in the hospital. He is unsure if he called the police, or his family called the police to bring him here. He notes that he often yells at people when he is drunk and says hurtful things. He feels he needs help staying sober as his current mental health treatment twice monthly is inadequate. He continues to have passive suicidal ideations. He also has visual hallucinations of people at his mailbox, or in his room at night. He has auditory hallucinations of people having conversations outside of his house. He denies any homicidal ideations. He spends a fair amount of time recounting his time in war in [place], and his PTSD symptoms stemming from this time. He is in agreement to restart medications he was on during previous admission. He also requests that I call his probation officer.   Officer Konrad Penta (probation officer), [phone] ext [phone]: Voicemail left with update that patient was in the hospital. Provided number to the hospital.  Associated  Signs/Symptoms: Depression Symptoms:  depressed mood, insomnia, feelings of worthlessness/guilt, difficulty concentrating, recurrent thoughts of death, anxiety, loss of energy/fatigue, Duration of Depression Symptoms: Greater than two weeks  (Hypo) Manic Symptoms:  Hallucinations, Anxiety Symptoms:  Excessive Worry, Panic Symptoms, Psychotic Symptoms:  Hallucinations: Auditory Visual Paranoia, PTSD Symptoms: Had a traumatic exposure:  Witnessed mass casaulties in war Re-experiencing:  Flashbacks Intrusive Thoughts Nightmares Hypervigilance:  Yes Hyperarousal:  Difficulty Concentrating Increased Startle Response Irritability/Anger Sleep Avoidance:  Foreshortened Future Total Time spent with patient: 1 hour  Past Psychiatric History: Longstanding history of PTSD and alcohol abuse.  Has had short periods of sobriety but does not really follow up very well with outpatient treatment.  Does have a history of many suicidal threats and some self injury  Is the patient at risk to self? Yes.    Has the patient been a risk to self in the past 6 months? Yes.    Has the patient been a risk to self within the distant past? Yes.    Is the patient a risk to others? No.  Has the patient been a risk to others in the past 6 months? No.  Has the patient been a risk to others within the distant past? No.   Prior Inpatient Therapy:   Prior Outpatient Therapy:    Alcohol Screening: 1. How often do you have a drink containing alcohol?: 4 or more times a week 2. How many drinks containing alcohol do you have on a typical day when you are drinking?: 3 or 4 3. How often do you have six or more drinks on one occasion?: Weekly AUDIT-C Score: 8 4. How often during  the last year have you found that you were not able to stop drinking once you had started?: Monthly 5. How often during the last year have you failed to do what was normally expected from you because of drinking?: Monthly 6. How often  during the last year have you needed a first drink in the morning to get yourself going after a heavy drinking session?: Weekly 7. How often during the last year have you had a feeling of guilt of remorse after drinking?: Daily or almost daily 8. How often during the last year have you been unable to remember what happened the night before because you had been drinking?: Less than monthly 9. Have you or someone else been injured as a result of your drinking?: No 10. Has a relative or friend or a doctor or another health worker been concerned about your drinking or suggested you cut down?: Yes, during the last year Alcohol Use Disorder Identification Test Final Score (AUDIT): 24 Alcohol Brief Interventions/Follow-up: Alcohol education/Brief advice Substance Abuse History in the last 12 months:  Yes.   Consequences of Substance Abuse: Worsening mental and physical health, worsening relationship with family, loss of job Previous Psychotropic Medications: Yes  Psychological Evaluations: Yes  Past Medical History:  Past Medical History:  Diagnosis Date  . Alcohol withdrawal ([place])   . B12 deficiency   . Chronic pain   . CVA (cerebral vascular accident) ([place])   . Hypertension   . Hypothyroidism   . Varicose veins     Past Surgical History:  Procedure Laterality Date  . CHOLECYSTECTOMY    . ESOPHAGOGASTRODUODENOSCOPY (EGD) WITH PROPOFOL N/A 10/24/2017   Procedure: ESOPHAGOGASTRODUODENOSCOPY (EGD) WITH PROPOFOL;  Surgeon: [name], MD;  Location: [facility] ENDOSCOPY;  Service: Endoscopy;  Laterality: N/A;  . ESOPHAGOGASTRODUODENOSCOPY (EGD) WITH PROPOFOL N/A 07/12/2019   Procedure: ESOPHAGOGASTRODUODENOSCOPY (EGD) WITH PROPOFOL;  Surgeon: Jonathon Bellows, MD;  Location: [facility] ENDOSCOPY;  Service: Gastroenterology;  Laterality: N/A;  . LEG SURGERY     Family History: History reviewed. No pertinent family history. Family Psychiatric  History: Family members with alcohol use disorder Tobacco Screening:  Have you used any form of tobacco in the last 30 days? (Cigarettes, Smokeless Tobacco, Cigars, and/or Pipes): No Social History:  Social History   Substance and Sexual Activity  Alcohol Use Yes  . Alcohol/week: 21.0 standard drinks  . Types: 21 Shots of liquor per week   Comment: daily- 1 pint of vodka     Social History   Substance and Sexual Activity  Drug Use No    Additional Social History:                           Allergies:  No Known Allergies Lab Results:  Results for orders placed or performed during the hospital encounter of 11/05/20 (from the past 48 hour(s))  Comprehensive metabolic panel     Status: Abnormal   Collection Time: 11/05/20  9:56 PM  Result Value Ref Range   Sodium 142 135 - 145 mmol/L   Potassium 3.4 (L) 3.5 - 5.1 mmol/L   Chloride 106 98 - 111 mmol/L   CO2 22 22 - 32 mmol/L   Glucose, Bld 101 (H) 70 - 99 mg/dL    Comment: Glucose reference range applies only to samples taken after fasting for at least 8 hours.   BUN 10 6 - 20 mg/dL   Creatinine, Ser 0.92 0.61 - 1.24 mg/dL   Calcium 9.3 8.9 -  10.3 mg/dL   Total Protein 9.3 (H) 6.5 - 8.1 g/dL   Albumin 4.9 3.5 - 5.0 g/dL   AST 36 15 - 41 U/L   ALT 24 0 - 44 U/L   Alkaline Phosphatase 57 38 - 126 U/L   Total Bilirubin 0.9 0.3 - 1.2 mg/dL   GFR, Estimated >60 >60 mL/min    Comment: (NOTE) Calculated using the CKD-EPI Creatinine Equation (2021)    Anion gap 14 5 - 15    Comment: Performed at [facility], [address] [place], [place] [address]  Ethanol     Status: Abnormal   Collection Time: 11/05/20  9:56 PM  Result Value Ref Range   Alcohol, Ethyl (B) 322 (HH) <10 mg/dL    Comment: CRITICAL RESULT CALLED TO, READ BACK BY AND VERIFIED WITH  AUSTIN REEVES 11/05/20 2242 ADL (NOTE) Lowest detectable limit for serum alcohol is 10 mg/dL.  For medical purposes only. Performed at [facility], [address] [place], [place] [address]   Salicylate level      Status: Abnormal   Collection Time: 11/05/20  9:56 PM  Result Value Ref Range   Salicylate Lvl <8.8 (L) 7.0 - 30.0 mg/dL    Comment: Performed at [facility], [address] [place], [place] [address]  Acetaminophen level     Status: Abnormal   Collection Time: 11/05/20  9:56 PM  Result Value Ref Range   Acetaminophen (Tylenol), Serum <10 (L) 10 - 30 ug/mL    Comment: (NOTE) Therapeutic concentrations vary significantly. A range of 10-30 ug/mL  may be an effective concentration for many patients. However, some  are best treated at concentrations outside of this range. Acetaminophen concentrations >150 ug/mL at 4 hours after ingestion  and >50 ug/mL at 12 hours after ingestion are often associated with  toxic reactions.  Performed at [facility], [address] [place], [place] [address]   cbc     Status: Abnormal   Collection Time: 11/05/20  9:56 PM  Result Value Ref Range   WBC 7.4 4.0 - 10.5 K/uL   RBC 5.41 4.22 - 5.81 MIL/uL   Hemoglobin 15.2 13.0 - 17.0 g/dL   HCT 45.1 39.0 - 52.0 %   MCV 83.4 80.0 - 100.0 fL   MCH 28.1 26.0 - 34.0 pg   MCHC 33.7 30.0 - 36.0 g/dL   RDW 17.8 (H) 11.5 - 15.5 %   Platelets 258 150 - 400 K/uL   nRBC 0.0 0.0 - 0.2 %    Comment: Performed at [facility], [address] [place], [place] [address]  Urine Drug Screen, Qualitative     Status: None   Collection Time: 11/05/20  9:56 PM  Result Value Ref Range   Tricyclic, Ur Screen NONE DETECTED NONE DETECTED   Amphetamines, Ur Screen NONE DETECTED NONE DETECTED   MDMA (Ecstasy)Ur Screen NONE DETECTED NONE DETECTED   Cocaine Metabolite,Ur [place] NONE DETECTED NONE DETECTED   Opiate, Ur Screen NONE DETECTED NONE DETECTED   Phencyclidine (PCP) Ur S NONE DETECTED NONE DETECTED   Cannabinoid 50 Ng, Ur [place] NONE DETECTED NONE DETECTED   Barbiturates, Ur Screen NONE DETECTED NONE DETECTED   Benzodiazepine, Ur Scrn NONE DETECTED NONE DETECTED   Methadone Scn, Ur NONE  DETECTED NONE DETECTED    Comment: (NOTE) Tricyclics + metabolites, urine    Cutoff 1000 ng/mL Amphetamines + metabolites, urine  Cutoff 1000 ng/mL MDMA (Ecstasy), urine  Cutoff 500 ng/mL Cocaine Metabolite, urine          Cutoff 300 ng/mL Opiate + metabolites, urine        Cutoff 300 ng/mL Phencyclidine (PCP), urine         Cutoff 25 ng/mL Cannabinoid, urine                 Cutoff 50 ng/mL Barbiturates + metabolites, urine  Cutoff 200 ng/mL Benzodiazepine, urine              Cutoff 200 ng/mL Methadone, urine                   Cutoff 300 ng/mL  The urine drug screen provides only a preliminary, unconfirmed analytical test result and should not be used for non-medical purposes. Clinical consideration and professional judgment should be applied to any positive drug screen result due to possible interfering substances. A more specific alternate chemical method must be used in order to obtain a confirmed analytical result. Gas chromatography / mass spectrometry (GC/MS) is the preferred confirm atory method. Performed at [facility], [address] [place], [place] [address]   Resp Panel by RT-PCR (Flu A&B, Covid) Nasopharyngeal Swab     Status: None   Collection Time: 11/06/20  2:13 PM   Specimen: Nasopharyngeal Swab; Nasopharyngeal(NP) swabs in vial transport medium  Result Value Ref Range   SARS Coronavirus 2 by RT PCR NEGATIVE NEGATIVE    Comment: (NOTE) SARS-CoV-2 target nucleic acids are NOT DETECTED.  The SARS-CoV-2 RNA is generally detectable in upper respiratory specimens during the acute phase of infection. The lowest concentration of SARS-CoV-2 viral copies this assay can detect is 138 copies/mL. A negative result does not preclude SARS-Cov-2 infection and should not be used as the sole basis for treatment or other patient management decisions. A negative result may occur with  improper specimen collection/handling, submission of specimen  other than nasopharyngeal swab, presence of viral mutation(s) within the areas targeted by this assay, and inadequate number of viral copies(<138 copies/mL). A negative result must be combined with clinical observations, patient history, and epidemiological information. The expected result is Negative.  Fact Sheet for Patients:  EntrepreneurPulse.com.au  Fact Sheet for Healthcare Providers:  IncredibleEmployment.be  This test is no t yet approved or cleared by the [place] FDA and  has been authorized for detection and/or diagnosis of SARS-CoV-2 by FDA under an Emergency Use Authorization (EUA). This EUA will remain  in effect (meaning this test can be used) for the duration of the COVID-19 declaration under Section 564(b)(1) of the Act, 21 U.S.C.section 360bbb-3(b)(1), unless the authorization is terminated  or revoked sooner.       Influenza A by PCR NEGATIVE NEGATIVE   Influenza B by PCR NEGATIVE NEGATIVE    Comment: (NOTE) The Xpert Xpress SARS-CoV-2/FLU/RSV plus assay is intended as an aid in the diagnosis of influenza from Nasopharyngeal swab specimens and should not be used as a sole basis for treatment. Nasal washings and aspirates are unacceptable for Xpert Xpress SARS-CoV-2/FLU/RSV testing.  Fact Sheet for Patients: EntrepreneurPulse.com.au  Fact Sheet for Healthcare Providers: IncredibleEmployment.be  This test is not yet approved or cleared by the [place] FDA and has been authorized for detection and/or diagnosis of SARS-CoV-2 by FDA under an Emergency Use Authorization (EUA). This EUA will remain in effect (meaning this test can be used) for the duration of the COVID-19 declaration under Section 564(b)(1) of the Act, 21 U.S.C. section 360bbb-3(b)(1), unless the  authorization is terminated or revoked.  Performed at [facility], [address] [place], [place]  [address]   Ethanol     Status: None   Collection Time: 11/06/20  8:34 PM  Result Value Ref Range   Alcohol, Ethyl (B) <10 <10 mg/dL    Comment: (NOTE) Lowest detectable limit for serum alcohol is 10 mg/dL.  For medical purposes only. Performed at [facility], [address] [place], [place] [address]   Basic metabolic panel     Status: Abnormal   Collection Time: 11/07/20  1:47 PM  Result Value Ref Range   Sodium 136 135 - 145 mmol/L   Potassium 3.4 (L) 3.5 - 5.1 mmol/L   Chloride 100 98 - 111 mmol/L   CO2 26 22 - 32 mmol/L   Glucose, Bld 117 (H) 70 - 99 mg/dL    Comment: Glucose reference range applies only to samples taken after fasting for at least 8 hours.   BUN 10 6 - 20 mg/dL   Creatinine, Ser 0.81 0.61 - 1.24 mg/dL   Calcium 9.0 8.9 - 10.3 mg/dL   GFR, Estimated >60 >60 mL/min    Comment: (NOTE) Calculated using the CKD-EPI Creatinine Equation (2021)    Anion gap 10 5 - 15    Comment: Performed at [facility], [address] [place], [place] [address]    Blood Alcohol level:  Lab Results  Component Value Date   ETH <10 11/06/2020   ETH 322 (HH) 21/30/8657    Metabolic Disorder Labs:  Lab Results  Component Value Date   HGBA1C 5.3 05/12/2020   MPG 105 05/12/2020   MPG 116.89 08/29/2018   No results found for: PROLACTIN Lab Results  Component Value Date   CHOL 207 (H) 05/12/2020   TRIG 72 05/12/2020   HDL 90 05/12/2020   CHOLHDL 2.3 05/12/2020   VLDL 14 05/12/2020   LDLCALC 103 (H) 05/12/2020   LDLCALC 96 08/29/2018    Current Medications: Current Facility-Administered Medications  Medication Dose Route Frequency Provider Last Rate Last Admin  . acetaminophen (TYLENOL) tablet 650 mg  650 mg Oral Q6H PRN Clapacs, John T, MD      . alum & mag hydroxide-simeth (MAALOX/MYLANTA) 200-200-20 MG/5ML suspension 30 mL  30 mL Oral Q4H PRN Clapacs, Madie Reno, MD      . Derrill Memo ON 11/08/2020] FLUoxetine (PROZAC) capsule 80 mg  80 mg Oral Daily  Clapacs, John T, MD      . gabapentin (NEURONTIN) tablet 800 mg  800 mg Oral TID Salley Scarlet, MD      . hydrOXYzine (ATARAX/VISTARIL) tablet 25 mg  25 mg Oral Q8H PRN Salley Scarlet, MD      . Derrill Memo ON 11/08/2020] levothyroxine (SYNTHROID) tablet 125 mcg  125 mcg Oral Q0600 Clapacs, Madie Reno, MD      . Derrill Memo ON 11/08/2020] lisinopril (ZESTRIL) tablet 5 mg  5 mg Oral Daily Clapacs, John T, MD      . LORazepam (ATIVAN) injection 0-4 mg  0-4 mg Intravenous Q6H Clapacs, Madie Reno, MD       Or  . LORazepam (ATIVAN) tablet 0-4 mg  0-4 mg Oral Q6H Clapacs, John T, MD      . Derrill Memo ON 11/08/2020] LORazepam (ATIVAN) injection 0-4 mg  0-4 mg Intravenous Q12H Clapacs, John T, MD       Or  . Derrill Memo ON 11/08/2020] LORazepam (ATIVAN) tablet 0-4 mg  0-4 mg Oral Q12H Clapacs, John T,  MD      . magnesium hydroxide (MILK OF MAGNESIA) suspension 30 mL  30 mL Oral Daily PRN Clapacs, Madie Reno, MD      . Derrill Memo ON 11/08/2020] pantoprazole (PROTONIX) EC tablet 40 mg  40 mg Oral Q1200 Clapacs, John T, MD      . prazosin (MINIPRESS) capsule 1 mg  1 mg Oral QHS Clapacs, John T, MD      . QUEtiapine (SEROQUEL) tablet 200 mg  200 mg Oral QHS Clapacs, John T, MD      . temazepam (RESTORIL) capsule 15 mg  15 mg Oral QHS Clapacs, John T, MD      . Derrill Memo ON 11/08/2020] thiamine tablet 100 mg  100 mg Oral Daily Clapacs, Madie Reno, MD       Or  . Derrill Memo ON 11/08/2020] thiamine (B-1) injection 100 mg  100 mg Intravenous Daily Clapacs, John T, MD      . traZODone (DESYREL) tablet 100 mg  100 mg Oral QHS PRN Clapacs, Madie Reno, MD       PTA Medications: Medications Prior to Admission  Medication Sig Dispense Refill Last Dose  . acamprosate (CAMPRAL) 333 MG tablet Take 666 mg by mouth 3 (three) times daily.     . busPIRone (BUSPAR) 15 MG tablet Take 15 mg by mouth 2 (two) times daily.     . citalopram (CELEXA) 10 MG tablet Take 10 mg by mouth daily.     . cyclobenzaprine (FLEXERIL) 10 MG tablet Take 1 tablet by mouth 3 (three) times  daily as needed.     . doxycycline (VIBRAMYCIN) 100 MG capsule Take 100 mg by mouth 2 (two) times daily.     Marland Kitchen FLUoxetine (PROZAC) 40 MG capsule Take 2 capsules (80 mg total) by mouth daily. 60 capsule 1   . gabapentin (NEURONTIN) 600 MG tablet Take 1 tablet (600 mg total) by mouth 3 (three) times daily. 30 tablet 1   . hydrOXYzine (ATARAX/VISTARIL) 25 MG tablet Take 1 tablet (25 mg total) by mouth every 8 (eight) hours as needed for itching. 90 tablet 1   . levothyroxine (SYNTHROID) 125 MCG tablet Take 1 tablet (125 mcg total) by mouth daily at 6 (six) AM. 30 tablet 1   . pantoprazole (PROTONIX) 40 MG tablet Take 1 tablet (40 mg total) by mouth daily at 12 noon. 30 tablet 1   . prazosin (MINIPRESS) 1 MG capsule Take 1 capsule (1 mg total) by mouth at bedtime. (Patient not taking: No sig reported) 30 capsule 1   . QUEtiapine (SEROQUEL) 200 MG tablet Take 1 tablet (200 mg total) by mouth at bedtime. 30 tablet 1   . temazepam (RESTORIL) 15 MG capsule Take 1 capsule (15 mg total) by mouth at bedtime. 30 capsule 1   . traZODone (DESYREL) 50 MG tablet Take 2 tablets (100 mg total) by mouth at bedtime as needed for sleep. 60 tablet 1     Musculoskeletal: Strength & Muscle Tone: within normal limits Gait & Station: normal Patient leans: N/A            Psychiatric Specialty Exam:  Presentation  General Appearance: Disheveled  Eye Contact:Good  Speech:Normal Rate  Speech Volume:Normal  Handedness:Right   Mood and Affect  Mood:Depressed  Affect:Congruent; Tearful   Thought Process  Thought Processes:Coherent  Duration of Psychotic Symptoms: Less than six months  Past Diagnosis of Schizophrenia or Psychoactive disorder: No  Descriptions of Associations:Intact  Orientation:Full (Time, Place and Person)  Thought Content:Perseveration  Hallucinations:Hallucinations: Auditory  Ideas of Reference:None  Suicidal Thoughts:Suicidal Thoughts: Yes, Passive  Homicidal  Thoughts:Homicidal Thoughts: No   Sensorium  Memory:Immediate Fair; Recent Poor; Remote Fair  Judgment:Poor  Insight:Present   Executive Functions  Concentration:Fair  Attention Span:Fair  [place]   Psychomotor Activity  Psychomotor Activity:Psychomotor Activity: Normal   Assets  Assets:Communication Skills; Desire for Improvement; Financial Resources/Insurance; Housing; Physical Health; Resilience; Social Support   Sleep  Sleep:Sleep: Fair    Physical Exam: Physical Exam Vitals and nursing note reviewed.  Constitutional:      Appearance: Normal appearance.  HENT:     Head: Normocephalic and atraumatic.     Right Ear: External ear normal.     Left Ear: External ear normal.     Nose: Nose normal.     Mouth/Throat:     Mouth: Mucous membranes are moist.     Pharynx: Oropharynx is clear.  Eyes:     Extraocular Movements: Extraocular movements intact.     Conjunctiva/sclera: Conjunctivae normal.     Pupils: Pupils are equal, round, and reactive to light.  Cardiovascular:     Rate and Rhythm: Normal rate.     Pulses: Normal pulses.  Pulmonary:     Effort: Pulmonary effort is normal.     Breath sounds: Normal breath sounds.  Abdominal:     General: Abdomen is flat.     Palpations: Abdomen is soft.  Musculoskeletal:        General: No swelling. Normal range of motion.     Cervical back: Normal range of motion and neck supple.  Skin:    General: Skin is warm and dry.  Neurological:     General: No focal deficit present.     Mental Status: He is alert and oriented to person, place, and time.  Psychiatric:        Attention and Perception: He perceives auditory and visual hallucinations.        Mood and Affect: Mood is depressed. Affect is tearful.        Speech: Speech is rapid and pressured.        Behavior: Behavior is withdrawn.        Thought Content: Thought content is paranoid. Thought content includes  suicidal ideation.        Cognition and Memory: Cognition is impaired. Memory is impaired.        Judgment: Judgment is impulsive.    Review of Systems  Constitutional: Positive for malaise/fatigue and weight loss.  HENT: Negative.   Eyes: Negative.   Respiratory: Negative.   Cardiovascular: Negative.   Gastrointestinal: Negative.   Genitourinary: Negative.   Musculoskeletal: Negative.   Skin: Negative.   Neurological: Negative.   Endo/Heme/Allergies: Negative.   Psychiatric/Behavioral: Positive for depression, hallucinations, memory loss, substance abuse and suicidal ideas. The patient is nervous/anxious and has insomnia.    Blood pressure (!) 146/88, pulse 97, temperature 98.5 F (36.9 C), temperature source Oral, resp. rate 17, height 5\' 5"  (1.651 m), weight 68.9 kg, SpO2 99 %. Body mass index is 25.29 kg/m.  Treatment Plan Summary: Daily contact with patient to assess and evaluate symptoms and progress in treatment and Medication management   1-3) MDD, recurrent severe with psychotic features; chronic PTSD; GAD - Restart Prozac 80 mg daily for mood, seroquel 200 mg QHS for hallucinations, prazosin 1 mg QHS for nightmares, Restoril 15 mg QHS for sleep  4) Alcohol Use Disorder, severe- established problem, unstable - Gabapentin 800 mg  TID, will offer Acamprosate during admission for FDA approved assistance for alcohol use disorder - CIWA protocol - MVI, folate, thiamine, check vitamin b1 levels  - Requesting residential substance abuse treatment  5) HTN- established problem - Continue lisinipril 5 mg daily  Observation Level/Precautions:  Detox  Laboratory:  Vitamin B1, hemoglobin A1c, Lipid panel  Psychotherapy:    Medications:    Consultations:    Discharge Concerns:    Estimated LOS:  Other:     Physician Treatment Plan for Primary Diagnosis: Severe recurrent major depressive disorder with psychotic features ([place]) Long Term Goal(s): Improvement in symptoms so as  ready for discharge  Short Term Goals: Ability to identify changes in lifestyle to reduce recurrence of condition will improve, Ability to verbalize feelings will improve, Ability to disclose and discuss suicidal ideas, Ability to demonstrate self-control will improve, Ability to identify and develop effective coping behaviors will improve, Ability to maintain clinical measurements within normal limits will improve, Compliance with prescribed medications will improve and Ability to identify triggers associated with substance abuse/mental health issues will improve  Physician Treatment Plan for Secondary Diagnosis: Principal Problem:   Severe recurrent major depressive disorder with psychotic features ([place]) Active Problems:   Chronic post-traumatic stress disorder (PTSD)   Alcohol use disorder, severe, dependence ([place])   Tobacco abuse   Generalized anxiety disorder  Long Term Goal(s): Improvement in symptoms so as ready for discharge  Short Term Goals: Ability to identify changes in lifestyle to reduce recurrence of condition will improve, Ability to verbalize feelings will improve, Ability to disclose and discuss suicidal ideas, Ability to demonstrate self-control will improve, Ability to identify and develop effective coping behaviors will improve, Ability to maintain clinical measurements within normal limits will improve, Compliance with prescribed medications will improve and Ability to identify triggers associated with substance abuse/mental health issues will improve  I certify that inpatient services furnished can reasonably be expected to improve the patient's condition.    Salley Scarlet, MD 4/18/20224:54 PM

## 2020-11-07 NOTE — ED Provider Notes (Signed)
Emergency Medicine Observation Re-evaluation Note  Ivan Hamilton is a 55 y.o. male, seen on rounds today.  Pt initially presented to the ED for complaints of Suicidal (Patient is suicidal without a plan), Addiction Problem, Depression, and Post-Traumatic Stress Disorder Currently, the patient is resting, voices no medical complaint.  Physical Exam  BP (!) 143/74   Pulse 80   Temp 98.9 F (37.2 C) (Oral)   Resp 16   SpO2 97%  Physical Exam General: Resting in no acute distress Cardiac: No cyanosis Lungs: Equal rise and fall Psych: Not agitated  ED Course / MDM  EKG:   I have reviewed the labs performed to date as well as medications administered while in observation.  Recent changes in the last 24 hours include no events overnight.  Plan  Current plan is for BMU admission. Patient is under full IVC at this time.   Paulette Blanch, MD 11/07/20 [phone]

## 2020-11-07 NOTE — ED Notes (Signed)
BEHAVIORAL HEALTH ROUNDING Patient sleeping: Yes.   Patient alert and oriented: pt sleeping Behavior appropriate: Yes.  ; If no, describe:   Nutrition and fluids offered: pt sleeping Toileting and hygiene offered: pt sleeping Sitter present: not applicable Law enforcement present: Yes

## 2020-11-07 NOTE — BHH Suicide Risk Assessment (Signed)
[facility] Admission Suicide Risk Assessment   Nursing information obtained from:  Patient Demographic factors:  Male,Low socioeconomic status,Unemployed Current Mental Status:  NA Loss Factors:  Financial problems / change in socioeconomic status Historical Factors:  NA Risk Reduction Factors:  Living with another person, especially a relative,Positive social support  Total Time spent with patient: 1 hour Principal Problem: Severe recurrent major depressive disorder with psychotic features ([place]) Diagnosis:  Principal Problem:   Severe recurrent major depressive disorder with psychotic features ([place]) Active Problems:   Chronic post-traumatic stress disorder (PTSD)   Alcohol use disorder, severe, dependence ([place])   Tobacco abuse   HTN (hypertension)   Generalized anxiety disorder  Subjective Data: 55 year old male with MDD, PTSD, and alcohol use disorder presenting for suicidal statements. After patient was discharged in October he was able to stay sober for roughly two months. However, he had a recent fight with his ex wife and drank a bottle of tequila. He notes he blacked out and cannot remember anything until waking up here in the hospital. He is unsure if he called the police, or his family called the police to bring him here. He notes that he often yells at people when he is drunk and says hurtful things. He feels he needs help staying sober as his current mental health treatment twice monthly is inadequate. He continues to have passive suicidal ideations. He also has visual hallucinations of people at his mailbox, or in his room at night. He has auditory hallucinations of people having conversations outside of his house. He denies any homicidal ideations. He spends a fair amount of time recounting his time in war in [place], and his PTSD symptoms stemming from this time. He is in agreement to restart medications he was on during previous admission. He also requests that I call his probation  officer.   Continued Clinical Symptoms:  Alcohol Use Disorder Identification Test Final Score (AUDIT): 24 The "Alcohol Use Disorders Identification Test", Guidelines for Use in Primary Care, Second Edition.  World Pharmacologist [facility]). Score between 0-7:  no or low risk or alcohol related problems. Score between 8-15:  moderate risk of alcohol related problems. Score between 16-19:  high risk of alcohol related problems. Score 20 or above:  warrants further diagnostic evaluation for alcohol dependence and treatment.   CLINICAL FACTORS:   Severe Anxiety and/or Agitation Depression:   Comorbid alcohol abuse/dependence Hopelessness Impulsivity Insomnia Severe Alcohol/Substance Abuse/Dependencies More than one psychiatric diagnosis Currently Psychotic Unstable or Poor Therapeutic Relationship Previous Psychiatric Diagnoses and Treatments Medical Diagnoses and Treatments/Surgeries   Musculoskeletal: Strength & Muscle Tone: within normal limits Gait & Station: normal Patient leans: N/A  Psychiatric Specialty Exam:  Presentation  General Appearance: Disheveled  Eye Contact:Good  Speech:Normal Rate  Speech Volume:Normal  Handedness:Right   Mood and Affect  Mood:Depressed  Affect:Congruent; Tearful   Thought Process  Thought Processes:Coherent  Descriptions of Associations:Intact  Orientation:Full (Time, Place and Person)  Thought Content:Perseveration  History of Schizophrenia/Schizoaffective disorder:No  Duration of Psychotic Symptoms:Less than six months  Hallucinations:Hallucinations: Auditory  Ideas of Reference:None  Suicidal Thoughts:Suicidal Thoughts: Yes, Passive  Homicidal Thoughts:Homicidal Thoughts: No   Sensorium  Memory:Immediate Fair; Recent Poor; Remote Fair  Judgment:Poor  Insight:Present   Executive Functions  Concentration:Fair  Attention Span:Fair  [place]   Psychomotor Activity  Psychomotor Activity:Psychomotor Activity: Normal   Assets  Assets:Communication Skills; Desire for Improvement; Financial Resources/Insurance; Housing; Physical Health; Resilience; Social Support   Sleep  Sleep:Sleep: Fair    Physical Exam: Physical Exam ROS Blood pressure (!) 146/88, pulse 97, temperature 98.5 F (36.9 C), temperature source Oral, resp. rate 17, height 5\' 5"  (1.651 m), weight 68.9 kg, SpO2 99 %. Body mass index is 25.29 kg/m.   COGNITIVE FEATURES THAT CONTRIBUTE TO RISK:  Loss of executive function and Thought constriction (tunnel vision)    SUICIDE RISK:   Moderate:  Frequent suicidal ideation with limited intensity, and duration, some specificity in terms of plans, no associated intent, good self-control, limited dysphoria/symptomatology, some risk factors present, and identifiable protective factors, including available and accessible social support.  PLAN OF CARE: Continue inpatient admission, see H&P for details  I certify that inpatient services furnished can reasonably be expected to improve the patient's condition.   Salley Scarlet, MD 11/07/2020, 5:03 PM

## 2020-11-07 NOTE — Consult Note (Signed)
[place] Psychiatry Consult   Reason for Consult: Follow-up consult for this gentleman with alcohol abuse and PTSD with suicidal ideation Referring Physician: Corky Downs Patient Identification: Ivan Hamilton MRN:  [id number] Principal Diagnosis: PTSD (post-traumatic stress disorder) Diagnosis:  Principal Problem:   PTSD (post-traumatic stress disorder) Active Problems:   Hypothyroid   Alcohol use disorder, severe, dependence ([place])   Total Time spent with patient: 30 minutes  Subjective:   Ivan Hamilton is a 55 y.o. male patient admitted with "yes, I really need help".  HPI: Patient seen chart reviewed.  Patient note dictated yesterday.  Patient has a longstanding struggle with alcohol and relapsed into heavy drinking over the last day or so.  Continues to have depressed mood suicidal thoughts and feelings of hopelessness.  He has been cooperative with treatment here in the hospital.  He is slightly tremulous but there is no sign of delirium.  Vitals are stabilizing.  Patient has been restarted on outpatient medicine as well as detox protocol  Past Psychiatric History: Past history of longstanding alcohol abuse and PTSD.  Long struggles with sobriety  Risk to Self:   Risk to Others:   Prior Inpatient Therapy:   Prior Outpatient Therapy:    Past Medical History:  Past Medical History:  Diagnosis Date  . Alcohol withdrawal ([place])   . B12 deficiency   . Chronic pain   . CVA (cerebral vascular accident) ([place])   . Hypertension   . Hypothyroidism   . Varicose veins     Past Surgical History:  Procedure Laterality Date  . CHOLECYSTECTOMY    . ESOPHAGOGASTRODUODENOSCOPY (EGD) WITH PROPOFOL N/A 10/24/2017   Procedure: ESOPHAGOGASTRODUODENOSCOPY (EGD) WITH PROPOFOL;  Surgeon: [name], MD;  Location: [facility] ENDOSCOPY;  Service: Endoscopy;  Laterality: N/A;  . ESOPHAGOGASTRODUODENOSCOPY (EGD) WITH PROPOFOL N/A 07/12/2019   Procedure: ESOPHAGOGASTRODUODENOSCOPY (EGD) WITH  PROPOFOL;  Surgeon: Jonathon Bellows, MD;  Location[facility] ENDOSCOPY;  Service: Gastroenterology;  Laterality: N/A;  . LEG SURGERY     Family History: History reviewed. No pertinent family history. Family Psychiatric  History: Father had problems with alcohol as well Social History:  Social History   Substance and Sexual Activity  Alcohol Use Yes   Comment: daily- 1 pint of vodka     Social History   Substance and Sexual Activity  Drug Use No    Social History   Socioeconomic History  . Marital status: Married    Spouse name: Not on file  . Number of children: Not on file  . Years of education: Not on file  . Highest education level: Not on file  Occupational History  . Not on file  Tobacco Use  . Smoking status: Never Smoker  . Smokeless tobacco: Never Used  Vaping Use  . Vaping Use: Never used  Substance and Sexual Activity  . Alcohol use: Yes    Comment: daily- 1 pint of vodka  . Drug use: No  . Sexual activity: Not on file  Other Topics Concern  . Not on file  Social History Narrative  . Not on file   Social Determinants of Health   Financial Resource Strain: Not on file  Food Insecurity: Not on file  Transportation Needs: Not on file  Physical Activity: Not on file  Stress: Not on file  Social Connections: Not on file   Additional Social History:    Allergies:  No Known Allergies  Labs:  Results for orders placed or performed during the hospital encounter of 11/05/20 (from the past  48 hour(s))  Comprehensive metabolic panel     Status: Abnormal   Collection Time: 11/05/20  9:56 PM  Result Value Ref Range   Sodium 142 135 - 145 mmol/L   Potassium 3.4 (L) 3.5 - 5.1 mmol/L   Chloride 106 98 - 111 mmol/L   CO2 22 22 - 32 mmol/L   Glucose, Bld 101 (H) 70 - 99 mg/dL    Comment: Glucose reference range applies only to samples taken after fasting for at least 8 hours.   BUN 10 6 - 20 mg/dL   Creatinine, Ser 0.92 0.61 - 1.24 mg/dL   Calcium 9.3 8.9 - 10.3  mg/dL   Total Protein 9.3 (H) 6.5 - 8.1 g/dL   Albumin 4.9 3.5 - 5.0 g/dL   AST 36 15 - 41 U/L   ALT 24 0 - 44 U/L   Alkaline Phosphatase 57 38 - 126 U/L   Total Bilirubin 0.9 0.3 - 1.2 mg/dL   GFR, Estimated >60 >60 mL/min    Comment: (NOTE) Calculated using the CKD-EPI Creatinine Equation (2021)    Anion gap 14 5 - 15    Comment: Performed at [facility], [address] [place], [place] [address]  Ethanol     Status: Abnormal   Collection Time: 11/05/20  9:56 PM  Result Value Ref Range   Alcohol, Ethyl (B) 322 (HH) <10 mg/dL    Comment: CRITICAL RESULT CALLED TO, READ BACK BY AND VERIFIED WITH  AUSTIN REEVES 11/05/20 2242 ADL (NOTE) Lowest detectable limit for serum alcohol is 10 mg/dL.  For medical purposes only. Performed at [facility], [address] [place], [place] [address]   Salicylate level     Status: Abnormal   Collection Time: 11/05/20  9:56 PM  Result Value Ref Range   Salicylate Lvl <2.6 (L) 7.0 - 30.0 mg/dL    Comment: Performed at [facility], [address] [place], [place] [address]  Acetaminophen level     Status: Abnormal   Collection Time: 11/05/20  9:56 PM  Result Value Ref Range   Acetaminophen (Tylenol), Serum <10 (L) 10 - 30 ug/mL    Comment: (NOTE) Therapeutic concentrations vary significantly. A range of 10-30 ug/mL  may be an effective concentration for many patients. However, some  are best treated at concentrations outside of this range. Acetaminophen concentrations >150 ug/mL at 4 hours after ingestion  and >50 ug/mL at 12 hours after ingestion are often associated with  toxic reactions.  Performed at [facility], [address] [place], [place] [address]   cbc     Status: Abnormal   Collection Time: 11/05/20  9:56 PM  Result Value Ref Range   WBC 7.4 4.0 - 10.5 K/uL   RBC 5.41 4.22 - 5.81 MIL/uL   Hemoglobin 15.2 13.0 - 17.0 g/dL   HCT 45.1 39.0 - 52.0 %   MCV 83.4 80.0 - 100.0 fL    MCH 28.1 26.0 - 34.0 pg   MCHC 33.7 30.0 - 36.0 g/dL   RDW 17.8 (H) 11.5 - 15.5 %   Platelets 258 150 - 400 K/uL   nRBC 0.0 0.0 - 0.2 %    Comment: Performed at [facility], [address], [place], [place] [address]  Urine Drug Screen, Qualitative     Status: None   Collection Time: 11/05/20  9:56 PM  Result Value Ref Range   Tricyclic, Ur Screen NONE DETECTED NONE DETECTED   Amphetamines, Ur Screen NONE  DETECTED NONE DETECTED   MDMA (Ecstasy)Ur Screen NONE DETECTED NONE DETECTED   Cocaine Metabolite,Ur [place] NONE DETECTED NONE DETECTED   Opiate, Ur Screen NONE DETECTED NONE DETECTED   Phencyclidine (PCP) Ur S NONE DETECTED NONE DETECTED   Cannabinoid 50 Ng, Ur [place] NONE DETECTED NONE DETECTED   Barbiturates, Ur Screen NONE DETECTED NONE DETECTED   Benzodiazepine, Ur Scrn NONE DETECTED NONE DETECTED   Methadone Scn, Ur NONE DETECTED NONE DETECTED    Comment: (NOTE) Tricyclics + metabolites, urine    Cutoff 1000 ng/mL Amphetamines + metabolites, urine  Cutoff 1000 ng/mL MDMA (Ecstasy), urine              Cutoff 500 ng/mL Cocaine Metabolite, urine          Cutoff 300 ng/mL Opiate + metabolites, urine        Cutoff 300 ng/mL Phencyclidine (PCP), urine         Cutoff 25 ng/mL Cannabinoid, urine                 Cutoff 50 ng/mL Barbiturates + metabolites, urine  Cutoff 200 ng/mL Benzodiazepine, urine              Cutoff 200 ng/mL Methadone, urine                   Cutoff 300 ng/mL  The urine drug screen provides only a preliminary, unconfirmed analytical test result and should not be used for non-medical purposes. Clinical consideration and professional judgment should be applied to any positive drug screen result due to possible interfering substances. A more specific alternate chemical method must be used in order to obtain a confirmed analytical result. Gas chromatography / mass spectrometry (GC/MS) is the preferred confirm atory method. Performed at [facility],  [address] [place], [place] [address]   Resp Panel by RT-PCR (Flu A&B, Covid) Nasopharyngeal Swab     Status: None   Collection Time: 11/06/20  2:13 PM   Specimen: Nasopharyngeal Swab; Nasopharyngeal(NP) swabs in vial transport medium  Result Value Ref Range   SARS Coronavirus 2 by RT PCR NEGATIVE NEGATIVE    Comment: (NOTE) SARS-CoV-2 target nucleic acids are NOT DETECTED.  The SARS-CoV-2 RNA is generally detectable in upper respiratory specimens during the acute phase of infection. The lowest concentration of SARS-CoV-2 viral copies this assay can detect is 138 copies/mL. A negative result does not preclude SARS-Cov-2 infection and should not be used as the sole basis for treatment or other patient management decisions. A negative result may occur with  improper specimen collection/handling, submission of specimen other than nasopharyngeal swab, presence of viral mutation(s) within the areas targeted by this assay, and inadequate number of viral copies(<138 copies/mL). A negative result must be combined with clinical observations, patient history, and epidemiological information. The expected result is Negative.  Fact Sheet for Patients:  EntrepreneurPulse.com.au  Fact Sheet for Healthcare Providers:  IncredibleEmployment.be  This test is no t yet approved or cleared by the [place] FDA and  has been authorized for detection and/or diagnosis of SARS-CoV-2 by FDA under an Emergency Use Authorization (EUA). This EUA will remain  in effect (meaning this test can be used) for the duration of the COVID-19 declaration under Section 564(b)(1) of the Act, 21 U.S.C.section 360bbb-3(b)(1), unless the authorization is terminated  or revoked sooner.       Influenza A by PCR NEGATIVE NEGATIVE   Influenza B by PCR NEGATIVE NEGATIVE    Comment: (NOTE) The  Xpert Xpress SARS-CoV-2/FLU/RSV plus assay is intended as an aid in the diagnosis of  influenza from Nasopharyngeal swab specimens and should not be used as a sole basis for treatment. Nasal washings and aspirates are unacceptable for Xpert Xpress SARS-CoV-2/FLU/RSV testing.  Fact Sheet for Patients: EntrepreneurPulse.com.au  Fact Sheet for Healthcare Providers: IncredibleEmployment.be  This test is not yet approved or cleared by the [place] FDA and has been authorized for detection and/or diagnosis of SARS-CoV-2 by FDA under an Emergency Use Authorization (EUA). This EUA will remain in effect (meaning this test can be used) for the duration of the COVID-19 declaration under Section 564(b)(1) of the Act, 21 U.S.C. section 360bbb-3(b)(1), unless the authorization is terminated or revoked.  Performed at [facility], [address] [place], [place] [address]   Ethanol     Status: None   Collection Time: 11/06/20  8:34 PM  Result Value Ref Range   Alcohol, Ethyl (B) <10 <10 mg/dL    Comment: (NOTE) Lowest detectable limit for serum alcohol is 10 mg/dL.  For medical purposes only. Performed at [facility], [address] [place], [place] [address]     Current Facility-Administered Medications  Medication Dose Route Frequency Provider Last Rate Last Admin  . FLUoxetine (PROZAC) capsule 80 mg  80 mg Oral Daily Ward, Kristen N, DO   80 mg at 11/07/20 0841  . gabapentin (NEURONTIN) tablet 600 mg  600 mg Oral TID Ward, Kristen N, DO   600 mg at 11/07/20 1062  . hydrOXYzine (ATARAX/VISTARIL) tablet 25 mg  25 mg Oral Q8H PRN Ward, Delice Bison, DO      . levothyroxine (SYNTHROID) tablet 125 mcg  125 mcg Oral Q0600 Ward, Kristen N, DO   125 mcg at 11/07/20 0841  . LORazepam (ATIVAN) injection 0-4 mg  0-4 mg Intravenous Q6H Ward, Kristen N, DO       Or  . LORazepam (ATIVAN) tablet 0-4 mg  0-4 mg Oral Q6H Ward, Kristen N, DO      . [START ON 11/08/2020] LORazepam (ATIVAN) injection 0-4 mg  0-4 mg Intravenous  Q12H Ward, Kristen N, DO       Or  . Derrill Memo ON 11/08/2020] LORazepam (ATIVAN) tablet 0-4 mg  0-4 mg Oral Q12H Ward, Kristen N, DO      . pantoprazole (PROTONIX) EC tablet 40 mg  40 mg Oral Q1200 Ward, Kristen N, DO   40 mg at 11/06/20 1155  . prazosin (MINIPRESS) capsule 1 mg  1 mg Oral QHS Ward, Kristen N, DO   1 mg at 11/07/20 0130  . QUEtiapine (SEROQUEL) tablet 200 mg  200 mg Oral QHS Ward, Kristen N, DO   200 mg at 11/07/20 0130  . temazepam (RESTORIL) capsule 15 mg  15 mg Oral QHS Ward, Kristen N, DO   15 mg at 11/07/20 0130  . thiamine tablet 100 mg  100 mg Oral Daily Ward, Kristen N, DO   100 mg at 11/07/20 6948   Or  . thiamine (B-1) injection 100 mg  100 mg Intravenous Daily Ward, Kristen N, DO      . traZODone (DESYREL) tablet 100 mg  100 mg Oral QHS PRN Ward, Kristen N, DO   100 mg at 11/07/20 0130   Current Outpatient Medications  Medication Sig Dispense Refill  . FLUoxetine (PROZAC) 40 MG capsule Take 2 capsules (80 mg total) by mouth daily. 60 capsule 1  . gabapentin (NEURONTIN) 600 MG tablet Take 1 tablet (600 mg  total) by mouth 3 (three) times daily. 30 tablet 1  . hydrOXYzine (ATARAX/VISTARIL) 25 MG tablet Take 1 tablet (25 mg total) by mouth every 8 (eight) hours as needed for itching. 90 tablet 1  . levothyroxine (SYNTHROID) 125 MCG tablet Take 1 tablet (125 mcg total) by mouth daily at 6 (six) AM. 30 tablet 1  . pantoprazole (PROTONIX) 40 MG tablet Take 1 tablet (40 mg total) by mouth daily at 12 noon. 30 tablet 1  . prazosin (MINIPRESS) 1 MG capsule Take 1 capsule (1 mg total) by mouth at bedtime. 30 capsule 1  . QUEtiapine (SEROQUEL) 200 MG tablet Take 1 tablet (200 mg total) by mouth at bedtime. 30 tablet 1  . temazepam (RESTORIL) 15 MG capsule Take 1 capsule (15 mg total) by mouth at bedtime. 30 capsule 1  . traZODone (DESYREL) 50 MG tablet Take 2 tablets (100 mg total) by mouth at bedtime as needed for sleep. 60 tablet 1    Musculoskeletal: Strength & Muscle Tone:  within normal limits Gait & Station: normal Patient leans: N/A            Psychiatric Specialty Exam:  Presentation  General Appearance: No data recorded Eye Contact:No data recorded Speech:No data recorded Speech Volume:No data recorded Handedness:No data recorded  Mood and Affect  Mood:No data recorded Affect:No data recorded  Thought Process  Thought Processes:No data recorded Descriptions of Associations:No data recorded Orientation:No data recorded Thought Content:No data recorded History of Schizophrenia/Schizoaffective disorder:No  Duration of Psychotic Symptoms:Less than six months  Hallucinations:No data recorded Ideas of Reference:No data recorded Suicidal Thoughts:No data recorded Homicidal Thoughts:No data recorded  Sensorium  Memory:No data recorded Judgment:No data recorded Insight:No data recorded  Executive Functions  Concentration:No data recorded Attention Span:No data recorded Recall:No data recorded Fund of Knowledge:No data recorded Language:No data recorded  Psychomotor Activity  Psychomotor Activity:No data recorded  Assets  Assets:No data recorded  Sleep  Sleep:No data recorded  Physical Exam: Physical Exam Vitals and nursing note reviewed.  Constitutional:      Appearance: Normal appearance.  HENT:     Head: Normocephalic and atraumatic.     Mouth/Throat:     Pharynx: Oropharynx is clear.  Eyes:     Pupils: Pupils are equal, round, and reactive to light.  Cardiovascular:     Rate and Rhythm: Normal rate and regular rhythm.  Pulmonary:     Effort: Pulmonary effort is normal.     Breath sounds: Normal breath sounds.  Abdominal:     General: Abdomen is flat.     Palpations: Abdomen is soft.  Musculoskeletal:        General: Normal range of motion.  Skin:    General: Skin is warm and dry.  Neurological:     General: No focal deficit present.     Mental Status: He is alert. Mental status is at baseline.      Motor: Tremor present.  Psychiatric:        Mood and Affect: Mood normal.        Thought Content: Thought content normal.    Review of Systems  Constitutional: Negative.   HENT: Negative.   Eyes: Negative.   Respiratory: Negative.   Cardiovascular: Negative.   Gastrointestinal: Negative.   Musculoskeletal: Negative.   Skin: Negative.   Neurological: Negative.   Psychiatric/Behavioral: Positive for depression, substance abuse and suicidal ideas. The patient is nervous/anxious.    Blood pressure (!) 132/101, pulse 100, temperature 97.9 F (36.6 C), temperature source  Oral, resp. rate 18, SpO2 94 %. There is no height or weight on file to calculate BMI.  Treatment Plan Summary: Medication management and Plan Appropriate for admission.  Patient continues to endorse suicidal ideation and anxiety and depression.  Physically stable although with some ongoing detox needs.  Patient is agreeable to plan for inpatient hospitalization.  Case reviewed with TTS and ER physician and inpatient treatment team.  Orders completed.  Disposition: Recommend psychiatric Inpatient admission when medically cleared. Supportive therapy provided about ongoing stressors.  Alethia Berthold, MD 11/07/2020 11:06 AM

## 2020-11-07 NOTE — ED Notes (Signed)
IVC/pending admission to BMU by [place].

## 2020-11-07 NOTE — Tx Team (Signed)
Initial Treatment Plan 11/07/2020 3:53 PM Ivan Hamilton QZY:[id number]   PATIENT STRESSORS: Financial difficulties Substance abuse   PATIENT STRENGTHS: Capable of independent living Motivation for treatment/growth Supportive family/friends   PATIENT IDENTIFIED PROBLEMS: Alcohol abuse                     DISCHARGE CRITERIA:  Improved stabilization in mood, thinking, and/or behavior Motivation to continue treatment in a less acute level of care  PRELIMINARY DISCHARGE PLAN: Outpatient therapy  Return to previous living arrangements  PATIENT/FAMILY INVOLVEMENT: This treatment plan has been presented to and reviewed with the patient, Ivan Hamilton. The patient has been given the opportunity to ask questions and make suggestions.  Sunday Spillers, RN 11/07/2020, 3:53 PM

## 2020-11-07 NOTE — Progress Notes (Signed)
Patient was cooperative with admission assessment. Patient stated that on Saturday, he had an argument with his ex-wife over finances.  This led to him drinking a handle of hard liquor, until he passed out. Patient does not remember what he did but fears it was bad. He states he woke up and remembers feeling bad and wanting to kill himself. Patient drinks several shots of liquor daily, but usually not to the point of blacking out. Patient states that he feels he needs more help inpatient because he is unable to stop drinking. He states that he used to go to [facility], but would go to the [facility] store right after each meeting. Patient states that recently, he hasn't sleeping well and he was having bad dreams recently due to PTSD from his time in the [employer]. Patient also states that he sometimes hears voices and sees shadows. However, patient denies SI, HI, and AVH at the time of admission assessment.   Patient currently lives with ex-wife and his daughter, who are helping him and are his main support system.  Patient remains safe on the unit at this time and q15 min safety checks are maintained. Support and encouragement is provided.

## 2020-11-08 DIAGNOSIS — F333 Major depressive disorder, recurrent, severe with psychotic symptoms: Secondary | ICD-10-CM | POA: Diagnosis not present

## 2020-11-08 LAB — LIPID PANEL
Cholesterol: 167 mg/dL (ref 0–200)
HDL: 90 mg/dL (ref >40)
LDL Cholesterol: 59 mg/dL (ref 0–99)
Total CHOL/HDL Ratio: 1.9 RATIO
Triglycerides: 89 mg/dL (ref <150)
VLDL: 18 mg/dL (ref 0–40)

## 2020-11-08 LAB — HEMOGLOBIN A1C
Hgb A1c MFr Bld: 5.3 % (ref 4.8–5.6)
Mean Plasma Glucose: 105.41 mg/dL

## 2020-11-08 MED ORDER — ACAMPROSATE CALCIUM 333 MG PO TBEC
666.0000 mg | DELAYED_RELEASE_TABLET | Freq: Three times a day (TID) | ORAL | Status: DC
  Administered 2020-11-08 – 2020-11-11 (×9): 666 mg via ORAL
  Filled 2020-11-08 (×11): qty 2

## 2020-11-08 NOTE — Plan of Care (Signed)
Patient stated to this writer that he is feeling better and is hopeful to go to rehab  Problem: Education: Goal: Emotional status will improve Outcome: Progressing Goal: Mental status will improve Outcome: Progressing

## 2020-11-08 NOTE — Progress Notes (Signed)
Patient calm and cooperative during assessment denying SI/HI/AVH. Patient endorses anxiety and stated to this writer that he has to many triggers at home and wants to go to rehab. Patient given education, support and encouragement to be active in his treatment plan. Patient observed interacting appropriately with staff and peers on the unit. Patient being monitored Q 15 minutes for safety per unit protocol. Pt remains safe on the unit.

## 2020-11-08 NOTE — Progress Notes (Addendum)
[facility] MD Progress Note  11/08/2020 1:38 PM Ivan Hamilton  MRN:  [id number]  CC "I need help."  Subjective:  55 year old male with MDD, PTSD, and alcohol use disorder presenting for suicidal statements. No acute events overnight, medication compliant, attending to ADLs. He is seen one-on-one today. He notes that his depression and anxiety are both an 8/10 right now. He denies any side effects to restarting medications. He notes he is not having any active suicidal ideations here in the hospital. However, he fears that if he leaves he will immediately start drinking and become suicidal again. He continues to have intense cravings for alcohol despite tremendous social consequences in his life. He continues to desire residential treatment for alcohol use. He is also willing to restart Acamprosate to assist with cravings. Time also spent discussing his PTSD symptoms from war again today and providing supportive counseling. He requests that I call his exwife today to alert her he is in the hospital, and to provide her with his patient code.   Call placed to [facility] [phone]. No answer, voicemail left with call back number and patient code per request.   Principal Problem: Severe recurrent major depressive disorder with psychotic features ([place]) Diagnosis: Principal Problem:   Severe recurrent major depressive disorder with psychotic features ([place]) Active Problems:   Chronic post-traumatic stress disorder (PTSD)   Alcohol use disorder, severe, dependence ([place])   Tobacco abuse   HTN (hypertension)   Generalized anxiety disorder  Total Time spent with patient: 30 minutes  Past Psychiatric History: See H&P  Past Medical History:  Past Medical History:  Diagnosis Date  . Alcohol withdrawal ([place])   . B12 deficiency   . Chronic pain   . CVA (cerebral vascular accident) ([place])   . Hypertension   . Hypothyroidism   . Varicose veins     Past Surgical History:  Procedure Laterality  Date  . CHOLECYSTECTOMY    . ESOPHAGOGASTRODUODENOSCOPY (EGD) WITH PROPOFOL N/A 10/24/2017   Procedure: ESOPHAGOGASTRODUODENOSCOPY (EGD) WITH PROPOFOL;  Surgeon: [name], MD;  Location: [facility] ENDOSCOPY;  Service: Endoscopy;  Laterality: N/A;  . ESOPHAGOGASTRODUODENOSCOPY (EGD) WITH PROPOFOL N/A 07/12/2019   Procedure: ESOPHAGOGASTRODUODENOSCOPY (EGD) WITH PROPOFOL;  Surgeon: Jonathon Bellows, MD;  Location: [facility] ENDOSCOPY;  Service: Gastroenterology;  Laterality: N/A;  . LEG SURGERY     Family History: History reviewed. No pertinent family history. Family Psychiatric  History: See H&P Social History:  Social History   Substance and Sexual Activity  Alcohol Use Yes  . Alcohol/week: 21.0 standard drinks  . Types: 21 Shots of liquor per week   Comment: daily- 1 pint of vodka     Social History   Substance and Sexual Activity  Drug Use No    Social History   Socioeconomic History  . Marital status: Married    Spouse name: Not on file  . Number of children: Not on file  . Years of education: Not on file  . Highest education level: Not on file  Occupational History  . Not on file  Tobacco Use  . Smoking status: Never Smoker  . Smokeless tobacco: Never Used  Vaping Use  . Vaping Use: Never used  Substance and Sexual Activity  . Alcohol use: Yes    Alcohol/week: 21.0 standard drinks    Types: 21 Shots of liquor per week    Comment: daily- 1 pint of vodka  . Drug use: No  . Sexual activity: Not on file  Other Topics Concern  . Not on  file  Social History Narrative  . Not on file   Social Determinants of Health   Financial Resource Strain: Not on file  Food Insecurity: Not on file  Transportation Needs: Not on file  Physical Activity: Not on file  Stress: Not on file  Social Connections: Not on file   Additional Social History:                         Sleep: Fair  Appetite:  Fair  Current Medications: Current Facility-Administered Medications   Medication Dose Route Frequency Provider Last Rate Last Admin  . acamprosate (CAMPRAL) tablet 666 mg  666 mg Oral TID WC Salley Scarlet, MD      . acetaminophen (TYLENOL) tablet 650 mg  650 mg Oral Q6H PRN Clapacs, John T, MD      . alum & mag hydroxide-simeth (MAALOX/MYLANTA) 200-200-20 MG/5ML suspension 30 mL  30 mL Oral Q4H PRN Clapacs, John T, MD      . FLUoxetine (PROZAC) capsule 80 mg  80 mg Oral Daily Clapacs, Madie Reno, MD   80 mg at 11/08/20 0811  . gabapentin (NEURONTIN) capsule 800 mg  800 mg Oral TID Clapacs, John T, MD   800 mg at 11/08/20 1217  . hydrOXYzine (ATARAX/VISTARIL) tablet 25 mg  25 mg Oral Q8H PRN Salley Scarlet, MD   25 mg at 11/07/20 1707  . levothyroxine (SYNTHROID) tablet 125 mcg  125 mcg Oral Q0600 Clapacs, Madie Reno, MD   125 mcg at 11/08/20 1610  . lisinopril (ZESTRIL) tablet 5 mg  5 mg Oral Daily Clapacs, Madie Reno, MD   5 mg at 11/08/20 0817  . LORazepam (ATIVAN) injection 0-4 mg  0-4 mg Intravenous Q12H Clapacs, Madie Reno, MD       Or  . LORazepam (ATIVAN) tablet 0-4 mg  0-4 mg Oral Q12H Clapacs, John T, MD   1 mg at 11/08/20 1030  . magnesium hydroxide (MILK OF MAGNESIA) suspension 30 mL  30 mL Oral Daily PRN Clapacs, John T, MD      . pantoprazole (PROTONIX) EC tablet 40 mg  40 mg Oral Q1200 Clapacs, Madie Reno, MD   40 mg at 11/08/20 1030  . prazosin (MINIPRESS) capsule 1 mg  1 mg Oral QHS Clapacs, Madie Reno, MD   1 mg at 11/07/20 2125  . QUEtiapine (SEROQUEL) tablet 200 mg  200 mg Oral QHS Clapacs, John T, MD   200 mg at 11/07/20 2125  . temazepam (RESTORIL) capsule 15 mg  15 mg Oral QHS Clapacs, Madie Reno, MD   15 mg at 11/07/20 2125  . thiamine tablet 100 mg  100 mg Oral Daily Clapacs, John T, MD   100 mg at 11/08/20 9604   Or  . thiamine (B-1) injection 100 mg  100 mg Intravenous Daily Clapacs, John T, MD      . traZODone (DESYREL) tablet 100 mg  100 mg Oral QHS PRN Clapacs, Madie Reno, MD   100 mg at 11/07/20 2125    Lab Results:  Results for orders placed or performed  during the hospital encounter of 11/07/20 (from the past 48 hour(s))  Hemoglobin A1c     Status: None   Collection Time: 11/08/20  6:50 AM  Result Value Ref Range   Hgb A1c MFr Bld 5.3 4.8 - 5.6 %    Comment: (NOTE) Pre diabetes:          5.7%-6.4%  Diabetes:              >  6.4%  Glycemic control for   <7.0% adults with diabetes    Mean Plasma Glucose 105.41 mg/dL    Comment: Performed at [facility] [address], [place], [place] [address]  Lipid panel     Status: None   Collection Time: 11/08/20  6:50 AM  Result Value Ref Range   Cholesterol 167 0 - 200 mg/dL   Triglycerides 89 <150 mg/dL   HDL 90 >40 mg/dL   Total CHOL/HDL Ratio 1.9 RATIO   VLDL 18 0 - 40 mg/dL   LDL Cholesterol 59 0 - 99 mg/dL    Comment:        Total Cholesterol/HDL:CHD Risk Coronary Heart Disease Risk Table                     Men   Women  1/2 Average Risk   3.4   3.3  Average Risk       5.0   4.4  2 X Average Risk   9.6   7.1  3 X Average Risk  23.4   11.0        Use the calculated Patient Ratio above and the CHD Risk Table to determine the patient's CHD Risk.        ATP III CLASSIFICATION (LDL):  <100     mg/dL   Optimal  100-129  mg/dL   Near or Above                    Optimal  130-159  mg/dL   Borderline  160-189  mg/dL   High  >190     mg/dL   Very High Performed at [facility], [address] [place], [place] [address]     Blood Alcohol level:  Lab Results  Component Value Date   ETH <10 11/06/2020   ETH 322 (HH) 64/40/3474    Metabolic Disorder Labs: Lab Results  Component Value Date   HGBA1C 5.3 11/08/2020   MPG 105.41 11/08/2020   MPG 105 05/12/2020   No results found for: PROLACTIN Lab Results  Component Value Date   CHOL 167 11/08/2020   TRIG 89 11/08/2020   HDL 90 11/08/2020   CHOLHDL 1.9 11/08/2020   VLDL 18 11/08/2020   LDLCALC 59 11/08/2020   LDLCALC 103 (H) 05/12/2020    Physical Findings: AIMS: Facial and Oral Movements Muscles of  Facial Expression: None, normal Lips and Perioral Area: None, normal Jaw: None, normal Tongue: None, normal,Extremity Movements Upper (arms, wrists, hands, fingers): None, normal Lower (legs, knees, ankles, toes): None, normal, Trunk Movements Neck, shoulders, hips: None, normal, Overall Severity Severity of abnormal movements (highest score from questions above): None, normal Incapacitation due to abnormal movements: None, normal Patient's awareness of abnormal movements (rate only patient's report): No Awareness, Dental Status Current problems with teeth and/or dentures?: No Does patient usually wear dentures?: No  CIWA:  CIWA-Ar Total: 7 COWS:     Musculoskeletal: Strength & Muscle Tone: within normal limits Gait & Station: normal Patient leans: N/A  Psychiatric Specialty Exam:  Presentation  General Appearance: Casual  Eye Contact:Good  Speech:Normal Rate  Speech Volume:Normal  Handedness:Right   Mood and Affect  Mood:Depressed; Dysphoric  Affect:Congruent; Tearful   Thought Process  Thought Processes:Coherent  Descriptions of Associations:Intact  Orientation:Full (Time, Place and Person)  Thought Content:Perseveration  History of Schizophrenia/Schizoaffective disorder:No  Duration of Psychotic Symptoms:Less than six months  Hallucinations:Hallucinations: Auditory  Ideas of Reference:None  Suicidal Thoughts:Suicidal Thoughts:  Yes, Passive SI Passive Intent and/or Plan: Without Access to Means  Homicidal Thoughts:Homicidal Thoughts: No   Sensorium  Memory:Immediate Fair; Remote Fair; Recent Fair  Judgment:Poor  Insight:Present   Executive Functions  Concentration:Fair  Attention Span:Fair  [place]   Psychomotor Activity  Psychomotor Activity:Psychomotor Activity: Normal   Assets  Assets:Communication Skills; Desire for Improvement; Financial Resources/Insurance; Housing; Resilience;  Social Support   Sleep  Sleep:Sleep: Fair Number of Hours of Sleep: 8    Physical Exam: Physical Exam ROS Blood pressure 120/88, pulse 97, temperature 97.8 F (36.6 C), temperature source Oral, resp. rate 17, height 5\' 5"  (1.651 m), weight 68.9 kg, SpO2 96 %. Body mass index is 25.29 kg/m.   Treatment Plan Summary: Daily contact with patient to assess and evaluate symptoms and progress in treatment and Medication management 1-3) MDD, recurrent severe with psychotic features; chronic PTSD; GAD - Continue Prozac 80 mg daily for mood, seroquel 200 mg QHS for hallucinations, prazosin 1 mg QHS for nightmares, Restoril 15 mg QHS for sleep - Lipid panel and hemoglobin A1c WNL   4) Alcohol Use Disorder, severe- established problem, unstable - Gabapentin 800 mg TID - Acamprosate 666 mg TID  - CIWA protocol - MVI, folate, thiamine, check vitamin b1 levels  - Requesting residential substance abuse treatment  5) HTN- established problem - Continue lisinipril 5 mg daily  Salley Scarlet, MD 11/08/2020, 1:38 PM

## 2020-11-08 NOTE — Plan of Care (Signed)
Patient rated his depression and anxiety 8/10. Had Ativan x1 as per CIWA. Patient visible in the milieu. Appropriate with staff & peers. Denies SI,HI and AVH. Patient verbalized the seriousness of his addiction.Compliant with medications. Appetite and energy level good. Support and encouragement given.

## 2020-11-08 NOTE — BHH Suicide Risk Assessment (Addendum)
[facility] INPATIENT:  Family/Significant Other Suicide Prevention Education  Suicide Prevention Education:  Contact Attempts: Roberts Gaudy, ex-wife (name of family member/significant other) has been identified by the patient as the family member/significant other with whom the patient will be residing, and identified as the person(s) who will aid the patient in the event of a mental health crisis.  With written consent from the patient, two attempts were made to provide suicide prevention education, prior to and/or following the patient's discharge.  We were unsuccessful in providing suicide prevention education.  A suicide education pamphlet was given to the patient to share with family/significant other.  Date and time of first attempt: 11/08/20/3:55pm Date and time of second attempt:11/09/20/10:30am  Charlotte Brafford A [place] 11/08/2020, 3:59 PM

## 2020-11-08 NOTE — BHH Group Notes (Signed)
LCSW Group Therapy Note     11/08/2020 4:26 PM     Type of Therapy/Topic:  Group Therapy:  Feelings about Diagnosis     Participation Level:  Active     Description of Group:   This group will allow patients to explore their thoughts and feelings about diagnoses they have received. Patients will be guided to explore their level of understanding and acceptance of these diagnoses. Facilitator will encourage patients to process their thoughts and feelings about the reactions of others to their diagnosis and will guide patients in identifying ways to discuss their diagnosis with significant others in their lives. This group will be process-oriented, with patients participating in exploration of their own experiences, giving and receiving support, and processing challenge from other group members.        Therapeutic Goals:  1.    Patient will demonstrate understanding of diagnosis as evidenced by identifying two or more symptoms of the disorder  2.    Patient will be able to express two feelings regarding the diagnosis  3.    Patient will demonstrate their ability to communicate their needs through discussion and/or role play     Summary of Patient Progress: Patient was present for the entirety of the group session. Patient was an active listener and participated in the topic of discussion, provided helpful advice to others, and added nuance to topic of conversation.  Pt discussed how his diagnosis of PTSD was helpful to him because it provided answers to some of his very intense symptoms. He stated that he wants to work on his alcohol use and talk about his internal feelings because it helps him "feel lighter" and in turn makes him want to drink less.    Therapeutic Modalities:   Cognitive Behavioral Therapy  Brief Therapy  Feelings Identification    Anh Bigos [place], MSW, [place]  11/08/2020 4:26 PM

## 2020-11-09 DIAGNOSIS — F333 Major depressive disorder, recurrent, severe with psychotic symptoms: Secondary | ICD-10-CM | POA: Diagnosis not present

## 2020-11-09 MED ORDER — TEMAZEPAM 7.5 MG PO CAPS
7.5000 mg | ORAL_CAPSULE | Freq: Every day | ORAL | Status: DC
Start: 2020-11-09 — End: 2020-11-10
  Administered 2020-11-09: 7.5 mg via ORAL
  Filled 2020-11-09: qty 1

## 2020-11-09 MED ORDER — QUETIAPINE FUMARATE 25 MG PO TABS
25.0000 mg | ORAL_TABLET | Freq: Two times a day (BID) | ORAL | Status: DC | PRN
  Administered 2020-11-09: 25 mg via ORAL
  Filled 2020-11-09: qty 1

## 2020-11-09 NOTE — BHH Counselor (Signed)
CSW contacted [facility] [phone] and provided pt information for referral. CSW will receive call back from facility regarding pt transfer, placement and care coordination to facility. CSW will follow up with [facility] to get details and next steps regarding pt.   Janece Laidlaw [place], MSW, LCSW-A 4/20/20224:28 PM

## 2020-11-09 NOTE — BHH Counselor (Signed)
Adult Comprehensive Assessment  Patient ID: Ivan Hamilton, male   DOB: 18-Aug-1965, 55 y.o.   MRN: [id number]  Information Source: Information source: Patient  Current Stressors:  Patient states their primary concerns and needs for treatment are:: "to meet people and talk to change my life" Patient states their goals for this hospitilization and ongoing recovery are:: "find a way to stop drinking" Educational / Learning stressors: Pt denies Employment / Job issues: Pt denies Family Relationships: "stress with my ex-wifePublishing copy / Lack of resources (include bankruptcy): Pt denies Housing / Lack of housing: Pt denies Physical health (include injuries & life threatening diseases): "pain in back and issues with leg being numb" Social relationships: Pt denies Substance abuse: Alcohol use disorder hx Bereavement / Loss: Pt denies  Living/Environment/Situation:  Living Arrangements: Alone,Other (Comment) (Pt states that his ex-wife visits often to help with medication management and paying bills) Living conditions (as described by patient or guardian): "ok" Who else lives in the home?: Pt states that his ex-wife and daughter occasionally stay with him How long has patient lived in current situation?: Per last pt assessment, about 13 years What is atmosphere in current home: Comfortable ("good, i like my house")  Family History:  Marital status: Divorced Number of Years Married: 72 What types of issues is patient dealing with in the relationship?: Pt states that his family does Are you sexually active?: No What is your sexual orientation?: "women" Does patient have children?: Yes How many children?: 2 (2 daughters) How is patient's relationship with their children?: Per last assessment, "good relationships"  Childhood History:  By whom was/is the patient raised?: Both parents Description of patient's relationship with caregiver when they were a child: Per last pt assessment, good  relationship Patient's description of current relationship with people who raised him/her: Per last pt's assessment, Mother lives in [place], father passed away a few years ago How were you disciplined when you got in trouble as a child/adolescent?: None reported Does patient have siblings?: Yes Number of Siblings: 8 Description of patient's current relationship with siblings: 5 brothers, 3 sisters, "ok" relationships Did patient suffer any verbal/emotional/physical/sexual abuse as a child?: No Did patient suffer from severe childhood neglect?: No Has patient ever been sexually abused/assaulted/raped as an adolescent or adult?: No Was the patient ever a victim of a crime or a disaster?: No Witnessed domestic violence?: No Has patient been affected by domestic violence as an adult?: No  Education:  Highest grade of school patient has completed: Pt reports he graduted high school in [place] per last pt assessment Currently a student?: No (Pt stated that he has been trying to get his GED the last few years) Learning disability?: No  Employment/Work Situation:   Employment situation: On disability Why is patient on disability: Mental health and injuries How long has patient been on disability: "a little while" Patient's job has been impacted by current illness: No What is the longest time patient has a held a job?: 16 years Where was the patient employed at that time?: Psychologist, occupational Has patient ever been in the [employer]?: Yes (Describe in comment) (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 Per last pt assessment pt  reports drinking a pint of vodka daily but only dirnkin a few days a week. Pt reported that anger and stress are significant stressors for his alcohol use. If attempted suicide, did drugs/alcohol play a role in this?: Yes Alcohol/Substance Abuse Treatment Hx: Past Tx, Inpatient,Past Tx, Outpatient,Attends AA/NA (Pt states that he stopped attending AA in the area because he felt discrimnated against.) If yes, describe treatment: [facility], [facility] (a few times), [facility] (residential treatment) and inpatient substance treatment center in [place], pt could not recall the name (was not [facility]) Has alcohol/substance abuse ever caused legal problems?: Yes (Pt is currently on probation)  Social Support System:   Patient's Community Support System: Fair Astronomer System: ex-wife, daughters Type of faith/religion: Pt denies How does patient's faith help to cope with current illness?: Pt denies  Leisure/Recreation:   Do You Have Hobbies?: Yes Leisure and Hobbies: Per last pt assessment, pt states that enjoys doing things around the house  Strengths/Needs:   What is the patient's perception of their strengths?: Pt states that he wants to "be better" Patient states they can use these personal strengths during their treatment to contribute to their recovery: Pt reports that he likes to talk things out and keep his mind occupied Patient states these barriers may affect/interfere with their treatment: Pt denies Patient states these barriers may affect their return to the community: Pt denies  Discharge Plan:   Currently receiving community mental health services: No Patient states concerns and preferences for aftercare planning are: Pt states that he has a preference for in-person inpatient treatment Patient states they will know when they are safe and ready for discharge when: "When I'm just feeling good" Does patient have access to transportation?: No Plan for no access to  transportation at discharge: CSW will assist pt with obtaining transporation Will patient be returning to same living situation after discharge?: Yes  Summary/Recommendations:   Summary and Recommendations (to be completed by the evaluator): Patient [place] a 55 year [place] [place] [facility] He reports that he receives SSDI and Medicare and is currently unemployed. He presents to the hospital following suicidal ideation and hostile behavior after drinking a bottle of tequila though he has been sober for the last couple of months. He has a primary diagnosis of Major Depressive Disorder. Pt has history of severe alcohol use disoder and post traumatic stress disorder. Pt states that he can become stressed about how his ex-wife takes care of his expenses and that it prompts him to problematically use alcohol. Pt states that he is interested in inperson substance use treatment and finding a therapist he can see in-person. Recommendations include: crisis stabilization, therapeutic milieu, encourage group attendance and participation, medication management for detox/mood stabilization and development of comprehensive mental wellness/sobriety plan.  Denali Sharma A [place]. 11/09/2020

## 2020-11-09 NOTE — Progress Notes (Signed)
[facility] MD Progress Note  11/09/2020 1:28 PM Ivan Hamilton  MRN:  [id number]  CC "Can I get into rehab somewhere?"  Subjective:  55 year old male with MDD, PTSD, and alcohol use disorder presenting for suicidal statements. No acute events overnight, medication compliant, attending to ADLs. He is seen during treatment team and again one-on-one. His goal is to stay sober, and get into residential treatment. He continues to have a tremendous amount of guilt over his relapse on alcohol. He also notes some anxiety and fear about what he may have done while he was blacked out prior to this admission. He notes he often yells at his exwife and daughter. He was able to speak to his wife briefly yesterday, and he was encouraged that she took his call in general. He continues to endorse PTSD symptoms, depression, and anxiety. He denies any suicidal ideations, homicidal ideations, visual hallucinations, or auditory hallucinations.    Principal Problem: Severe recurrent major depressive disorder with psychotic features ([place]) Diagnosis: Principal Problem:   Severe recurrent major depressive disorder with psychotic features ([place]) Active Problems:   Chronic post-traumatic stress disorder (PTSD)   Alcohol use disorder, severe, dependence ([place])   Tobacco abuse   HTN (hypertension)   Generalized anxiety disorder  Total Time spent with patient: 30 minutes  Past Psychiatric History: See H&P  Past Medical History:  Past Medical History:  Diagnosis Date  . Alcohol withdrawal ([place])   . B12 deficiency   . Chronic pain   . CVA (cerebral vascular accident) ([place])   . Hypertension   . Hypothyroidism   . Varicose veins     Past Surgical History:  Procedure Laterality Date  . CHOLECYSTECTOMY    . ESOPHAGOGASTRODUODENOSCOPY (EGD) WITH PROPOFOL N/A 10/24/2017   Procedure: ESOPHAGOGASTRODUODENOSCOPY (EGD) WITH PROPOFOL;  Surgeon: [name], MD;  Location: [facility] ENDOSCOPY;  Service: Endoscopy;  Laterality: N/A;   . ESOPHAGOGASTRODUODENOSCOPY (EGD) WITH PROPOFOL N/A 07/12/2019   Procedure: ESOPHAGOGASTRODUODENOSCOPY (EGD) WITH PROPOFOL;  Surgeon: Jonathon Bellows, MD;  Location[facility] ENDOSCOPY;  Service: Gastroenterology;  Laterality: N/A;  . LEG SURGERY     Family History: History reviewed. No pertinent family history. Family Psychiatric  History: See H&P Social History:  Social History   Substance and Sexual Activity  Alcohol Use Yes  . Alcohol/week: 21.0 standard drinks  . Types: 21 Shots of liquor per week   Comment: daily- 1 pint of vodka     Social History   Substance and Sexual Activity  Drug Use No    Social History   Socioeconomic History  . Marital status: Married    Spouse name: Not on file  . Number of children: Not on file  . Years of education: Not on file  . Highest education level: Not on file  Occupational History  . Not on file  Tobacco Use  . Smoking status: Never Smoker  . Smokeless tobacco: Never Used  Vaping Use  . Vaping Use: Never used  Substance and Sexual Activity  . Alcohol use: Yes    Alcohol/week: 21.0 standard drinks    Types: 21 Shots of liquor per week    Comment: daily- 1 pint of vodka  . Drug use: No  . Sexual activity: Not on file  Other Topics Concern  . Not on file  Social History Narrative  . Not on file   Social Determinants of Health   Financial Resource Strain: Not on file  Food Insecurity: Not on file  Transportation Needs: Not on file  Physical Activity:  Not on file  Stress: Not on file  Social Connections: Not on file   Additional Social History:   Sleep: Fair  Appetite:  Fair  Current Medications: Current Facility-Administered Medications  Medication Dose Route Frequency Provider Last Rate Last Admin  . acamprosate (CAMPRAL) tablet 666 mg  666 mg Oral TID WC Salley Scarlet, MD   666 mg at 11/09/20 1106  . acetaminophen (TYLENOL) tablet 650 mg  650 mg Oral Q6H PRN Clapacs, Madie Reno, MD   650 mg at 11/09/20 1020  . alum  & mag hydroxide-simeth (MAALOX/MYLANTA) 200-200-20 MG/5ML suspension 30 mL  30 mL Oral Q4H PRN Clapacs, John T, MD      . FLUoxetine (PROZAC) capsule 80 mg  80 mg Oral Daily Clapacs, Madie Reno, MD   80 mg at 11/09/20 0820  . gabapentin (NEURONTIN) capsule 800 mg  800 mg Oral TID Clapacs, John T, MD   800 mg at 11/09/20 1105  . hydrOXYzine (ATARAX/VISTARIL) tablet 25 mg  25 mg Oral Q8H PRN Salley Scarlet, MD   25 mg at 11/09/20 0240  . levothyroxine (SYNTHROID) tablet 125 mcg  125 mcg Oral Q0600 Clapacs, Madie Reno, MD   125 mcg at 11/09/20 0656  . lisinopril (ZESTRIL) tablet 5 mg  5 mg Oral Daily Clapacs, Madie Reno, MD   5 mg at 11/09/20 9735  . LORazepam (ATIVAN) injection 0-4 mg  0-4 mg Intravenous Q12H Clapacs, Madie Reno, MD       Or  . LORazepam (ATIVAN) tablet 0-4 mg  0-4 mg Oral Q12H Clapacs, Madie Reno, MD   1 mg at 11/08/20 2124  . magnesium hydroxide (MILK OF MAGNESIA) suspension 30 mL  30 mL Oral Daily PRN Clapacs, John T, MD      . pantoprazole (PROTONIX) EC tablet 40 mg  40 mg Oral Q1200 Clapacs, Madie Reno, MD   40 mg at 11/09/20 1106  . prazosin (MINIPRESS) capsule 1 mg  1 mg Oral QHS Clapacs, John T, MD   1 mg at 11/08/20 2126  . QUEtiapine (SEROQUEL) tablet 200 mg  200 mg Oral QHS Clapacs, John T, MD   200 mg at 11/08/20 2125  . QUEtiapine (SEROQUEL) tablet 25 mg  25 mg Oral BID PRN Salley Scarlet, MD   25 mg at 11/09/20 1105  . temazepam (RESTORIL) capsule 15 mg  15 mg Oral QHS Clapacs, Madie Reno, MD   15 mg at 11/08/20 2126  . thiamine tablet 100 mg  100 mg Oral Daily Clapacs, John T, MD   100 mg at 11/09/20 0820   Or  . thiamine (B-1) injection 100 mg  100 mg Intravenous Daily Clapacs, John T, MD      . traZODone (DESYREL) tablet 100 mg  100 mg Oral QHS PRN Clapacs, Madie Reno, MD   100 mg at 11/07/20 2125    Lab Results:  Results for orders placed or performed during the hospital encounter of 11/07/20 (from the past 48 hour(s))  Hemoglobin A1c     Status: None   Collection Time: 11/08/20  6:50 AM   Result Value Ref Range   Hgb A1c MFr Bld 5.3 4.8 - 5.6 %    Comment: (NOTE) Pre diabetes:          5.7%-6.4%  Diabetes:              >6.4%  Glycemic control for   <7.0% adults with diabetes    Mean Plasma Glucose 105.41 mg/dL  Comment: Performed at [facility], [place] [address] [place], [place] [address]  Lipid panel     Status: None   Collection Time: 11/08/20  6:50 AM  Result Value Ref Range   Cholesterol 167 0 - 200 mg/dL   Triglycerides 89 <150 mg/dL   HDL 90 >40 mg/dL   Total CHOL/HDL Ratio 1.9 RATIO   VLDL 18 0 - 40 mg/dL   LDL Cholesterol 59 0 - 99 mg/dL    Comment:        Total Cholesterol/HDL:CHD Risk Coronary Heart Disease Risk Table                     Men   Women  1/2 Average Risk   3.4   3.3  Average Risk       5.0   4.4  2 X Average Risk   9.6   7.1  3 X Average Risk  23.4   11.0        Use the calculated Patient Ratio above and the CHD Risk Table to determine the patient's CHD Risk.        ATP III CLASSIFICATION (LDL):  <100     mg/dL   Optimal  100-129  mg/dL   Near or Above                    Optimal  130-159  mg/dL   Borderline  160-189  mg/dL   High  >190     mg/dL   Very High Performed at [facility], [address] [place], [place] [address]     Blood Alcohol level:  Lab Results  Component Value Date   ETH <10 11/06/2020   ETH 322 (HH) 38/18/2993    Metabolic Disorder Labs: Lab Results  Component Value Date   HGBA1C 5.3 11/08/2020   MPG 105.41 11/08/2020   MPG 105 05/12/2020   No results found for: PROLACTIN Lab Results  Component Value Date   CHOL 167 11/08/2020   TRIG 89 11/08/2020   HDL 90 11/08/2020   CHOLHDL 1.9 11/08/2020   VLDL 18 11/08/2020   LDLCALC 59 11/08/2020   LDLCALC 103 (H) 05/12/2020    Physical Findings: AIMS: Facial and Oral Movements Muscles of Facial Expression: None, normal Lips and Perioral Area: None, normal Jaw: None, normal Tongue: None, normal,Extremity Movements Upper  (arms, wrists, hands, fingers): None, normal Lower (legs, knees, ankles, toes): None, normal, Trunk Movements Neck, shoulders, hips: None, normal, Overall Severity Severity of abnormal movements (highest score from questions above): None, normal Incapacitation due to abnormal movements: None, normal Patient's awareness of abnormal movements (rate only patient's report): No Awareness, Dental Status Current problems with teeth and/or dentures?: No Does patient usually wear dentures?: No  CIWA:  CIWA-Ar Total: 3 COWS:     Musculoskeletal: Strength & Muscle Tone: within normal limits Gait & Station: normal Patient leans: N/A  Psychiatric Specialty Exam:  Presentation  General Appearance: Casual  Eye Contact:Good  Speech:Normal Rate  Speech Volume:Normal  Handedness:Right   Mood and Affect  Mood:Depressed; Dysphoric; Anxious  Affect:Congruent; Tearful   Thought Process  Thought Processes:Coherent  Descriptions of Associations:Intact  Orientation:Full (Time, Place and Person)  Thought Content:Perseveration  History of Schizophrenia/Schizoaffective disorder:No  Duration of Psychotic Symptoms:Less than six months  Hallucinations:Hallucinations: Auditory  Ideas of Reference:None  Suicidal Thoughts:Suicidal Thoughts: No SI Passive Intent and/or Plan: Without Access to Means  Homicidal Thoughts:Homicidal Thoughts: No   Sensorium  Memory:Immediate Good;  Remote Good; Recent Good  Judgment:Intact  Insight:Present   Executive Functions  Concentration:Good  Attention Span:Good  [place] of Knowledge:Good  Language:Good   Psychomotor Activity  Psychomotor Activity:Psychomotor Activity: Normal   Assets  Assets:Communication Skills; Desire for Improvement; Financial Resources/Insurance; Housing; Resilience; Social Support   Sleep  Sleep:Sleep: Good Number of Hours of Sleep: 8    Physical Exam: Physical Exam  ROS  Blood pressure  112/87, pulse (!) 116, temperature 97.8 F (36.6 C), temperature source Oral, resp. rate 18, height 5\' 5"  (1.651 m), weight 68.9 kg, SpO2 99 %. Body mass index is 25.29 kg/m.   Treatment Plan Summary: Daily contact with patient to assess and evaluate symptoms and progress in treatment and Medication management 1-3) MDD, recurrent severe with psychotic features; chronic PTSD; GAD - Continue Prozac 80 mg daily for mood, seroquel 200 mg QHS for hallucinations, prazosin 1 mg QHS for nightmares - Reduce Restoril 7.5 mg QHS for sleep with plan to taper off and discontinue - Start Seroquel 25 mg BID for continued anxiety and depression  4) Alcohol Use Disorder, severe- established problem, unstable - Gabapentin 800 mg TID - Acamprosate 666 mg TID  - CIWA protocol - MVI, folate, thiamine, check vitamin b1 levels  - Requesting residential substance abuse treatment  5) HTN- established problem - Continue lisinipril 5 mg daily  Salley Scarlet, MD 11/09/2020, 1:28 PM

## 2020-11-09 NOTE — Progress Notes (Signed)
Recreation Therapy Notes  INPATIENT RECREATION TR PLAN  Patient Details Name: Ivan Hamilton MRN: [id number] DOB: [birth year] Today's Date: 11/09/2020  Rec Therapy Plan Is patient appropriate for Therapeutic Recreation?: Yes Treatment times per week: at least 3 Estimated Length of Stay: 5-7 days TR Treatment/Interventions: Group participation (Comment)  Discharge Criteria Pt will be discharged from therapy if:: Discharged Treatment plan/goals/alternatives discussed and agreed upon by:: Patient/family  Discharge Summary     Ivan Hamilton 11/09/2020, 11:51 AM

## 2020-11-09 NOTE — Progress Notes (Signed)
Ivan Hamilton presents as pleasant and cooperative. This morning he complained of anxiety, depression and pain in his lower back radiating towards left leg. He said he had the pain for years and it is normally managed with pain medication. He denies SI, HI, and AVH. His goal is to go to an inpatient alcohol treatment center. He attends groups and is seen interacting safely on the milieu. Will continue to monitor with 15 minute safety checks.

## 2020-11-09 NOTE — Plan of Care (Signed)
D- Patient alert and oriented. Patient presents in a pleasant mood on assessment stating that he was able to get some rest last night, but it doesn't happen like this every night. Patient had complaints of "liver pain", rating it a "7/10", in which he requested PRN medication from this writer. Patient stated that depression and anxiety "stays in me everyday. I don't want to go to the room because I feel depressed back there". Patient did state that right now "I feel happy, I like to talk. That makes me feel good and stop thinking about drinking". Patient denies SI, HI, AVH, at this time, although he does endorse that he saw something moving in his room last night and that he hears voices when at home. However, he has not heard any voices while here in the hospital. Patient had no stated goals, however, he has been visible in the milieu, interacting with other members on the unit. He stated he is going to stay up as long as he can so that he won't be up throughout the night after bedtime medication.  A- Scheduled medications administered to patient, per MD orders. Support and encouragement provided.  Routine safety checks conducted every 15 minutes.  Patient informed to notify staff with problems or concerns.  R- No adverse drug reactions noted. Patient contracts for safety at this time. Patient compliant with medications and treatment plan. Patient receptive, calm, and cooperative. Patient interacts well with others on the unit.  Patient remains safe at this time.  Problem: Education: Goal: Knowledge of [facility] Education information/materials will improve Outcome: Progressing Goal: Emotional status will improve Outcome: Progressing Goal: Mental status will improve Outcome: Progressing Goal: Verbalization of understanding the information provided will improve Outcome: Progressing   Problem: Activity: Goal: Interest or engagement in activities will improve Outcome: Progressing Goal:  Sleeping patterns will improve Outcome: Progressing   Problem: Coping: Goal: Ability to verbalize frustrations and anger appropriately will improve Outcome: Progressing Goal: Ability to demonstrate self-control will improve Outcome: Progressing   Problem: Health Behavior/Discharge Planning: Goal: Identification of resources available to assist in meeting health care needs will improve Outcome: Progressing Goal: Compliance with treatment plan for underlying cause of condition will improve Outcome: Progressing   Problem: Physical Regulation: Goal: Ability to maintain clinical measurements within normal limits will improve Outcome: Progressing   Problem: Safety: Goal: Periods of time without injury will increase Outcome: Progressing   Problem: Education: Goal: Knowledge of disease or condition will improve Outcome: Progressing Goal: Understanding of discharge needs will improve Outcome: Progressing   Problem: Health Behavior/Discharge Planning: Goal: Ability to identify changes in lifestyle to reduce recurrence of condition will improve Outcome: Progressing Goal: Identification of resources available to assist in meeting health care needs will improve Outcome: Progressing   Problem: Physical Regulation: Goal: Complications related to the disease process, condition or treatment will be avoided or minimized Outcome: Progressing   Problem: Safety: Goal: Ability to remain free from injury will improve Outcome: Progressing   Problem: Education: Goal: Ability to make informed decisions regarding treatment will improve Outcome: Progressing   Problem: Coping: Goal: Coping ability will improve Outcome: Progressing   Problem: Health Behavior/Discharge Planning: Goal: Identification of resources available to assist in meeting health care needs will improve Outcome: Progressing   Problem: Medication: Goal: Compliance with prescribed medication regimen will improve Outcome:  Progressing   Problem: Self-Concept: Goal: Ability to disclose and discuss suicidal ideas will improve Outcome: Progressing Goal: Will verbalize positive feelings about self Outcome: Progressing

## 2020-11-09 NOTE — Progress Notes (Signed)
Recreation Therapy Notes   Date: 11/09/2020  Time: 9:45 am   Location: Craft room   Behavioral response: Appropriate  Intervention Topic: Stress  Discussion/Intervention:  Group content on today was focused on stress. The group defined stress and way to cope with stress. Participants expressed how they know when they are stresses out. Individuals described the different ways they have to cope with stress. The group stated reasons why it is important to cope with stress. Patient explained what good stress is and some examples. The group participated in the intervention "Stress Management". Individuals were separated into two group and answered questions related to stress.  Clinical Observations/Feedback: Patient came to group and defined stress as being bored and overthinking things. He explained that him bored sitting home increases his stress, depression, and anxiety as well as his back pain, which leads him to drinking. Participant expressed that he tries to listen to music, watch television and clean when he is stressed; but that does not always work.  Individual was social with staff and peers while participating in the intervention. Ivan Hamilton LRT/CTRS          Siani Utke 11/09/2020 11:25 AM

## 2020-11-09 NOTE — Tx Team (Signed)
Interdisciplinary Treatment and Diagnostic Plan Update  11/09/2020 Time of Session: 9:00AM Ivan Hamilton MRN: [id number]  Principal Diagnosis: Severe recurrent major depressive disorder with psychotic features [facility])  Secondary Diagnoses: Principal Problem:   Severe recurrent major depressive disorder with psychotic features ([place]) Active Problems:   Chronic post-traumatic stress disorder (PTSD)   Alcohol use disorder, severe, dependence ([place])   Tobacco abuse   HTN (hypertension)   Generalized anxiety disorder   Current Medications:  Current Facility-Administered Medications  Medication Dose Route Frequency Provider Last Rate Last Admin  . acamprosate (CAMPRAL) tablet 666 mg  666 mg Oral TID WC Salley Scarlet, MD   666 mg at 11/09/20 9983  . acetaminophen (TYLENOL) tablet 650 mg  650 mg Oral Q6H PRN Clapacs, Madie Reno, MD   650 mg at 11/09/20 1020  . alum & mag hydroxide-simeth (MAALOX/MYLANTA) 200-200-20 MG/5ML suspension 30 mL  30 mL Oral Q4H PRN Clapacs, John T, MD      . FLUoxetine (PROZAC) capsule 80 mg  80 mg Oral Daily Clapacs, Madie Reno, MD   80 mg at 11/09/20 0820  . gabapentin (NEURONTIN) capsule 800 mg  800 mg Oral TID Clapacs, John T, MD   800 mg at 11/09/20 0820  . hydrOXYzine (ATARAX/VISTARIL) tablet 25 mg  25 mg Oral Q8H PRN Salley Scarlet, MD   25 mg at 11/09/20 3825  . levothyroxine (SYNTHROID) tablet 125 mcg  125 mcg Oral Q0600 Clapacs, Madie Reno, MD   125 mcg at 11/09/20 0656  . lisinopril (ZESTRIL) tablet 5 mg  5 mg Oral Daily Clapacs, Madie Reno, MD   5 mg at 11/09/20 0539  . LORazepam (ATIVAN) injection 0-4 mg  0-4 mg Intravenous Q12H Clapacs, Madie Reno, MD       Or  . LORazepam (ATIVAN) tablet 0-4 mg  0-4 mg Oral Q12H Clapacs, Madie Reno, MD   1 mg at 11/08/20 2124  . magnesium hydroxide (MILK OF MAGNESIA) suspension 30 mL  30 mL Oral Daily PRN Clapacs, John T, MD      . pantoprazole (PROTONIX) EC tablet 40 mg  40 mg Oral Q1200 Clapacs, Madie Reno, MD   40 mg at 11/08/20 1030  .  prazosin (MINIPRESS) capsule 1 mg  1 mg Oral QHS Clapacs, John T, MD   1 mg at 11/08/20 2126  . QUEtiapine (SEROQUEL) tablet 200 mg  200 mg Oral QHS Clapacs, John T, MD   200 mg at 11/08/20 2125  . QUEtiapine (SEROQUEL) tablet 25 mg  25 mg Oral BID PRN Salley Scarlet, MD      . temazepam (RESTORIL) capsule 15 mg  15 mg Oral QHS Clapacs, Madie Reno, MD   15 mg at 11/08/20 2126  . thiamine tablet 100 mg  100 mg Oral Daily Clapacs, John T, MD   100 mg at 11/09/20 0820   Or  . thiamine (B-1) injection 100 mg  100 mg Intravenous Daily Clapacs, John T, MD      . traZODone (DESYREL) tablet 100 mg  100 mg Oral QHS PRN Clapacs, Madie Reno, MD   100 mg at 11/07/20 2125   PTA Medications: Medications Prior to Admission  Medication Sig Dispense Refill Last Dose  . acamprosate (CAMPRAL) 333 MG tablet Take 666 mg by mouth 3 (three) times daily.     . busPIRone (BUSPAR) 15 MG tablet Take 15 mg by mouth 2 (two) times daily.     . citalopram (CELEXA) 10 MG tablet Take 10 mg by  mouth daily.     . cyclobenzaprine (FLEXERIL) 10 MG tablet Take 1 tablet by mouth 3 (three) times daily as needed.     . doxycycline (VIBRAMYCIN) 100 MG capsule Take 100 mg by mouth 2 (two) times daily.     Marland Kitchen FLUoxetine (PROZAC) 40 MG capsule Take 2 capsules (80 mg total) by mouth daily. 60 capsule 1   . gabapentin (NEURONTIN) 600 MG tablet Take 1 tablet (600 mg total) by mouth 3 (three) times daily. 30 tablet 1   . hydrOXYzine (ATARAX/VISTARIL) 25 MG tablet Take 1 tablet (25 mg total) by mouth every 8 (eight) hours as needed for itching. 90 tablet 1   . levothyroxine (SYNTHROID) 125 MCG tablet Take 1 tablet (125 mcg total) by mouth daily at 6 (six) AM. 30 tablet 1   . pantoprazole (PROTONIX) 40 MG tablet Take 1 tablet (40 mg total) by mouth daily at 12 noon. 30 tablet 1   . prazosin (MINIPRESS) 1 MG capsule Take 1 capsule (1 mg total) by mouth at bedtime. (Patient not taking: No sig reported) 30 capsule 1   . QUEtiapine (SEROQUEL) 200 MG  tablet Take 1 tablet (200 mg total) by mouth at bedtime. 30 tablet 1   . temazepam (RESTORIL) 15 MG capsule Take 1 capsule (15 mg total) by mouth at bedtime. 30 capsule 1   . traZODone (DESYREL) 50 MG tablet Take 2 tablets (100 mg total) by mouth at bedtime as needed for sleep. 60 tablet 1     Patient Stressors: Financial difficulties Substance abuse  Patient Strengths: Capable of independent living Motivation for treatment/growth Supportive family/friends  Treatment Modalities: Medication Management, Group therapy, Case management,  1 to 1 session with clinician, Psychoeducation, Recreational therapy.   Physician Treatment Plan for Primary Diagnosis: Severe recurrent major depressive disorder with psychotic features ([place]) Long Term Goal(s): Improvement in symptoms so as ready for discharge Improvement in symptoms so as ready for discharge   Short Term Goals: Ability to identify changes in lifestyle to reduce recurrence of condition will improve Ability to verbalize feelings will improve Ability to disclose and discuss suicidal ideas Ability to demonstrate self-control will improve Ability to identify and develop effective coping behaviors will improve Ability to maintain clinical measurements within normal limits will improve Compliance with prescribed medications will improve Ability to identify triggers associated with substance abuse/mental health issues will improve Ability to identify changes in lifestyle to reduce recurrence of condition will improve Ability to verbalize feelings will improve Ability to disclose and discuss suicidal ideas Ability to demonstrate self-control will improve Ability to identify and develop effective coping behaviors will improve Ability to maintain clinical measurements within normal limits will improve Compliance with prescribed medications will improve Ability to identify triggers associated with substance abuse/mental health issues will  improve  Medication Management: Evaluate patient's response, side effects, and tolerance of medication regimen.  Therapeutic Interventions: 1 to 1 sessions, Unit Group sessions and Medication administration.  Evaluation of Outcomes: Progressing  Physician Treatment Plan for Secondary Diagnosis: Principal Problem:   Severe recurrent major depressive disorder with psychotic features ([place]) Active Problems:   Chronic post-traumatic stress disorder (PTSD)   Alcohol use disorder, severe, dependence ([facility])   Tobacco abuse   HTN (hypertension)   Generalized anxiety disorder  Long Term Goal(s): Improvement in symptoms so as ready for discharge Improvement in symptoms so as ready for discharge   Short Term Goals: Ability to identify changes in lifestyle to reduce recurrence of condition will improve Ability to  verbalize feelings will improve Ability to disclose and discuss suicidal ideas Ability to demonstrate self-control will improve Ability to identify and develop effective coping behaviors will improve Ability to maintain clinical measurements within normal limits will improve Compliance with prescribed medications will improve Ability to identify triggers associated with substance abuse/mental health issues will improve Ability to identify changes in lifestyle to reduce recurrence of condition will improve Ability to verbalize feelings will improve Ability to disclose and discuss suicidal ideas Ability to demonstrate self-control will improve Ability to identify and develop effective coping behaviors will improve Ability to maintain clinical measurements within normal limits will improve Compliance with prescribed medications will improve Ability to identify triggers associated with substance abuse/mental health issues will improve     Medication Management: Evaluate patient's response, side effects, and tolerance of medication regimen.  Therapeutic Interventions: 1 to 1 sessions,  Unit Group sessions and Medication administration.  Evaluation of Outcomes: Progressing   RN Treatment Plan for Primary Diagnosis: Severe recurrent major depressive disorder with psychotic features ([place]) Long Term Goal(s): Knowledge of disease and therapeutic regimen to maintain health will improve  Short Term Goals: Ability to remain free from injury will improve, Ability to verbalize frustration and anger appropriately will improve, Ability to demonstrate self-control, Ability to participate in decision making will improve, Ability to verbalize feelings will improve, Ability to disclose and discuss suicidal ideas, Ability to identify and develop effective coping behaviors will improve and Compliance with prescribed medications will improve  Medication Management: RN will administer medications as ordered by provider, will assess and evaluate patient's response and provide education to patient for prescribed medication. RN will report any adverse and/or side effects to prescribing provider.  Therapeutic Interventions: 1 on 1 counseling sessions, Psychoeducation, Medication administration, Evaluate responses to treatment, Monitor vital signs and CBGs as ordered, Perform/monitor CIWA, COWS, AIMS and Fall Risk screenings as ordered, Perform wound care treatments as ordered.  Evaluation of Outcomes: Progressing   LCSW Treatment Plan for Primary Diagnosis: Severe recurrent major depressive disorder with psychotic features ([place]) Long Term Goal(s): Safe transition to appropriate next level of care at discharge, Engage patient in therapeutic group addressing interpersonal concerns.  Short Term Goals: Engage patient in aftercare planning with referrals and resources, Increase social support, Increase ability to appropriately verbalize feelings, Increase emotional regulation, Facilitate acceptance of mental health diagnosis and concerns, Facilitate patient progression through stages of change regarding  substance use diagnoses and concerns, Identify triggers associated with mental health/substance abuse issues and Increase skills for wellness and recovery  Therapeutic Interventions: Assess for all discharge needs, 1 to 1 time with Social worker, Explore available resources and support systems, Assess for adequacy in community support network, Educate family and significant other(s) on suicide prevention, Complete Psychosocial Assessment, Interpersonal group therapy.  Evaluation of Outcomes: Progressing   Progress in Treatment: Attending groups: Yes. Participating in groups: Yes. Taking medication as prescribed: Yes. Toleration medication: Yes. Family/Significant other contact made: Yes, individual(s) contacted:  ex-wife, Roberts Gaudy. Patient understands diagnosis: Yes. Discussing patient identified problems/goals with staff: Yes. Medical problems stabilized or resolved: Yes. Denies suicidal/homicidal ideation: Yes. Issues/concerns per patient self-inventory: No. Other: None.  New problem(s) identified: No, Describe:  none.  New Short Term/Long Term Goal(s): detox, medication management for mood stabilization; elimination of SI thoughts; development of comprehensive mental wellness/sobriety plan.  Patient Goals: "Get a start to not start drinking again."    Discharge Plan or Barriers: Pt is interested in getting further inpatient treatment for alcohol use.  Reason  for Continuation of Hospitalization: Medication stabilization Suicidal ideation Withdrawal symptoms  Estimated Length of Stay: 1-7 days  Attendees: Patient: Ivan Hamilton 11/09/2020 10:48 AM  Physician: Selina Cooley, MD 11/09/2020 10:48 AM  Nursing: Amie Critchley, RN 11/09/2020 10:48 AM  RN Care Manager: 11/09/2020 10:48 AM  Social Worker: Chalmers Guest. Guerry Bruin, MSW, [place], [place] 11/09/2020 10:48 AM  Recreational Therapist: Devin Going, LRT  11/09/2020 10:48 AM  Other:  11/09/2020 10:48 AM  Other:  11/09/2020 10:48 AM   Other: 11/09/2020 10:48 AM    Scribe for Treatment Team: Shirl Harris, LCSW 11/09/2020 10:48 AM

## 2020-11-09 NOTE — B[facility] Counselor (Signed)
LCSW Group Therapy Note  11/09/2020 3:13 PM  Type of Therapy/Topic:  Group Therapy:  Emotion Regulation  Participation Level:  Active   Description of Group:   The purpose of this group is to assist patients in learning to regulate negative emotions and experience positive emotions. Patients will be guided to discuss ways in which they have been vulnerable to their negative emotions. These vulnerabilities will be juxtaposed with experiences of positive emotions or situations, and patients will be challenged to use positive emotions to combat negative ones. Special emphasis will be placed on coping with negative emotions in conflict situations, and patients will process healthy conflict resolution skills.  Therapeutic Goals: 1. Patient will identify two positive emotions or experiences to reflect on in order to balance out negative emotions 2. Patient will label two or more emotions that they find the most difficult to experience 3. Patient will demonstrate positive conflict resolution skills through discussion and/or role plays  Summary of Patient Progress: Patient was present for the entirety of the group. He was actively involved in the discussion although he had difficulty identifying his own feelings. Pt has issues with PTSD and loneliness. He appeared receptive to feedback provided by his peers.   Therapeutic Modalities:   Cognitive Behavioral Therapy Feelings Identification Dialectical Behavioral Therapy  Chalmers Guest. Guerry Bruin, MSW, LCSW, [place] 11/09/2020 3:13 PM

## 2020-11-09 NOTE — Progress Notes (Signed)
Recreation Therapy Notes  INPATIENT RECREATION THERAPY ASSESSMENT  Patient Details Name: Ivan Hamilton MRN: [id number] DOB: May 07, 1966 Today's Date: 11/09/2020       Information Obtained From: Patient  Able to Participate in Assessment/Interview: Yes  Patient Presentation: Responsive  Reason for Admission (Per Patient): Active Symptoms,Suicidal Ideation,Substance Abuse  Patient Stressors: Relationship,Work  Coping Skills:   TV,Music,Substance Abuse,Talk,Other (Comment) (Clean up)  Leisure Interests (2+):  Social - Family (Talking to new people)  Frequency of Recreation/Participation: Monthly  Awareness of Community Resources:  Yes  Community Resources:  [employer]  Current Use: No  If no, Barriers?: Financial,Attitudinal  Expressed Interest in [place]: No  County of Residence:  [place]  Patient Main Form of Transportation: Car  Patient Strengths:  N/A  Patient Identified Areas of Improvement:  My drinking  Patient Goal for Hospitalization:  To get treatment and stop drinking and not start again.  Current SI (including self-harm):  No  Current HI:  No  Current AVH: No  Staff Intervention Plan: Group Attendance,Collaborate with Interdisciplinary Treatment Team  Consent to Intern Participation: N/A  Ivan Hamilton 11/09/2020, 11:49 AM

## 2020-11-10 DIAGNOSIS — F333 Major depressive disorder, recurrent, severe with psychotic symptoms: Secondary | ICD-10-CM | POA: Diagnosis not present

## 2020-11-10 MED ORDER — IBUPROFEN 600 MG PO TABS: 600.0000 mg | ORAL_TABLET | Freq: Four times a day (QID) | ORAL | Status: DC | PRN

## 2020-11-10 NOTE — [facility] Group Notes (Signed)
LCSW Group Therapy Note     11/10/2020 4:00 PM     Type of Therapy/Topic:  Group Therapy:  Balance in Life     Participation Level:  Active     Description of Group:    This group will address the concept of balance and how it feels and looks when one is unbalanced. Patients will be encouraged to process areas in their lives that are out of balance and identify reasons for remaining unbalanced. Facilitators will guide patients in utilizing problem-solving interventions to address and correct the stressor making their life unbalanced. Understanding and applying boundaries will be explored and addressed for obtaining and maintaining a balanced life. Patients will be encouraged to explore ways to assertively make their unbalanced needs known to significant others in their lives, using other group members and facilitator for support and feedback.     Therapeutic Goals:  1.      Patient will identify two or more emotions or situations they have that consume much of in their lives.  2.      Patient will identify signs/triggers that life has become out of balance:  3.      Patient will identify two ways to set boundaries in order to achieve balance in their lives:  4.      Patient will demonstrate ability to communicate their needs through discussion and/or role plays     Summary of Patient Progress: Patient was present for the entirety of the group session. Patient was an active listener and participated in the topic of discussion, provided helpful advice to others, and added nuance to topic of conversation.  Pt discussed that he wants to "show his family that he can do what he says." He stated that they no longer believe that he can get better so he really wants to try to do a program and be successful. Pt also stated that he wants to do more community service to help balance his life. He said it will also help him stay busy and that is an important coping measure for cutting down on  his alcohol use.       Therapeutic Modalities:   Cognitive Behavioral Therapy  Solution-Focused Therapy  Assertiveness Training     Zyah Gomm [place] MSW, LCSW-A  11/10/2020 4:00 PM

## 2020-11-10 NOTE — Progress Notes (Signed)
[facility] MD Progress Note  11/10/2020 11:56 AM Ivan Hamilton  MRN:  [id number]  CC "Still feeling anxious"  Subjective:  55 year old male with MDD, PTSD, and alcohol use disorder presenting for suicidal statements. No acute events overnight, medication compliant, attending to ADLs. He notes that he is feeling somewhat better than he did on admission. He is no longer have suicidal ideations. He also denies homicidal ideations, visual hallucinations, and auditory hallucinations. He continues to feel he would relapse at discharge without further assistance, and is hopeful to get into residential substance abuse treatment. [facility] has expressed some interest in accepting client. He is completing phone interview at time of this note. Team will continue to follow-up on this potential placement.    Principal Problem: Severe recurrent major depressive disorder with psychotic features ([place]) Diagnosis: Principal Problem:   Severe recurrent major depressive disorder with psychotic features ([place]) Active Problems:   Chronic post-traumatic stress disorder (PTSD)   Alcohol use disorder, severe, dependence ([place])   Tobacco abuse   HTN (hypertension)   Generalized anxiety disorder  Total Time spent with patient: 30 minutes  Past Psychiatric History: See H&P  Past Medical History:  Past Medical History:  Diagnosis Date  . Alcohol withdrawal ([place])   . B12 deficiency   . Chronic pain   . CVA (cerebral vascular accident) ([place])   . Hypertension   . Hypothyroidism   . Varicose veins     Past Surgical History:  Procedure Laterality Date  . CHOLECYSTECTOMY    . ESOPHAGOGASTRODUODENOSCOPY (EGD) WITH PROPOFOL N/A 10/24/2017   Procedure: ESOPHAGOGASTRODUODENOSCOPY (EGD) WITH PROPOFOL;  Surgeon: [name], MD;  Location: [facility] ENDOSCOPY;  Service: Endoscopy;  Laterality: N/A;  . ESOPHAGOGASTRODUODENOSCOPY (EGD) WITH PROPOFOL N/A 07/12/2019   Procedure: ESOPHAGOGASTRODUODENOSCOPY (EGD)  WITH PROPOFOL;  Surgeon: Jonathon Bellows, MD;  Location: [facility] ENDOSCOPY;  Service: Gastroenterology;  Laterality: N/A;  . LEG SURGERY     Family History: History reviewed. No pertinent family history. Family Psychiatric  History: See H&P Social History:  Social History   Substance and Sexual Activity  Alcohol Use Yes  . Alcohol/week: 21.0 standard drinks  . Types: 21 Shots of liquor per week   Comment: daily- 1 pint of vodka     Social History   Substance and Sexual Activity  Drug Use No    Social History   Socioeconomic History  . Marital status: Married    Spouse name: Not on file  . Number of children: Not on file  . Years of education: Not on file  . Highest education level: Not on file  Occupational History  . Not on file  Tobacco Use  . Smoking status: Never Smoker  . Smokeless tobacco: Never Used  Vaping Use  . Vaping Use: Never used  Substance and Sexual Activity  . Alcohol use: Yes    Alcohol/week: 21.0 standard drinks    Types: 21 Shots of liquor per week    Comment: daily- 1 pint of vodka  . Drug use: No  . Sexual activity: Not on file  Other Topics Concern  . Not on file  Social History Narrative  . Not on file   Social Determinants of Health   Financial Resource Strain: Not on file  Food Insecurity: Not on file  Transportation Needs: Not on file  Physical Activity: Not on file  Stress: Not on file  Social Connections: Not on file   Additional Social History:   Sleep: Fair  Appetite:  Fair  Current Medications:  Current Facility-Administered Medications  Medication Dose Route Frequency Provider Last Rate Last Admin  . acamprosate (CAMPRAL) tablet 666 mg  666 mg Oral TID WC Salley Scarlet, MD   666 mg at 11/10/20 0813  . acetaminophen (TYLENOL) tablet 650 mg  650 mg Oral Q6H PRN Clapacs, Madie Reno, MD   650 mg at 11/09/20 2035  . alum & mag hydroxide-simeth (MAALOX/MYLANTA) 200-200-20 MG/5ML suspension 30 mL  30 mL Oral Q4H PRN Clapacs, John T,  MD      . FLUoxetine (PROZAC) capsule 80 mg  80 mg Oral Daily Clapacs, Madie Reno, MD   80 mg at 11/10/20 0813  . gabapentin (NEURONTIN) capsule 800 mg  800 mg Oral TID Clapacs, John T, MD   800 mg at 11/10/20 1025  . hydrOXYzine (ATARAX/VISTARIL) tablet 25 mg  25 mg Oral Q8H PRN Salley Scarlet, MD   25 mg at 11/10/20 0817  . ibuprofen (ADVIL) tablet 600 mg  600 mg Oral Q6H PRN Salley Scarlet, MD      . levothyroxine (SYNTHROID) tablet 125 mcg  125 mcg Oral Q0600 Gonzella Lex, MD   125 mcg at 11/10/20 [phone]  . lisinopril (ZESTRIL) tablet 5 mg  5 mg Oral Daily Clapacs, Madie Reno, MD   5 mg at 11/10/20 0813  . magnesium hydroxide (MILK OF MAGNESIA) suspension 30 mL  30 mL Oral Daily PRN Clapacs, John T, MD      . pantoprazole (PROTONIX) EC tablet 40 mg  40 mg Oral Q1200 Clapacs, Madie Reno, MD   40 mg at 11/09/20 1106  . prazosin (MINIPRESS) capsule 1 mg  1 mg Oral QHS Clapacs, John T, MD   1 mg at 11/09/20 2300  . QUEtiapine (SEROQUEL) tablet 200 mg  200 mg Oral QHS Clapacs, John T, MD   200 mg at 11/09/20 2259  . QUEtiapine (SEROQUEL) tablet 25 mg  25 mg Oral BID PRN Salley Scarlet, MD   25 mg at 11/09/20 1105  . thiamine tablet 100 mg  100 mg Oral Daily Clapacs, John T, MD   100 mg at 11/10/20 7824   Or  . thiamine (B-1) injection 100 mg  100 mg Intravenous Daily Clapacs, John T, MD      . traZODone (DESYREL) tablet 100 mg  100 mg Oral QHS PRN Clapacs, Madie Reno, MD   100 mg at 11/09/20 2301    Lab Results:  No results found for this or any previous visit (from the past 48 hour(s)).  Blood Alcohol level:  Lab Results  Component Value Date   ETH <10 11/06/2020   ETH 322 (HH) 23/53/6144    Metabolic Disorder Labs: Lab Results  Component Value Date   HGBA1C 5.3 11/08/2020   MPG 105.41 11/08/2020   MPG 105 05/12/2020   No results found for: PROLACTIN Lab Results  Component Value Date   CHOL 167 11/08/2020   TRIG 89 11/08/2020   HDL 90 11/08/2020   CHOLHDL 1.9 11/08/2020   VLDL 18  11/08/2020   LDLCALC 59 11/08/2020   LDLCALC 103 (H) 05/12/2020    Physical Findings: AIMS: Facial and Oral Movements Muscles of Facial Expression: None, normal Lips and Perioral Area: None, normal Jaw: None, normal Tongue: None, normal,Extremity Movements Upper (arms, wrists, hands, fingers): None, normal Lower (legs, knees, ankles, toes): None, normal, Trunk Movements Neck, shoulders, hips: None, normal, Overall Severity Severity of abnormal movements (highest score from questions above): None, normal Incapacitation due to abnormal movements: None,  normal Patient's awareness of abnormal movements (rate only patient's report): No Awareness, Dental Status Current problems with teeth and/or dentures?: No Does patient usually wear dentures?: No  CIWA:  CIWA-Ar Total: 0 COWS:     Musculoskeletal: Strength & Muscle Tone: within normal limits Gait & Station: normal Patient leans: N/A  Psychiatric Specialty Exam:  Presentation  General Appearance: Casual  Eye Contact:Good  Speech:Normal Rate  Speech Volume:Normal  Handedness:Right   Mood and Affect  Mood:Depressed; Dysphoric; Anxious  Affect: Congruent  Thought Process  Thought Processes:Coherent  Descriptions of Associations:Intact  Orientation:Full (Time, Place and Person)  Thought Content:Perseveration  History of Schizophrenia/Schizoaffective disorder:No  Duration of Psychotic Symptoms:Less than six months  Hallucinations:Hallucinations: Auditory  Ideas of Reference:None  Suicidal Thoughts:Suicidal Thoughts: No  Homicidal Thoughts:Homicidal Thoughts: No   Sensorium  Memory:Immediate Good; Remote Good; Recent Good  Judgment:Intact  Insight:Present   Executive Functions  Concentration:Good  Attention Span:Good  [place] of Knowledge:Good  Language:Good   Psychomotor Activity  Psychomotor Activity:Psychomotor Activity: Normal   Assets  Assets:Communication Skills; Desire  for Improvement; Financial Resources/Insurance; Housing; Resilience; Social Support   Sleep  Sleep:Fair, 7 hours   Physical Exam: Physical Exam  ROS  Blood pressure 121/85, pulse 81, temperature 98.3 F (36.8 C), temperature source Oral, resp. rate 17, height 5\' 5"  (1.651 m), weight 68.9 kg, SpO2 97 %. Body mass index is 25.29 kg/m.   Treatment Plan Summary: Daily contact with patient to assess and evaluate symptoms and progress in treatment and Medication management 1-3) MDD, recurrent severe with psychotic features; chronic PTSD; GAD - Continue Prozac 80 mg daily for mood, seroquel 200 mg QHS for hallucinations, prazosin 1 mg QHS for nightmares - Discontinue Restoril  - Continue Seroquel 25 mg BID for continued anxiety and depression  4) Alcohol Use Disorder, severe- established problem, unstable - Gabapentin 800 mg TID - Acamprosate 666 mg TID  - CIWA protocol - MVI, folate, thiamine, check vitamin b1 levels  - Requesting residential substance abuse treatment  5) HTN- established problem - Continue lisinipril 5 mg daily  11/10/20: Psychiatric exam above reviewed and remains accurate. Assessment and plan above reviewed and updated.    Salley Scarlet, MD 11/10/2020, 11:56 AM

## 2020-11-10 NOTE — Progress Notes (Addendum)
Patient is alert and oriented. He is calm and cooperative with assessment. Patient denies SI, HI, AVH. Patient endorses anxiety but denies all other s/s of alcohol withdrawal. Hydroxyzine PRN was requested and given. Patient feels that hydroxyzine has been helping with anxiety but still feels his depression is "bad'.  Patient remains medication compliant. Support and encouragement provided. Patient remains safe on the unit at this time and q15 min safety checks are maintained.

## 2020-11-10 NOTE — Progress Notes (Signed)
Recreation Therapy Notes   Date: 11/10/2020  Time: 10:00 am   Location: Craft room   Behavioral response: Appropriate  Intervention Topic: Emotions   Discussion/Intervention:  Group content on today was focused on emotions. The group identified what emotions are and why it is important to have emotions. Patients expressed some positive and negative emotions. Individuals gave some past experiences on how they normally dealt with emotions in the past. The group described some positive ways to deal with emotions in the future. Patients participated in the intervention "Name the [name]" where individuals were given a chance to experience different emotions.  Clinical Observations/Feedback: Patient came to group and defined emotions as thinking positive and having good feelings.He stated that his emotions changes daily but he remains happy a lot. Participant explained that emotions can change the style of your life. Individual was social with staff and peers while participating in the intervention. Antia Rahal LRT/CTRS         Honor Fairbank 11/10/2020 12:08 PM

## 2020-11-11 MED ORDER — PRAZOSIN HCL 1 MG PO CAPS: 1.0000 mg | ORAL_CAPSULE | Freq: Every day | ORAL | 1 refills | Status: DC

## 2020-11-11 MED ORDER — ACAMPROSATE CALCIUM 333 MG PO TBEC: 666.0000 mg | DELAYED_RELEASE_TABLET | Freq: Three times a day (TID) | ORAL | 1 refills | Status: DC

## 2020-11-11 MED ORDER — THIAMINE HCL 100 MG PO TABS: 100.0000 mg | ORAL_TABLET | Freq: Every day | ORAL | 1 refills | Status: DC

## 2020-11-11 MED ORDER — QUETIAPINE FUMARATE 200 MG PO TABS: 200.0000 mg | ORAL_TABLET | Freq: Every day | ORAL | 1 refills | Status: DC

## 2020-11-11 MED ORDER — PANTOPRAZOLE SODIUM 40 MG PO TBEC: 40.0000 mg | DELAYED_RELEASE_TABLET | Freq: Every day | ORAL | 1 refills | Status: DC

## 2020-11-11 MED ORDER — LEVOTHYROXINE SODIUM 125 MCG PO TABS: 125.0000 ug | ORAL_TABLET | Freq: Every day | ORAL | 1 refills | Status: DC

## 2020-11-11 MED ORDER — TRAZODONE HCL 100 MG PO TABS: 100.0000 mg | ORAL_TABLET | Freq: Every evening | ORAL | 1 refills | Status: DC | PRN

## 2020-11-11 MED ORDER — HYDROXYZINE HCL 25 MG PO TABS: 25.0000 mg | ORAL_TABLET | Freq: Three times a day (TID) | ORAL | 1 refills | Status: DC | PRN

## 2020-11-11 MED ORDER — LISINOPRIL 5 MG PO TABS: 5.0000 mg | ORAL_TABLET | Freq: Every day | ORAL | 1 refills | Status: DC

## 2020-11-11 MED ORDER — FLUOXETINE HCL 40 MG PO CAPS: 80.0000 mg | ORAL_CAPSULE | Freq: Every day | ORAL | 1 refills | Status: DC

## 2020-11-11 MED ORDER — GABAPENTIN 800 MG PO TABS: 800.0000 mg | ORAL_TABLET | Freq: Three times a day (TID) | ORAL | 1 refills | Status: DC

## 2020-11-11 NOTE — Plan of Care (Signed)
  Problem: Substance Use/Abuse Goal: STG - Patient will identify 3 positive replacements for unhealthy/harmful habits within 5 recreation therapy group sessions Description: STG - Patient will identify 3 positive replacements for unhealthy/harmful habits within 5 recreation therapy group sessions 11/11/2020 1051 by Ernest Haber, LRT Outcome: Adequate for Discharge 11/11/2020 1051 by Ernest Haber, LRT Outcome: Adequate for Discharge

## 2020-11-11 NOTE — Progress Notes (Signed)
Patient alert and oriented x 4, affect is blunted thoughts are organized and coherent, he appears less anxious , he was noted interacting appropriately with peers and staff no distress noted, he was complaint with medication regimen, 15 minutes safety checks maintained will continue to monitor.

## 2020-11-11 NOTE — Discharge Summary (Signed)
Physician Discharge Summary Note  Patient:  Ivan Hamilton is an 55 y.o., male MRN:  [id number] DOB:  [birth year] Patient phone:  [phone] (home)  Patient address:   [place] [id number],  Total Time spent with patient: 35 minutes- 25 minutes face-to-face contact with patient, 10 minutes documentation, coordination of care, scripts   Date of Admission:  11/07/2020 Date of Discharge: 11/11/2020  Reason for Admission:  55 year old male with MDD, PTSD, and alcohol use disorder presenting for suicidal statements  Principal Problem: Severe recurrent major depressive disorder with psychotic features [facility]) Discharge Diagnoses: Principal Problem:   Severe recurrent major depressive disorder with psychotic features ([place]) Active Problems:   Chronic post-traumatic stress disorder (PTSD)   Alcohol use disorder, severe, dependence ([place])   Tobacco abuse   HTN (hypertension)   Generalized anxiety disorder   Past Psychiatric History: Longstanding history of PTSD and alcohol abuse. Has had short periods of sobriety but does not really follow up very well with outpatient treatment. Does have a history of many suicidal threats and some self injury  Past Medical History:  Past Medical History:  Diagnosis Date  . Alcohol withdrawal ([place])   . B12 deficiency   . Chronic pain   . CVA (cerebral vascular accident) ([place])   . Hypertension   . Hypothyroidism   . Varicose veins     Past Surgical History:  Procedure Laterality Date  . CHOLECYSTECTOMY    . ESOPHAGOGASTRODUODENOSCOPY (EGD) WITH PROPOFOL N/A 10/24/2017   Procedure: ESOPHAGOGASTRODUODENOSCOPY (EGD) WITH PROPOFOL;  Surgeon: [name], MD;  Location: [facility] ENDOSCOPY;  Service: Endoscopy;  Laterality: N/A;  . ESOPHAGOGASTRODUODENOSCOPY (EGD) WITH PROPOFOL N/A 07/12/2019   Procedure: ESOPHAGOGASTRODUODENOSCOPY (EGD) WITH PROPOFOL;  Surgeon: Jonathon Bellows, MD;  Location: [facility] ENDOSCOPY;  Service: Gastroenterology;   Laterality: N/A;  . LEG SURGERY     Family History: History reviewed. No pertinent family history. Family Psychiatric  History: Family members with alcohol use disorder Social History:  Social History   Substance and Sexual Activity  Alcohol Use Yes  . Alcohol/week: 21.0 standard drinks  . Types: 21 Shots of liquor per week   Comment: daily- 1 pint of vodka     Social History   Substance and Sexual Activity  Drug Use No    Social History   Socioeconomic History  . Marital status: Married    Spouse name: Not on file  . Number of children: Not on file  . Years of education: Not on file  . Highest education level: Not on file  Occupational History  . Not on file  Tobacco Use  . Smoking status: Never Smoker  . Smokeless tobacco: Never Used  Vaping Use  . Vaping Use: Never used  Substance and Sexual Activity  . Alcohol use: Yes    Alcohol/week: 21.0 standard drinks    Types: 21 Shots of liquor per week    Comment: daily- 1 pint of vodka  . Drug use: No  . Sexual activity: Not on file  Other Topics Concern  . Not on file  Social History Narrative  . Not on file   Social Determinants of Health   Financial Resource Strain: Not on file  Food Insecurity: Not on file  Transportation Needs: Not on file  Physical Activity: Not on file  Stress: Not on file  Social Connections: Not on file    Hospital Course:  55 year old male with MDD, PTSD, and alcohol use disorder presenting for suicidal statements. Patient was admitted to our behavioral  health unit, and restarted on home medications as below. He completed acute alcohol detox via CIWA protocol without complication. At time of discharge he denies suicidal ideations, homicidal ideations, visual hallucinations, and auditory hallucinations. He continues to desire residential treatment for alcohol use disorder, and has been accepted to [facility]. Officer Konrad Penta (probation officer), [phone] ext [phone]  contacted on day of discharge, and voicemail left with plan of care.   Physical Findings: AIMS: Facial and Oral Movements Muscles of Facial Expression: None, normal Lips and Perioral Area: None, normal Jaw: None, normal Tongue: None, normal,Extremity Movements Upper (arms, wrists, hands, fingers): None, normal Lower (legs, knees, ankles, toes): None, normal, Trunk Movements Neck, shoulders, hips: None, normal, Overall Severity Severity of abnormal movements (highest score from questions above): None, normal Incapacitation due to abnormal movements: None, normal Patient's awareness of abnormal movements (rate only patient's report): No Awareness, Dental Status Current problems with teeth and/or dentures?: No Does patient usually wear dentures?: No  CIWA:  CIWA-Ar Total: 0 COWS:     Musculoskeletal: Strength & Muscle Tone: within normal limits Gait & Station: normal Patient leans: N/A   Psychiatric Specialty Exam: General Appearance: Fairly Groomed  Engineer, water::  Good  Speech:  Clear and Coherent and Normal Rate  Volume:  Normal  Mood:  Anxious  Affect:  Congruent  Thought Process:  Coherent and Linear  Orientation:  Full (Time, Place, and Person)  Thought Content:  Logical  Suicidal Thoughts:  No  Homicidal Thoughts:  No  Memory:  Immediate;   Fair Recent;   Fair Remote;   Fair  Judgement:  Intact  Insight:  Fair  Psychomotor Activity:  Normal  Concentration:  Fair  Recall:  [employer] of Knowledge:Fair  Language: Fair  Akathisia:  Negative  Handed:  Right  AIMS (if indicated):     Assets:  Communication Skills Desire for Improvement Financial Resources/Insurance Housing Resilience  Sleep:  Number of Hours: 7  Cognition: WNL  ADL's:  Intact     Physical Exam: Physical Exam Vitals and nursing note reviewed.  Constitutional:      Appearance: Normal appearance.  HENT:     Head: Normocephalic and atraumatic.     Right Ear: External ear normal.     Left  Ear: External ear normal.     Nose: Nose normal.     Mouth/Throat:     Mouth: Mucous membranes are moist.     Pharynx: Oropharynx is clear.  Eyes:     Extraocular Movements: Extraocular movements intact.     Conjunctiva/sclera: Conjunctivae normal.     Pupils: Pupils are equal, round, and reactive to light.  Cardiovascular:     Rate and Rhythm: Normal rate.     Pulses: Normal pulses.  Pulmonary:     Effort: Pulmonary effort is normal.     Breath sounds: Normal breath sounds.  Abdominal:     General: Abdomen is flat.     Palpations: Abdomen is soft.  Musculoskeletal:        General: No swelling. Normal range of motion.     Cervical back: Normal range of motion and neck supple.  Skin:    General: Skin is warm and dry.  Neurological:     General: No focal deficit present.     Mental Status: He is alert and oriented to person, place, and time.  Psychiatric:        Behavior: Behavior normal.        Thought Content: Thought  content normal.        Judgment: Judgment normal.    Review of Systems  Constitutional: Negative.   HENT: Negative.   Eyes: Negative.   Respiratory: Negative.   Cardiovascular: Negative.   Gastrointestinal: Negative.   Endocrine: Negative.   Genitourinary: Negative.   Musculoskeletal: Negative.   Skin: Negative.   Allergic/Immunologic: Negative.   Neurological: Negative.   Hematological: Negative.   Psychiatric/Behavioral: Negative for agitation, behavioral problems, hallucinations, sleep disturbance and suicidal ideas.   Blood pressure (!) 124/93, pulse 76, temperature 97.7 F (36.5 C), temperature source Oral, resp. rate 18, height 5\' 5"  (1.651 m), weight 68.9 kg, SpO2 98 %. Body mass index is 25.29 kg/m.   Have you used any form of tobacco in the last 30 days? (Cigarettes, Smokeless Tobacco, Cigars, and/or Pipes): No  Has this patient used any form of tobacco in the last 30 days? (Cigarettes, Smokeless Tobacco, Cigars, and/or Pipes)  No  Blood  Alcohol level:  Lab Results  Component Value Date   ETH <10 11/06/2020   ETH 322 (HH) 18/84/1660    Metabolic Disorder Labs:  Lab Results  Component Value Date   HGBA1C 5.3 11/08/2020   MPG 105.41 11/08/2020   MPG 105 05/12/2020   No results found for: PROLACTIN Lab Results  Component Value Date   CHOL 167 11/08/2020   TRIG 89 11/08/2020   HDL 90 11/08/2020   CHOLHDL 1.9 11/08/2020   VLDL 18 11/08/2020   LDLCALC 59 11/08/2020   LDLCALC 103 (H) 05/12/2020    See Psychiatric Specialty Exam and Suicide Risk Assessment completed by Attending Physician prior to discharge.  Discharge destination:  Other:  [place]  Is patient on multiple antipsychotic therapies at discharge:  No   Has Patient had three or more failed trials of antipsychotic monotherapy by history:  No  Recommended Plan for Multiple Antipsychotic Therapies: NA  Discharge Instructions    Diet - low sodium heart healthy   Complete by: As directed    Increase activity slowly   Complete by: As directed    Increase activity slowly   Complete by: As directed      Allergies as of 11/11/2020   No Known Allergies     Medication List    STOP taking these medications   busPIRone 15 MG tablet Commonly known as: BUSPAR   citalopram 10 MG tablet Commonly known as: CELEXA   cyclobenzaprine 10 MG tablet Commonly known as: FLEXERIL   doxycycline 100 MG capsule Commonly known as: VIBRAMYCIN   temazepam 15 MG capsule Commonly known as: RESTORIL     TAKE these medications     Indication  acamprosate 333 MG tablet Commonly known as: CAMPRAL Take 2 tablets (666 mg total) by mouth 3 (three) times daily with meals. What changed: when to take this  Indication: Abuse or Misuse of Alcohol   FLUoxetine 40 MG capsule Commonly known as: PROZAC Take 2 capsules (80 mg total) by mouth daily.  Indication: Posttraumatic Stress Disorder   gabapentin 800 MG tablet Commonly known as:  NEURONTIN Take 1 tablet (800 mg total) by mouth 3 (three) times daily. What changed:   medication strength  how much to take  Indication: Neuropathic Pain   hydrOXYzine 25 MG tablet Commonly known as: ATARAX/VISTARIL Take 1 tablet (25 mg total) by mouth every 8 (eight) hours as needed for itching.  Indication: Feeling Anxious   levothyroxine 125 MCG tablet Commonly known as: SYNTHROID Take 1 tablet (125 mcg total)  by mouth daily at 6 (six) AM.  Indication: Underactive Thyroid   lisinopril 5 MG tablet Commonly known as: ZESTRIL Take 1 tablet (5 mg total) by mouth daily. Start taking on: November 12, 2020  Indication: High Blood Pressure Disorder   pantoprazole 40 MG tablet Commonly known as: PROTONIX Take 1 tablet (40 mg total) by mouth daily at 12 noon.  Indication: Gastroesophageal Reflux Disease   prazosin 1 MG capsule Commonly known as: MINIPRESS Take 1 capsule (1 mg total) by mouth at bedtime.  Indication: Frightening Dreams   QUEtiapine 200 MG tablet Commonly known as: SEROQUEL Take 1 tablet (200 mg total) by mouth at bedtime.  Indication: Posttraumatic Stress Disorder   thiamine 100 MG tablet Take 1 tablet (100 mg total) by mouth daily. Start taking on: November 12, 2020  Indication: Deficiency of Vitamin B1   traZODone 100 MG tablet Commonly known as: DESYREL Take 1 tablet (100 mg total) by mouth at bedtime as needed for sleep. What changed: medication strength  Indication: [place] Follow up.   Why: Acceptance into the residential treatment program at [facility]. Transportation will be provided to the facility.  Contact information: [place] [address] [phone]               Follow-up recommendations:  Activity:  as tolerated Diet:  low sodium heart healthy diet  Comments:  Printed 30-day scripts with 1 refill provided to patient at discharge. Voicemail left with  probation officer to alert her of transfer to [facility]. Letter also provided as proof of hospital stay here at [facility].   Signed: Salley Scarlet, MD 11/11/2020, 9:58 AM

## 2020-11-11 NOTE — Progress Notes (Signed)
Recreation Therapy Notes  INPATIENT RECREATION TR PLAN  Patient Details Name: Ivan Hamilton MRN: [id number] DOB: 1965-11-10 Today's Date: 11/11/2020  Rec Therapy Plan Is patient appropriate for Therapeutic Recreation?: Yes Treatment times per week: at least 3 Estimated Length of Stay: 5-7 days TR Treatment/Interventions: Group participation (Comment)  Discharge Criteria Pt will be discharged from therapy if:: Discharged Treatment plan/goals/alternatives discussed and agreed upon by:: Patient/family  Discharge Summary Short term goals set: Patient will identify 3 positive replacements for unhealthy/harmful habits within 5 recreation therapy group sessions Short term goals met: Adequate for discharge Progress toward goals comments: Groups attended Which groups?: Stress management,Other (Comment) (Emotions) Reason goals not met: N/A Therapeutic equipment acquired: N/A Reason patient discharged from therapy: Discharge from hospital Pt/family agrees with progress & goals achieved: Yes Date patient discharged from therapy: 11/11/20   Mariha Sleeper 11/11/2020, 10:52 AM

## 2020-11-11 NOTE — Care Management Important Message (Signed)
Important Message  Patient Details  Name: Ivan Hamilton MRN: [id number] Date of Birth: February 16, 1966   Medicare Important Message Given:  Yes     Yenifer Saccente A [place], Latanya Presser 11/11/2020, 9:13 AM

## 2020-11-11 NOTE — BHH Group Notes (Signed)
LCSW Group Therapy Note  11/11/2020 2:34 PM  Type of Therapy and Topic:  Group Therapy:  Feelings around Relapse and Recovery  Participation Level:  Active   Description of Group:    Patients in this group will discuss emotions they experience before and after a relapse. They will process how experiencing these feelings, or avoidance of experiencing them, relates to having a relapse. Facilitator will guide patients to explore emotions they have related to recovery. Patients will be encouraged to process which emotions are more powerful. They will be guided to discuss the emotional reaction significant others in their lives may have to their relapse or recovery. Patients will be assisted in exploring ways to respond to the emotions of others without this contributing to a relapse.  Therapeutic Goals: 1. Patient will identify two or more emotions that lead to a relapse for them 2. Patient will identify two emotions that result when they relapse 3. Patient will identify two emotions related to recovery 4. Patient will demonstrate ability to communicate their needs through discussion and/or role plays   Summary of Patient Progress: Patient was present for the entirety of the group. He was actively involved in the discussion. Pt asked clarifying questions when needed specifically around triggers. Comments/feedback were pertinent to the discussion.  Therapeutic Modalities:   Cognitive Behavioral Therapy Solution-Focused Therapy Assertiveness Training Relapse Prevention Therapy   Hedy Camara R. Guerry Bruin, MSW, [place], [place] 11/11/2020 2:34 PM

## 2020-11-11 NOTE — BHH Suicide Risk Assessment (Signed)
[facility] Discharge Suicide Risk Assessment   Principal Problem: Severe recurrent major depressive disorder with psychotic features [facility]) Discharge Diagnoses: Principal Problem:   Severe recurrent major depressive disorder with psychotic features ([place]) Active Problems:   Chronic post-traumatic stress disorder (PTSD)   Alcohol use disorder, severe, dependence ([place])   Tobacco abuse   HTN (hypertension)   Generalized anxiety disorder   Total Time spent with patient: 35 minutes- 25 minutes face-to-face contact with patient, 10 minutes documentation, coordination of care, scripts   Musculoskeletal: Strength & Muscle Tone: within normal limits Gait & Station: normal Patient leans: N/A  Psychiatric Specialty Exam: Review of Systems  Constitutional: Negative.   HENT: Negative.   Eyes: Negative.   Respiratory: Negative.   Cardiovascular: Negative.   Gastrointestinal: Negative.   Endocrine: Negative.   Genitourinary: Negative.   Musculoskeletal: Negative.   Skin: Negative.   Allergic/Immunologic: Negative.   Neurological: Negative.   Hematological: Negative.   Psychiatric/Behavioral: Negative for agitation, behavioral problems, hallucinations, sleep disturbance and suicidal ideas.    Blood pressure (!) 124/93, pulse 76, temperature 97.7 F (36.5 C), temperature source Oral, resp. rate 18, height 5\' 5"  (1.651 m), weight 68.9 kg, SpO2 98 %.Body mass index is 25.29 kg/m.  General Appearance: Fairly Groomed  Engineer, water::  Good  Speech:  Clear and Coherent and Normal Rate  Volume:  Normal  Mood:  Anxious  Affect:  Congruent  Thought Process:  Coherent and Linear  Orientation:  Full (Time, Place, and Person)  Thought Content:  Logical  Suicidal Thoughts:  No  Homicidal Thoughts:  No  Memory:  Immediate;   Fair Recent;   Fair Remote;   Fair  Judgement:  Intact  Insight:  Fair  Psychomotor Activity:  Normal  Concentration:  Fair  Recall:  [employer] of Knowledge:Fair  Language:  Fair  Akathisia:  Negative  Handed:  Right  AIMS (if indicated):     Assets:  Communication Skills Desire for Improvement Financial Resources/Insurance Housing Resilience  Sleep:  Number of Hours: 7  Cognition: WNL  ADL's:  Intact   Mental Status Per Nursing Assessment::   On Admission:  NA  Demographic Factors:  Male and Unemployed  Loss Factors: Legal issues  Historical Factors: Family history of mental illness or substance abuse and Impulsivity  Risk Reduction Factors:   Sense of responsibility to family, Religious beliefs about death, Living with another person, especially a relative, Positive social support and Positive therapeutic relationship  Continued Clinical Symptoms:  Depression:   Comorbid alcohol abuse/dependence Hopelessness Impulsivity Insomnia Alcohol/Substance Abuse/Dependencies More than one psychiatric diagnosis Previous Psychiatric Diagnoses and Treatments  Cognitive Features That Contribute To Risk:  None    Suicide Risk:  Mild:  Suicidal ideation of limited frequency, intensity, duration, and specificity.  There are no identifiable plans, no associated intent, mild dysphoria and related symptoms, good self-control (both objective and subjective assessment), few other risk factors, and identifiable protective factors, including available and accessible social support.   [place] Follow up.   Why: Acceptance into the residential treatment program at [facility]. Transportation will be provided to the facility.  Contact information: [place] [address] [phone]               Plan Of Care/Follow-up recommendations:  Activity:  as tolerated Diet:  low sodium heart healthy diet  Salley Scarlet, MD 11/11/2020, 9:53 AM

## 2020-11-11 NOTE — Progress Notes (Signed)
Recreation Therapy Notes  Date: 11/11/2020  Time: 09:30 am   Location: Craft room     Behavioral response: N/A   Intervention Topic: Leisure   Discussion/Intervention: Patient did not attend group.   Clinical Observations/Feedback:  Patient did not attend group.   Lorma Heater LRT/CTRS        Shadoe Cryan 11/11/2020 10:24 AM

## 2020-11-11 NOTE — Progress Notes (Signed)
  [facility] Adult Case Management Discharge Plan :  Will you be returning to the same living situation after discharge:  No. At discharge, do you have transportation home?: Yes,  [place] [facility] Do you have the ability to pay for your medications: Yes,  Medicare  Release of information consent forms completed and in the chart;  Patient's signature needed at discharge.  Patient to Follow up at:  [facility] Follow up.   Why: Acceptance into the residential treatment program at [facility]. Transportation will be provided to the facility.  Contact information: [place] [address] [phone]               Next level of care provider has access to [place] and Suicide Prevention discussed: Yes,  pt's ex-wife  Have you used any form of tobacco in the last 30 days? (Cigarettes, Smokeless Tobacco, Cigars, and/or Pipes): No  Has patient been referred to the Quitline?: Patient refused referral  Patient has been referred for addiction treatment: Yes  Bonney Berres A [place], Latanya Presser 11/11/2020, 9:18 AM

## 2020-11-11 NOTE — Progress Notes (Signed)
Patient is animated and cooperative on assessment. Patient states he is feeling better today and less depressed than on previous encounters. He denies SI, HI, and AVH. He endorses some anxiety, but at a manageable level. Patient is observed to be interacting appropriately with peers and staff on the unit. Patient remains medication compliant. Patient remains safe on the unit at this time and q15 min safety checks are maintained.

## 2020-11-11 NOTE — Progress Notes (Signed)
Pt was educated on prescriptions and follow up care. Pt questions were answered and pt verbalized understanding and did not voice any concerns. Pt's belongings were returned. Patient noted he was missing shoes, but belongings sheet was checked and there was no inventory of his shoes on the unit. Pt was not observed to be in any sign of distress at time of discharge.Pt was safely discharged to the physician on call parking lot, to [facility] treatment center.

## 2020-11-15 DIAGNOSIS — F1020 Alcohol dependence, uncomplicated: Secondary | ICD-10-CM | POA: Diagnosis not present

## 2020-11-15 DIAGNOSIS — F419 Anxiety disorder, unspecified: Secondary | ICD-10-CM | POA: Diagnosis not present

## 2020-11-15 DIAGNOSIS — F329 Major depressive disorder, single episode, unspecified: Secondary | ICD-10-CM | POA: Diagnosis not present

## 2020-11-15 DIAGNOSIS — F4310 Post-traumatic stress disorder, unspecified: Secondary | ICD-10-CM | POA: Diagnosis not present

## 2020-11-16 DIAGNOSIS — F419 Anxiety disorder, unspecified: Secondary | ICD-10-CM | POA: Diagnosis not present

## 2020-11-16 DIAGNOSIS — F4310 Post-traumatic stress disorder, unspecified: Secondary | ICD-10-CM | POA: Diagnosis not present

## 2020-11-16 DIAGNOSIS — F329 Major depressive disorder, single episode, unspecified: Secondary | ICD-10-CM | POA: Diagnosis not present

## 2020-11-16 DIAGNOSIS — F1020 Alcohol dependence, uncomplicated: Secondary | ICD-10-CM | POA: Diagnosis not present

## 2020-11-17 DIAGNOSIS — F4310 Post-traumatic stress disorder, unspecified: Secondary | ICD-10-CM | POA: Diagnosis not present

## 2020-11-17 DIAGNOSIS — F419 Anxiety disorder, unspecified: Secondary | ICD-10-CM | POA: Diagnosis not present

## 2020-11-17 DIAGNOSIS — F1020 Alcohol dependence, uncomplicated: Secondary | ICD-10-CM | POA: Diagnosis not present

## 2020-11-17 DIAGNOSIS — F329 Major depressive disorder, single episode, unspecified: Secondary | ICD-10-CM | POA: Diagnosis not present

## 2020-11-18 DIAGNOSIS — F1020 Alcohol dependence, uncomplicated: Secondary | ICD-10-CM | POA: Diagnosis not present

## 2020-11-18 DIAGNOSIS — F4310 Post-traumatic stress disorder, unspecified: Secondary | ICD-10-CM | POA: Diagnosis not present

## 2020-11-18 DIAGNOSIS — F329 Major depressive disorder, single episode, unspecified: Secondary | ICD-10-CM | POA: Diagnosis not present

## 2020-11-18 DIAGNOSIS — F419 Anxiety disorder, unspecified: Secondary | ICD-10-CM | POA: Diagnosis not present

## 2020-11-18 LAB — VITAMIN B1: Vitamin B1 (Thiamine): 137.1 nmol/L (ref 66.5–200.0)

## 2020-11-19 DIAGNOSIS — F419 Anxiety disorder, unspecified: Secondary | ICD-10-CM | POA: Diagnosis not present

## 2020-11-19 DIAGNOSIS — F329 Major depressive disorder, single episode, unspecified: Secondary | ICD-10-CM | POA: Diagnosis not present

## 2020-11-19 DIAGNOSIS — F1020 Alcohol dependence, uncomplicated: Secondary | ICD-10-CM | POA: Diagnosis not present

## 2020-11-19 DIAGNOSIS — F4310 Post-traumatic stress disorder, unspecified: Secondary | ICD-10-CM | POA: Diagnosis not present

## 2020-11-20 DIAGNOSIS — F4310 Post-traumatic stress disorder, unspecified: Secondary | ICD-10-CM | POA: Diagnosis not present

## 2020-11-20 DIAGNOSIS — F329 Major depressive disorder, single episode, unspecified: Secondary | ICD-10-CM | POA: Diagnosis not present

## 2020-11-20 DIAGNOSIS — F419 Anxiety disorder, unspecified: Secondary | ICD-10-CM | POA: Diagnosis not present

## 2020-11-20 DIAGNOSIS — F1020 Alcohol dependence, uncomplicated: Secondary | ICD-10-CM | POA: Diagnosis not present

## 2020-11-21 DIAGNOSIS — F329 Major depressive disorder, single episode, unspecified: Secondary | ICD-10-CM | POA: Diagnosis not present

## 2020-11-21 DIAGNOSIS — F1020 Alcohol dependence, uncomplicated: Secondary | ICD-10-CM | POA: Diagnosis not present

## 2020-11-21 DIAGNOSIS — F419 Anxiety disorder, unspecified: Secondary | ICD-10-CM | POA: Diagnosis not present

## 2020-11-21 DIAGNOSIS — F4310 Post-traumatic stress disorder, unspecified: Secondary | ICD-10-CM | POA: Diagnosis not present

## 2020-11-22 DIAGNOSIS — F1020 Alcohol dependence, uncomplicated: Secondary | ICD-10-CM | POA: Diagnosis not present

## 2020-11-22 DIAGNOSIS — F4310 Post-traumatic stress disorder, unspecified: Secondary | ICD-10-CM | POA: Diagnosis not present

## 2020-11-22 DIAGNOSIS — F419 Anxiety disorder, unspecified: Secondary | ICD-10-CM | POA: Diagnosis not present

## 2020-11-22 DIAGNOSIS — F329 Major depressive disorder, single episode, unspecified: Secondary | ICD-10-CM | POA: Diagnosis not present

## 2020-11-23 DIAGNOSIS — F329 Major depressive disorder, single episode, unspecified: Secondary | ICD-10-CM | POA: Diagnosis not present

## 2020-11-23 DIAGNOSIS — F1020 Alcohol dependence, uncomplicated: Secondary | ICD-10-CM | POA: Diagnosis not present

## 2020-11-23 DIAGNOSIS — F419 Anxiety disorder, unspecified: Secondary | ICD-10-CM | POA: Diagnosis not present

## 2020-11-23 DIAGNOSIS — F4310 Post-traumatic stress disorder, unspecified: Secondary | ICD-10-CM | POA: Diagnosis not present

## 2020-11-24 DIAGNOSIS — F4310 Post-traumatic stress disorder, unspecified: Secondary | ICD-10-CM | POA: Diagnosis not present

## 2020-11-24 DIAGNOSIS — F419 Anxiety disorder, unspecified: Secondary | ICD-10-CM | POA: Diagnosis not present

## 2020-11-24 DIAGNOSIS — F1020 Alcohol dependence, uncomplicated: Secondary | ICD-10-CM | POA: Diagnosis not present

## 2020-11-24 DIAGNOSIS — F329 Major depressive disorder, single episode, unspecified: Secondary | ICD-10-CM | POA: Diagnosis not present

## 2020-11-25 DIAGNOSIS — F419 Anxiety disorder, unspecified: Secondary | ICD-10-CM | POA: Diagnosis not present

## 2020-11-25 DIAGNOSIS — F329 Major depressive disorder, single episode, unspecified: Secondary | ICD-10-CM | POA: Diagnosis not present

## 2020-11-25 DIAGNOSIS — F1020 Alcohol dependence, uncomplicated: Secondary | ICD-10-CM | POA: Diagnosis not present

## 2020-11-25 DIAGNOSIS — F4310 Post-traumatic stress disorder, unspecified: Secondary | ICD-10-CM | POA: Diagnosis not present

## 2020-11-26 DIAGNOSIS — F419 Anxiety disorder, unspecified: Secondary | ICD-10-CM | POA: Diagnosis not present

## 2020-11-26 DIAGNOSIS — F1020 Alcohol dependence, uncomplicated: Secondary | ICD-10-CM | POA: Diagnosis not present

## 2020-11-26 DIAGNOSIS — F4310 Post-traumatic stress disorder, unspecified: Secondary | ICD-10-CM | POA: Diagnosis not present

## 2020-11-26 DIAGNOSIS — F329 Major depressive disorder, single episode, unspecified: Secondary | ICD-10-CM | POA: Diagnosis not present

## 2020-11-27 DIAGNOSIS — F329 Major depressive disorder, single episode, unspecified: Secondary | ICD-10-CM | POA: Diagnosis not present

## 2020-11-27 DIAGNOSIS — F419 Anxiety disorder, unspecified: Secondary | ICD-10-CM | POA: Diagnosis not present

## 2020-11-27 DIAGNOSIS — F4310 Post-traumatic stress disorder, unspecified: Secondary | ICD-10-CM | POA: Diagnosis not present

## 2020-11-27 DIAGNOSIS — F1020 Alcohol dependence, uncomplicated: Secondary | ICD-10-CM | POA: Diagnosis not present

## 2020-12-01 DIAGNOSIS — F1010 Alcohol abuse, uncomplicated: Secondary | ICD-10-CM | POA: Diagnosis not present

## 2020-12-01 DIAGNOSIS — E039 Hypothyroidism, unspecified: Secondary | ICD-10-CM | POA: Diagnosis not present

## 2020-12-01 DIAGNOSIS — F32A Depression, unspecified: Secondary | ICD-10-CM | POA: Diagnosis not present

## 2020-12-01 DIAGNOSIS — M5442 Lumbago with sciatica, left side: Secondary | ICD-10-CM | POA: Diagnosis not present

## 2020-12-04 ENCOUNTER — Other Ambulatory Visit: Payer: Self-pay

## 2020-12-04 ENCOUNTER — Emergency Department
Admission: EM | Admit: 2020-12-04 | Discharge: 2020-12-06 | Disposition: A | Discharge status: Other Acute Inpatient Hospital | Payer: Medicare HMO | Attending: Emergency Medicine | Admitting: Emergency Medicine

## 2020-12-04 DIAGNOSIS — F1010 Alcohol abuse, uncomplicated: Secondary | ICD-10-CM | POA: Insufficient documentation

## 2020-12-04 DIAGNOSIS — Z79899 Other long term (current) drug therapy: Secondary | ICD-10-CM | POA: Diagnosis not present

## 2020-12-04 DIAGNOSIS — F10939 Alcohol use, unspecified with withdrawal, unspecified: Secondary | ICD-10-CM | POA: Diagnosis present

## 2020-12-04 DIAGNOSIS — F4312 Post-traumatic stress disorder, chronic: Secondary | ICD-10-CM | POA: Diagnosis not present

## 2020-12-04 DIAGNOSIS — Z7289 Other problems related to lifestyle: Secondary | ICD-10-CM

## 2020-12-04 DIAGNOSIS — I10 Essential (primary) hypertension: Secondary | ICD-10-CM | POA: Insufficient documentation

## 2020-12-04 DIAGNOSIS — Y908 Blood alcohol level of 240 mg/100 ml or more: Secondary | ICD-10-CM | POA: Insufficient documentation

## 2020-12-04 DIAGNOSIS — F32A Depression, unspecified: Secondary | ICD-10-CM | POA: Diagnosis not present

## 2020-12-04 DIAGNOSIS — Z20822 Contact with and (suspected) exposure to covid-19: Secondary | ICD-10-CM | POA: Insufficient documentation

## 2020-12-04 DIAGNOSIS — E039 Hypothyroidism, unspecified: Secondary | ICD-10-CM | POA: Diagnosis not present

## 2020-12-04 DIAGNOSIS — F1020 Alcohol dependence, uncomplicated: Secondary | ICD-10-CM | POA: Diagnosis present

## 2020-12-04 DIAGNOSIS — Z789 Other specified health status: Secondary | ICD-10-CM

## 2020-12-04 DIAGNOSIS — F10239 Alcohol dependence with withdrawal, unspecified: Secondary | ICD-10-CM | POA: Diagnosis present

## 2020-12-04 LAB — ETHANOL: Alcohol, Ethyl (B): 381 mg/dL (ref <10)

## 2020-12-04 LAB — COMPREHENSIVE METABOLIC PANEL
ALT: 37 U/L (ref 0–44)
AST: 56 U/L — ABNORMAL HIGH (ref 15–41)
Albumin: 4.4 g/dL (ref 3.5–5.0)
Alkaline Phosphatase: 77 U/L (ref 38–126)
Anion gap: 15 (ref 5–15)
BUN: 13 mg/dL (ref 6–20)
CO2: 22 mmol/L (ref 22–32)
Calcium: 9 mg/dL (ref 8.9–10.3)
Chloride: 104 mmol/L (ref 98–111)
Creatinine, Ser: 0.83 mg/dL (ref 0.61–1.24)
GFR, Estimated: >60 mL/min (ref >60)
Glucose, Bld: 109 mg/dL — ABNORMAL HIGH (ref 70–99)
Potassium: 3.6 mmol/L (ref 3.5–5.1)
Sodium: 141 mmol/L (ref 135–145)
Total Bilirubin: 2.1 mg/dL — ABNORMAL HIGH (ref 0.3–1.2)
Total Protein: 8.4 g/dL — ABNORMAL HIGH (ref 6.5–8.1)

## 2020-12-04 LAB — CBC
HCT: 41.4 % (ref 39.0–52.0)
Hemoglobin: 14.3 g/dL (ref 13.0–17.0)
MCH: 28.8 pg (ref 26.0–34.0)
MCHC: 34.5 g/dL (ref 30.0–36.0)
MCV: 83.5 fL (ref 80.0–100.0)
Platelets: 189 10*3/uL (ref 150–400)
RBC: 4.96 MIL/uL (ref 4.22–5.81)
RDW: 18.2 % — ABNORMAL HIGH (ref 11.5–15.5)
WBC: 11.2 10*3/uL — ABNORMAL HIGH (ref 4.0–10.5)
nRBC: 0 % (ref 0.0–0.2)

## 2020-12-04 LAB — ACETAMINOPHEN LEVEL: Acetaminophen (Tylenol), Serum: <10 ug/mL — ABNORMAL LOW (ref 10–30)

## 2020-12-04 LAB — SALICYLATE LEVEL: Salicylate Lvl: <7 mg/dL — ABNORMAL LOW (ref 7.0–30.0)

## 2020-12-04 NOTE — ED Notes (Signed)
IVC 

## 2020-12-04 NOTE — ED Notes (Signed)
The following items placed in labeled bag: camo pants, black jacket, tan ball cap, red t shirt, navy shoes, blue boxers, loose change, black and white plastic bracelet, blue hospital band.

## 2020-12-04 NOTE — ED Provider Notes (Signed)
[facility] Emergency Department Provider Note   ____________________________________________   I have reviewed the triage vital signs and the nursing notes.   HISTORY  Chief Complaint SI  History limited by: Intoxication   HPI Ivan Hamilton is a 55 y.o. male who presents to the emergency department today with complaints for depression and suicidal ideation.  Patient states he wants to die.  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  Patient states that he has been drinking tonight.   Records reviewed. Per medical record review patient has a history of ER visits in the past for alcohol intoxication and suicidal ideation.  Past Medical History:  Diagnosis Date  . Alcohol withdrawal ([place])   . B12 deficiency   . Chronic pain   . CVA (cerebral vascular accident) ([place])   . Hypertension   . Hypothyroidism   . Varicose veins     Patient Active Problem List   Diagnosis Date Noted  . Severe major depression without psychotic features ([place]) 11/07/2020  . Generalized anxiety disorder 11/07/2020  . RUQ abdominal pain 05/17/2020  . Lip swelling   . Hypotension 01/10/2020  . Metabolic acidosis   . Hepatic cirrhosis ([place])   . Diverticulitis   . Transaminitis   . Syncope 07/11/2019  . Abdominal pain 07/11/2019  . Alcohol abuse 07/11/2019  . Thrombocytopenia ([place]) 07/11/2019  . Normocytic anemia 07/11/2019  . Tobacco abuse 07/11/2019  . HTN (hypertension) 07/11/2019  . CVA (cerebral vascular accident) ([place])   . Major psychotic depression, recurrent ([place]) 06/01/2019  . Hyperbilirubinemia 12/23/2018  . Suicidal behavior 08/28/2018  . Severe recurrent major depressive disorder with psychotic features ([place]) 08/28/2018  . Alcohol intoxication ([place]) 08/28/2018  . S/P laparoscopic cholecystectomy 11/02/2017  . Duodenitis   . Gastritis without bleeding   . Hypokalemia  10/22/2017  . Intractable vomiting with nausea   . Anxiety and depression 04/10/2016  . Alcohol withdrawal delirium ([place])   . Alcohol withdrawal ([place]) 09/02/2015  . Alcohol use disorder, severe, dependence ([place]) 09/02/2015  . 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[place]) 09/02/2015  . Chronic post-traumatic stress disorder (PTSD) 09/01/2015  . Hypothyroid 09/01/2015  . Gastric reflux 09/01/2015  . DDD (degenerative disc disease), lumbosacral 09/01/2015  . 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 Vitamin B12 deficiency 08/17/2015  . Pain of lumbar spine 05/19/2015  . Neuropathy 05/17/2015  . Varicose vein of leg 04/25/2015  . Abnormal LFTs (liver function tests) 03/31/2015  . Confusion state 03/31/2015  . Chronic pain of left lower extremity 03/31/2015    Past Surgical History:  Procedure Laterality Date  . CHOLECYSTECTOMY    . ESOPHAGOGASTRODUODENOSCOPY (EGD) WITH PROPOFOL N/A 10/24/2017   Procedure: ESOPHAGOGASTRODUODENOSCOPY (EGD) WITH PROPOFOL;  Surgeon: [name], MD;  Location: [facility] ENDOSCOPY;  Service: Endoscopy;  Laterality: N/A;  . ESOPHAGOGASTRODUODENOSCOPY (EGD) WITH PROPOFOL N/A 07/12/2019   Procedure: ESOPHAGOGASTRODUODENOSCOPY (EGD) WITH PROPOFOL;  Surgeon: Jonathon Bellows, MD;  Location: [facility] ENDOSCOPY;  Service: Gastroenterology;  Laterality: N/A;  . LEG SURGERY      Prior to Admission medications   Medication Sig Start Date End Date Taking? Authorizing Provider  acamprosate (CAMPRAL) 333 MG tablet Take 2 tablets (666 mg total) by mouth 3 (three) times daily with meals. 11/11/20   Salley Scarlet, MD  FLUoxetine (PROZAC) 40 MG capsule Take 2 capsules (80 mg total) by mouth daily.  11/11/20   Salley Scarlet, MD  gabapentin (NEURONTIN) 800 MG tablet Take 1 tablet (800 mg total) by mouth 3 (three) times daily. 11/11/20   Salley Scarlet, MD  hydrOXYzine (ATARAX/VISTARIL) 25 MG tablet Take 1 tablet (25 mg  total) by mouth every 8 (eight) hours as needed for itching. 11/11/20   Salley Scarlet, MD  levothyroxine (SYNTHROID) 125 MCG tablet Take 1 tablet (125 mcg total) by mouth daily at 6 (six) AM. 11/11/20   Salley Scarlet, MD  lisinopril (ZESTRIL) 5 MG tablet Take 1 tablet (5 mg total) by mouth daily. 11/12/20   Salley Scarlet, MD  pantoprazole (PROTONIX) 40 MG tablet Take 1 tablet (40 mg total) by mouth daily at 12 noon. 11/11/20   Salley Scarlet, MD  prazosin (MINIPRESS) 1 MG capsule Take 1 capsule (1 mg total) by mouth at bedtime. 11/11/20   Salley Scarlet, MD  QUEtiapine (SEROQUEL) 200 MG tablet Take 1 tablet (200 mg total) by mouth at bedtime. 11/11/20   Salley Scarlet, MD  thiamine 100 MG tablet Take 1 tablet (100 mg total) by mouth daily. 11/12/20   Salley Scarlet, MD  traZODone (DESYREL) 100 MG tablet Take 1 tablet (100 mg total) by mouth at bedtime as needed for sleep. 11/11/20   Salley Scarlet, MD    Allergies Patient has no known allergies.  No family history on file.  Social History Social History   Tobacco Use  . Smoking status: Never Smoker  . Smokeless tobacco: Never Used  Vaping Use  . Vaping Use: Never used  Substance Use Topics  . Alcohol use: Yes    Alcohol/week: 21.0 standard drinks    Types: 21 Shots of liquor per week    Comment: daily- 1 pint of vodka  . Drug use: No    Review of Systems Constitutional: No fever/chills Eyes: No visual changes. ENT: No sore throat. Cardiovascular: Denies chest pain. Respiratory: Denies shortness of breath. Gastrointestinal: No abdominal pain.  No nausea, no vomiting.  No diarrhea.   Genitourinary: Negative for dysuria. Musculoskeletal: Negative for back pain. Skin: Negative for rash. Neurological: Negative for headaches, focal weakness or numbness.  ____________________________________________   PHYSICAL EXAM:  VITAL SIGNS: ED Triage Vitals  Enc Vitals Group     BP 12/04/20 2050 (!) 131/96     Pulse Rate  12/04/20 2050 (!) 116     Resp 12/04/20 2050 20     Temp 12/04/20 2051 98.3 F (36.8 C)     Temp Source 12/04/20 2051 Oral     SpO2 12/04/20 2050 94 %     Weight 12/04/20 2101 163 lb (73.9 kg)     Height 12/04/20 2101 5\' 5"  (1.651 m)     Head Circumference --      Peak Flow --      Pain Score 12/04/20 2101 0   Constitutional: Alert and oriented.  Eyes: Conjunctivae are normal.  ENT      Head: Normocephalic and atraumatic.      Nose: No congestion/rhinnorhea.      Mouth/Throat: Mucous membranes are moist.      Neck: No stridor. Hematological/Lymphatic/Immunilogical: No cervical lymphadenopathy. Cardiovascular: Normal rate, regular rhythm.  No murmurs, rubs, or gallops.  Respiratory: Normal respiratory effort without tachypnea nor retractions. Breath sounds are clear and equal bilaterally. No wheezes/rales/rhonchi. Gastrointestinal: Soft and non tender. No rebound. No guarding.  Genitourinary: Deferred Musculoskeletal: Normal range of motion in all extremities. No lower extremity edema.  Neurologic:  Slightly intoxicated appearing. Normal speech and language. No gross focal neurologic deficits are appreciated.  Skin:  Skin is warm, dry and intact. No rash noted. Psychiatric: Tearful. Labile. Endorses SI.  ____________________________________________    LABS (pertinent positives/negatives)  CBC wbc 56.2 Ethanol 130 Salicylate, acetaminophen below threshold CMP na 141, k 3.6, glu 109, cr 0.83 ____________________________________________   EKG  None  ____________________________________________    RADIOLOGY  None  ____________________________________________   PROCEDURES  Procedures  ____________________________________________   INITIAL IMPRESSION / ASSESSMENT AND PLAN / ED COURSE  Pertinent labs & imaging results that were available during my care of the patient were reviewed by me and considered in my medical decision making (see chart for details).    Patient presented to the emergency department today because of concerns for suicidal ideation.  Patient did present under IVC.  Does appear somewhat intoxicated here although states he is depressed and wants to kill himself.  Per chart review patient has had similar presentations to the emergency department.  At this time will continue IVC.  The patient has been placed in psychiatric observation due to the need to provide a safe environment for the patient while obtaining psychiatric consultation and evaluation, as well as ongoing medical and medication management to treat the patient's condition.  The patient has been placed under full IVC at this time.  ____________________________________________   FINAL CLINICAL IMPRESSION(S) / ED DIAGNOSES  Final diagnoses:  Depression, unspecified depression type  Alcohol use     Note: This dictation was prepared with Dragon dictation. Any transcriptional errors that result from this process are unintentional     Nance Pear, MD 12/04/20 2250

## 2020-12-04 NOTE — ED Triage Notes (Signed)
Pt states he wants to kill himself. Pt arrives with IVC papers with [employer]. Pt with ETOH ingestion. Pt states he drank two bottles of tequila.

## 2020-12-04 NOTE — ED Notes (Signed)
Pt dressed out by zach ed tech.

## 2020-12-04 NOTE — ED Notes (Signed)
Pt reports he is wanting to die. 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 Pt states he hears and sees those people. Pt states he would place a gun under his chin and shoot himself to die. Pt states to this RN "will you help me to die? I don't want to be alive anymore". RN provided emotional counseling for pt. Pt tearful during conversation. When asked about a support system at home, pt says he has some help with ex wife but that both of his children and grandchildren moved elsewhere and he misses his grandchildren.

## 2020-12-05 DIAGNOSIS — F10939 Alcohol use, unspecified with withdrawal, unspecified: Secondary | ICD-10-CM | POA: Diagnosis not present

## 2020-12-05 DIAGNOSIS — F4312 Post-traumatic stress disorder, chronic: Secondary | ICD-10-CM | POA: Diagnosis not present

## 2020-12-05 DIAGNOSIS — F32A Depression, unspecified: Secondary | ICD-10-CM | POA: Diagnosis not present

## 2020-12-05 LAB — URINE DRUG SCREEN, QUALITATIVE (ARMC ONLY)
Amphetamines, Ur Screen: NOT DETECTED
Barbiturates, Ur Screen: NOT DETECTED
Benzodiazepine, Ur Scrn: POSITIVE — AB
Cannabinoid 50 Ng, Ur ~~LOC~~: NOT DETECTED
Cocaine Metabolite,Ur ~~LOC~~: NOT DETECTED
MDMA (Ecstasy)Ur Screen: NOT DETECTED
Methadone Scn, Ur: NOT DETECTED
Opiate, Ur Screen: NOT DETECTED
Phencyclidine (PCP) Ur S: NOT DETECTED
Tricyclic, Ur Screen: NOT DETECTED

## 2020-12-05 LAB — RESP PANEL BY RT-PCR (FLU A&B, COVID) ARPGX2
Influenza A by PCR: NEGATIVE
Influenza B by PCR: NEGATIVE
SARS Coronavirus 2 by RT PCR: NEGATIVE

## 2020-12-05 MED ORDER — PANTOPRAZOLE SODIUM 40 MG PO TBEC: 40.0000 mg | DELAYED_RELEASE_TABLET | Freq: Every day | ORAL | Status: DC

## 2020-12-05 MED ORDER — LISINOPRIL 5 MG PO TABS
5.0000 mg | ORAL_TABLET | Freq: Every day | ORAL | Status: DC
  Administered 2020-12-05 – 2020-12-06 (×2): 5 mg via ORAL
  Filled 2020-12-05 (×2): qty 1

## 2020-12-05 MED ORDER — CHLORDIAZEPOXIDE HCL 25 MG PO CAPS
50.0000 mg | ORAL_CAPSULE | ORAL | Status: AC
  Administered 2020-12-05: 50 mg via ORAL
  Filled 2020-12-05: qty 2

## 2020-12-05 MED ORDER — GABAPENTIN 800 MG PO TABS
800.0000 mg | ORAL_TABLET | Freq: Three times a day (TID) | ORAL | Status: DC
  Filled 2020-12-05 (×3): qty 1

## 2020-12-05 MED ORDER — LEVOTHYROXINE SODIUM 75 MCG PO TABS
125.0000 ug | ORAL_TABLET | Freq: Every day | ORAL | Status: DC
  Administered 2020-12-05 – 2020-12-06 (×2): 125 ug via ORAL
  Filled 2020-12-05: qty 2
  Filled 2020-12-05: qty 3

## 2020-12-05 MED ORDER — GABAPENTIN 300 MG PO CAPS
800.0000 mg | ORAL_CAPSULE | Freq: Three times a day (TID) | ORAL | Status: DC
  Administered 2020-12-05 – 2020-12-06 (×4): 800 mg via ORAL
  Filled 2020-12-05 (×4): qty 2

## 2020-12-05 MED ORDER — HYDROXYZINE HCL 25 MG PO TABS
25.0000 mg | ORAL_TABLET | Freq: Three times a day (TID) | ORAL | Status: DC | PRN
  Administered 2020-12-05: 25 mg via ORAL
  Filled 2020-12-05: qty 1

## 2020-12-05 MED ORDER — THIAMINE HCL 100 MG PO TABS
100.0000 mg | ORAL_TABLET | Freq: Every day | ORAL | Status: DC
  Administered 2020-12-05 – 2020-12-06 (×2): 100 mg via ORAL
  Filled 2020-12-05 (×2): qty 1

## 2020-12-05 MED ORDER — QUETIAPINE FUMARATE 200 MG PO TABS
200.0000 mg | ORAL_TABLET | Freq: Every day | ORAL | Status: DC
  Administered 2020-12-05: 200 mg via ORAL
  Filled 2020-12-05: qty 1

## 2020-12-05 MED ORDER — PRAZOSIN HCL 1 MG PO CAPS
1.0000 mg | ORAL_CAPSULE | Freq: Every day | ORAL | Status: DC
  Administered 2020-12-05: 1 mg via ORAL
  Filled 2020-12-05 (×3): qty 1

## 2020-12-05 MED ORDER — IBUPROFEN 600 MG PO TABS
600.0000 mg | ORAL_TABLET | ORAL | Status: AC
  Administered 2020-12-05: 600 mg via ORAL
  Filled 2020-12-05: qty 1

## 2020-12-05 MED ORDER — FLUOXETINE HCL 20 MG PO CAPS
80.0000 mg | ORAL_CAPSULE | Freq: Every day | ORAL | Status: DC
  Administered 2020-12-05 – 2020-12-06 (×2): 80 mg via ORAL
  Filled 2020-12-05 (×2): qty 4

## 2020-12-05 MED ORDER — TRAZODONE HCL 100 MG PO TABS
100.0000 mg | ORAL_TABLET | Freq: Every evening | ORAL | Status: DC | PRN
  Administered 2020-12-05: 100 mg via ORAL
  Filled 2020-12-05: qty 1

## 2020-12-05 NOTE — ED Notes (Signed)
Pt reports SI with plan to shoot self with rifle. 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

## 2020-12-05 NOTE — ED Notes (Signed)
Gave pt lunch tray with juice. 

## 2020-12-05 NOTE — BH Assessment (Signed)
Adult MH  Referral information for Psychiatric Hospitalization faxed to:   [facility] ([phone]-or- [phone]),   [facility] ([phone]),   [facility] ([phone] -or- [phone]),   [facility] [phone] -or- [phone]) [phone]   [facility] ([phone]---[phone]---[phone]),   [facility] ([phone] or [phone])   [facility] ([phone] or [phone]),   [facility] ([phone]). 

## 2020-12-05 NOTE — BH Assessment (Signed)
Comprehensive Clinical Assessment (CCA) Note  12/05/2020 Ivan Hamilton [id number]   Arthor Captain, 55 year old male who presents to [facility] ED involuntarily for treatment. Per triage note, Pt states he wants to kill himself. Pt arrives with IVC papers with BPD. Pt with ETOH ingestion. Pt states he drank two bottles of tequila.   During TTS assessment pt presents alert and oriented x 4, anxious but cooperative, and mood-congruent with affect. The pt does not appear to be responding to internal or external stimuli. Neither is the pt presenting with any delusional thinking. Pt verified the information provided to triage RN.   Pt identifies his main complaint to be that he is suicidal and has a drinking problem. Patient states he has PTSD from being in the [employer] in [place] and drinks when he becomes overwhelmed with night terrors. Patient reports having a suicide attempt yesterday with a gun; however, the authorities were able to talk him down and bring him to the ED. " I held the rifle to my chin and I was going to pull the trigger." Patient was tearful and trembling during the interview. Pt reports recent INPT hx at [facility] and OPT hx at [facility]. Patient reports he does not sleep well at night as any sound wakes him up. Patient is depressed and hopeless. Pt endorses SI/AH/VH. " I see and talk to people but they are not really there." Pt is unable to contract for safety.    Per Dr. Weber Cooks pt is recommended for psychiatric inpatient admission.    Chief Complaint:  Chief Complaint  Patient presents with  . Suicidal   Visit Diagnosis: Chronic post-traumatic stress disorder (PTSD)    CCA Screening, Triage and Referral (STR)  Patient Reported Information How did you hear about [place]? Legal System  Referral name: No data recorded Referral phone number: No data recorded  Whom do you see for routine medical problems? -- (Unknown)  Practice/Facility Name: No data  recorded Practice/Facility Phone Number: No data recorded Name of Contact: No data recorded Contact Number: No data recorded Contact Fax Number: No data recorded Prescriber Name: No data recorded Prescriber Address (if known): No data recorded  What Is the Reason for Your Visit/Call Today? Patient reports he is suicidal and attempted to kill himself with his rifle. Patient also reports he has been drinking.  How Long Has This Been Causing You Problems? > than 6 months  What Do You Feel Would Help You the Most Today? Alcohol or Drug Use Treatment; Treatment for Depression or other mood problem; Medication(s)   Have You Recently Been in Any Inpatient Treatment (Hospital/Detox/Crisis Center/28-Day Program)? Yes  Name/Location of Program/Hospital:[facility]  How Long Were You There? 7 days  When Were You Discharged? 05/18/2020   Have You Ever Received Services From [employer] Before? Yes  Who Do You See at [facility]? Mental [facility] Medical Treatment   Have You Recently Had Any Thoughts About Hurting Yourself? Yes  Are You Planning to Commit Suicide/Harm Yourself At This time? No   Have you Recently Had Thoughts About [place]? No  Explanation: No data recorded  Have You Used Any Alcohol or Drugs in the Past 24 Hours? Yes  How Long Ago Did You Use Drugs or Alcohol? 2300  What Did You Use and How Much? Alcohol   Do You Currently Have a Therapist/Psychiatrist? Yes  Name of Therapist/Psychiatrist: [facility]   Have You Been Recently Discharged From Any Office Practice or Programs?  Yes  Explanation of Discharge From Practice/Program: Patient reports his insurance would only pay for him to stay for 18 days.     CCA Screening Triage Referral Assessment Type of Contact: Face-to-Face  Is this Initial or Reassessment? No data recorded Date Telepsych consult ordered in [facility]:  No data recorded Time Telepsych consult ordered in [facility]:  No data  recorded  Patient Reported Information Reviewed? Yes  Patient Left Without Being Seen? No data recorded Reason for Not Completing Assessment: No data recorded  Collateral Involvement: None provided   Does Patient Have a [place]? No data recorded Name and Contact of Legal Guardian: Self  If Minor and Not Living with Parent(s), Who has Custody? n/a  Is CPS involved or ever been involved? Never  Is APS involved or ever been involved? Never   Patient Determined To Be At Risk for Harm To Self or Others Based on Review of Patient Reported Information or Presenting Complaint? Yes, for Self-Harm  Method: No data recorded Availability of Means: No data recorded Intent: No data recorded Notification Required: No data recorded Additional Information for Danger to Others Potential: No data recorded Additional Comments for Danger to Others Potential: No data recorded Are There Guns or Other Weapons in Your Home? No data recorded Types of Guns/Weapons: No data recorded Are These Weapons Safely Secured?                            No data recorded Who Could Verify You Are Able To Have These Secured: No data recorded Do You Have any Outstanding Charges, Pending Court Dates, Parole/Probation? No data recorded Contacted To Inform of Risk of Harm To Self or Others: Law Enforcement   Location of Assessment: [facility] ED   Does Patient Present under Involuntary Commitment? Yes  IVC Papers Initial File Date: 12/05/2020   [place] of Residence: [place]   Patient Currently Receiving the Following Services: Individual Therapy   Determination of Need: Emergent (2 hours)   Options For Referral: ED Visit; Inpatient Hospitalization; Medication Management     CCA Biopsychosocial Intake/Chief Complaint:  Patient presented to ER with suicidal ideations and alcohol intoxication  Current Symptoms/Problems: Patient reports having PTSD, depression and has been  drinking.   Patient Reported Schizophrenia/Schizoaffective Diagnosis in Past: No   Strengths: unknown  Preferences: unknown  Abilities: unknown   Type of Services Patient Feels are Needed: IP detox or treatment   Initial Clinical Notes/Concerns: No data recorded  Mental Health Symptoms Depression:  Change in energy/activity; Worthlessness; Hopelessness; Tearfulness   Duration of Depressive symptoms: Greater than two weeks   Mania:  None   Anxiety:   Restlessness; Worrying   Psychosis:  Hallucinations   Duration of Psychotic symptoms: Less than six months   Trauma:  Re-experience of traumatic event; Emotional numbing; Difficulty staying/falling asleep; Detachment from others   Obsessions:  Poor insight   Compulsions:  N/A   Inattention:  N/A   Hyperactivity/Impulsivity:  Fidgets with hands/feet   Oppositional/Defiant Behaviors:  N/A   Emotional Irregularity:  Potentially harmful impulsivity; Recurrent suicidal behaviors/gestures/threats; Intense/unstable relationships   Other Mood/Personality Symptoms:  No data recorded   Mental Status Exam Appearance and self-care  Stature:  Average   Weight:  Average weight   Clothing:  Casual   Grooming:  Normal   Cosmetic use:  None   Posture/gait:  Normal   Motor activity:  Restless; Tremor   Sensorium  Attention:  Normal  Concentration:  Normal   Orientation:  X5   Recall/memory:  Normal   Affect and Mood  Affect:  Anxious; Depressed; Tearful   Mood:  Depressed; Anxious; Hopeless   Relating  Eye contact:  Normal   Facial expression:  Depressed; Anxious   Attitude toward examiner:  Cooperative   Thought and Language  Speech flow: Pressured   Thought content:  Appropriate to Mood and Circumstances   Preoccupation:  Guilt   Hallucinations:  Auditory; Visual   Organization:  No data recorded  [employer] of Knowledge:  Average   Intelligence:  Average   Abstraction:   Normal   Judgement:  Poor   Reality Testing:  Distorted   Insight:  Poor   Decision Making:  Impulsive   Social Functioning  Social Maturity:  Irresponsible; Impulsive   Social Judgement:  Victimized   Stress  Stressors:  Housing; Grief/losses; Relationship; Family conflict   Coping Ability:  Overwhelmed   Skill Deficits:  Communication; Decision making   Supports:  Family     Religion:    Leisure/Recreation:    Exercise/Diet: Exercise/Diet Do You Exercise?: No Have You Gained or Lost A Significant Amount of Weight in the Past Six Months?: No Do You Follow a Special Diet?: No Do You Have Any Trouble Sleeping?: Yes Explanation of Sleeping Difficulties: Patient reports he wakes up easily.   CCA Employment/Education Employment/Work Situation: Employment / Work Situation Employment situation: On disability Why is patient on disability: Mental health and injuries Has patient ever been in the [employer]?: Yes (Describe in comment)  Education: Education Is Patient Currently Attending School?: No   CCA Family/Childhood History Family and Relationship History: Family history Marital status: Separated What types of issues is patient dealing with in the relationship?: Patient reports his daughters and wife do not want to be around him when he drinks. What is your sexual orientation?: "women" Does patient have children?: Yes How many children?: 2  Childhood History:     Child/Adolescent Assessment:     CCA Substance Use Alcohol/Drug Use: Alcohol / Drug Use Pain Medications: See PTA Prescriptions: See PTA Over the Counter: See PTA History of alcohol / drug use?: Yes Longest period of sobriety (when/how long): unknown Negative Consequences of Use: Personal relationships,Work / School Withdrawal Symptoms: Agitation,Tremors,Sweats Substance #1 Name of Substance 1: Alcohol 1 - Age of First Use: unknown 1 - Last Use / Amount: 12/04/20 1 - Method of  Aquiring: Purchased 1- Route of Use: oral                       ASAM's:  Six Dimensions of Multidimensional Assessment  Dimension 1:  Acute Intoxication and/or Withdrawal Potential:   Dimension 1:  Description of individual's past and current experiences of substance use and withdrawal: Patient has had past medical admission due to withdrawals  Dimension 2:  Biomedical Conditions and Complications:   Dimension 2:  Description of patient's biomedical conditions and  complications: Patient has medical concerns where his drinking interferes  Dimension 3:  Emotional, Behavioral, or Cognitive Conditions and Complications:  Dimension 3:  Description of emotional, behavioral, or cognitive conditions and complications: Due to patient's history of PTSD  Dimension 4:  Readiness to Change:  Dimension 4:  Description of Readiness to Change criteria: Patient is willing to go into treatment without family pressuring him to  Dimension 5:  Relapse, Continued use, or Continued Problem Potential:  Dimension 5:  Relapse, continued use, or continued problem  potential critiera description: Chronic history of relapse  Dimension 6:  Recovery/Living Environment:  Dimension 6:  Recovery/Iiving environment criteria description: Family is supportive and doesn't want pt to use but he continues to have access to it  ASAM Severity Score: ASAM's Severity Rating Score: 12  ASAM Recommended Level of Treatment:     Substance use Disorder (SUD) Substance Use Disorder (SUD)  Checklist Symptoms of Substance Use: Continued use despite having a persistent/recurrent physical/psychological problem caused/exacerbated by use,Evidence of withdrawal (Comment),Evidence of tolerance,Persistent desire or unsuccessful efforts to cut down or control use,Presence of craving or strong urge to use,Substance(s) often taken in larger amounts or over longer times than was intended  Recommendations for  Services/Supports/Treatments: Recommendations for Services/Supports/Treatments Recommendations For Services/Supports/Treatments: Inpatient Hospitalization,SAIOP (Substance Abuse Intensive Outpatient Program),Medication Management  DSM5 Diagnoses: Patient Active Problem List   Diagnosis Date Noted  . Severe major depression without psychotic features ([place]) 11/07/2020  . Generalized anxiety disorder 11/07/2020  . RUQ abdominal pain 05/17/2020  . Lip swelling   . Hypotension 01/10/2020  . Metabolic acidosis   . Hepatic cirrhosis ([place])   . Diverticulitis   . Transaminitis   . Syncope 07/11/2019  . Abdominal pain 07/11/2019  . Alcohol abuse 07/11/2019  . Thrombocytopenia ([place]) 07/11/2019  . Normocytic anemia 07/11/2019  . Tobacco abuse 07/11/2019  . HTN (hypertension) 07/11/2019  . CVA (cerebral vascular accident) ([place])   . Major psychotic depression, recurrent ([place]) 06/01/2019  . Hyperbilirubinemia 12/23/2018  . Suicidal behavior 08/28/2018  . Severe recurrent major depressive disorder with psychotic features ([place]) 08/28/2018  . Alcohol intoxication ([place]) 08/28/2018  . S/P laparoscopic cholecystectomy 11/02/2017  . Duodenitis   . Gastritis without bleeding   . Hypokalemia 10/22/2017  . Intractable vomiting with nausea   . Anxiety and depression 04/10/2016  . Alcohol withdrawal delirium ([place])   . Alcohol withdrawal ([place]) 09/02/2015  . Alcohol use disorder, severe, dependence ([place]) 09/02/2015  . 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 Chronic post-traumatic stress disorder (PTSD) 09/01/2015  . Hypothyroid 09/01/2015  . Gastric reflux 09/01/2015  . DDD (degenerative disc disease), lumbosacral 09/01/2015  . 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 Vitamin B12 deficiency 08/17/2015  . Pain of lumbar spine 05/19/2015  . Neuropathy 05/17/2015  . Varicose vein of leg 04/25/2015  . Abnormal LFTs  (liver function tests) 03/31/2015  . Confusion state 03/31/2015  . Chronic pain of left lower extremity 03/31/2015    Patient Centered Plan: Patient is on the following Treatment Plan(s):  Post Traumatic Stress Disorder   Referrals to Alternative Service(s): Referred to Alternative Service(s):   Place:   Date:   Time:    Referred to Alternative Service(s):   Place:   Date:   Time:    Referred to Alternative Service(s):   Place:   Date:   Time:    Referred to Alternative Service(s):   Place:   Date:   Time:     Ivan Hamilton, Counselor, LCAS-A

## 2020-12-05 NOTE — ED Notes (Signed)
IVC, pend placement 

## 2020-12-05 NOTE — ED Provider Notes (Signed)
Emergency Medicine Observation Re-evaluation Note  Ivan Hamilton is a 55 y.o. male, seen on rounds today.  Pt initially presented to the ED for complaints of No chief complaint on file. Currently, the patient is resting comfortably.  Physical Exam  BP (!) 131/96   Pulse (!) 116   Temp 98.3 F (36.8 C) (Oral)   Resp 20   Ht 5\' 5"  (1.651 m)   Wt 73.9 kg   SpO2 94%   BMI 27.12 kg/m  Physical Exam Gen: No acute distress  Resp: Normal rise and fall of chest Neuro: Moving all four extremities Psych: Resting currently, calm and cooperative when awake    ED Course / MDM  EKG:   I have reviewed the labs performed to date as well as medications administered while in observation.  Recent changes in the last 24 hours include no acute events overnight.  Plan  Current plan is for awaiting psychiatric evaluation for disposition. Patient is under full IVC at this time.   Marisah Laker, Delice Bison, DO 12/05/20 [phone]

## 2020-12-05 NOTE — Consult Note (Signed)
[facility] Consult   Reason for Consult: Consult for 55 year old man with a history of PTSD and alcohol abuse comes to the hospital with suicidal ideation Referring Physician: Siadecki Patient Identification: Ivan Hamilton MRN:  [id number] Principal Diagnosis: Chronic post-traumatic stress disorder (PTSD) Diagnosis:  Principal Problem:   Chronic post-traumatic stress disorder (PTSD) Active Problems:   Hypothyroid   Alcohol withdrawal ([place])   Alcohol use disorder, severe, dependence ([place])   Total Time spent with patient: 1 hour  Subjective:   Ivan Hamilton is a 55 y.o. male patient admitted with "I just cannot thinking about everything".  HPI: Patient seen chart reviewed.  55 year old man known from previous encounters.  He has a long history of alcohol abuse and PTSD.  He comes into the hospital last night very intoxicated with a blood alcohol level of 381.  Actively reporting suicidal thoughts with thoughts of shooting himself with his gun which he does have at home.  Patient just got out of [facility] 2 days ago.  After coming back home he says he was all right overnight but on Sunday he went to church.  This experience made him feel even more guilty.  He was overwhelmed with intrusive thoughts about the people he had been responsible for killing and the death he had seen in the war in [place].  He started to have suicidal thoughts of shooting himself.  He consumed by his report 2 bottles of tequila which just worsened his mood and his behavior.  Patient is tremulous but lucid.  Tearful.  Reasonable insight but very depressed.  He did go to [facility] treatment center immediately from his most recent hospitalization and says that he was there for 18 days but they told him that his insurance was run out and by his account they made him leave that 18 days even though he was asking to stay longer.  Past Psychiatric History: Patient has a long history of  alcohol abuse and PTSD.  He has had alcohol withdrawal seizures in the past.  He has had delirium tremens in the past.  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 Alcohol withdrawal ([place])   . B12 deficiency   . Chronic pain   . CVA (cerebral vascular accident) ([place])   . Hypertension   . Hypothyroidism   . Varicose veins     Past Surgical History:  Procedure Laterality Date  . CHOLECYSTECTOMY    . ESOPHAGOGASTRODUODENOSCOPY (EGD) WITH PROPOFOL N/A 10/24/2017   Procedure: ESOPHAGOGASTRODUODENOSCOPY (EGD) WITH PROPOFOL;  Surgeon: [name], MD;  Location: [facility] ENDOSCOPY;  Service: Endoscopy;  Laterality: N/A;  . ESOPHAGOGASTRODUODENOSCOPY (EGD) WITH PROPOFOL N/A 07/12/2019   Procedure: ESOPHAGOGASTRODUODENOSCOPY (EGD) WITH PROPOFOL;  Surgeon: Jonathon Bellows, MD;  Location: [facility] ENDOSCOPY;  Service: Gastroenterology;  Laterality: N/A;  . LEG SURGERY     Family History: No family history on file. Family Psychiatric  History: He has told me that his father had an alcohol problem as well cial History:  Social History   Substance and Sexual Activity  Alcohol Use Yes  . Alcohol/week: 21.0 standard drinks  . Types: 21 Shots of liquor per week   Comment:  daily- 1 pint of vodka     Social History   Substance and Sexual Activity  Drug Use No    Social History   Socioeconomic History  . Marital status: Married    Spouse name: Not on file  . Number of children: Not on file  . Years of education: Not on file  . Highest education level: Not on file  Occupational History  . Not on file  Tobacco Use  . Smoking status: Never Smoker  . Smokeless tobacco: Never Used  Vaping Use  . Vaping Use:  Never used  Substance and Sexual Activity  . Alcohol use: Yes    Alcohol/week: 21.0 standard drinks    Types: 21 Shots of liquor per week    Comment: daily- 1 pint of vodka  . Drug use: No  . Sexual activity: Not on file  Other Topics Concern  . Not on file  Social History Narrative  . Not on file   Social Determinants of Health   Financial Resource Strain: Not on file  Food Insecurity: Not on file  Transportation Needs: Not on file  Physical Activity: Not on file  Stress: Not on file  Social Connections: Not on file   Additional Social History:    Allergies:  No Known Allergies  Labs:  Results for orders placed or performed during the hospital encounter of 12/04/20 (from the past 48 hour(s))  Comprehensive metabolic panel     Status: Abnormal   Collection Time: 12/04/20  9:04 PM  Result Value Ref Range   Sodium 141 135 - 145 mmol/L   Potassium 3.6 3.5 - 5.1 mmol/L   Chloride 104 98 - 111 mmol/L   CO2 22 22 - 32 mmol/L   Glucose, Bld 109 (H) 70 - 99 mg/dL    Comment: Glucose reference range applies only to samples taken after fasting for at least 8 hours.   BUN 13 6 - 20 mg/dL   Creatinine, Ser 0.83 0.61 - 1.24 mg/dL   Calcium 9.0 8.9 - 10.3 mg/dL   Total Protein 8.4 (H) 6.5 - 8.1 g/dL   Albumin 4.4 3.5 - 5.0 g/dL   AST 56 (H) 15 - 41 U/L   ALT 37 0 - 44 U/L   Alkaline Phosphatase 77 38 - 126 U/L   Total Bilirubin 2.1 (H) 0.3 - 1.2 mg/dL   GFR, Estimated >60 >60 mL/min    Comment: (NOTE) Calculated using the CKD-EPI Creatinine Equation (2021)    Anion gap 15 5 - 15    Comment: Performed at [facility], [address] [place], [place] [address]  Ethanol     Status: Abnormal   Collection Time: 12/04/20  9:04 PM  Result Value Ref Range   Alcohol, Ethyl (B) 381 (HH) <10 mg/dL    Comment: CRITICAL RESULT CALLED TO, READ BACK BY AND VERIFIED WITH [place] SHEETS @2212  ON 12/04/20 SK[facility] (NOTE) Lowest detectable limit for serum alcohol is 10 mg/dL.  For  medical purposes only. Performed at [facility], [address] [place], [place] [address]   Salicylate level     Status: Abnormal   Collection Time: 12/04/20  9:04 PM  Result Value Ref Range   Salicylate Lvl <1.7 (L) 7.0 - 30.0 mg/dL    Comment: Performed at [facility], [address] [place], [place] [address]  Acetaminophen level     Status: Abnormal   Collection Time: 12/04/20  9:04 PM  Result Value Ref Range  Acetaminophen (Tylenol), Serum <10 (L) 10 - 30 ug/mL    Comment: (NOTE) Therapeutic concentrations vary significantly. A range of 10-30 ug/mL  may be an effective concentration for many patients. However, some  are best treated at concentrations outside of this range. Acetaminophen concentrations >150 ug/mL at 4 hours after ingestion  and >50 ug/mL at 12 hours after ingestion are often associated with  toxic reactions.  Performed at [facility], [address] [place], [place] [address]   cbc     Status: Abnormal   Collection Time: 12/04/20  9:04 PM  Result Value Ref Range   WBC 11.2 (H) 4.0 - 10.5 K/uL   RBC 4.96 4.22 - 5.81 MIL/uL   Hemoglobin 14.3 13.0 - 17.0 g/dL   HCT 41.4 39.0 - 52.0 %   MCV 83.5 80.0 - 100.0 fL   MCH 28.8 26.0 - 34.0 pg   MCHC 34.5 30.0 - 36.0 g/dL   RDW 18.2 (H) 11.5 - 15.5 %   Platelets 189 150 - 400 K/uL   nRBC 0.0 0.0 - 0.2 %    Comment: Performed at [facility], [address] [place], [place] [address]  Urine Drug Screen, Qualitative     Status: Abnormal   Collection Time: 12/05/20  9:20 AM  Result Value Ref Range   Tricyclic, Ur Screen NONE DETECTED NONE DETECTED   Amphetamines, Ur Screen NONE DETECTED NONE DETECTED   MDMA (Ecstasy)Ur Screen NONE DETECTED NONE DETECTED   Cocaine Metabolite,Ur [place] NONE DETECTED NONE DETECTED   Opiate, Ur Screen NONE DETECTED NONE DETECTED   Phencyclidine (PCP) Ur S NONE DETECTED NONE DETECTED   Cannabinoid 50 Ng, Ur [place] NONE DETECTED NONE DETECTED    Barbiturates, Ur Screen NONE DETECTED NONE DETECTED   Benzodiazepine, Ur Scrn POSITIVE (A) NONE DETECTED   Methadone Scn, Ur NONE DETECTED NONE DETECTED    Comment: (NOTE) Tricyclics + metabolites, urine    Cutoff 1000 ng/mL Amphetamines + metabolites, urine  Cutoff 1000 ng/mL MDMA (Ecstasy), urine              Cutoff 500 ng/mL Cocaine Metabolite, urine          Cutoff 300 ng/mL Opiate + metabolites, urine        Cutoff 300 ng/mL Phencyclidine (PCP), urine         Cutoff 25 ng/mL Cannabinoid, urine                 Cutoff 50 ng/mL Barbiturates + metabolites, urine  Cutoff 200 ng/mL Benzodiazepine, urine              Cutoff 200 ng/mL Methadone, urine                   Cutoff 300 ng/mL  The urine drug screen provides only a preliminary, unconfirmed analytical test result and should not be used for non-medical purposes. Clinical consideration and professional judgment should be applied to any positive drug screen result due to possible interfering substances. A more specific alternate chemical method must be used in order to obtain a confirmed analytical result. Gas chromatography / mass spectrometry (GC/MS) is the preferred confirm atory method. Performed at [facility], [address] [place],  [address]   Resp Panel by RT-PCR (Flu A&B, Covid) Nasopharyngeal Swab     Status: None   Collection Time: 12/05/20 11:21 AM   Specimen: Nasopharyngeal Swab; Nasopharyngeal(NP) swabs in vial transport medium  Result Value Ref Range   SARS Coronavirus  2 by RT PCR NEGATIVE NEGATIVE    Comment: (NOTE) SARS-CoV-2 target nucleic acids are NOT DETECTED.  The SARS-CoV-2 RNA is generally detectable in upper respiratory specimens during the acute phase of infection. The lowest concentration of SARS-CoV-2 viral copies this assay can detect is 138 copies/mL. A negative result does not preclude SARS-Cov-2 infection and should not be used as the sole basis for treatment or other  patient management decisions. A negative result may occur with  improper specimen collection/handling, submission of specimen other than nasopharyngeal swab, presence of viral mutation(s) within the areas targeted by this assay, and inadequate number of viral copies(<138 copies/mL). A negative result must be combined with clinical observations, patient history, and epidemiological information. The expected result is Negative.  Fact Sheet for Patients:  EntrepreneurPulse.com.au  Fact Sheet for Healthcare Providers:  IncredibleEmployment.be  This test is no t yet approved or cleared by the [place] FDA and  has been authorized for detection and/or diagnosis of SARS-CoV-2 by FDA under an Emergency Use Authorization (EUA). This EUA will remain  in effect (meaning this test can be used) for the duration of the COVID-19 declaration under Section 564(b)(1) of the Act, 21 U.S.C.section 360bbb-3(b)(1), unless the authorization is terminated  or revoked sooner.       Influenza A by PCR NEGATIVE NEGATIVE   Influenza B by PCR NEGATIVE NEGATIVE    Comment: (NOTE) The Xpert Xpress SARS-CoV-2/FLU/RSV plus assay is intended as an aid in the diagnosis of influenza from Nasopharyngeal swab specimens and should not be used as a sole basis for treatment. Nasal washings and aspirates are unacceptable for Xpert Xpress SARS-CoV-2/FLU/RSV testing.  Fact Sheet for Patients: EntrepreneurPulse.com.au  Fact Sheet for Healthcare Providers: IncredibleEmployment.be  This test is not yet approved or cleared by the [place] FDA and has been authorized for detection and/or diagnosis of SARS-CoV-2 by FDA under an Emergency Use Authorization (EUA). This EUA will remain in effect (meaning this test can be used) for the duration of the COVID-19 declaration under Section 564(b)(1) of the Act, 21 U.S.C. section 360bbb-3(b)(1), unless  the authorization is terminated or revoked.  Performed at [facility], [address], [place], [place] [address]     Current Facility-Administered Medications  Medication Dose Route Frequency Provider Last Rate Last Admin  . FLUoxetine (PROZAC) capsule 80 mg  80 mg Oral Daily Ward, Kristen N, DO   80 mg at 12/05/20 1115  . gabapentin (NEURONTIN) capsule 800 mg  800 mg Oral TID Ward, Kristen N, DO   800 mg at 12/05/20 1114  . hydrOXYzine (ATARAX/VISTARIL) tablet 25 mg  25 mg Oral Q8H PRN Ward, Delice Bison, DO      . levothyroxine (SYNTHROID) tablet 125 mcg  125 mcg Oral Q0600 Ward, Kristen N, DO   125 mcg at 12/05/20 0604  . lisinopril (ZESTRIL) tablet 5 mg  5 mg Oral Daily Ward, Kristen N, DO   5 mg at 12/05/20 1115  . pantoprazole (PROTONIX) EC tablet 40 mg  40 mg Oral Q1200 Ward, Kristen N, DO      . prazosin (MINIPRESS) capsule 1 mg  1 mg Oral QHS Ward, Kristen N, DO      . QUEtiapine (SEROQUEL) tablet 200 mg  200 mg Oral QHS Ward, Kristen N, DO      . thiamine tablet 100 mg  100 mg Oral Daily Ward, Kristen N, DO   100 mg at 12/05/20 1115  . traZODone (DESYREL) tablet 100 mg  100 mg  Oral QHS PRN Ward, Delice Bison, DO       Current Outpatient Medications  Medication Sig Dispense Refill  . traZODone (DESYREL) 100 MG tablet Take 1 tablet (100 mg total) by mouth at bedtime as needed for sleep. 30 tablet 1  . acamprosate (CAMPRAL) 333 MG tablet Take 2 tablets (666 mg total) by mouth 3 (three) times daily with meals. 180 tablet 1  . busPIRone (BUSPAR) 15 MG tablet Take 15 mg by mouth 2 (two) times daily.    . cloNIDine (CATAPRES) 0.1 MG tablet Take 0.1 mg by mouth 3 (three) times daily as needed.    . cyclobenzaprine (FLEXERIL) 10 MG tablet Take 1 tablet by mouth 3 (three) times daily.    Marland Kitchen FLUoxetine (PROZAC) 40 MG capsule Take 2 capsules (80 mg total) by mouth daily. 60 capsule 1  . gabapentin (NEURONTIN) 800 MG tablet Take 1 tablet (800 mg total) by mouth 3 (three) times daily.  90 tablet 1  . hydrOXYzine (ATARAX/VISTARIL) 25 MG tablet Take 1 tablet (25 mg total) by mouth every 8 (eight) hours as needed for itching. 90 tablet 1  . levothyroxine (SYNTHROID) 125 MCG tablet Take 1 tablet (125 mcg total) by mouth daily at 6 (six) AM. 30 tablet 1  . lisinopril (ZESTRIL) 5 MG tablet Take 1 tablet (5 mg total) by mouth daily. (Patient not taking: No sig reported) 30 tablet 1  . LORazepam (ATIVAN) 0.5 MG tablet Take 0.5 mg by mouth daily as needed.    . pantoprazole (PROTONIX) 40 MG tablet Take 1 tablet (40 mg total) by mouth daily at 12 noon. (Patient not taking: No sig reported) 30 tablet 1  . prazosin (MINIPRESS) 1 MG capsule Take 1 capsule (1 mg total) by mouth at bedtime. 30 capsule 1  . pregabalin (LYRICA) 100 MG capsule Take 100 mg by mouth 2 (two) times daily. (Patient not taking: No sig reported)    . QUEtiapine (SEROQUEL) 200 MG tablet Take 1 tablet (200 mg total) by mouth at bedtime. 30 tablet 1  . thiamine 100 MG tablet Take 1 tablet (100 mg total) by mouth daily. 30 tablet 1    Musculoskeletal: Strength & Muscle Tone: within normal limits Gait & Station: unsteady Patient leans: N/A            Psychiatric Specialty Exam:  Presentation  General Appearance: Casual  Eye Contact:Good  Speech:Normal Rate  Speech Volume:Normal  Handedness:Right   Mood and Affect  Mood:Depressed; Dysphoric; Anxious  Affect:Congruent; Tearful   Thought Process  Thought Processes:Coherent  Descriptions of Associations:Intact  Orientation:Full (Time, Place and Person)  Thought Content:Perseveration  History of Schizophrenia/Schizoaffective disorder:No  Duration of Psychotic Symptoms:Less than six months  Hallucinations:No data recorded Ideas of Reference:None  Suicidal Thoughts:No data recorded Homicidal Thoughts:No data recorded  Sensorium  Memory:Immediate Good; Remote Good; Recent Good  Judgment:Intact  Insight:Present   Executive  Functions  Concentration:Good  Attention Span:Good  [place] of Knowledge:Good  Language:Good   Psychomotor Activity  Psychomotor Activity:No data recorded  Assets  Assets:Communication Skills; Desire for Improvement; Financial Resources/Insurance; Housing; Resilience; Social Support   Sleep  Sleep:No data recorded  Physical Exam: Physical Exam Constitutional:      Appearance: Normal appearance.  HENT:     Head: Normocephalic and atraumatic.     Mouth/Throat:     Pharynx: Oropharynx is clear.  Eyes:     Pupils: Pupils are equal, round, and reactive to light.  Cardiovascular:     Rate and Rhythm:  Normal rate and regular rhythm.  Pulmonary:     Effort: Pulmonary effort is normal.     Breath sounds: Normal breath sounds.  Abdominal:     General: Abdomen is flat.     Palpations: Abdomen is soft.  Musculoskeletal:        General: Normal range of motion.  Skin:    General: Skin is warm and dry.  Neurological:     General: No focal deficit present.     Mental Status: He is alert. Mental status is at baseline.     Coordination: Coordination abnormal.  Psychiatric:        Attention and Perception: He is inattentive.        Mood and Affect: Mood is depressed. Affect is tearful.        Speech: Speech is tangential.        Behavior: Behavior is agitated. Behavior is not aggressive.        Thought Content: Thought content includes suicidal ideation. Thought content includes suicidal plan.        Cognition and Memory: Memory is impaired.        Judgment: Judgment is impulsive.    Review of Systems  Constitutional: Negative.   HENT: Negative.   Eyes: Negative.   Respiratory: Negative.   Cardiovascular: Negative.   Gastrointestinal: Negative.   Musculoskeletal: Negative.   Skin: Negative.   Neurological: Positive for tremors and weakness.  Psychiatric/Behavioral: Positive for depression, substance abuse and suicidal ideas. The patient is nervous/anxious  and has insomnia.    Blood pressure (!) 139/111, pulse 89, temperature 99 F (37.2 C), resp. rate 18, height 5\' 5"  (1.651 m), weight 73.9 kg, SpO2 95 %. Body mass index is 27.12 kg/m.  Treatment Plan Summary: Daily contact with patient to assess and evaluate symptoms and progress in treatment, Medication management and Plan Patient with active suicidal ideation and multiple risk factors of suicide high risk of self-injury.  Also still intoxicated and having alcohol withdrawal symptoms.  He has been restarted on his usual outpatient medicine.  Because of his very high alcohol level overnight he is not yet eligible for admission to the inpatient psychiatric ward.  I placed orders for him to get detox medicine and some pain medicine for his knees this morning.  He will be observed and admitted once he is medically stable.  We can refer him out to other facilities in the meantime.  Patient understands the plan and is agreeable.  Disposition: Recommend psychiatric Inpatient admission when medically cleared. Supportive therapy provided about ongoing stressors.  Alethia Berthold, MD 12/05/2020 3:25 PM

## 2020-12-05 NOTE — ED Notes (Signed)
Hourly rounding performed, patient currently awake in room. Patient has no complaints at this time. Q15 minute rounds and monitoring via [place] and Officer to continue. 

## 2020-12-05 NOTE — ED Notes (Signed)
Hourly rounding performed, patient currently awake in room and transported to [facility]. Patient has no complaints at this time. Q15 minute rounds and monitoring via Engineer, drilling to continue.

## 2020-12-05 NOTE — ED Notes (Signed)
Report received from [place], Conservation officer, nature. Patient alert and oriented, warm and dry, and in no acute distress. Patient denies  HI, AVH and pain. Patient made aware of Q15 minute rounds and Engineer, drilling presence for their safety. Patient instructed to come to this nurse with needs or concerns.

## 2020-12-06 DIAGNOSIS — Z79899 Other long term (current) drug therapy: Secondary | ICD-10-CM | POA: Diagnosis not present

## 2020-12-06 DIAGNOSIS — F10239 Alcohol dependence with withdrawal, unspecified: Secondary | ICD-10-CM | POA: Diagnosis not present

## 2020-12-06 DIAGNOSIS — F319 Bipolar disorder, unspecified: Secondary | ICD-10-CM | POA: Diagnosis not present

## 2020-12-06 DIAGNOSIS — F4310 Post-traumatic stress disorder, unspecified: Secondary | ICD-10-CM | POA: Diagnosis not present

## 2020-12-06 DIAGNOSIS — R45851 Suicidal ideations: Secondary | ICD-10-CM | POA: Diagnosis not present

## 2020-12-06 DIAGNOSIS — Z20822 Contact with and (suspected) exposure to covid-19: Secondary | ICD-10-CM | POA: Diagnosis not present

## 2020-12-06 DIAGNOSIS — F1010 Alcohol abuse, uncomplicated: Secondary | ICD-10-CM | POA: Diagnosis not present

## 2020-12-06 DIAGNOSIS — G8929 Other chronic pain: Secondary | ICD-10-CM | POA: Diagnosis not present

## 2020-12-06 DIAGNOSIS — F32A Depression, unspecified: Secondary | ICD-10-CM | POA: Diagnosis not present

## 2020-12-06 DIAGNOSIS — F515 Nightmare disorder: Secondary | ICD-10-CM | POA: Diagnosis not present

## 2020-12-06 DIAGNOSIS — I10 Essential (primary) hypertension: Secondary | ICD-10-CM | POA: Diagnosis not present

## 2020-12-06 DIAGNOSIS — E039 Hypothyroidism, unspecified: Secondary | ICD-10-CM | POA: Diagnosis not present

## 2020-12-06 DIAGNOSIS — E538 Deficiency of other specified B group vitamins: Secondary | ICD-10-CM | POA: Diagnosis not present

## 2020-12-06 DIAGNOSIS — F4312 Post-traumatic stress disorder, chronic: Secondary | ICD-10-CM | POA: Diagnosis not present

## 2020-12-06 NOTE — ED Notes (Signed)
This rn attempted to call report with no answer at this time. 

## 2020-12-06 NOTE — ED Notes (Signed)
Breakfast tray given at this time.  

## 2020-12-06 NOTE — BH Assessment (Signed)
Patient has been accepted to [facility].  Patient assigned to [facility] Accepting physician is Dr. Jonelle Sports.  Call report to [phone].  Representative was [place].   ER Staff is aware of it:  Vaughan Basta, ER Secretary  Dr. Beather Arbour, ER MD  Caryl Pina Patient's Nurse     Patient's can arrive anytime after 8 AM on 12/06/20.

## 2020-12-06 NOTE — ED Provider Notes (Signed)
-----------------------------------------   5:45 AM on 12/06/2020 -----------------------------------------  Patient accepted to [facility] to be transported later today.   Paulette Blanch, MD 12/06/20 [phone]

## 2020-12-26 DIAGNOSIS — K3189 Other diseases of stomach and duodenum: Secondary | ICD-10-CM | POA: Diagnosis not present

## 2020-12-26 DIAGNOSIS — Z789 Other specified health status: Secondary | ICD-10-CM | POA: Diagnosis not present

## 2020-12-26 DIAGNOSIS — K766 Portal hypertension: Secondary | ICD-10-CM | POA: Diagnosis not present

## 2020-12-26 DIAGNOSIS — Z1211 Encounter for screening for malignant neoplasm of colon: Secondary | ICD-10-CM | POA: Diagnosis not present

## 2020-12-26 DIAGNOSIS — K7030 Alcoholic cirrhosis of liver without ascites: Secondary | ICD-10-CM | POA: Diagnosis not present

## 2020-12-26 DIAGNOSIS — K5903 Drug induced constipation: Secondary | ICD-10-CM | POA: Diagnosis not present

## 2020-12-26 DIAGNOSIS — I8510 Secondary esophageal varices without bleeding: Secondary | ICD-10-CM | POA: Diagnosis not present

## 2020-12-26 DIAGNOSIS — T402X5A Adverse effect of other opioids, initial encounter: Secondary | ICD-10-CM | POA: Diagnosis not present

## 2020-12-26 DIAGNOSIS — F1020 Alcohol dependence, uncomplicated: Secondary | ICD-10-CM | POA: Diagnosis not present

## 2021-01-02 ENCOUNTER — Other Ambulatory Visit: Payer: Self-pay | Admitting: Gastroenterology

## 2021-01-02 DIAGNOSIS — K7030 Alcoholic cirrhosis of liver without ascites: Secondary | ICD-10-CM

## 2021-03-04 ENCOUNTER — Other Ambulatory Visit: Payer: Self-pay

## 2021-03-04 ENCOUNTER — Emergency Department: Payer: Medicare HMO

## 2021-03-04 ENCOUNTER — Emergency Department
Admission: EM | Admit: 2021-03-04 | Discharge: 2021-03-04 | Disposition: A | Discharge status: Home / Self Care | Payer: Medicare HMO | Attending: Emergency Medicine | Admitting: Emergency Medicine

## 2021-03-04 DIAGNOSIS — E039 Hypothyroidism, unspecified: Secondary | ICD-10-CM | POA: Insufficient documentation

## 2021-03-04 DIAGNOSIS — H1131 Conjunctival hemorrhage, right eye: Secondary | ICD-10-CM | POA: Diagnosis not present

## 2021-03-04 DIAGNOSIS — R519 Headache, unspecified: Secondary | ICD-10-CM | POA: Insufficient documentation

## 2021-03-04 DIAGNOSIS — F10120 Alcohol abuse with intoxication, uncomplicated: Secondary | ICD-10-CM | POA: Diagnosis not present

## 2021-03-04 DIAGNOSIS — R45851 Suicidal ideations: Secondary | ICD-10-CM | POA: Diagnosis not present

## 2021-03-04 DIAGNOSIS — I10 Essential (primary) hypertension: Secondary | ICD-10-CM | POA: Diagnosis not present

## 2021-03-04 DIAGNOSIS — R079 Chest pain, unspecified: Secondary | ICD-10-CM | POA: Diagnosis not present

## 2021-03-04 DIAGNOSIS — Z20822 Contact with and (suspected) exposure to covid-19: Secondary | ICD-10-CM | POA: Diagnosis not present

## 2021-03-04 DIAGNOSIS — M79605 Pain in left leg: Secondary | ICD-10-CM | POA: Insufficient documentation

## 2021-03-04 DIAGNOSIS — R0902 Hypoxemia: Secondary | ICD-10-CM | POA: Diagnosis not present

## 2021-03-04 DIAGNOSIS — M4328 Fusion of spine, sacral and sacrococcygeal region: Secondary | ICD-10-CM | POA: Diagnosis not present

## 2021-03-04 DIAGNOSIS — S0081XA Abrasion of other part of head, initial encounter: Secondary | ICD-10-CM | POA: Diagnosis not present

## 2021-03-04 DIAGNOSIS — I700 Atherosclerosis of aorta: Secondary | ICD-10-CM | POA: Diagnosis not present

## 2021-03-04 DIAGNOSIS — W19XXXA Unspecified fall, initial encounter: Secondary | ICD-10-CM

## 2021-03-04 DIAGNOSIS — S199XXA Unspecified injury of neck, initial encounter: Secondary | ICD-10-CM | POA: Diagnosis not present

## 2021-03-04 DIAGNOSIS — Z79899 Other long term (current) drug therapy: Secondary | ICD-10-CM | POA: Diagnosis not present

## 2021-03-04 DIAGNOSIS — M79604 Pain in right leg: Secondary | ICD-10-CM | POA: Diagnosis not present

## 2021-03-04 DIAGNOSIS — Y908 Blood alcohol level of 240 mg/100 ml or more: Secondary | ICD-10-CM | POA: Diagnosis not present

## 2021-03-04 DIAGNOSIS — F10220 Alcohol dependence with intoxication, uncomplicated: Secondary | ICD-10-CM | POA: Diagnosis not present

## 2021-03-04 DIAGNOSIS — S80812A Abrasion, left lower leg, initial encounter: Secondary | ICD-10-CM | POA: Diagnosis not present

## 2021-03-04 DIAGNOSIS — S0083XA Contusion of other part of head, initial encounter: Secondary | ICD-10-CM | POA: Diagnosis not present

## 2021-03-04 DIAGNOSIS — I2510 Atherosclerotic heart disease of native coronary artery without angina pectoris: Secondary | ICD-10-CM | POA: Diagnosis not present

## 2021-03-04 DIAGNOSIS — S3992XA Unspecified injury of lower back, initial encounter: Secondary | ICD-10-CM | POA: Diagnosis not present

## 2021-03-04 DIAGNOSIS — R410 Disorientation, unspecified: Secondary | ICD-10-CM | POA: Diagnosis not present

## 2021-03-04 DIAGNOSIS — I517 Cardiomegaly: Secondary | ICD-10-CM | POA: Diagnosis not present

## 2021-03-04 DIAGNOSIS — F10929 Alcohol use, unspecified with intoxication, unspecified: Secondary | ICD-10-CM | POA: Diagnosis not present

## 2021-03-04 DIAGNOSIS — F10920 Alcohol use, unspecified with intoxication, uncomplicated: Secondary | ICD-10-CM

## 2021-03-04 DIAGNOSIS — S0993XA Unspecified injury of face, initial encounter: Secondary | ICD-10-CM | POA: Diagnosis not present

## 2021-03-04 LAB — RESP PANEL BY RT-PCR (FLU A&B, COVID) ARPGX2
Influenza A by PCR: NEGATIVE
Influenza B by PCR: NEGATIVE
SARS Coronavirus 2 by RT PCR: NEGATIVE

## 2021-03-04 LAB — COMPREHENSIVE METABOLIC PANEL
ALT: 29 U/L (ref 0–44)
AST: 39 U/L (ref 15–41)
Albumin: 4.5 g/dL (ref 3.5–5.0)
Alkaline Phosphatase: 62 U/L (ref 38–126)
Anion gap: 12 (ref 5–15)
BUN: 7 mg/dL (ref 6–20)
CO2: 22 mmol/L (ref 22–32)
Calcium: 8.2 mg/dL — ABNORMAL LOW (ref 8.9–10.3)
Chloride: 107 mmol/L (ref 98–111)
Creatinine, Ser: 0.79 mg/dL (ref 0.61–1.24)
GFR, Estimated: >60 mL/min (ref >60)
Glucose, Bld: 106 mg/dL — ABNORMAL HIGH (ref 70–99)
Potassium: 3.7 mmol/L (ref 3.5–5.1)
Sodium: 141 mmol/L (ref 135–145)
Total Bilirubin: 1.1 mg/dL (ref 0.3–1.2)
Total Protein: 8.4 g/dL — ABNORMAL HIGH (ref 6.5–8.1)

## 2021-03-04 LAB — LIPASE, BLOOD: Lipase: 31 U/L (ref 11–51)

## 2021-03-04 LAB — ACETAMINOPHEN LEVEL: Acetaminophen (Tylenol), Serum: <10 ug/mL — ABNORMAL LOW (ref 10–30)

## 2021-03-04 LAB — CBC
HCT: 45.2 % (ref 39.0–52.0)
Hemoglobin: 15.6 g/dL (ref 13.0–17.0)
MCH: 31.9 pg (ref 26.0–34.0)
MCHC: 34.5 g/dL (ref 30.0–36.0)
MCV: 92.4 fL (ref 80.0–100.0)
Platelets: 288 10*3/uL (ref 150–400)
RBC: 4.89 MIL/uL (ref 4.22–5.81)
RDW: 15.4 % (ref 11.5–15.5)
WBC: 7.3 10*3/uL (ref 4.0–10.5)
nRBC: 0 % (ref 0.0–0.2)

## 2021-03-04 LAB — SALICYLATE LEVEL: Salicylate Lvl: <7 mg/dL — ABNORMAL LOW (ref 7.0–30.0)

## 2021-03-04 LAB — ETHANOL: Alcohol, Ethyl (B): 435 mg/dL (ref <10)

## 2021-03-04 NOTE — ED Notes (Signed)
Ed m,d made aware of elevated etoh
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
## 2021-03-04 NOTE — ED Provider Notes (Addendum)
[facility]  ____________________________________________   Event Date/Time   First MD Initiated Contact with Patient 03/04/21 1215     (approximate)  I have reviewed the triage vital signs and the nursing notes.   HISTORY  Chief Complaint Fall    HPI Ivan Hamilton is a 55 y.o. male past medical history of alcohol use disorder, depression with psychotic features who presents after a fall. Pt tells me he was on a ladder painting when he felt like he wanted to end his life so he jumped. He cannot tell me what part of his body he fell on or if he lost consciousness. He endorses pain on his face and BL legs at this time. Tells me he was drinking ETOH earlier, and he drinks daily. He also endorses CP but denies abdominal pain. Denies intentioanl OD.          Past Medical History:  Diagnosis Date   Alcohol withdrawal ([place])    B12 deficiency    Chronic pain    CVA (cerebral vascular accident) ([place])    Hypertension    Hypothyroidism    Varicose veins     Patient Active Problem List   Diagnosis Date Noted   Severe major depression without psychotic features ([place]) 11/07/2020   Generalized anxiety disorder 11/07/2020   RUQ abdominal pain 05/17/2020   Lip swelling    Hypotension [id number]   Metabolic acidosis    Hepatic cirrhosis ([place])    Diverticulitis    Transaminitis    Syncope 07/11/2019   Abdominal pain 07/11/2019   Alcohol abuse 07/11/2019   Thrombocytopenia ([place]) 07/11/2019   Normocytic anemia 07/11/2019   Tobacco abuse 07/11/2019   HTN (hypertension) 07/11/2019   CVA (cerebral vascular accident) ([place])    Major psychotic depression, recurrent ([place]) 06/01/2019   Hyperbilirubinemia 12/23/2018   Suicidal behavior 08/28/2018   Severe recurrent major depressive disorder with psychotic features ([place]) 08/28/2018   Alcohol intoxication ([place]) 08/28/2018   S/P laparoscopic cholecystectomy 11/02/2017   Duodenitis    Gastritis without bleeding     Hypokalemia 10/22/2017   Intractable vomiting with nausea    Anxiety and depression 04/10/2016   Alcohol withdrawal delirium ([place])    Alcohol withdrawal ([place]) 09/02/2015   Alcohol use disorder, severe, dependence ([place]) 09/02/2015   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[place]) 09/02/2015   Chronic post-traumatic stress disorder (PTSD) 09/01/2015   Hypothyroid 09/01/2015   Gastric reflux 09/01/2015   DDD (degenerative disc disease), lumbosacral 09/01/2015   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[place]) 08/17/2015   Vitamin B12 deficiency 08/17/2015   Pain of lumbar spine 05/19/2015   Neuropathy 05/17/2015   Varicose vein of leg 04/25/2015   Abnormal LFTs (liver function tests) 03/31/2015   Confusion state 03/31/2015   Chronic pain of left lower extremity 03/31/2015    Past Surgical History:  Procedure Laterality Date   CHOLECYSTECTOMY     ESOPHAGOGASTRODUODENOSCOPY (EGD) WITH PROPOFOL N/A 10/24/2017   Procedure: ESOPHAGOGASTRODUODENOSCOPY (EGD) WITH PROPOFOL;  Surgeon: [name], MD;  Location: [facility] ENDOSCOPY;  Service: Endoscopy;  Laterality: N/A;   ESOPHAGOGASTRODUODENOSCOPY (EGD) WITH PROPOFOL N/A 07/12/2019   Procedure: ESOPHAGOGASTRODUODENOSCOPY (EGD) WITH PROPOFOL;  Surgeon: Jonathon Bellows, MD;  Location: [facility] ENDOSCOPY;  Service: Gastroenterology;  Laterality: N/A;   LEG SURGERY      Prior to Admission medications   Medication Sig Start Date End Date Taking? Authorizing Provider  acamprosate (CAMPRAL) 333 MG tablet Take 2 tablets (666 mg total) by mouth 3 (  three) times daily with meals. 11/11/20   Salley Scarlet, MD  busPIRone (BUSPAR) 15 MG tablet Take 15 mg by mouth 2 (two) times daily. 12/02/20   [provider]  cloNIDine (CATAPRES) 0.1 MG tablet Take 0.1 mg by mouth 3 (three) times daily as needed. 11/14/20   [provider]  cyclobenzaprine (FLEXERIL) 10 MG tablet Take 1 tablet by mouth 3 (three) times  daily. 12/01/20   [provider]  FLUoxetine (PROZAC) 40 MG capsule Take 2 capsules (80 mg total) by mouth daily. 11/11/20   Salley Scarlet, MD  gabapentin (NEURONTIN) 800 MG tablet Take 1 tablet (800 mg total) by mouth 3 (three) times daily. 11/11/20   Salley Scarlet, MD  hydrOXYzine (ATARAX/VISTARIL) 25 MG tablet Take 1 tablet (25 mg total) by mouth every 8 (eight) hours as needed for itching. 11/11/20   Salley Scarlet, MD  levothyroxine (SYNTHROID) 125 MCG tablet Take 1 tablet (125 mcg total) by mouth daily at 6 (six) AM. 11/11/20   Salley Scarlet, MD  lisinopril (ZESTRIL) 5 MG tablet Take 1 tablet (5 mg total) by mouth daily. Patient not taking: No sig reported 11/12/20   Salley Scarlet, MD  LORazepam (ATIVAN) 0.5 MG tablet Take 0.5 mg by mouth daily as needed. 12/01/20   [provider]  pantoprazole (PROTONIX) 40 MG tablet Take 1 tablet (40 mg total) by mouth daily at 12 noon. Patient not taking: No sig reported 11/11/20   Salley Scarlet, MD  prazosin (MINIPRESS) 1 MG capsule Take 1 capsule (1 mg total) by mouth at bedtime. 11/11/20   Salley Scarlet, MD  pregabalin (LYRICA) 100 MG capsule Take 100 mg by mouth 2 (two) times daily. Patient not taking: No sig reported 11/14/20   [provider]  QUEtiapine (SEROQUEL) 200 MG tablet Take 1 tablet (200 mg total) by mouth at bedtime. 11/11/20   Salley Scarlet, MD  thiamine 100 MG tablet Take 1 tablet (100 mg total) by mouth daily. 11/12/20   Salley Scarlet, MD  traZODone (DESYREL) 100 MG tablet Take 1 tablet (100 mg total) by mouth at bedtime as needed for sleep. 11/11/20   Salley Scarlet, MD    Allergies Patient has no known allergies.  No family history on file.  Social History Social History   Tobacco Use   Smoking status: Never   Smokeless tobacco: Never  Vaping Use   Vaping Use: Never used  Substance Use Topics   Alcohol use: Yes    Alcohol/week: 21.0 standard drinks    Types: 21 Shots of  liquor per week    Comment: daily- 1 pint of vodka   Drug use: No    Review of Systems   Review of Systems  Constitutional:  Negative for chills and fever.  Respiratory:  Negative for shortness of breath.   Cardiovascular:  Positive for chest pain. Negative for leg swelling.  Gastrointestinal:  Negative for abdominal pain, nausea and vomiting.  Musculoskeletal:  Positive for arthralgias and myalgias.  Psychiatric/Behavioral:  Positive for dysphoric mood and suicidal ideas.   All other systems reviewed and are negative.  Physical Exam Updated Vital Signs BP 115/71 (BP Location: Right Arm)   Pulse 75   Temp 98.7 F (37.1 C) (Oral)   Resp 17   SpO2 98%   Physical Exam Vitals and nursing note reviewed.  Constitutional:      General: He is not in acute distress.    Appearance: Normal  appearance.  HENT:     Head: Normocephalic.     Comments: Abrasion on the right cheek with associated swelling Eyes:     General: No scleral icterus.    Conjunctiva/sclera: Conjunctivae normal.     Comments: R subconjunctival hemorrhage, PERRLA, EOMI  Cardiovascular:     Rate and Rhythm: Normal rate.  Pulmonary:     Effort: Pulmonary effort is normal. No respiratory distress.     Breath sounds: Normal breath sounds. No wheezing.  Abdominal:     Tenderness: There is no abdominal tenderness. There is no guarding.  Musculoskeletal:        General: No deformity or signs of injury.     Cervical back: Normal range of motion. No tenderness.     Comments: Pelvis is stable, non-tender  +T spine tenderness, no C or L spine tenderness  Mild ttp over the R chest wall, no crepitus  No abdominal tenderness  No extremity deformity or tenderness; Abrasion over the R knee   Skin:    Coloration: Skin is not jaundiced or pale.  Neurological:     Mental Status: He is alert.     Comments: Appears intoxicated, inappropriate  Alert  Moves all extremities symmetrically   Psychiatric:     Comments: Suicidal  ideation     LABS (all labs ordered are listed, but only abnormal results are displayed)  Labs Reviewed  COMPREHENSIVE METABOLIC PANEL - Abnormal; Notable for the following components:      Result Value   Glucose, Bld 106 (*)    Calcium 8.2 (*)    Total Protein 8.4 (*)    All other components within normal limits  ETHANOL - Abnormal; Notable for the following components:   Alcohol, Ethyl (B) 435 (*)    All other components within normal limits  ACETAMINOPHEN LEVEL - Abnormal; Notable for the following components:   Acetaminophen (Tylenol), Serum <10 (*)    All other components within normal limits  SALICYLATE LEVEL - Abnormal; Notable for the following components:   Salicylate Lvl [id number] (*)    All other components within normal limits  RESP PANEL BY RT-PCR (FLU A&B, COVID) ARPGX2  LIPASE, BLOOD  CBC  URINALYSIS, COMPLETE (UACMP) WITH MICROSCOPIC  URINE DRUG SCREEN, QUALITATIVE ([facility] ONLY)   ____________________________________________  EKG  N/a ____________________________________________  RADIOLOGY Almeta Monas, personally viewed and evaluated these images (plain radiographs) as part of my medical decision making, as well as reviewing the written report by the radiologist.  ED MD interpretation: I reviewed the chest x-ray which does not show any acute infiltrate or pneumothorax I reviewed the tib/fib xray which does not show any abnormality     ____________________________________________   PROCEDURES  Procedure(s) performed (including Critical Care):  Procedures   ____________________________________________   INITIAL IMPRESSION / ASSESSMENT AND PLAN / ED COURSE   Patient is a 55 year old male past medical history of depression and alcohol use disorder who presents after a fall from a ladder that was an apparent suicide attempt.  On exam he is clearly intoxicated but does tell me that he does not want to live.  Unclear exactly how tall the ladder  was or how he landed but he cannot tell me that.  CT head C-spine and max face were obtained which did not show any acute abnormality.  Chest x-ray x-ray of the left tib-fib and CT T and L-spine also obtained which are all negative.  Ethanol level was obtained in triage which is notably 435.  Rest of his labs are reassuring.  IVC order placed given his suicidal ideation with this apparent attempt.  Will observe in the ED while he metabolizes and will likely need a tertiary exam once he is more sober to ensure that we have not missed any traumatic injury.   On reevaluation the patient is much more awake and appropriate.  He tells me that his fell off his ladder and he denies any suicidal intent.  He notes that he has a primary care physician who has prescribed him medication for depression in the past.  He adamantly denies suicidal ideation.  Denies any complaints at this time.  I observed the patient walk with a steady gait and he tolerated p.o.  Will DC the IVC as now that he is more sober I do not truly believe that he was suicidal and is more likely that while he was painting he fell off the ladder.      ____________________________________________   FINAL CLINICAL IMPRESSION(S) / ED DIAGNOSES  Final diagnoses:  Suicidal ideation  Alcoholic intoxication without complication [facility])     ED Discharge Orders     None        Note:  This document was prepared using Dragon voice recognition software and may include unintentional dictation errors.    Rada Hay, MD 03/04/21 1523    Rada Hay, MD 03/04/21 1538

## 2021-03-04 NOTE — ED Notes (Signed)
Pt became aggressive when told he would be discharged, pt wants a ride and demanding [place] ed give him a ride, called pts family and they refused to pick him up . Security called and escorted pt to front door , [place] pd called

## 2021-03-04 NOTE — ED Notes (Signed)
Pt refused dc paperwork and refused to sign dc paperwork , pt yelling at all staff

## 2021-03-04 NOTE — ED Notes (Signed)
Ed rn cleaned abrasions to pts forehead

## 2021-03-09 DIAGNOSIS — M5442 Lumbago with sciatica, left side: Secondary | ICD-10-CM | POA: Diagnosis not present

## 2021-03-09 DIAGNOSIS — F10151 Alcohol abuse with alcohol-induced psychotic disorder with hallucinations: Secondary | ICD-10-CM | POA: Diagnosis not present

## 2021-03-09 DIAGNOSIS — F1094 Alcohol use, unspecified with alcohol-induced mood disorder: Secondary | ICD-10-CM | POA: Diagnosis not present

## 2021-03-09 DIAGNOSIS — Z9181 History of falling: Secondary | ICD-10-CM | POA: Diagnosis not present

## 2021-03-09 DIAGNOSIS — L239 Allergic contact dermatitis, unspecified cause: Secondary | ICD-10-CM | POA: Diagnosis not present

## 2021-03-09 DIAGNOSIS — E039 Hypothyroidism, unspecified: Secondary | ICD-10-CM | POA: Diagnosis not present

## 2021-03-22 ENCOUNTER — Ambulatory Visit: Payer: Medicare HMO | Admitting: Anesthesiology

## 2021-03-22 ENCOUNTER — Encounter: Payer: Self-pay | Admitting: Internal Medicine

## 2021-03-22 ENCOUNTER — Encounter
Admission: RE | Disposition: A | Discharge status: Home / Self Care | Payer: Self-pay | Source: Home / Self Care | Attending: Internal Medicine

## 2021-03-22 ENCOUNTER — Ambulatory Visit
Admission: RE | Admit: 2021-03-22 | Discharge: 2021-03-22 | Disposition: A | Discharge status: Home / Self Care | Payer: Medicare HMO | Attending: Internal Medicine | Admitting: Internal Medicine

## 2021-03-22 DIAGNOSIS — Z79899 Other long term (current) drug therapy: Secondary | ICD-10-CM | POA: Insufficient documentation

## 2021-03-22 DIAGNOSIS — K5730 Diverticulosis of large intestine without perforation or abscess without bleeding: Secondary | ICD-10-CM | POA: Insufficient documentation

## 2021-03-22 DIAGNOSIS — K766 Portal hypertension: Secondary | ICD-10-CM | POA: Insufficient documentation

## 2021-03-22 DIAGNOSIS — I10 Essential (primary) hypertension: Secondary | ICD-10-CM | POA: Diagnosis not present

## 2021-03-22 DIAGNOSIS — K7460 Unspecified cirrhosis of liver: Secondary | ICD-10-CM | POA: Diagnosis not present

## 2021-03-22 DIAGNOSIS — K7030 Alcoholic cirrhosis of liver without ascites: Secondary | ICD-10-CM | POA: Diagnosis not present

## 2021-03-22 DIAGNOSIS — K640 First degree hemorrhoids: Secondary | ICD-10-CM | POA: Diagnosis not present

## 2021-03-22 DIAGNOSIS — Z79890 Hormone replacement therapy (postmenopausal): Secondary | ICD-10-CM | POA: Insufficient documentation

## 2021-03-22 DIAGNOSIS — I8500 Esophageal varices without bleeding: Secondary | ICD-10-CM | POA: Diagnosis not present

## 2021-03-22 DIAGNOSIS — Z1211 Encounter for screening for malignant neoplasm of colon: Secondary | ICD-10-CM | POA: Diagnosis not present

## 2021-03-22 DIAGNOSIS — K449 Diaphragmatic hernia without obstruction or gangrene: Secondary | ICD-10-CM | POA: Insufficient documentation

## 2021-03-22 DIAGNOSIS — Z8673 Personal history of transient ischemic attack (TIA), and cerebral infarction without residual deficits: Secondary | ICD-10-CM | POA: Insufficient documentation

## 2021-03-22 DIAGNOSIS — D123 Benign neoplasm of transverse colon: Secondary | ICD-10-CM | POA: Diagnosis not present

## 2021-03-22 DIAGNOSIS — K649 Unspecified hemorrhoids: Secondary | ICD-10-CM | POA: Diagnosis not present

## 2021-03-22 DIAGNOSIS — K635 Polyp of colon: Secondary | ICD-10-CM | POA: Diagnosis not present

## 2021-03-22 DIAGNOSIS — K3189 Other diseases of stomach and duodenum: Secondary | ICD-10-CM | POA: Diagnosis not present

## 2021-03-22 DIAGNOSIS — E039 Hypothyroidism, unspecified: Secondary | ICD-10-CM | POA: Diagnosis not present

## 2021-03-22 HISTORY — PX: ESOPHAGOGASTRODUODENOSCOPY: SHX5428

## 2021-03-22 HISTORY — PX: COLONOSCOPY WITH PROPOFOL: SHX5780

## 2021-03-22 SURGERY — COLONOSCOPY WITH PROPOFOL
Anesthesia: General

## 2021-03-22 MED ORDER — LIDOCAINE HCL (PF) 2 % IJ SOLN
INTRAMUSCULAR | Status: DC | PRN
  Administered 2021-03-22: 100 mg via INTRADERMAL

## 2021-03-22 MED ORDER — PROPOFOL 10 MG/ML IV BOLUS
INTRAVENOUS | Status: DC | PRN
  Administered 2021-03-22: 30 mg via INTRAVENOUS
  Administered 2021-03-22: 70 mg via INTRAVENOUS

## 2021-03-22 MED ORDER — SODIUM CHLORIDE 0.9 % IV SOLN
INTRAVENOUS | Status: DC
  Administered 2021-03-22: 20 mL/h via INTRAVENOUS

## 2021-03-22 MED ORDER — PROPOFOL 500 MG/50ML IV EMUL
INTRAVENOUS | Status: DC | PRN
  Administered 2021-03-22: 200 ug/kg/min via INTRAVENOUS

## 2021-03-22 NOTE — Anesthesia Postprocedure Evaluation (Signed)
Anesthesia Post Note  Patient: Ivan Hamilton  Procedure(s) Performed: COLONOSCOPY WITH PROPOFOL ESOPHAGOGASTRODUODENOSCOPY (EGD)  Patient location during evaluation: Endoscopy Anesthesia Type: General Level of consciousness: awake and alert Pain management: pain level controlled Vital Signs Assessment: post-procedure vital signs reviewed and stable Respiratory status: spontaneous breathing, nonlabored ventilation, respiratory function stable and patient connected to nasal cannula oxygen Cardiovascular status: blood pressure returned to baseline and stable Postop Assessment: no apparent nausea or vomiting Anesthetic complications: no   No notable events documented.   Last Vitals:  Vitals:   03/22/21 0917 03/22/21 1049  BP: (!) 146/85 105/81  Pulse: 62 68  Resp: 20 18  Temp: (!) 35.6 C   SpO2: 99% 99%    Last Pain:  Vitals:   03/22/21 1109  TempSrc:   PainSc: 0-No pain                 Precious Haws Satomi Buda

## 2021-03-22 NOTE — Op Note (Signed)
[facility] Gastroenterology Patient Name: Ivan Hamilton Procedure Date: 03/22/2021 10:04 AM MRN: [id number] Account #: [id number] Date of Birth: 12/21/1965 Admit Type: Outpatient Age: 55 Room: [facility] ENDO ROOM 2 Gender: Male Note Status: Finalized Procedure:             Colonoscopy Indications:           Screening for colorectal malignant neoplasm Providers:             Benay Pike. Roselind Klus MD, MD Medicines:             Propofol per Anesthesia Complications:         No immediate complications. Procedure:             Pre-Anesthesia Assessment:                        - The risks and benefits of the procedure and the                         sedation options and risks were discussed with the                         patient. All questions were answered and informed                         consent was obtained.                        - Patient identification and proposed procedure were                         verified prior to the procedure by the nurse. The                         procedure was verified in the procedure room.                        - ASA Grade Assessment: III - A patient with severe                         systemic disease.                        - After reviewing the risks and benefits, the patient                         was deemed in satisfactory condition to undergo the                         procedure.                        After obtaining informed consent, the colonoscope was                         passed under direct vision. Throughout the procedure,                         the patient's blood pressure, pulse, and oxygen  saturations were monitored continuously. The                         Colonoscope was introduced through the anus and                         advanced to the the cecum, identified by appendiceal                         orifice and ileocecal valve. The colonoscopy was                         performed  without difficulty. The patient tolerated                         the procedure well. The quality of the bowel                         preparation was fair. The ileocecal valve, appendiceal                         orifice, and rectum were photographed. Findings:      The perianal and digital rectal examinations were normal. Pertinent       negatives include normal sphincter tone and no palpable rectal lesions.      Non-bleeding internal hemorrhoids were found during retroflexion. The       hemorrhoids were Grade I (internal hemorrhoids that do not prolapse).      Many small and large-mouthed diverticula were found in the sigmoid colon.      A 4 mm polyp was found in the transverse colon. The polyp was sessile.       The polyp was removed with a cold biopsy forceps. Resection and       retrieval were complete.      Semi-solid stool was found in the entire colon, making visualization       difficult. Lavage of the area was performed using a large amount of       sterile water, resulting in clearance with fair visualization. Estimated       blood loss: none.      The exam was otherwise without abnormality. Impression:            - Preparation of the colon was fair.                        - Non-bleeding internal hemorrhoids.                        - Diverticulosis in the sigmoid colon.                        - One 4 mm polyp in the transverse colon, removed with                         a cold biopsy forceps. Resected and retrieved.                        - Stool in the entire examined colon.                        -  The examination was otherwise normal. Recommendation:        - Await pathology results from EGD, also performed                         today.                        - Repeat EGD in one year for variceal surveillance.                        - Patient has a contact number available for                         emergencies. The signs and symptoms of potential                          delayed complications were discussed with the patient.                         Return to normal activities tomorrow. Written                         discharge instructions were provided to the patient.                        - Resume previous diet.                        - Continue present medications.                        - Await pathology results.                        - Repeat colonoscopy in 3 years for surveillance.                        - Return to GI office in 6 months.                        - The findings and recommendations were discussed with                         the patient. Procedure Code(s):     --- Professional ---                        [phone], Colonoscopy, flexible; with biopsy, single or                         multiple Diagnosis Code(s):     --- Professional ---                        K57.30, Diverticulosis of large intestine without                         perforation or abscess without bleeding                        K63.5, Polyp of colon  K64.0, First degree hemorrhoids                        Z12.11, Encounter for screening for malignant neoplasm                         of colon CPT copyright 2019 American Medical Association. All rights reserved. The codes documented in this report are preliminary and upon coder review may  be revised to meet current compliance requirements. Efrain Sella MD, MD 03/22/2021 10:52:42 AM This report has been signed electronically. Number of Addenda: 0 Note Initiated On: 03/22/2021 10:04 AM Scope Withdrawal Time: 0 hours 8 minutes 18 seconds  Total Procedure Duration: 0 hours 12 minutes 12 seconds  Estimated Blood Loss:  Estimated blood loss: none.      [facility]

## 2021-03-22 NOTE — Anesthesia Preprocedure Evaluation (Signed)
Anesthesia Evaluation  Patient identified by MRN, date of birth, ID band Patient awake    Reviewed: Allergy & Precautions, H&P , NPO status , Patient's Chart, lab work & pertinent test results  Airway Mallampati: III  TM Distance: >3 FB Neck ROM: limited    Dental  (+) Chipped, Poor Dentition, Missing   Pulmonary neg pulmonary ROS, neg shortness of breath,           Cardiovascular hypertension,      Neuro/Psych PSYCHIATRIC DISORDERS Anxiety Depression CVA    GI/Hepatic negative GI ROS, (+) Cirrhosis     substance abuse  alcohol use,   Endo/Other  Hypothyroidism   Renal/GU negative Renal ROS  negative genitourinary   Musculoskeletal  (+) Arthritis ,   Abdominal   Peds  Hematology   Anesthesia Other Findings Past Medical History: No date: Alcohol withdrawal (HCC) No date: B12 deficiency No date: Chronic pain No date: CVA (cerebral vascular accident) ([place]) No date: Hypertension No date: Hypothyroidism No date: Varicose veins   Reproductive/Obstetrics negative OB ROS                             Anesthesia Physical  Anesthesia Plan  ASA: III  Anesthesia Plan: General   Post-op Pain Management:    Induction: Intravenous  PONV Risk Score and Plan: Propofol infusion and TIVA  Airway Management Planned: Natural Airway and Nasal Cannula  Additional Equipment:   Intra-op Plan:   Post-operative Plan:   Informed Consent: I have reviewed the patients History and Physical, chart, labs and discussed the procedure including the risks, benefits and alternatives for the proposed anesthesia with the patient or authorized representative who has indicated his/her understanding and acceptance.     Dental Advisory Given and Interpreter used for interveiw  Plan Discussed with: Anesthesiologist  Anesthesia Plan Comments: (Patient consented for risks of anesthesia including but not limited  to:  - adverse reactions to medications - risk of airway placement if required - damage to eyes, teeth, lips or other oral mucosa - nerve damage due to positioning  - sore throat or hoarseness - Damage to heart, brain, nerves, lungs, other parts of body or loss of life  Patient voiced understanding.)        Anesthesia Quick Evaluation

## 2021-03-22 NOTE — Op Note (Signed)
[facility] Gastroenterology Patient Name: Ivan Hamilton Procedure Date: 03/22/2021 10:04 AM MRN: [id number] Account #: [id number] Date of Birth: [birth year] Admit Type: Outpatient Age: 55 Room: [facility] ENDO ROOM 2 Gender: Male Note Status: Finalized Procedure:             Upper GI endoscopy Indications:           CIrrhosis of the liver, Portal venous hypertension Providers:             Benay Pike. Neveyah Garzon MD, MD Medicines:             Propofol per Anesthesia Complications:         No immediate complications. Procedure:             Pre-Anesthesia Assessment:                        - The risks and benefits of the procedure and the                         sedation options and risks were discussed with the                         patient. All questions were answered and informed                         consent was obtained.                        - Patient identification and proposed procedure were                         verified prior to the procedure by the nurse. The                         procedure was verified in the procedure room.                        - ASA Grade Assessment: III - A patient with severe                         systemic disease.                        - After reviewing the risks and benefits, the patient                         was deemed in satisfactory condition to undergo the                         procedure.                        After obtaining informed consent, the endoscope was                         passed under direct vision. Throughout the procedure,                         the patient's blood pressure, pulse, and oxygen  saturations were monitored continuously. The                         Endosonoscope was introduced through the mouth, and                         advanced to the third part of duodenum. The upper GI                         endoscopy was accomplished without difficulty. The                          patient tolerated the procedure well. Findings:      One column of grade I varices with no stigmata of recent bleeding were       found in the lower third of the esophagus,. They were 5 mm in largest       diameter. No red wale signs were present. Estimated blood loss: none.      Mild portal hypertensive gastropathy was found in the entire examined       stomach. Two biopsies were obtained in the gastric body with cold       forceps for histology.      A 2 cm hiatal hernia was present.      There is no endoscopic evidence of varices in the cardia.      The examined duodenum was normal.      The exam was otherwise without abnormality. Impression:            - Grade I esophageal varices with no stigmata of                         recent bleeding.                        - Portal hypertensive gastropathy.                        - 2 cm hiatal hernia.                        - Normal examined duodenum.                        - The examination was otherwise normal.                        - Two biopsies were obtained in the gastric body. Recommendation:        - Await pathology results.                        - Repeat upper endoscopy in 1 year for surveillance.                        - Proceed with colonoscopy Procedure Code(s):     --- Professional ---                        [phone], Esophagogastroduodenoscopy, flexible,                         transoral; with biopsy, single or  multiple Diagnosis Code(s):     --- Professional ---                        K44.9, Diaphragmatic hernia without obstruction or                         gangrene                        K31.89, Other diseases of stomach and duodenum                        K76.6, Portal hypertension                        I85.00, Esophageal varices without bleeding CPT copyright 2019 [employer]. All rights reserved. The codes documented in this report are preliminary and upon coder review may  be revised to meet  current compliance requirements. Efrain Sella MD, MD 03/22/2021 10:29:32 AM This report has been signed electronically. Number of Addenda: 0 Note Initiated On: 03/22/2021 10:04 AM Estimated Blood Loss:  Estimated blood loss: none.      [facility]

## 2021-03-22 NOTE — H&P (Signed)
Outpatient short stay form Pre-procedure 03/22/2021 10:11 AM Ivan Hamilton K. Alice Reichert, M.D.  Primary Physician: Tracie Harrier, M.D.  Reason for visit:  Portal hypertension, cirrhosis, colon cancer screening  History of present illness:  Patient presents for colonoscopy for colon cancer screening. The patient denies complaints of abdominal pain, significant change in bowel habits, or rectal bleeding.  55 y/o patient presents for EGD for variceal screening due to portal hypertension and history of cirrhosis secondary to alcohol. Patient denies intractable heartburn, dysphagia, hemetemesis, abdominal pain, nausea or vomiting.      Current Facility-Administered Medications:    0.9 %  sodium chloride infusion, , Intravenous, Continuous, [place], Benay Pike, MD, Last Rate: 20 mL/hr at 03/22/21 0931, 20 mL/hr at 03/22/21 0931  Medications Prior to Admission  Medication Sig Dispense Refill Last Dose   acamprosate (CAMPRAL) 333 MG tablet Take 2 tablets (666 mg total) by mouth 3 (three) times daily with meals. 180 tablet 1 Past Week   busPIRone (BUSPAR) 15 MG tablet Take 15 mg by mouth 2 (two) times daily.   Past Week   cloNIDine (CATAPRES) 0.1 MG tablet Take 0.1 mg by mouth 3 (three) times daily as needed.   Past Week   cyclobenzaprine (FLEXERIL) 10 MG tablet Take 1 tablet by mouth 3 (three) times daily.   Past Week   FLUoxetine (PROZAC) 40 MG capsule Take 2 capsules (80 mg total) by mouth daily. 60 capsule 1 Past Week   folic acid (FOLVITE) 1 MG tablet Take 1 mg by mouth daily.   Past Week   gabapentin (NEURONTIN) 800 MG tablet Take 1 tablet (800 mg total) by mouth 3 (three) times daily. 90 tablet 1 03/21/2021   HYDROcodone Bitartrate ER 30 MG CP12 Take 30 mg by mouth.   Past Week   hydrOXYzine (ATARAX/VISTARIL) 25 MG tablet Take 1 tablet (25 mg total) by mouth every 8 (eight) hours as needed for itching. 90 tablet 1 Past Week   lactulose (CHRONULAC) 10 GM/15ML solution Take 30 g by mouth 2 (two) times  daily.   Past Week   lisinopril (ZESTRIL) 5 MG tablet Take 1 tablet (5 mg total) by mouth daily. 30 tablet 1 Past Week   LORazepam (ATIVAN) 0.5 MG tablet Take 0.5 mg by mouth daily as needed.   Past Week   pantoprazole (PROTONIX) 40 MG tablet Take 1 tablet (40 mg total) by mouth daily at 12 noon. 30 tablet 1 Past Week   prazosin (MINIPRESS) 1 MG capsule Take 1 capsule (1 mg total) by mouth at bedtime. 30 capsule 1 Past Week   QUEtiapine (SEROQUEL) 200 MG tablet Take 1 tablet (200 mg total) by mouth at bedtime. 30 tablet 1 Past Week   thiamine 100 MG tablet Take 1 tablet (100 mg total) by mouth daily. 30 tablet 1 Past Week   traZODone (DESYREL) 100 MG tablet Take 1 tablet (100 mg total) by mouth at bedtime as needed for sleep. 30 tablet 1 Past Week   levothyroxine (SYNTHROID) 125 MCG tablet Take 1 tablet (125 mcg total) by mouth daily at 6 (six) AM. 30 tablet 1    pregabalin (LYRICA) 100 MG capsule Take 100 mg by mouth 2 (two) times daily. (Patient not taking: No sig reported)        No Known Allergies   Past Medical History:  Diagnosis Date   Alcohol withdrawal ([place])    B12 deficiency    Chronic pain    CVA (cerebral vascular accident) ([place])    Hypertension  Hypothyroidism    Varicose veins     Review of systems:  Otherwise negative.    Physical Exam  Gen: Alert, oriented. Appears stated age.  HEENT: [place]/AT. PERRLA. Lungs: CTA, no wheezes. CV: RR nl S1, S2. Abd: soft, benign, no masses. BS+ Ext: No edema. Pulses 2+    Planned procedures: Proceed with EGD and colonoscopy. The patient understands the nature of the planned procedure, indications, risks, alternatives and potential complications including but not limited to bleeding, infection, perforation, damage to internal organs and possible oversedation/side effects from anesthesia. The patient agrees and gives consent to proceed.  Please refer to procedure notes for findings, recommendations and patient  disposition/instructions.     [place] K. Alice Reichert, M.D. Gastroenterology 03/22/2021  10:11 AM

## 2021-03-22 NOTE — Transfer of Care (Signed)
Immediate Anesthesia Transfer of Care Note  Patient: Ivan Hamilton  Procedure(s) Performed: COLONOSCOPY WITH PROPOFOL ESOPHAGOGASTRODUODENOSCOPY (EGD)  Patient Location: PACU  Anesthesia Type:General  Level of Consciousness: sedated  Airway & Oxygen Therapy: Patient Spontanous Breathing and Patient connected to nasal cannula oxygen  Post-op Assessment: Report given to RN and Post -op Vital signs reviewed and stable  Post vital signs: Reviewed and stable  Last Vitals:  Vitals Value Taken Time  BP 105/81 03/22/21 1049  Temp    Pulse 60 03/22/21 1049  Resp 15 03/22/21 1049  SpO2 99 % 03/22/21 1049  Vitals shown include unvalidated device data.  Last Pain:  Vitals:   03/22/21 0917  TempSrc: Temporal  PainSc: 7          Complications: No notable events documented.

## 2021-03-22 NOTE — Interval H&P Note (Signed)
History and Physical Interval Note:  03/22/2021 10:13 AM  Arthor Captain  has presented today for surgery, with the diagnosis of CIRRHOSIS.  The various methods of treatment have been discussed with the patient and family. After consideration of risks, benefits and other options for treatment, the patient has consented to  Procedure(s) with comments: COLONOSCOPY WITH PROPOFOL (N/A) - SPANISH INTERPRETER ESOPHAGOGASTRODUODENOSCOPY (EGD) (N/A) as a surgical intervention.  The patient's history has been reviewed, patient examined, no change in status, stable for surgery.  I have reviewed the patient's chart and labs.  Questions were answered to the patient's satisfaction.     [place], [place]

## 2021-03-23 ENCOUNTER — Encounter: Payer: Self-pay | Admitting: Internal Medicine

## 2021-03-23 LAB — SURGICAL PATHOLOGY

## 2021-03-29 DIAGNOSIS — Z0000 Encounter for general adult medical examination without abnormal findings: Secondary | ICD-10-CM | POA: Diagnosis not present

## 2021-03-29 DIAGNOSIS — G4700 Insomnia, unspecified: Secondary | ICD-10-CM | POA: Diagnosis not present

## 2021-03-29 DIAGNOSIS — F331 Major depressive disorder, recurrent, moderate: Secondary | ICD-10-CM | POA: Diagnosis not present

## 2021-03-29 DIAGNOSIS — Z79899 Other long term (current) drug therapy: Secondary | ICD-10-CM | POA: Diagnosis not present

## 2021-03-29 DIAGNOSIS — Z1389 Encounter for screening for other disorder: Secondary | ICD-10-CM | POA: Diagnosis not present

## 2021-03-29 DIAGNOSIS — E039 Hypothyroidism, unspecified: Secondary | ICD-10-CM | POA: Diagnosis not present

## 2021-05-01 DIAGNOSIS — M79605 Pain in left leg: Secondary | ICD-10-CM | POA: Diagnosis not present

## 2021-05-01 DIAGNOSIS — M5442 Lumbago with sciatica, left side: Secondary | ICD-10-CM | POA: Diagnosis not present

## 2021-05-01 DIAGNOSIS — Z23 Encounter for immunization: Secondary | ICD-10-CM | POA: Diagnosis not present

## 2021-05-01 DIAGNOSIS — E039 Hypothyroidism, unspecified: Secondary | ICD-10-CM | POA: Diagnosis not present

## 2021-05-01 DIAGNOSIS — F1024 Alcohol dependence with alcohol-induced mood disorder: Secondary | ICD-10-CM | POA: Diagnosis not present

## 2021-05-01 DIAGNOSIS — F419 Anxiety disorder, unspecified: Secondary | ICD-10-CM | POA: Diagnosis not present

## 2021-05-01 DIAGNOSIS — G4700 Insomnia, unspecified: Secondary | ICD-10-CM | POA: Diagnosis not present

## 2021-05-01 DIAGNOSIS — F339 Major depressive disorder, recurrent, unspecified: Secondary | ICD-10-CM | POA: Diagnosis not present

## 2021-05-24 ENCOUNTER — Emergency Department
Admission: EM | Admit: 2021-05-24 | Discharge: 2021-05-26 | Disposition: A | Discharge status: Other Acute Inpatient Hospital | Payer: Medicare HMO | Attending: Emergency Medicine | Admitting: Emergency Medicine

## 2021-05-24 DIAGNOSIS — E876 Hypokalemia: Secondary | ICD-10-CM | POA: Diagnosis not present

## 2021-05-24 DIAGNOSIS — M545 Low back pain, unspecified: Secondary | ICD-10-CM | POA: Diagnosis present

## 2021-05-24 DIAGNOSIS — R45851 Suicidal ideations: Secondary | ICD-10-CM | POA: Diagnosis not present

## 2021-05-24 DIAGNOSIS — F333 Major depressive disorder, recurrent, severe with psychotic symptoms: Secondary | ICD-10-CM | POA: Insufficient documentation

## 2021-05-24 DIAGNOSIS — F322 Major depressive disorder, single episode, severe without psychotic features: Secondary | ICD-10-CM | POA: Diagnosis present

## 2021-05-24 DIAGNOSIS — Z79899 Other long term (current) drug therapy: Secondary | ICD-10-CM | POA: Diagnosis not present

## 2021-05-24 DIAGNOSIS — F10951 Alcohol use, unspecified with alcohol-induced psychotic disorder with hallucinations: Secondary | ICD-10-CM | POA: Diagnosis present

## 2021-05-24 DIAGNOSIS — F1010 Alcohol abuse, uncomplicated: Secondary | ICD-10-CM | POA: Diagnosis not present

## 2021-05-24 DIAGNOSIS — F32A Depression, unspecified: Secondary | ICD-10-CM | POA: Diagnosis present

## 2021-05-24 DIAGNOSIS — E039 Hypothyroidism, unspecified: Secondary | ICD-10-CM | POA: Insufficient documentation

## 2021-05-24 DIAGNOSIS — I10 Essential (primary) hypertension: Secondary | ICD-10-CM | POA: Insufficient documentation

## 2021-05-24 DIAGNOSIS — F10929 Alcohol use, unspecified with intoxication, unspecified: Secondary | ICD-10-CM | POA: Diagnosis present

## 2021-05-24 DIAGNOSIS — F411 Generalized anxiety disorder: Secondary | ICD-10-CM | POA: Diagnosis present

## 2021-05-24 DIAGNOSIS — F10939 Alcohol use, unspecified with withdrawal, unspecified: Secondary | ICD-10-CM | POA: Diagnosis present

## 2021-05-24 DIAGNOSIS — F10931 Alcohol use, unspecified with withdrawal delirium: Secondary | ICD-10-CM | POA: Diagnosis present

## 2021-05-24 DIAGNOSIS — F1020 Alcohol dependence, uncomplicated: Secondary | ICD-10-CM | POA: Diagnosis present

## 2021-05-24 DIAGNOSIS — F4489 Other dissociative and conversion disorders: Secondary | ICD-10-CM | POA: Diagnosis present

## 2021-05-24 DIAGNOSIS — Z20822 Contact with and (suspected) exposure to covid-19: Secondary | ICD-10-CM | POA: Diagnosis not present

## 2021-05-24 DIAGNOSIS — F10229 Alcohol dependence with intoxication, unspecified: Secondary | ICD-10-CM | POA: Diagnosis present

## 2021-05-24 DIAGNOSIS — M5137 Other intervertebral disc degeneration, lumbosacral region: Secondary | ICD-10-CM | POA: Diagnosis present

## 2021-05-24 DIAGNOSIS — F1024 Alcohol dependence with alcohol-induced mood disorder: Secondary | ICD-10-CM | POA: Diagnosis present

## 2021-05-24 DIAGNOSIS — Y908 Blood alcohol level of 240 mg/100 ml or more: Secondary | ICD-10-CM | POA: Diagnosis not present

## 2021-05-24 DIAGNOSIS — Z720 Tobacco use: Secondary | ICD-10-CM | POA: Diagnosis present

## 2021-05-24 DIAGNOSIS — F419 Anxiety disorder, unspecified: Secondary | ICD-10-CM | POA: Diagnosis present

## 2021-05-24 LAB — CBC WITH DIFFERENTIAL/PLATELET
Abs Immature Granulocytes: 0.02 10*3/uL (ref 0.00–0.07)
Basophils Absolute: 0 10*3/uL (ref 0.0–0.1)
Basophils Relative: 1 %
Eosinophils Absolute: 0.1 10*3/uL (ref 0.0–0.5)
Eosinophils Relative: 1 %
HCT: 40.7 % (ref 39.0–52.0)
Hemoglobin: 14.4 g/dL (ref 13.0–17.0)
Immature Granulocytes: 0 %
Lymphocytes Relative: 44 %
Lymphs Abs: 3.1 10*3/uL (ref 0.7–4.0)
MCH: 31.9 pg (ref 26.0–34.0)
MCHC: 35.4 g/dL (ref 30.0–36.0)
MCV: 90 fL (ref 80.0–100.0)
Monocytes Absolute: 0.6 10*3/uL (ref 0.1–1.0)
Monocytes Relative: 9 %
Neutro Abs: 3.2 10*3/uL (ref 1.7–7.7)
Neutrophils Relative %: 45 %
Platelets: 159 10*3/uL (ref 150–400)
RBC: 4.52 MIL/uL (ref 4.22–5.81)
RDW: 13.1 % (ref 11.5–15.5)
WBC: 7 10*3/uL (ref 4.0–10.5)
nRBC: 0 % (ref 0.0–0.2)

## 2021-05-24 LAB — RESP PANEL BY RT-PCR (FLU A&B, COVID) ARPGX2
Influenza A by PCR: NEGATIVE
Influenza B by PCR: NEGATIVE
SARS Coronavirus 2 by RT PCR: NEGATIVE

## 2021-05-24 LAB — COMPREHENSIVE METABOLIC PANEL
ALT: 14 U/L (ref 0–44)
AST: 24 U/L (ref 15–41)
Albumin: 4.7 g/dL (ref 3.5–5.0)
Alkaline Phosphatase: 56 U/L (ref 38–126)
Anion gap: 12 (ref 5–15)
BUN: 12 mg/dL (ref 6–20)
CO2: 20 mmol/L — ABNORMAL LOW (ref 22–32)
Calcium: 9.5 mg/dL (ref 8.9–10.3)
Chloride: 106 mmol/L (ref 98–111)
Creatinine, Ser: 1.02 mg/dL (ref 0.61–1.24)
GFR, Estimated: >60 mL/min (ref >60)
Glucose, Bld: 97 mg/dL (ref 70–99)
Potassium: 3.4 mmol/L — ABNORMAL LOW (ref 3.5–5.1)
Sodium: 138 mmol/L (ref 135–145)
Total Bilirubin: 0.8 mg/dL (ref 0.3–1.2)
Total Protein: 8.9 g/dL — ABNORMAL HIGH (ref 6.5–8.1)

## 2021-05-24 LAB — URINE DRUG SCREEN, QUALITATIVE (ARMC ONLY)
Amphetamines, Ur Screen: NOT DETECTED
Barbiturates, Ur Screen: NOT DETECTED
Benzodiazepine, Ur Scrn: NOT DETECTED
Cannabinoid 50 Ng, Ur ~~LOC~~: NOT DETECTED
Cocaine Metabolite,Ur ~~LOC~~: NOT DETECTED
MDMA (Ecstasy)Ur Screen: NOT DETECTED
Methadone Scn, Ur: NOT DETECTED
Opiate, Ur Screen: NOT DETECTED
Phencyclidine (PCP) Ur S: NOT DETECTED
Tricyclic, Ur Screen: NOT DETECTED

## 2021-05-24 LAB — ETHANOL: Alcohol, Ethyl (B): 252 mg/dL — ABNORMAL HIGH (ref <10)

## 2021-05-24 LAB — SALICYLATE LEVEL: Salicylate Lvl: <7 mg/dL — ABNORMAL LOW (ref 7.0–30.0)

## 2021-05-24 LAB — ACETAMINOPHEN LEVEL: Acetaminophen (Tylenol), Serum: <10 ug/mL — ABNORMAL LOW (ref 10–30)

## 2021-05-24 MED ORDER — PRAZOSIN HCL 1 MG PO CAPS
1.0000 mg | ORAL_CAPSULE | Freq: Every day | ORAL | Status: DC
  Administered 2021-05-25: 1 mg via ORAL
  Filled 2021-05-24 (×3): qty 1

## 2021-05-24 MED ORDER — LISINOPRIL 5 MG PO TABS
5.0000 mg | ORAL_TABLET | Freq: Every day | ORAL | Status: DC
  Administered 2021-05-24 – 2021-05-26 (×3): 5 mg via ORAL
  Filled 2021-05-24 (×3): qty 1

## 2021-05-24 MED ORDER — LORAZEPAM 2 MG/ML IJ SOLN: 0.0000 mg | Freq: Four times a day (QID) | INTRAMUSCULAR | Status: DC

## 2021-05-24 MED ORDER — THIAMINE HCL 100 MG/ML IJ SOLN
100.0000 mg | Freq: Every day | INTRAMUSCULAR | Status: DC
  Filled 2021-05-24: qty 1

## 2021-05-24 MED ORDER — THIAMINE HCL 100 MG PO TABS
100.0000 mg | ORAL_TABLET | Freq: Every day | ORAL | Status: DC
  Administered 2021-05-24 – 2021-05-26 (×3): 100 mg via ORAL
  Filled 2021-05-24 (×4): qty 1

## 2021-05-24 MED ORDER — PANTOPRAZOLE SODIUM 40 MG PO TBEC
40.0000 mg | DELAYED_RELEASE_TABLET | Freq: Every day | ORAL | Status: DC
  Administered 2021-05-25: 40 mg via ORAL
  Filled 2021-05-24: qty 1

## 2021-05-24 MED ORDER — LORAZEPAM 2 MG/ML IJ SOLN: 0.0000 mg | Freq: Two times a day (BID) | INTRAMUSCULAR | Status: DC

## 2021-05-24 MED ORDER — FLUOXETINE HCL 20 MG PO CAPS
80.0000 mg | ORAL_CAPSULE | Freq: Every day | ORAL | Status: DC
  Administered 2021-05-25 – 2021-05-26 (×2): 80 mg via ORAL
  Filled 2021-05-24 (×3): qty 4

## 2021-05-24 MED ORDER — LEVOTHYROXINE SODIUM 75 MCG PO TABS
125.0000 ug | ORAL_TABLET | Freq: Every day | ORAL | Status: DC
  Administered 2021-05-26: 125 ug via ORAL
  Filled 2021-05-24: qty 2

## 2021-05-24 MED ORDER — QUETIAPINE FUMARATE 200 MG PO TABS
200.0000 mg | ORAL_TABLET | Freq: Every day | ORAL | Status: DC
  Administered 2021-05-24 – 2021-05-25 (×2): 200 mg via ORAL
  Filled 2021-05-24 (×2): qty 1

## 2021-05-24 MED ORDER — LORAZEPAM 2 MG PO TABS
0.0000 mg | ORAL_TABLET | Freq: Four times a day (QID) | ORAL | Status: DC
  Administered 2021-05-24: 2 mg via ORAL
  Administered 2021-05-25 – 2021-05-26 (×3): 1 mg via ORAL
  Filled 2021-05-24 (×4): qty 1

## 2021-05-24 MED ORDER — POTASSIUM CHLORIDE CRYS ER 20 MEQ PO TBCR: 40.0000 meq | EXTENDED_RELEASE_TABLET | Freq: Once | ORAL | Status: DC

## 2021-05-24 MED ORDER — LORAZEPAM 2 MG PO TABS: 0.0000 mg | ORAL_TABLET | Freq: Two times a day (BID) | ORAL | Status: DC

## 2021-05-24 NOTE — Consult Note (Signed)
[facility] Face-to-Face Psychiatry Consult   Reason for Consult: No chief complaint on file.  Referring Physician: Dr. 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 male patient presented to [facility] ED under involuntary commitment status (IVC). The patient with a long history of alcohol abuse, depression, SI, and multiple ER visits. On today's visit, BAL is 252 mg/dl. The patient shared with the EDP that he'd had "99% liquor" yesterday for Halloween; he still feels drunk from this." During his assessment, the patient was seen as visibly intoxicated. The patient presented with slowed psychomotor activity. The patient had significantly slurred unintelligible speech. The patient was disoriented with scattered attention. The patient could endorse passive SI but could not elaborate on any details.  The patient was seen face-to-face by this provider; the chart was reviewed and consulted with Dr. Tamala Julian on 05/24/2021 due to the patient's care. On evaluation, the patient is alert and oriented x 1-2, calm, intoxicated but cooperative, and mood-congruent with affect. It was discussed with  the EDP that the patient remains under observation overnight and will be reassessed in the a.m. to determine if he meets the criteria for psychiatric inpatient admission; he could be discharged home.    HPI: Per Dr. Tamala Julian, Ivan Hamilton is a 55 y.o. male with a past medical history of hypothyroidism, HTN, CVA, chronic pain, B12 deficiency, and alcohol abuse as well as tobacco abuse who presents fairly intoxicated seeking help with his alcohol use and voicing some vague suicidal thoughts.  When asked if he wants to kill himself patient says "yes sometimes."  He is denying any HI.  He states he had some "99% liquor" yesterday for Halloween, he still feels drunk from this.  He denies any other substance abuse or attempt to harm himself prior to arrival.  He denies any hallucinations.  He is denying any acute physical pain or symptoms including fevers, chills, cough, vomiting or diarrhea although further history is somewhat limited secondary to fairly significant intoxication on arrival.  Past Psychiatric History:  Alcohol withdrawal ([place])  Risk to Self:   Risk to Others:   Prior Inpatient Therapy:   Prior Outpatient Therapy:    Past Medical History:  Past Medical History:  Diagnosis Date   Alcohol withdrawal ([place])    B12 deficiency    Chronic pain    CVA (cerebral vascular accident) ([place])    Hypertension    Hypothyroidism    Varicose veins     Past Surgical History:  Procedure Laterality Date   CHOLECYSTECTOMY     COLONOSCOPY WITH PROPOFOL N/A 03/22/2021   Procedure: COLONOSCOPY WITH PROPOFOL;  Surgeon: Toledo, Benay Pike, MD;  Location: [facility] ENDOSCOPY;  Service: Gastroenterology;  Laterality: N/A;  SPANISH  INTERPRETER   ESOPHAGOGASTRODUODENOSCOPY N/A 03/22/2021   Procedure: ESOPHAGOGASTRODUODENOSCOPY (EGD);  Surgeon: Toledo, Benay Pike, MD;  Location: [facility] ENDOSCOPY;  Service: Gastroenterology;  Laterality: N/A;   ESOPHAGOGASTRODUODENOSCOPY (EGD) WITH PROPOFOL N/A 10/24/2017   Procedure:  ESOPHAGOGASTRODUODENOSCOPY (EGD) WITH PROPOFOL;  Surgeon: [name], MD;  Location: [facility] ENDOSCOPY;  Service: Endoscopy;  Laterality: N/A;   ESOPHAGOGASTRODUODENOSCOPY (EGD) WITH PROPOFOL N/A 07/12/2019   Procedure: ESOPHAGOGASTRODUODENOSCOPY (EGD) WITH PROPOFOL;  Surgeon: Jonathon Bellows, MD;  Location: [facility] ENDOSCOPY;  Service: Gastroenterology;  Laterality: N/A;   LEG SURGERY     Family History: No family history on file. Family Psychiatric  History:  Social History:  Social History   Substance and Sexual Activity  Alcohol Use Yes   Alcohol/week: 21.0 standard drinks   Types: 21 Shots of liquor per week   Comment: daily- 1 pint of vodka     Social History   Substance and Sexual Activity  Drug Use No    Social History   Socioeconomic History   Marital status: Married    Spouse name: Not on file   Number of children: Not on file   Years of education: Not on file   Highest education level: Not on file  Occupational History   Not on file  Tobacco Use   Smoking status: Never   Smokeless tobacco: Never  Vaping Use   Vaping Use: Never used  Substance and Sexual Activity   Alcohol use: Yes    Alcohol/week: 21.0 standard drinks    Types: 21 Shots of liquor per week    Comment: daily- 1 pint of vodka   Drug use: No   Sexual activity: Not on file  Other Topics Concern   Not on file  Social History Narrative   Not on file   Social Determinants of Health   Financial Resource Strain: Not on file  Food Insecurity: Not on file  Transportation Needs: Not on file  Physical Activity: Not on file  Stress: Not on file  Social Connections: Not on file   Additional Social History:    Allergies:  No Known Allergies  Labs:  Results for orders placed or performed during the hospital encounter of 05/24/21 (from the past 48 hour(s))  Comprehensive metabolic panel     Status: Abnormal   Collection Time: 05/24/21  9:24 PM  Result Value Ref Range   Sodium 138 135 - 145 mmol/L    Potassium 3.4 (L) 3.5 - 5.1 mmol/L   Chloride 106 98 - 111 mmol/L   CO2 20 (L) 22 - 32 mmol/L   Glucose, Bld 97 70 - 99 mg/dL    Comment: Glucose reference range applies only to samples taken after fasting for at least 8 hours.   BUN 12 6 - 20 mg/dL   Creatinine, Ser 1.02 0.61 - 1.24 mg/dL   Calcium 9.5 8.9 - 10.3 mg/dL   Total Protein 8.9 (H) 6.5 - 8.1 g/dL   Albumin 4.7 3.5 - 5.0 g/dL   AST 24 15 - 41 U/L   ALT 14 0 - 44 U/L   Alkaline Phosphatase 56 38 - 126 U/L   Total Bilirubin 0.8 0.3 - 1.2 mg/dL   GFR, Estimated >60 >60 mL/min    Comment: (NOTE) Calculated using the CKD-EPI Creatinine Equation (2021)    Anion gap 12 5 - 15    Comment: Performed at [facility], [address] [place], [place] [address]  Ethanol     Status: Abnormal   Collection Time: 05/24/21  9:24 PM  Result Value Ref Range   Alcohol, Ethyl (B) 252 (H) <10 mg/dL    Comment: (NOTE) Lowest detectable limit for serum alcohol is 10 mg/dL.  For medical purposes only. Performed at [facility], [address] [place], [place] [address]   Urine Drug Screen, Qualitative     Status: None   Collection Time: 05/24/21  9:24 PM  Result Value Ref Range   Tricyclic, Ur Screen NONE DETECTED NONE DETECTED   Amphetamines, Ur Screen NONE DETECTED NONE DETECTED   MDMA (Ecstasy)Ur Screen NONE DETECTED NONE DETECTED   Cocaine Metabolite,Ur [place] NONE DETECTED NONE DETECTED   Opiate, Ur Screen NONE DETECTED NONE DETECTED   Phencyclidine (PCP) Ur S NONE DETECTED NONE DETECTED   Cannabinoid 50 Ng, Ur [place] NONE DETECTED NONE DETECTED   Barbiturates, Ur Screen NONE DETECTED NONE DETECTED   Benzodiazepine, Ur Scrn NONE DETECTED NONE DETECTED   Methadone Scn, Ur NONE DETECTED NONE DETECTED    Comment: (NOTE) Tricyclics + metabolites, urine    Cutoff 1000 ng/mL Amphetamines + metabolites, urine  Cutoff 1000 ng/mL MDMA (Ecstasy), urine              Cutoff 500 ng/mL Cocaine Metabolite, urine          Cutoff  300 ng/mL Opiate + metabolites, urine        Cutoff 300 ng/mL Phencyclidine (PCP), urine         Cutoff 25 ng/mL Cannabinoid, urine                 Cutoff 50 ng/mL Barbiturates + metabolites, urine  Cutoff 200 ng/mL Benzodiazepine, urine              Cutoff 200 ng/mL Methadone, urine                   Cutoff 300 ng/mL  The urine drug screen provides only a preliminary, unconfirmed analytical test result and should not be used for non-medical purposes. Clinical consideration and professional judgment should be applied to any positive drug screen result due to possible interfering substances. A more specific alternate chemical method must be used in order to obtain a confirmed analytical result. Gas chromatography / mass spectrometry (GC/MS) is the preferred confirm atory method. Performed at [facility], [address] [place], [place] [address]   CBC with Diff     Status: None   Collection Time: 05/24/21  9:24 PM  Result Value Ref Range   WBC 7.0 4.0 - 10.5 K/uL   RBC 4.52 4.22 - 5.81 MIL/uL   Hemoglobin 14.4 13.0 - 17.0 g/dL   HCT 40.7 39.0 - 52.0 %   MCV 90.0 80.0 - 100.0 fL   MCH 31.9 26.0 - 34.0 pg   MCHC 35.4 30.0 - 36.0 g/dL   RDW 13.1 11.5 - 15.5 %   Platelets 159 150 - 400 K/uL   nRBC 0.0 0.0 - 0.2 %   Neutrophils Relative % 45 %   Neutro Abs 3.2 1.7 - 7.7 K/uL   Lymphocytes Relative 44 %   Lymphs Abs 3.1 0.7 - 4.0 K/uL   Monocytes Relative 9 %   Monocytes Absolute 0.6 0.1 - 1.0 K/uL   Eosinophils Relative 1 %   Eosinophils Absolute 0.1 0.0 - 0.5 K/uL   Basophils Relative 1 %   Basophils Absolute 0.0 0.0 - 0.1 K/uL   Immature Granulocytes 0 %   Abs Immature Granulocytes 0.02 0.00 - 0.07 K/uL  Comment: Performed at [facility], [address] [place], [place] [address]  Salicylate level     Status: Abnormal   Collection Time: 05/24/21  9:24 PM  Result Value Ref Range   Salicylate Lvl <0.9 (L) 7.0 - 30.0 mg/dL    Comment: Performed at  [facility] [address] [place], [place] [address]  Acetaminophen level     Status: Abnormal   Collection Time: 05/24/21  9:24 PM  Result Value Ref Range   Acetaminophen (Tylenol), Serum <10 (L) 10 - 30 ug/mL    Comment: (NOTE) Therapeutic concentrations vary significantly. A range of 10-30 ug/mL  may be an effective concentration for many patients. However, some  are best treated at concentrations outside of this range. Acetaminophen concentrations >150 ug/mL at 4 hours after ingestion  and >50 ug/mL at 12 hours after ingestion are often associated with  toxic reactions.  Performed at [facility], [address] [place], [place] [address]   Resp Panel by RT-PCR (Flu A&B, Covid) Nasopharyngeal Swab     Status: None   Collection Time: 05/24/21  9:25 PM   Specimen: Nasopharyngeal Swab; Nasopharyngeal(NP) swabs in vial transport medium  Result Value Ref Range   SARS Coronavirus 2 by RT PCR NEGATIVE NEGATIVE    Comment: (NOTE) SARS-CoV-2 target nucleic acids are NOT DETECTED.  The SARS-CoV-2 RNA is generally detectable in upper respiratory specimens during the acute phase of infection. The lowest concentration of SARS-CoV-2 viral copies this assay can detect is 138 copies/mL. A negative result does not preclude SARS-Cov-2 infection and should not be used as the sole basis for treatment or other patient management decisions. A negative result may occur with  improper specimen collection/handling, submission of specimen other than nasopharyngeal swab, presence of viral mutation(s) within the areas targeted by this assay, and inadequate number of viral copies(<138 copies/mL). A negative result must be combined with clinical observations, patient history, and epidemiological information. The expected result is Negative.  Fact Sheet for Patients:  EntrepreneurPulse.com.au  Fact Sheet for Healthcare Providers:   IncredibleEmployment.be  This test is no t yet approved or cleared by the [place] FDA and  has been authorized for detection and/or diagnosis of SARS-CoV-2 by FDA under an Emergency Use Authorization (EUA). This EUA will remain  in effect (meaning this test can be used) for the duration of the COVID-19 declaration under Section 564(b)(1) of the Act, 21 U.S.C.section 360bbb-3(b)(1), unless the authorization is terminated  or revoked sooner.       Influenza A by PCR NEGATIVE NEGATIVE   Influenza B by PCR NEGATIVE NEGATIVE    Comment: (NOTE) The Xpert Xpress SARS-CoV-2/FLU/RSV plus assay is intended as an aid in the diagnosis of influenza from Nasopharyngeal swab specimens and should not be used as a sole basis for treatment. Nasal washings and aspirates are unacceptable for Xpert Xpress SARS-CoV-2/FLU/RSV testing.  Fact Sheet for Patients: EntrepreneurPulse.com.au  Fact Sheet for Healthcare Providers: IncredibleEmployment.be  This test is not yet approved or cleared by the [place] FDA and has been authorized for detection and/or diagnosis of SARS-CoV-2 by FDA under an Emergency Use Authorization (EUA). This EUA will remain in effect (meaning this test can be used) for the duration of the COVID-19 declaration under Section 564(b)(1) of the Act, 21 U.S.C. section 360bbb-3(b)(1), unless the authorization is terminated or revoked.  Performed at [facility], [address] [place],  [address]     Current Facility-Administered Medications  Medication Dose Route  Frequency Provider Last Rate Last Admin   FLUoxetine (PROZAC) capsule 80 mg  80 mg Oral Daily Lucrezia Starch, MD       [START ON 05/25/2021] levothyroxine (SYNTHROID) tablet 125 mcg  125 mcg Oral Q0600 Lucrezia Starch, MD       lisinopril (ZESTRIL) tablet 5 mg  5 mg Oral Daily Lucrezia Starch, MD   5 mg at 05/24/21 2131   LORazepam  (ATIVAN) injection 0-4 mg  0-4 mg Intravenous Q6H Lucrezia Starch, MD       Or   LORazepam (ATIVAN) tablet 0-4 mg  0-4 mg Oral Q6H Lucrezia Starch, MD   2 mg at 05/24/21 2131   [START ON 05/27/2021] LORazepam (ATIVAN) injection 0-4 mg  0-4 mg Intravenous Q12H Lucrezia Starch, MD       Or   Derrill Memo ON 05/27/2021] LORazepam (ATIVAN) tablet 0-4 mg  0-4 mg Oral Q12H Lucrezia Starch, MD       [START ON 05/25/2021] pantoprazole (PROTONIX) EC tablet 40 mg  40 mg Oral Q1200 Lucrezia Starch, MD       potassium chloride SA (KLOR-CON) CR tablet 40 mEq  40 mEq Oral Once Lucrezia Starch, MD       prazosin (MINIPRESS) capsule 1 mg  1 mg Oral QHS Lucrezia Starch, MD       QUEtiapine (SEROQUEL) tablet 200 mg  200 mg Oral QHS Lucrezia Starch, MD   200 mg at 05/24/21 2131   thiamine tablet 100 mg  100 mg Oral Daily Lucrezia Starch, MD   100 mg at 05/24/21 2131   Or   thiamine (B-1) injection 100 mg  100 mg Intravenous Daily Lucrezia Starch, MD       Current Outpatient Medications  Medication Sig Dispense Refill   acamprosate (CAMPRAL) 333 MG tablet Take 2 tablets (666 mg total) by mouth 3 (three) times daily with meals. 180 tablet 1   busPIRone (BUSPAR) 15 MG tablet Take 15 mg by mouth 2 (two) times daily.     cloNIDine (CATAPRES) 0.1 MG tablet Take 0.1 mg by mouth 3 (three) times daily as needed.     cyclobenzaprine (FLEXERIL) 10 MG tablet Take 1 tablet by mouth 3 (three) times daily.     FLUoxetine (PROZAC) 40 MG capsule Take 2 capsules (80 mg total) by mouth daily. 60 capsule 1   folic acid (FOLVITE) 1 MG tablet Take 1 mg by mouth daily.     gabapentin (NEURONTIN) 800 MG tablet Take 1 tablet (800 mg total) by mouth 3 (three) times daily. 90 tablet 1   HYDROcodone Bitartrate ER 30 MG CP12 Take 30 mg by mouth.     hydrOXYzine (ATARAX/VISTARIL) 25 MG tablet Take 1 tablet (25 mg total) by mouth every 8 (eight) hours as needed for itching. 90 tablet 1   lactulose (CHRONULAC) 10 GM/15ML solution Take 30 g  by mouth 2 (two) times daily.     levothyroxine (SYNTHROID) 125 MCG tablet Take 1 tablet (125 mcg total) by mouth daily at 6 (six) AM. 30 tablet 1   lisinopril (ZESTRIL) 5 MG tablet Take 1 tablet (5 mg total) by mouth daily. 30 tablet 1   LORazepam (ATIVAN) 0.5 MG tablet Take 0.5 mg by mouth daily as needed.     pantoprazole (PROTONIX) 40 MG tablet Take 1 tablet (40 mg total) by mouth daily at 12 noon. 30 tablet 1   prazosin (MINIPRESS) 1 MG capsule Take 1  capsule (1 mg total) by mouth at bedtime. 30 capsule 1   pregabalin (LYRICA) 100 MG capsule Take 100 mg by mouth 2 (two) times daily. (Patient not taking: No sig reported)     QUEtiapine (SEROQUEL) 200 MG tablet Take 1 tablet (200 mg total) by mouth at bedtime. 30 tablet 1   thiamine 100 MG tablet Take 1 tablet (100 mg total) by mouth daily. 30 tablet 1   traZODone (DESYREL) 100 MG tablet Take 1 tablet (100 mg total) by mouth at bedtime as needed for sleep. 30 tablet 1    Musculoskeletal: Strength & Muscle Tone: within normal limits Gait & Station: normal Patient leans: N/A  Psychiatric Specialty Exam:  Presentation  General Appearance: Appropriate for Environment  Eye Contact:Minimal  Speech:Slurred  Speech Volume:Decreased  Handedness:Right   Mood and Affect  Mood:Depressed; Dysphoric  Affect:Congruent; Depressed   Thought Process  Thought Processes:Disorganized  Descriptions of Associations:Loose  Orientation:Partial  Thought Content:Illogical  History of Schizophrenia/Schizoaffective disorder:No  Duration of Psychotic Symptoms:Less than six months  Hallucinations:Hallucinations: None  Ideas of Reference:None  Suicidal Thoughts:Suicidal Thoughts: Yes, Passive SI Passive Intent and/or Plan: Without Intent; Without Plan  Homicidal Thoughts:Homicidal Thoughts: No   Sensorium  Memory:Recent Fair; Remote Fair; Immediate Fair  Judgment:Poor  Insight:Poor   Executive Functions   Concentration:Poor  Attention Span:Poor  Recall:Poor  Fund of Knowledge:Poor  Language:Poor   Psychomotor Activity  Psychomotor Activity:Psychomotor Activity: Normal   Assets  Assets:Communication Skills; Desire for Improvement; Financial Resources/Insurance; Housing; Physical Health; Resilience; Social Support   Sleep  Sleep:Sleep: Good Number of Hours of Sleep: 8   Physical Exam: Physical Exam Vitals and nursing note reviewed.  Constitutional:      Appearance: Normal appearance. He is normal weight.  HENT:     Head: Normocephalic and atraumatic.     Right Ear: External ear normal.     Left Ear: External ear normal.     Nose: Nose normal.     Mouth/Throat:     Mouth: Mucous membranes are moist.  Cardiovascular:     Rate and Rhythm: Normal rate.     Pulses: Normal pulses.  Pulmonary:     Effort: Pulmonary effort is normal.  Musculoskeletal:        General: Normal range of motion.     Cervical back: Normal range of motion and neck supple.  Neurological:     General: No focal deficit present.     Mental Status: He is alert. He is disoriented.  Psychiatric:        Attention and Perception: Attention normal.        Mood and Affect: Mood is depressed. Affect is blunt.        Speech: Speech is delayed and slurred.        Behavior: Behavior is cooperative.        Thought Content: Thought content includes suicidal ideation.        Cognition and Memory: Cognition is impaired. Memory is impaired. He exhibits impaired recent memory and impaired remote memory.        Judgment: Judgment is impulsive and inappropriate.   Review of Systems  Psychiatric/Behavioral:  Positive for substance abuse and suicidal ideas. The patient is nervous/anxious.   All other systems reviewed and are negative. Blood pressure 112/90, pulse 98, temperature 99 F (37.2 C), temperature source Oral, resp. rate 18, SpO2 96 %. There is no height or weight on file to calculate BMI.  Treatment  Plan Summary: Daily contact with patient  to assess and evaluate symptoms and progress in treatment, Medication management, and Plan The patient remained under observation overnight and will be reassessed in the a.m. to determine if he meets the criteria for psychiatric inpatient admission; he could be discharged home.  Disposition: No evidence of imminent risk to self or others at present.   Supportive therapy provided about ongoing stressors. The patient remained under observation overnight and will be reassessed in the a.m. to determine if he meets the criteria for psychiatric inpatient admission; he could be discharged home.  Caroline Sauger, NP 05/24/2021 11:03 PM

## 2021-05-24 NOTE — ED Provider Notes (Addendum)
[facility] Emergency Department Provider Note  ____________________________________________   Event Date/Time   First MD Initiated Contact with Patient 05/24/21 2054     (approximate)  I have reviewed the triage vital signs and the nursing notes.   HISTORY  Chief Complaint No chief complaint on file.   HPI Ivan Hamilton is a 55 y.o. male with a past medical history of hypothyroidism, HTN, CVA, chronic pain, B12 deficiency, and alcohol abuse as well as tobacco abuse who presents fairly intoxicated seeking help with his alcohol use and voicing some vague suicidal thoughts.  When asked if he wants to kill himself patient says "yes sometimes."  He is denying any HI.  He states he had some "99% liquor" yesterday for Halloween, he still feels drunk from this.  He denies any other substance abuse or attempt to harm himself prior to arrival.  He denies any hallucinations.  He is denying any acute physical pain or symptoms including fevers, chills, cough, vomiting or diarrhea although further history is somewhat limited secondary to fairly significant intoxication on arrival.         Past Medical History:  Diagnosis Date   Alcohol withdrawal ([place])    B12 deficiency    Chronic pain    CVA (cerebral vascular accident) ([place])    Hypertension    Hypothyroidism    Varicose veins     Patient Active Problem List   Diagnosis Date Noted   Severe major depression without psychotic features ([place]) 11/07/2020   Generalized anxiety disorder 11/07/2020   RUQ abdominal pain 05/17/2020   Lip swelling    Hypotension 54/65/0354   Metabolic acidosis    Hepatic cirrhosis ([place])    Diverticulitis    Transaminitis    Syncope 07/11/2019   Abdominal pain 07/11/2019   Alcohol abuse 07/11/2019   Thrombocytopenia ([place]) 07/11/2019   Normocytic anemia 07/11/2019   Tobacco abuse 07/11/2019   HTN (hypertension) 07/11/2019   CVA (cerebral vascular accident) ([place])    Major  psychotic depression, recurrent ([place]) 06/01/2019   Hyperbilirubinemia 12/23/2018   Suicidal behavior 08/28/2018   Severe recurrent major depressive disorder with psychotic features ([place]) 08/28/2018   Alcohol intoxication ([place]) 08/28/2018   S/P laparoscopic cholecystectomy 11/02/2017   Duodenitis    Gastritis without bleeding    Hypokalemia 10/22/2017   Intractable vomiting with nausea    Anxiety and depression 04/10/2016   Alcohol withdrawal delirium ([place])    Alcohol withdrawal ([place]) 09/02/2015   Alcohol use disorder, severe, dependence ([place]) 09/02/2015   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[place]) 09/02/2015   Chronic post-traumatic stress disorder (PTSD) 09/01/2015   Hypothyroid 09/01/2015   Gastric reflux 09/01/2015   DDD (degenerative disc disease), lumbosacral 09/01/2015   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   Vitamin B12 deficiency 08/17/2015   Pain of lumbar spine 05/19/2015   Neuropathy 05/17/2015   Varicose vein of leg 04/25/2015   Abnormal LFTs (liver function tests) 03/31/2015   Confusion state 03/31/2015   Chronic pain of left lower extremity 03/31/2015    Past Surgical History:  Procedure Laterality Date   CHOLECYSTECTOMY     COLONOSCOPY WITH PROPOFOL N/A 03/22/2021   Procedure: COLONOSCOPY WITH PROPOFOL;  Surgeon: Toledo, Benay Pike, MD;  Location: [facility] ENDOSCOPY;  Service: Gastroenterology;  Laterality: N/A;  SPANISH INTERPRETER   ESOPHAGOGASTRODUODENOSCOPY N/A 03/22/2021   Procedure: ESOPHAGOGASTRODUODENOSCOPY (EGD);  Surgeon: Toledo, Benay Pike, MD;  Location: [facility] ENDOSCOPY;  Service: Gastroenterology;  Laterality: N/A;  ESOPHAGOGASTRODUODENOSCOPY (EGD) WITH PROPOFOL N/A 10/24/2017   Procedure: ESOPHAGOGASTRODUODENOSCOPY (EGD) WITH PROPOFOL;  Surgeon: [name], MD;  Location: [facility] ENDOSCOPY;  Service: Endoscopy;  Laterality: N/A;   ESOPHAGOGASTRODUODENOSCOPY (EGD) WITH PROPOFOL N/A 07/12/2019    Procedure: ESOPHAGOGASTRODUODENOSCOPY (EGD) WITH PROPOFOL;  Surgeon: Jonathon Bellows, MD;  Location: [facility] ENDOSCOPY;  Service: Gastroenterology;  Laterality: N/A;   LEG SURGERY      Prior to Admission medications   Medication Sig Start Date End Date Taking? Authorizing Provider  acamprosate (CAMPRAL) 333 MG tablet Take 2 tablets (666 mg total) by mouth 3 (three) times daily with meals. 11/11/20   Salley Scarlet, MD  busPIRone (BUSPAR) 15 MG tablet Take 15 mg by mouth 2 (two) times daily. 12/02/20   [provider]  cloNIDine (CATAPRES) 0.1 MG tablet Take 0.1 mg by mouth 3 (three) times daily as needed. 11/14/20   [provider]  cyclobenzaprine (FLEXERIL) 10 MG tablet Take 1 tablet by mouth 3 (three) times daily. 12/01/20   [provider]  FLUoxetine (PROZAC) 40 MG capsule Take 2 capsules (80 mg total) by mouth daily. 11/11/20   Salley Scarlet, MD  folic acid (FOLVITE) 1 MG tablet Take 1 mg by mouth daily.    [provider]  gabapentin (NEURONTIN) 800 MG tablet Take 1 tablet (800 mg total) by mouth 3 (three) times daily. 11/11/20   Salley Scarlet, MD  HYDROcodone Bitartrate ER 30 MG CP12 Take 30 mg by mouth.    [provider]  hydrOXYzine (ATARAX/VISTARIL) 25 MG tablet Take 1 tablet (25 mg total) by mouth every 8 (eight) hours as needed for itching. 11/11/20   Salley Scarlet, MD  lactulose (CHRONULAC) 10 GM/15ML solution Take 30 g by mouth 2 (two) times daily.    [provider]  levothyroxine (SYNTHROID) 125 MCG tablet Take 1 tablet (125 mcg total) by mouth daily at 6 (six) AM. 11/11/20   Salley Scarlet, MD  lisinopril (ZESTRIL) 5 MG tablet Take 1 tablet (5 mg total) by mouth daily. 11/12/20   Salley Scarlet, MD  LORazepam (ATIVAN) 0.5 MG tablet Take 0.5 mg by mouth daily as needed. 12/01/20   [provider]  pantoprazole (PROTONIX) 40 MG tablet Take 1 tablet (40 mg total) by mouth daily at 12 noon. 11/11/20   Salley Scarlet, MD   prazosin (MINIPRESS) 1 MG capsule Take 1 capsule (1 mg total) by mouth at bedtime. 11/11/20   Salley Scarlet, MD  pregabalin (LYRICA) 100 MG capsule Take 100 mg by mouth 2 (two) times daily. Patient not taking: No sig reported 11/14/20   [provider]  QUEtiapine (SEROQUEL) 200 MG tablet Take 1 tablet (200 mg total) by mouth at bedtime. 11/11/20   Salley Scarlet, MD  thiamine 100 MG tablet Take 1 tablet (100 mg total) by mouth daily. 11/12/20   Salley Scarlet, MD  traZODone (DESYREL) 100 MG tablet Take 1 tablet (100 mg total) by mouth at bedtime as needed for sleep. 11/11/20   Salley Scarlet, MD    Allergies Patient has no known allergies.  No family history on file.  Social History Social History   Tobacco Use   Smoking status: Never   Smokeless tobacco: Never  Vaping Use   Vaping Use: Never used  Substance Use Topics   Alcohol use: Yes    Alcohol/week: 21.0 standard drinks    Types: 21 Shots of liquor per week    Comment: daily- 1 pint  of vodka   Drug use: No    Review of Systems  Review of Systems  Unable to perform ROS: Psychiatric disorder  Psychiatric/Behavioral:  Positive for depression, substance abuse and suicidal ideas.      ____________________________________________   PHYSICAL EXAM:  VITAL SIGNS: ED Triage Vitals  Enc Vitals Group     BP      Pulse      Resp      Temp      Temp src      SpO2      Weight      Height      Head Circumference      Peak Flow      Pain Score      Pain Loc      Pain Edu?      Excl. in [place]?    Vitals:   05/24/21 2131 05/24/21 2136  BP: 112/90 112/90  Pulse:  98  Resp:  18  Temp:  99 F (37.2 C)  SpO2:  96%   Physical Exam Vitals and nursing note reviewed.  HENT:     Head: Normocephalic and atraumatic.     Right Ear: External ear normal.     Left Ear: External ear normal.     Nose: Nose normal.     Mouth/Throat:     Mouth: Mucous membranes are moist.  Eyes:     Pupils: Pupils are equal,  round, and reactive to light.  Cardiovascular:     Rate and Rhythm: Normal rate.  Pulmonary:     Effort: Pulmonary effort is normal.  Abdominal:     General: There is no distension.  Musculoskeletal:     Cervical back: No rigidity.  Neurological:     Mental Status: He is alert.  Psychiatric:        Mood and Affect: Mood is depressed.        Speech: Speech is rapid and pressured and slurred.        Thought Content: Thought content includes suicidal ideation. Thought content does not include homicidal ideation.        Cognition and Memory: Cognition is impaired.     ____________________________________________   LABS (all labs ordered are listed, but only abnormal results are displayed)  Labs Reviewed  COMPREHENSIVE METABOLIC PANEL - Abnormal; Notable for the following components:      Result Value   Potassium 3.4 (*)    CO2 20 (*)    Total Protein 8.9 (*)    All other components within normal limits  ETHANOL - Abnormal; Notable for the following components:   Alcohol, Ethyl (B) 252 (*)    All other components within normal limits  SALICYLATE LEVEL - Abnormal; Notable for the following components:   Salicylate Lvl <5.7 (*)    All other components within normal limits  ACETAMINOPHEN LEVEL - Abnormal; Notable for the following components:   Acetaminophen (Tylenol), Serum <10 (*)    All other components within normal limits  RESP PANEL BY RT-PCR (FLU A&B, COVID) ARPGX2  URINE DRUG SCREEN, QUALITATIVE ([facility] ONLY)  CBC WITH DIFFERENTIAL/PLATELET   ____________________________________________  EKG  ____________________________________________  RADIOLOGY  ED MD interpretation:    Official radiology report(s): No results found.  ____________________________________________   PROCEDURES  Procedure(s) performed (including Critical Care):  Procedures   ____________________________________________   INITIAL IMPRESSION / ASSESSMENT AND PLAN / ED COURSE       Patient presents with above-stated history exam for assessment of suicidal thoughts  in the context of being fairly intoxicated.  Patient is ambulatory any significant history on review of records it seems he has a history of alcohol abuse and intoxication.  He is denying any other acute concerns at this time.  He does appear quite intoxicated and certainly possible this is contributing to his suicidal thoughts.  IVC paperwork filled out by this examiner.  Routine psychiatric labs ordered.  Psychiatry TTS consulted.  CBC without leukocytosis or acute anemia.  CMP remarkable for K of 3.4 bicarb of 20 without any other significant electrolyte or metabolic derangements.  Serum ethanol elevated at 252.  Normal anion gap.  Low suspicion for toxic alcohol ingestion at this time.  The patient has been placed in psychiatric observation due to the need to provide a safe environment for the patient while obtaining psychiatric consultation and evaluation, as well as ongoing medical and medication management to treat the patient's condition.  The patient has been placed under full IVC at this time.         ____________________________________________   FINAL CLINICAL IMPRESSION(S) / ED DIAGNOSES  Final diagnoses:  Alcohol abuse  Suicidal ideation  Hypokalemia    Medications  LORazepam (ATIVAN) injection 0-4 mg ( Intravenous See Alternative 05/24/21 2131)    Or  LORazepam (ATIVAN) tablet 0-4 mg (2 mg Oral Given 05/24/21 2131)  LORazepam (ATIVAN) injection 0-4 mg (has no administration in time range)    Or  LORazepam (ATIVAN) tablet 0-4 mg (has no administration in time range)  thiamine tablet 100 mg (100 mg Oral Given 05/24/21 2131)    Or  thiamine (B-1) injection 100 mg ( Intravenous See Alternative 05/24/21 2131)  FLUoxetine (PROZAC) capsule 80 mg (80 mg Oral Patient Refused/Not Given 05/24/21 2135)  levothyroxine (SYNTHROID) tablet 125 mcg (has no administration in time range)  lisinopril  (ZESTRIL) tablet 5 mg (5 mg Oral Given 05/24/21 2131)  pantoprazole (PROTONIX) EC tablet 40 mg (has no administration in time range)  prazosin (MINIPRESS) capsule 1 mg (has no administration in time range)  QUEtiapine (SEROQUEL) tablet 200 mg (200 mg Oral Given 05/24/21 2131)  potassium chloride SA (KLOR-CON) CR tablet 40 mEq (has no administration in time range)     ED Discharge Orders     None        Note:  This document was prepared using Dragon voice recognition software and may include unintentional dictation errors.    Lucrezia Starch, MD 05/24/21 2207    Lucrezia Starch, MD 05/24/21 2233

## 2021-05-24 NOTE — BH Assessment (Signed)
Comprehensive Clinical Assessment (CCA) Note  05/24/2021 [name] [id number]  Recommendations for Services/Supports/Treatments: Consulted with Lynder Parents., NP, who recommended pt be observed overnight and reassessed in the AM.  Ivan Hamilton is a 55 year old, bilingual, Hispanic male with a history of alcohol abuse, depression, SI, and multiple ER visits. Pt presented to [facility] ED under IVC for significant alcohol intoxication. Per EDP Dr. Thompson Caul notes in chart, pt reported that he'd had "99% liquor" yesterday for Halloween, he still feels drunk from this." Upon assessment, the pt was visibly intoxicated. The pt presented with slowed psychomotor activity. Pt had significantly slurred, unintelligible speech. Pt was disoriented with scattered attention. Pt was able to endorse passive SI but was largely unable to elaborate on any details. Pt's BAL was 252. UDS was unremarkable.   Chief Complaint: No chief complaint on file.  Visit Diagnosis: Alcohol abuse, severe    CCA Screening, Triage and Referral (STR)  Patient Reported Information How did you hear about [place]? Self  Referral name: No data recorded Referral phone number: No data recorded  Whom do you see for routine medical problems? -- (Unknown)  Practice/Facility Name: No data recorded Practice/Facility Phone Number: No data recorded Name of Contact: No data recorded Contact Number: No data recorded Contact Fax Number: No data recorded Prescriber Name: No data recorded Prescriber Address (if known): No data recorded  What Is the Reason for Your Visit/Call Today? Alcohol intoxication; passive SI  How Long Has This Been Causing You Problems? > than 6 months  What Do You Feel Would Help You the Most Today? Alcohol or Drug Use Treatment; Treatment for Depression or other mood problem   Have You Recently Been in Any Inpatient Treatment (Hospital/Detox/Crisis Center/28-Day Program)? Yes  Name/Location of  Program/Hospital:[facility]  How Long Were You There? 7 days  When Were You Discharged? 05/18/20   Have You Ever Received Services From [employer] Before? Yes  Who Do You See at [facility]? Mental Health & Medical Treatment   Have You Recently Had Any Thoughts About Hurting Yourself? Yes  Are You Planning to Commit Suicide/Harm Yourself At This time? No   Have you Recently Had Thoughts About [place]? No  Explanation: No data recorded  Have You Used Any Alcohol or Drugs in the Past 24 Hours? Yes  How Long Ago Did You Use Drugs or Alcohol? 2300  What Did You Use and How Much? Alcohol; unknown amount   Do You Currently Have a Therapist/Psychiatrist? Yes  Name of Therapist/Psychiatrist: [place] Recently Discharged From Any Office Practice or Programs? No  Explanation of Discharge From Practice/Program: Patient reports his insurance would only pay for him to stay for 18 days.     CCA Screening Triage Referral Assessment Type of Contact: Face-to-Face  Is this Initial or Reassessment? No data recorded Date Telepsych consult ordered in [facility]:  No data recorded Time Telepsych consult ordered in [facility]:  No data recorded  Patient Reported Information Reviewed? Yes  Patient Left Without Being Seen? No data recorded Reason for Not Completing Assessment: No data recorded  Collateral Involvement: None provided   Does Patient Have a [place]? No data recorded Name and Contact of Legal Guardian: No data recorded If Minor and Not Living with Parent(s), Who has Custody? n/a  Is CPS involved or ever been involved? Never  Is APS involved or ever been involved? Never   Patient Determined To Be At Risk for Harm To Self  or Others Based on Review of Patient Reported Information or Presenting Complaint? Yes, for Self-Harm  Method: No data recorded Availability of Means: No data recorded Intent: No data  recorded Notification Required: No data recorded Additional Information for Danger to Others Potential: No data recorded Additional Comments for Danger to Others Potential: No data recorded Are There Guns or Other Weapons in Your Home? No data recorded Types of Guns/Weapons: No data recorded Are These Weapons Safely Secured?                            No data recorded Who Could Verify You Are Able To Have These Secured: No data recorded Do You Have any Outstanding Charges, Pending Court Dates, Parole/Probation? No data recorded Contacted To Inform of Risk of Harm To Self or Others: Law Enforcement   Location of Assessment: [facility] ED   Does Patient Present under Involuntary Commitment? Yes  IVC Papers Initial File Date: 05/24/21   [place] of Residence: [place]   Patient Currently Receiving the Following Services: Individual Therapy   Determination of Need: Emergent (2 hours)   Options For Referral: ED Visit; Therapeutic Triage Services; Other: Comment (overnight observation and reassessment in the AM)     CCA Biopsychosocial Intake/Chief Complaint:  Patient presented to ER with suicidal ideations and alcohol intoxication  Current Symptoms/Problems: Patient reports having PTSD, depression and has been drinking.   Patient Reported Schizophrenia/Schizoaffective Diagnosis in Past: No   Strengths: unknown  Preferences: unknown  Abilities: unknown   Type of Services Patient Feels are Needed: IP detox or treatment   Initial Clinical Notes/Concerns: No data recorded  Mental Health Symptoms Depression:   Change in energy/activity; Worthlessness; Hopelessness; Tearfulness   Duration of Depressive symptoms:  Greater than two weeks   Mania:   None   Anxiety:    Restlessness; Worrying   Psychosis:   None   Duration of Psychotic symptoms:  Less than six months   Trauma:   Re-experience of traumatic event; Emotional numbing; Difficulty staying/falling asleep;  Detachment from others   Obsessions:   Poor insight   Compulsions:   N/A   Inattention:   N/A   Hyperactivity/Impulsivity:   None   Oppositional/Defiant Behaviors:   N/A   Emotional Irregularity:   Potentially harmful impulsivity; Recurrent suicidal behaviors/gestures/threats; Intense/unstable relationships   Other Mood/Personality Symptoms:  No data recorded   Mental Status Exam Appearance and self-care  Stature:   Average   Weight:   Average weight   Clothing:   Casual   Grooming:   Normal   Cosmetic use:   None   Posture/gait:   Normal   Motor activity:   Not Remarkable   Sensorium  Attention:   Unaware   Concentration:   Scattered   Orientation:   -- (Intoxicated; disoriented)   Recall/memory:   Normal   Affect and Mood  Affect:   Depressed   Mood:   Depressed; Dysphoric   Relating  Eye contact:   Fleeting   Facial expression:   Depressed   Attitude toward examiner:   Cooperative   Thought and Language  Speech flow:  Slurred   Thought content:   Appropriate to Mood and Circumstances   Preoccupation:   None   Hallucinations:   None   Organization:  No data recorded  [employer] of Knowledge:   Average   Intelligence:   Average   Abstraction:   Normal   Judgement:  Impaired   Reality Testing:   Distorted   Insight:   Unaware   Decision Making:   Impulsive   Social Functioning  Social Maturity:   Irresponsible; Impulsive   Social Judgement:   Victimized   Stress  Stressors:   Illness   Coping Ability:   Programme researcher, broadcasting/film/video Deficits:   Communication; Decision making   Supports:   Family     Religion: Religion/Spirituality Are You A Religious Person?: No  Leisure/Recreation: Leisure / Recreation Do You Have Hobbies?: Yes Leisure and Hobbies: Per last pt assessment, pt states that enjoys doing things around the house  Exercise/Diet: Exercise/Diet Do You  Exercise?: No Have You Gained or Lost A Significant Amount of Weight in the Past Six Months?: No Do You Follow a Special Diet?: No Do You Have Any Trouble Sleeping?: Yes   CCA Employment/Education Employment/Work Situation: Employment / Work Situation Employment Situation: On disability Why is Patient on Disability: Mental health and injuries How Long has Patient Been on Disability: "a little while" Patient's Job has Been Impacted by Current Illness: No Has Patient ever Been in the [employer]?: Yes (Describe in comment) 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  Education: Education Is Patient Currently Attending School?: No Did Physicist, medical?: No Did You Have An Individualized Education Program (IIEP): No Did You Have Any Difficulty At School?: No Patient's Education Has Been Impacted by Current Illness: No   CCA Family/Childhood History Family and Relationship History: Family history Marital status: Single Does patient have children?: Yes How many children?: 2 How is patient's relationship with their children?: Per last assessment, "good relationships"  Childhood History:  Childhood History By whom was/is the patient raised?: Both parents Did patient suffer any verbal/emotional/physical/sexual abuse as a child?: No Did patient suffer from severe childhood neglect?: No Has patient ever been sexually abused/assaulted/raped as an adolescent or adult?: No Was the patient ever a victim of a crime or a disaster?: No Witnessed domestic violence?: No Has patient been affected by domestic violence as an adult?: No  Child/Adolescent Assessment:     CCA Substance Use Alcohol/Drug Use: Alcohol / Drug Use Pain Medications: See PTA Prescriptions: See PTA Over the Counter: See PTA History of alcohol / drug use?: Yes Longest period of sobriety (when/how long): unknown Negative  Consequences of Use: Personal relationships, Work / Youth worker Withdrawal Symptoms: Agitation, Tremors, Sweats                         ASAM's:  Six Dimensions of Multidimensional Assessment  Dimension 1:  Acute Intoxication and/or Withdrawal Potential:   Dimension 1:  Description of individual's past and current experiences of substance use and withdrawal: Patient has had past medical admission due to withdrawals  Dimension 2:  Biomedical Conditions and Complications:   Dimension 2:  Description of patient's biomedical conditions and  complications: Patient has medical concerns where his drinking interferes  Dimension 3:  Emotional, Behavioral, or Cognitive Conditions and Complications:  Dimension 3:  Description of emotional, behavioral, or cognitive conditions and complications: Due to patient's history of PTSD  Dimension 4:  Readiness to Change:     Dimension 5:  Relapse, Continued use, or Continued Problem Potential:  Dimension 5:  Relapse, continued use, or continued problem potential critiera description: Chronic history of relapse  Dimension 6:  Recovery/Living Environment:     ASAM Severity Score: ASAM's Severity Rating Score:  18  ASAM Recommended Level of Treatment: ASAM Recommended Level of Treatment: Level III Residential Treatment   Substance use Disorder (SUD) Substance Use Disorder (SUD)  Checklist Symptoms of Substance Use: Continued use despite having a persistent/recurrent physical/psychological problem caused/exacerbated by use, Evidence of withdrawal (Comment), Evidence of tolerance, Persistent desire or unsuccessful efforts to cut down or control use, Presence of craving or strong urge to use, Substance(s) often taken in larger amounts or over longer times than was intended  Recommendations for Services/Supports/Treatments: Recommendations for Services/Supports/Treatments Recommendations For Services/Supports/Treatments: Inpatient Hospitalization, SAIOP (Substance  Abuse Intensive Outpatient Program), Medication Management  DSM5 Diagnoses: Patient Active Problem List   Diagnosis Date Noted   Severe major depression without psychotic features ([place]) 11/07/2020   Generalized anxiety disorder 11/07/2020   RUQ abdominal pain 05/17/2020   Lip swelling    Hypotension 56/38/7564   Metabolic acidosis    Hepatic cirrhosis ([employer])    Diverticulitis    Transaminitis    Syncope 07/11/2019   Abdominal pain 07/11/2019   Alcohol abuse 07/11/2019   Thrombocytopenia ([place]) 07/11/2019   Normocytic anemia 07/11/2019   Tobacco abuse 07/11/2019   HTN (hypertension) 07/11/2019   CVA (cerebral vascular accident) ([place])    Major psychotic depression, recurrent ([place]) 06/01/2019   Hyperbilirubinemia 12/23/2018   Suicidal behavior 08/28/2018   Severe recurrent major depressive disorder with psychotic features ([place]) 08/28/2018   Alcohol intoxication ([place]) 08/28/2018   S/P laparoscopic cholecystectomy 11/02/2017   Duodenitis    Gastritis without bleeding    Hypokalemia 10/22/2017   Intractable vomiting with nausea    Anxiety and depression 04/10/2016   Alcohol withdrawal delirium ([place])    Alcohol withdrawal ([place]) 09/02/2015   Alcohol use disorder, severe, dependence ([place]) 09/02/2015   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

## 2021-05-24 NOTE — ED Notes (Signed)
Pt. Was dressed out by this tech and security outside of rm 20. Pt. Was provided with hospital attire scrub (top/pants) with socks/underwear. All of pt. Belongings were placed in hospital bag that was also provided. Pt. Bag was labeled with pt. Info and green sticker.   Belongs were:  Multi-color underwear Brown socks Navy pants [employer] Pair of black slides Camo hat

## 2021-05-24 NOTE — ED Notes (Signed)
Patient walked to room from triage. Patient stating, "I want to talk to the doctor. You can't help me. I don't speak english." Patient was speaking english with no issues. EDP Dr. Tamala Julian came to room and spoke with patient. Patient is visually intoxicated. Patient asking for alcohol to drink. Sandwich tray and sprite given to patient. Patient dressed out and labs obtained by ED tech Jenny Reichmann.

## 2021-05-25 DIAGNOSIS — F1010 Alcohol abuse, uncomplicated: Secondary | ICD-10-CM | POA: Diagnosis not present

## 2021-05-25 NOTE — ED Notes (Signed)
VOL, pend placement at [facility] 11.4.2022 after 8am

## 2021-05-25 NOTE — ED Notes (Signed)
Breakfast placed in room.

## 2021-05-25 NOTE — ED Notes (Signed)
Given dinner tray

## 2021-05-25 NOTE — ED Notes (Signed)
Pt given lunch tray.

## 2021-05-25 NOTE — Consult Note (Signed)
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 male patient admitted with suicidal ideation and alcohol intoxication.Marland Kitchen  HPI:  Patient seen and chart reviewed. Patient states he was drinking heavily on 05/22/21. He called 911 for transport to hospital because he states that he had vomiting and diarrhea that would not stop. Patient denies any vomiting or diarrhea since hospitalization, RN verifies this information. Patient has very slight tremor. On CIWA protocol. He is not sure if he has ever had a seizure after stopping drinking. Patient denies suicidal ideation at this time, denies homicidal ideation. States he has chronic auditory hallucinations, seeing "shadows of people" and "hears his voice being called." These are not concerning to him. Does not have AVH at this time. Does not appear to be responding to internal stimuli.  Patient is calm and cooperative this morning. He states that he was in [facility] after his hospitalization her in April.  Patient states that he feels he needs to stay in the treatment center for a longer period of time. Patient has had multiple psychiatric admissions without added benefit. Patient is in agreement to be referred to inpatient substance abuse facilities and/or outpatient resources,  Past Psychiatric History: Alcohol use disorder; MDD; PTSD. Several Prior hospitalizations  Risk to Self:   Risk to Others:   Prior Inpatient Therapy:   Prior Outpatient Therapy:    Past Medical History:  Past Medical History:  Diagnosis Date   Alcohol withdrawal ([place])    B12 deficiency    Chronic pain    CVA (cerebral vascular accident) ([place])    Hypertension    Hypothyroidism    Varicose veins     Past Surgical History:  Procedure Laterality Date   CHOLECYSTECTOMY     COLONOSCOPY WITH PROPOFOL N/A 03/22/2021   Procedure: COLONOSCOPY WITH PROPOFOL;  Surgeon: Toledo, Benay Pike, MD;  Location: [facility] ENDOSCOPY;  Service: Gastroenterology;  Laterality: N/A;  SPANISH INTERPRETER   ESOPHAGOGASTRODUODENOSCOPY N/A 03/22/2021   Procedure: ESOPHAGOGASTRODUODENOSCOPY (EGD);  Surgeon: Toledo, Benay Pike, MD;  Location: [facility] ENDOSCOPY;  Service: Gastroenterology;  Laterality: N/A;   ESOPHAGOGASTRODUODENOSCOPY (EGD) WITH PROPOFOL N/A 10/24/2017   Procedure: ESOPHAGOGASTRODUODENOSCOPY (EGD) WITH PROPOFOL;  Surgeon: [name], MD;  Location: [facility] ENDOSCOPY;  Service: Endoscopy;  Laterality: N/A;   ESOPHAGOGASTRODUODENOSCOPY (EGD) WITH PROPOFOL N/A 07/12/2019   Procedure: ESOPHAGOGASTRODUODENOSCOPY (EGD) WITH PROPOFOL;  Surgeon: Jonathon Bellows, MD;  Location: [facility] ENDOSCOPY;  Service: Gastroenterology;  Laterality: N/A;   LEG SURGERY     Family History: No family history on file. Family Psychiatric  History: Per chart review, history of alcohol related illnesses in family Social History:  Social  History   Substance and Sexual Activity  Alcohol Use Yes   Alcohol/week: 21.0 standard drinks   Types: 21 Shots of liquor per week    Comment: daily- 1 pint of vodka     Social History   Substance and Sexual Activity  Drug Use No    Social History   Socioeconomic History   Marital status: Married    Spouse name: Not on file   Number of children: Not on file   Years of education: Not on file   Highest education level: Not on file  Occupational History   Not on file  Tobacco Use   Smoking status: Never   Smokeless tobacco: Never  Vaping Use   Vaping Use: Never used  Substance and Sexual Activity   Alcohol use: Yes    Alcohol/week: 21.0 standard drinks    Types: 21 Shots of liquor per week    Comment: daily- 1 pint of vodka   Drug use: No   Sexual activity: Not on file  Other Topics Concern   Not on file  Social History Narrative   Not on file   Social Determinants of Health   Financial Resource Strain: Not on file  Food Insecurity: Not on file  Transportation Needs: Not on file  Physical Activity: Not on file  Stress: Not on file  Social Connections: Not on file   Additional Social History:    Allergies:  No Known Allergies  Labs:  Results for orders placed or performed during the hospital encounter of 05/24/21 (from the past 48 hour(s))  Comprehensive metabolic panel     Status: Abnormal   Collection Time: 05/24/21  9:24 PM  Result Value Ref Range   Sodium 138 135 - 145 mmol/L   Potassium 3.4 (L) 3.5 - 5.1 mmol/L   Chloride 106 98 - 111 mmol/L   CO2 20 (L) 22 - 32 mmol/L   Glucose, Bld 97 70 - 99 mg/dL    Comment: Glucose reference range applies only to samples taken after fasting for at least 8 hours.   BUN 12 6 - 20 mg/dL   Creatinine, Ser 1.02 0.61 - 1.24 mg/dL   Calcium 9.5 8.9 - 10.3 mg/dL   Total Protein 8.9 (H) 6.5 - 8.1 g/dL   Albumin 4.7 3.5 - 5.0 g/dL   AST 24 15 - 41 U/L   ALT 14 0 - 44 U/L   Alkaline Phosphatase 56 38 - 126 U/L   Total Bilirubin 0.8 0.3 - 1.2 mg/dL   GFR, Estimated >60 >60 mL/min    Comment: (NOTE) Calculated using the CKD-EPI Creatinine Equation  (2021)    Anion gap 12 5 - 15    Comment: Performed at [facility], [address] [place], [place] [address]  Ethanol     Status: Abnormal   Collection Time: 05/24/21  9:24 PM  Result Value Ref Range   Alcohol, Ethyl (B) 252 (H) <10 mg/dL    Comment: (NOTE) Lowest detectable limit for serum alcohol is 10 mg/dL.  For medical purposes only. Performed at [facility], [address] [place], [place] [address]   Urine Drug Screen, Qualitative     Status: None   Collection Time: 05/24/21  9:24 PM  Result Value Ref Range   Tricyclic, Ur Screen NONE DETECTED NONE DETECTED   Amphetamines, Ur Screen NONE DETECTED NONE DETECTED   MDMA (Ecstasy)Ur Screen NONE DETECTED NONE DETECTED   Cocaine Metabolite,Ur [place] NONE DETECTED NONE DETECTED  Opiate, Ur Screen NONE DETECTED NONE DETECTED   Phencyclidine (PCP) Ur S NONE DETECTED NONE DETECTED   Cannabinoid 50 Ng, Ur [place] NONE DETECTED NONE DETECTED   Barbiturates, Ur Screen NONE DETECTED NONE DETECTED   Benzodiazepine, Ur Scrn NONE DETECTED NONE DETECTED   Methadone Scn, Ur NONE DETECTED NONE DETECTED    Comment: (NOTE) Tricyclics + metabolites, urine    Cutoff 1000 ng/mL Amphetamines + metabolites, urine  Cutoff 1000 ng/mL MDMA (Ecstasy), urine              Cutoff 500 ng/mL Cocaine Metabolite, urine          Cutoff 300 ng/mL Opiate + metabolites, urine        Cutoff 300 ng/mL Phencyclidine (PCP), urine         Cutoff 25 ng/mL Cannabinoid, urine                 Cutoff 50 ng/mL Barbiturates + metabolites, urine  Cutoff 200 ng/mL Benzodiazepine, urine              Cutoff 200 ng/mL Methadone, urine                   Cutoff 300 ng/mL  The urine drug screen provides only a preliminary, unconfirmed analytical test result and should not be used for non-medical purposes. Clinical consideration and professional judgment should be applied to any positive drug screen result due to possible interfering substances. A more specific  alternate chemical method must be used in order to obtain a confirmed analytical result. Gas chromatography / mass spectrometry (GC/MS) is the preferred confirm atory method. Performed at [facility], [address] [place], [place] [address]   CBC with Diff     Status: None   Collection Time: 05/24/21  9:24 PM  Result Value Ref Range   WBC 7.0 4.0 - 10.5 K/uL   RBC 4.52 4.22 - 5.81 MIL/uL   Hemoglobin 14.4 13.0 - 17.0 g/dL   HCT 40.7 39.0 - 52.0 %   MCV 90.0 80.0 - 100.0 fL   MCH 31.9 26.0 - 34.0 pg   MCHC 35.4 30.0 - 36.0 g/dL   RDW 13.1 11.5 - 15.5 %   Platelets 159 150 - 400 K/uL   nRBC 0.0 0.0 - 0.2 %   Neutrophils Relative % 45 %   Neutro Abs 3.2 1.7 - 7.7 K/uL   Lymphocytes Relative 44 %   Lymphs Abs 3.1 0.7 - 4.0 K/uL   Monocytes Relative 9 %   Monocytes Absolute 0.6 0.1 - 1.0 K/uL   Eosinophils Relative 1 %   Eosinophils Absolute 0.1 0.0 - 0.5 K/uL   Basophils Relative 1 %   Basophils Absolute 0.0 0.0 - 0.1 K/uL   Immature Granulocytes 0 %   Abs Immature Granulocytes 0.02 0.00 - 0.07 K/uL    Comment: Performed at [facility], [address] [place], [place] [address]  Salicylate level     Status: Abnormal   Collection Time: 05/24/21  9:24 PM  Result Value Ref Range   Salicylate Lvl <9.6 (L) 7.0 - 30.0 mg/dL    Comment: Performed at [facility], [address] [place], [place] [address]  Acetaminophen level     Status: Abnormal   Collection Time: 05/24/21  9:24 PM  Result Value Ref Range   Acetaminophen (Tylenol), Serum <10 (L) 10 - 30 ug/mL    Comment: (NOTE) Therapeutic concentrations vary significantly. A range of 10-30 ug/mL  may be an effective concentration for many patients. However, some  are best treated at concentrations outside of this range. Acetaminophen concentrations >150 ug/mL at 4 hours after ingestion  and >50 ug/mL at 12 hours after ingestion are often associated with  toxic reactions.  Performed at  [facility], [address] [place], [place] [address]   Resp Panel by RT-PCR (Flu A&B, Covid) Nasopharyngeal Swab     Status: None   Collection Time: 05/24/21  9:25 PM   Specimen: Nasopharyngeal Swab; Nasopharyngeal(NP) swabs in vial transport medium  Result Value Ref Range   SARS Coronavirus 2 by RT PCR NEGATIVE NEGATIVE    Comment: (NOTE) SARS-CoV-2 target nucleic acids are NOT DETECTED.  The SARS-CoV-2 RNA is generally detectable in upper respiratory specimens during the acute phase of infection. The lowest concentration of SARS-CoV-2 viral copies this assay can detect is 138 copies/mL. A negative result does not preclude SARS-Cov-2 infection and should not be used as the sole basis for treatment or other patient management decisions. A negative result may occur with  improper specimen collection/handling, submission of specimen other than nasopharyngeal swab, presence of viral mutation(s) within the areas targeted by this assay, and inadequate number of viral copies(<138 copies/mL). A negative result must be combined with clinical observations, patient history, and epidemiological information. The expected result is Negative.  Fact Sheet for Patients:  EntrepreneurPulse.com.au  Fact Sheet for Healthcare Providers:  IncredibleEmployment.be  This test is no t yet approved or cleared by the [place] FDA and  has been authorized for detection and/or diagnosis of SARS-CoV-2 by FDA under an Emergency Use Authorization (EUA). This EUA will remain  in effect (meaning this test can be used) for the duration of the COVID-19 declaration under Section 564(b)(1) of the Act, 21 U.S.C.section 360bbb-3(b)(1), unless the authorization is terminated  or revoked sooner.       Influenza A by PCR NEGATIVE NEGATIVE   Influenza B by PCR NEGATIVE NEGATIVE    Comment: (NOTE) The Xpert Xpress SARS-CoV-2/FLU/RSV plus assay is intended as an  aid in the diagnosis of influenza from Nasopharyngeal swab specimens and should not be used as a sole basis for treatment. Nasal washings and aspirates are unacceptable for Xpert Xpress SARS-CoV-2/FLU/RSV testing.  Fact Sheet for Patients: EntrepreneurPulse.com.au  Fact Sheet for Healthcare Providers: IncredibleEmployment.be  This test is not yet approved or cleared by the [place] FDA and has been authorized for detection and/or diagnosis of SARS-CoV-2 by FDA under an Emergency Use Authorization (EUA). This EUA will remain in effect (meaning this test can be used) for the duration of the COVID-19 declaration under Section 564(b)(1) of the Act, 21 U.S.C. section 360bbb-3(b)(1), unless the authorization is terminated or revoked.  Performed at [facility], [address] [place], [place] [address]     Current Facility-Administered Medications  Medication Dose Route Frequency Provider Last Rate Last Admin   FLUoxetine (PROZAC) capsule 80 mg  80 mg Oral Daily Lucrezia Starch, MD   80 mg at 05/25/21 0936   levothyroxine (SYNTHROID) tablet 125 mcg  125 mcg Oral Q0600 Lucrezia Starch, MD       lisinopril (ZESTRIL) tablet 5 mg  5 mg Oral Daily Lucrezia Starch, MD   5 mg at 05/25/21 0936   LORazepam (ATIVAN) injection 0-4 mg  0-4 mg Intravenous Q6H Lucrezia Starch, MD       Or   LORazepam (ATIVAN) tablet 0-4 mg  0-4 mg Oral Q6H Hulan Saas  P, MD   2 mg at 05/24/21 2131   [START ON 05/27/2021] LORazepam (ATIVAN) injection 0-4 mg  0-4 mg Intravenous Q12H Lucrezia Starch, MD       Or   Derrill Memo ON 05/27/2021] LORazepam (ATIVAN) tablet 0-4 mg  0-4 mg Oral Q12H Lucrezia Starch, MD       pantoprazole (PROTONIX) EC tablet 40 mg  40 mg Oral Q1200 Lucrezia Starch, MD       potassium chloride SA (KLOR-CON) CR tablet 40 mEq  40 mEq Oral Once Lucrezia Starch, MD       prazosin (MINIPRESS) capsule 1 mg  1 mg Oral QHS Lucrezia Starch, MD        QUEtiapine (SEROQUEL) tablet 200 mg  200 mg Oral QHS Lucrezia Starch, MD   200 mg at 05/24/21 2131   thiamine tablet 100 mg  100 mg Oral Daily Lucrezia Starch, MD   100 mg at 05/25/21 1093   Or   thiamine (B-1) injection 100 mg  100 mg Intravenous Daily Lucrezia Starch, MD       Current Outpatient Medications  Medication Sig Dispense Refill   acamprosate (CAMPRAL) 333 MG tablet Take 2 tablets (666 mg total) by mouth 3 (three) times daily with meals. 180 tablet 1   busPIRone (BUSPAR) 15 MG tablet Take 15 mg by mouth 2 (two) times daily.     cloNIDine (CATAPRES) 0.1 MG tablet Take 0.1 mg by mouth 3 (three) times daily as needed.     cyclobenzaprine (FLEXERIL) 10 MG tablet Take 1 tablet by mouth 3 (three) times daily.     FLUoxetine (PROZAC) 40 MG capsule Take 2 capsules (80 mg total) by mouth daily. 60 capsule 1   folic acid (FOLVITE) 1 MG tablet Take 1 mg by mouth daily.     gabapentin (NEURONTIN) 800 MG tablet Take 1 tablet (800 mg total) by mouth 3 (three) times daily. 90 tablet 1   HYDROcodone Bitartrate ER 30 MG CP12 Take 30 mg by mouth.     hydrOXYzine (ATARAX/VISTARIL) 25 MG tablet Take 1 tablet (25 mg total) by mouth every 8 (eight) hours as needed for itching. 90 tablet 1   lactulose (CHRONULAC) 10 GM/15ML solution Take 30 g by mouth 2 (two) times daily.     levothyroxine (SYNTHROID) 125 MCG tablet Take 1 tablet (125 mcg total) by mouth daily at 6 (six) AM. 30 tablet 1   lisinopril (ZESTRIL) 5 MG tablet Take 1 tablet (5 mg total) by mouth daily. 30 tablet 1   LORazepam (ATIVAN) 0.5 MG tablet Take 0.5 mg by mouth daily as needed.     pantoprazole (PROTONIX) 40 MG tablet Take 1 tablet (40 mg total) by mouth daily at 12 noon. 30 tablet 1   prazosin (MINIPRESS) 1 MG capsule Take 1 capsule (1 mg total) by mouth at bedtime. 30 capsule 1   pregabalin (LYRICA) 100 MG capsule Take 100 mg by mouth 2 (two) times daily. (Patient not taking: No sig reported)     QUEtiapine (SEROQUEL) 200 MG  tablet Take 1 tablet (200 mg total) by mouth at bedtime. 30 tablet 1   thiamine 100 MG tablet Take 1 tablet (100 mg total) by mouth daily. 30 tablet 1   traZODone (DESYREL) 100 MG tablet Take 1 tablet (100 mg total) by mouth at bedtime as needed for sleep. 30 tablet 1    Musculoskeletal: Strength & Muscle Tone: within normal limits Gait & Station: normal  Patient leans: N/A            Psychiatric Specialty Exam:  Presentation  General Appearance: Appropriate for Environment  Eye Contact:Good  Speech:Clear and Coherent  Speech Volume:Normal  Handedness:Right   Mood and Affect  Mood:Depressed  Affect:Appropriate   Thought Process  Thought Processes:Coherent  Descriptions of Associations:Intact  Orientation:Full (Time, Place and Person)  Thought Content:Logical  History of Schizophrenia/Schizoaffective disorder:No  Duration of Psychotic Symptoms:Less than six months  Hallucinations:Hallucinations: Auditory Description of Auditory Hallucinations: "Calling my name"  chronic  Ideas of Reference:None  Suicidal Thoughts:Suicidal Thoughts: No SI Passive Intent and/or Plan: Without Intent; Without Plan  Homicidal Thoughts:Homicidal Thoughts: No   Sensorium  Memory:Immediate Good  Judgment:Fair  Insight:Fair   Executive Functions  Concentration:Good  Attention Span:Good  [place]  Language:Poor   Psychomotor Activity  Psychomotor Activity:Psychomotor Activity: Normal   Assets  Assets:Resilience; Desire for Improvement   Sleep  Sleep:Sleep: Poor Number of Hours of Sleep: 8   Physical Exam: Physical Exam Vitals and nursing note reviewed.  HENT:     Head: Normocephalic.     Nose: No congestion or rhinorrhea.  Eyes:     General:        Right eye: No discharge.        Left eye: No discharge.  Pulmonary:     Effort: Pulmonary effort is normal.  Musculoskeletal:        General: Normal range of  motion.     Cervical back: Normal range of motion.  Skin:    General: Skin is dry.  Neurological:     Mental Status: He is alert and oriented to person, place, and time.  Psychiatric:        Attention and Perception: Attention normal.        Mood and Affect: Mood is depressed (stable).        Behavior: Behavior normal.        Thought Content: Thought content normal.        Cognition and Memory: Cognition normal.   ROS Blood pressure 115/75, pulse 95, temperature 98.8 F (37.1 C), temperature source Oral, resp. rate 16, SpO2 98 %. There is no height or weight on file to calculate BMI.  Treatment Plan Summary: Plan 55 year old male with MDD, PTSD, Alcohol use disorder presenting intoxicated to ED via EMS.At time of presentation he was endorsing suicidal ideation. Patient is alert and oriented x 4 at this time and does not endorse SI/HI or AVH at his time, although he states he has chronic auditory and visual hallucinations. Patient acknowledges need for termination of his alcohol use and is requesting referrals to substance use facilities, TTS will assist.  Discharge when medically cleared.   Disposition: No evidence of imminent risk to self or others at present.   Patient does not meet criteria for psychiatric inpatient admission. Supportive therapy provided about ongoing stressors. Discussed crisis plan, support from social network, calling 911, coming to the Emergency Department, and calling Suicide Hotline.TTS to refer patient for alcohol use treatment centers,   Sherlon Handing, NP 05/25/2021 10:52 AM

## 2021-05-25 NOTE — BH Assessment (Signed)
Patient has been accepted to [facility].  Patient assigned to [place] [facility] Accepting physician is Dr. Ala Dach.  Call report to [phone].  Representative was [employer].   ER Staff is aware of it:  [place], ER Secretary  Dr. Cinda Quest, ER MD  [facility] Patient's Nurse   Address: [address]  [place], [place] [address]  Patient bed will be available tomorrow (05/26/2021) after 8am

## 2021-05-25 NOTE — BH Assessment (Signed)
Writer spoke with patient and updated him [facility] and [facility] offered a bed. Patient stated he wanted to go to [facility].  Writer attempted to contact [facility]([phone], [phone] and [phone]) and let them know the patient would not come to them but was unable to reach anyone.

## 2021-05-25 NOTE — ED Notes (Signed)
IVC, AM reassessment for possible IP treatment or discharge to home

## 2021-05-25 NOTE — BH Assessment (Signed)
Referral information for Psychiatric Hospitalization faxed to;   [facility] [phone]),   [facility] [phone])  [facility] [phone]),  [facility] [phone])  [facility] [phone] or [phone])  [facility] [phone]),   [facility] [phone] -or- [phone]),   [facility] [phone])  [facility] [phone]).  [facility] [phone])  [facility] [phone])

## 2021-05-25 NOTE — ED Notes (Signed)
VOL, pend placement 

## 2021-05-26 DIAGNOSIS — F209 Schizophrenia, unspecified: Secondary | ICD-10-CM | POA: Diagnosis not present

## 2021-05-26 DIAGNOSIS — K219 Gastro-esophageal reflux disease without esophagitis: Secondary | ICD-10-CM | POA: Diagnosis not present

## 2021-05-26 DIAGNOSIS — F1020 Alcohol dependence, uncomplicated: Secondary | ICD-10-CM | POA: Diagnosis not present

## 2021-05-26 DIAGNOSIS — F333 Major depressive disorder, recurrent, severe with psychotic symptoms: Secondary | ICD-10-CM | POA: Diagnosis not present

## 2021-05-26 DIAGNOSIS — F1010 Alcohol abuse, uncomplicated: Secondary | ICD-10-CM | POA: Diagnosis not present

## 2021-05-26 DIAGNOSIS — Z20822 Contact with and (suspected) exposure to covid-19: Secondary | ICD-10-CM | POA: Diagnosis not present

## 2021-05-26 DIAGNOSIS — F315 Bipolar disorder, current episode depressed, severe, with psychotic features: Secondary | ICD-10-CM | POA: Diagnosis not present

## 2021-05-26 DIAGNOSIS — E039 Hypothyroidism, unspecified: Secondary | ICD-10-CM | POA: Diagnosis not present

## 2021-05-26 DIAGNOSIS — R45851 Suicidal ideations: Secondary | ICD-10-CM | POA: Diagnosis not present

## 2021-05-26 DIAGNOSIS — F4310 Post-traumatic stress disorder, unspecified: Secondary | ICD-10-CM | POA: Diagnosis not present

## 2021-05-26 DIAGNOSIS — M549 Dorsalgia, unspecified: Secondary | ICD-10-CM | POA: Diagnosis not present

## 2021-05-26 DIAGNOSIS — I10 Essential (primary) hypertension: Secondary | ICD-10-CM | POA: Diagnosis not present

## 2021-05-26 DIAGNOSIS — E876 Hypokalemia: Secondary | ICD-10-CM | POA: Diagnosis not present

## 2021-05-26 DIAGNOSIS — Z79899 Other long term (current) drug therapy: Secondary | ICD-10-CM | POA: Diagnosis not present

## 2021-05-26 NOTE — ED Notes (Signed)
Pt discharged to [facility].  Pt is voluntary.  1 belongs bag sent with patient. Pt cooperative.

## 2021-05-26 NOTE — ED Provider Notes (Signed)
Emergency Medicine Observation Re-evaluation Note  Ivan Hamilton is a 55 y.o. male, seen on rounds today.  Pt initially presented to the ED for complaints of No chief complaint on file. Currently, the patient is resting.  Physical Exam  BP 124/78   Pulse 90   Temp 99 F (37.2 C) (Oral)   Resp 14   SpO2 99%  Physical Exam General: NAD Cardiac: well perfused Lungs: unlabored Psych: currently calm  ED Course / MDM  EKG:EKG Interpretation  Date/Time:  Thursday May 25 2021 09:33:09 EDT Ventricular Rate:  95 PR Interval:  146 QRS Duration: 82 QT Interval:  386 QTC Calculation: 485 R Axis:   45 Text Interpretation: Normal sinus rhythm Prolonged QT Abnormal ECG Confirmed by UNCONFIRMED, DOCTOR ([id number]), editor Mel Almond, Tammy [phone]) on 05/25/2021 10:45:07 AM  I have reviewed the labs performed to date as well as medications administered while in observation.  Recent changes in the last 24 hours include .  Plan  Current plan is for psych eval.  [name] is not under involuntary commitment.     Merlyn Lot, MD 05/26/21 [phone]

## 2021-05-26 NOTE — ED Notes (Signed)
Unable to obtain vitals due to patient sleeping. Will continue to monitor.   

## 2021-05-26 NOTE — ED Notes (Signed)
SAFE  TRANSPORT  CALLED  TO  TAKE  PT  TO  [place]

## 2021-05-27 DIAGNOSIS — F209 Schizophrenia, unspecified: Secondary | ICD-10-CM | POA: Diagnosis not present

## 2021-05-28 DIAGNOSIS — F209 Schizophrenia, unspecified: Secondary | ICD-10-CM | POA: Diagnosis not present

## 2021-05-29 DIAGNOSIS — F209 Schizophrenia, unspecified: Secondary | ICD-10-CM | POA: Diagnosis not present

## 2021-05-30 DIAGNOSIS — R45851 Suicidal ideations: Secondary | ICD-10-CM | POA: Diagnosis not present

## 2021-05-30 DIAGNOSIS — F1020 Alcohol dependence, uncomplicated: Secondary | ICD-10-CM | POA: Diagnosis not present

## 2021-05-30 DIAGNOSIS — I10 Essential (primary) hypertension: Secondary | ICD-10-CM | POA: Diagnosis not present

## 2021-05-30 DIAGNOSIS — F4310 Post-traumatic stress disorder, unspecified: Secondary | ICD-10-CM | POA: Diagnosis not present

## 2021-05-30 DIAGNOSIS — M549 Dorsalgia, unspecified: Secondary | ICD-10-CM | POA: Diagnosis not present

## 2021-05-30 DIAGNOSIS — K219 Gastro-esophageal reflux disease without esophagitis: Secondary | ICD-10-CM | POA: Diagnosis not present

## 2021-05-30 DIAGNOSIS — E039 Hypothyroidism, unspecified: Secondary | ICD-10-CM | POA: Diagnosis not present

## 2021-05-30 DIAGNOSIS — F315 Bipolar disorder, current episode depressed, severe, with psychotic features: Secondary | ICD-10-CM | POA: Diagnosis not present

## 2021-05-30 DIAGNOSIS — F209 Schizophrenia, unspecified: Secondary | ICD-10-CM | POA: Diagnosis not present

## 2021-08-11 DIAGNOSIS — F1024 Alcohol dependence with alcohol-induced mood disorder: Secondary | ICD-10-CM | POA: Diagnosis not present

## 2021-08-11 DIAGNOSIS — Z03818 Encounter for observation for suspected exposure to other biological agents ruled out: Secondary | ICD-10-CM | POA: Diagnosis not present

## 2021-08-11 DIAGNOSIS — J40 Bronchitis, not specified as acute or chronic: Secondary | ICD-10-CM | POA: Diagnosis not present

## 2021-08-11 DIAGNOSIS — F411 Generalized anxiety disorder: Secondary | ICD-10-CM | POA: Diagnosis not present

## 2021-08-16 DIAGNOSIS — H524 Presbyopia: Secondary | ICD-10-CM | POA: Diagnosis not present

## 2021-08-18 DIAGNOSIS — I83812 Varicose veins of left lower extremities with pain: Secondary | ICD-10-CM | POA: Diagnosis not present

## 2021-08-18 DIAGNOSIS — F339 Major depressive disorder, recurrent, unspecified: Secondary | ICD-10-CM | POA: Diagnosis not present

## 2021-08-18 DIAGNOSIS — F1020 Alcohol dependence, uncomplicated: Secondary | ICD-10-CM | POA: Diagnosis not present

## 2021-10-17 ENCOUNTER — Emergency Department
Admission: EM | Admit: 2021-10-17 | Discharge: 2021-10-18 | Disposition: A | Discharge status: Other Acute Inpatient Hospital | Payer: No Typology Code available for payment source | Attending: Emergency Medicine | Admitting: Emergency Medicine

## 2021-10-17 ENCOUNTER — Other Ambulatory Visit: Payer: Self-pay

## 2021-10-17 DIAGNOSIS — Z20822 Contact with and (suspected) exposure to covid-19: Secondary | ICD-10-CM | POA: Diagnosis not present

## 2021-10-17 DIAGNOSIS — R45851 Suicidal ideations: Secondary | ICD-10-CM | POA: Insufficient documentation

## 2021-10-17 DIAGNOSIS — F10239 Alcohol dependence with withdrawal, unspecified: Secondary | ICD-10-CM | POA: Diagnosis not present

## 2021-10-17 DIAGNOSIS — R4182 Altered mental status, unspecified: Secondary | ICD-10-CM | POA: Diagnosis present

## 2021-10-17 DIAGNOSIS — F10939 Alcohol use, unspecified with withdrawal, unspecified: Secondary | ICD-10-CM | POA: Diagnosis present

## 2021-10-17 DIAGNOSIS — Y908 Blood alcohol level of 240 mg/100 ml or more: Secondary | ICD-10-CM | POA: Diagnosis not present

## 2021-10-17 DIAGNOSIS — F29 Unspecified psychosis not due to a substance or known physiological condition: Secondary | ICD-10-CM | POA: Diagnosis not present

## 2021-10-17 DIAGNOSIS — F4312 Post-traumatic stress disorder, chronic: Secondary | ICD-10-CM | POA: Diagnosis not present

## 2021-10-17 LAB — URINE DRUG SCREEN, QUALITATIVE (ARMC ONLY)
Amphetamines, Ur Screen: NOT DETECTED
Barbiturates, Ur Screen: NOT DETECTED
Benzodiazepine, Ur Scrn: NOT DETECTED
Cannabinoid 50 Ng, Ur ~~LOC~~: NOT DETECTED
Cocaine Metabolite,Ur ~~LOC~~: NOT DETECTED
MDMA (Ecstasy)Ur Screen: NOT DETECTED
Methadone Scn, Ur: NOT DETECTED
Opiate, Ur Screen: NOT DETECTED
Phencyclidine (PCP) Ur S: NOT DETECTED
Tricyclic, Ur Screen: NOT DETECTED

## 2021-10-17 LAB — ACETAMINOPHEN LEVEL: Acetaminophen (Tylenol), Serum: <10 ug/mL — ABNORMAL LOW (ref 10–30)

## 2021-10-17 LAB — COMPREHENSIVE METABOLIC PANEL
ALT: 19 U/L (ref 0–44)
AST: 36 U/L (ref 15–41)
Albumin: 4.2 g/dL (ref 3.5–5.0)
Alkaline Phosphatase: 69 U/L (ref 38–126)
Anion gap: 9 (ref 5–15)
BUN: 10 mg/dL (ref 6–20)
CO2: 28 mmol/L (ref 22–32)
Calcium: 8.7 mg/dL — ABNORMAL LOW (ref 8.9–10.3)
Chloride: 105 mmol/L (ref 98–111)
Creatinine, Ser: 0.93 mg/dL (ref 0.61–1.24)
GFR, Estimated: >60 mL/min (ref >60)
Glucose, Bld: 91 mg/dL (ref 70–99)
Potassium: 4.3 mmol/L (ref 3.5–5.1)
Sodium: 142 mmol/L (ref 135–145)
Total Bilirubin: 0.8 mg/dL (ref 0.3–1.2)
Total Protein: 8.1 g/dL (ref 6.5–8.1)

## 2021-10-17 LAB — URINALYSIS, ROUTINE W REFLEX MICROSCOPIC
Bilirubin Urine: NEGATIVE
Glucose, UA: NEGATIVE mg/dL
Hgb urine dipstick: NEGATIVE
Ketones, ur: NEGATIVE mg/dL
Leukocytes,Ua: NEGATIVE
Nitrite: NEGATIVE
Protein, ur: NEGATIVE mg/dL
Specific Gravity, Urine: 1.006 (ref 1.005–1.030)
pH: 5 (ref 5.0–8.0)

## 2021-10-17 LAB — LACTIC ACID, PLASMA
Lactic Acid, Venous: 2.3 mmol/L (ref 0.5–1.9)
Lactic Acid, Venous: 3.2 mmol/L (ref 0.5–1.9)

## 2021-10-17 LAB — CBC WITH DIFFERENTIAL/PLATELET
Abs Immature Granulocytes: 0.04 10*3/uL (ref 0.00–0.07)
Basophils Absolute: 0.1 10*3/uL (ref 0.0–0.1)
Basophils Relative: 1 %
Eosinophils Absolute: 0.1 10*3/uL (ref 0.0–0.5)
Eosinophils Relative: 1 %
HCT: 46 % (ref 39.0–52.0)
Hemoglobin: 15.1 g/dL (ref 13.0–17.0)
Immature Granulocytes: 0 %
Lymphocytes Relative: 56 %
Lymphs Abs: 5.6 10*3/uL — ABNORMAL HIGH (ref 0.7–4.0)
MCH: 29.5 pg (ref 26.0–34.0)
MCHC: 32.8 g/dL (ref 30.0–36.0)
MCV: 90 fL (ref 80.0–100.0)
Monocytes Absolute: 0.6 10*3/uL (ref 0.1–1.0)
Monocytes Relative: 6 %
Neutro Abs: 3.7 10*3/uL (ref 1.7–7.7)
Neutrophils Relative %: 36 %
Platelets: 182 10*3/uL (ref 150–400)
RBC: 5.11 MIL/uL (ref 4.22–5.81)
RDW: 14.5 % (ref 11.5–15.5)
WBC: 10.1 10*3/uL (ref 4.0–10.5)
nRBC: 0 % (ref 0.0–0.2)

## 2021-10-17 LAB — RESP PANEL BY RT-PCR (FLU A&B, COVID) ARPGX2
Influenza A by PCR: NEGATIVE
Influenza B by PCR: NEGATIVE
SARS Coronavirus 2 by RT PCR: NEGATIVE

## 2021-10-17 LAB — TROPONIN I (HIGH SENSITIVITY)
Troponin I (High Sensitivity): 5 ng/L (ref <18)
Troponin I (High Sensitivity): 4 ng/L (ref <18)

## 2021-10-17 LAB — ETHANOL: Alcohol, Ethyl (B): 423 mg/dL (ref <10)

## 2021-10-17 LAB — SALICYLATE LEVEL: Salicylate Lvl: <7 mg/dL — ABNORMAL LOW (ref 7.0–30.0)

## 2021-10-17 MED ORDER — HALOPERIDOL LACTATE 5 MG/ML IJ SOLN
INTRAMUSCULAR | Status: AC
  Filled 2021-10-17: qty 1

## 2021-10-17 MED ORDER — LORAZEPAM 2 MG/ML IJ SOLN
INTRAMUSCULAR | Status: AC
Start: 2021-10-17 — End: 2021-10-18
  Filled 2021-10-17: qty 1

## 2021-10-17 MED ORDER — LORAZEPAM 2 MG/ML IJ SOLN
2.0000 mg | Freq: Once | INTRAMUSCULAR | Status: AC
  Administered 2021-10-17: 2 mg via INTRAVENOUS

## 2021-10-17 MED ORDER — THIAMINE HCL 100 MG/ML IJ SOLN
100.0000 mg | Freq: Every day | INTRAMUSCULAR | Status: DC
  Administered 2021-10-17 – 2021-10-18 (×2): 100 mg via INTRAVENOUS
  Filled 2021-10-17 (×2): qty 2

## 2021-10-17 MED ORDER — HALOPERIDOL LACTATE 5 MG/ML IJ SOLN
5.0000 mg | Freq: Once | INTRAMUSCULAR | Status: AC
  Administered 2021-10-17: 5 mg via INTRAVENOUS

## 2021-10-17 NOTE — ED Notes (Signed)
Pt placed on monitoring equipment, in view of nursing station with psych precautions ?
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
## 2021-10-17 NOTE — ED Provider Notes (Signed)
? ?[facility] ?Provider Note ? ? ? Event Date/Time  ? First MD Initiated Contact with Patient 10/17/21 1926   ?  (approximate) ? ? ?History  ? ?Altered Mental Status ? ? ?HPI ? ?Ivan Hamilton is a 56 y.o. male who comes in saying "I want to die I want to die," he then shows me his arm which has a number of superficial scratches on it as though he had cut himself recently trying to cut his wrist.  Patient has a history of psychotic depression.  Commitment papers that came with him says he is schizophrenic.  This medicine was changed recently and he apparently has gotten worse since been threatening his parents.  Patient has apparently been either drinking or using drugs or both. ? ?  ? ? ?Physical Exam  ? ?Triage Vital Signs: ?ED Triage Vitals  ?Enc Vitals Group  ?   BP 10/17/21 1916 120/87  ?   Pulse Rate 10/17/21 1916 78  ?   Resp 10/17/21 1916 16  ?   Temp 10/17/21 1916 97.6 ?F (36.4 ?C)  ?   Temp Source 10/17/21 1916 Oral  ?   SpO2 10/17/21 1916 96 %  ?   Weight 10/17/21 1917 173 lb (78.5 kg)  ?   Height 10/17/21 1917 '5\' 5"'$  (1.651 m)  ?   Head Circumference --   ?   Peak Flow --   ?   Pain Score 10/17/21 1916 0  ?   Pain Loc --   ?   Pain Edu? --   ?   Excl. in [place]? --   ? ? ?Most recent vital signs: ?Vitals:  ? 10/17/21 1916  ?BP: 120/87  ?Pulse: 78  ?Resp: 16  ?Temp: 97.6 ?F (36.4 ?C)  ?SpO2: 96%  ? ? ? ?General: Awake, no distress.  ?CV:  Good peripheral perfusion.  Heart regular rate and rhythm no audible murmurs ?Resp:  Normal effort.  Lungs are clear ?Abd:  No distention.  Soft nontender no organomegaly ?Extremities: No edema in the legs very superficial abrasions on the left wrist ? ? ?ED Results / Procedures / Treatments  ? ?Labs ?(all labs ordered are listed, but only abnormal results are displayed) ?Labs Reviewed  ?RESP PANEL BY RT-PCR (FLU A&B, COVID) ARPGX2  ?CBC WITH DIFFERENTIAL/PLATELET  ?COMPREHENSIVE METABOLIC PANEL  ?URINALYSIS, ROUTINE W REFLEX MICROSCOPIC  ?URINE DRUG  SCREEN, QUALITATIVE ([facility] ONLY)  ?ETHANOL  ?ACETAMINOPHEN LEVEL  ?SALICYLATE LEVEL  ?LACTIC ACID, PLASMA  ?LACTIC ACID, PLASMA  ?CBG MONITORING, ED  ?TROPONIN I (HIGH SENSITIVITY)  ? ? ? ?EKG ? ? ? ? ?RADIOLOGY ? ? ? ?PROCEDURES: ? ?Critical Care performed:  ? ?Procedures ? ? ?MEDICATIONS ORDERED IN ED: ?Medications  ?thiamine (B-1) injection 100 mg (has no administration in time range)  ?haloperidol lactate (HALDOL) 5 MG/ML injection (has no administration in time range)  ?LORazepam (ATIVAN) 2 MG/ML injection (has no administration in time range)  ?haloperidol lactate (HALDOL) injection 5 mg (5 mg Intravenous Given 10/17/21 1944)  ?LORazepam (ATIVAN) injection 2 mg (2 mg Intravenous Given 10/17/21 1944)  ? ? ? ?IMPRESSION / MDM / ASSESSMENT AND PLAN / ED COURSE  ?I reviewed the triage vital signs and the nursing notes. ?----------------------------------------- ?7:48 PM on 10/17/2021 ?----------------------------------------- ?Patient begins to get loud and threatening violence and begins to rip out his IV and sit up and try to leave.  We could not talk him down.  He had to be physically restrained by myself  and the nurse and then the police.  He was then given 2 of Haldol and 5 of Ativan IV and relax. ? ? ? ?  ? ? ?FINAL CLINICAL IMPRESSION(S) / ED DIAGNOSES  ? ?Final diagnoses:  ?Suicidal ideation  ? ? ? ?Rx / DC Orders  ? ?ED Discharge Orders   ? ? None  ? ?  ? ? ? ?Note:  This document was prepared using Dragon voice recognition software and may include unintentional dictation errors. ?  ?Nena Polio, MD ?10/17/21 1949 ? ?

## 2021-10-17 NOTE — ED Triage Notes (Signed)
Pt presents to ER via acems from home.  Ems called out for ams per family.  Pt has reported hx of PTSD per ems and has been drinking alcohol tonight and has verbalized SI to ems.  Pt has some superficial cuts to left forearm, and now states "I dont want to kill myself, I am just crazy."  Pt is A&O x4 at this time in NAD.   ?

## 2021-10-17 NOTE — ED Notes (Signed)
Pt BIBA with smell and admission of heavy ETOH use. Pt a/ox4, slurred speech and states he wants to die and shows this RN superficial lacs to left FA. Pt states he does not want to be here anymore and asks the ED staff if we will help him die. Pt denies other medical complaints.  ?

## 2021-10-17 NOTE — ED Notes (Signed)
Resumed care from [facility].  Pt in 19hallway   pt placed on 2 liters oxygen n/c.  Pt calm and cooperative.  Pt arousable, resting with eyes closed.    ?

## 2021-10-18 ENCOUNTER — Other Ambulatory Visit: Payer: Self-pay

## 2021-10-18 ENCOUNTER — Inpatient Hospital Stay
Admission: AD | Admit: 2021-10-18 | Discharge: 2021-10-24 | DRG: 882 | Disposition: A | Discharge status: Home / Self Care | Payer: No Typology Code available for payment source | Source: Intra-hospital | Attending: Psychiatry | Admitting: Psychiatry

## 2021-10-18 ENCOUNTER — Encounter: Payer: Self-pay | Admitting: Psychiatry

## 2021-10-18 DIAGNOSIS — Z8673 Personal history of transient ischemic attack (TIA), and cerebral infarction without residual deficits: Secondary | ICD-10-CM | POA: Diagnosis not present

## 2021-10-18 DIAGNOSIS — F10239 Alcohol dependence with withdrawal, unspecified: Secondary | ICD-10-CM | POA: Diagnosis present

## 2021-10-18 DIAGNOSIS — F1020 Alcohol dependence, uncomplicated: Secondary | ICD-10-CM | POA: Diagnosis present

## 2021-10-18 DIAGNOSIS — F4312 Post-traumatic stress disorder, chronic: Secondary | ICD-10-CM | POA: Diagnosis not present

## 2021-10-18 DIAGNOSIS — R45851 Suicidal ideations: Secondary | ICD-10-CM | POA: Diagnosis present

## 2021-10-18 DIAGNOSIS — E039 Hypothyroidism, unspecified: Secondary | ICD-10-CM | POA: Diagnosis present

## 2021-10-18 DIAGNOSIS — F10939 Alcohol use, unspecified with withdrawal, unspecified: Secondary | ICD-10-CM | POA: Diagnosis present

## 2021-10-18 DIAGNOSIS — G4700 Insomnia, unspecified: Secondary | ICD-10-CM | POA: Diagnosis present

## 2021-10-18 DIAGNOSIS — Z20822 Contact with and (suspected) exposure to covid-19: Secondary | ICD-10-CM | POA: Diagnosis present

## 2021-10-18 DIAGNOSIS — I10 Essential (primary) hypertension: Secondary | ICD-10-CM | POA: Diagnosis present

## 2021-10-18 DIAGNOSIS — K219 Gastro-esophageal reflux disease without esophagitis: Secondary | ICD-10-CM | POA: Diagnosis present

## 2021-10-18 DIAGNOSIS — Y908 Blood alcohol level of 240 mg/100 ml or more: Secondary | ICD-10-CM | POA: Diagnosis present

## 2021-10-18 DIAGNOSIS — Z9182 Personal history of military deployment: Secondary | ICD-10-CM | POA: Diagnosis present

## 2021-10-18 MED ORDER — LORAZEPAM 1 MG PO TABS
1.0000 mg | ORAL_TABLET | Freq: Every day | ORAL | Status: AC
  Administered 2021-10-22: 1 mg via ORAL
  Filled 2021-10-18: qty 1

## 2021-10-18 MED ORDER — THIAMINE HCL 100 MG PO TABS
100.0000 mg | ORAL_TABLET | Freq: Every day | ORAL | Status: DC
  Administered 2021-10-20 – 2021-10-24 (×5): 100 mg via ORAL
  Filled 2021-10-18 (×5): qty 1

## 2021-10-18 MED ORDER — ADULT MULTIVITAMIN W/MINERALS CH
1.0000 | ORAL_TABLET | Freq: Every day | ORAL | Status: DC
  Administered 2021-10-19 – 2021-10-24 (×6): 1 via ORAL
  Filled 2021-10-18 (×6): qty 1

## 2021-10-18 MED ORDER — ONDANSETRON 4 MG PO TBDP: 4.0000 mg | ORAL_TABLET | Freq: Four times a day (QID) | ORAL | Status: DC | PRN

## 2021-10-18 MED ORDER — LORAZEPAM 1 MG PO TABS: 1.0000 mg | ORAL_TABLET | Freq: Every day | ORAL | Status: DC

## 2021-10-18 MED ORDER — ONDANSETRON 4 MG PO TBDP: 4.0000 mg | ORAL_TABLET | Freq: Four times a day (QID) | ORAL | Status: AC | PRN

## 2021-10-18 MED ORDER — THIAMINE HCL 100 MG/ML IJ SOLN: 100.0000 mg | Freq: Once | INTRAMUSCULAR | Status: DC

## 2021-10-18 MED ORDER — LORAZEPAM 1 MG PO TABS
1.0000 mg | ORAL_TABLET | Freq: Two times a day (BID) | ORAL | Status: AC
Start: 2021-10-20 — End: 2021-10-21
  Administered 2021-10-20 – 2021-10-21 (×2): 1 mg via ORAL
  Filled 2021-10-18 (×2): qty 1

## 2021-10-18 MED ORDER — ALUM & MAG HYDROXIDE-SIMETH 200-200-20 MG/5ML PO SUSP: 30.0000 mL | ORAL | Status: DC | PRN

## 2021-10-18 MED ORDER — LORAZEPAM 1 MG PO TABS: 1.0000 mg | ORAL_TABLET | Freq: Two times a day (BID) | ORAL | Status: DC

## 2021-10-18 MED ORDER — THIAMINE HCL 100 MG/ML IJ SOLN
100.0000 mg | Freq: Once | INTRAMUSCULAR | Status: AC
  Administered 2021-10-19: 100 mg via INTRAMUSCULAR
  Filled 2021-10-18: qty 2

## 2021-10-18 MED ORDER — ACETAMINOPHEN 325 MG PO TABS
650.0000 mg | ORAL_TABLET | Freq: Four times a day (QID) | ORAL | Status: DC | PRN
  Administered 2021-10-20: 650 mg via ORAL
  Filled 2021-10-18: qty 2

## 2021-10-18 MED ORDER — LORAZEPAM 1 MG PO TABS
1.0000 mg | ORAL_TABLET | Freq: Four times a day (QID) | ORAL | Status: AC
  Administered 2021-10-18 – 2021-10-19 (×2): 1 mg via ORAL
  Filled 2021-10-18 (×2): qty 1

## 2021-10-18 MED ORDER — LOPERAMIDE HCL 2 MG PO CAPS: 2.0000 mg | ORAL_CAPSULE | ORAL | Status: AC | PRN

## 2021-10-18 MED ORDER — LORAZEPAM 1 MG PO TABS
1.0000 mg | ORAL_TABLET | Freq: Four times a day (QID) | ORAL | Status: DC
  Administered 2021-10-18 (×3): 1 mg via ORAL
  Filled 2021-10-18 (×3): qty 1

## 2021-10-18 MED ORDER — LORAZEPAM 1 MG PO TABS
1.0000 mg | ORAL_TABLET | Freq: Three times a day (TID) | ORAL | Status: AC
  Administered 2021-10-19 – 2021-10-20 (×3): 1 mg via ORAL
  Filled 2021-10-18 (×3): qty 1

## 2021-10-18 MED ORDER — LOPERAMIDE HCL 2 MG PO CAPS: 2.0000 mg | ORAL_CAPSULE | ORAL | Status: DC | PRN

## 2021-10-18 MED ORDER — LORAZEPAM 1 MG PO TABS: 1.0000 mg | ORAL_TABLET | Freq: Four times a day (QID) | ORAL | Status: DC | PRN

## 2021-10-18 MED ORDER — LORAZEPAM 1 MG PO TABS
1.0000 mg | ORAL_TABLET | Freq: Four times a day (QID) | ORAL | Status: AC | PRN
  Administered 2021-10-21: 1 mg via ORAL
  Filled 2021-10-18 (×2): qty 1

## 2021-10-18 MED ORDER — HYDROXYZINE HCL 25 MG PO TABS: 25.0000 mg | ORAL_TABLET | Freq: Four times a day (QID) | ORAL | Status: DC | PRN

## 2021-10-18 MED ORDER — LORAZEPAM 1 MG PO TABS: 1.0000 mg | ORAL_TABLET | Freq: Three times a day (TID) | ORAL | Status: DC

## 2021-10-18 MED ORDER — IBUPROFEN 800 MG PO TABS
800.0000 mg | ORAL_TABLET | ORAL | Status: AC
  Administered 2021-10-18: 800 mg via ORAL
  Filled 2021-10-18: qty 1

## 2021-10-18 MED ORDER — ADULT MULTIVITAMIN W/MINERALS CH: 1.0000 | ORAL_TABLET | Freq: Every day | ORAL | Status: DC

## 2021-10-18 MED ORDER — THIAMINE HCL 100 MG PO TABS: 100.0000 mg | ORAL_TABLET | Freq: Every day | ORAL | Status: DC

## 2021-10-18 MED ORDER — MAGNESIUM HYDROXIDE 400 MG/5ML PO SUSP: 30.0000 mL | Freq: Every day | ORAL | Status: DC | PRN

## 2021-10-18 MED ORDER — HYDROXYZINE HCL 25 MG PO TABS
25.0000 mg | ORAL_TABLET | Freq: Four times a day (QID) | ORAL | Status: AC | PRN
  Administered 2021-10-20: 25 mg via ORAL
  Filled 2021-10-18: qty 1

## 2021-10-18 NOTE — ED Notes (Signed)
Resumed care from [facility] rn.  Pt in hallway bed   pt eating lunch meal  pt calm and cooperative.  Pt waiting on admission.  ?

## 2021-10-18 NOTE — Consult Note (Signed)
[facility] Face-to-Face Psychiatry Consult  ? ?Reason for Consult:  depression/alcoholism ?Referring Physician:  EDP ?Patient Identification: Ivan Hamilton ?MRN:  [id number] ?Principal Diagnosis: Chronic post-traumatic stress disorder (PTSD) ?Diagnosis:  Principal Problem: ?  Chronic post-traumatic stress disorder (PTSD) ?Active Problems: ?  Alcohol withdrawal ([place]) ? ? ?Total Time spent with patient: 45 minutes ? ?Subjective: "I need help"  ?Ivan Hamilton is a 56 y.o. male patient admitted with alcohol intoxication. Patient's family called EMS because patient voiced suicidal thoughts.  ? ?HPI:  Patient was seen and chart reviewed. Patient has psychiatric history of chronic PTSD and alcohol abuse. During this assessment patient is calm, pleasant, visibly sad, to which he admits. Patient reports to this provider that he has not had alcohol for 2 months, since leaving [employer], but he started drinking again yesterday. He reports leaving the treatment center only because they did not take his insurance.  Patient states he and his wife are divorced but they still live together. He reports no particular trigger that happened yesterday. He states that he does have depression and does not always take his medications. He denies suicidal thoughts, but when Probation officer asked about what his daughter told me about him saying he would "drink himself to death" he admitted that he feels this way and probably said that. He reports that he sometimes has auditory and visual hallucinations although denies currently. He does not remember coming to the hospital last night or the events that took place. He denies illicit drug use. BAL 423 '@1724'$  on 10/17/21 (ED admission).  ? ?Collateral from daughter, Ivan Hamilton, [phone]: She states that she is very concerned about her father, who she reports has said "I am just going to die by drinking myself to death."  She states that he has been very depressed and they do not  know how to help.   ? ?Past Psychiatric History: PTSD; alcohol abuse; MDD ? ?Risk to Self:   ?Risk to Others:   ?Prior Inpatient Therapy:   ?Prior Outpatient Therapy:   ? ?Past Medical History:  ?Past Medical History:  ?Diagnosis Date  ? Alcohol withdrawal ([place])   ? B12 deficiency   ? Chronic pain   ? CVA (cerebral vascular accident) [facility])   ? Hypertension   ? Hypothyroidism   ? Varicose veins   ?  ?Past Surgical History:  ?Procedure Laterality Date  ? CHOLECYSTECTOMY    ? COLONOSCOPY WITH PROPOFOL N/A 03/22/2021  ? Procedure: COLONOSCOPY WITH PROPOFOL;  Surgeon: Toledo, Benay Pike, MD;  Location: [facility] ENDOSCOPY;  Service: Gastroenterology;  Laterality: N/A;  SPANISH INTERPRETER  ? ESOPHAGOGASTRODUODENOSCOPY N/A 03/22/2021  ? Procedure: ESOPHAGOGASTRODUODENOSCOPY (EGD);  Surgeon: Toledo, Benay Pike, MD;  Location: [facility] ENDOSCOPY;  Service: Gastroenterology;  Laterality: N/A;  ? ESOPHAGOGASTRODUODENOSCOPY (EGD) WITH PROPOFOL N/A 10/24/2017  ? Procedure: ESOPHAGOGASTRODUODENOSCOPY (EGD) WITH PROPOFOL;  Surgeon: [name], MD;  Location: [facility] ENDOSCOPY;  Service: Endoscopy;  Laterality: N/A;  ? ESOPHAGOGASTRODUODENOSCOPY (EGD) WITH PROPOFOL N/A 07/12/2019  ? Procedure: ESOPHAGOGASTRODUODENOSCOPY (EGD) WITH PROPOFOL;  Surgeon: Jonathon Bellows, MD;  Location: [facility] ENDOSCOPY;  Service: Gastroenterology;  Laterality: N/A;  ? LEG SURGERY    ? ?Family History: History reviewed. No pertinent family history. ?Family Psychiatric  History: none reported ?Social History:  ?Social History  ? ?Substance and Sexual Activity  ?Alcohol Use Yes  ? Alcohol/week: 21.0 standard drinks  ? Types: 21 Shots of liquor per week  ? Comment: daily- 1 pint of vodka  ?   ?Social History  ? ?Substance and  Sexual Activity  ?Drug Use No  ?  ?Social History  ? ?Socioeconomic History  ? Marital status: Married  ?  Spouse name: Not on file  ? Number of children: Not on file  ? Years of education: Not on file  ? Highest education level: Not on file   ?Occupational History  ? Not on file  ?Tobacco Use  ? Smoking status: Never  ? Smokeless tobacco: Never  ?Vaping Use  ? Vaping Use: Never used  ?Substance and Sexual Activity  ? Alcohol use: Yes  ?  Alcohol/week: 21.0 standard drinks  ?  Types: 21 Shots of liquor per week  ?  Comment: daily- 1 pint of vodka  ? Drug use: No  ? Sexual activity: Not on file  ?Other Topics Concern  ? Not on file  ?Social History Narrative  ? Not on file  ? ?Social Determinants of Health  ? ?Financial Resource Strain: Not on file  ?Food Insecurity: Not on file  ?Transportation Needs: Not on file  ?Physical Activity: Not on file  ?Stress: Not on file  ?Social Connections: Not on file  ? ?Additional Social History: ?  ? ?Allergies:  No Known Allergies ? ?Labs:  ?Results for orders placed or performed during the hospital encounter of 10/17/21 (from the past 48 hour(s))  ?Comprehensive metabolic panel     Status: Abnormal  ? Collection Time: 10/17/21  7:24 PM  ?Result Value Ref Range  ? Sodium 142 135 - 145 mmol/L  ? Potassium 4.3 3.5 - 5.1 mmol/L  ? Chloride 105 98 - 111 mmol/L  ? CO2 28 22 - 32 mmol/L  ? Glucose, Bld 91 70 - 99 mg/dL  ?  Comment: Glucose reference range applies only to samples taken after fasting for at least 8 hours.  ? BUN 10 6 - 20 mg/dL  ? Creatinine, Ser 0.93 0.61 - 1.24 mg/dL  ? Calcium 8.7 (L) 8.9 - 10.3 mg/dL  ? Total Protein 8.1 6.5 - 8.1 g/dL  ? Albumin 4.2 3.5 - 5.0 g/dL  ? AST 36 15 - 41 U/L  ? ALT 19 0 - 44 U/L  ? Alkaline Phosphatase 69 38 - 126 U/L  ? Total Bilirubin 0.8 0.3 - 1.2 mg/dL  ? GFR, Estimated >60 >60 mL/min  ?  Comment: (NOTE) ?Calculated using the CKD-EPI Creatinine Equation (2021) ?  ? Anion gap 9 5 - 15  ?  Comment: Performed at [facility], [address], [place],  [address]  ?CBC with Differential     Status: Abnormal  ? Collection Time: 10/17/21  7:24 PM  ?Result Value Ref Range  ? WBC 10.1 4.0 - 10.5 K/uL  ? RBC 5.11 4.22 - 5.81 MIL/uL  ? Hemoglobin 15.1 13.0 - 17.0  g/dL  ? HCT 46.0 39.0 - 52.0 %  ? MCV 90.0 80.0 - 100.0 fL  ? MCH 29.5 26.0 - 34.0 pg  ? MCHC 32.8 30.0 - 36.0 g/dL  ? RDW 14.5 11.5 - 15.5 %  ? Platelets 182 150 - 400 K/uL  ? nRBC 0.0 0.0 - 0.2 %  ? Neutrophils Relative % 36 %  ? Neutro Abs 3.7 1.7 - 7.7 K/uL  ? Lymphocytes Relative 56 %  ? Lymphs Abs 5.6 (H) 0.7 - 4.0 K/uL  ? Monocytes Relative 6 %  ? Monocytes Absolute 0.6 0.1 - 1.0 K/uL  ? Eosinophils Relative 1 %  ? Eosinophils Absolute 0.1 0.0 - 0.5 K/uL  ? Basophils Relative 1 %  ?  Basophils Absolute 0.1 0.0 - 0.1 K/uL  ? WBC Morphology MORPHOLOGY UNREMARKABLE   ? RBC Morphology MORPHOLOGY UNREMARKABLE   ? Smear Review MORPHOLOGY UNREMARKABLE   ? Immature Granulocytes 0 %  ? Abs Immature Granulocytes 0.04 0.00 - 0.07 K/uL  ?  Comment: Performed at [facility], [address], [place], [place] [address]  ?Urinalysis, Routine w reflex microscopic Urine, Clean Catch     Status: Abnormal  ? Collection Time: 10/17/21  7:24 PM  ?Result Value Ref Range  ? Color, Urine STRAW (A) YELLOW  ? APPearance CLEAR (A) CLEAR  ? Specific Gravity, Urine 1.006 1.005 - 1.030  ? pH 5.0 5.0 - 8.0  ? Glucose, UA NEGATIVE NEGATIVE mg/dL  ? Hgb urine dipstick NEGATIVE NEGATIVE  ? Bilirubin Urine NEGATIVE NEGATIVE  ? Ketones, ur NEGATIVE NEGATIVE mg/dL  ? Protein, ur NEGATIVE NEGATIVE mg/dL  ? Nitrite NEGATIVE NEGATIVE  ? Leukocytes,Ua NEGATIVE NEGATIVE  ?  Comment: Performed at [facility], [address] [place], [place] [address]  ?Urine Drug Screen, Qualitative     Status: None  ? Collection Time: 10/17/21  7:24 PM  ?Result Value Ref Range  ? Tricyclic, Ur Screen NONE DETECTED NONE DETECTED  ? Amphetamines, Ur Screen NONE DETECTED NONE DETECTED  ? MDMA (Ecstasy)Ur Screen NONE DETECTED NONE DETECTED  ? Cocaine Metabolite,Ur [place] NONE DETECTED NONE DETECTED  ? Opiate, Ur Screen NONE DETECTED NONE DETECTED  ? Phencyclidine (PCP) Ur S NONE DETECTED NONE DETECTED  ? Cannabinoid 50 Ng, Ur [place] NONE DETECTED NONE  DETECTED  ? Barbiturates, Ur Screen NONE DETECTED NONE DETECTED  ? Benzodiazepine, Ur Scrn NONE DETECTED NONE DETECTED  ? Methadone Scn, Ur NONE DETECTED NONE DETECTED  ?  Comment: (NOTE) ?Tricyclics + metabolites, urine    C

## 2021-10-18 NOTE — ED Notes (Signed)
Pt sleeping, waiting on admission.   ?

## 2021-10-18 NOTE — ED Notes (Signed)
Pt is now CAOx4 and in no acute distress. Pt denies SI at this time. He states he doesn't remember a lot about last night as far as him being aggressive to staff. At this moment in time he advised he will do whatever the MD tells him to do.  ?

## 2021-10-18 NOTE — BH Assessment (Signed)
Patient is to be admitted to [facility] BMU tonight 10/18/21 after 8:00pm by Dr. Weber [place]s.  ?Attending Physician will be Dr.  Weber [place]s .   ?Patient has been assigned to room 322, by [place], Junita Push.   ?  ?ER staff is aware of the admission: ?Lattie Haw, ER Secretary   ?Dr. Cinda Quest, ER MD  ?Amy, Patient's Nurse  ?Seth Bake, Patient Access.  ?

## 2021-10-18 NOTE — ED Notes (Signed)
IVC  PT  GOING  TO  [facility]  TONIGHT ?

## 2021-10-18 NOTE — ED Notes (Signed)
Nightshift did not order diet for patient. Ordered now by this RN.  ?

## 2021-10-18 NOTE — ED Notes (Signed)
Report given to [facility], in BMU ?

## 2021-10-18 NOTE — ED Notes (Signed)
Pt ambulated to toilet and back to bed with no assistance. 

## 2021-10-18 NOTE — ED Provider Notes (Signed)
Emergency Medicine Observation Re-evaluation Note ? ?Ivan Hamilton is a 56 y.o. male, seen on rounds today.  Pt initially presented to the ED for complaints of Altered Mental Status ?Currently, the patient is resting, voices no medical complaint. ? ?Physical Exam  ?BP 105/69 (BP Location: Right Arm)   Pulse 87   Temp 98.1 ?F (36.7 ?C) (Oral)   Resp 18   Ht '5\' 5"'$  (1.651 m)   Wt 78.5 kg   SpO2 95%   BMI 28.79 kg/m?  ?Physical Exam ?General: Resting in no acute distress ?Cardiac: No cyanosis ?Lungs: Equal rise and fall ?Psych: Not agitated ? ?ED Course / MDM  ?EKG:  ? ?I have reviewed the labs performed to date as well as medications administered while in observation.  Recent changes in the last 24 hours include no events overnight.  Patient currently awake and able to participate in interview; will consult psychiatry and TTS. ? ?Plan  ?Current plan is for psychiatric disposition. ?Ivan Hamilton is under involuntary commitment. ?  ? ?  ?Paulette Blanch, MD ?10/18/21 0505 ? ?

## 2021-10-18 NOTE — ED Provider Notes (Signed)
?  10/17/2021 at 1940 hrs ? ?Behavioral Restraint Provider Note: ? ?Behavioral Indicators: ? ? ?Please see note from yesterday's H&P that I dictated this information into patient was behaving dangerously and could not be talked down ?He was violent and resisting threatening to staff ?Reaction to intervention: ?Patient could not be talked down we had to hold him down and give him medication as he was threatening [place] and threatened to leave and he was in no shape to leave he was confused and combative ? ? ? ? ?Review of systems: ? ?Patient had to be held given meds and his IV Haldol and Ativan ? ? ?History: ?Patient was intoxicated with an alcohol level but later returned at 435 ? ? ?Mental Status Exam: ? ?Patient was alert but confused and combative as noted above ? ? ? ? ? ?  ?Nena Polio, MD ?10/18/21 1518 ? ?

## 2021-10-18 NOTE — Plan of Care (Signed)
Patient new to the unit tonight, hasn't had time to progress ? ?Problem: Education: ?Goal: Knowledge of [place] [facility] Education information/materials will improve ?Outcome: Not Progressing ?Goal: Emotional status will improve ?Outcome: Not Progressing ?Goal: Mental status will improve ?Outcome: Not Progressing ?Goal: Verbalization of understanding the information provided will improve ?Outcome: Not Progressing ?  ?Problem: Safety: ?Goal: Periods of time without injury will increase ?Outcome: Not Progressing ?  ?Problem: Education: ?Goal: Utilization of techniques to improve thought processes will improve ?Outcome: Not Progressing ?Goal: Knowledge of the prescribed therapeutic regimen will improve ?Outcome: Not Progressing ?  ?Problem: Safety: ?Goal: Ability to disclose and discuss suicidal ideas will improve ?Outcome: Not Progressing ?Goal: Ability to identify and utilize support systems that promote safety will improve ?Outcome: Not Progressing ?  ?

## 2021-10-18 NOTE — BH Assessment (Addendum)
Comprehensive Clinical Assessment (CCA) Note ? ?10/18/2021 ?Ivan Hamilton ?[id number] ?Recommendations for Services/Supports/Treatments: Pt pending psych consult/disposition. ? ?Ivan Hamilton is a 56 year old, bilingual, Hispanic male with a history of alcohol use disorder, severe, PTSD, and MDD. Pt presented to [facility] ED by EMS initially for alcohol intoxication and due to the family's concerns about the pt endorsing SI. Upon assessment, pt explained that he had no memory of anything he'd said or done. Pt reported that he drinks 1/5TH of liquor, daily. The pt denied all other substance use. The pt expressed a desire for detox/substance abuse treatment due to his severe alcohol abuse. The pt reported experiencing extreme anxiety and tremors as withdrawal symptoms. The pt endorsed symptoms of depression when under the influence, as well as when he doesn't consume alcohol. Pt also reported having sleep disturbance due to having nightmares. Pt endorsed having auditory hallucinations that involve hearing his name. Pt explained that his appetite is poor. Pt reported that he continues to struggle with PTSD flashbacks, explaining that he does not have any medications at this time. Pt reported that he goes to the [facility] and his next appointment in April 11th. Pt reported that he lives with family who are supportive. Pt was pleasant and cooperative throughout the assessment, despite being belligerent upon arrival. Pt did not present with delusional thinking. Pt did not appear to be responding to internal/external stimuli. Pt was oriented x4. Pt presented with an appropriate mood; affect was responsive. Pt's BAL was 433. UDS was unremarkable. Pt denied current SI/HI/V/H.  ? ?Chief Complaint:  ?Chief Complaint  ?Patient presents with  ? Altered Mental Status  ? ?Visit Diagnosis: 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[place]) ?DDD (degenerative disc  disease), lumbosacral ?  Alcohol withdrawal ([place]) ?  Alcohol use disorder, severe, dependence ([place]) ?  Alcohol withdrawal delirium ([place]) ?  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  Confusion state ?  Pain of lumbar spine ?  Anxiety and depression ?  Severe recurrent major depressive disorder with psychotic features ([place]) ?  Alcohol intoxication ([place]) ?  Alcohol abuse ?  Tobacco abuse ?  Major psychotic depression, recurrent ([place]) ?  Severe major depression without psychotic features ([place]) ?  Generalized anxiety disorder  ? ? ?CCA Screening, Triage and Referral (STR) ? ?Patient Reported Information ?How did you hear about [place]? Family/Friend ? ?Referral name: No data recorded ?Referral phone number: No data recorded ? ?Whom do you see for routine medical problems? -- (Unknown) ? ?Practice/Facility Name: No data recorded ?Practice/Facility Phone Number: No data recorded ?Name of Contact: No data recorded ?Contact Number: No data recorded ?Contact Fax Number: No data recorded ?Prescriber Name: No data recorded ?Prescriber Address (if known): No data recorded ? ?What Is the Reason for Your Visit/Call Today? Alcohol intoxication; SI ? ?How Long Has This Been Causing You Problems? > than 6 months ? ?What Do You Feel Would Help You the Most Today? Alcohol or Drug Use Treatment; Treatment for Depression or other mood problem ? ? ?Have You Recently Been in Any Inpatient Treatment (Hospital/Detox/Crisis Center/28-Day Program)? Yes ? ?Name/Location of Program/Hospital:[facility] ? ?How Long Were You There? 7 days ? ?When Were You Discharged? 05/18/20 ? ? ?Have You Ever Received Services From [facility] Before? Yes ? ?Who Do You See at [facility]? Mental Health & Medical Treatment ? ? ?Have You Recently Had Any Thoughts About Hurting Yourself? Yes ? ?Are You Planning to Commit Suicide/Harm Yourself At This time? No ? ? ?  Have you Recently Had Thoughts About [place]? No ? ?Explanation: No data recorded ? ?Have You Used Any  Alcohol or Drugs in the Past 24 Hours? Yes ? ?How Long Ago Did You Use Drugs or Alcohol? No data recorded ?What Did You Use and How Much? Alcohol; unknown amount ? ? ?Do You Currently Have a Therapist/Psychiatrist? Yes ? ?Name of Therapist/Psychiatrist: [facility] ? ? ?Have You Been Recently Discharged From Any Office Practice or Programs? No ? ?Explanation of Discharge From Practice/Program: Patient reports his insurance would only pay for him to stay for 18 days. ? ? ?  ?CCA Screening Triage Referral Assessment ?Type of Contact: Face-to-Face ? ?Is this Initial or Reassessment? No data recorded ?Date Telepsych consult ordered in CHL:  No data recorded ?Time Telepsych consult ordered in CHL:  No data recorded ? ?Patient Reported Information Reviewed? Yes ? ?Patient Left Without Being Seen? No data recorded ?Reason for Not Completing Assessment: No data recorded ? ?Collateral Involvement: None provided ? ? ?Does Patient Have a Stage manager Guardian? No data recorded ?Name and Contact of Legal Guardian: No data recorded ?If Minor and Not Living with Parent(s), Who has Custody? n/a ? ?Is CPS involved or ever been involved? Never ? ?Is APS involved or ever been involved? Never ? ? ?Patient Determined To Be At Risk for Harm To Self or Others Based on Review of Patient Reported Information or Presenting Complaint? Yes, for Self-Harm ? ?Method: No data recorded ?Availability of Means: No data recorded ?Intent: No data recorded ?Notification Required: No data recorded ?Additional Information for Danger to Others Potential: No data recorded ?Additional Comments for Danger to Others Potential: No data recorded ?Are There Guns or Other Weapons in [place]? No data recorded ?Types of Guns/Weapons: No data recorded ?Are These Weapons Safely Secured?                            No data recorded ?Who Could Verify You Are Able To Have These Secured: No data recorded ?Do You Have any Outstanding Charges, Pending Court  Dates, Parole/Probation? No data recorded ?Contacted To Inform of Risk of Harm To Self or Others: Law Enforcement ? ? ?Location of Assessment: [facility] ED ? ? ?Does Patient Present under Involuntary Commitment? Yes ? ?IVC Papers Initial File Date: 10/17/21 ? ? ?[place] of Residence: [place] ? ? ?Patient Currently Receiving the Following Services: Medication Management ? ? ?Determination of Need: Emergent (2 hours) ? ? ?Options For Referral: ED Visit; Therapeutic Triage Services ? ? ? ? ?CCA Biopsychosocial ?Intake/Chief Complaint:  Patient presented to ER with suicidal ideations and alcohol intoxication ? ?Current Symptoms/Problems: Patient reports having PTSD, depression and has been drinking. ? ? ?Patient Reported Schizophrenia/Schizoaffective Diagnosis in Past: No ? ? ?Strengths: unknown ? ?Preferences: unknown ? ?Abilities: unknown ? ? ?Type of Services Patient Feels are Needed: IP detox or treatment ? ? ?Initial Clinical Notes/Concerns: No data recorded ? ?Mental Health Symptoms ?Depression:   ?Hopelessness; Sleep (too much or little); Increase/decrease in appetite ?  ?Duration of Depressive symptoms:  ?Greater than two weeks ?  ?Mania:   ?None ?  ?Anxiety:    ?Tension; Worrying ?  ?Psychosis:   ?Hallucinations ?  ?Duration of Psychotic symptoms:  ?Greater than six months ?  ?Trauma:   ?Re-experience of traumatic event; Emotional numbing; Difficulty staying/falling asleep; Detachment from others; Avoids reminders of event; Hypervigilance ?  ?Obsessions:   ?Cause anxiety; Disrupts routine/functioning;  Good insight; Intrusive/time consuming; Recurrent & persistent thoughts/impulses/images; Attempts to suppress/neutralize ?  ?Compulsions:   ?"Driven" to perform behaviors/acts; Intended to reduce stress or prevent another outcome; Intrusive/time consuming; Good insight; Disrupts with routine/functioning; Repeated behaviors/mental acts ?  ?Inattention:   ?N/A ?  ?Hyperactivity/Impulsivity:   ?None ?   ?Oppositional/Defiant Behaviors:   ?None ?  ?Emotional Irregularity:   ?Potentially harmful impulsivity; Recurrent suicidal behaviors/gestures/threats ?  ?Other Mood/Personality Symptoms:  No data recorded  ? ?Mental Status Exam ?

## 2021-10-18 NOTE — ED Notes (Signed)
Pt complaint of headache MD made aware.  ?

## 2021-10-18 NOTE — ED Notes (Signed)
Pt given lunch tray.

## 2021-10-18 NOTE — ED Notes (Signed)
IVC pending psychiatry and TTS consult  ?

## 2021-10-18 NOTE — Tx Team (Signed)
Initial Treatment Plan ?10/18/2021 ?9:43 PM ?Ivan Hamilton ?[id number] ? ? ? ?PATIENT STRESSORS: ?Marital or family conflict   ?Substance abuse   ? ? ?PATIENT STRENGTHS: ?Ability for insight  ?Motivation for treatment/growth  ? ? ?PATIENT IDENTIFIED PROBLEMS: ?Depression  ?Anxiety  ?Alcohol Abuse  ?  ?  ?  ?  ?  ?  ?  ? ?DISCHARGE CRITERIA:  ?Improved stabilization in mood, thinking, and/or behavior ?Verbal commitment to aftercare and medication compliance ? ?PRELIMINARY DISCHARGE PLAN: ?Outpatient therapy ?Return to previous living arrangement ? ?PATIENT/FAMILY INVOLVEMENT: ?This treatment plan has been presented to and reviewed with the patient, Ivan Hamilton. The patient has been given the opportunity to ask questions and make suggestions. ? ?Mallie Darting, RN ?10/18/2021, 9:43 PM ?

## 2021-10-18 NOTE — ED Notes (Signed)
Daughter Minus Breeding called to check on pt. She advised she is concerned about the increasing abdomen distention in the patient. He does have HX of cirrhosis. Will make MD aware.  ?

## 2021-10-18 NOTE — ED Notes (Signed)
Pt given dinner tray.

## 2021-10-19 DIAGNOSIS — F4312 Post-traumatic stress disorder, chronic: Secondary | ICD-10-CM | POA: Diagnosis not present

## 2021-10-19 MED ORDER — PRAZOSIN HCL 1 MG PO CAPS
1.0000 mg | ORAL_CAPSULE | Freq: Every day | ORAL | Status: DC
  Administered 2021-10-19 – 2021-10-20 (×2): 1 mg via ORAL
  Filled 2021-10-19 (×2): qty 1

## 2021-10-19 MED ORDER — FLUOXETINE HCL 20 MG PO CAPS
20.0000 mg | ORAL_CAPSULE | Freq: Every day | ORAL | Status: DC
Start: 2021-10-19 — End: 2021-10-21
  Administered 2021-10-19 – 2021-10-21 (×3): 20 mg via ORAL
  Filled 2021-10-19 (×3): qty 1

## 2021-10-19 MED ORDER — GABAPENTIN 400 MG PO CAPS
800.0000 mg | ORAL_CAPSULE | Freq: Three times a day (TID) | ORAL | Status: DC
  Administered 2021-10-19 – 2021-10-24 (×16): 800 mg via ORAL
  Filled 2021-10-19 (×16): qty 2

## 2021-10-19 MED ORDER — LEVOTHYROXINE SODIUM 50 MCG PO TABS
125.0000 ug | ORAL_TABLET | Freq: Every day | ORAL | Status: DC
  Administered 2021-10-20 – 2021-10-24 (×5): 125 ug via ORAL
  Filled 2021-10-19 (×6): qty 1

## 2021-10-19 MED ORDER — PANTOPRAZOLE SODIUM 40 MG PO TBEC
40.0000 mg | DELAYED_RELEASE_TABLET | Freq: Every day | ORAL | Status: DC
Start: 2021-10-19 — End: 2021-10-24
  Administered 2021-10-19 – 2021-10-24 (×6): 40 mg via ORAL
  Filled 2021-10-19 (×6): qty 1

## 2021-10-19 MED ORDER — BUSPIRONE HCL 5 MG PO TABS
10.0000 mg | ORAL_TABLET | Freq: Three times a day (TID) | ORAL | Status: DC
Start: 2021-10-19 — End: 2021-10-24
  Administered 2021-10-19 – 2021-10-24 (×16): 10 mg via ORAL
  Filled 2021-10-19 (×16): qty 2

## 2021-10-19 MED ORDER — LISINOPRIL 5 MG PO TABS
5.0000 mg | ORAL_TABLET | Freq: Every day | ORAL | Status: DC
  Administered 2021-10-19 – 2021-10-24 (×6): 5 mg via ORAL
  Filled 2021-10-19 (×6): qty 1

## 2021-10-19 NOTE — Progress Notes (Signed)
Recreation Therapy Notes ? ?Date: 10/19/2021 ? ?Time: 10:50 am  ? ?Location: Courtyard   ? ?Behavioral response: N/A ?  ?Intervention Topic: Relaxation   ? ?Discussion/Intervention: ?Patient refused to attend group.  ? ?Clinical Observations/Feedback:  ?Patient refused to attend group.  ?  ?[name] LRT/CTRS ? ? ? ? ? ? ? ? ?Ivan Hamilton ?10/19/2021 12:41 PM ?

## 2021-10-19 NOTE — BHH Suicide Risk Assessment (Signed)
[facility] Admission Suicide Risk Assessment ? ? ?Nursing information obtained from:  Patient ?Demographic factors:  NA ?Current Mental Status:  NA ?Loss Factors:  NA ?Historical Factors:  NA ?Risk Reduction Factors:  NA ? 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  Hypothyroid ?  Alcohol withdrawal ([place]) ?  Alcohol use disorder, severe, dependence ([place]) ? ?Subjective Data: Patient seen.  Patient well known to psychiatric service.  56 year old man with longstanding problem with alcohol abuse and PTSD.  Patient came into the emergency room intoxicated once again having voiced suicidal ideation.  On evaluation today he is no longer intoxicated he is cooperative and insightful.  He denies any suicidal thought or intent.  Not having any psychotic or withdrawal symptoms.  Able to appropriately discuss treatment ? ?Continued Clinical Symptoms:  ?Alcohol Use Disorder Identification Test Final Score (AUDIT): 6 ?The "Alcohol Use Disorders Identification Test", Guidelines for Use in Primary Care, Second Edition.  World Pharmacologist [facility]). ?Score between 0-7:  no or low risk or alcohol related problems. ?Score between 8-15:  moderate risk of alcohol related problems. ?Score between 16-19:  high risk of alcohol related problems. ?Score 20 or above:  warrants further diagnostic evaluation for alcohol dependence and treatment. ? ? ?CLINICAL FACTORS:  ? Severe Anxiety and/or Agitation ?Depression:   Comorbid alcohol abuse/dependence ?Dysthymia ?Alcohol/Substance Abuse/Dependencies ? ? ?Musculoskeletal: ?Strength & Muscle Tone: within normal limits ?Gait & Station: normal ?Patient leans: N/A ? ?Psychiatric Specialty Exam: ? ?Presentation  ?General Appearance: Appropriate for Environment ? ?Eye Contact:Good ? ?Speech:Clear and  Coherent ? ?Speech Volume:Normal ? ?Handedness:Right ? ? ?Mood and Affect  ?Mood:Depressed; Hopeless ? ?Affect:Congruent ? ? ?Thought Process  ?Thought Processes:Coherent ? ?Descriptions of Associations:Intact ? ?Orientation:Full (Time, Place and Person) ? ?Thought Content:Logical ? ?History of Schizophrenia/Schizoaffective disorder:No ? ?Duration of Psychotic Symptoms:Greater than six months ? ?Hallucinations:Hallucinations: -- (none currently) ? ?Ideas of Reference:None ? ?Suicidal Thoughts:Suicidal Thoughts: Yes, Passive ? ?Homicidal Thoughts:Homicidal Thoughts: No ? ? ?Sensorium  ?Memory:Immediate Good; Recent Poor ? ?Judgment:Poor ? ?Insight:Fair ? ? ?Executive Functions  ?Concentration:Fair ? ?Attention Span:Good ? ?Recall:Fair ? ?[employer] [place] ? ?Language:Fair ? ? ?Psychomotor Activity  ?Psychomotor Activity:Psychomotor Activity: Normal ? ? ?Assets  ?Assets:Communication Skills; Desire for Improvement; Housing; Resilience; Social Support ? ? ?Sleep  ?Sleep:Sleep: Fair ? ? ? ?Physical Exam: ?Physical Exam ?Vitals and nursing note reviewed.  ?Constitutional:   ?   Appearance: Normal appearance.  ?HENT:  ?   Head: Normocephalic and atraumatic.  ?   Mouth/Throat:  ?   Pharynx: Oropharynx is clear.  ?Eyes:  ?   Pupils: Pupils are equal, round, and reactive to light.  ?Cardiovascular:  ?   Rate and Rhythm: Normal rate and regular rhythm.  ?Pulmonary:  ?   Effort: Pulmonary effort is normal.  ?   Breath sounds: Normal breath sounds.  ?Abdominal:  ?   General: Abdomen is flat.  ?   Palpations: Abdomen is soft.  ?Musculoskeletal:     ?   General: Normal range of motion.  ?Skin: ?   General: Skin is warm and dry.  ?Neurological:  ?   General: No focal deficit present.  ?   Mental Status: He is alert. Mental status is at baseline.  ?Psychiatric:     ?   Attention and Perception: Attention normal.     ?  Mood and Affect: Mood normal. Affect is blunt.     ?   Speech: Speech is delayed.     ?   Behavior: Behavior  is slowed.     ?   Thought Content: Thought content normal. Thought content does not include suicidal ideation.     ?   Cognition and Memory: Memory is impaired.     ?   Judgment: Judgment normal.  ? ?Review of Systems  ?Constitutional: Negative.   ?HENT: Negative.    ?Eyes: Negative.   ?Respiratory: Negative.    ?Cardiovascular: Negative.   ?Gastrointestinal: Negative.   ?Musculoskeletal: Negative.   ?Skin: Negative.   ?Neurological: Negative.   ?Psychiatric/Behavioral:  Positive for depression and substance abuse. Negative for hallucinations, memory loss and suicidal ideas. The patient is nervous/anxious and has insomnia.   ?Blood pressure 137/84, pulse 72, temperature 98.2 ?F (36.8 ?C), temperature source Oral, resp. rate 18, height '5\' 5"'$  (1.651 m), weight 75.8 kg, SpO2 97 %. Body mass index is 27.79 kg/m?. ? ? ?COGNITIVE FEATURES THAT CONTRIBUTE TO RISK:  ?None   ? ?SUICIDE RISK:  ? Mild:  Suicidal ideation of limited frequency, intensity, duration, and specificity.  There are no identifiable plans, no associated intent, mild dysphoria and related symptoms, good self-control (both objective and subjective assessment), few other risk factors, and identifiable protective factors, including available and accessible social support. ? ?PLAN OF CARE: Continue 15-minute checks.  Alcohol withdrawal orders.  Restart psychiatric medications for PTSD depression chronic anxiety.  Assess with treatment team tomorrow.  Ongoing assessment of dangerousness prior to discharge planning ? ?I certify that inpatient services furnished can reasonably be expected to improve the patient's condition.  ? ?Alethia Berthold, MD ?10/19/2021, 5:39 PM ? ?

## 2021-10-19 NOTE — Group Note (Addendum)
LCSW Group Therapy Note ? ? ?Group Date: 10/19/2021 ?Start Time: 1300 ?End Time: 2951 ? ? ?Type of Therapy and Topic:  Group Therapy:  ? ?Participation Level:  Active ? ?Description of Group:   ? In this group patients will be encouraged to explore what they see as obstacles to their own wellness and recovery. They will be guided to discuss their thoughts, feelings, and behaviors related to these obstacles. The group will process together ways to cope with barriers, with attention given to specific choices patients can make. Each patient will be challenged to identify changes they are motivated to make in order to overcome their obstacles. This group will be process-oriented, with patients participating in exploration of their own experiences as well as giving and receiving support and challenge from other group members. ?  ?Therapeutic Goals: ?Patient will identify personal and current obstacles as they relate to admission. ?Patient will identify barriers that currently interfere with their wellness or overcoming obstacles.  ?Patient will identify feelings, thought process and behaviors related to these barriers. ?Patient will identify two changes they are willing to make to overcome these obstacles:  ?  ? ? ?Summary of Patient Progress:   ?Patient was the only one in group. Patient went over a few things that's been on his mind. Patient was very pleasant and vocal. Patient expressed that he would like to travel back to his home country of [place].  He reports that he plans on leaving in April 2023.  He reports that he will live with his brothers, both of whom are sober and will be a support to help him become sober.  ? ? ? Therapeutic Modalities:  ? ?Rozann Lesches, LCSWA ?10/19/2021  4:11 PM   ? ?

## 2021-10-19 NOTE — BHH Suicide Risk Assessment (Signed)
BHH INPATIENT:  Family/Significant Other Suicide Prevention Education ? ?Suicide Prevention Education:  ?Education Completed; Barista,  (ex-spouse) has been identified by the patient as the family member/significant other with whom the patient will be residing, and identified as the person(s) who will aid the patient in the event of a mental health crisis (suicidal ideations/suicide attempt).  With written consent from the patient, the family member/significant other has been provided the following suicide prevention education, prior to the and/or following the discharge of the patient. ? ?The suicide prevention education provided includes the following: ?Suicide risk factors ?Suicide prevention and interventions ?National Suicide Hotline telephone number ?[facility] assessment telephone number ?[facility] Emergency Assistance 911 ?[place] and/or Residential Mobile Crisis Unit telephone number ? ?Request made of family/significant other to: ?Remove weapons (e.g., guns, rifles, knives), all items previously/currently identified as safety concern.   ?Remove drugs/medications (over-the-counter, prescriptions, illicit drugs), all items previously/currently identified as a safety concern. ? ?The family member/significant other verbalizes understanding of the suicide prevention education information provided.  The family member/significant other agrees to remove the items of safety concern listed above. ? ?Durenda Hurt ?10/19/2021, 3:30 PM ?

## 2021-10-19 NOTE — Progress Notes (Signed)
Patient is A&Ox4, denies pain, and med compliant. Patient stated he finally slept well last night and is aware of his current situation involving alcohol. Patient stated his PTSD has been hard to deal with for the past 35 years and drinking has been the one thing to help make it go away. Patient did state feeling tired of his constant relapse and does not want to lose the support of his family. Patient plans on apologizing but admits that it is very hard for him to cope. Patients goal is to get back home. Alternative and positive coping methods discussed with patient. Patient has been socializing appropriately and able to laugh/joke. Currently denies SI, HI, and A/V/H although still depressed. Denies any plan/intent. Pt also stated feeling better since the voices have stopped. Patient has been getting up for meals and denies any n/v. Patient stated ativan has been helping with his anxiety with CIWA scores maintaining at 1 thus far. Encouraged to attend group. No s/s of current distress. Q15 minute checks for safety.  ? ? ? 10/19/21 0830  ?Psych Admission Type (Psych Patients Only)  ?Admission Status Involuntary  ?Psychosocial Assessment  ?Patient Complaints Depression  ?Eye Contact Fair  ?Facial Expression Flat  ?Affect Depressed  ?Speech Logical/coherent  ?Interaction Assertive  ?Motor Activity Slow  ?Appearance/Hygiene In scrubs  ?Behavior Characteristics Cooperative;Appropriate to situation  ?Mood Depressed  ?Thought Process  ?Coherency WDL  ?Content WDL  ?Delusions None reported or observed  ?Perception WDL  ?Hallucination None reported or observed  ?Judgment Limited  ?Confusion WDL  ?Danger to Self  ?Current suicidal ideation? Denies  ?Danger to Others  ?Danger to Others None reported or observed  ? ? ?

## 2021-10-19 NOTE — H&P (Signed)
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  Patient well known to the psychiatric service.  56 year old man with longstanding struggles with alcohol abuse and PTSD.  Brought to the emergency room with a blood alcohol level over 400 and reports that he had made suicidal statements.  Patient was depressed and anxious and somewhat disorganized when intoxicated.  Today he is sobered up and is cooperative and lucid in his history.  He describes to me how for the past several months he has been struggling with rehab programs.  He has been to several different programs and has been able to put together sobriety for a few months at a time here and there but has still not been able to stay really long-term stable.  Most recently he said he had stayed sober for 2 months but then something upsetting him and a couple days ago he went out and bought some liquor and was drinking heavily.  Patient says he has no intention or wish to die but realizes that he acts inappropriately when intoxicated.  He still has chronic PTSD symptoms with frequent nightmares flashbacks ruminations about past trauma all of which drive a lot of his anxiety.  He has not been taking any psychiatric medicine for an unknown amount of time.  Sounds like family life has deteriorated.  He and his wife are divorced he is living pretty much by himself although the wife and daughters provide a little bit of  assistance. Associated Signs/Symptoms: Depression Symptoms:  depressed mood, insomnia, fatigue, difficulty concentrating, recurrent thoughts of death, anxiety, Duration of Depression Symptoms: Greater than two weeks  (Hypo) Manic Symptoms:  Distractibility, Impulsivity, Anxiety Symptoms:  Excessive Worry, Panic Symptoms, Psychotic Symptoms:   None when sober PTSD Symptoms: Patient has chronic reexperiencing of military trauma chronic avoidance hyperreactivity and depression Total Time spent with patient: 1 hour  Past Psychiatric History: Past history of PTSD based on military trauma as well as longstanding history of alcohol abuse.  Has had episodes of delirium tremens in the past but not consistently.  Is the patient at risk to self? Yes.    Has the patient been a risk to self in the past 6 months? Yes.    Has the patient been a risk to self within the distant past? Yes.    Is the patient a risk to others? No.  Has the patient been a risk to others in the past 6 months? No.  Has the patient been a risk to others within the distant past? No.   Prior Inpatient Therapy:   Prior Outpatient Therapy:    Alcohol Screening: 1. How often do you have a drink containing alcohol?: 2 to 4 times a month 2. How many drinks containing alcohol do you have on a typical day when you are drinking?: 5 or 6 3. How often do you have six or more drinks on one occasion?: Monthly AUDIT-C Score: 6 4. How often during the last year have you found that you were not able to stop drinking once you had started?: Never 5. How often  during the last year have you failed to do what was normally expected from you because of drinking?: Never 6. How often during the last year have you needed a first drink in the morning to get yourself going after a heavy drinking session?: Never 7. How often during the last year have you had a feeling of guilt of remorse after drinking?: Never 8. How often during the last year  have you been unable to remember what happened the night before because you had been drinking?: Never 9. Have you or someone else been injured as a result of your drinking?: No 10. Has a relative or friend or a doctor or another health worker been concerned about your drinking or suggested you cut down?: No Alcohol Use Disorder Identification Test Final Score (AUDIT): 6 Substance Abuse History in the last 12 months:  Yes.   Consequences of Substance Abuse: Medical complications poor health poor self-care worsening of anxiety and depression and triggering of suicidal thoughts Previous Psychotropic Medications: Yes  Psychological Evaluations: Yes  Past Medical History:  Past Medical History:  Diagnosis Date   Alcohol withdrawal ([place])    B12 deficiency    Chronic pain    CVA (cerebral vascular accident) ([place])    Hypertension    Hypothyroidism    Varicose veins     Past Surgical History:  Procedure Laterality Date   CHOLECYSTECTOMY     COLONOSCOPY WITH PROPOFOL N/A 03/22/2021   Procedure: COLONOSCOPY WITH PROPOFOL;  Surgeon: Toledo, Benay Pike, MD;  Location: [facility] ENDOSCOPY;  Service: Gastroenterology;  Laterality: N/A;  SPANISH INTERPRETER   ESOPHAGOGASTRODUODENOSCOPY N/A 03/22/2021   Procedure: ESOPHAGOGASTRODUODENOSCOPY (EGD);  Surgeon: Toledo, Benay Pike, MD;  Location: [facility] ENDOSCOPY;  Service: Gastroenterology;  Laterality: N/A;   ESOPHAGOGASTRODUODENOSCOPY (EGD) WITH PROPOFOL N/A 10/24/2017   Procedure: ESOPHAGOGASTRODUODENOSCOPY (EGD) WITH PROPOFOL;  Surgeon: [name], MD;  Location: [facility] ENDOSCOPY;  Service: Endoscopy;  Laterality: N/A;   ESOPHAGOGASTRODUODENOSCOPY (EGD) WITH PROPOFOL N/A 07/12/2019   Procedure: ESOPHAGOGASTRODUODENOSCOPY (EGD) WITH PROPOFOL;  Surgeon: Jonathon Bellows, MD;  Location: [facility] ENDOSCOPY;  Service: Gastroenterology;  Laterality: N/A;   LEG SURGERY     Family History: History reviewed. No pertinent family history. Family Psychiatric  History: History of  alcohol abuse in father and others Tobacco Screening:   Social History:  Social History   Substance and Sexual Activity  Alcohol Use Yes   Alcohol/week: 21.0 standard drinks   Types: 21 Shots of liquor per week   Comment: daily- 1 pint of vodka     Social History   Substance and Sexual Activity  Drug Use No    Additional Social History: Marital status: Divorced Divorced, when?: 2018 (estimated) What types of issues is patient dealing with in the relationship?: Patient reports his daughters and wife do not want to be around him when he drinks. Are you sexually active?: No What is your sexual orientation?: Heterosexual Has your sexual activity been affected by drugs, alcohol, medication, or emotional stress?: None reported How many children?: 2 How is patient's relationship with their children?: Per last assessment, "good relationships"                         Allergies:  No Known Allergies Lab Results:  Results for orders placed or performed during the hospital encounter of 10/17/21 (from the past 48 hour(s))  Comprehensive metabolic panel     Status: Abnormal   Collection Time: 10/17/21  7:24 PM  Result Value Ref Range  Sodium 142 135 - 145 mmol/L   Potassium 4.3 3.5 - 5.1 mmol/L   Chloride 105 98 - 111 mmol/L   CO2 28 22 - 32 mmol/L   Glucose, Bld 91 70 - 99 mg/dL    Comment: Glucose reference range applies only to samples taken after fasting for at least 8 hours.   BUN 10 6 - 20 mg/dL   Creatinine, Ser 0.93 0.61 - 1.24 mg/dL   Calcium 8.7 (L) 8.9 - 10.3 mg/dL   Total Protein 8.1 6.5 - 8.1 g/dL   Albumin 4.2 3.5 - 5.0 g/dL   AST 36 15 - 41 U/L   ALT 19 0 - 44 U/L   Alkaline Phosphatase 69 38 - 126 U/L   Total Bilirubin 0.8 0.3 - 1.2 mg/dL   GFR, Estimated >60 >60 mL/min    Comment: (NOTE) Calculated using the CKD-EPI Creatinine Equation (2021)    Anion gap 9 5 - 15    Comment: Performed at [facility], [address] [place], [place]  [address]  CBC with Differential     Status: Abnormal   Collection Time: 10/17/21  7:24 PM  Result Value Ref Range   WBC 10.1 4.0 - 10.5 K/uL   RBC 5.11 4.22 - 5.81 MIL/uL   Hemoglobin 15.1 13.0 - 17.0 g/dL   HCT 46.0 39.0 - 52.0 %   MCV 90.0 80.0 - 100.0 fL   MCH 29.5 26.0 - 34.0 pg   MCHC 32.8 30.0 - 36.0 g/dL   RDW 14.5 11.5 - 15.5 %   Platelets 182 150 - 400 K/uL   nRBC 0.0 0.0 - 0.2 %   Neutrophils Relative % 36 %   Neutro Abs 3.7 1.7 - 7.7 K/uL   Lymphocytes Relative 56 %   Lymphs Abs 5.6 (H) 0.7 - 4.0 K/uL   Monocytes Relative 6 %   Monocytes Absolute 0.6 0.1 - 1.0 K/uL   Eosinophils Relative 1 %   Eosinophils Absolute 0.1 0.0 - 0.5 K/uL   Basophils Relative 1 %   Basophils Absolute 0.1 0.0 - 0.1 K/uL   WBC Morphology MORPHOLOGY UNREMARKABLE    RBC Morphology MORPHOLOGY UNREMARKABLE    Smear Review MORPHOLOGY UNREMARKABLE    Immature Granulocytes 0 %   Abs Immature Granulocytes 0.04 0.00 - 0.07 K/uL    Comment: Performed at [facility], [address] [place], [place] [address]  Urinalysis, Routine w reflex microscopic Urine, Clean Catch     Status: Abnormal   Collection Time: 10/17/21  7:24 PM  Result Value Ref Range   Color, Urine STRAW (A) YELLOW   APPearance CLEAR (A) CLEAR   Specific Gravity, Urine 1.006 1.005 - 1.030   pH 5.0 5.0 - 8.0   Glucose, UA NEGATIVE NEGATIVE mg/dL   Hgb urine dipstick NEGATIVE NEGATIVE   Bilirubin Urine NEGATIVE NEGATIVE   Ketones, ur NEGATIVE NEGATIVE mg/dL   Protein, ur NEGATIVE NEGATIVE mg/dL   Nitrite NEGATIVE NEGATIVE   Leukocytes,Ua NEGATIVE NEGATIVE    Comment: Performed at [facility], [address], [place], [place] [address]  Urine Drug Screen, Qualitative     Status: None   Collection Time: 10/17/21  7:24 PM  Result Value Ref Range   Tricyclic, Ur Screen NONE DETECTED NONE DETECTED   Amphetamines, Ur Screen NONE DETECTED NONE DETECTED   MDMA (Ecstasy)Ur Screen NONE DETECTED NONE DETECTED   Cocaine  Metabolite,Ur  NONE DETECTED NONE DETECTED   Opiate, Ur Screen NONE DETECTED NONE DETECTED  Phencyclidine (PCP) Ur S NONE DETECTED NONE DETECTED   Cannabinoid 50 Ng, Ur [place] NONE DETECTED NONE DETECTED   Barbiturates, Ur Screen NONE DETECTED NONE DETECTED   Benzodiazepine, Ur Scrn NONE DETECTED NONE DETECTED   Methadone Scn, Ur NONE DETECTED NONE DETECTED    Comment: (NOTE) Tricyclics + metabolites, urine    Cutoff 1000 ng/mL Amphetamines + metabolites, urine  Cutoff 1000 ng/mL MDMA (Ecstasy), urine              Cutoff 500 ng/mL Cocaine Metabolite, urine          Cutoff 300 ng/mL Opiate + metabolites, urine        Cutoff 300 ng/mL Phencyclidine (PCP), urine         Cutoff 25 ng/mL Cannabinoid, urine                 Cutoff 50 ng/mL Barbiturates + metabolites, urine  Cutoff 200 ng/mL Benzodiazepine, urine              Cutoff 200 ng/mL Methadone, urine                   Cutoff 300 ng/mL  The urine drug screen provides only a preliminary, unconfirmed analytical test result and should not be used for non-medical purposes. Clinical consideration and professional judgment should be applied to any positive drug screen result due to possible interfering substances. A more specific alternate chemical method must be used in order to obtain a confirmed analytical result. Gas chromatography / mass spectrometry (GC/MS) is the preferred confirm atory method. Performed at [facility], [address] [place], [place] [address]   Resp Panel by RT-PCR (Flu A&B, Covid) Nasopharyngeal Swab     Status: None   Collection Time: 10/17/21  7:24 PM   Specimen: Nasopharyngeal Swab; Nasopharyngeal(NP) swabs in vial transport medium  Result Value Ref Range   SARS Coronavirus 2 by RT PCR NEGATIVE NEGATIVE    Comment: (NOTE) SARS-CoV-2 target nucleic acids are NOT DETECTED.  The SARS-CoV-2 RNA is generally detectable in upper respiratory specimens during the acute phase of infection. The  lowest concentration of SARS-CoV-2 viral copies this assay can detect is 138 copies/mL. A negative result does not preclude SARS-Cov-2 infection and should not be used as the sole basis for treatment or other patient management decisions. A negative result may occur with  improper specimen collection/handling, submission of specimen other than nasopharyngeal swab, presence of viral mutation(s) within the areas targeted by this assay, and inadequate number of viral copies(<138 copies/mL). A negative result must be combined with clinical observations, patient history, and epidemiological information. The expected result is Negative.  Fact Sheet for Patients:  EntrepreneurPulse.com.au  Fact Sheet for Healthcare Providers:  IncredibleEmployment.be  This test is no t yet approved or cleared by the [place] FDA and  has been authorized for detection and/or diagnosis of SARS-CoV-2 by FDA under an Emergency Use Authorization (EUA). This EUA will remain  in effect (meaning this test can be used) for the duration of the COVID-19 declaration under Section 564(b)(1) of the Act, 21 U.S.C.section 360bbb-3(b)(1), unless the authorization is terminated  or revoked sooner.       Influenza A by PCR NEGATIVE NEGATIVE   Influenza B by PCR NEGATIVE NEGATIVE    Comment: (NOTE) The Xpert Xpress SARS-CoV-2/FLU/RSV plus assay is intended as an aid in the diagnosis of influenza from Nasopharyngeal swab specimens and should not be used as a sole basis for treatment. Nasal washings  and aspirates are unacceptable for Xpert Xpress SARS-CoV-2/FLU/RSV testing.  Fact Sheet for Patients: EntrepreneurPulse.com.au  Fact Sheet for Healthcare Providers: IncredibleEmployment.be  This test is not yet approved or cleared by the [place] FDA and has been authorized for detection and/or diagnosis of SARS-CoV-2 by FDA under an Emergency  Use Authorization (EUA). This EUA will remain in effect (meaning this test can be used) for the duration of the COVID-19 declaration under Section 564(b)(1) of the Act, 21 U.S.C. section 360bbb-3(b)(1), unless the authorization is terminated or revoked.  Performed at [facility], [address] [place], [place] [address]   Ethanol     Status: Abnormal   Collection Time: 10/17/21  7:24 PM  Result Value Ref Range   Alcohol, Ethyl (B) 423 (HH) <10 mg/dL    Comment: CRITICAL RESULT CALLED TO, READ BACK BY AND VERIFIED WITH MATHEW BASSETT 2016 10/17/21 MU (NOTE) Lowest detectable limit for serum alcohol is 10 mg/dL.  For medical purposes only. Performed at [facility], [address] [place], [place] [address]   Acetaminophen level     Status: Abnormal   Collection Time: 10/17/21  7:24 PM  Result Value Ref Range   Acetaminophen (Tylenol), Serum <10 (L) 10 - 30 ug/mL    Comment: (NOTE) Therapeutic concentrations vary significantly. A range of 10-30 ug/mL  may be an effective concentration for many patients. However, some  are best treated at concentrations outside of this range. Acetaminophen concentrations >150 ug/mL at 4 hours after ingestion  and >50 ug/mL at 12 hours after ingestion are often associated with  toxic reactions.  Performed at [facility], [address] [place], [place] [address]   Salicylate level     Status: Abnormal   Collection Time: 10/17/21  7:24 PM  Result Value Ref Range   Salicylate Lvl <4.1 (L) 7.0 - 30.0 mg/dL    Comment: Performed at [facility], [place], [place] [address]  Troponin I (High Sensitivity)     Status: None   Collection Time: 10/17/21  7:24 PM  Result Value Ref Range   Troponin I (High Sensitivity) 5 <18 ng/L    Comment: (NOTE) Elevated high sensitivity troponin I (hsTnI) values and significant  changes across serial measurements may suggest ACS but many other  chronic  and acute conditions are known to elevate hsTnI results.  Refer to the "Links" section for chest pain algorithms and additional  guidance. Performed at [facility], [address] [place], [place] [address]   Lactic acid, plasma     Status: Abnormal   Collection Time: 10/17/21  7:24 PM  Result Value Ref Range   Lactic Acid, Venous 2.3 (HH) 0.5 - 1.9 mmol/L    Comment: CRITICAL RESULT CALLED TO, READ BACK BY AND VERIFIED WITH MATHEW BASSETT 2016 10/17/21 MU Performed at [facility], [address] [place], [place] [address]   Lactic acid, plasma     Status: Abnormal   Collection Time: 10/17/21  9:21 PM  Result Value Ref Range   Lactic Acid, Venous 3.2 (HH) 0.5 - 1.9 mmol/L    Comment: CRITICAL VALUE NOTED. VALUE IS CONSISTENT WITH PREVIOUSLY REPORTED/CALLED VALUE RH Performed at [facility], [address] [place], [place] [address]   Troponin I (High Sensitivity)     Status: None   Collection Time: 10/17/21  9:21 PM  Result Value Ref Range   Troponin I (High Sensitivity) 4 <18 ng/L    Comment: (NOTE)  Elevated high sensitivity troponin I (hsTnI) values and significant  changes across serial measurements may suggest ACS but many other  chronic and acute conditions are known to elevate hsTnI results.  Refer to the "Links" section for chest pain algorithms and additional  guidance. Performed at [facility], [address] [place], [place] [address]     Blood Alcohol level:  Lab Results  Component Value Date   ETH 423 [facility]) 10/17/2021   ETH 252 (H) 71/12/2692    Metabolic Disorder Labs:  Lab Results  Component Value Date   HGBA1C 5.3 11/08/2020   MPG 105.41 11/08/2020   MPG 105 05/12/2020   No results found for: PROLACTIN Lab Results  Component Value Date   CHOL 167 11/08/2020   TRIG 89 11/08/2020   HDL 90 11/08/2020   CHOLHDL 1.9 11/08/2020   VLDL 18 11/08/2020   LDLCALC 59 11/08/2020   LDLCALC 103 (H) 05/12/2020     Current Medications: Current Facility-Administered Medications  Medication Dose Route Frequency Provider Last Rate Last Admin   acetaminophen (TYLENOL) tablet 650 mg  650 mg Oral Q6H PRN Sherlon Handing, NP       alum & mag hydroxide-simeth (MAALOX/MYLANTA) 200-200-20 MG/5ML suspension 30 mL  30 mL Oral Q4H PRN Sherlon Handing, NP       busPIRone (BUSPAR) tablet 10 mg  10 mg Oral TID [place] Delauder, Madie Reno, MD   10 mg at 10/19/21 1655   FLUoxetine (PROZAC) capsule 20 mg  20 mg Oral Daily Laelani Vasko, Madie Reno, MD   20 mg at 10/19/21 1443   gabapentin (NEURONTIN) capsule 800 mg  800 mg Oral TID Khamya Topp, Madie Reno, MD   800 mg at 10/19/21 1656   hydrOXYzine (ATARAX) tablet 25 mg  25 mg Oral Q6H PRN Sherlon Handing, NP       [START ON 10/20/2021] levothyroxine (SYNTHROID) tablet 125 mcg  125 mcg Oral Q0600 Pauleen Goleman, Madie Reno, MD       lisinopril (ZESTRIL) tablet 5 mg  5 mg Oral Daily Zyonna Vardaman, Madie Reno, MD   5 mg at 10/19/21 1442   loperamide (IMODIUM) capsule 2-4 mg  2-4 mg Oral PRN Sherlon Handing, NP       LORazepam (ATIVAN) tablet 1 mg  1 mg Oral Q6H PRN Sherlon Handing, NP       LORazepam (ATIVAN) tablet 1 mg  1 mg Oral TID Sherlon Handing, NP   1 mg at 10/19/21 1656   Followed by   Derrill Memo ON 10/20/2021] LORazepam (ATIVAN) tablet 1 mg  1 mg Oral BID Waldon Merl F, NP       Followed by   Derrill Memo ON 10/22/2021] LORazepam (ATIVAN) tablet 1 mg  1 mg Oral Daily Waldon Merl F, NP       magnesium hydroxide (MILK OF MAGNESIA) suspension 30 mL  30 mL Oral Daily PRN Sherlon Handing, NP       multivitamin with minerals tablet 1 tablet  1 tablet Oral Daily Waldon Merl F, NP   1 tablet at 10/19/21 0830   ondansetron (ZOFRAN-ODT) disintegrating tablet 4 mg  4 mg Oral Q6H PRN Waldon Merl F, NP       pantoprazole (PROTONIX) EC tablet 40 mg  40 mg Oral Daily Sharnika Binney, Madie Reno, MD   40 mg at 10/19/21 1442   prazosin (MINIPRESS) capsule 1 mg  1 mg Oral QHS Drakkar Medeiros, Madie Reno, MD       [START ON  10/20/2021]  thiamine tablet 100 mg  100 mg Oral Daily Waldon Merl F, NP       PTA Medications: Medications Prior to Admission  Medication Sig Dispense Refill Last Dose   acamprosate (CAMPRAL) 333 MG tablet Take 2 tablets (666 mg total) by mouth 3 (three) times daily with meals. (Patient not taking: Reported on 05/25/2021) 180 tablet 1    busPIRone (BUSPAR) 15 MG tablet Take 15 mg by mouth 2 (two) times daily. (Patient not taking: Reported on 05/25/2021)      cloNIDine (CATAPRES) 0.1 MG tablet Take 0.1 mg by mouth 3 (three) times daily as needed. (Patient not taking: Reported on 05/25/2021)      cyclobenzaprine (FLEXERIL) 10 MG tablet Take 1 tablet by mouth 3 (three) times daily. (Patient not taking: Reported on 05/25/2021)      FLUoxetine (PROZAC) 40 MG capsule Take 2 capsules (80 mg total) by mouth daily. (Patient not taking: Reported on 05/25/2021) 60 capsule 1    folic acid (FOLVITE) 1 MG tablet Take 1 mg by mouth daily. (Patient not taking: Reported on 05/25/2021)      gabapentin (NEURONTIN) 800 MG tablet Take 1 tablet (800 mg total) by mouth 3 (three) times daily. 90 tablet 1    HYDROcodone Bitartrate ER 30 MG CP12 Take 30 mg by mouth. (Patient not taking: Reported on 05/25/2021)      hydrOXYzine (ATARAX/VISTARIL) 25 MG tablet Take 1 tablet (25 mg total) by mouth every 8 (eight) hours as needed for itching. 90 tablet 1    lactulose (CHRONULAC) 10 GM/15ML solution Take 30 g by mouth 2 (two) times daily. (Patient not taking: Reported on 05/25/2021)      levothyroxine (SYNTHROID) 125 MCG tablet Take 1 tablet (125 mcg total) by mouth daily at 6 (six) AM. 30 tablet 1    lisinopril (ZESTRIL) 5 MG tablet Take 1 tablet (5 mg total) by mouth daily. (Patient not taking: Reported on 10/18/2021) 30 tablet 1    LORazepam (ATIVAN) 0.5 MG tablet Take 0.5 mg by mouth daily as needed. (Patient not taking: Reported on 10/18/2021)      pantoprazole (PROTONIX) 40 MG tablet Take 1 tablet (40 mg total) by mouth daily at  12 noon. (Patient not taking: Reported on 05/25/2021) 30 tablet 1    prazosin (MINIPRESS) 1 MG capsule Take 1 capsule (1 mg total) by mouth at bedtime. (Patient not taking: Reported on 05/25/2021) 30 capsule 1    pregabalin (LYRICA) 100 MG capsule Take 100 mg by mouth 2 (two) times daily. (Patient not taking: Reported on 12/05/2020)      QUEtiapine (SEROQUEL) 200 MG tablet Take 1 tablet (200 mg total) by mouth at bedtime. (Patient not taking: Reported on 05/25/2021) 30 tablet 1    thiamine 100 MG tablet Take 1 tablet (100 mg total) by mouth daily. (Patient not taking: Reported on 05/25/2021) 30 tablet 1    traZODone (DESYREL) 150 MG tablet Take 150 mg by mouth at bedtime.       Musculoskeletal: Strength & Muscle Tone: within normal limits Gait & Station: normal Patient leans: N/A            Psychiatric Specialty Exam:  Presentation  General Appearance: Appropriate for Environment  Eye Contact:Good  Speech:Clear and Coherent  Speech Volume:Normal  Handedness:Right   Mood and Affect  Mood:Depressed; Hopeless  Affect:Congruent   Thought Process  Thought Processes:Coherent  Duration of Psychotic Symptoms: Greater than six months  Past Diagnosis of Schizophrenia or Psychoactive disorder: No  Descriptions  of Associations:Intact  Orientation:Full (Time, Place and Person)  Thought Content:Logical  Hallucinations:Hallucinations: -- (none currently)  Ideas of Reference:None  Suicidal Thoughts:Suicidal Thoughts: Yes, Passive  Homicidal Thoughts:Homicidal Thoughts: No   Sensorium  Memory:Immediate Good; Recent Poor  Judgment:Poor  Insight:Fair   Executive Functions  Concentration:Fair  Attention Span:Good  [place] of Knowledge:Good  Language:Fair   Psychomotor Activity  Psychomotor Activity:Psychomotor Activity: Normal   Assets  Assets:Communication Skills; Desire for Improvement; Housing; Resilience; Social Support   Sleep   Sleep:Sleep: Fair    Physical Exam: Physical Exam Vitals and nursing note reviewed.  Constitutional:      Appearance: Normal appearance.  HENT:     Head: Normocephalic and atraumatic.     Mouth/Throat:     Pharynx: Oropharynx is clear.  Eyes:     Pupils: Pupils are equal, round, and reactive to light.  Cardiovascular:     Rate and Rhythm: Normal rate and regular rhythm.  Pulmonary:     Effort: Pulmonary effort is normal.     Breath sounds: Normal breath sounds.  Abdominal:     General: Abdomen is flat.     Palpations: Abdomen is soft.  Musculoskeletal:        General: Normal range of motion.  Skin:    General: Skin is warm and dry.  Neurological:     General: No focal deficit present.     Mental Status: He is alert. Mental status is at baseline.  Psychiatric:        Attention and Perception: Attention normal.        Mood and Affect: Mood normal. Affect is blunt.        Speech: Speech is delayed.        Behavior: Behavior is slowed.        Thought Content: Thought content normal.        Cognition and Memory: Memory is impaired.        Judgment: Judgment normal.   Review of Systems  Constitutional: Negative.   HENT: Negative.    Eyes: Negative.   Respiratory: Negative.    Cardiovascular: Negative.   Gastrointestinal: Negative.   Musculoskeletal: Negative.   Skin: Negative.   Neurological: Negative.   Psychiatric/Behavioral:  Positive for depression and substance abuse. Negative for hallucinations and suicidal ideas. The patient is nervous/anxious.   Blood pressure 137/84, pulse 72, temperature 98.2 F (36.8 C), temperature source Oral, resp. rate 18, height '5\' 5"'$  (1.651 m), weight 75.8 kg, SpO2 97 %. Body mass index is 27.79 kg/m.  Treatment Plan Summary: Medication management and Plan patient was very insightful and appropriate during the interview.  He tells me that he realizes that he is going to die from his alcohol abuse if something cannot really change.   He has been frustrated in his struggles to get stable mental health treatment part of which he blames on the inadequacy of his insurance.  Patient says that his plan at this point is that within about a month he hopes to travel back to his home country of [place].  He will be staying with his brothers and sisters and says that that is an alcohol free highly religious environment where he will be supported.  He seems to have a pretty realistic notion that this might be helpful for him.  For now we will continue with treatment for alcohol withdrawal as needed and also restart him on some of his psychiatric medicine including for PTSD and also on his thyroid and  blood pressure medicine.  Reassess daily me and meet with treatment team tomorrow.  Possible discharge just a couple days.  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    Alethia Berthold,  MD 3/30/20235:41 PM

## 2021-10-19 NOTE — BHH Counselor (Signed)
Adult Comprehensive Assessment ? ?Patient ID: Ivan Hamilton, male   DOB: 1966-07-21, 56 y.o.   MRN: [id number] ? ?Information Source: ?Information source: Patient ? ?Current Stressors:  ?Patient states their primary concerns and needs for treatment are:: Patient states he has diffculty sustaining sobriety from alcohol ?Patient states their goals for this hospitilization and ongoing recovery are:: Patient states he would like to stabilize and work towards sobriety in the outpatient setting. ?Educational / Learning stressors: none reported ?Employment / Job issues: none reported ?Family Relationships: none reported ?Financial / Lack of resources (include bankruptcy): none reported ?Housing / Lack of housing: none reported ?Physical health (include injuries & life threatening diseases): reports difficulty with eye sight. ?Social relationships: none reported ?Substance abuse: reports significant difficulty with PTSD symptoms which triggers him to drink alcohol ?Bereavement / Loss: reports difficulty with sustained bereavment from friends lost during war in [place] ? ?Living/Environment/Situation:  ?Living Arrangements: Spouse/significant other ?Living conditions (as described by patient or guardian): states living conditions are WNL. ?Who else lives in the home?: Patient lives with ex-spouse, daughters visit periodically ?How long has patient lived in current situation?: 16 years ?What is atmosphere in current home: Comfortable, Supportive ? ?Family History:  ?Marital status: Divorced ?Divorced, when?: 2018 (estimated) ?What types of issues is patient dealing with in the relationship?: Patient reports his daughters and wife do not want to be around him when he drinks. ?Are you sexually active?: No ?What is your sexual orientation?: Heterosexual ?Has your sexual activity been affected by drugs, alcohol, medication, or emotional stress?: None reported ?How many children?: 2 ?How is patient's relationship with their  children?: Per last assessment, "good relationships" ? ?Childhood History:  ?By whom was/is the patient raised?: Both parents ?Description of patient's relationship with caregiver when they were a child: Per last pt assessment, good relationship ?Patient's description of current relationship with people who raised him/her: Both parents are deceased. ?How were you disciplined when you got in trouble as a child/adolescent?: None reported ?Does patient have siblings?: Yes ?Number of Siblings: 8 ?Description of patient's current relationship with siblings: 5 brothers, 3 sisters, "ok" relationships; patient reports he plans on returning to [place] to help his brothers build a family house. ?Did patient suffer any verbal/emotional/physical/sexual abuse as a child?: No ?Did patient suffer from severe childhood neglect?: No ?Has patient ever been sexually abused/assaulted/raped as an adolescent or adult?: No ?Witnessed domestic violence?: No ?Has patient been affected by domestic violence as an adult?: No ? ?Education:  ?Highest grade of school patient has completed: HS Diploma (foriegn) ?Currently a student?: No ?Learning disability?: No ? ?Employment/Work Situation:   ?Employment Situation: On disability ?Why is Patient on Disability: Mental health and injuries ?How Long has Patient Been on Disability: 5 years ?Patient's Job has Been Impacted by Current Illness: Yes ?Describe how Patient's Job has Been Impacted: Pt reports he cant focus on work when he is depressed; also reports difficulty maintaining a schedule due to acute mental health symptoms. ?What is the Longest Time Patient has Held a Job?: 16 years ?Where was the Patient Employed at that Time?: Psychologist, occupational ?Has Patient ever Been in the [place]?: Yes (Describe in comment) (Patient reports significant military trauma pertaining to war related asignments.) ?Did You Receive Any Psychiatric Treatment/Services While in the [place]?: Yes ? ?Financial  Resources:   ?Museum/gallery curator resources: [employer], Medicare ?Does patient have a representative payee or guardian?: No ? ?Alcohol/Substance Abuse:   ?What has been your use of drugs/alcohol within the  last 12 months?: Reports drinking [place] 3-4x weekly, 8 standard drinks per event. Reports 2 months of sobriety after last inpatient treatment discharge. ?If attempted suicide, did drugs/alcohol play a role in this?:  (n/a) ?Alcohol/Substance Abuse Treatment Hx: Past Tx, Inpatient, Past Tx, Outpatient ?Has alcohol/substance abuse ever caused legal problems?: No ? ?Social Support System:   ?Patient's Community Support System: Good ?Describe Community Support System: Patient reports his family is supportive of his mental health and sobriety goals. ? ?Leisure/Recreation:   ?Do You Have Hobbies?: No ? ?Strengths/Needs:   ?Patient states these barriers may affect/interfere with their treatment: none reported ?Patient states these barriers may affect their return to the community: none reported ?Other important information patient would like considered in planning for their treatment: none reported ? ?Discharge Plan:   ?Currently receiving community mental health services: No (Patient is interested in outpatient mental health services to include medication management and therapy.) ?Patient states they will know when they are safe and ready for discharge when: states he is unsure of how/when he will know if he is ready for discharge. ?Does patient have access to transportation?: Yes (Family to assist with transportation.) ?Does patient have financial barriers related to discharge medications?: No (Medicare Part A and B) ?Will patient be returning to same living situation after discharge?: Yes ? ?Summary/Recommendations:   ?Summary and Recommendations (to be completed by the evaluator): 56 y/o male with dx of alcohol use disorder, severe comorbid w/ PTSD, severe from [place] w/ Medicare Part A and B; admitted due to  suicidal ideation in the context of alcohol intoxication. 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 Reports 2 months of sobriety prior to recent relapse. Patient unclear about timeline or events leading to hospitalization, unaware of how he was transported to ED due to acute alcohol intoxication. Patient endorses stress related to complex bereavement and mental health. 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 Patient sought political asylum in the [place] after the installment of a new political regime at the end of the [place]. States he plans on returning to [place] to help his brother build a family home for his daughters; timeline is unclear. Patient is not currently connected to any mental health outpatient services though is interested in referrals. During assessment, patient presents as calm and cooperative, w/ blunted affect, no evidence of memory or concentration impairment, currently denies SI/HI/AVH, appearance is relatively WNL, insight is fair, judgement is poor. Therapeutic recommendations include crisis stabilization, medication management, group therapy, and case management. ? ?Durenda Hurt. 10/19/2021 ?

## 2021-10-19 NOTE — Plan of Care (Signed)
  Problem: Education: Goal: Emotional status will improve Outcome: Progressing Goal: Mental status will improve Outcome: Progressing   

## 2021-10-20 DIAGNOSIS — F4312 Post-traumatic stress disorder, chronic: Secondary | ICD-10-CM | POA: Diagnosis not present

## 2021-10-20 MED ORDER — IBUPROFEN 600 MG PO TABS
600.0000 mg | ORAL_TABLET | Freq: Four times a day (QID) | ORAL | Status: DC | PRN
  Administered 2021-10-20 – 2021-10-23 (×7): 600 mg via ORAL
  Filled 2021-10-20 (×7): qty 1

## 2021-10-20 NOTE — BH IP Treatment Plan (Signed)
Interdisciplinary Treatment and Diagnostic Plan Update ? ?10/20/2021 ?Time of Session: 9:30AM ?Ivan Hamilton ?MRN: [id number] ? 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 ?Secondary Diagnoses: Principal Problem: ?  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  Hypothyroid ?  Alcohol withdrawal ([place]) ?  Alcohol use disorder, severe, dependence ([place]) ? ? ?Current Medications:  ?Current Facility-Administered Medications  ?Medication Dose Route Frequency Provider Last Rate Last Admin  ? acetaminophen (TYLENOL) tablet 650 mg  650 mg Oral Q6H PRN Sherlon Handing, NP      ? alum & mag hydroxide-simeth (MAALOX/MYLANTA) 200-200-20 MG/5ML suspension 30 mL  30 mL Oral Q4H PRN Waldon Merl F, NP      ? busPIRone (BUSPAR) tablet 10 mg  10 mg Oral TID Clapacs, Madie Reno, MD   10 mg at 10/20/21 0827  ? FLUoxetine (PROZAC) capsule 20 mg  20 mg Oral Daily Clapacs, Madie Reno, MD   20 mg at 10/20/21 8115  ? gabapentin (NEURONTIN) capsule 800 mg  800 mg Oral TID Clapacs, John T, MD   800 mg at 10/20/21 0827  ? hydrOXYzine (ATARAX) tablet 25 mg  25 mg Oral Q6H PRN Waldon Merl F, NP      ? levothyroxine (SYNTHROID) tablet 125 mcg  125 mcg Oral Q0600 Gonzella Lex, MD   125 mcg at 10/20/21 7262  ? lisinopril (ZESTRIL) tablet 5 mg  5 mg Oral Daily Clapacs, Madie Reno, MD   5 mg at 10/20/21 0355  ? loperamide (IMODIUM) capsule 2-4 mg  2-4 mg Oral PRN Sherlon Handing, NP      ? LORazepam (ATIVAN) tablet 1 mg  1 mg Oral Q6H PRN Sherlon Handing, NP      ? LORazepam (ATIVAN) tablet 1 mg  1 mg Oral BID Sherlon Handing, NP      ? Followed by  ? Derrill Memo ON 10/22/2021] LORazepam (ATIVAN) tablet 1 mg  1 mg Oral Daily Waldon Merl F, NP      ? magnesium hydroxide (MILK OF MAGNESIA) suspension 30 mL  30 mL Oral Daily PRN Sherlon Handing, NP      ? multivitamin with minerals tablet 1 tablet  1 tablet Oral Daily Sherlon Handing, NP   1 tablet  at 10/20/21 0827  ? ondansetron (ZOFRAN-ODT) disintegrating tablet 4 mg  4 mg Oral Q6H PRN Waldon Merl F, NP      ? pantoprazole (PROTONIX) EC tablet 40 mg  40 mg Oral Daily Clapacs, Madie Reno, MD   40 mg at 10/20/21 9741  ? prazosin (MINIPRESS) capsule 1 mg  1 mg Oral QHS Clapacs, Madie Reno, MD   1 mg at 10/19/21 2106  ? thiamine tablet 100 mg  100 mg Oral Daily Waldon Merl F, NP   100 mg at 10/20/21 6384  ? ?PTA Medications: ?Medications Prior to Admission  ?Medication Sig Dispense Refill Last Dose  ? acamprosate (CAMPRAL) 333 MG tablet Take 2 tablets (666 mg total) by mouth 3 (three) times daily with meals. (Patient not taking: Reported on 05/25/2021) 180 tablet 1   ? busPIRone (BUSPAR) 15 MG tablet Take 15 mg by mouth 2 (two) times daily. (Patient not taking: Reported on 05/25/2021)     ? cloNIDine (CATAPRES) 0.1 MG tablet Take 0.1 mg by mouth 3 (three) times daily as needed. (Patient not taking: Reported on 05/25/2021)     ? cyclobenzaprine (FLEXERIL) 10 MG tablet Take 1 tablet by mouth 3 (  three) times daily. (Patient not taking: Reported on 05/25/2021)     ? FLUoxetine (PROZAC) 40 MG capsule Take 2 capsules (80 mg total) by mouth daily. (Patient not taking: Reported on 05/25/2021) 60 capsule 1   ? folic acid (FOLVITE) 1 MG tablet Take 1 mg by mouth daily. (Patient not taking: Reported on 05/25/2021)     ? gabapentin (NEURONTIN) 800 MG tablet Take 1 tablet (800 mg total) by mouth 3 (three) times daily. 90 tablet 1   ? HYDROcodone Bitartrate ER 30 MG CP12 Take 30 mg by mouth. (Patient not taking: Reported on 05/25/2021)     ? hydrOXYzine (ATARAX/VISTARIL) 25 MG tablet Take 1 tablet (25 mg total) by mouth every 8 (eight) hours as needed for itching. 90 tablet 1   ? lactulose (CHRONULAC) 10 GM/15ML solution Take 30 g by mouth 2 (two) times daily. (Patient not taking: Reported on 05/25/2021)     ? levothyroxine (SYNTHROID) 125 MCG tablet Take 1 tablet (125 mcg total) by mouth daily at 6 (six) AM. 30 tablet 1   ?  lisinopril (ZESTRIL) 5 MG tablet Take 1 tablet (5 mg total) by mouth daily. (Patient not taking: Reported on 10/18/2021) 30 tablet 1   ? LORazepam (ATIVAN) 0.5 MG tablet Take 0.5 mg by mouth daily as needed. (Patient not taking: Reported on 10/18/2021)     ? pantoprazole (PROTONIX) 40 MG tablet Take 1 tablet (40 mg total) by mouth daily at 12 noon. (Patient not taking: Reported on 05/25/2021) 30 tablet 1   ? prazosin (MINIPRESS) 1 MG capsule Take 1 capsule (1 mg total) by mouth at bedtime. (Patient not taking: Reported on 05/25/2021) 30 capsule 1   ? pregabalin (LYRICA) 100 MG capsule Take 100 mg by mouth 2 (two) times daily. (Patient not taking: Reported on 12/05/2020)     ? QUEtiapine (SEROQUEL) 200 MG tablet Take 1 tablet (200 mg total) by mouth at bedtime. (Patient not taking: Reported on 05/25/2021) 30 tablet 1   ? thiamine 100 MG tablet Take 1 tablet (100 mg total) by mouth daily. (Patient not taking: Reported on 05/25/2021) 30 tablet 1   ? traZODone (DESYREL) 150 MG tablet Take 150 mg by mouth at bedtime.     ? ? ?Patient Stressors: Marital or family conflict   ?Substance abuse   ? ?Patient Strengths: Ability for insight  ?Motivation for treatment/growth  ? ?Treatment Modalities: Medication Management, Group therapy, Case management,  ?1 to 1 session with clinician, Psychoeducation, Recreational therapy. ? ? 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 ?Short Term Goals: Ability to maintain clinical measurements within normal limits will improve ?Compliance with prescribed medications will improve ?Ability to verbalize feelings will improve ?Ability to disclose and discuss suicidal ideas ?Ability to demonstrate self-control will improve ? ?Medication Management: Evaluate patient's response, side effects, and tolerance of medication regimen. ? ?Therapeutic Interventions: 1  to 1 sessions, Unit Group sessions and Medication administration. ? ?Evaluation of Outcomes: Progressing ? ?Physician Treatment Plan for Secondary Diagnosis: Principal Problem: ?  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  Hypothyroid ?  Alcohol withdrawal ([place]) ?  Alcohol use disorder, severe, dependence ([place]) ? ?Long Term Goal(s): Improvement in symptoms so as ready for discharge  ? ?Short Term Goals: Ability to maintain clinical measurements within normal limits will improve ?Compliance with prescribed medications will improve ?Ability to verbalize  feelings will improve ?Ability to disclose and discuss suicidal ideas ?Ability to demonstrate self-control will improve    ? ?Medication Management: Evaluate patient's response, side effects, and tolerance of medication regimen. ? ?Therapeutic Interventions: 1 to 1 sessions, Unit Group sessions and Medication administration. ? ?Evaluation of Outcomes: Progressing ? ? 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 ?Short Term Goals: Ability to verbalize frustration and anger appropriately will improve, Ability to demonstrate self-control, Ability to participate in decision making will improve, Ability to verbalize feelings will improve, Ability to disclose and discuss suicidal ideas, Ability to identify and develop effective coping behaviors will improve, and Compliance with prescribed medications will improve ? ?Medication Management: RN will administer medications as ordered by provider, will assess and evaluate patient's response and provide education to patient for prescribed medication. RN will report any adverse and/or side effects to prescribing provider. ? ?Therapeutic Interventions: 1 on 1 counseling sessions, Psychoeducation, Medication administration,  Evaluate responses to treatment, Monitor vital signs and CBGs as ordered, Perform/monitor CIWA, COWS, AIMS and Fall Risk screenings as ordered, Perform wound care treatments as ordered. ? ?Evaluation of Outcom

## 2021-10-20 NOTE — Plan of Care (Signed)
D- Patient alert and oriented. Patient presented in a pleasant mood on assessment reporting that he slept "fair" last night and had complaints of chronic back pain, however, he did not request any pain medication at this time. Patient endorsed depression, anxiety and hopelessness on his self-inventory. He stated to this writer that "many things" is the reason why he's feeling this way. Patient denied SI, HI, AVH at this time. Patient had no stated goals for today. ? ?A- Scheduled medications administered to patient, per MD orders. Support and encouragement provided.  Routine safety checks conducted every 15 minutes.  Patient informed to notify staff with problems or concerns. ? ?R- No adverse drug reactions noted. Patient contracts for safety at this time. Patient compliant with medications and treatment plan. Patient receptive, calm, and cooperative. Patient interacts well with others on the unit.  Patient remains safe at this time. ? ?Problem: Education: ?Goal: Knowledge of [facility] Education information/materials will improve ?Outcome: Progressing ?Goal: Emotional status will improve ?Outcome: Progressing ?Goal: Mental status will improve ?Outcome: Progressing ?Goal: Verbalization of understanding the information provided will improve ?Outcome: Progressing ?  ?Problem: Safety: ?Goal: Periods of time without injury will increase ?Outcome: Progressing ?  ?Problem: Education: ?Goal: Utilization of techniques to improve thought processes will improve ?Outcome: Progressing ?Goal: Knowledge of the prescribed therapeutic regimen will improve ?Outcome: Progressing ?  ?Problem: Safety: ?Goal: Ability to disclose and discuss suicidal ideas will improve ?Outcome: Progressing ?Goal: Ability to identify and utilize support systems that promote safety will improve ?Outcome: Progressing ?  ?

## 2021-10-20 NOTE — Progress Notes (Signed)
Recreation Therapy Notes ? ?INPATIENT RECREATION THERAPY ASSESSMENT ? ?Patient Details ?Name: Ivan Hamilton ?MRN: [id number] ?DOB: 1965-10-20 ?Today's Date: 10/20/2021 ?      ?Information Obtained From: ?Patient ? ?Able to Participate in Assessment/Interview: ?Yes ? ?Patient Presentation: ?Responsive ? ?Reason for Admission (Per Patient): ?Active Symptoms, Substance Abuse ? ?Patient Stressors: ?  ? ?Coping Skills:   ?Substance Abuse, Talk, Prayer ? ?Leisure Interests (2+):  ?[place], Social - Family, Social - Friends ? ?Frequency of Recreation/Participation: ?Weekly ? ?Awareness of Community Resources:  ?Yes ? ?Community Resources:  ?[place] ? ?Current Use: ?Yes ? ?If no, Barriers?: ?  ? ?Expressed Interest in [employer] Information: ?Yes ? ?[place] of Residence:  ?[place] ? ?Patient Main Form of Transportation: ?Car ? ?Patient Strengths:  ?N/A ? ?Patient Identified Areas of Improvement:  ?Do my best ? ?Patient Goal for Hospitalization:  ?Change my life. Find a way to quit my addiction ? ?Current SI (including self-harm):  ?No ? ?Current HI:  ?No ? ?Current AVH: ?No ? ?Staff Intervention Plan: ?Collaborate with Interdisciplinary Treatment Team, Group Attendance ? ?Consent to Intern Participation: ?N/A ? ?Ivan Hamilton ?10/20/2021, 4:01 PM ?

## 2021-10-20 NOTE — Progress Notes (Signed)
Patient's goals for his hospital stay is "I really want to change my life and find a way to quit my addiction. I want to do my best to try and quit forever". ?

## 2021-10-20 NOTE — Progress Notes (Signed)
Recreation Therapy Notes ? ? ?Date: 10/20/2021 ? ?Time: 11:00am    ? ?Location: Craft room  ? ?Behavioral response: N/A ?  ?Intervention Topic: Strengths  ? ?Discussion/Intervention: ?Patient refused to attend group.  ? ?Clinical Observations/Feedback:  ?Patient refused to attend group.  ?  ?Damia Bobrowski LRT/CTRS ? ? ? ? ? ? ? ?Catrina Fellenz ?10/20/2021 12:10 PM ?

## 2021-10-20 NOTE — Progress Notes (Signed)
Patient Synthroid this morning. CIWA = 1  Will continue to monitor. Encouraged patient to seek staff in change is status occurs.  ? ? ?C Butler-Nicholson, LPN ?

## 2021-10-20 NOTE — Progress Notes (Signed)
Recreation Therapy Notes ? ?INPATIENT RECREATION TR PLAN ? ?Patient Details ?Name: Ivan Hamilton ?MRN: [id number] ?DOB: Jun 27, 1966 ?Today's Date: 10/20/2021 ? ?Rec Therapy Plan ?Is patient appropriate for Therapeutic Recreation?: Yes ?Treatment times per week: at least 3 ?Estimated Length of Stay: 5-7 days ?TR Treatment/Interventions: Group participation (Comment) ? ?Discharge Criteria ?Pt will be discharged from therapy if:: Discharged ?Treatment plan/goals/alternatives discussed and agreed upon by:: Patient/family ? ?Discharge Summary ?  ? ? ?Courtez Twaddle ?10/20/2021, 4:02 PM ?

## 2021-10-20 NOTE — Progress Notes (Signed)
[facility] MD Progress Note ? ?10/20/2021 2:26 PM ?Arthor Captain  ?MRN:  [id number] ?Subjective: Patient came to treatment team today.  Still a little bit shaky but vitals are stable.  No sign of delirium.  Anxious and depressed mood no acute suicidal ideation but still feels nervous and unsure about himself was discharged cooperative.  Complains of his chronic back issues ?Principal Problem: Chronic post-traumatic stress disorder (PTSD) after military combat ?Diagnosis: Principal Problem: ?  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  Hypothyroid ?  Alcohol withdrawal ([place]) ?  Alcohol use disorder, severe, dependence ([place]) ? ?Total Time spent with patient: 30 minutes ? ?Past Psychiatric History: Past history of longstanding PTSD and alcohol abuse ? ?Past Medical History:  ?Past Medical History:  ?Diagnosis Date  ? Alcohol withdrawal ([place])   ? B12 deficiency   ? Chronic pain   ? CVA (cerebral vascular accident) [facility])   ? Hypertension   ? Hypothyroidism   ? Varicose veins   ?  ?Past Surgical History:  ?Procedure Laterality Date  ? CHOLECYSTECTOMY    ? COLONOSCOPY WITH PROPOFOL N/A 03/22/2021  ? Procedure: COLONOSCOPY WITH PROPOFOL;  Surgeon: Toledo, Benay Pike, MD;  Location: [facility] ENDOSCOPY;  Service: Gastroenterology;  Laterality: N/A;  SPANISH INTERPRETER  ? ESOPHAGOGASTRODUODENOSCOPY N/A 03/22/2021  ? Procedure: ESOPHAGOGASTRODUODENOSCOPY (EGD);  Surgeon: Toledo, Benay Pike, MD;  Location: [facility] ENDOSCOPY;  Service: Gastroenterology;  Laterality: N/A;  ? ESOPHAGOGASTRODUODENOSCOPY (EGD) WITH PROPOFOL N/A 10/24/2017  ? Procedure: ESOPHAGOGASTRODUODENOSCOPY (EGD) WITH PROPOFOL;  Surgeon: [name], MD;  Location: [facility] ENDOSCOPY;  Service: Endoscopy;  Laterality: N/A;  ? ESOPHAGOGASTRODUODENOSCOPY (EGD) WITH PROPOFOL N/A 07/12/2019  ? Procedure: ESOPHAGOGASTRODUODENOSCOPY (EGD) WITH PROPOFOL;  Surgeon: Jonathon Bellows, MD;  Location: [facility] ENDOSCOPY;  Service: Gastroenterology;   Laterality: N/A;  ? LEG SURGERY    ? ?Family History: History reviewed. No pertinent family history. ?Family Psychiatric  History: See previous.  Alcohol abuse in family. ?Social History:  ?Social History  ? ?Substance and Sexual Activity  ?Alcohol Use Yes  ? Alcohol/week: 21.0 standard drinks  ? Types: 21 Shots of liquor per week  ? Comment: daily- 1 pint of vodka  ?   ?Social History  ? ?Substance and Sexual Activity  ?Drug Use No  ?  ?Social History  ? ?Socioeconomic History  ? Marital status: Married  ?  Spouse name: Not on file  ? Number of children: Not on file  ? Years of education: Not on file  ? Highest education level: Not on file  ?Occupational History  ? Not on file  ?Tobacco Use  ? Smoking status: Never  ? Smokeless tobacco: Never  ?Vaping Use  ? Vaping Use: Never used  ?Substance and Sexual Activity  ? Alcohol use: Yes  ?  Alcohol/week: 21.0 standard drinks  ?  Types: 21 Shots of liquor per week  ?  Comment: daily- 1 pint of vodka  ? Drug use: No  ? Sexual activity: Not on file  ?Other Topics Concern  ? Not on file  ?Social History Narrative  ? Not on file  ? ?Social Determinants of Health  ? ?Financial Resource Strain: Not on file  ?Food Insecurity: Not on file  ?Transportation Needs: Not on file  ?Physical Activity: Not on file  ?Stress: Not on file  ?Social Connections: Not on file  ? ?Additional Social History:  ?  ?  ?  ?  ?  ?  ?  ?  ?  ?  ?  ? ?  Sleep: Fair ? ?Appetite:  Fair ? ?Current Medications: ?Current Facility-Administered Medications  ?Medication Dose Route Frequency Provider Last Rate Last Admin  ? acetaminophen (TYLENOL) tablet 650 mg  650 mg Oral Q6H PRN Sherlon Handing, NP   650 mg at 10/20/21 1222  ? alum & mag hydroxide-simeth (MAALOX/MYLANTA) 200-200-20 MG/5ML suspension 30 mL  30 mL Oral Q4H PRN Waldon Merl F, NP      ? busPIRone (BUSPAR) tablet 10 mg  10 mg Oral TID Khoi Hamberger, Madie Reno, MD   10 mg at 10/20/21 1218  ? FLUoxetine (PROZAC) capsule 20 mg  20 mg Oral Daily  Obinna Ehresman, Madie Reno, MD   20 mg at 10/20/21 7782  ? gabapentin (NEURONTIN) capsule 800 mg  800 mg Oral TID Estella Malatesta T, MD   800 mg at 10/20/21 1218  ? hydrOXYzine (ATARAX) tablet 25 mg  25 mg Oral Q6H PRN Waldon Merl F, NP      ? ibuprofen (ADVIL) tablet 600 mg  600 mg Oral Q6H PRN Kyl Givler T, MD      ? levothyroxine (SYNTHROID) tablet 125 mcg  125 mcg Oral Q0600 Clyde Upshaw, Madie Reno, MD   125 mcg at 10/20/21 4235  ? lisinopril (ZESTRIL) tablet 5 mg  5 mg Oral Daily Trenden Hazelrigg, Madie Reno, MD   5 mg at 10/20/21 3614  ? loperamide (IMODIUM) capsule 2-4 mg  2-4 mg Oral PRN Sherlon Handing, NP      ? LORazepam (ATIVAN) tablet 1 mg  1 mg Oral Q6H PRN Sherlon Handing, NP      ? LORazepam (ATIVAN) tablet 1 mg  1 mg Oral BID Sherlon Handing, NP      ? Followed by  ? Derrill Memo ON 10/22/2021] LORazepam (ATIVAN) tablet 1 mg  1 mg Oral Daily Waldon Merl F, NP      ? magnesium hydroxide (MILK OF MAGNESIA) suspension 30 mL  30 mL Oral Daily PRN Sherlon Handing, NP      ? multivitamin with minerals tablet 1 tablet  1 tablet Oral Daily Sherlon Handing, NP   1 tablet at 10/20/21 0827  ? ondansetron (ZOFRAN-ODT) disintegrating tablet 4 mg  4 mg Oral Q6H PRN Waldon Merl F, NP      ? pantoprazole (PROTONIX) EC tablet 40 mg  40 mg Oral Daily Stepanie Graver, Madie Reno, MD   40 mg at 10/20/21 4315  ? prazosin (MINIPRESS) capsule 1 mg  1 mg Oral QHS Jodine Muchmore, Madie Reno, MD   1 mg at 10/19/21 2106  ? thiamine tablet 100 mg  100 mg Oral Daily Waldon Merl F, NP   100 mg at 10/20/21 4008  ? ? ?Lab Results: No results found for this or any previous visit (from the past 48 hour(s)). ? ?Blood Alcohol level:  ?Lab Results  ?Component Value Date  ? ETH 423 (HH) 10/17/2021  ? ETH 252 (H) 05/24/2021  ? ? ?Metabolic Disorder Labs: ?Lab Results  ?Component Value Date  ? HGBA1C 5.3 11/08/2020  ? MPG 105.41 11/08/2020  ? MPG 105 05/12/2020  ? ?No results found for: PROLACTIN ?Lab Results  ?Component Value Date  ? CHOL 167 11/08/2020  ? TRIG 89  11/08/2020  ? HDL 90 11/08/2020  ? CHOLHDL 1.9 11/08/2020  ? VLDL 18 11/08/2020  ? [place] 59 11/08/2020  ? LDLCALC 103 (H) 05/12/2020  ? ? ?Physical Findings: ?AIMS:  , ,  ,  ,    ?CIWA:  CIWA-Ar Total: 0 ?COWS:    ? ?  Musculoskeletal: ?Strength & Muscle Tone: within normal limits ?Gait & Station: normal ?Patient leans: N/A ? ?Psychiatric Specialty Exam: ? ?Presentation  ?General Appearance: Appropriate for Environment ? ?Eye Contact:Good ? ?Speech:Clear and Coherent ? ?Speech Volume:Normal ? ?Handedness:Right ? ? ?Mood and Affect  ?Mood:Depressed; Hopeless ? ?Affect:Congruent ? ? ?Thought Process  ?Thought Processes:Coherent ? ?Descriptions of Associations:Intact ? ?Orientation:Full (Time, Place and Person) ? ?Thought Content:Logical ? ?History of Schizophrenia/Schizoaffective disorder:No ? ?Duration of Psychotic Symptoms:Greater than six months ? ?Hallucinations:No data recorded ?Ideas of Reference:None ? ?Suicidal Thoughts:No data recorded ?Homicidal Thoughts:No data recorded ? ?Sensorium  ?Memory:Immediate Good; Recent Poor ? ?Judgment:Poor ? ?Insight:Fair ? ? ?Executive Functions  ?Concentration:Fair ? ?Attention Span:Good ? ?Recall:Fair ? ?Fund of [place] ? ?Language:Fair ? ? ?Psychomotor Activity  ?Psychomotor Activity:No data recorded ? ?Assets  ?Assets:Communication Skills; Desire for Improvement; Housing; Resilience; Social Support ? ? ?Sleep  ?Sleep:No data recorded ? ? ?Physical Exam: ?Physical Exam ?Vitals and nursing note reviewed.  ?Constitutional:   ?   Appearance: Normal appearance.  ?HENT:  ?   Head: Normocephalic and atraumatic.  ?   Mouth/Throat:  ?   Pharynx: Oropharynx is clear.  ?Eyes:  ?   Pupils: Pupils are equal, round, and reactive to light.  ?Cardiovascular:  ?   Rate and Rhythm: Normal rate and regular rhythm.  ?Pulmonary:  ?   Effort: Pulmonary effort is normal.  ?   Breath sounds: Normal breath sounds.  ?Abdominal:  ?   General: Abdomen is flat.  ?   Palpations: Abdomen is soft.   ?Musculoskeletal:     ?   General: Normal range of motion.  ?Skin: ?   General: Skin is warm and dry.  ?Neurological:  ?   General: No focal deficit present.  ?   Mental Status: He is alert. Mental status is at b

## 2021-10-20 NOTE — Group Note (Signed)
[facility] LCSW Group Therapy Note ? ? ?Group Date: 10/20/2021 ?Start Time: 1300 ?End Time: 1400 ? ?Type of Therapy and Topic:  Group Therapy:  Feelings around Relapse and Recovery ? ?Participation Level:  Active  ? ? ?Description of Group:   ? Patients in this group will discuss emotions they experience before and after a relapse. They will process how experiencing these feelings, or avoidance of experiencing them, relates to having a relapse. Facilitator will guide patients to explore emotions they have related to recovery. Patients will be encouraged to process which emotions are more powerful. They will be guided to discuss the emotional reaction significant others in their lives may have to patients? relapse or recovery. Patients will be assisted in exploring ways to respond to the emotions of others without this contributing to a relapse. ? ?Therapeutic Goals: ?Patient will identify two or more emotions that lead to relapse for them:  ?Patient will identify two emotions that result when they relapse:  ?Patient will identify two emotions related to recovery:  ?Patient will demonstrate ability to communicate their needs through discussion and/or role plays. ? ? ?Summary of Patient Progress: ?Patient was present for the entirety of the group session. Patient was an active listener and participated in the topic of discussion, provided helpful advice to others, and added nuance to topic of conversation. Patient shared that he started drinking at the age of 83 when his father would send him to recycle his beer bottles, some of which still had beer at the bottle. Patient also shared he has difficulty being around others drinking.  ? ? ?Therapeutic Modalities:   ?Cognitive Behavioral Therapy ?Solution-Focused Therapy ?Assertiveness Training ?Relapse Prevention Therapy ? ? ?Durenda Hurt, LCSWA ?

## 2021-10-20 NOTE — Plan of Care (Signed)
?  Problem: Education: ?Goal: Knowledge of [facility] information/materials will improve ?Outcome: Progressing ?Goal: Emotional status will improve ?Outcome: Progressing ?Goal: Mental status will improve ?Outcome: Progressing ?Goal: Verbalization of understanding the information provided will improve ?Outcome: Progressing ?  ?Problem: Safety: ?Goal: Periods of time without injury will increase ?Outcome: Progressing ?  ?Problem: Education: ?Goal: Utilization of techniques to improve thought processes will improve ?Outcome: Progressing ?Goal: Knowledge of the prescribed therapeutic regimen will improve ?Outcome: Progressing ?  ?Problem: Safety: ?Goal: Ability to disclose and discuss suicidal ideas will improve ?Outcome: Progressing ?Goal: Ability to identify and utilize support systems that promote safety will improve ?Outcome: Progressing ?  ?

## 2021-10-20 NOTE — Progress Notes (Signed)
Patient was pleasant and cooperative. He has been active on the unit hanging out with his peers in the day room. He is med compliant and received his QHS meds without incident. CIWA -1 at this encounter. He denies si hi avh at this encounter. He does endorse mild anxiety and depression.  Will continue to monitor with q15 safety checks. Encouraged him to seek staff woith any concerns he may have. ? ? ? ?C Butler-Nicholson, LPN ? ? ?

## 2021-10-21 MED ORDER — QUETIAPINE FUMARATE 25 MG PO TABS
50.0000 mg | ORAL_TABLET | Freq: Every day | ORAL | Status: DC
Start: 2021-10-21 — End: 2021-10-24
  Administered 2021-10-21 – 2021-10-23 (×3): 50 mg via ORAL
  Filled 2021-10-21 (×3): qty 2

## 2021-10-21 MED ORDER — PRAZOSIN HCL 2 MG PO CAPS
2.0000 mg | ORAL_CAPSULE | Freq: Every day | ORAL | Status: DC
  Administered 2021-10-21 – 2021-10-23 (×3): 2 mg via ORAL
  Filled 2021-10-21 (×3): qty 1

## 2021-10-21 MED ORDER — FLUOXETINE HCL 10 MG PO CAPS
30.0000 mg | ORAL_CAPSULE | Freq: Every day | ORAL | Status: DC
  Administered 2021-10-22 – 2021-10-24 (×3): 30 mg via ORAL
  Filled 2021-10-21 (×4): qty 1

## 2021-10-21 NOTE — Progress Notes (Signed)
Patient alert and oriented x 4, affect is flat but brightens upon approach he is on CIWA no S/S of withdrawal he is interacting appropriately VS WNL. He denies SI/HI/AVH 15 minutes safety checks maintained will continue to monitor.  ?

## 2021-10-21 NOTE — Progress Notes (Signed)
Patient has stayed active in the milieu. Attended groups and maintained a pleasant attitude, reporting that "this medication is really helping me....". Had no sign of distress throughout this shift.  ?

## 2021-10-21 NOTE — Plan of Care (Signed)
Patient is active in the milieu, pleasant and cooperative on approach. Alert and oriented x4 and  denying suicidal thoughts. Patient ate breakfast and received AM medications. Has no withdrawal symptoms at present. Encouragements and support provided. Safety monitored per unit protocol.  ?

## 2021-10-21 NOTE — Progress Notes (Addendum)
[facility] MD Progress Note ? ?10/21/2021 11:14 AM ?Ivan Hamilton  ?MRN:  [id number] ?Subjective:  Patient alert and oriented x 4, affect is flat but brightens upon approach he is on CIWA no S/S of withdrawal he is interacting appropriately VS WNL.  ?Patient continues to feel significantly depressed, anxious, tearful and has nightmares. He is also reporting AVH of his [place] peers and has flashbacks. He had difficulty sleeping last night and has passive SI.  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  Hypothyroid ?  Alcohol withdrawal ([place]) ?  Alcohol use disorder, severe, dependence ([place]) ? ?Total Time spent with patient: 30 minutes ? ?Past Psychiatric History: PTSD, Alcohol abuse ? ?Past Medical History:  ?Past Medical History:  ?Diagnosis Date  ? Alcohol withdrawal ([place])   ? B12 deficiency   ? Chronic pain   ? CVA (cere[facility] vascular accident) [facility])   ? Hypertension   ? Hypothyroidism   ? Varicose veins   ?  ?Past Surgical History:  ?Procedure Laterality Date  ? CHOLECYSTECTOMY    ? COLONOSCOPY WITH PROPOFOL N/A 03/22/2021  ? Procedure: COLONOSCOPY WITH PROPOFOL;  Surgeon: Toledo, Benay Pike, MD;  Location: [facility] ENDOSCOPY;  Service: Gastroenterology;  Laterality: N/A;  SPANISH INTERPRETER  ? ESOPHAGOGASTRODUODENOSCOPY N/A 03/22/2021  ? Procedure: ESOPHAGOGASTRODUODENOSCOPY (EGD);  Surgeon: Toledo, Benay Pike, MD;  Location: [facility] ENDOSCOPY;  Service: Gastroenterology;  Laterality: N/A;  ? ESOPHAGOGASTRODUODENOSCOPY (EGD) WITH PROPOFOL N/A 10/24/2017  ? Procedure: ESOPHAGOGASTRODUODENOSCOPY (EGD) WITH PROPOFOL;  Surgeon: [name], MD;  Location: [facility] ENDOSCOPY;  Service: Endoscopy;  Laterality: N/A;  ? ESOPHAGOGASTRODUODENOSCOPY (EGD) WITH PROPOFOL N/A 07/12/2019  ? Procedure: ESOPHAGOGASTRODUODENOSCOPY (EGD) WITH PROPOFOL;  Surgeon: Jonathon Bellows, MD;   Location: [facility] ENDOSCOPY;  Service: Gastroenterology;  Laterality: N/A;  ? LEG SURGERY    ? ?Family History: History reviewed. No pertinent family history. ?Family Psychiatric  History:  ?Social History:  ?Social History  ? ?Substance and Sexual Activity  ?Alcohol Use Yes  ? Alcohol/week: 21.0 standard drinks  ? Types: 21 Shots of liquor per week  ? Comment: daily- 1 pint of vodka  ?   ?Social History  ? ?Substance and Sexual Activity  ?Drug Use No  ?  ?Social History  ? ?Socioeconomic History  ? Marital status: Married  ?  Spouse name: Not on file  ? Number of children: Not on file  ? Years of education: Not on file  ? Highest education level: Not on file  ?Occupational History  ? Not on file  ?Tobacco Use  ? Smoking status: Never  ? Smokeless tobacco: Never  ?Vaping Use  ? Vaping Use: Never used  ?Substance and Sexual Activity  ? Alcohol use: Yes  ?  Alcohol/week: 21.0 standard drinks  ?  Types: 21 Shots of liquor per week  ?  Comment: daily- 1 pint of vodka  ? Drug use: No  ? Sexual activity: Not on file  ?Other Topics Concern  ? Not on file  ?Social History Narrative  ? Not on file  ? ?Social Determinants of Health  ? ?Financial Resource Strain: Not on file  ?Food Insecurity: Not on file  ?Transportation Needs: Not on file  ?Physical Activity: Not on file  ?Stress: Not on file  ?Social Connections: Not on file  ? ?Additional Social History:  ?  ?  ?  ?  ?  ?  ?  ?  ?  ?  ?  ? ?  Sleep: Fair ? ?Appetite:  Fair ? ?Current Medications: ?Current Facility-Administered Medications  ?Medication Dose Route Frequency Provider Last Rate Last Admin  ? acetaminophen (TYLENOL) tablet 650 mg  650 mg Oral Q6H PRN Sherlon Handing, NP   650 mg at 10/20/21 1222  ? alum & mag hydroxide-simeth (MAALOX/MYLANTA) 200-200-20 MG/5ML suspension 30 mL  30 mL Oral Q4H PRN Waldon Merl F, NP      ? busPIRone (BUSPAR) tablet 10 mg  10 mg Oral TID Clapacs, Madie Reno, MD   10 mg at 10/21/21 0272  ? FLUoxetine (PROZAC) capsule 20 mg  20 mg  Oral Daily Clapacs, Madie Reno, MD   20 mg at 10/21/21 5366  ? gabapentin (NEURONTIN) capsule 800 mg  800 mg Oral TID Clapacs, John T, MD   800 mg at 10/21/21 4403  ? hydrOXYzine (ATARAX) tablet 25 mg  25 mg Oral Q6H PRN Waldon Merl F, NP   25 mg at 10/20/21 2108  ? ibuprofen (ADVIL) tablet 600 mg  600 mg Oral Q6H PRN Clapacs, Madie Reno, MD   600 mg at 10/21/21 4742  ? levothyroxine (SYNTHROID) tablet 125 mcg  125 mcg Oral Q0600 Clapacs, Madie Reno, MD   125 mcg at 10/21/21 5956  ? lisinopril (ZESTRIL) tablet 5 mg  5 mg Oral Daily Clapacs, Madie Reno, MD   5 mg at 10/21/21 3875  ? loperamide (IMODIUM) capsule 2-4 mg  2-4 mg Oral PRN Sherlon Handing, NP      ? LORazepam (ATIVAN) tablet 1 mg  1 mg Oral Q6H PRN Sherlon Handing, NP   1 mg at 10/21/21 6433  ? [START ON 10/22/2021] LORazepam (ATIVAN) tablet 1 mg  1 mg Oral Daily Waldon Merl F, NP      ? magnesium hydroxide (MILK OF MAGNESIA) suspension 30 mL  30 mL Oral Daily PRN Sherlon Handing, NP      ? multivitamin with minerals tablet 1 tablet  1 tablet Oral Daily Sherlon Handing, NP   1 tablet at 10/21/21 2951  ? ondansetron (ZOFRAN-ODT) disintegrating tablet 4 mg  4 mg Oral Q6H PRN Waldon Merl F, NP      ? pantoprazole (PROTONIX) EC tablet 40 mg  40 mg Oral Daily Clapacs, Madie Reno, MD   40 mg at 10/21/21 8841  ? prazosin (MINIPRESS) capsule 1 mg  1 mg Oral QHS Clapacs, John T, MD   1 mg at 10/20/21 2109  ? thiamine tablet 100 mg  100 mg Oral Daily Waldon Merl F, NP   100 mg at 10/21/21 6606  ? ? ?Lab Results: No results found for this or any previous visit (from the past 48 hour(s)). ? ?Blood Alcohol level:  ?Lab Results  ?Component Value Date  ? ETH 423 (HH) 10/17/2021  ? ETH 252 (H) 05/24/2021  ? ? ?Metabolic Disorder Labs: ?Lab Results  ?Component Value Date  ? HGBA1C 5.3 11/08/2020  ? MPG 105.41 11/08/2020  ? MPG 105 05/12/2020  ? ?No results found for: PROLACTIN ?Lab Results  ?Component Value Date  ? CHOL 167 11/08/2020  ? TRIG 89 11/08/2020  ?  HDL 90 11/08/2020  ? CHOLHDL 1.9 11/08/2020  ? VLDL 18 11/08/2020  ? [place] 59 11/08/2020  ? LDLCALC 103 (H) 05/12/2020  ? ? ?Physical Findings: ?AIMS:  , ,  ,  ,    ?CIWA:  CIWA-Ar Total: 0 ?COWS:    ? ?Musculoskeletal: ?Strength & Muscle Tone: within normal limits ?Gait & Station: normal ?Patient  leans: Front ? ?Psychiatric Specialty Exam: ? ?Presentation  ?General Appearance: Appropriate for Environment ? ?Eye Contact:Good ? ?Speech:Clear and Coherent ? ?Speech Volume:Normal ? ?Handedness:Right ? ? ?Mood and Affect  ?Mood:Depressed; Hopeless ? ?Affect:Congruent ? ? ?Thought Process  ?Thought Processes:Coherent ? ?Descriptions of Associations:Intact ? ?Orientation:Full (Time, Place and Person) ? ?Thought Content:Logical ? ?History of Schizophrenia/Schizoaffective disorder:No ? ?Duration of Psychotic Symptoms:Greater than six months ? ?Hallucinations:No data recorded ?Ideas of Reference:None ? ?Suicidal Thoughts:No data recorded ?Homicidal Thoughts:No data recorded ? ?Sensorium  ?Memory:Immediate Good; Recent Poor ? ?Judgment:Poor ? ?Insight:Fair ? ? ?Executive Functions  ?Concentration:Fair ? ?Attention Span:Good ? ?Recall:Fair ? ?Fund of [place] ? ?Language:Fair ? ? ?Psychomotor Activity  ?Psychomotor Activity:No data recorded ? ?Assets  ?Assets:Communication Skills; Desire for Improvement; Housing; Resilience; Social Support ? ? ?Sleep  ?Sleep:No data recorded ? ? ?Physical Exam: ?Physical Exam ?Constitutional:   ?   Appearance: Normal appearance. He is normal weight.  ?HENT:  ?   Head: Normocephalic and atraumatic.  ?   Right Ear: Tympanic membrane normal.  ?   Left Ear: Tympanic membrane normal.  ?   Nose: Nose normal.  ?   Mouth/Throat:  ?   Mouth: Mucous membranes are moist.  ?   Pharynx: Oropharynx is clear.  ?Eyes:  ?   Conjunctiva/sclera: Conjunctivae normal.  ?   Pupils: Pupils are equal, round, and reactive to light.  ?Cardiovascular:  ?   Rate and Rhythm: Normal rate and regular rhythm.  ?    Pulses: Normal pulses.  ?   Heart sounds: Normal heart sounds.  ?Pulmonary:  ?   Effort: Pulmonary effort is normal.  ?   Breath sounds: Normal breath sounds.  ?Abdominal:  ?   General: Abdomen is flat. Bowel soun

## 2021-10-21 NOTE — Group Note (Signed)
LCSW Group Therapy Note ? ?Group Date: 10/21/2021 ?Start Time: 1300 ?End Time: 1400 ? ? ?Type of Therapy and Topic:  Group Therapy - Healthy vs Unhealthy Coping Skills ? ?Participation Level:  Active  ? ?Description of Group ?The focus of this group was to determine what unhealthy coping techniques typically are used by group members and what healthy coping techniques would be helpful in coping with various problems. Patients were guided in becoming aware of the differences between healthy and unhealthy coping techniques. Patients were asked to identify 2-3 healthy coping skills they would like to learn to use more effectively. ? ?Therapeutic Goals ?Patients learned that coping is what human beings do all day long to deal with various situations in their lives ?Patients defined and discussed healthy vs unhealthy coping techniques ?Patients identified their preferred coping techniques and identified whether these were healthy or unhealthy ?Patients determined 2-3 healthy coping skills they would like to become more familiar with and use more often. ?Patients provided support and ideas to each other ? ? ?Summary of Patient Progress: Patient was present for the entirety of the group session. Patient was an active listener and participated in the topic of discussion, provided helpful advice to others, and added nuance to topic of conversation. Patient reflected on how his experience in the [place] has helped him build resilience. Patient identified how he has used walking away as a healthy coping skill during arguments in the past. Patient processed how walking away can be viewed as both a negative and positive action and discussed nuances regarding when it is an appropriate behavior. Patient identified spending time outside, washing cars, and listening to music as healthy coping skills he is looking forward to practicing as the weather gets warmer.  ? ? ? ?Therapeutic Modalities ?Cognitive Behavioral  Therapy ?Motivational Interviewing ? ?Kenna Gilbert Sumner, LCSWA ?10/21/2021  4:34 PM   ?

## 2021-10-22 NOTE — Progress Notes (Signed)
Patient voiced to this writer that his depression is increasing and he doesn't feel like the medication is working. He also stated that the depression is making him have self-harm thoughts. Patient contracted for safety with this Probation officer and stated that if anything changes, he will come to staff. MD will be notified. ?

## 2021-10-22 NOTE — Group Note (Signed)
[facility] LCSW Group Therapy Note ? ? ?Group Date: 10/22/2021 ?Start Time: 1300 ?End Time: 1400 ? ? ?Type of Therapy and Topic: Group Therapy: Avoiding Self-Sabotaging and Enabling Behaviors ? ?Participation Level: Did Not Attend ? ?Mood: ? ?Description of Group:  ?In this group, patients will learn how to identify obstacles, self-sabotaging and enabling behaviors, as well as: what are they, why do we do them and what needs these behaviors meet. Discuss unhealthy relationships and how to have positive healthy boundaries with those that sabotage and enable. Explore aspects of self-sabotage and enabling in yourself and how to limit these self-destructive behaviors in everyday life. ? ? ?Therapeutic Goals: ?1. Patient will identify one obstacle that relates to self-sabotage and enabling behaviors ?2. Patient will identify one personal self-sabotaging or enabling behavior they did prior to admission ?3. Patient will state a plan to change the above identified behavior ?4. Patient will demonstrate ability to communicate their needs through discussion and/or role play.  ? ? ?Summary of Patient Progress: Due to limited staffing, group was not held on the unit.  ? ? ? ? ?Therapeutic Modalities:  ?Cognitive Behavioral Therapy ?Person-Centered Therapy ?Motivational Interviewing ? ? ? ?Kenna Gilbert Lott Seelbach, LCSWA ?

## 2021-10-22 NOTE — Progress Notes (Signed)
This Probation officer notified MD of these changes via secure chat, and was informed that: "I did increase his Prozac dose and add Seroquel; hopefully it'll be better for him in next few days. He looked better today than yesterday, though CO increased depression". ?

## 2021-10-22 NOTE — Progress Notes (Incomplete)
[facility] MD Progress Note ? 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  Hypothyroid ?  Alcohol withdrawal ([place]) ?  Alcohol use disorder, severe, dependence ([place]) ? ?Total Time spent with patient: {Time; 15 min - 8 hours:17441} ? ?Past Psychiatric History: *** ? ?Past Medical History:  ?Past Medical History:  ?Diagnosis Date  ?? Alcohol withdrawal ([place])   ?? B12 deficiency   ?? Chronic pain   ?? CVA (cerebral vascular accident) ([place])   ?? Hypertension   ?? Hypothyroidism   ?? Varicose veins   ?  ?Past Surgical History:  ?Procedure Laterality Date  ?? CHOLECYSTECTOMY    ?? COLONOSCOPY WITH PROPOFOL N/A 03/22/2021  ? Procedure: COLONOSCOPY WITH PROPOFOL;  Surgeon: Toledo, Benay Pike, MD;  Location: [facility] ENDOSCOPY;  Service: Gastroenterology;  Laterality: N/A;  SPANISH INTERPRETER  ?? ESOPHAGOGASTRODUODENOSCOPY N/A 03/22/2021  ? Procedure: ESOPHAGOGASTRODUODENOSCOPY (EGD);  Surgeon: Toledo, Benay Pike, MD;  Location: [facility] ENDOSCOPY;  Service: Gastroenterology;  Laterality: N/A;  ?? ESOPHAGOGASTRODUODENOSCOPY (EGD) WITH PROPOFOL N/A 10/24/2017  ? Procedure: ESOPHAGOGASTRODUODENOSCOPY (EGD) WITH PROPOFOL;  Surgeon: [name], MD;  Location: [facility] ENDOSCOPY;  Service: Endoscopy;  Laterality: N/A;  ?? ESOPHAGOGASTRODUODENOSCOPY (EGD) WITH PROPOFOL N/A 07/12/2019  ? Procedure: ESOPHAGOGASTRODUODENOSCOPY (EGD) WITH PROPOFOL;  Surgeon: Jonathon Bellows, MD;  Location: [facility] ENDOSCOPY;  Service: Gastroenterology;  Laterality: N/A;  ?? LEG SURGERY    ? ?Family History: History reviewed. No pertinent family history. ?Family Psychiatric  History: *** ?Social History:  ?Social History  ? ?Substance and Sexual Activity  ?Alcohol Use Yes  ?? Alcohol/week: 21.0 standard drinks  ?? Types: 21 Shots of liquor per  week  ? Comment: daily- 1 pint of vodka  ?   ?Social History  ? ?Substance and Sexual Activity  ?Drug Use No  ?  ?Social History  ? ?Socioeconomic History  ?? Marital status: Married  ?  Spouse name: Not on file  ?? Number of children: Not on file  ?? Years of education: Not on file  ?? Highest education level: Not on file  ?Occupational History  ?? Not on file  ?Tobacco Use  ?? Smoking status: Never  ?? Smokeless tobacco: Never  ?Vaping Use  ?? Vaping Use: Never used  ?Substance and Sexual Activity  ?? Alcohol use: Yes  ?  Alcohol/week: 21.0 standard drinks  ?  Types: 21 Shots of liquor per week  ?  Comment: daily- 1 pint of vodka  ?? Drug use: No  ?? Sexual activity: Not on file  ?Other Topics Concern  ?? Not on file  ?Social History Narrative  ?? Not on file  ? ?Social Determinants of Health  ? ?Financial Resource Strain: Not on file  ?Food Insecurity: Not on file  ?Transportation Needs: Not on file  ?Physical Activity: Not on file  ?Stress: Not on file  ?Social Connections: Not on file  ? ?Additional Social History:  ?  ?  ?  ?  ?  ?  ?  ?  ?  ?  ?  ? ?Sleep: {BHH GOOD/FAIR/POOR:22877} ? ?Appetite:  {BHH GOOD/FAIR/POOR:22877} ? ?Current Medications: ?Current Facility-Administered Medications  ?Medication Dose Route Frequency Provider Last Rate Last Admin  ?? acetaminophen (TYLENOL) tablet 650 mg  650 mg Oral Q6H PRN Sherlon Handing, NP   650 mg at 10/20/21 1222  ?? alum & mag hydroxide-simeth (  MAALOX/MYLANTA) 200-200-20 MG/5ML suspension 30 mL  30 mL Oral Q4H PRN Waldon Merl F, NP      ?? busPIRone (BUSPAR) tablet 10 mg  10 mg Oral TID Clapacs, Madie Reno, MD   10 mg at 10/22/21 0838  ?? FLUoxetine (PROZAC) capsule 30 mg  30 mg Oral Daily Daphanie Oquendo   30 mg at 10/22/21 3419  ?? gabapentin (NEURONTIN) capsule 800 mg  800 mg Oral TID Clapacs, Madie Reno, MD   800 mg at 10/22/21 0839  ?? ibuprofen (ADVIL) tablet 600 mg  600 mg Oral Q6H PRN Clapacs, Madie Reno, MD   600 mg at 10/21/21 2120  ?? levothyroxine  (SYNTHROID) tablet 125 mcg  125 mcg Oral Q0600 Clapacs, Madie Reno, MD   125 mcg at 10/22/21 0644  ?? lisinopril (ZESTRIL) tablet 5 mg  5 mg Oral Daily Clapacs, Madie Reno, MD   5 mg at 10/22/21 0838  ?? magnesium hydroxide (MILK OF MAGNESIA) suspension 30 mL  30 mL Oral Daily PRN Sherlon Handing, NP      ?? multivitamin with minerals tablet 1 tablet  1 tablet Oral Daily Sherlon Handing, NP   1 tablet at 10/22/21 6222  ?? pantoprazole (PROTONIX) EC tablet 40 mg  40 mg Oral Daily Clapacs, Madie Reno, MD   40 mg at 10/22/21 0839  ?? prazosin (MINIPRESS) capsule 2 mg  2 mg Oral QHS Ender Rorke   2 mg at 10/21/21 2120  ?? QUEtiapine (SEROQUEL) tablet 50 mg  50 mg Oral QHS Christell Steinmiller   50 mg at 10/21/21 2120  ?? thiamine tablet 100 mg  100 mg Oral Daily Waldon Merl F, NP   100 mg at 10/22/21 9798  ? ? ?Lab Results: No results found for this or any previous visit (from the past 48 hour(s)). ? ?Blood Alcohol level:  ?Lab Results  ?Component Value Date  ? ETH 423 (HH) 10/17/2021  ? ETH 252 (H) 05/24/2021  ? ? ?Metabolic Disorder Labs: ?Lab Results  ?Component Value Date  ? HGBA1C 5.3 11/08/2020  ? MPG 105.41 11/08/2020  ? MPG 105 05/12/2020  ? ?No results found for: PROLACTIN ?Lab Results  ?Component Value Date  ? CHOL 167 11/08/2020  ? TRIG 89 11/08/2020  ? HDL 90 11/08/2020  ? CHOLHDL 1.9 11/08/2020  ? VLDL 18 11/08/2020  ? [place] 59 11/08/2020  ? LDLCALC 103 (H) 05/12/2020  ? ? ?Physical Findings: ?AIMS:  , ,  ,  ,    ?CIWA:  CIWA-Ar Total: 0 ?COWS:    ? ?Musculoskeletal: ?Strength & Muscle Tone: {desc; muscle tone:32375} ?Gait & Station: {PE GAIT ED NATL:22525} ?Patient leans: {Patient Leans:[id number]} ? ?Psychiatric Specialty Exam: ? ?Presentation  ?General Appearance: Appropriate for Environment; Disheveled; Neat ? ?Eye Contact:Fair ? ?Speech:Normal Rate ? ?Speech Volume:Decreased ? ?Handedness:Right ? ? ?Mood and Affect  ?Mood:Anxious; Depressed ? ?Affect:Congruent ? ? ?Thought Process  ?Thought  Processes:Coherent ? ?Descriptions of Associations:Intact ? ?Orientation:Full (Time, Place and Person) ? ?Thought Content:Logical ? ?History of Schizophrenia/Schizoaffective disorder:No ? ?Duration of Psychotic Symptoms:Greater than six months ? ?Hallucinations:Hallucinations: Auditory; Visual ?Description of Auditory Hallucinations: voices of his [place] peers ?Description of Visual Hallucinations: people ? ?Ideas of Reference:None ? ?Suicidal Thoughts:Suicidal Thoughts: Yes, Passive ?SI Passive Intent and/or Plan: Without Plan ? ?Homicidal Thoughts:Homicidal Thoughts: No ? ? ?Sensorium  ?Memory:Immediate Fair; Immediate Good; Remote Good ? ?Judgment:Fair ? ?Insight:Fair ? ? ?Executive Functions  ?Concentration:Fair ? ?Attention Span:Fair ? ?Recall:Fair ? ?[place] ? ?Language:Fair ? ? ?Psychomotor  Activity  ?Psychomotor Activity:Psychomotor Activity: Psychomotor Retardation ? ? ?Assets  ?Assets:Communication Skills; Desire for Improvement; Housing; Physical Health ? ? ?Sleep  ?Sleep:No data recorded ? ? ?Physical Exam: ?Physical Exam ?ROS ?Blood pressure 129/89, pulse (!) 120, temperature 97.6 ?F (36.4 ?C), temperature source Oral, resp. rate 18, height '5\' 5"'$  (1.651 m), weight 75.8 kg, SpO2 96 %. Body mass index is 27.79 kg/m?. ? ? ?Treatment Plan Summary: ?{CHL [facility] MD [place]. FRTM:[id number]} ? ?Pamala Hayman ?10/22/2021, 10:02 AM ? ?

## 2021-10-22 NOTE — Plan of Care (Signed)
D- Patient alert and oriented. Patient presented in a sad, but pleasant mood on assessment reporting that he slept poorly last night. Patient stated that he slept on and off all night and he feels more tired today. Patient continues to endorse back pain, rating it a "7/10", however, he stated to this writer that he was going to wait to take any PRN pain medication. Patient endorsed depression, anxiety and hopelessness on his self-inventory. When this writer asked him why he was feeling this way, he became tearful, stating that he is wanting his family to call and ask for him. He doesn't understand why they have not called to see how he's doing. Patient denied SI, HI, AVH at this time. Patient had no stated goals for today. ? ?A- Scheduled medications administered to patient, per MD orders. Support and encouragement provided.  Routine safety checks conducted every 15 minutes.  Patient informed to notify staff with problems or concerns. ? ?R- No adverse drug reactions noted. Patient contracts for safety at this time. Patient compliant with medications and treatment plan. Patient receptive, calm, and cooperative. Patient interacts well with others on the unit.  Patient remains safe at this time. ? ?Problem: Education: ?Goal: Knowledge of [place] [facility] Education information/materials will improve ?Outcome: Not Progressing ?Goal: Emotional status will improve ?Outcome: Not Progressing ?Goal: Mental status will improve ?Outcome: Not Progressing ?Goal: Verbalization of understanding the information provided will improve ?Outcome: Not Progressing ?  ?Problem: Safety: ?Goal: Periods of time without injury will increase ?Outcome: Not Progressing ?  ?Problem: Education: ?Goal: Utilization of techniques to improve thought processes will improve ?Outcome: Not Progressing ?Goal: Knowledge of the prescribed therapeutic regimen will improve ?Outcome: Not Progressing ?  ?Problem: Safety: ?Goal: Ability to disclose and discuss  suicidal ideas will improve ?Outcome: Not Progressing ?Goal: Ability to identify and utilize support systems that promote safety will improve ?Outcome: Not Progressing ?  ?

## 2021-10-22 NOTE — Progress Notes (Addendum)
[facility] MD Progress Note ? ?10/22/2021 10:14 AM ?Ivan Hamilton  ?MRN:  [id number] ?Subjective:  Patient has stayed active in the milieu. Attended groups and maintained a pleasant attitude, reporting that "this medication is really helping me....". Had no sign of distress throughout this shift.  ? Patient presents somewhat brighter and less anxious this morning, yet reports feeling more depressed. He also C/O poor sleep and ongoing nightmares. He is denying current SI/HI or VH while still has AH. He plans to call his family today and is encouraged to come out of his room more.  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  Hypothyroid ?  Alcohol withdrawal ([place]) ?  Alcohol use disorder, severe, dependence ([place]) ? ?Total Time spent with patient: 20 minutes ? ?Past Psychiatric History: PTSD, Alcohol abuse ?  ?Past Medical History:  ?Past Medical History:  ?Diagnosis Date  ? Alcohol withdrawal ([place])   ? B12 deficiency   ? Chronic pain   ? CVA (cerebral vascular accident) [facility])   ? Hypertension   ? Hypothyroidism   ? Varicose veins   ?  ?Past Surgical History:  ?Procedure Laterality Date  ? CHOLECYSTECTOMY    ? COLONOSCOPY WITH PROPOFOL N/A 03/22/2021  ? Procedure: COLONOSCOPY WITH PROPOFOL;  Surgeon: Toledo, Benay Pike, MD;  Location: [facility] ENDOSCOPY;  Service: Gastroenterology;  Laterality: N/A;  SPANISH INTERPRETER  ? ESOPHAGOGASTRODUODENOSCOPY N/A 03/22/2021  ? Procedure: ESOPHAGOGASTRODUODENOSCOPY (EGD);  Surgeon: Toledo, Benay Pike, MD;  Location: [facility] ENDOSCOPY;  Service: Gastroenterology;  Laterality: N/A;  ? ESOPHAGOGASTRODUODENOSCOPY (EGD) WITH PROPOFOL N/A 10/24/2017  ? Procedure: ESOPHAGOGASTRODUODENOSCOPY (EGD) WITH PROPOFOL;  Surgeon: [name], MD;  Location: [facility] ENDOSCOPY;  Service: Endoscopy;  Laterality: N/A;  ? ESOPHAGOGASTRODUODENOSCOPY (EGD)  WITH PROPOFOL N/A 07/12/2019  ? Procedure: ESOPHAGOGASTRODUODENOSCOPY (EGD) WITH PROPOFOL;  Surgeon: Jonathon Bellows, MD;  Location: [facility] ENDOSCOPY;  Service: Gastroenterology;  Laterality: N/A;  ? LEG SURGERY    ? ?Family History: History reviewed. No pertinent family history. ?Family Psychiatric  History:  ?Social History:  ?Social History  ? ?Substance and Sexual Activity  ?Alcohol Use Yes  ? Alcohol/week: 21.0 standard drinks  ? Types: 21 Shots of liquor per week  ? Comment: daily- 1 pint of vodka  ?   ?Social History  ? ?Substance and Sexual Activity  ?Drug Use No  ?  ?Social History  ? ?Socioeconomic History  ? Marital status: Married  ?  Spouse name: Not on file  ? Number of children: Not on file  ? Years of education: Not on file  ? Highest education level: Not on file  ?Occupational History  ? Not on file  ?Tobacco Use  ? Smoking status: Never  ? Smokeless tobacco: Never  ?Vaping Use  ? Vaping Use: Never used  ?Substance and Sexual Activity  ? Alcohol use: Yes  ?  Alcohol/week: 21.0 standard drinks  ?  Types: 21 Shots of liquor per week  ?  Comment: daily- 1 pint of vodka  ? Drug use: No  ? Sexual activity: Not on file  ?Other Topics Concern  ? Not on file  ?Social History Narrative  ? Not on file  ? ?Social Determinants of Health  ? ?Financial Resource Strain: Not on file  ?Food Insecurity: Not on file  ?Transportation Needs: Not on file  ?Physical Activity: Not on file  ?Stress: Not on file  ?Social Connections: Not on file  ? ?  Additional Social History:  ?  ?  ?  ?  ?  ?  ?  ?  ?  ?  ?  ? ?Sleep: Poor ? ?Appetite:  Fair ? ?Current Medications: ?Current Facility-Administered Medications  ?Medication Dose Route Frequency Provider Last Rate Last Admin  ? acetaminophen (TYLENOL) tablet 650 mg  650 mg Oral Q6H PRN Sherlon Handing, NP   650 mg at 10/20/21 1222  ? alum & mag hydroxide-simeth (MAALOX/MYLANTA) 200-200-20 MG/5ML suspension 30 mL  30 mL Oral Q4H PRN Waldon Merl F, NP      ? busPIRone (BUSPAR)  tablet 10 mg  10 mg Oral TID Clapacs, Madie Reno, MD   10 mg at 10/22/21 7106  ? FLUoxetine (PROZAC) capsule 30 mg  30 mg Oral Daily Bufford Helms   30 mg at 10/22/21 2694  ? gabapentin (NEURONTIN) capsule 800 mg  800 mg Oral TID Clapacs, Madie Reno, MD   800 mg at 10/22/21 8546  ? ibuprofen (ADVIL) tablet 600 mg  600 mg Oral Q6H PRN Clapacs, Madie Reno, MD   600 mg at 10/21/21 2120  ? levothyroxine (SYNTHROID) tablet 125 mcg  125 mcg Oral Q0600 Clapacs, Madie Reno, MD   125 mcg at 10/22/21 [phone]  ? lisinopril (ZESTRIL) tablet 5 mg  5 mg Oral Daily Clapacs, John T, MD   5 mg at 10/22/21 5009  ? magnesium hydroxide (MILK OF MAGNESIA) suspension 30 mL  30 mL Oral Daily PRN Sherlon Handing, NP      ? multivitamin with minerals tablet 1 tablet  1 tablet Oral Daily Sherlon Handing, NP   1 tablet at 10/22/21 3818  ? pantoprazole (PROTONIX) EC tablet 40 mg  40 mg Oral Daily Clapacs, Madie Reno, MD   40 mg at 10/22/21 2993  ? prazosin (MINIPRESS) capsule 2 mg  2 mg Oral QHS Tum[place]a Chimenti   2 mg at 10/21/21 2120  ? QUEtiapine (SEROQUEL) tablet 50 mg  50 mg Oral QHS Srishti Strnad   50 mg at 10/21/21 2120  ? thiamine tablet 100 mg  100 mg Oral Daily Waldon Merl F, NP   100 mg at 10/22/21 7169  ? ? ?Lab Results: No results found for this or any previous visit (from the past 48 hour(s)). ? ?Blood Alcohol level:  ?Lab Results  ?Component Value Date  ? ETH 423 (HH) 10/17/2021  ? ETH 252 (H) 05/24/2021  ? ? ?Metabolic Disorder Labs: ?Lab Results  ?Component Value Date  ? HGBA1C 5.3 11/08/2020  ? MPG 105.41 11/08/2020  ? MPG 105 05/12/2020  ? ?No results found for: PROLACTIN ?Lab Results  ?Component Value Date  ? CHOL 167 11/08/2020  ? TRIG 89 11/08/2020  ? HDL 90 11/08/2020  ? CHOLHDL 1.9 11/08/2020  ? VLDL 18 11/08/2020  ? [place] 59 11/08/2020  ? LDLCALC 103 (H) 05/12/2020  ? ? ?Physical Findings: ?AIMS:  , ,  ,  ,    ?CIWA:  CIWA-Ar Total: 0 ?COWS:    ? ?Musculoskeletal: ?Strength & Muscle Tone: within normal limits ?Gait & Station:  normal ?Patient leans: N/A ? ?Psychiatric Specialty Exam: ? ?Presentation  ?General Appearance: Appropriate for Environment; Disheveled; Neat ? ?Eye Contact:Fair ? ?Speech:Normal Rate ? ?Speech Volume:Decreased ? ?Handedness:Right ? ? ?Mood and Affect  ?Mood:Anxious; Depressed ? ?Affect:Congruent ? ? ?Thought Process  ?Thought Processes:Coherent ? ?Descriptions of Associations:Intact ? ?Orientation:Full (Time, Place and Person) ? ?Thought Content:Logical ? ?History of Schizophrenia/Schizoaffective disorder:No ? ?Duration of Psychotic Symptoms:Greater than six  months ? ?Hallucinations:Hallucinations: Auditory; Visual ?Description of Auditory Hallucinations: voices of his [place] peers ? ? ?Ideas of Reference:None ? ?Suicidal Thoughts:Suicidal Thoughts: Yes, Passive ?SI Passive Intent and/or Plan: Without Plan ? ?Homicidal Thoughts:Homicidal Thoughts: No ? ? ?Sensorium  ?Memory:Immediate Fair; Immediate Good; Remote Good ? ?Judgment:Fair ? ?Insight:Fair ? ? ?Executive Functions  ?Concentration:Fair ? ?Attention Span:Fair ? ?Recall:Fair ? ?[place] ? ?Language:Fair ? ? ?Psychomotor Activity  ?Psychomotor Activity:Psychomotor Activity: Psychomotor Retardation ? ? ?Assets  ?Assets:Communication Skills; Desire for Improvement; Housing; Physical Health ? ? ?Sleep  ?Sleep:No data recorded ? ? ?Physical Exam: ?Physical Exam ?Constitutional:   ?   Appearance: Normal appearance. He is normal weight.  ?HENT:  ?   Head: Normocephalic and atraumatic.  ?   Right Ear: Tympanic membrane and ear canal normal.  ?   Left Ear: Tympanic membrane and ear canal normal.  ?   Nose: Nose normal.  ?   Mouth/Throat:  ?   Mouth: Mucous membranes are moist.  ?   Pharynx: Oropharynx is clear.  ?Eyes:  ?   Extraocular Movements: Extraocular movements intact.  ?   Conjunctiva/sclera: Conjunctivae normal.  ?   Pupils: Pupils are equal, round, and reactive to light.  ?Cardiovascular:  ?   Rate and Rhythm: Normal rate and regular rhythm.   ?   Pulses: Normal pulses.  ?   Heart sounds: Normal heart sounds.  ?Pulmonary:  ?   Effort: Pulmonary effort is normal.  ?   Breath sounds: Normal breath sounds.  ?Abdominal:  ?   General: Abdomen is flat.

## 2021-10-23 DIAGNOSIS — F4312 Post-traumatic stress disorder, chronic: Secondary | ICD-10-CM | POA: Diagnosis not present

## 2021-10-23 NOTE — Plan of Care (Signed)
D: Pt alert and oriented. Pt rates depression 6/10, hopelessness 6/10, and anxiety 7/10. Pt reports energy level as normal. Pt reports sleep last night as being fair. Pt did receive medications for sleep and did find them helpful. Pt reports experiencing back pain at this time, prn meds given. Pt denies experiencing any SI/HI, or AVH at this time.  ? ?A: Scheduled medications administered to pt, per MD orders. Support and encouragement provided. Frequent verbal contact made. Routine safety checks conducted q15 minutes.  ? ?R: No adverse drug reactions noted. Pt verbally contracts for safety at this time. Pt compliant with medications and treatment plan. Pt interacts well with others on the unit. Pt remains safe at this time. Will continue to monitor.  ? ?Problem: Education: ?Goal: Knowledge of [place] [facility] Education information/materials will improve ?Outcome: Progressing ?Goal: Emotional status will improve ?Outcome: Progressing ?  ?

## 2021-10-23 NOTE — Progress Notes (Signed)
[facility] MD Progress Note ? ?10/23/2021 4:59 PM ?Ivan Hamilton  ?MRN:  [id number] ?Subjective: Follow-up 56 year old man with PTSD and alcohol abuse.  Patient told me this afternoon he was glad we had not discharged him as he did not feel safe.  He is still anxious and dysphoric but denies acute suicidal ideation.  Very worried he is about relapsing into alcohol use.  Currently not confused no delirium no tremors active.  He denies any psychotic symptoms.  Attending groups well.  Tolerating medicine well.  Sleeping better. 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  Hypothyroid ?  Alcohol withdrawal ([place]) ?  Alcohol use disorder, severe, dependence ([place]) ? ?Total Time spent with patient: 30 minutes ? ?Past Psychiatric History: Past history of longstanding alcohol abuse and PTSD with frequent suicidal ideation and a great deal of difficulty staying sober ? ?Past Medical History:  ?Past Medical History:  ?Diagnosis Date  ? Alcohol withdrawal ([place])   ? B12 deficiency   ? Chronic pain   ? CVA (cerebral vascular accident) [facility])   ? Hypertension   ? Hypothyroidism   ? Varicose veins   ?  ?Past Surgical History:  ?Procedure Laterality Date  ? CHOLECYSTECTOMY    ? COLONOSCOPY WITH PROPOFOL N/A 03/22/2021  ? Procedure: COLONOSCOPY WITH PROPOFOL;  Surgeon: Toledo, Benay Pike, MD;  Location: [facility] ENDOSCOPY;  Service: Gastroenterology;  Laterality: N/A;  SPANISH INTERPRETER  ? ESOPHAGOGASTRODUODENOSCOPY N/A 03/22/2021  ? Procedure: ESOPHAGOGASTRODUODENOSCOPY (EGD);  Surgeon: Toledo, Benay Pike, MD;  Location: [facility] ENDOSCOPY;  Service: Gastroenterology;  Laterality: N/A;  ? ESOPHAGOGASTRODUODENOSCOPY (EGD) WITH PROPOFOL N/A 10/24/2017  ? Procedure: ESOPHAGOGASTRODUODENOSCOPY (EGD) WITH PROPOFOL;  Surgeon: [name], MD;  Location: [facility] ENDOSCOPY;  Service: Endoscopy;   Laterality: N/A;  ? ESOPHAGOGASTRODUODENOSCOPY (EGD) WITH PROPOFOL N/A 07/12/2019  ? Procedure: ESOPHAGOGASTRODUODENOSCOPY (EGD) WITH PROPOFOL;  Surgeon: Jonathon Bellows, MD;  Location: [facility] ENDOSCOPY;  Service: Gastroenterology;  Laterality: N/A;  ? LEG SURGERY    ? ?Family History: History reviewed. No pertinent family history. ?Family Psychiatric  History: See previous ?Social History:  ?Social History  ? ?Substance and Sexual Activity  ?Alcohol Use Yes  ? Alcohol/week: 21.0 standard drinks  ? Types: 21 Shots of liquor per week  ? Comment: daily- 1 pint of vodka  ?   ?Social History  ? ?Substance and Sexual Activity  ?Drug Use No  ?  ?Social History  ? ?Socioeconomic History  ? Marital status: Married  ?  Spouse name: Not on file  ? Number of children: Not on file  ? Years of education: Not on file  ? Highest education level: Not on file  ?Occupational History  ? Not on file  ?Tobacco Use  ? Smoking status: Never  ? Smokeless tobacco: Never  ?Vaping Use  ? Vaping Use: Never used  ?Substance and Sexual Activity  ? Alcohol use: Yes  ?  Alcohol/week: 21.0 standard drinks  ?  Types: 21 Shots of liquor per week  ?  Comment: daily- 1 pint of vodka  ? Drug use: No  ? Sexual activity: Not on file  ?Other Topics Concern  ? Not on file  ?Social History Narrative  ? Not on file  ? ?Social Determinants of Health  ? ?Financial Resource Strain: Not on file  ?Food Insecurity: Not on file  ?Transportation Needs: Not on file  ?Physical Activity: Not on file  ?Stress: Not on  file  ?Social Connections: Not on file  ? ?Additional Social History:  ?  ?  ?  ?  ?  ?  ?  ?  ?  ?  ?  ? ?Sleep: Fair ? ?Appetite:  Fair ? ?Current Medications: ?Current Facility-Administered Medications  ?Medication Dose Route Frequency Provider Last Rate Last Admin  ? acetaminophen (TYLENOL) tablet 650 mg  650 mg Oral Q6H PRN Sherlon Handing, NP   650 mg at 10/20/21 1222  ? alum & mag hydroxide-simeth (MAALOX/MYLANTA) 200-200-20 MG/5ML suspension 30 mL  30  mL Oral Q4H PRN Waldon Merl F, NP      ? busPIRone (BUSPAR) tablet 10 mg  10 mg Oral TID Marque Rademaker, Madie Reno, MD   10 mg at 10/23/21 1618  ? FLUoxetine (PROZAC) capsule 30 mg  30 mg Oral Daily Malik, Kashif   30 mg at 10/23/21 0751  ? gabapentin (NEURONTIN) capsule 800 mg  800 mg Oral TID Kayliee Atienza T, MD   800 mg at 10/23/21 1618  ? ibuprofen (ADVIL) tablet 600 mg  600 mg Oral Q6H PRN Oriya Kettering, Madie Reno, MD   600 mg at 10/23/21 1618  ? levothyroxine (SYNTHROID) tablet 125 mcg  125 mcg Oral Q0600 Tongela Encinas, Madie Reno, MD   125 mcg at 10/23/21 0538  ? lisinopril (ZESTRIL) tablet 5 mg  5 mg Oral Daily Dakin Madani, Madie Reno, MD   5 mg at 10/23/21 0753  ? magnesium hydroxide (MILK OF MAGNESIA) suspension 30 mL  30 mL Oral Daily PRN Waldon Merl F, NP      ? multivitamin with minerals tablet 1 tablet  1 tablet Oral Daily Sherlon Handing, NP   1 tablet at 10/23/21 0753  ? pantoprazole (PROTONIX) EC tablet 40 mg  40 mg Oral Daily Jaylynne Birkhead, Madie Reno, MD   40 mg at 10/23/21 0753  ? prazosin (MINIPRESS) capsule 2 mg  2 mg Oral QHS Malik, Kashif   2 mg at 10/22/21 2117  ? QUEtiapine (SEROQUEL) tablet 50 mg  50 mg Oral QHS Malik, Kashif   50 mg at 10/22/21 2118  ? thiamine tablet 100 mg  100 mg Oral Daily Waldon Merl F, NP   100 mg at 10/23/21 0350  ? ? ?Lab Results: No results found for this or any previous visit (from the past 48 hour(s)). ? ?Blood Alcohol level:  ?Lab Results  ?Component Value Date  ? ETH 423 (HH) 10/17/2021  ? ETH 252 (H) 05/24/2021  ? ? ?Metabolic Disorder Labs: ?Lab Results  ?Component Value Date  ? HGBA1C 5.3 11/08/2020  ? MPG 105.41 11/08/2020  ? MPG 105 05/12/2020  ? ?No results found for: PROLACTIN ?Lab Results  ?Component Value Date  ? CHOL 167 11/08/2020  ? TRIG 89 11/08/2020  ? HDL 90 11/08/2020  ? CHOLHDL 1.9 11/08/2020  ? VLDL 18 11/08/2020  ? [place] 59 11/08/2020  ? LDLCALC 103 (H) 05/12/2020  ? ? ?Physical Findings: ?AIMS:  , ,  ,  ,    ?CIWA:  CIWA-Ar Total: 3 ?COWS:     ? ?Musculoskeletal: ?Strength & Muscle Tone: within normal limits ?Gait & Station: normal ?Patient leans: N/A ? ?Psychiatric Specialty Exam: ? ?Presentation  ?General Appearance: Appropriate for Environment; Disheveled; Neat ? ?Eye Contact:Fair ? ?Speech:Normal Rate ? ?Speech Volume:Decreased ? ?Handedness:Right ? ? ?Mood and Affect  ?Mood:Anxious; Depressed ? ?Affect:Congruent ? ? ?Thought Process  ?Thought Processes:Coherent ? ?Descriptions of Associations:Intact ? ?Orientation:Full (Time, Place and Person) ? ?Thought Content:Logical ? ?History of  Schizophrenia/Schizoaffective disorder:No ? ?Duration of Psychotic Symptoms:Greater than six months ? ?Hallucinations:No data recorded ?Ideas of Reference:None ? ?Suicidal Thoughts:No data recorded ?Homicidal Thoughts:No data recorded ? ?Sensorium  ?Memory:Immediate Fair; Immediate Good; Remote Good ? ?Judgment:Fair ? ?Insight:Fair ? ? ?Executive Functions  ?Concentration:Fair ? ?Attention Span:Fair ? ?Recall:Fair ? ?[place] ? ?Language:Fair ? ? ?Psychomotor Activity  ?Psychomotor Activity:No data recorded ? ?Assets  ?Assets:Communication Skills; Desire for Improvement; Housing; Physical Health ? ? ?Sleep  ?Sleep:No data recorded ? ? ?Physical Exam: ?Physical Exam ?Vitals and nursing note reviewed.  ?Constitutional:   ?   Appearance: Normal appearance.  ?HENT:  ?   Head: Normocephalic and atraumatic.  ?   Mouth/Throat:  ?   Pharynx: Oropharynx is clear.  ?Eyes:  ?   Pupils: Pupils are equal, round, and reactive to light.  ?Cardiovascular:  ?   Rate and Rhythm: Normal rate and regular rhythm.  ?Pulmonary:  ?   Effort: Pulmonary effort is normal.  ?   Breath sounds: Normal breath sounds.  ?Abdominal:  ?   General: Abdomen is flat.  ?   Palpations: Abdomen is soft.  ?Musculoskeletal:     ?   General: Normal range of motion.  ?Skin: ?   General: Skin is warm and dry.  ?Neurological:  ?   General: No focal deficit present.  ?   Mental Status: He is alert.  Mental status is at baseline.  ?Psychiatric:     ?   Mood and Affect: Mood normal.     ?   Thought Content: Thought content normal.  ? ?Review of Systems  ?Constitutional: Negative.   ?HENT: Negative.    ?Eyes: Negative.   ?[place]

## 2021-10-23 NOTE — Group Note (Signed)
[place] LCSW Group Therapy Note ? ? ? ?Group Date: 10/23/2021 ?Start Time: 1330 ?End Time: 1430 ? ?Type of Therapy and Topic:  Group Therapy:  Overcoming Obstacles ? ?Participation Level:  [facility] PARTICIPATION LEVEL: Active ? ?Mood: ? ?Description of Group:   ?In this group patients will be encouraged to explore what they see as obstacles to their own wellness and recovery. They will be guided to discuss their thoughts, feelings, and behaviors related to these obstacles. The group will process together ways to cope with barriers, with attention given to specific choices patients can make. Each patient will be challenged to identify changes they are motivated to make in order to overcome their obstacles. This group will be process-oriented, with patients participating in exploration of their own experiences as well as giving and receiving support and challenge from other group members. ? ?Therapeutic Goals: ?1. Patient will identify personal and current obstacles as they relate to admission. ?2. Patient will identify barriers that currently interfere with their wellness or overcoming obstacles.  ?3. Patient will identify feelings, thought process and behaviors related to these barriers. ?4. Patient will identify two changes they are willing to make to overcome these obstacles:  ? ? ?Summary of Patient Progress ?Patient was present in group. Patient was active in group.  Patient shared that communication has been a barrier. He also identifies a barrier as being being scared and lack of support.  He was able to engage in discussion about his coping skills, stating that he takes a walk.   ? ?Therapeutic Modalities:   ?Cognitive Behavioral Therapy ?Solution Focused Therapy ?Motivational Interviewing ?Relapse Prevention Therapy ? ? ?Rozann Lesches, LCSW ?

## 2021-10-23 NOTE — Progress Notes (Addendum)
Recreation Therapy Notes ? ?Date: 10/23/2021 ? ?Time: 10:35 am   ? ?Location: [address] [place]  ? ?Behavioral response: Appropriate ? ?Intervention Topic:  Leisure   ? ?Discussion/Intervention:  ?Group content today was focused on leisure. The group defined what leisure is and some positive leisure activities they participate in. Individuals identified the difference between good and bad leisure. Participants expressed how they feel after participating in the leisure of their choice. The group discussed how they go about picking a leisure activity and if others are involved in their leisure activities. The patient stated how many leisure activities they have to choose from and reasons why it is important to have leisure time. Individuals participated in the intervention ?Exploration of Leisure? where they had a chance to identify new leisure activities as well as benefits of leisure. ?Clinical Observations/Feedback: ?Patient came to group late  and was focused on what peers and staff had to say about leisure. Participant participated in open leisure while being appropriate with staff and peers. Individual was social with peers and staff while participating in the intervention.    ?Kalandra Masters LRT/CTRS  ? ? ? ? ? ? ? ? ? ?Ivan Hamilton ?10/23/2021 12:25 PM ?

## 2021-10-23 NOTE — Progress Notes (Signed)
Patient alert and oriented x 4, affect is flat but brightens upon approach he is on CIWA no S/S of withdrawal he is interacting appropriately VS WNL. He denies SI/HI/AVH 15 minutes safety checks maintained will continue to monitor closely.  ?

## 2021-10-23 NOTE — Plan of Care (Signed)
?  Problem: Education: ?Goal: Knowledge of [facility] Education information/materials will improve ?Outcome: Progressing ?Goal: Emotional status will improve ?Outcome: Progressing ?Goal: Mental status will improve ?Outcome: Progressing ?Goal: Verbalization of understanding the information provided will improve ?Outcome: Progressing ?  ?Problem: Safety: ?Goal: Periods of time without injury will increase ?Outcome: Progressing ?  ?Problem: Education: ?Goal: Utilization of techniques to improve thought processes will improve ?Outcome: Progressing ?Goal: Knowledge of the prescribed therapeutic regimen will improve ?Outcome: Progressing ?  ?Problem: Safety: ?Goal: Ability to disclose and discuss suicidal ideas will improve ?Outcome: Progressing ?Goal: Ability to identify and utilize support systems that promote safety will improve ?Outcome: Progressing ?  ?

## 2021-10-23 NOTE — Progress Notes (Signed)
Patient awake and alert. Present in day room this shift. Denies SI/HI. Endorses Endoreses voices saying ...telling me my name. Denies visual hallucinations. Back pain 6/10 refuse medications. States, "I will be sleep by then." Rates Anxiety 6/10 and depression 4/10.  ? ?Takes medications as prescribed. No adverse reactions noted. Compliant with treatment plan. Cont Q15 minute check for safety.  ?

## 2021-10-24 DIAGNOSIS — F4312 Post-traumatic stress disorder, chronic: Secondary | ICD-10-CM | POA: Diagnosis not present

## 2021-10-24 MED ORDER — PANTOPRAZOLE SODIUM 40 MG PO TBEC: 40.0000 mg | DELAYED_RELEASE_TABLET | Freq: Every day | ORAL | 1 refills | Status: AC

## 2021-10-24 MED ORDER — LEVOTHYROXINE SODIUM 125 MCG PO TABS: 125.0000 ug | ORAL_TABLET | Freq: Every day | ORAL | 1 refills | Status: AC

## 2021-10-24 MED ORDER — PRAZOSIN HCL 2 MG PO CAPS: 2.0000 mg | ORAL_CAPSULE | Freq: Every day | ORAL | 1 refills | Status: AC

## 2021-10-24 MED ORDER — QUETIAPINE FUMARATE 50 MG PO TABS
50.0000 mg | ORAL_TABLET | Freq: Every day | ORAL | 1 refills | Status: DC
Start: 2021-10-24 — End: 2022-02-27

## 2021-10-24 MED ORDER — FLUOXETINE HCL 40 MG PO CAPS: 40.0000 mg | ORAL_CAPSULE | Freq: Every day | ORAL | 1 refills | Status: AC

## 2021-10-24 MED ORDER — BUSPIRONE HCL 10 MG PO TABS: 10.0000 mg | ORAL_TABLET | Freq: Three times a day (TID) | ORAL | 1 refills | Status: DC

## 2021-10-24 MED ORDER — GABAPENTIN 400 MG PO CAPS: 800.0000 mg | ORAL_CAPSULE | Freq: Three times a day (TID) | ORAL | 1 refills | Status: AC

## 2021-10-24 MED ORDER — LISINOPRIL 5 MG PO TABS: 5.0000 mg | ORAL_TABLET | Freq: Every day | ORAL | 1 refills | Status: AC

## 2021-10-24 NOTE — Care Management Important Message (Signed)
Important Message ? ?Patient Details  ?Name: Ivan Hamilton ?MRN: [id number] ?Date of Birth: 09/20/1965 ? ? ?Medicare Important Message Given:  Yes ? ?Patient informed of right to appeal discharge, provided phone number to [facility]. Patient expressed no interest in appealing discharge at this time. CSW will continue to monitor situation. ? ? ?Durenda Hurt, LCSWA ?10/24/2021, 12:42 PM ? ?

## 2021-10-24 NOTE — Progress Notes (Signed)
?  [facility] Adult Case Management Discharge Plan : ? ?Will you be returning to the same living situation after discharge:  Yes,  Patient to return to his place of residence at discharge ([place], [place]).  ? ?At discharge, do you have transportation home?: Yes,  CSW to assist with transportation. Patient to be transported by cab.  ? ?Do you have the ability to pay for your medications: Yes,  [place] Medicare  Patient informed of right to appeal discharge, provided phone number to [facility]. Patient expressed no interest in appealing discharge at this time. CSW will continue to monitor situation. ? ?Release of information consent forms completed and in the chart;  Patient's signature needed at discharge. ? ?Patient to Follow up at: ? Follow-up Information   ? ? Services, [facility]. Call.   ?Why: If symptoms worsen, please call phone number provided in order to be seen for assessment. If you are in immediate crisis please call 911. ?Contact information: ?[address] ?[place] [address] ?[phone] ? ? ?  ?  ? ? Alcoholics Anonymous Follow up.   ?Why: Please go to web adress provided to find the scheduled meetings in your area. ?Contact information: ?[email or web address] ? ?  ?  ? ?  ?  ? ?  ?  ? ? ?Next level of care provider has access to [place] ? ?Safety Planning and Suicide Prevention discussed: Yes,  SPE completed with patient and Griffon Herberg, ex-spouse.  ?  ?Has patient been referred to the Quitline?: N/A patient is not a smoker Patient denied active tobacco use, per nursing admission patient is at low risk of tobacco related ailments (see SDH section).  ? ?Patient has been referred for addiction treatment: Yes Patient endorses significant alcohol use; diagnosed with Alcohol Use Disorder, Severe. Patient has declined outpatient referral aside from Alcoholics Anonymous resources due to his plans on returning to [place] shortly after discharge. Outpatient provider  contact information has been added in the case patient decides to remain in the country.  ? ?Durenda Hurt, LCSWA ?10/24/2021, 9:56 AM ?

## 2021-10-24 NOTE — [facility] Suicide Risk Assessment (Signed)
[facility] Discharge Suicide Risk Assessment ? ? 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  Hypothyroid ?  Alcohol withdrawal ([place]) ?  Alcohol use disorder, severe, dependence ([place]) ? ? ?Total Time spent with patient: 30 minutes ? ?Musculoskeletal: ?Strength & Muscle Tone: within normal limits ?Gait & Station: normal ?Patient leans: N/A ? ?Psychiatric Specialty Exam ? ?Presentation  ?General Appearance: Appropriate for Environment; Disheveled; Neat ? ?Eye Contact:Fair ? ?Speech:Normal Rate ? ?Speech Volume:Decreased ? ?Handedness:Right ? ? ?Mood and Affect  ?Mood:Anxious; Depressed ? ?Duration of Depression Symptoms: Greater than two weeks ? ?Affect:Congruent ? ? ?Thought Process  ?Thought Processes:Coherent ? ?Descriptions of Associations:Intact ? ?Orientation:Full (Time, Place and Person) ? ?Thought Content:Logical ? ?History of Schizophrenia/Schizoaffective disorder:No ? ?Duration of Psychotic Symptoms:Greater than six months ? ?Hallucinations:No data recorded ?Ideas of Reference:None ? ?Suicidal Thoughts:No data recorded ?Homicidal Thoughts:No data recorded ? ?Sensorium  ?Memory:Immediate Fair; Immediate Good; Remote Good ? ?Judgment:Fair ? ?Insight:Fair ? ? ?Executive Functions  ?Concentration:Fair ? ?Attention Span:Fair ? ?Recall:Fair ? ?[place] ? ?Language:Fair ? ? ?Psychomotor Activity  ?Psychomotor Activity:No data recorded ? ?Assets  ?Assets:Communication Skills; Desire for Improvement; Housing; Physical Health ? ? ?Sleep  ?Sleep:No data recorded ? ?Physical Exam: ?Physical Exam ?Vitals and nursing note reviewed.  ?Constitutional:   ?   Appearance: Normal appearance.  ?HENT:  ?   Head: Normocephalic and atraumatic.  ?   Mouth/Throat:  ?   Pharynx: Oropharynx is clear.  ?Eyes:  ?   Pupils: Pupils are equal, round, and  reactive to light.  ?Cardiovascular:  ?   Rate and Rhythm: Normal rate and regular rhythm.  ?Pulmonary:  ?   Effort: Pulmonary effort is normal.  ?   Breath sounds: Normal breath sounds.  ?Abdominal:  ?   General: Abdomen is flat.  ?   Palpations: Abdomen is soft.  ?Musculoskeletal:     ?   General: Normal range of motion.  ?Skin: ?   General: Skin is warm and dry.  ?Neurological:  ?   General: No focal deficit present.  ?   Mental Status: He is alert. Mental status is at baseline.  ?Psychiatric:     ?   Mood and Affect: Mood normal.     ?   Thought Content: Thought content normal.  ? ?Review of Systems  ?Constitutional: Negative.   ?HENT: Negative.    ?Eyes: Negative.   ?Respiratory: Negative.    ?Cardiovascular: Negative.   ?Gastrointestinal: Negative.   ?Musculoskeletal: Negative.   ?Skin: Negative.   ?Neurological: Negative.   ?Psychiatric/Behavioral: Negative.    ?Blood pressure 131/77, pulse (!) 57, temperature 98.6 ?F (37 ?C), temperature source Oral, resp. rate 18, height '5\' 5"'$  (1.651 m), weight 75.8 kg, SpO2 98 %. Body mass index is 27.79 kg/m?. ? ?Mental Status Per Nursing Assessment::   ?On Admission:  NA ? ?Demographic Factors:  ?Male, Divorced or widowed, and Living alone ? ?Loss Factors: ?Loss of significant relationship and Decline in physical health ? ?Historical Factors: ?Impulsivity ? ?Risk Reduction Factors:   ?Religious beliefs about death and Positive social support ? ?Continued Clinical Symptoms:  ?Depression:   Comorbid alcohol abuse/dependence ?Alcohol/Substance Abuse/Dependencies ? ?Cognitive Features That Contribute To Risk:  ?None   ? ?Suicide Risk:  ?Minimal: No identifiable suicidal ideation.  Patients presenting with no risk factors but with morbid ruminations; may be classified as minimal risk based on the  severity of the depressive symptoms ? ? Follow-up Information   ? ? Services, [facility]. Call.   ?Why: If symptoms worsen, please call phone number provided in order to be  seen for assessment. If you are in immediate crisis please call 911. ?Contact information: ?[address] ?[place] [address] ?[phone] ? ? ?  ?  ? ? Alcoholics Anonymous Follow up.   ?Why: Please go to web adress provided to find the scheduled meetings in your area. ?Contact information: ?[email or web address] ? ?  ?  ? ?  ?  ? ?  ? ? ?Plan Of Care/Follow-up recommendations:  ?Continue current medication.  Refer to local substance abuse and mental health treatment.  Encouraged patient to stay involved with AA meetings and to consider following up with day program. ? ?Alethia Berthold, MD ?10/24/2021, 12:57 PM ?
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
## 2021-10-24 NOTE — B[facility] Counselor (Signed)
CSW provided the patient with name and number of [name] his case Fre[employer] forwarder. ? ?Ivan Hamilton., [id number] ? ?Assunta Curtis, MSW, LCSW ?10/24/2021 3:12 PM   ?

## 2021-10-24 NOTE — Plan of Care (Signed)
D: Pt alert and oriented. Pt rates depression 9/10, hopelessness 8/10, and anxiety 9/10.  Pt reports energy level as low and concentration as being poor. Pt reports sleep last night as being poor. Pt did not receive medications for sleep. Pt reports experiencing chronic back pain at this time, prn pain management medication offered and refused. Pt denies experiencing any SI/HI, or AVH at this time.  ? ?A: Scheduled medications administered to pt, per MD orders. Support and encouragement provided. Frequent verbal contact made. Routine safety checks conducted q15 minutes.  ? ?R: No adverse drug reactions noted. Pt verbally contracts for safety at this time. Pt compliant with medications and treatment plan. Pt interacts well with others on the unit. Pt remains safe at this time. Will continue to monitor.  ? ?Problem: Education: ?Goal: Knowledge of [place] [facility] Education information/materials will improve ?Outcome: Progressing ?Goal: Verbalization of understanding the information provided will improve ?Outcome: Progressing ?  ?

## 2021-10-24 NOTE — Progress Notes (Signed)
D: Pt alert and oriented. Pt denies experiencing any pain, SI/HI, or AVH at this time. Pt reports he will be able to keep himself safe when he returns home.  ? ?A: Pt received discharge and medication education/information. Pt belongings were returned and signed for at this time to included printed prescriptions.  ? ?R: Pt verbalized understanding of discharge and medication education/information. ? ?Pt escorted by staff to medical mall front lobby where [facility] picked pt up and transported him home.  ?

## 2021-10-24 NOTE — Group Note (Deleted)
[place] LCSW Group Therapy Note ? ? ?Group Date: 10/24/2021 ?Start Time: 1300 ?End Time: 1400 ? ?Type of Therapy/Topic:  Group Therapy:  Feelings about Diagnosis ? ?Participation Level:  {[facility] PARTICIPATION LEVEL:22264}  ? ?Mood: *** ? ? ?Description of Group:   ? This group will allow patients to explore their thoughts and feelings about diagnoses they have received. Patients will be guided to explore their level of understanding and acceptance of these diagnoses. Facilitator will encourage patients to process their thoughts and feelings about the reactions of others to their diagnosis, and will guide patients in identifying ways to discuss their diagnosis with significant others in their lives. This group will be process-oriented, with patients participating in exploration of their own experiences as well as giving and receiving support and challenge from other group members. ? ? ?Therapeutic Goals: ?1. Patient will demonstrate understanding of diagnosis as evidence by identifying two or more symptoms of the disorder:  ?2. Patient will be able to express two feelings regarding the diagnosis ?3. Patient will demonstrate ability to communicate their needs through discussion and/or role plays ? ?Summary of Patient Progress: ? ? ? ?*** ? ? ? ?Therapeutic Modalities:   ?Cognitive Behavioral Therapy ?Brief Therapy ?Feelings Identification  ? ? ?Rozann Lesches, LCSW ?

## 2021-10-24 NOTE — Plan of Care (Signed)
?  Problem: Group Participation ?Goal: STG - Patient will engage in groups without prompting or encouragement from LRT x3 group sessions within 5 recreation therapy group sessions ?Description: STG - Patient will engage in groups without prompting or encouragement from LRT x3 group sessions within 5 recreation therapy group sessions ?10/24/2021 1205 by Ernest Haber, LRT ?Outcome: Adequate for Discharge ?10/24/2021 1204 by Ernest Haber, LRT ?Outcome: Adequate for Discharge ?  ?

## 2021-10-24 NOTE — Progress Notes (Signed)
Recreation Therapy Notes ? ?INPATIENT RECREATION TR PLAN ? ?Patient Details ?Name: Ivan Hamilton ?MRN: [id number] ?DOB: Jul 26, 1965 ?Today's Date: 10/24/2021 ? ?Rec Therapy Plan ?Is patient appropriate for Therapeutic Recreation?: Yes ?Treatment times per week: at least 3 ?Estimated Length of Stay: 5-7 days ?TR Treatment/Interventions: Group participation (Comment) ? ?Discharge Criteria ?Pt will be discharged from therapy if:: Discharged ?Treatment plan/goals/alternatives discussed and agreed upon by:: Patient/family ? ?Discharge Summary ?Short term goals set: Patient will engage in groups without prompting or encouragement from LRT x3 group sessions within 5 recreation therapy group sessions ?Short term goals met: Adequate for discharge ?Progress toward goals comments: Groups attended ?Which groups?: Leisure education ?Reason goals not met: N/A ?Therapeutic equipment acquired: N/A ?Reason patient discharged from therapy: Discharge from hospital ?Pt/family agrees with progress & goals achieved: Yes ?Date patient discharged from therapy: 10/24/21 ? ? ?Antoin Dargis ?10/24/2021, 12:06 PM ?

## 2021-10-24 NOTE — Group Note (Signed)
[place] LCSW Group Therapy Note ? ? ?Group Date: 10/24/2021 ?Start Time: 1300 ?End Time: 1400 ? ?Type of Therapy/Topic:  Group Therapy:  Feelings about Diagnosis ? ?Participation Level:  Active  ? ?Mood: Euthymic   ? ? ?Description of Group:   ? This group will allow patients to explore their thoughts and feelings about diagnoses they have received. Patients will be guided to explore their level of understanding and acceptance of these diagnoses. Facilitator will encourage patients to process their thoughts and feelings about the reactions of others to their diagnosis, and will guide patients in identifying ways to discuss their diagnosis with significant others in their lives. This group will be process-oriented, with patients participating in exploration of their own experiences as well as giving and receiving support and challenge from other group members. ? ? ?Therapeutic Goals: ?1. Patient will demonstrate understanding of diagnosis as evidence by identifying two or more symptoms of the disorder:  ?2. Patient will be able to express two feelings regarding the diagnosis ?3. Patient will demonstrate ability to communicate their needs through discussion and/or role plays ? ?Summary of Patient Progress: ?Patient was present for the entirety of the group session. Patient was an active listener and participated in the topic of discussion, provided helpful advice to others, and added nuance to topic of conversation. Patient shared that he was distressed due to intense feelings pertaining to PTSD causing him to drink. Patient asked for a explanation of what PTSD, writer provided group with an overview explanation of PTSD and symptoms common to this disorder.  ? ? ? ?Therapeutic Modalities:   ?Cognitive Behavioral Therapy ?Brief Therapy ?Feelings Identification  ? ? ?Durenda Hurt, LCSWA ?

## 2021-10-29 ENCOUNTER — Emergency Department
Admission: EM | Admit: 2021-10-29 | Discharge: 2021-10-29 | Disposition: A | Discharge status: Home / Self Care | Payer: No Typology Code available for payment source | Attending: Emergency Medicine | Admitting: Emergency Medicine

## 2021-10-29 ENCOUNTER — Emergency Department: Payer: No Typology Code available for payment source

## 2021-10-29 DIAGNOSIS — Z5321 Procedure and treatment not carried out due to patient leaving prior to being seen by health care provider: Secondary | ICD-10-CM | POA: Insufficient documentation

## 2021-10-29 DIAGNOSIS — M7918 Myalgia, other site: Secondary | ICD-10-CM

## 2021-10-29 DIAGNOSIS — M549 Dorsalgia, unspecified: Secondary | ICD-10-CM | POA: Insufficient documentation

## 2021-10-29 DIAGNOSIS — F1020 Alcohol dependence, uncomplicated: Secondary | ICD-10-CM | POA: Diagnosis not present
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
## 2021-10-29 NOTE — ED Notes (Signed)
After triage completed by Lucina Mellow, pt became combative and started throwing his belonging and is refusing to leave the triage room and states "I need someone to remove me" asked if he would like security remove him from the triage room and pt states "yes". Security called. ?
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
## 2021-11-11 DIAGNOSIS — Z20822 Contact with and (suspected) exposure to covid-19: Secondary | ICD-10-CM | POA: Diagnosis not present

## 2021-11-11 DIAGNOSIS — R1084 Generalized abdominal pain: Secondary | ICD-10-CM | POA: Diagnosis not present

## 2021-11-11 DIAGNOSIS — R000 Tachycardia, unspecified: Secondary | ICD-10-CM | POA: Diagnosis not present

## 2021-11-11 DIAGNOSIS — F333 Major depressive disorder, recurrent, severe with psychotic symptoms: Secondary | ICD-10-CM | POA: Diagnosis not present

## 2021-11-11 DIAGNOSIS — R110 Nausea: Secondary | ICD-10-CM | POA: Diagnosis not present

## 2021-11-11 DIAGNOSIS — F1090 Alcohol use, unspecified, uncomplicated: Secondary | ICD-10-CM | POA: Diagnosis not present

## 2021-11-11 DIAGNOSIS — R45851 Suicidal ideations: Secondary | ICD-10-CM | POA: Diagnosis not present

## 2021-11-15 DIAGNOSIS — F209 Schizophrenia, unspecified: Secondary | ICD-10-CM | POA: Diagnosis not present

## 2021-11-16 DIAGNOSIS — F209 Schizophrenia, unspecified: Secondary | ICD-10-CM | POA: Diagnosis not present

## 2021-11-17 DIAGNOSIS — F209 Schizophrenia, unspecified: Secondary | ICD-10-CM | POA: Diagnosis not present

## 2021-11-18 DIAGNOSIS — F209 Schizophrenia, unspecified: Secondary | ICD-10-CM | POA: Diagnosis not present

## 2021-11-19 DIAGNOSIS — F209 Schizophrenia, unspecified: Secondary | ICD-10-CM | POA: Diagnosis not present

## 2021-11-20 DIAGNOSIS — F209 Schizophrenia, unspecified: Secondary | ICD-10-CM | POA: Diagnosis not present

## 2021-11-21 DIAGNOSIS — F209 Schizophrenia, unspecified: Secondary | ICD-10-CM | POA: Diagnosis not present

## 2022-01-25 DIAGNOSIS — L237 Allergic contact dermatitis due to plants, except food: Secondary | ICD-10-CM | POA: Diagnosis not present

## 2022-02-26 ENCOUNTER — Encounter: Payer: Self-pay | Admitting: Emergency Medicine

## 2022-02-26 ENCOUNTER — Emergency Department
Admission: EM | Admit: 2022-02-26 | Discharge: 2022-02-27 | Disposition: A | Discharge status: Home / Self Care | Payer: No Typology Code available for payment source | Attending: Emergency Medicine | Admitting: Emergency Medicine

## 2022-02-26 DIAGNOSIS — R45851 Suicidal ideations: Secondary | ICD-10-CM | POA: Insufficient documentation

## 2022-02-26 DIAGNOSIS — F4312 Post-traumatic stress disorder, chronic: Secondary | ICD-10-CM | POA: Diagnosis not present

## 2022-02-26 DIAGNOSIS — Z20822 Contact with and (suspected) exposure to covid-19: Secondary | ICD-10-CM | POA: Insufficient documentation

## 2022-02-26 DIAGNOSIS — F10229 Alcohol dependence with intoxication, unspecified: Secondary | ICD-10-CM | POA: Diagnosis not present

## 2022-02-26 DIAGNOSIS — F10920 Alcohol use, unspecified with intoxication, uncomplicated: Secondary | ICD-10-CM | POA: Diagnosis not present

## 2022-02-26 DIAGNOSIS — Z046 Encounter for general psychiatric examination, requested by authority: Secondary | ICD-10-CM | POA: Diagnosis present

## 2022-02-26 DIAGNOSIS — F10959 Alcohol use, unspecified with alcohol-induced psychotic disorder, unspecified: Secondary | ICD-10-CM | POA: Diagnosis not present

## 2022-02-26 DIAGNOSIS — F1024 Alcohol dependence with alcohol-induced mood disorder: Secondary | ICD-10-CM | POA: Diagnosis not present

## 2022-02-26 DIAGNOSIS — F10129 Alcohol abuse with intoxication, unspecified: Secondary | ICD-10-CM | POA: Diagnosis not present

## 2022-02-26 DIAGNOSIS — F10929 Alcohol use, unspecified with intoxication, unspecified: Secondary | ICD-10-CM | POA: Diagnosis present

## 2022-02-26 LAB — CBC
HCT: 48.4 % (ref 39.0–52.0)
Hemoglobin: 16.1 g/dL (ref 13.0–17.0)
MCH: 30.4 pg (ref 26.0–34.0)
MCHC: 33.3 g/dL (ref 30.0–36.0)
MCV: 91.5 fL (ref 80.0–100.0)
Platelets: 267 10*3/uL (ref 150–400)
RBC: 5.29 MIL/uL (ref 4.22–5.81)
RDW: 13.7 % (ref 11.5–15.5)
WBC: 8.4 10*3/uL (ref 4.0–10.5)
nRBC: 0 % (ref 0.0–0.2)

## 2022-02-26 LAB — COMPREHENSIVE METABOLIC PANEL
ALT: 59 U/L — ABNORMAL HIGH (ref 0–44)
AST: 79 U/L — ABNORMAL HIGH (ref 15–41)
Albumin: 4.8 g/dL (ref 3.5–5.0)
Alkaline Phosphatase: 82 U/L (ref 38–126)
Anion gap: 10 (ref 5–15)
BUN: 8 mg/dL (ref 6–20)
CO2: 20 mmol/L — ABNORMAL LOW (ref 22–32)
Calcium: 8.9 mg/dL (ref 8.9–10.3)
Chloride: 110 mmol/L (ref 98–111)
Creatinine, Ser: 0.9 mg/dL (ref 0.61–1.24)
GFR, Estimated: >60 mL/min (ref >60)
Glucose, Bld: 108 mg/dL — ABNORMAL HIGH (ref 70–99)
Potassium: 4 mmol/L (ref 3.5–5.1)
Sodium: 140 mmol/L (ref 135–145)
Total Bilirubin: 0.9 mg/dL (ref 0.3–1.2)
Total Protein: 9.5 g/dL — ABNORMAL HIGH (ref 6.5–8.1)

## 2022-02-26 LAB — SALICYLATE LEVEL: Salicylate Lvl: <7 mg/dL — ABNORMAL LOW (ref 7.0–30.0)

## 2022-02-26 LAB — ETHANOL: Alcohol, Ethyl (B): 390 mg/dL (ref <10)

## 2022-02-26 LAB — ACETAMINOPHEN LEVEL: Acetaminophen (Tylenol), Serum: <10 ug/mL — ABNORMAL LOW (ref 10–30)

## 2022-02-26 MED ORDER — DIPHENHYDRAMINE HCL 50 MG/ML IJ SOLN
INTRAMUSCULAR | Status: AC
  Administered 2022-02-26: 50 mg via INTRAMUSCULAR
  Filled 2022-02-26: qty 1

## 2022-02-26 MED ORDER — HALOPERIDOL LACTATE 5 MG/ML IJ SOLN
INTRAMUSCULAR | Status: AC
  Administered 2022-02-26: 5 mg via INTRAMUSCULAR
  Filled 2022-02-26: qty 1

## 2022-02-26 MED ORDER — PANTOPRAZOLE SODIUM 40 MG PO TBEC
40.0000 mg | DELAYED_RELEASE_TABLET | Freq: Every day | ORAL | Status: DC
  Administered 2022-02-27: 40 mg via ORAL
  Filled 2022-02-26: qty 1

## 2022-02-26 MED ORDER — GABAPENTIN 300 MG PO CAPS
800.0000 mg | ORAL_CAPSULE | Freq: Three times a day (TID) | ORAL | Status: DC
  Administered 2022-02-27: 800 mg via ORAL
  Filled 2022-02-26: qty 2

## 2022-02-26 MED ORDER — BUSPIRONE HCL 5 MG PO TABS
10.0000 mg | ORAL_TABLET | Freq: Three times a day (TID) | ORAL | Status: DC
  Administered 2022-02-27: 10 mg via ORAL
  Filled 2022-02-26: qty 2

## 2022-02-26 MED ORDER — LORAZEPAM 2 MG/ML IJ SOLN: 2.0000 mg | Freq: Once | INTRAMUSCULAR | Status: AC

## 2022-02-26 MED ORDER — LORAZEPAM 2 MG/ML IJ SOLN
INTRAMUSCULAR | Status: AC
  Administered 2022-02-26: 2 mg via INTRAMUSCULAR
  Filled 2022-02-26: qty 1

## 2022-02-26 MED ORDER — QUETIAPINE FUMARATE 25 MG PO TABS: 50.0000 mg | ORAL_TABLET | Freq: Every day | ORAL | Status: DC

## 2022-02-26 MED ORDER — LEVOTHYROXINE SODIUM 50 MCG PO TABS: 125.0000 ug | ORAL_TABLET | Freq: Every day | ORAL | Status: DC

## 2022-02-26 MED ORDER — LISINOPRIL 5 MG PO TABS
5.0000 mg | ORAL_TABLET | Freq: Every day | ORAL | Status: DC
  Administered 2022-02-27: 5 mg via ORAL
  Filled 2022-02-26: qty 1

## 2022-02-26 MED ORDER — DIPHENHYDRAMINE HCL 50 MG/ML IJ SOLN: 50.0000 mg | Freq: Once | INTRAMUSCULAR | Status: AC

## 2022-02-26 MED ORDER — PRAZOSIN HCL 2 MG PO CAPS
2.0000 mg | ORAL_CAPSULE | Freq: Every day | ORAL | Status: DC
  Filled 2022-02-26: qty 1

## 2022-02-26 MED ORDER — HALOPERIDOL LACTATE 5 MG/ML IJ SOLN: 5.0000 mg | Freq: Once | INTRAMUSCULAR | Status: AC

## 2022-02-26 MED ORDER — FLUOXETINE HCL 20 MG PO CAPS
40.0000 mg | ORAL_CAPSULE | Freq: Every day | ORAL | Status: DC
  Administered 2022-02-27: 40 mg via ORAL
  Filled 2022-02-26: qty 2

## 2022-02-26 NOTE — ED Notes (Signed)
Pt refuses medication, states that he does not take any medication.
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
## 2022-02-26 NOTE — ED Notes (Signed)
Pt getting in [employer] officers face screaming upon arrival to ed hallway bed 24 due to security asking to search patient. Pt became irrational and unable to verbally deescalate at this time. IM medications ordered and administered by this RN and Yetta Flock, RN. [employer] officers at bedside- pt in forensic handcuffs for IM medication administration. Pt remains in hand cuffs at this time for safety. [employer] officers that brought pt in at bedside.

## 2022-02-26 NOTE — ED Notes (Signed)
Pt refusing to dress out, but officers searched patient. Pt with no contraband at this time.

## 2022-02-27 DIAGNOSIS — F4312 Post-traumatic stress disorder, chronic: Secondary | ICD-10-CM | POA: Diagnosis not present

## 2022-02-27 DIAGNOSIS — F10229 Alcohol dependence with intoxication, unspecified: Secondary | ICD-10-CM | POA: Diagnosis not present

## 2022-02-27 DIAGNOSIS — F1024 Alcohol dependence with alcohol-induced mood disorder: Secondary | ICD-10-CM | POA: Diagnosis not present

## 2022-02-27 DIAGNOSIS — F10920 Alcohol use, unspecified with intoxication, uncomplicated: Secondary | ICD-10-CM | POA: Diagnosis not present

## 2022-02-27 LAB — URINE DRUG SCREEN, QUALITATIVE (ARMC ONLY)
Amphetamines, Ur Screen: NOT DETECTED
Barbiturates, Ur Screen: NOT DETECTED
Benzodiazepine, Ur Scrn: NOT DETECTED
Cannabinoid 50 Ng, Ur ~~LOC~~: NOT DETECTED
Cocaine Metabolite,Ur ~~LOC~~: NOT DETECTED
MDMA (Ecstasy)Ur Screen: NOT DETECTED
Methadone Scn, Ur: NOT DETECTED
Opiate, Ur Screen: NOT DETECTED
Phencyclidine (PCP) Ur S: NOT DETECTED
Tricyclic, Ur Screen: NOT DETECTED

## 2022-02-27 LAB — RESP PANEL BY RT-PCR (FLU A&B, COVID) ARPGX2
Influenza A by PCR: NEGATIVE
Influenza B by PCR: NEGATIVE
SARS Coronavirus 2 by RT PCR: NEGATIVE

## 2022-02-27 MED ORDER — THIAMINE HCL 100 MG PO TABS
100.0000 mg | ORAL_TABLET | Freq: Every day | ORAL | Status: DC
Start: 2022-02-27 — End: 2022-02-27
  Administered 2022-02-27: 100 mg via ORAL
  Filled 2022-02-27: qty 1

## 2022-02-27 MED ORDER — LORAZEPAM 2 MG PO TABS
0.0000 mg | ORAL_TABLET | Freq: Four times a day (QID) | ORAL | Status: DC
  Administered 2022-02-27: 2 mg via ORAL
  Filled 2022-02-27: qty 1

## 2022-02-27 MED ORDER — LORAZEPAM 2 MG/ML IJ SOLN: 0.0000 mg | Freq: Two times a day (BID) | INTRAMUSCULAR | Status: DC

## 2022-02-27 MED ORDER — THIAMINE HCL 100 MG/ML IJ SOLN: 100.0000 mg | Freq: Every day | INTRAMUSCULAR | Status: DC

## 2022-02-27 MED ORDER — CHLORDIAZEPOXIDE HCL 10 MG PO CAPS: ORAL_CAPSULE | ORAL | 0 refills | Status: AC

## 2022-02-27 MED ORDER — LORAZEPAM 2 MG/ML IJ SOLN: 0.0000 mg | Freq: Four times a day (QID) | INTRAMUSCULAR | Status: DC

## 2022-02-27 MED ORDER — LORAZEPAM 2 MG PO TABS: 0.0000 mg | ORAL_TABLET | Freq: Two times a day (BID) | ORAL | Status: DC
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
## 2022-02-27 NOTE — ED Notes (Signed)
Pt given breakfast tray at this time. 

## 2022-02-27 NOTE — ED Notes (Signed)
Assumed care from [place], [place]. Pt resting in bed.

## 2022-02-27 NOTE — Consult Note (Signed)
[facility] Consult   Reason for Consult: verbal argument with his wife/alcohol misuse Referring Physician:  Paduchowski Patient Identification: Ivan Hamilton MRN:  [id number] Principal Diagnosis: Alcohol intoxication ([place]) Diagnosis:  Principal Problem:   Alcohol intoxication ([place]) Active Problems:   Chronic post-traumatic stress disorder (PTSD)   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[place])   Total Time spent with patient: 45 minutes  Subjective:   Ivan Hamilton is a 56 y.o. male patient admitted with alcohol intoxication .  HPI: Patient states that he called 911 to come to the hospital after he was intoxicated and an argument with his wife.  Patient reports that when he left the hospital in April he did see his outpatient provider who gave him some medication to help him stop drinking.  He ran out of the medication and is waiting for a refill.  Patient also states that he has made contact with [facility] and is prepared to go to meetings there.  He also states that he recently got insurance for his vehicle and will be able to drive to [employer].  On evaluation, patient is alert and oriented x 3.  He speaks in coherent, lucid sentences.  He denies suicidal or homicidal ideation.  Denies auditory or visual hallucinations.  He does not appear to be responding to internal stimuli.  He appears to be more committed than this Probation officer has seen him in the past 2 working through his trauma.  He states that he knows he needs to be around people more and will be going to groups at [facility] and also talks about taking an [place] as a second language classes at the local community college.  Patient reports that his wife works a lot.  She was working all weekend and that is when he started drinking.  He states that when he is by himself that is when it is difficult to not drink.  He says when he is active in some sort of activity  or is around people he is not tempted to drink alcohol.    EDP wrote prescription for Librium for patient and patient will follow up with his outpatient provider to continue. Patient expresses that he knows he needs to stop alcohol use because "my liver is already damaged."  Patient no longer meets criteria for involuntary commitment.   Past Psychiatric History: PTSD; Alcohol misuse  Risk to Self:   Risk to Others:   Prior Inpatient Therapy:   Prior Outpatient Therapy:    Past Medical History:  Past Medical History:  Diagnosis Date   Alcohol withdrawal ([place])    B12 deficiency    Chronic pain    CVA (cerebral vascular accident) ([place])    Hypertension    Hypothyroidism    Varicose veins     Past Surgical History:  Procedure Laterality Date   CHOLECYSTECTOMY     COLONOSCOPY WITH PROPOFOL N/A 03/22/2021   Procedure: COLONOSCOPY WITH PROPOFOL;  Surgeon: Toledo, Benay Pike, MD;  Location: [facility] ENDOSCOPY;  Service: Gastroenterology;  Laterality: N/A;  SPANISH INTERPRETER   ESOPHAGOGASTRODUODENOSCOPY N/A 03/22/2021   Procedure: ESOPHAGOGASTRODUODENOSCOPY (EGD);  Surgeon: Toledo, Benay Pike, MD;  Location: [facility] ENDOSCOPY;  Service: Gastroenterology;  Laterality: N/A;   ESOPHAGOGASTRODUODENOSCOPY (EGD) WITH PROPOFOL N/A 10/24/2017   Procedure: ESOPHAGOGASTRODUODENOSCOPY (EGD) WITH PROPOFOL;  Surgeon: [name], MD;  Location: [facility] ENDOSCOPY;  Service: Endoscopy;  Laterality: N/A;   ESOPHAGOGASTRODUODENOSCOPY (EGD) WITH PROPOFOL N/A 07/12/2019   Procedure: ESOPHAGOGASTRODUODENOSCOPY (EGD) WITH PROPOFOL;  Surgeon: Jonathon Bellows, MD;  Location: [facility] ENDOSCOPY;  Service: Gastroenterology;  Laterality: N/A;   LEG SURGERY     Family History: History reviewed. No pertinent family history. Family Psychiatric  History:  Social History:  Social History   Substance and Sexual Activity  Alcohol Use Yes   Alcohol/week: 21.0 standard drinks of alcohol   Types: 21 Shots of liquor per week   Comment:  daily- 1 pint of vodka     Social History   Substance and Sexual Activity  Drug Use No    Social History   Socioeconomic History   Marital status: Married    Spouse name: Not on file   Number of children: Not on file   Years of education: Not on file   Highest education level: Not on file  Occupational History   Not on file  Tobacco Use   Smoking status: Never   Smokeless tobacco: Never  Vaping Use   Vaping Use: Never used  Substance and Sexual Activity   Alcohol use: Yes    Alcohol/week: 21.0 standard drinks of alcohol    Types: 21 Shots of liquor per week    Comment: daily- 1 pint of vodka   Drug use: No   Sexual activity: Not on file  Other Topics Concern   Not on file  Social History Narrative   Not on file   Social Determinants of Health   Financial Resource Strain: Not on file  Food Insecurity: Not on file  Transportation Needs: Not on file  Physical Activity: Not on file  Stress: Not on file  Social Connections: Not on file   Additional Social History:    Allergies:  No Known Allergies  Labs:  Results for orders placed or performed during the hospital encounter of 02/26/22 (from the past 48 hour(s))  Comprehensive metabolic panel     Status: Abnormal   Collection Time: 02/26/22 10:01 PM  Result Value Ref Range   Sodium 140 135 - 145 mmol/L   Potassium 4.0 3.5 - 5.1 mmol/L   Chloride 110 98 - 111 mmol/L   CO2 20 (L) 22 - 32 mmol/L   Glucose, Bld 108 (H) 70 - 99 mg/dL    Comment: Glucose reference range applies only to samples taken after fasting for at least 8 hours.   BUN 8 6 - 20 mg/dL   Creatinine, Ser 0.90 0.61 - 1.24 mg/dL   Calcium 8.9 8.9 - 10.3 mg/dL   Total Protein 9.5 (H) 6.5 - 8.1 g/dL   Albumin 4.8 3.5 - 5.0 g/dL   AST 79 (H) 15 - 41 U/L   ALT 59 (H) 0 - 44 U/L   Alkaline Phosphatase 82 38 - 126 U/L   Total Bilirubin 0.9 0.3 - 1.2 mg/dL   GFR, Estimated >60 >60 mL/min    Comment: (NOTE) Calculated using the CKD-EPI Creatinine  Equation (2021)    Anion gap 10 5 - 15    Comment: Performed at [facility], [address] [place], [place] [address]  Ethanol     Status: Abnormal   Collection Time: 02/26/22 10:01 PM  Result Value Ref Range   Alcohol, Ethyl (B) 390 (HH) <10 mg/dL    Comment: CRITICAL RESULT CALLED TO, READ BACK BY AND VERIFIED WITH ANNIE SMITH '@2226'$  ON 02/26/22 [facility] (NOTE) Lowest detectable limit for serum alcohol is 10 mg/dL.  For medical purposes only. Performed at [facility], [address], [place], [place] [address]   Salicylate level  Status: Abnormal   Collection Time: 02/26/22 10:01 PM  Result Value Ref Range   Salicylate Lvl <2.7 (L) 7.0 - 30.0 mg/dL    Comment: Performed at [facility], [address] [place], [place] [address]  Acetaminophen level     Status: Abnormal   Collection Time: 02/26/22 10:01 PM  Result Value Ref Range   Acetaminophen (Tylenol), Serum <10 (L) 10 - 30 ug/mL    Comment: (NOTE) Therapeutic concentrations vary significantly. A range of 10-30 ug/mL  may be an effective concentration for many patients. However, some  are best treated at concentrations outside of this range. Acetaminophen concentrations >150 ug/mL at 4 hours after ingestion  and >50 ug/mL at 12 hours after ingestion are often associated with  toxic reactions.  Performed at [facility], [address] [place], [place] [address]   cbc     Status: None   Collection Time: 02/26/22 10:01 PM  Result Value Ref Range   WBC 8.4 4.0 - 10.5 K/uL   RBC 5.29 4.22 - 5.81 MIL/uL   Hemoglobin 16.1 13.0 - 17.0 g/dL   HCT 48.4 39.0 - 52.0 %   MCV 91.5 80.0 - 100.0 fL   MCH 30.4 26.0 - 34.0 pg   MCHC 33.3 30.0 - 36.0 g/dL   RDW 13.7 11.5 - 15.5 %   Platelets 267 150 - 400 K/uL   nRBC 0.0 0.0 - 0.2 %    Comment: Performed at [facility], [address] [place], [place] [address]  Resp Panel by RT-PCR (Flu A&B, Covid) Anterior Nasal Swab      Status: None   Collection Time: 02/26/22 11:44 PM   Specimen: Anterior Nasal Swab  Result Value Ref Range   SARS Coronavirus 2 by RT PCR NEGATIVE NEGATIVE    Comment: (NOTE) SARS-CoV-2 target nucleic acids are NOT DETECTED.  The SARS-CoV-2 RNA is generally detectable in upper respiratory specimens during the acute phase of infection. The lowest concentration of SARS-CoV-2 viral copies this assay can detect is 138 copies/mL. A negative result does not preclude SARS-Cov-2 infection and should not be used as the sole basis for treatment or other patient management decisions. A negative result may occur with  improper specimen collection/handling, submission of specimen other than nasopharyngeal swab, presence of viral mutation(s) within the areas targeted by this assay, and inadequate number of viral copies(<138 copies/mL). A negative result must be combined with clinical observations, patient history, and epidemiological information. The expected result is Negative.  Fact Sheet for Patients:  EntrepreneurPulse.com.au  Fact Sheet for Healthcare Providers:  IncredibleEmployment.be  This test is no t yet approved or cleared by the [place] FDA and  has been authorized for detection and/or diagnosis of SARS-CoV-2 by FDA under an Emergency Use Authorization (EUA). This EUA will remain  in effect (meaning this test can be used) for the duration of the COVID-19 declaration under Section 564(b)(1) of the Act, 21 U.S.C.section 360bbb-3(b)(1), unless the authorization is terminated  or revoked sooner.       Influenza A by PCR NEGATIVE NEGATIVE   Influenza B by PCR NEGATIVE NEGATIVE    Comment: (NOTE) The Xpert Xpress SARS-CoV-2/FLU/RSV plus assay is intended as an aid in the diagnosis of influenza from Nasopharyngeal swab specimens and should not be used as a sole basis for treatment. Nasal washings and aspirates are unacceptable for Xpert Xpress  SARS-CoV-2/FLU/RSV testing.  Fact Sheet for Patients: EntrepreneurPulse.com.au  Fact Sheet for Healthcare Providers: IncredibleEmployment.be  This test  is not yet approved or cleared by the [place] and has been authorized for detection and/or diagnosis of SARS-CoV-2 by FDA under an Emergency Use Authorization (EUA). This EUA will remain in effect (meaning this test can be used) for the duration of the COVID-19 declaration under Section 564(b)(1) of the Act, 21 U.S.C. section 360bbb-3(b)(1), unless the authorization is terminated or revoked.  Performed at [facility], [address] [place], [place] [address]   Urine Drug Screen, Qualitative     Status: None   Collection Time: 02/27/22  1:05 AM  Result Value Ref Range   Tricyclic, Ur Screen NONE DETECTED NONE DETECTED   Amphetamines, Ur Screen NONE DETECTED NONE DETECTED   MDMA (Ecstasy)Ur Screen NONE DETECTED NONE DETECTED   Cocaine Metabolite,Ur Loving NONE DETECTED NONE DETECTED   Opiate, Ur Screen NONE DETECTED NONE DETECTED   Phencyclidine (PCP) Ur S NONE DETECTED NONE DETECTED   Cannabinoid 50 Ng, Ur  NONE DETECTED NONE DETECTED   Barbiturates, Ur Screen NONE DETECTED NONE DETECTED   Benzodiazepine, Ur Scrn NONE DETECTED NONE DETECTED   Methadone Scn, Ur NONE DETECTED NONE DETECTED    Comment: (NOTE) Tricyclics + metabolites, urine    Cutoff 1000 ng/mL Amphetamines + metabolites, urine  Cutoff 1000 ng/mL MDMA (Ecstasy), urine              Cutoff 500 ng/mL Cocaine Metabolite, urine          Cutoff 300 ng/mL Opiate + metabolites, urine        Cutoff 300 ng/mL Phencyclidine (PCP), urine         Cutoff 25 ng/mL Cannabinoid, urine                 Cutoff 50 ng/mL Barbiturates + metabolites, urine  Cutoff 200 ng/mL Benzodiazepine, urine              Cutoff 200 ng/mL Methadone, urine                   Cutoff 300 ng/mL  The urine drug screen provides only a preliminary,  unconfirmed analytical test result and should not be used for non-medical purposes. Clinical consideration and professional judgment should be applied to any positive drug screen result due to possible interfering substances. A more specific alternate chemical method must be used in order to obtain a confirmed analytical result. Gas chromatography / mass spectrometry (GC/MS) is the preferred confirm atory method. Performed at [facility], [address] [place], [place] [address]     Current Facility-Administered Medications  Medication Dose Route Frequency Provider Last Rate Last Admin   busPIRone (BUSPAR) tablet 10 mg  10 mg Oral TID Ward, Kristen N, DO   10 mg at 02/27/22 1124   FLUoxetine (PROZAC) capsule 40 mg  40 mg Oral Daily Ward, Kristen N, DO   40 mg at 02/27/22 1124   gabapentin (NEURONTIN) capsule 800 mg  800 mg Oral TID Ward, Kristen N, DO   800 mg at 02/27/22 1124   levothyroxine (SYNTHROID) tablet 125 mcg  125 mcg Oral Q0600 Ward, Kristen N, DO       lisinopril (ZESTRIL) tablet 5 mg  5 mg Oral Daily Ward, Kristen N, DO   5 mg at 02/27/22 1124   LORazepam (ATIVAN) injection 0-4 mg  0-4 mg Intravenous Q6H Duffy Bruce, MD       Or   LORazepam (ATIVAN) tablet 0-4 mg  0-4 mg Oral Q6H Duffy Bruce, MD  2 mg at 02/27/22 0017   [START ON 03/01/2022] LORazepam (ATIVAN) injection 0-4 mg  0-4 mg Intravenous Q12H Duffy Bruce, MD       Or   Derrill Memo ON 03/01/2022] LORazepam (ATIVAN) tablet 0-4 mg  0-4 mg Oral Q12H Duffy Bruce, MD       pantoprazole (PROTONIX) EC tablet 40 mg  40 mg Oral Daily Ward, Kristen N, DO   40 mg at 02/27/22 1125   prazosin (MINIPRESS) capsule 2 mg  2 mg Oral QHS Ward, Kristen N, DO       QUEtiapine (SEROQUEL) tablet 50 mg  50 mg Oral QHS Ward, Kristen N, DO       thiamine (VITAMIN B1) tablet 100 mg  100 mg Oral Daily Duffy Bruce, MD   100 mg at 02/27/22 1124   Or   thiamine (VITAMIN B1) injection 100 mg  100 mg Intravenous Daily  Duffy Bruce, MD       Current Outpatient Medications  Medication Sig Dispense Refill   busPIRone (BUSPAR) 15 MG tablet Take 15 mg by mouth 2 (two) times daily.     chlordiazePOXIDE (LIBRIUM) 10 MG capsule Day 1-2: Take 1 tablet p.o. every 8 hours. Day 3-4: Take 1 tablet p.o. every 12 hours. Day 5-6: Take 1 tablet p.o. daily. Day 7: Stop 30 capsule 0   FLUoxetine (PROZAC) 40 MG capsule Take 1 capsule (40 mg total) by mouth daily. 30 capsule 1   gabapentin (NEURONTIN) 400 MG capsule Take 2 capsules (800 mg total) by mouth 3 (three) times daily. 180 capsule 1   levothyroxine (SYNTHROID) 125 MCG tablet Take 1 tablet (125 mcg total) by mouth daily at 6 (six) AM. 30 tablet 1   lisinopril (ZESTRIL) 5 MG tablet Take 1 tablet (5 mg total) by mouth daily. 30 tablet 1   pantoprazole (PROTONIX) 40 MG tablet Take 1 tablet (40 mg total) by mouth daily. 30 tablet 1   prazosin (MINIPRESS) 2 MG capsule Take 1 capsule (2 mg total) by mouth at bedtime. 30 capsule 1   QUEtiapine (SEROQUEL) 200 MG tablet Take 200 mg by mouth at bedtime.     traZODone (DESYREL) 150 MG tablet Take 150 mg by mouth at bedtime.      Musculoskeletal: Strength & Muscle Tone: within normal limits Gait & Station: normal Patient leans: N/A            Psychiatric Specialty Exam:  Presentation  General Appearance: Appropriate for Environment  Eye Contact:Good  Speech:Clear and Coherent  Speech Volume:Normal  Handedness:Right   Mood and Affect  Mood:Euthymic  Affect:Congruent   Thought Process  Thought Processes:Coherent  Descriptions of Associations:Intact  Orientation:Full (Time, Place and Person)  Thought Content:WDL; Logical  History of Schizophrenia/Schizoaffective disorder:No  Duration of Psychotic Symptoms:Greater than six months  Hallucinations:Hallucinations: None  Ideas of Reference:None  Suicidal Thoughts:Suicidal Thoughts: No  Homicidal Thoughts:Homicidal Thoughts:  No   Sensorium  Memory:Recent Good; Immediate Good  Judgment:Fair  Insight:Fair   Executive Functions  Concentration:Good  Attention Span:Good  [place] of Knowledge:Good  Language:Good   Psychomotor Activity  Psychomotor Activity:Psychomotor Activity: Normal  Assets  Assets:Communication Skills; Desire for Improvement; Financial Resources/Insurance; Housing; Resilience   Sleep  Sleep:Sleep: Fair  Physical Exam: Physical Exam Vitals and nursing note reviewed.  HENT:     Head: Normocephalic.     Nose: No congestion or rhinorrhea.  Eyes:     General:        Right eye: No discharge.  Left eye: No discharge.  Cardiovascular:     Rate and Rhythm: Normal rate.  Pulmonary:     Effort: Pulmonary effort is normal.  Musculoskeletal:        General: Normal range of motion.     Cervical back: Normal range of motion.  Skin:    General: Skin is dry.  Neurological:     Mental Status: He is alert and oriented to person, place, and time.  Psychiatric:        Attention and Perception: Attention normal.        Mood and Affect: Mood normal.        Speech: Speech normal.        Behavior: Behavior normal. Behavior is cooperative.        Thought Content: Thought content normal. Thought content is not paranoid or delusional. Thought content does not include homicidal or suicidal ideation.        Cognition and Memory: Cognition normal.        Judgment: Judgment normal.   Review of Systems  Constitutional: Negative.   HENT: Negative.    Eyes: Negative.   Respiratory: Negative.    Cardiovascular: Negative.   Skin: Negative.   Neurological: Negative.   Psychiatric/Behavioral:  Positive for depression (stable) and substance abuse. Negative for hallucinations, memory loss and suicidal ideas. The patient is not nervous/anxious and does not have insomnia.    Blood pressure 115/81, pulse 92, temperature 98.3 F (36.8 C), temperature source Oral, resp. rate 18,  height '5\' 5"'$  (1.651 m), weight 75.8 kg, SpO2 96 %. Body mass index is 27.79 kg/m.  Treatment Plan Summary: Patient denies suicidal or homicidal ideation. Denies auditory or visual hallucinations. Patient exhibits insight into the need for alcohol misuse treatment and is the process of getting treatment with [facility] and will call his outpatient provider for medication assistance. Reviewed with EDP  Disposition: No evidence of imminent risk to self or others at present.   Patient does not meet criteria for psychiatric inpatient admission. Supportive therapy provided about ongoing stressors. Discussed crisis plan, support from social network, calling 911, coming to the Emergency Department, and calling Suicide Hotline.  Sherlon Handing, NP 02/27/2022 12:55 PM

## 2022-02-27 NOTE — BH Assessment (Deleted)
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 -- Risk manager)  What Is the Reason for Your Visit/Call Today? Patient was intoxicated and called the police to bring him to the ED.  How Long Has This Been Causing You Problems? > than 6 months  What Do You Feel Would Help You the Most Today? Alcohol or Drug Use Treatment; Medication(s)   Have You Recently Had Any Thoughts About Hurting Yourself? No  Are You Planning to Commit Suicide/Harm Yourself At This time? No   Have you Recently Had Thoughts About [place]? No  Are You Planning to Harm Someone at This Time? No  Explanation: No data recorded  Have You Used Any Alcohol or Drugs in the Past 24 Hours? Yes  How Long Ago Did You Use Drugs or Alcohol? No data recorded What Did You Use and How Much? Alcohol   Do You Currently Have a Therapist/Psychiatrist? No  Name of Therapist/Psychiatrist: [place] Recently Discharged From Any Office Practice or Programs? No  Explanation of Discharge From Practice/Program: No data recorded   CCA Screening Triage Referral Assessment Type of Contact: Face-to-Face  Telemedicine Service Delivery:   Is this Initial or Reassessment? No data recorded Date Telepsych consult ordered in [facility]:  No data recorded Time Telepsych consult ordered in [facility]:  No data recorded Location of Assessment: [facility] ED  Provider Location: [facility] ED   Collateral Involvement: None provided   Does Patient Have a [place]? No data recorded Name and Contact of Legal Guardian: No data recorded If Minor and Not Living with Parent(s), Who has Custody? n/a  Is CPS involved or ever been  involved? Never  Is APS involved or ever been involved? Never   Patient Determined To Be At Risk for Harm To Self or Others Based on Review of Patient Reported Information or Presenting Complaint? No  Method: No data recorded Availability of Means: No data recorded Intent: No data recorded Notification Required: No data recorded Additional Information for Danger to Others Potential: No data recorded Additional Comments for Danger to Others Potential: No data recorded Are There Guns or Other Weapons in Your Home? No data recorded Types of Guns/Weapons: No data recorded Are These Weapons Safely Secured?                            No data recorded Who Could Verify You Are Able To Have These Secured: No data recorded Do You Have any Outstanding Charges, Pending Court Dates, Parole/Probation? No data recorded Contacted To Inform of Risk of Harm To Self or Others: No data recorded  Does Patient Present under Involuntary Commitment? No  IVC Papers Initial File Date: 10/17/21   [place] of Residence: [place]   Patient Currently Receiving the Following Services: Medication Management   Determination of Need: Emergent (2 hours)   Options For Referral: ED Visit; Intensive Outpatient Therapy; Chemical Dependency Intensive Outpatient Therapy (CDIOP); Medication Management   Discharge Disposition:     Eula Fried, Counselor, LCAS- A

## 2022-02-27 NOTE — ED Notes (Signed)
Interpreter requested for discharged instructions

## 2022-02-27 NOTE — ED Notes (Signed)
Hospital meal provided, pt tolerated w/o complaints.  Waste discarded appropriately.  

## 2022-02-27 NOTE — ED Notes (Addendum)
Interpreter at bedside. Discharge instructions reviewed. This RN stressing the importance of not drinking alcohol while taking prescribed librium. Pt verbalized understanding

## 2022-02-27 NOTE — ED Provider Notes (Signed)
[facility] Provider Note    Event Date/Time   First MD Initiated Contact with Patient 02/26/22 2220     (approximate)   History   Suicidal   HPI  Ivan Hamilton is a 56 y.o. male with extensive past medical history brought in with suicidal ideation.  Patient arrives via BPD under voluntary commitment.  Patient reportedly was yelling at a male who please state with his ex-wife.  He was requested to be brought to the hospital because he thought about harming himself with a machete.  Patient has been drinking alcohol.  Patient does admit to SI with plan to shoot himself in the head with a gun or cut himself with a machete.  Remainder of history limited due to intoxication.     Physical Exam   Triage Vital Signs: ED Triage Vitals  Enc Vitals Group     BP 02/26/22 2133 (!) 132/103     Pulse Rate 02/26/22 2133 (!) 110     Resp 02/26/22 2133 20     Temp 02/26/22 2133 98.2 F (36.8 C)     Temp Source 02/26/22 2133 Oral     SpO2 02/26/22 2133 94 %     Weight 02/26/22 2134 167 lb (75.8 kg)     Height 02/26/22 2134 '5\' 5"'$  (1.651 m)     Head Circumference --      Peak Flow --      Pain Score --      Pain Loc --      Pain Edu? --      Excl. in [place]? --     Most recent vital signs: Vitals:   02/26/22 2133  BP: (!) 132/103  Pulse: (!) 110  Resp: 20  Temp: 98.2 F (36.8 C)  SpO2: 94%     General: Awake, no distress.  CV:  Good peripheral perfusion.  Resp:  Normal effort.  Abd:  No distention.  Other:  Intoxicated, speech slightly slurred.  No focal deficits.  Endorses active SI.   ED Results / Procedures / Treatments   Labs (all labs ordered are listed, but only abnormal results are displayed) Labs Reviewed  COMPREHENSIVE METABOLIC PANEL - Abnormal; Notable for the following components:      Result Value   CO2 20 (*)    Glucose, Bld 108 (*)    Total Protein 9.5 (*)    AST 79 (*)    ALT 59 (*)    All other components within normal  limits  ETHANOL - Abnormal; Notable for the following components:   Alcohol, Ethyl (B) 390 (*)    All other components within normal limits  SALICYLATE LEVEL - Abnormal; Notable for the following components:   Salicylate Lvl <9.9 (*)    All other components within normal limits  ACETAMINOPHEN LEVEL - Abnormal; Notable for the following components:   Acetaminophen (Tylenol), Serum <10 (*)    All other components within normal limits  RESP PANEL BY RT-PCR (FLU A&B, COVID) ARPGX2  CBC  URINE DRUG SCREEN, QUALITATIVE ([facility] ONLY)     EKG    RADIOLOGY    I also independently reviewed and agree with radiologist interpretations.   PROCEDURES:  Critical Care performed: No   MEDICATIONS ORDERED IN ED: Medications  busPIRone (BUSPAR) tablet 10 mg (10 mg Oral Patient Refused/Not Given 02/26/22 2347)  FLUoxetine (PROZAC) capsule 40 mg (has no administration in time range)  gabapentin (NEURONTIN) capsule 800 mg (800 mg Oral Patient Refused/Not Given 02/26/22  2347)  levothyroxine (SYNTHROID) tablet 125 mcg (has no administration in time range)  lisinopril (ZESTRIL) tablet 5 mg (has no administration in time range)  pantoprazole (PROTONIX) EC tablet 40 mg (has no administration in time range)  prazosin (MINIPRESS) capsule 2 mg (2 mg Oral Patient Refused/Not Given 02/26/22 2348)  QUEtiapine (SEROQUEL) tablet 50 mg (50 mg Oral Patient Refused/Not Given 02/26/22 2347)  LORazepam (ATIVAN) injection 0-4 mg ( Intravenous See Alternative 02/27/22 0017)    Or  LORazepam (ATIVAN) tablet 0-4 mg (2 mg Oral Given 02/27/22 0017)  LORazepam (ATIVAN) injection 0-4 mg (has no administration in time range)    Or  LORazepam (ATIVAN) tablet 0-4 mg (has no administration in time range)  thiamine (VITAMIN B1) tablet 100 mg (has no administration in time range)    Or  thiamine (VITAMIN B1) injection 100 mg (has no administration in time range)  diphenhydrAMINE (BENADRYL) injection 50 mg (50 mg Intramuscular Given  02/26/22 2216)  haloperidol lactate (HALDOL) injection 5 mg (5 mg Intramuscular Given 02/26/22 2216)  LORazepam (ATIVAN) injection 2 mg (2 mg Intramuscular Given 02/26/22 2216)     IMPRESSION / MDM / ASSESSMENT AND PLAN / ED COURSE  I reviewed the triage vital signs and the nursing notes.                               The patient is on the cardiac monitor to evaluate for evidence of arrhythmia and/or significant heart rate changes.   Ddx:  Differential includes the following, with pertinent life- or limb-threatening emergencies considered:  Substance induced mood d/o, depression  Patient's presentation is most consistent with acute presentation with potential threat to life or bodily function.  MDM:  56 yo M with extensive PMHx depression, substance abuse here with SI. Pt agitated, combative requiring IM sedation. IVC placed. Labs show marked ethanol intoxication but o/w are largely unremarkable. No signs of head trauma and no focal neuro deficits. Will place on CIWA, order TTS/Psych for evaluation.    MEDICATIONS GIVEN IN ED: Medications  busPIRone (BUSPAR) tablet 10 mg (10 mg Oral Patient Refused/Not Given 02/26/22 2347)  FLUoxetine (PROZAC) capsule 40 mg (has no administration in time range)  gabapentin (NEURONTIN) capsule 800 mg (800 mg Oral Patient Refused/Not Given 02/26/22 2347)  levothyroxine (SYNTHROID) tablet 125 mcg (has no administration in time range)  lisinopril (ZESTRIL) tablet 5 mg (has no administration in time range)  pantoprazole (PROTONIX) EC tablet 40 mg (has no administration in time range)  prazosin (MINIPRESS) capsule 2 mg (2 mg Oral Patient Refused/Not Given 02/26/22 2348)  QUEtiapine (SEROQUEL) tablet 50 mg (50 mg Oral Patient Refused/Not Given 02/26/22 2347)  LORazepam (ATIVAN) injection 0-4 mg ( Intravenous See Alternative 02/27/22 0017)    Or  LORazepam (ATIVAN) tablet 0-4 mg (2 mg Oral Given 02/27/22 0017)  LORazepam (ATIVAN) injection 0-4 mg (has no administration  in time range)    Or  LORazepam (ATIVAN) tablet 0-4 mg (has no administration in time range)  thiamine (VITAMIN B1) tablet 100 mg (has no administration in time range)    Or  thiamine (VITAMIN B1) injection 100 mg (has no administration in time range)  diphenhydrAMINE (BENADRYL) injection 50 mg (50 mg Intramuscular Given 02/26/22 2216)  haloperidol lactate (HALDOL) injection 5 mg (5 mg Intramuscular Given 02/26/22 2216)  LORazepam (ATIVAN) injection 2 mg (2 mg Intramuscular Given 02/26/22 2216)     Consults:  TTS/Psych pending   EMR  reviewed       FINAL CLINICAL IMPRESSION(S) / ED DIAGNOSES   Final diagnoses:  Suicidal ideation  Alcoholic intoxication without complication ([place])     Rx / DC Orders   ED Discharge Orders     None        Note:  This document was prepared using Dragon voice recognition software and may include unintentional dictation errors.   Duffy Bruce, MD 02/27/22 0200

## 2022-02-27 NOTE — Discharge Instructions (Addendum)
You have been prescribed Librium.  This is a medication that will help with alcohol withdrawal symptoms.  Do not drink alcohol while taking this medication.  Please follow-up with your doctor as well as residential treatment services by calling the number provided to arrange a follow-up appointment.  Return to the emergency department for any symptom personally concerning to yourself.

## 2022-02-27 NOTE — ED Provider Notes (Signed)
Emergency Medicine Observation Re-evaluation Note  Ivan Hamilton is a 56 y.o. male, seen on rounds today.  Pt initially presented to the ED for complaints of Suicidal  Currently, the patient is is no acute distress. Denies any concerns at this time.  Physical Exam  Blood pressure (!) 132/103, pulse (!) 110, temperature 98.2 F (36.8 C), temperature source Oral, resp. rate 20, height '5\' 5"'$  (1.651 m), weight 75.8 kg, SpO2 94 %.  Physical Exam: General: No apparent distress Pulm: Normal WOB Neuro: Moving all extremities Psych: Resting comfortably     ED Course / MDM     I have reviewed the labs performed to date as well as medications administered while in observation.  Recent changes in the last 24 hours include: No acute events overnight.  Plan   Current plan: Patient awaiting psychiatric disposition. Patient is under full IVC at this time.    Ivan Hamilton, Delice Bison, DO 02/27/22 [phone]

## 2022-02-27 NOTE — ED Notes (Signed)
Pt taken to interview room to be reassessed by psych provider

## 2022-02-27 NOTE — ED Provider Notes (Signed)
Patient has been seen and evaluated by psychiatry.  They believe the patient is safe for discharge home from psychiatric standpoint.  Patient has a history of alcohol use disorder, states he wants to stop drinking.  He has taken Librium in the past which has been helpful.  We will prescribe Librium.  Discussed with the patient he is not to drink alcohol if he is taking Librium.  If he is going to drink he needs to discontinue use of Librium prior to drinking.  Patient agreeable.  Spanish interpreter used for this discharge.   Harvest Dark, MD 02/27/22 1145

## 2022-04-04 DIAGNOSIS — J3489 Other specified disorders of nose and nasal sinuses: Secondary | ICD-10-CM | POA: Diagnosis not present

## 2022-04-04 DIAGNOSIS — F10951 Alcohol use, unspecified with alcohol-induced psychotic disorder with hallucinations: Secondary | ICD-10-CM | POA: Diagnosis not present

## 2022-04-04 DIAGNOSIS — L739 Follicular disorder, unspecified: Secondary | ICD-10-CM | POA: Diagnosis not present

## 2022-04-04 DIAGNOSIS — G312 Degeneration of nervous system due to alcohol: Secondary | ICD-10-CM | POA: Diagnosis not present

## 2022-04-04 DIAGNOSIS — F333 Major depressive disorder, recurrent, severe with psychotic symptoms: Secondary | ICD-10-CM | POA: Diagnosis not present

## 2022-04-04 DIAGNOSIS — F411 Generalized anxiety disorder: Secondary | ICD-10-CM | POA: Diagnosis not present

## 2022-04-04 DIAGNOSIS — I8510 Secondary esophageal varices without bleeding: Secondary | ICD-10-CM | POA: Diagnosis not present

## 2022-04-04 DIAGNOSIS — Z0000 Encounter for general adult medical examination without abnormal findings: Secondary | ICD-10-CM | POA: Diagnosis not present

## 2022-04-04 DIAGNOSIS — K7030 Alcoholic cirrhosis of liver without ascites: Secondary | ICD-10-CM | POA: Diagnosis not present

## 2022-04-04 DIAGNOSIS — F1024 Alcohol dependence with alcohol-induced mood disorder: Secondary | ICD-10-CM | POA: Diagnosis not present

## 2022-04-04 DIAGNOSIS — R0981 Nasal congestion: Secondary | ICD-10-CM | POA: Diagnosis not present

## 2022-04-04 DIAGNOSIS — D696 Thrombocytopenia, unspecified: Secondary | ICD-10-CM | POA: Diagnosis not present

## 2022-04-04 DIAGNOSIS — M545 Low back pain, unspecified: Secondary | ICD-10-CM | POA: Diagnosis not present

## 2022-05-04 DIAGNOSIS — Z008 Encounter for other general examination: Secondary | ICD-10-CM | POA: Diagnosis not present

## 2022-05-04 DIAGNOSIS — I10 Essential (primary) hypertension: Secondary | ICD-10-CM | POA: Diagnosis not present

## 2022-05-04 DIAGNOSIS — M545 Low back pain, unspecified: Secondary | ICD-10-CM | POA: Diagnosis not present

## 2022-05-04 DIAGNOSIS — F419 Anxiety disorder, unspecified: Secondary | ICD-10-CM | POA: Diagnosis not present

## 2022-05-04 DIAGNOSIS — F1020 Alcohol dependence, uncomplicated: Secondary | ICD-10-CM | POA: Diagnosis not present

## 2022-05-04 DIAGNOSIS — E663 Overweight: Secondary | ICD-10-CM | POA: Diagnosis not present

## 2022-05-04 DIAGNOSIS — I700 Atherosclerosis of aorta: Secondary | ICD-10-CM | POA: Diagnosis not present

## 2022-05-04 DIAGNOSIS — E039 Hypothyroidism, unspecified: Secondary | ICD-10-CM | POA: Diagnosis not present

## 2022-05-04 DIAGNOSIS — Z6827 Body mass index (BMI) 27.0-27.9, adult: Secondary | ICD-10-CM | POA: Diagnosis not present

## 2022-05-04 DIAGNOSIS — G4700 Insomnia, unspecified: Secondary | ICD-10-CM | POA: Diagnosis not present

## 2022-05-04 DIAGNOSIS — F319 Bipolar disorder, unspecified: Secondary | ICD-10-CM | POA: Diagnosis not present

## 2022-05-16 DIAGNOSIS — R69 Illness, unspecified: Secondary | ICD-10-CM | POA: Diagnosis not present

## 2022-05-27 IMAGING — CT CT HEAD W/O CM
2 of 3 series · 14 of 44 positions shown, 17 images · non-contrast
Comparison: September 07, 2019.

CLINICAL DATA: Suicidal ideation, visual and auditory
hallucinations

EXAM:
CT CERVICAL SPINE WITHOUT CONTRAST
TECHNIQUE: Multidetector CT imaging of the cervical spine was performed without
intravenous contrast. Multiplanar CT image reconstructions were also
generated.

[Series 2: head wo · axial · 0.45mm/px · z∈[-59,+51]mm · 11 of 27 slices shown, 14 images]
[im 3/27  brain]
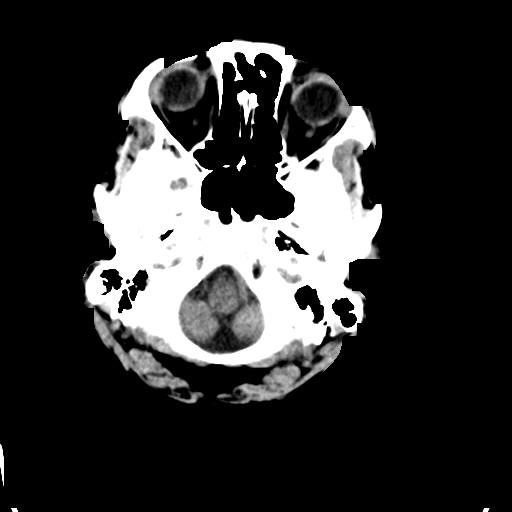
[im 3/27  bone]
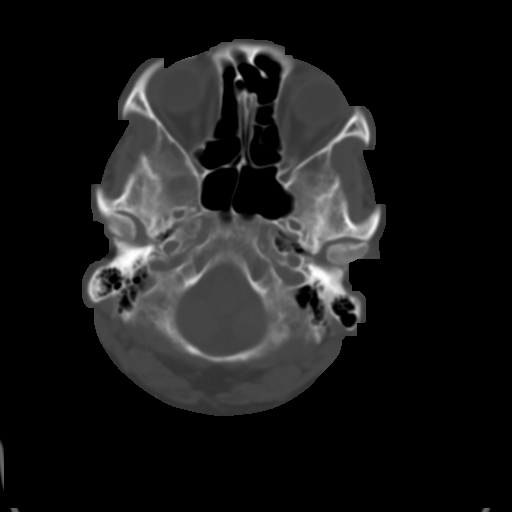
[im 5/27  brain]
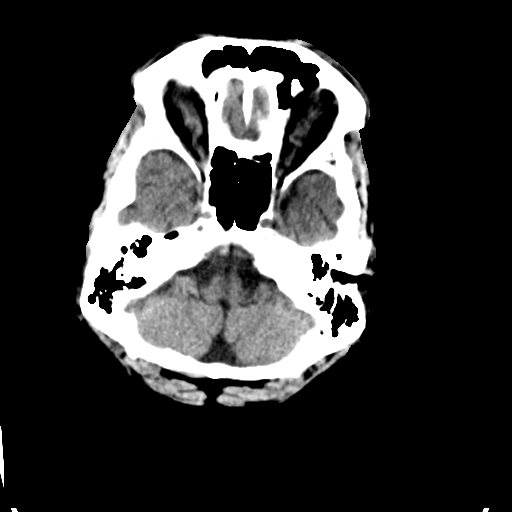
[im 7/27  brain]
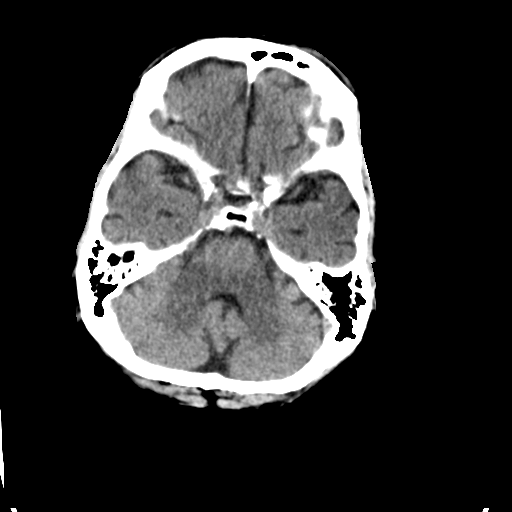
[im 9/27  brain]
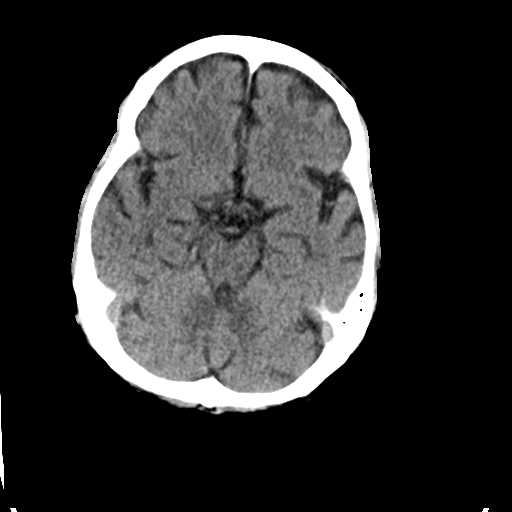
[im 12/27  brain]
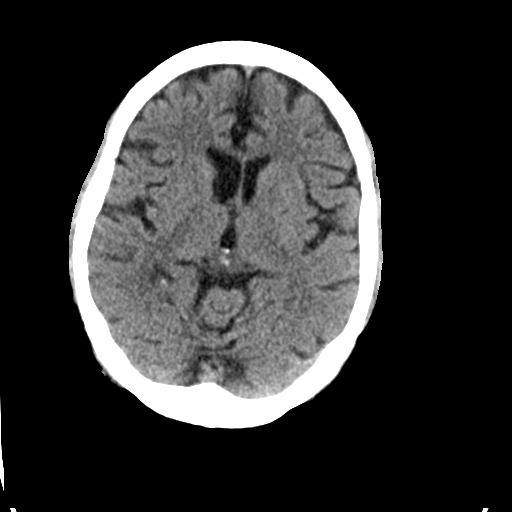
[im 12/27  bone]
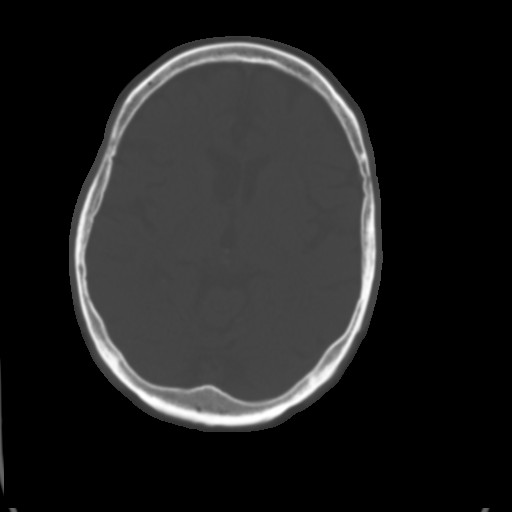
[im 14/27  brain]
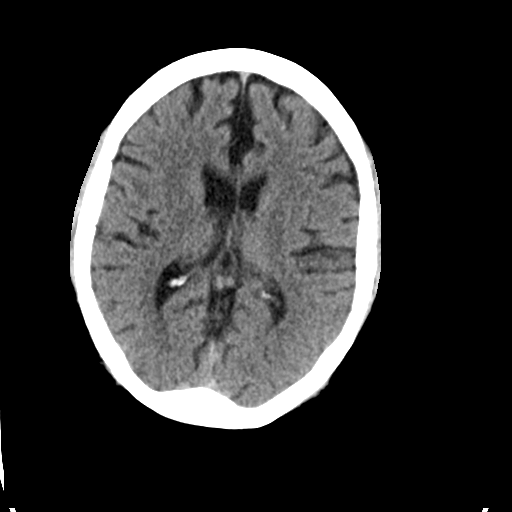
[im 16/27  brain]
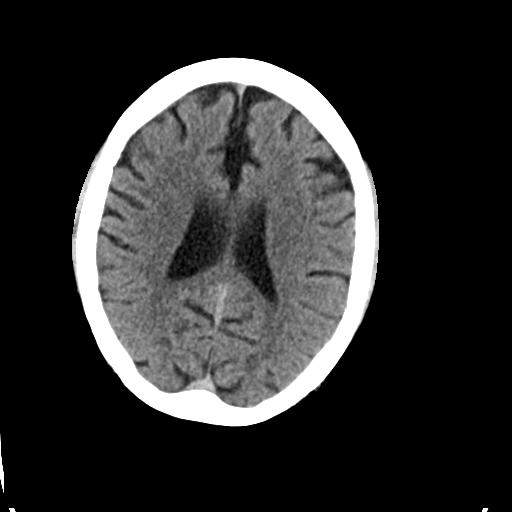
[im 19/27  brain]
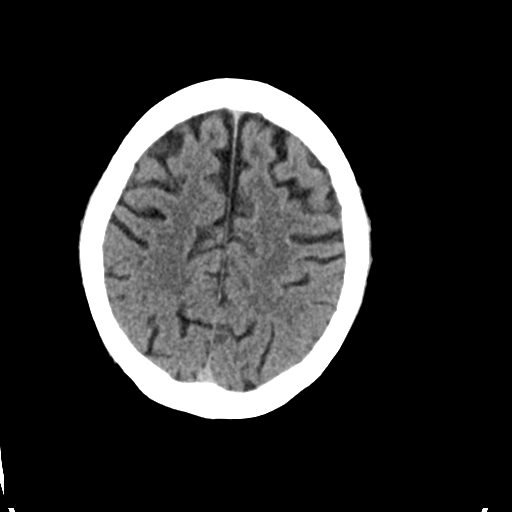
[im 21/27  brain]
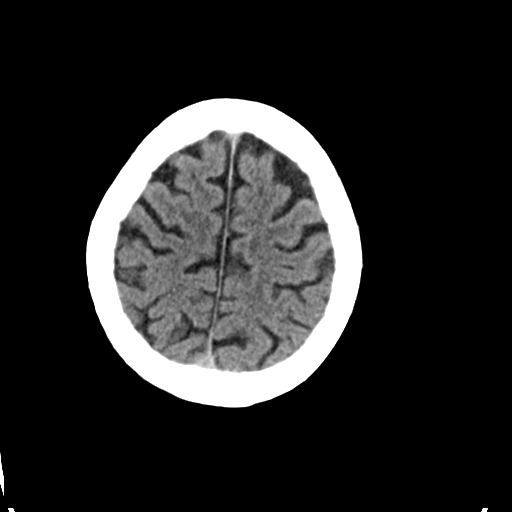
[im 21/27  bone]
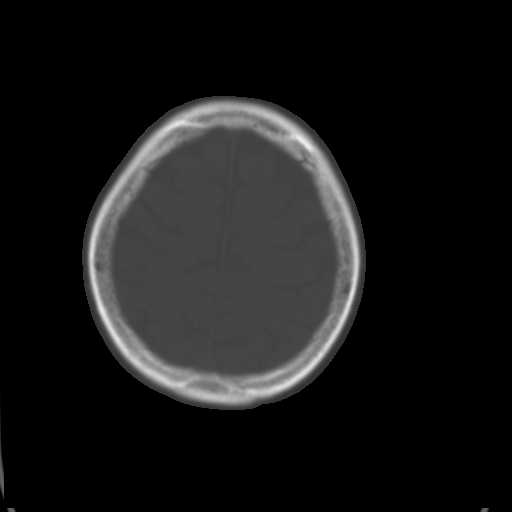
[im 23/27  brain]
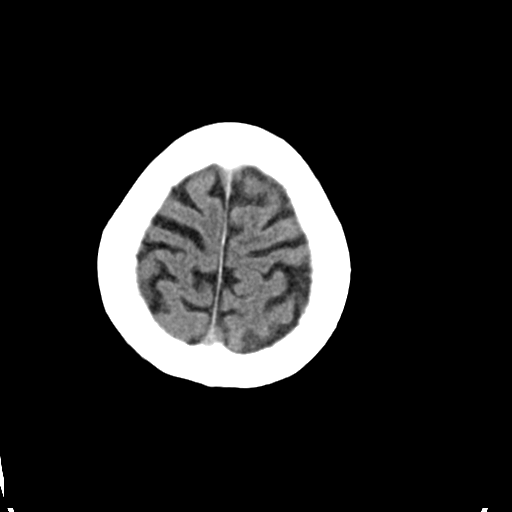
[im 25/27  brain]
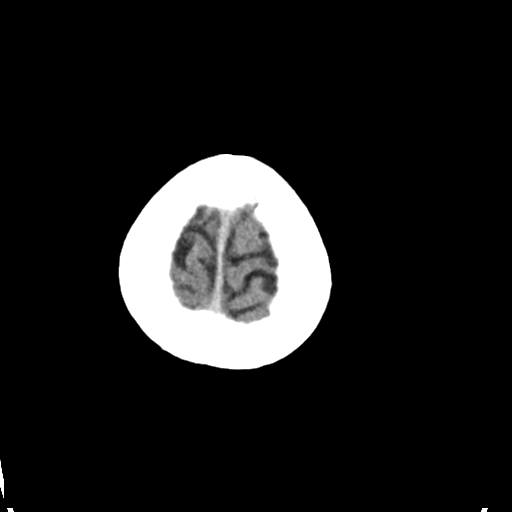

[Series 4: coronal soft tissue · coronal · 0.26mm/px · 3 of 67 slices shown]
[im 30/67  brain]
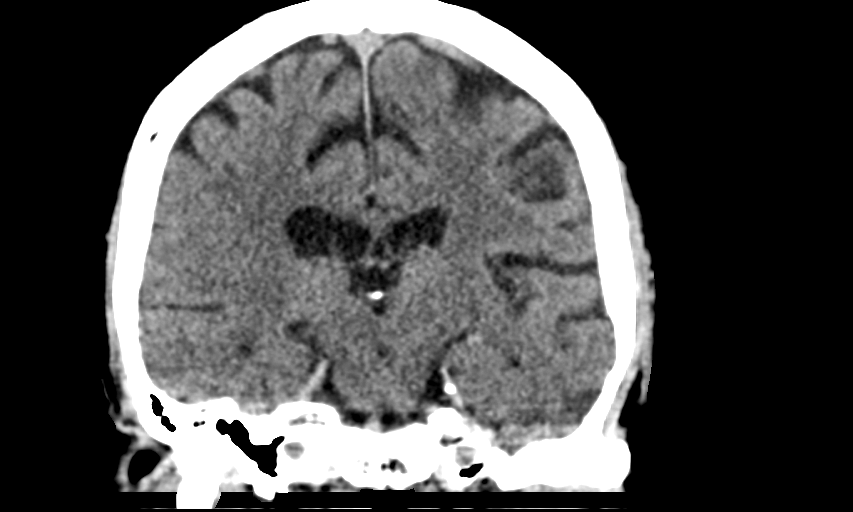
[im 37/67  brain]
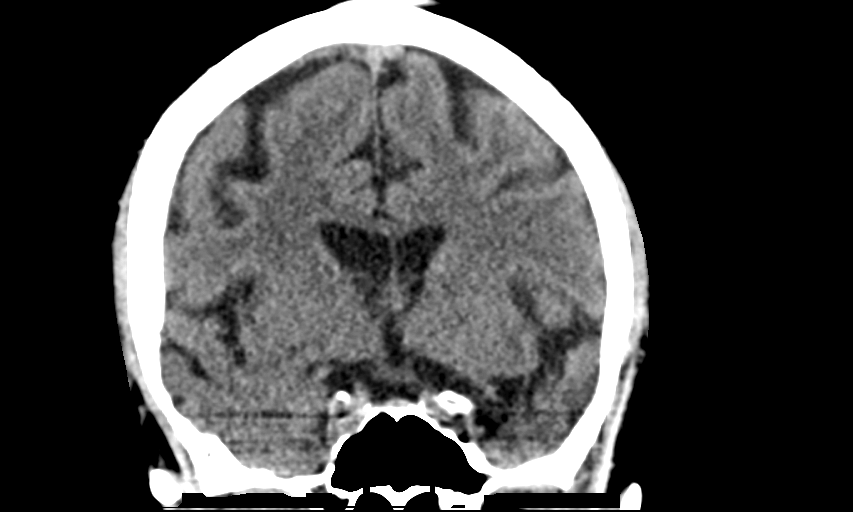
[im 45/67  brain]
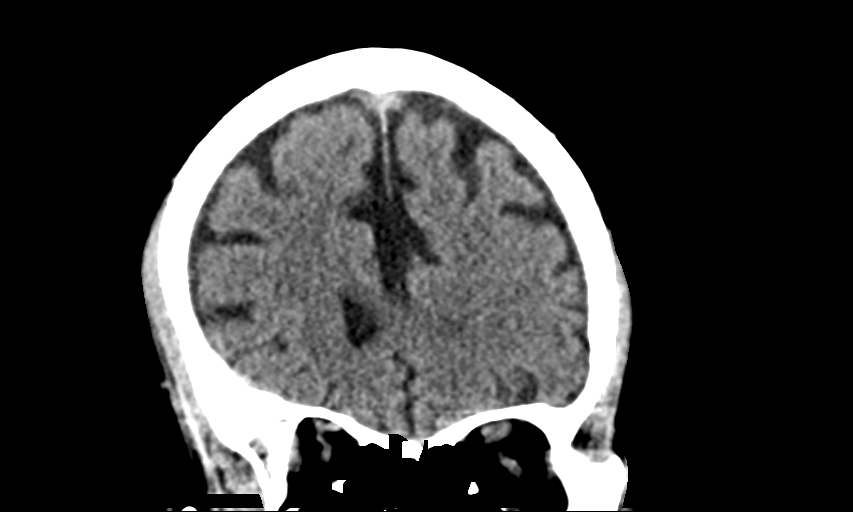

[14 of 44 positions shown; findings below may reference images not displayed]

FINDINGS: Brain: No evidence of acute territorial infarction, hemorrhage,
hydrocephalus,extra-axial collection or mass lesion/mass effect.
There is mild dilatation the ventricles and sulci consistent with
age-related atrophy. Low-attenuation changes in the deep white
matter consistent with small vessel ischemia.

Vascular: No hyperdense vessel or unexpected calcification.

Skull: The skull is intact. No fracture or focal lesion identified.

Sinuses/Orbits: The visualized paranasal sinuses and mastoid air
cells are clear. The orbits and globes intact.

Other: None

Face:

Osseous: No acute fracture or other significant osseous
abnormality.The nasal bone, mandibles, zygomatic arches and
pterygoid plates are intact.

Orbits: No fracture identified. Unremarkable appearance of globes
and orbits.

Sinuses: The visualized paranasal sinuses and mastoid air cells are
unremarkable.

Soft tissues:  No acute findings.

Limited intracranial: No acute findings.

Cervical spine:

Alignment: Physiologic

Skull base and vertebrae: Visualized skull base is intact. No
atlanto-occipital dissociation. The vertebral body heights are well
maintained. No fracture or pathologic osseous lesion seen.

Soft tissues and spinal canal: The visualized paraspinal soft
tissues are unremarkable. No prevertebral soft tissue swelling is
seen. The spinal canal is grossly unremarkable, no large epidural
collection or significant canal narrowing.

Disc levels:  No significant canal or neural foraminal narrowing.

Upper chest: The lung apices are clear. Thoracic inlet is within
normal limits.

Other: None
IMPRESSION: No acute intracranial abnormality.

Findings consistent with mild age related atrophy and chronic small
vessel ischemia

No acute facial fracture

No acute fracture or malalignment of the spine.

## 2022-05-30 DIAGNOSIS — R69 Illness, unspecified: Secondary | ICD-10-CM | POA: Diagnosis not present

## 2022-09-04 DIAGNOSIS — I83819 Varicose veins of unspecified lower extremities with pain: Secondary | ICD-10-CM | POA: Diagnosis not present

## 2022-09-04 DIAGNOSIS — F1020 Alcohol dependence, uncomplicated: Secondary | ICD-10-CM | POA: Diagnosis not present

## 2022-09-04 DIAGNOSIS — G312 Degeneration of nervous system due to alcohol: Secondary | ICD-10-CM | POA: Diagnosis not present

## 2022-09-04 DIAGNOSIS — I8510 Secondary esophageal varices without bleeding: Secondary | ICD-10-CM | POA: Diagnosis not present

## 2022-09-04 DIAGNOSIS — D696 Thrombocytopenia, unspecified: Secondary | ICD-10-CM | POA: Diagnosis not present

## 2022-09-04 DIAGNOSIS — F333 Major depressive disorder, recurrent, severe with psychotic symptoms: Secondary | ICD-10-CM | POA: Diagnosis not present

## 2022-09-04 DIAGNOSIS — E039 Hypothyroidism, unspecified: Secondary | ICD-10-CM | POA: Diagnosis not present

## 2022-09-04 DIAGNOSIS — F1024 Alcohol dependence with alcohol-induced mood disorder: Secondary | ICD-10-CM | POA: Diagnosis not present

## 2022-09-04 DIAGNOSIS — R0789 Other chest pain: Secondary | ICD-10-CM | POA: Diagnosis not present

## 2022-09-04 DIAGNOSIS — K7030 Alcoholic cirrhosis of liver without ascites: Secondary | ICD-10-CM | POA: Diagnosis not present

## 2022-09-04 DIAGNOSIS — Z6828 Body mass index (BMI) 28.0-28.9, adult: Secondary | ICD-10-CM | POA: Diagnosis not present

## 2022-09-25 DIAGNOSIS — R0602 Shortness of breath: Secondary | ICD-10-CM | POA: Diagnosis not present

## 2022-09-25 DIAGNOSIS — M545 Low back pain, unspecified: Secondary | ICD-10-CM | POA: Diagnosis not present

## 2022-09-25 DIAGNOSIS — R1011 Right upper quadrant pain: Secondary | ICD-10-CM | POA: Diagnosis not present

## 2022-09-25 DIAGNOSIS — M940 Chondrocostal junction syndrome [Tietze]: Secondary | ICD-10-CM | POA: Diagnosis not present

## 2022-09-25 DIAGNOSIS — K7030 Alcoholic cirrhosis of liver without ascites: Secondary | ICD-10-CM | POA: Diagnosis not present

## 2022-10-02 ENCOUNTER — Other Ambulatory Visit (INDEPENDENT_AMBULATORY_CARE_PROVIDER_SITE_OTHER): Payer: Self-pay | Admitting: Nurse Practitioner

## 2022-10-02 DIAGNOSIS — I83812 Varicose veins of left lower extremities with pain: Secondary | ICD-10-CM

## 2022-10-02 NOTE — Progress Notes (Deleted)
MRN : [id number]  Ivan Hamilton is a 57 y.o. (1965-10-07) male who presents with chief complaint of varicose veins hurt.  History of Present Illness:   The patient is seen for evaluation of symptomatic varicose veins. The patient relates burning and stinging which worsened steadily throughout the course of the day, particularly with standing. The patient also notes an aching and throbbing pain over the varicosities, particularly with prolonged dependent positions. The symptoms are significantly improved with elevation.  The patient also notes that during hot weather the symptoms are greatly intensified. The patient states the pain from the varicose veins interferes with work, daily exercise, shopping and household maintenance. At this point, the symptoms are persistent and severe enough that they're having a negative impact on lifestyle and are interfering with daily activities.  There is no history of DVT, PE or superficial thrombophlebitis. There is no history of ulceration or hemorrhage. The patient denies a significant family history of varicose veins.  The patient has not worn graduated compression in the past. At the present time the patient has not been using over-the-counter analgesics. There is no history of prior surgical intervention or sclerotherapy.   No outpatient medications have been marked as taking for the 10/04/22 encounter (Appointment) with Delana Meyer, Dolores Lory, MD.    Past Medical History:  Diagnosis Date   Alcohol withdrawal ([place])    B12 deficiency    Chronic pain    CVA (cerebral vascular accident) ([place])    Hypertension    Hypothyroidism    Varicose veins     Past Surgical History:  Procedure Laterality Date   CHOLECYSTECTOMY     COLONOSCOPY WITH PROPOFOL N/A 03/22/2021   Procedure: COLONOSCOPY WITH PROPOFOL;  Surgeon: Toledo, Benay Pike, MD;  Location: [facility] ENDOSCOPY;  Service: Gastroenterology;  Laterality: N/A;  SPANISH INTERPRETER    ESOPHAGOGASTRODUODENOSCOPY N/A 03/22/2021   Procedure: ESOPHAGOGASTRODUODENOSCOPY (EGD);  Surgeon: Toledo, Benay Pike, MD;  Location: [facility] ENDOSCOPY;  Service: Gastroenterology;  Laterality: N/A;   ESOPHAGOGASTRODUODENOSCOPY (EGD) WITH PROPOFOL N/A 10/24/2017   Procedure: ESOPHAGOGASTRODUODENOSCOPY (EGD) WITH PROPOFOL;  Surgeon: [name], MD;  Location: [facility] ENDOSCOPY;  Service: Endoscopy;  Laterality: N/A;   ESOPHAGOGASTRODUODENOSCOPY (EGD) WITH PROPOFOL N/A 07/12/2019   Procedure: ESOPHAGOGASTRODUODENOSCOPY (EGD) WITH PROPOFOL;  Surgeon: Jonathon Bellows, MD;  Location: [facility] ENDOSCOPY;  Service: Gastroenterology;  Laterality: N/A;   LEG SURGERY      Social History Social History   Tobacco Use   Smoking status: Never   Smokeless tobacco: Never  Vaping Use   Vaping Use: Never used  Substance Use Topics   Alcohol use: Yes    Alcohol/week: 21.0 standard drinks of alcohol    Types: 21 Shots of liquor per week    Comment: daily- 1 pint of vodka   Drug use: No    Family History No family history on file.  No Known Allergies   REVIEW OF SYSTEMS (Negative unless checked)  Constitutional: '[]'$ Weight loss  '[]'$ Fever  '[]'$ Chills Cardiac: '[]'$ Chest pain   '[]'$ Chest pressure   '[]'$ Palpitations   '[]'$ Shortness of breath when laying flat   '[]'$ Shortness of breath with exertion. Vascular:  '[]'$ Pain in legs with walking   '[x]'$ Pain in legs with standing  '[]'$ History of DVT   '[]'$ Phlebitis   '[]'$ Swelling in legs   '[x]'$ Varicose veins   '[]'$ Non-healing ulcers Pulmonary:   '[]'$ Uses home oxygen   '[]'$ Productive cough   '[]'$ Hemoptysis   '[]'$ Wheeze  '[]'$ COPD   '[]'$ Asthma Neurologic:  '[x]'$ Dizziness   '[]'$ Seizures   '[x]'$ History of  stroke   '[]'$ History of TIA  '[]'$ Aphasia   '[]'$ Vissual changes   '[]'$ Weakness or numbness in arm   '[]'$ Weakness or numbness in leg Musculoskeletal:   '[]'$ Joint swelling   '[]'$ Joint pain   '[]'$ Low back pain Hematologic:  '[]'$ Easy bruising  '[]'$ Easy bleeding   '[]'$ Hypercoagulable state   '[]'$ Anemic Gastrointestinal:  '[]'$ Diarrhea   '[]'$ Vomiting   '[x]'$ Gastroesophageal reflux/heartburn   '[]'$ Difficulty swallowing. Genitourinary:  '[]'$ Chronic kidney disease   '[]'$ Difficult urination  '[]'$ Frequent urination   '[]'$ Blood in urine Skin:  '[]'$ Rashes   '[]'$ Ulcers  Psychological:  '[x]'$ History of anxiety   '[]'$  History of major depression.  Physical Examination  There were no vitals filed for this visit. There is no height or weight on file to calculate BMI. Gen: WD/WN, NAD Head: [place]/AT, No temporalis wasting.  Ear/Nose/Throat: Hearing grossly intact, nares w/o erythema or drainage, pinna without lesions Eyes: PER, EOMI, sclera nonicteric.  Neck: Supple, no gross masses.  No JVD.  Pulmonary:  Good air movement, no audible wheezing, no use of accessory muscles.  Cardiac: RRR, precordium not hyperdynamic. Vascular:  Large varicosities present, greater than 10 mm ***.  Veins are tender to palpation  Mild venous stasis changes to the legs bilaterally.  Trace soft pitting edema CEAP [id number] Vessel Right Left  Radial Palpable Palpable  Gastrointestinal: soft, non-distended. No guarding/no peritoneal signs.  Musculoskeletal: M/S 5/5 throughout.  No deformity.  Neurologic: CN 2-12 intact. Pain and light touch intact in extremities.  Symmetrical.  Speech is fluent. Motor exam as listed above. Psychiatric: Judgment intact, Mood & affect appropriate for pt's clinical situation. Dermatologic: Venous rashes no ulcers noted.  No changes consistent with cellulitis. Lymph : No lichenification or skin changes of chronic lymphedema.  CBC Lab Results  Component Value Date   WBC 8.4 02/26/2022   HGB 16.1 02/26/2022   HCT 48.4 02/26/2022   MCV 91.5 02/26/2022   PLT 267 02/26/2022    BMET    Component Value Date/Time   NA 140 02/26/2022 2201   NA 141 08/17/2014 0527   K 4.0 02/26/2022 2201   K 3.6 08/17/2014 0527   CL 110 02/26/2022 2201   CL 106 08/17/2014 0527   CO2 20 (L) 02/26/2022 2201   CO2 26 08/17/2014 0527   GLUCOSE 108 (H) 02/26/2022 2201   GLUCOSE  89 08/17/2014 0527   BUN 8 02/26/2022 2201   BUN 3 (L) 08/17/2014 0527   CREATININE 0.90 02/26/2022 2201   CREATININE 0.69 08/17/2014 0527   CALCIUM 8.9 02/26/2022 2201   CALCIUM 9.0 08/17/2014 0527   GFRNONAA >60 02/26/2022 2201   GFRNONAA >60 08/17/2014 0527   GFRAA >60 01/14/2020 0444   GFRAA >60 08/17/2014 0527   CrCl cannot be calculated (Patient's most recent lab result is older than the maximum 21 days allowed.).  COAG Lab Results  Component Value Date   INR 1.1 05/17/2020   INR 1.3 (H) 01/10/2020   INR 1.2 01/09/2020    Radiology No results found.   Assessment/Plan There are no diagnoses linked to this encounter.   Hortencia Pilar, MD  10/02/2022 1:25 PM

## 2022-10-04 ENCOUNTER — Encounter (INDEPENDENT_AMBULATORY_CARE_PROVIDER_SITE_OTHER): Payer: Medicare Other | Admitting: Vascular Surgery

## 2022-10-04 ENCOUNTER — Encounter (INDEPENDENT_AMBULATORY_CARE_PROVIDER_SITE_OTHER): Payer: Medicare Other

## 2022-10-04 DIAGNOSIS — I8310 Varicose veins of unspecified lower extremity with inflammation: Secondary | ICD-10-CM

## 2022-10-04 DIAGNOSIS — I10 Essential (primary) hypertension: Secondary | ICD-10-CM

## 2022-10-04 DIAGNOSIS — M5137 Other intervertebral disc degeneration, lumbosacral region: Secondary | ICD-10-CM

## 2022-11-01 ENCOUNTER — Ambulatory Visit (INDEPENDENT_AMBULATORY_CARE_PROVIDER_SITE_OTHER): Payer: No Typology Code available for payment source

## 2022-11-01 ENCOUNTER — Encounter (INDEPENDENT_AMBULATORY_CARE_PROVIDER_SITE_OTHER): Payer: Self-pay | Admitting: Vascular Surgery

## 2022-11-01 ENCOUNTER — Ambulatory Visit (INDEPENDENT_AMBULATORY_CARE_PROVIDER_SITE_OTHER): Payer: No Typology Code available for payment source | Admitting: Vascular Surgery

## 2022-11-01 VITALS — BP 97/61 | HR 86 | Resp 18 | Ht 62.5 in | Wt 160.6 lb

## 2022-11-01 DIAGNOSIS — I8310 Varicose veins of unspecified lower extremity with inflammation: Secondary | ICD-10-CM

## 2022-11-01 DIAGNOSIS — I83812 Varicose veins of left lower extremities with pain: Secondary | ICD-10-CM | POA: Diagnosis not present

## 2022-11-01 NOTE — Progress Notes (Signed)
MRN : [id number]  Ivan Hamilton is a 57 y.o. (August 02, 1965) male who presents with chief complaint of varicose veins hurt.  History of Present Illness:   The patient is seen for re-evaluation of symptomatic varicose veins of the left lower extremity.  The patient has been seen in this practice before and since that time he has been continuing to use his graduated compression on a daily basis.  He practices elevation.  He has also been exercising including walking.  The patient relates burning and stinging which worsened steadily throughout the course of the day, particularly with standing. The patient also notes an aching and throbbing pain over the varicosities, particularly with prolonged dependent positions. The symptoms are significantly improved with elevation.  The patient also notes that during hot weather the symptoms are greatly intensified. The patient states the pain from the varicose veins interferes with work, daily exercise, shopping and household maintenance. At this point, the symptoms are persistent and severe enough that they're having a negative impact on lifestyle and are interfering with daily activities.  There is no history of DVT, PE or superficial thrombophlebitis. There is no history of ulceration or hemorrhage. The patient denies a significant family history of varicose veins.  There is no history of prior surgical intervention or sclerotherapy.   No outpatient medications have been marked as taking for the 11/01/22 encounter (Appointment) with Gilda Crease, Latina Craver, MD.    Past Medical History:  Diagnosis Date   Alcohol withdrawal (HCC)    B12 deficiency    Chronic pain    CVA (cerebral vascular accident) (HCC)    Hypertension    Hypothyroidism    Varicose veins     Past Surgical History:  Procedure Laterality Date   CHOLECYSTECTOMY     COLONOSCOPY WITH PROPOFOL N/A 03/22/2021   Procedure: COLONOSCOPY WITH PROPOFOL;  Surgeon: Toledo, Boykin Nearing, MD;   Location: [facility] ENDOSCOPY;  Service: Gastroenterology;  Laterality: N/A;  SPANISH INTERPRETER   ESOPHAGOGASTRODUODENOSCOPY N/A 03/22/2021   Procedure: ESOPHAGOGASTRODUODENOSCOPY (EGD);  Surgeon: Toledo, Boykin Nearing, MD;  Location: [facility] ENDOSCOPY;  Service: Gastroenterology;  Laterality: N/A;   ESOPHAGOGASTRODUODENOSCOPY (EGD) WITH PROPOFOL N/A 10/24/2017   Procedure: ESOPHAGOGASTRODUODENOSCOPY (EGD) WITH PROPOFOL;  Surgeon: Midge Minium, MD;  Location: [facility] ENDOSCOPY;  Service: Endoscopy;  Laterality: N/A;   ESOPHAGOGASTRODUODENOSCOPY (EGD) WITH PROPOFOL N/A 07/12/2019   Procedure: ESOPHAGOGASTRODUODENOSCOPY (EGD) WITH PROPOFOL;  Surgeon: Wyline Mood, MD;  Location: [facility] ENDOSCOPY;  Service: Gastroenterology;  Laterality: N/A;   LEG SURGERY      Social History Social History   Tobacco Use   Smoking status: Never   Smokeless tobacco: Never  Vaping Use   Vaping Use: Never used  Substance Use Topics   Alcohol use: Yes    Alcohol/week: 21.0 standard drinks of alcohol    Types: 21 Shots of liquor per week    Comment: daily- 1 pint of vodka   Drug use: No    Family History No family history on file.  No Known Allergies   REVIEW OF SYSTEMS (Negative unless checked)  Constitutional: [] Weight loss  [] Fever  [] Chills Cardiac: [] Chest pain   [] Chest pressure   [] Palpitations   [] Shortness of breath when laying flat   [] Shortness of breath with exertion. Vascular:  [] Pain in legs with walking   [x] Pain in legs with standing  [] History of DVT   [] Phlebitis   [] Swelling in legs   [x] Varicose veins   [] Non-healing ulcers Pulmonary:   [] Uses home oxygen   []   Productive cough   [] Hemoptysis   [] Wheeze  [] COPD   [] Asthma Neurologic:  [] Dizziness   [] Seizures   [] History of stroke   [] History of TIA  [] Aphasia   [] Vissual changes   [] Weakness or numbness in arm   [] Weakness or numbness in leg Musculoskeletal:   [] Joint swelling   [] Joint pain   [] Low back pain Hematologic:  [] Easy bruising  [] Easy  bleeding   [] Hypercoagulable state   [] Anemic Gastrointestinal:  [] Diarrhea   [] Vomiting  [] Gastroesophageal reflux/heartburn   [] Difficulty swallowing. Genitourinary:  [] Chronic kidney disease   [] Difficult urination  [] Frequent urination   [] Blood in urine Skin:  [] Rashes   [] Ulcers  Psychological:  [] History of anxiety   []  History of major depression.  Physical Examination  There were no vitals filed for this visit. There is no height or weight on file to calculate BMI. Gen: WD/WN, NAD Head: [place]/AT, No temporalis wasting.  Ear/Nose/Throat: Hearing grossly intact, nares w/o erythema or drainage, pinna without lesions Eyes: PER, EOMI, sclera nonicteric.  Neck: Supple, no gross masses.  No JVD.  Pulmonary:  Good air movement, no audible wheezing, no use of accessory muscles.  Cardiac: RRR, precordium not hyperdynamic. Vascular:  Large varicosities present, greater than 10 mm left.  Veins are tender to palpation  Mild venous stasis changes to the legs bilaterally.  Trace soft pitting edema CEAP C3sEpAsPr Vessel Right Left  Radial Palpable Palpable  Gastrointestinal: soft, non-distended. No guarding/no peritoneal signs.  Musculoskeletal: M/S 5/5 throughout.  No deformity.  Neurologic: CN 2-12 intact. Pain and light touch intact in extremities.  Symmetrical.  Speech is fluent. Motor exam as listed above. Psychiatric: Judgment intact, Mood & affect appropriate for pt's clinical situation. Dermatologic: Venous rashes no ulcers noted.  No changes consistent with cellulitis. Lymph : No lichenification or skin changes of chronic lymphedema.  CBC Lab Results  Component Value Date   WBC 8.4 02/26/2022   HGB 16.1 02/26/2022   HCT 48.4 02/26/2022   MCV 91.5 02/26/2022   PLT 267 02/26/2022    BMET    Component Value Date/Time   NA 140 02/26/2022 2201   NA 141 08/17/2014 0527   K 4.0 02/26/2022 2201   K 3.6 08/17/2014 0527   CL 110 02/26/2022 2201   CL 106 08/17/2014 0527   CO2 20 (L)  02/26/2022 2201   CO2 26 08/17/2014 0527   GLUCOSE 108 (H) 02/26/2022 2201   GLUCOSE 89 08/17/2014 0527   BUN 8 02/26/2022 2201   BUN 3 (L) 08/17/2014 0527   CREATININE 0.90 02/26/2022 2201   CREATININE 0.69 08/17/2014 0527   CALCIUM 8.9 02/26/2022 2201   CALCIUM 9.0 08/17/2014 0527   GFRNONAA >60 02/26/2022 2201   GFRNONAA >60 08/17/2014 0527   GFRAA >60 01/14/2020 0444   GFRAA >60 08/17/2014 0527   CrCl cannot be calculated (Patient's most recent lab result is older than the maximum 21 days allowed.).  COAG Lab Results  Component Value Date   INR 1.1 05/17/2020   INR 1.3 (H) 01/10/2020   INR 1.2 01/09/2020    Radiology No results found.   Assessment/Plan 1. Varicose veins with inflammation Recommend  I have reviewed my previous  discussion with the patient regarding  varicose veins and why they cause symptoms. Patient will continue  wearing graduated compression stockings class 1 on a daily basis, beginning first thing in the morning and removing them in the evening.  The patient is CEAP C3sEpAsPr.  The patient has been wearing  compression for more than 12 weeks with no or little benefit.  The patient has been exercising daily for more than 12 weeks. The patient has been elevating and taking OTC pain medications for more than 12 weeks.  None of these have have eliminated the pain related to the varicose veins and venous reflux or the discomfort regarding venous congestion.    In addition, behavioral modification including elevation during the day was again discussed and this will continue.  The patient has utilized over the counter pain medications and has been exercising.  However, at this time conservative therapy has not alleviated the patient's symptoms of leg pain and swelling  Recommend: laser ablation of the left great saphenous veins to eliminate the symptoms of pain and swelling of the lower extremities caused by the severe superficial venous reflux disease.      Levora DredgeGregory Frayda Egley, MD  11/01/2022 1:06 PM

## 2022-11-04 ENCOUNTER — Encounter (INDEPENDENT_AMBULATORY_CARE_PROVIDER_SITE_OTHER): Payer: Self-pay | Admitting: Vascular Surgery

## 2022-11-08 ENCOUNTER — Other Ambulatory Visit: Payer: Self-pay

## 2022-11-08 ENCOUNTER — Emergency Department
Admission: EM | Admit: 2022-11-08 | Discharge: 2022-11-08 | Disposition: A | Discharge status: Home / Self Care | Payer: Self-pay | Attending: Emergency Medicine | Admitting: Emergency Medicine

## 2022-11-08 DIAGNOSIS — F10920 Alcohol use, unspecified with intoxication, uncomplicated: Secondary | ICD-10-CM

## 2022-11-08 DIAGNOSIS — F10120 Alcohol abuse with intoxication, uncomplicated: Secondary | ICD-10-CM | POA: Insufficient documentation

## 2022-11-08 LAB — CBC
HCT: 44.2 % (ref 39.0–52.0)
Hemoglobin: 15.1 g/dL (ref 13.0–17.0)
MCH: 31.6 pg (ref 26.0–34.0)
MCHC: 34.2 g/dL (ref 30.0–36.0)
MCV: 92.5 fL (ref 80.0–100.0)
Platelets: 314 10*3/uL (ref 150–400)
RBC: 4.78 MIL/uL (ref 4.22–5.81)
RDW: 13.4 % (ref 11.5–15.5)
WBC: 7.7 10*3/uL (ref 4.0–10.5)
nRBC: 0 % (ref 0.0–0.2)

## 2022-11-08 NOTE — ED Triage Notes (Signed)
BIB by [facility] for medical clearance for jail. Pt reports ETOH on board after drinking all day. Pt alert and oriented following commands. Ambulatory in triage. Pt breathing unlabored speaking in full sentences with symmetric chest rise and fall.

## 2022-11-08 NOTE — ED Notes (Signed)
Pt brought in by [employer] for medical clearance. Pt reports etoh use.  Pt placed in hallway bed with 3 [employer] officers.  Md at bedside.

## 2022-11-08 NOTE — ED Notes (Signed)
Pt belongings:  [employer] print pants [employer] socks

## 2022-11-08 NOTE — ED Triage Notes (Signed)
Pt now repeatedly stating that he wants to kill himself/shoot himself with a gun.

## 2022-11-08 NOTE — Discharge Instructions (Addendum)
No indications that it would be unsafe for patient to sober up in jail.

## 2022-11-08 NOTE — ED Provider Notes (Signed)
   [facility] Provider Note    Event Date/Time   First MD Initiated Contact with Patient 11/08/22 2124     (approximate)   History   Medical clearance   HPI  Ivan Hamilton is a 57 y.o. male who presents to the emergency department today under police custody for clearance to go to jail.  Apparently he blew high enough on his blood alcohol that they were asked to bring him for medical clearance.  Patient himself appears intoxicated and cannot give good answers.      Physical Exam   Triage Vital Signs: ED Triage Vitals  Enc Vitals Group     BP 11/08/22 2112 (!) 144/102     Pulse Rate 11/08/22 2112 76     Resp 11/08/22 2112 20     Temp 11/08/22 2112 98.3 F (36.8 C)     Temp Source 11/08/22 2112 Oral     SpO2 11/08/22 2112 95 %     Weight 11/08/22 2103 170 lb (77.1 kg)     Height 11/08/22 2103  (1.6 m)     Head Circumference --      Peak Flow --      Pain Score --      Pain Loc --      Pain Edu? --      Excl. in GC? --     Most recent vital signs: Vitals:   11/08/22 2112  BP: (!) 144/102  Pulse: 76  Resp: 20  Temp: 98.3 F (36.8 C)  SpO2: 95%   General: Awake, alert, intoxicated. CV:  Good peripheral perfusion. Regular rate and rhythm. Resp:  Normal effort. Lungs clear. Abd:  No distention.  Other:  Appears intoxicated.    ED Results / Procedures / Treatments    EKG  None   RADIOLOGY None    PROCEDURES:  Critical Care performed: No    MEDICATIONS ORDERED IN ED: Medications - No data to display   IMPRESSION / MDM / ASSESSMENT AND PLAN / ED COURSE  I reviewed the triage vital signs and the nursing notes.                              Differential diagnosis includes, but is not limited to, alcohol intoxication, polysubstance use  Patient's presentation is most consistent with acute presentation with potential threat to life or bodily function.   Patient presented to the emergency department today for  medical clearance to go to jail.  Patient does appear intoxicated.  No evidence of trauma.  At this time feel it is reasonable for patient to proceed to jail for sobriety. Police officers stated they do have suicide watch.     FINAL CLINICAL IMPRESSION(S) / ED DIAGNOSES   Final diagnoses:  Alcoholic intoxication without complication      Note:  This document was prepared using Dragon voice recognition software and may include unintentional dictation errors.     Ivan Semen, MD 11/08/22 2134

## 2022-11-25 NOTE — Progress Notes (Unsigned)
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 this or any previous visit.  Lumbar CT w contrast: No results found for this or any previous visit.  Lumbar DG 1V: No results found for this or any previous visit.  Lumbar DG 1V (Clearing): No results found for this or any previous visit.  Lumbar DG 2-3V (Clearing): No results found for this or any previous visit.  Lumbar DG 2-3 views: No results found for this or any previous visit.  Lumbar DG (Complete) 4+V: No results found for this or any previous visit.        Lumbar DG F/E views: No results found for this or any previous visit.        Lumbar DG Bending views: No results found for this or any previous visit.        Lumbar DG Myelogram views: No results found for this or any previous  visit.  Lumbar DG Myelogram: No results found for this or any previous visit.  Lumbar DG Myelogram: No results found for this or any previous visit.  Lumbar DG Myelogram: No results found for this or any previous visit.  Lumbar DG Myelogram Lumbosacral: No results found for this or any previous visit.  Lumbar DG Diskogram views: No results found for this or any previous visit.  Lumbar  DG Diskogram views: No results found for this or any previous visit.  Lumbar DG Epidurogram OP: No results found for this or any previous visit.  Lumbar DG Epidurogram IP: No valid procedures specified.  Sacroiliac Joint Imaging: Sacroiliac Joint DG: No results found for this or any previous visit.  Sacroiliac Joint MR w/wo contrast: No results found for this or any previous visit.  Sacroiliac Joint MR wo contrast: No results found for this or any previous visit.   Spine Imaging: Whole Spine DG Myelogram views: No results found for this or any previous visit.  Whole Spine MR Mets screen: No results found for this or any previous visit.  Whole Spine MR Mets screen: No results found for this or any previous visit.  Whole Spine MR w/wo: No results found for this or any previous visit.  MRA Spinal Canal w/ cm: No results found for this or any previous visit.  MRA Spinal Canal wo/ cm: No valid procedures specified. MRA Spinal Canal w/wo cm: No results found for this or any previous visit.  Spine Outside MR Films: No results found for this or any previous visit.  Spine Outside CT Films: No results found for this or any previous visit.  CT-Guided Biopsy: No results found for this or any previous visit.  CT-Guided Needle Placement: No results found for this or any previous visit.  DG Spine outside: No results found for this or any previous visit.  IR Spine outside: No results found for this or any previous visit.  NM Spine outside: No results found for this or any previous visit.   Hip  Imaging: Hip-R MR w contrast: No results found for this or any previous visit.  Hip-L MR w contrast: No results found for this or any previous visit.  Hip-R MR w/wo contrast: No results found for this or any previous visit.  Hip-L MR w/wo contrast: No results found for this or any previous visit.  Hip-R MR wo contrast: No results found for this or any previous visit.  Hip-L MR wo contrast: No results found for this or any previous visit.  Hip-R CT w contrast: No results found for this or any previous visit.  Hip-L CT w contrast: No results found for this or any previous visit.  Hip-R CT w/wo contrast: No results found for this or any previous visit.  Hip-L CT w/wo contrast: No results found for this or any previous visit.  Hip-R CT wo contrast: No results found for this or any previous visit.  Hip-L CT wo contrast: No results found for this or any previous visit.  Hip-R DG 2-3 views: No results found for this or any previous visit.  Hip-L DG 2-3 views: No results found for this or any previous visit.  Hip-R DG Arthrogram: No results found for this or any previous visit.  Hip-L DG Arthrogram: No results found for this or any previous visit.  Hip-B DG Bilateral: No results found for this or any previous visit.   Knee Imaging: Knee-R MR w contrast: No results found for this or any previous visit.  Knee-L MR w/o contrast: No results found for this or any previous visit.  Knee-R MR w/wo contrast: No results found for this or any previous visit.  Knee-L MR w/wo contrast: No results found for this or any previous visit.  Knee-R MR wo contrast: No results found for this or any previous visit.  Knee-L MR wo contrast: No results found for this or any previous visit.  Knee-R CT w contrast: No results found for this or any previous visit.  Knee-L CT w contrast: No results found for this or any previous visit.  Knee-R CT w/wo contrast: No results found for this or any previous  visit.  Knee-L CT w/wo contrast: No results found for this or any previous visit.  Knee-R CT wo contrast: No results found for this or any previous visit.  Knee-L CT wo contrast: No results found for this or any previous visit.  Knee-R DG 1-2 views: No results found for this or any previous visit.  Knee-L DG 1-2 views: No results found for this or any previous visit.  Knee-R DG 3 views: No results found for this or any previous visit.  Knee-L DG 3 views: No results found for this or any previous visit.  Knee-R DG 4 views: No results found for this or any previous visit.  Knee-L DG 4 views: No results found for this or any previous visit.  Knee-R DG Arthrogram: No results found for this or any previous visit.  Knee-L DG Arthrogram: No results found for this or any previous visit.   Ankle Imaging: Ankle-R DG Complete: No results found for this or any previous visit.  Ankle-L DG Complete: No results found for this or any previous visit.   Foot Imaging: Foot-R DG Complete: No results found for this or any previous visit.  Foot-L DG Complete: No results found for this or any previous visit.   Elbow Imaging: Elbow-R DG Complete: No results found for this or any previous visit.  Elbow-L DG Complete: No results found for this or any previous visit.   Wrist Imaging: Wrist-R DG Complete: No results found for this or any previous visit.  Wrist-L DG Complete: No results found for this or any previous visit.   Hand Imaging: Hand-R DG Complete: No results found for this or any previous visit.  Hand-L DG Complete: No results found for this or any previous visit.   Complexity Note: Imaging results reviewed.                         ROS  Cardiovascular: {Hx; Cardiovascular History:210120525} Pulmonary or Respiratory: {Hx; Pumonary and/or Respiratory History:210120523} Neurological: {Hx; Neurological:210120504} Psychological-Psychiatric: {Hx; Psychological-Psychiatric  History:210120512} Gastrointestinal: {Hx; Gastrointestinal:210120527} Genitourinary: {Hx; Genitourinary:210120506} Hematological: {Hx; Hematological:210120510} Endocrine: {Hx; Endocrine history:210120509} Rheumatologic: {Hx; Rheumatological:210120530} Musculoskeletal: {Hx; Musculoskeletal:210120528} Work History: {Hx; Work history:210120514}  Allergies  [name] has No Known Allergies.  Laboratory Chemistry Profile   Renal Lab Results  Component Value Date   BUN 8 02/26/2022   CREATININE 0.90 02/26/2022   GFRAA >60 01/14/2020   GFRNONAA >60 02/26/2022   PROTEINUR NEGATIVE 10/17/2021     Electrolytes Lab Results  Component Value Date   NA 140 02/26/2022   K 4.0 02/26/2022   CL 110 02/26/2022   CALCIUM 8.9 02/26/2022   MG 1.4 (L) 01/10/2020   PHOS 2.8 01/10/2020     Hepatic Lab Results  Component Value Date   AST 79 (H) 02/26/2022   ALT 59 (H) 02/26/2022   ALBUMIN 4.8 02/26/2022   ALKPHOS 82 02/26/2022   AMYLASE 121 (H) 05/15/2020   LIPASE 31 03/04/2021   AMMONIA 48 (H) 05/17/2020     ID Lab Results  Component Value Date   HIV Non Reactive 05/17/2020   SARSCOV2NAA NEGATIVE 02/26/2022   MRSAPCR POSITIVE (A) 01/10/2020   HCVAB NON REACTIVE 05/17/2020     Bone No results found for: "VD25OH", "[id number]", "  RU0454UJ8", "JX9147WG9", "25OHVITD1", "25OHVITD2", "25OHVITD3", "TESTOFREE", "TESTOSTERONE"   Endocrine Lab Results  Component Value Date   GLUCOSE 108 (H) 02/26/2022   GLUCOSEU NEGATIVE 10/17/2021   HGBA1C 5.3 11/08/2020   TSH 0.843 07/16/2020   FREET4 0.88 07/16/2020   CRTSLPL 20.8 07/15/2019     Neuropathy Lab Results  Component Value Date   VITAMINB12 784 10/28/2017   FOLATE 40.0 10/28/2017   HGBA1C 5.3 11/08/2020   HIV Non Reactive 05/17/2020     CNS No results found for: "COLORCSF", "APPEARCSF", "RBCCOUNTCSF", "WBCCSF", "POLYSCSF", "LYMPHSCSF", "EOSCSF", "PROTEINCSF", "GLUCCSF", "JCVIRUS", "CSFOLI", "IGGCSF", "LABACHR", "ACETBL"    Inflammation (CRP: Acute  ESR: Chronic) Lab Results  Component Value Date   LATICACIDVEN 3.2 (HH) 10/17/2021     Rheumatology No results found for: "RF", "ANA", "LABURIC", "URICUR", "LYMEIGGIGMAB", "LYMEABIGMQN", "HLAB27"   Coagulation Lab Results  Component Value Date   INR 1.1 05/17/2020   LABPROT 13.9 05/17/2020   APTT 33 05/17/2020   PLT 314 11/08/2022     Cardiovascular Lab Results  Component Value Date   HGB 15.1 11/08/2022   HCT 44.2 11/08/2022     Screening Lab Results  Component Value Date   SARSCOV2NAA NEGATIVE 02/26/2022   MRSAPCR POSITIVE (A) 01/10/2020   HCVAB NON REACTIVE 05/17/2020   HIV Non Reactive 05/17/2020     Cancer No results found for: "CEA", "CA125", "LABCA2"   Allergens No results found for: "ALMOND", "APPLE", "ASPARAGUS", "AVOCADO", "BANANA", "BARLEY", "BASIL", "BAYLEAF", "GREENBEAN", "LIMABEAN", "WHITEBEAN", "BEEFIGE", "REDBEET", "BLUEBERRY", "BROCCOLI", "CABBAGE", "MELON", "CARROT", "CASEIN", "CASHEWNUT", "CAULIFLOWER", "CELERY"     Note: Lab results reviewed.  PFSH  Drug: [name]  reports no history of drug use. Alcohol:  reports current alcohol use of about 21.0 standard drinks of alcohol per week. Tobacco:  reports that he has never smoked. He has never used smokeless tobacco. Medical:  has a past medical history of Alcohol withdrawal (HCC), B12 deficiency, Chronic pain, CVA (cerebral vascular accident) (HCC), Hypertension, Hypothyroidism, and Varicose veins. Family: family history is not on file.  Past Surgical History:  Procedure Laterality Date   CHOLECYSTECTOMY     COLONOSCOPY WITH PROPOFOL N/A 03/22/2021   Procedure: COLONOSCOPY WITH PROPOFOL;  Surgeon: Toledo, Boykin Nearing, MD;  Location: [facility] ENDOSCOPY;  Service: Gastroenterology;  Laterality: N/A;  SPANISH INTERPRETER   ESOPHAGOGASTRODUODENOSCOPY N/A 03/22/2021   Procedure: ESOPHAGOGASTRODUODENOSCOPY (EGD);  Surgeon: Toledo, Boykin Nearing, MD;  Location: [facility] ENDOSCOPY;   Service: Gastroenterology;  Laterality: N/A;   ESOPHAGOGASTRODUODENOSCOPY (EGD) WITH PROPOFOL N/A 10/24/2017   Procedure: ESOPHAGOGASTRODUODENOSCOPY (EGD) WITH PROPOFOL;  Surgeon: Midge Minium, MD;  Location: [facility] ENDOSCOPY;  Service: Endoscopy;  Laterality: N/A;   ESOPHAGOGASTRODUODENOSCOPY (EGD) WITH PROPOFOL N/A 07/12/2019   Procedure: ESOPHAGOGASTRODUODENOSCOPY (EGD) WITH PROPOFOL;  Surgeon: Wyline Mood, MD;  Location: [facility] ENDOSCOPY;  Service: Gastroenterology;  Laterality: N/A;   LEG SURGERY     Active Ambulatory Problems    Diagnosis Date Noted   Chronic post-traumatic stress disorder (PTSD) 09/01/2015   Hypothyroid 09/01/2015   Gastric reflux 09/01/2015   DDD (degenerative disc disease), lumbosacral 09/01/2015   Alcohol withdrawal (HCC) 09/02/2015   Alcohol use disorder, severe, dependence (HCC) 09/02/2015   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/2015   Alcohol withdrawal delirium (HCC)    Hypokalemia 10/22/2017   Intractable vomiting with nausea    Duodenitis    Gastritis without bleeding    Abnormal LFTs (liver function tests) 03/31/2015   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   Confusion state 03/31/2015  Chronic pain of left lower extremity 03/31/2015   Neuropathy 05/17/2015   Pain of lumbar spine 05/19/2015   Anxiety and depression 04/10/2016   Varicose veins with inflammation 04/25/2015   Vitamin B12 deficiency 08/17/2015   Suicidal behavior 08/28/2018   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/28/2018   Alcohol intoxication (HCC) 08/28/2018   Syncope 07/11/2019   Abdominal pain 07/11/2019   Alcohol abuse 07/11/2019   Thrombocytopenia (HCC) 07/11/2019   Normocytic anemia 07/11/2019   Tobacco abuse 07/11/2019   CVA (cerebral vascular accident) (HCC)    HTN (hypertension) 07/11/2019   Diverticulitis    Transaminitis    Hepatic cirrhosis (HCC)     Metabolic acidosis    Hypotension 01/10/2020   Lip swelling    Hyperbilirubinemia 12/23/2018   Major psychotic depression, recurrent (HCC) 06/01/2019   S/P laparoscopic cholecystectomy 11/02/2017   RUQ abdominal pain 05/17/2020   Severe major depression without psychotic features (HCC) 11/07/2020   Generalized anxiety disorder 11/07/2020   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 In no apparent acute distress There were no vitals filed for this visit. BMI Assessment: Estimated body mass index is 30.11 kg/m as calculated from the following:   Height as of 11/08/22: 5\' 3"  (1.6 m).   Weight as of 11/08/22: 170 lb (77.1 kg).  BMI interpretation table: BMI level Category Range association with higher incidence of chronic pain  <18 kg/m2 Underweight   18.5-24.9 kg/m2 Ideal body weight   25-29.9 kg/m2 Overweight Increased incidence by 20%  30-34.9 kg/m2 Obese (Class I) Increased incidence by 68%  35-39.9 kg/m2 Severe obesity (Class II) Increased incidence by 136%  >40 kg/m2 Extreme obesity (Class III) Increased incidence by 254%   Patient's current BMI Ideal Body weight  There is no height or weight on file to calculate BMI. Patient weight not recorded   BMI Readings from Last 4 Encounters:  11/08/22 30.11 kg/m  11/01/22 28.91 kg/m  02/26/22 27.79 kg/m  10/17/21 28.79 kg/m   Wt Readings from Last 4 Encounters:  11/08/22 170 lb (77.1 kg)  11/01/22 160 lb 9.6 oz (72.8 kg)  02/26/22 167 lb (75.8 kg)  10/17/21 173 lb (78.5 kg)    Psych/Mental status: Alert, oriented x 3 (person, place, & time)       Eyes:  PERLA Respiratory: No evidence of acute respiratory distress  Assessment  Primary Diagnosis & Pertinent Problem List: There were no encounter diagnoses.  Visit Diagnosis (New problems to examiner): No diagnosis found. Plan of Care (Initial workup plan)  Note: [name] was reminded that as per protocol, today's visit has been an evaluation only. We have not taken over the patient's controlled substance management.  Problem-specific plan: No problem-specific Assessment & Plan notes found for this encounter.  Lab Orders  No laboratory test(s) ordered today   Imaging Orders  No imaging studies ordered today   Referral Orders  No referral(s) requested today   Procedure Orders    No procedure(s) ordered today   Pharmacotherapy (current): Medications ordered:  No orders of the defined types were placed in this encounter.  Medications administered during this visit: Lou Ivan Hamilton had no medications administered during this visit.   Analgesic Pharmacotherapy:  Opioid Analgesics: For patients currently taking or requesting to take opioid analgesics, in accordance with [facility] Guidelines, we will assess their risks and indications for the use of these substances. After completing our evaluation, we may offer recommendations, but we no longer take patients for medication management. The prescribing physician will ultimately decide, based on his/her training and level of comfort whether to adopt any of the recommendations, including whether or not to prescribe such medicines.  Membrane stabilizer: To be determined at a later time  Muscle relaxant: To be determined at a later time  NSAID: To be determined at a later time  Other analgesic(s): To be determined at a later time   Interventional management options: [name] was informed that there is no guarantee that he would be a candidate for interventional therapies. The decision will be based on the  results of diagnostic studies, as well as [name]'s risk profile.  Procedure(s) under consideration:  Pending results of ordered studies      Interventional Therapies  Risk Factors  Considerations:     Planned  Pending:      Under consideration:   Pending   Completed:   None at this time   Therapeutic  Palliative (PRN) options:   None established   Completed by other providers:   None reported       Provider-requested follow-up: No follow-ups on file.  Future Appointments  Date Time Provider Department Center  11/26/2022  2:00 PM Delano Metz, MD [facility] None    Duration of encounter: *** minutes.  Total time on encounter, as per AMA guidelines included both the face-to-face and non-face-to-face time personally spent by the physician and/or other qualified health care professional(s) on the day of the encounter (includes time in activities that require the physician or other qualified health care professional and does not include time in activities normally performed by clinical staff). Physician's time may include the following activities when performed: Preparing to see the patient (e.g., pre-charting review of records, searching for previously ordered imaging, lab work, and nerve conduction tests) Review of prior analgesic pharmacotherapies. Reviewing PMP Interpreting ordered tests (e.g., lab work, imaging, nerve conduction tests) Performing post-procedure evaluations, including interpretation of diagnostic procedures Obtaining and/or reviewing separately obtained history Performing a medically appropriate examination and/or evaluation Counseling and educating the patient/family/caregiver Ordering medications, tests, or procedures Referring and communicating with other health care professionals (when not separately reported) Documenting clinical information in the electronic or other health record Independently interpreting results (not separately  reported) and communicating results to the patient/ family/caregiver Care coordination (not separately reported)  Note by: Oswaldo Done, MD (TTS technology used. I apologize for any typographical errors that were not detected and corrected.) Date: 11/26/2022; Time: 4:01 PM

## 2022-11-26 ENCOUNTER — Ambulatory Visit (HOSPITAL_BASED_OUTPATIENT_CLINIC_OR_DEPARTMENT_OTHER): Payer: Medicaid Other | Admitting: Pain Medicine

## 2022-11-26 DIAGNOSIS — Z789 Other specified health status: Secondary | ICD-10-CM | POA: Insufficient documentation

## 2022-11-26 DIAGNOSIS — Z79899 Other long term (current) drug therapy: Secondary | ICD-10-CM | POA: Insufficient documentation

## 2022-11-26 DIAGNOSIS — G894 Chronic pain syndrome: Secondary | ICD-10-CM | POA: Insufficient documentation

## 2022-11-26 DIAGNOSIS — Z91199 Patient's noncompliance with other medical treatment and regimen due to unspecified reason: Secondary | ICD-10-CM

## 2022-11-26 DIAGNOSIS — M899 Disorder of bone, unspecified: Secondary | ICD-10-CM | POA: Insufficient documentation

## 2022-11-26 NOTE — Patient Instructions (Signed)
____________________________________________________________________________________________  New Patients  Welcome to [facility] [place] [facility].   Initial Visit The first or initial visit consists of an evaluation only.   Interventional pain management.  We offer therapies other than opioid controlled substances to manage chronic pain. These include, but are not limited to, diagnostic, therapeutic, and palliative specialized injection therapies (i.e.: Epidural Steroids, Facet Blocks, etc.). We specialize in a variety of nerve blocks as well as radiofrequency treatments. We offer pain implant evaluations and trials, as well as follow up management. In addition we also provide a variety joint injections, including Viscosupplementation (AKA: Gel Therapy).  Prescription Pain Medication. We specialize in alternatives to opioids. We can provide evaluations and recommendations for/of pharmacologic therapies based on CDC Guidelines.  We no longer take patients for long-term medication management. We will not be taking over your pain medications.  ____________________________________________________________________________________________    ____________________________________________________________________________________________  Patient Information update  To: All of our patients.  Re: Name change.  It has been made official that our current name, "[facility]"   will soon be changed to "[facility] [place] [facility]".   The purpose of this change is to eliminate any confusion created by the concept of our practice being a "Medication Management Pain Clinic". In the past this has led to the misconception that we treat pain primarily by the use of prescription medications.  Nothing can be farther from the truth.   Understanding PAIN MANAGEMENT: To  further understand what our practice does, you first have to understand that "Pain Management" is a subspecialty that requires additional training once a physician has completed their specialty training, which can be in either Anesthesia, Neurology, Psychiatry, or Physical Medicine and Rehabilitation (PMR). Each one of these contributes to the final approach taken by each physician to the management of their patient's pain. To be a "Pain Management Specialist" you must have first completed one of the specialty trainings below.  Anesthesiologists - trained in clinical pharmacology and interventional techniques such as nerve blockade and regional as well as central neuroanatomy. They are trained to block pain before, during, and after surgical interventions.  Neurologists - trained in the diagnosis and pharmacological treatment of complex neurological conditions, such as Multiple Sclerosis, Parkinson's, spinal cord injuries, and other systemic conditions that may be associated with symptoms that may include but are not limited to pain. They tend to rely primarily on the treatment of chronic pain using prescription medications.  Psychiatrist - trained in conditions affecting the psychosocial wellbeing of patients including but not limited to depression, anxiety, schizophrenia, personality disorders, addiction, and other substance use disorders that may be associated with chronic pain. They tend to rely primarily on the treatment of chronic pain using prescription medications.   Physical Medicine and Rehabilitation (PMR) physicians, also known as physiatrists - trained to treat a wide variety of medical conditions affecting the brain, spinal cord, nerves, bones, joints, ligaments, muscles, and tendons. Their training is primarily aimed at treating patients that have suffered injuries that have caused severe physical impairment. Their training is primarily aimed at the physical therapy and rehabilitation of those  patients. They may also work alongside orthopedic surgeons or neurosurgeons using their expertise in assisting surgical patients to recover after their surgeries.  INTERVENTIONAL PAIN MANAGEMENT is sub-subspecialty of Pain Management.  Our physicians are Board-certified in Anesthesia, Pain Management, and Interventional Pain Management.  This meaning that not only have they been trained   and Board-certified in their specialty of Anesthesia, and subspecialty of Pain Management, but they have also received further training in the sub-subspecialty of Interventional Pain Management, in order to become Board-certified as INTERVENTIONAL PAIN MANAGEMENT SPECIALIST.    Mission: Our goal is to use our skills in  INTERVENTIONAL PAIN MANAGEMENT as alternatives to the chronic use of prescription opioid medications for the treatment of pain. To make this more clear, we have changed our name to reflect what we do and offer. We will continue to offer medication management assessment and recommendations, but we will not be taking over any patient's medication management.  ____________________________________________________________________________________________     

## 2022-11-27 NOTE — Progress Notes (Unsigned)
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 contrast: No results found for this or any previous visit.  Lumbar CT w/wo contrast: No results found for this or any previous visit.  Lumbar CT w contrast: No results found for this or any previous visit.  Lumbar DG 1V: No results found for this or any previous visit.  Lumbar DG 1V (Clearing): No results found for this or any previous visit.  Lumbar DG 2-3V (Clearing): No results found for this or any previous visit.  Lumbar DG 2-3 views: No results found for this or any previous visit.  Lumbar DG (Complete) 4+V: No results found for this or any previous visit.        Lumbar DG F/E views: No results found for this or any previous visit.        Lumbar DG  Bending views: No results found for this or any previous visit.        Lumbar DG Myelogram views: No results found for this or any previous visit.  Lumbar DG Myelogram: No results found for this or any previous visit.  Lumbar DG Myelogram: No results found for this or any previous visit.  Lumbar DG Myelogram: No results found for this or any previous visit.  Lumbar DG Myelogram Lumbosacral: No results found for this or any previous visit.  Lumbar DG  Diskogram views: No results found for this or any previous visit.  Lumbar DG Diskogram views: No results found for this or any previous visit.  Lumbar DG Epidurogram OP: No results found for this or any previous visit.  Lumbar DG Epidurogram IP: No valid procedures specified.  Sacroiliac Joint Imaging: Sacroiliac Joint DG: No results found for this or any previous visit.  Sacroiliac Joint MR w/wo contrast: No results found for this or any previous visit.  Sacroiliac Joint MR wo contrast: No results found for this or any previous visit.   Spine Imaging: Whole Spine DG Myelogram views: No results found for this or any previous visit.  Whole Spine MR Mets screen: No results found for this or any previous visit.  Whole Spine MR Mets screen: No results found for this or any previous visit.  Whole Spine MR w/wo: No results found for this or any previous visit.  MRA Spinal Canal w/ cm: No results found for this or any previous visit.  MRA Spinal Canal wo/ cm: No valid procedures specified. MRA Spinal Canal w/wo cm: No results found for this or any previous visit.  Spine Outside MR Films: No results found for this or any previous visit.  Spine Outside CT Films: No results found for this or any previous visit.  CT-Guided Biopsy: No results found for this or any previous visit.  CT-Guided Needle Placement: No results found for this or any previous visit.  DG Spine outside: No results found for this or any previous visit.  IR  Spine outside: No results found for this or any previous visit.  NM Spine outside: No results found for this or any previous visit.   Hip Imaging: Hip-R MR w contrast: No results found for this or any previous visit.  Hip-L MR w contrast: No results found for this or any previous visit.  Hip-R MR w/wo contrast: No results found for this or any previous visit.  Hip-L MR w/wo contrast: No results found for this or any previous visit.  Hip-R MR wo contrast: No results found for this or any previous visit.  Hip-L MR wo contrast: No results found for this or any previous visit.  Hip-R CT w contrast: No results found for this or any previous visit.  Hip-L CT w contrast: No results found for this or any previous visit.  Hip-R CT w/wo contrast: No results found for this or any previous visit.  Hip-L CT w/wo contrast: No results found for this or any previous visit.  Hip-R CT wo contrast: No results found for this or any previous visit.  Hip-L CT wo contrast: No results found for this or any previous visit.  Hip-R DG 2-3 views: No results found for this or any previous visit.  Hip-L DG 2-3 views: No results found for this or any previous visit.  Hip-R DG Arthrogram: No results found for this or any previous visit.  Hip-L DG Arthrogram: No results found for this or any previous visit.  Hip-B DG Bilateral: No results found for this or any previous visit.   Knee Imaging: Knee-R MR w contrast: No results found for this or any previous visit.  Knee-L MR w/o contrast: No results found for this or any previous visit.  Knee-R MR w/wo contrast: No results found for this or any previous visit.  Knee-L MR w/wo contrast: No results found for this or any previous visit.  Knee-R MR wo contrast: No results found for this or any previous visit.  Knee-L MR wo contrast: No results found for this or any previous visit.  Knee-R CT w contrast: No results found for this or any previous  visit.  Knee-L CT w contrast: No results found for this or any previous visit.  Knee-R CT w/wo contrast: No results found for this or any previous visit.  Knee-L CT w/wo contrast: No results found for this or any previous visit.  Knee-R CT wo contrast: No results found for this or any previous visit.  Knee-L CT wo contrast: No results found for this or any previous visit.  Knee-R DG 1-2 views: No results found for this or any previous visit.  Knee-L DG 1-2 views: No results found for this or any previous visit.  Knee-R DG 3 views: No results found for this or any previous visit.  Knee-L DG 3 views: No results found for this or any previous visit.  Knee-R DG 4 views: No results found for this or any previous visit.  Knee-L DG 4 views: No results found for this or any previous visit.  Knee-R DG Arthrogram: No results found for this or any previous visit.  Knee-L DG Arthrogram: No results found for this or any previous visit.   Ankle Imaging: Ankle-R DG Complete: No results found for this or any previous visit.  Ankle-L DG Complete: No results found for this or any previous visit.   Foot Imaging: Foot-R DG Complete: No results found for this or any previous visit.  Foot-L DG Complete: No results found for this or any previous visit.   Elbow Imaging: Elbow-R DG Complete: No results found for this or any previous visit.  Elbow-L DG Complete: No results found for this or any previous visit.   Wrist Imaging: Wrist-R DG Complete: No results found for this or any previous visit.  Wrist-L DG Complete: No results found for this or any previous visit.   Hand Imaging: Hand-R DG Complete: No results found for this or any previous visit.  Hand-L DG Complete: No results found for this or any previous visit.   Complexity Note: Imaging results reviewed.                         ROS  Cardiovascular: {Hx; Cardiovascular History:210120525} Pulmonary or Respiratory: {Hx;  Pumonary and/or Respiratory History:210120523} Neurological: {Hx; Neurological:210120504} Psychological-Psychiatric: {Hx; Psychological-Psychiatric History:210120512} Gastrointestinal: {Hx; Gastrointestinal:210120527} Genitourinary: {Hx; Genitourinary:210120506} Hematological: {Hx; Hematological:210120510} Endocrine: {Hx; Endocrine history:210120509} Rheumatologic: {Hx; Rheumatological:210120530} Musculoskeletal: {Hx; Musculoskeletal:210120528} Work History: {Hx; Work history:210120514}  Allergies  Ivan Hamilton has No Known Allergies.  Laboratory Chemistry Profile   Renal Lab Results  Component Value Date   BUN 8 02/26/2022   CREATININE 0.90 02/26/2022   GFRAA >60 01/14/2020   GFRNONAA >60 02/26/2022   PROTEINUR NEGATIVE 10/17/2021     Electrolytes Lab Results  Component Value Date   NA 140 02/26/2022   K 4.0 02/26/2022   CL 110 02/26/2022   CALCIUM 8.9 02/26/2022   MG 1.4 (L) 01/10/2020   PHOS 2.8 01/10/2020     Hepatic Lab Results  Component Value Date   AST 79 (H) 02/26/2022   ALT 59 (H) 02/26/2022   ALBUMIN 4.8 02/26/2022   ALKPHOS 82 02/26/2022   AMYLASE 121 (H) 05/15/2020   LIPASE 31 03/04/2021   AMMONIA 48 (H) 05/17/2020     ID Lab Results  Component Value Date   HIV Non Reactive 05/17/2020   SARSCOV2NAA NEGATIVE 02/26/2022   MRSAPCR POSITIVE (A) 01/10/2020   HCVAB  NON REACTIVE 05/17/2020     Bone No results found for: "VD25OH", "VD125OH2TOT", "UJ8119JY7", "WG9562ZH0", "25OHVITD1", "25OHVITD2", "25OHVITD3", "TESTOFREE", "TESTOSTERONE"   Endocrine Lab Results  Component Value Date   GLUCOSE 108 (H) 02/26/2022   GLUCOSEU NEGATIVE 10/17/2021   HGBA1C 5.3 11/08/2020   TSH 0.843 07/16/2020   FREET4 0.88 07/16/2020   CRTSLPL 20.8 07/15/2019     Neuropathy Lab Results  Component Value Date   VITAMINB12 784 10/28/2017   FOLATE 40.0 10/28/2017   HGBA1C 5.3 11/08/2020   HIV Non Reactive 05/17/2020     CNS No results found for: "COLORCSF",  "APPEARCSF", "RBCCOUNTCSF", "WBCCSF", "POLYSCSF", "LYMPHSCSF", "EOSCSF", "PROTEINCSF", "GLUCCSF", "JCVIRUS", "CSFOLI", "IGGCSF", "LABACHR", "ACETBL"   Inflammation (CRP: Acute  ESR: Chronic) Lab Results  Component Value Date   LATICACIDVEN 3.2 (HH) 10/17/2021     Rheumatology No results found for: "RF", "ANA", "LABURIC", "URICUR", "LYMEIGGIGMAB", "LYMEABIGMQN", "HLAB27"   Coagulation Lab Results  Component Value Date   INR 1.1 05/17/2020   LABPROT 13.9 05/17/2020   APTT 33 05/17/2020   PLT 314 11/08/2022     Cardiovascular Lab Results  Component Value Date   HGB 15.1 11/08/2022   HCT 44.2 11/08/2022     Screening Lab Results  Component Value Date   SARSCOV2NAA NEGATIVE 02/26/2022   MRSAPCR POSITIVE (A) 01/10/2020   HCVAB NON REACTIVE 05/17/2020   HIV Non Reactive 05/17/2020     Cancer No results found for: "CEA", "CA125", "LABCA2"   Allergens No results found for: "ALMOND", "APPLE", "ASPARAGUS", "AVOCADO", "BANANA", "BARLEY", "BASIL", "BAYLEAF", "GREENBEAN", "LIMABEAN", "WHITEBEAN", "BEEFIGE", "REDBEET", "BLUEBERRY", "BROCCOLI", "CABBAGE", "MELON", "CARROT", "CASEIN", "CASHEWNUT", "CAULIFLOWER", "CELERY"     Note: Lab results reviewed.  PFSH  Drug: Ivan Hamilton  reports no history of drug use. Alcohol:  reports current alcohol use of about 21.0 standard drinks of alcohol per week. Tobacco:  reports that he has never smoked. He has never used smokeless tobacco. Medical:  has a past medical history of Alcohol withdrawal (HCC), B12 deficiency, Chronic pain, CVA (cerebral vascular accident) (HCC), Hypertension, Hypothyroidism, and Varicose veins. Family: family history is not on file.  Past Surgical History:  Procedure Laterality Date   CHOLECYSTECTOMY     COLONOSCOPY WITH PROPOFOL N/A 03/22/2021   Procedure: COLONOSCOPY WITH PROPOFOL;  Surgeon: Toledo, Boykin Nearing, MD;  Location: [facility] ENDOSCOPY;  Service: Gastroenterology;  Laterality: N/A;  SPANISH INTERPRETER    ESOPHAGOGASTRODUODENOSCOPY N/A 03/22/2021   Procedure: ESOPHAGOGASTRODUODENOSCOPY (EGD);  Surgeon: Toledo, Boykin Nearing, MD;  Location: [facility] ENDOSCOPY;  Service: Gastroenterology;  Laterality: N/A;   ESOPHAGOGASTRODUODENOSCOPY (EGD) WITH PROPOFOL N/A 10/24/2017   Procedure: ESOPHAGOGASTRODUODENOSCOPY (EGD) WITH PROPOFOL;  Surgeon: Midge Minium, MD;  Location: [facility] ENDOSCOPY;  Service: Endoscopy;  Laterality: N/A;   ESOPHAGOGASTRODUODENOSCOPY (EGD) WITH PROPOFOL N/A 07/12/2019   Procedure: ESOPHAGOGASTRODUODENOSCOPY (EGD) WITH PROPOFOL;  Surgeon: Wyline Mood, MD;  Location: [facility] ENDOSCOPY;  Service: Gastroenterology;  Laterality: N/A;   LEG SURGERY     Active Ambulatory Problems    Diagnosis Date Noted   Chronic post-traumatic stress disorder (PTSD) 09/01/2015   Hypothyroid 09/01/2015   Gastric reflux 09/01/2015   DDD (degenerative disc disease), lumbosacral 09/01/2015   Alcohol withdrawal (HCC) 09/02/2015   Alcohol use disorder, severe, dependence (HCC) 09/02/2015   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/2015   Alcohol withdrawal delirium (HCC)    Hypokalemia 10/22/2017   Intractable vomiting with nausea    Duodenitis    Gastritis without bleeding    Abnormal LFTs (liver function  tests) 03/31/2015   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   Confusion state 03/31/2015   Chronic pain of left lower extremity 03/31/2015   Neuropathy 05/17/2015   Pain of lumbar spine 05/19/2015   Anxiety and depression 04/10/2016   Varicose veins with inflammation 04/25/2015   Vitamin B12 deficiency 08/17/2015   Suicidal behavior 08/28/2018   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/28/2018   Alcohol intoxication (HCC) 08/28/2018   Syncope 07/11/2019   Abdominal pain 07/11/2019   Alcohol abuse 07/11/2019   Thrombocytopenia (HCC) 07/11/2019   Normocytic anemia 07/11/2019   Tobacco abuse 07/11/2019    CVA (cerebral vascular accident) (HCC)    HTN (hypertension) 07/11/2019   Diverticulitis    Transaminitis    Hepatic cirrhosis (HCC)    Metabolic acidosis    Hypotension 01/10/2020   Lip swelling    Hyperbilirubinemia 12/23/2018   Major psychotic depression, recurrent (HCC) 06/01/2019   S/P laparoscopic cholecystectomy 11/02/2017   RUQ abdominal pain 05/17/2020   Severe major depression without psychotic features (HCC) 11/07/2020   Generalized anxiety disorder 11/07/2020   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 In no apparent acute distress There were no vitals filed for this visit. BMI Assessment: Estimated body mass index is 30.11 kg/m as calculated from the following:   Height as of 11/08/22: 5\' 3"  (1.6 m).   Weight as of 11/08/22: 170 lb (77.1 kg).  BMI interpretation table: BMI level Category Range association with higher incidence of chronic pain  <18 kg/m2 Underweight   18.5-24.9 kg/m2 Ideal body weight   25-29.9 kg/m2 Overweight Increased incidence by 20%  30-34.9 kg/m2 Obese (Class I) Increased incidence by 68%  35-39.9 kg/m2 Severe obesity (Class II) Increased incidence by 136%  >40 kg/m2 Extreme obesity (Class III) Increased incidence by 254%   Patient's current BMI Ideal Body weight  There is no height or weight on file to calculate BMI. Patient weight not recorded   BMI Readings from Last 4 Encounters:  11/08/22 30.11 kg/m   11/01/22 28.91 kg/m  02/26/22 27.79 kg/m  10/17/21 28.79 kg/m   Wt Readings from Last 4 Encounters:  11/08/22 170 lb (77.1 kg)  11/01/22 160 lb 9.6 oz (72.8 kg)  02/26/22 167 lb (75.8 kg)  10/17/21 173 lb (78.5 kg)    Psych/Mental status: Alert, oriented x 3 (person, place, & time)       Eyes: PERLA Respiratory: No evidence of acute respiratory distress  Assessment  Primary Diagnosis & Pertinent Problem List: There were no encounter diagnoses.  Visit Diagnosis (New problems to examiner): No diagnosis found. Plan of Care (Initial workup plan)  Note: Ivan Hamilton was reminded that as per protocol, today's visit has been an evaluation only. We have not taken over the patient's controlled substance management.  Problem-specific plan: No problem-specific Assessment & Plan notes found for this encounter.  Lab Orders  No laboratory test(s) ordered today   Imaging Orders  No imaging studies ordered  today   Referral Orders  No referral(s) requested today   Procedure Orders    No procedure(s) ordered today   Pharmacotherapy (current): Medications ordered:  No orders of the defined types were placed in this encounter.  Medications administered during this visit: Anmol Rhoads had no medications administered during this visit.   Analgesic Pharmacotherapy:  Opioid Analgesics: For patients currently taking or requesting to take opioid analgesics, in accordance with [facility] Guidelines, we will assess their risks and indications for the use of these substances. After completing our evaluation, we may offer recommendations, but we no longer take patients for medication management. The prescribing physician will ultimately decide, based on his/her training and level of comfort whether to adopt any of the recommendations, including whether or not to prescribe such medicines.  Membrane stabilizer: To be determined at a later time  Muscle relaxant: To be  determined at a later time  NSAID: To be determined at a later time  Other analgesic(s): To be determined at a later time   Interventional management options: Ivan Hamilton was informed that there is no guarantee that he would be a candidate for interventional therapies. The decision will be based on the results of diagnostic studies, as well as Ivan Hamilton's risk profile.  Procedure(s) under consideration:  Pending results of ordered studies      Interventional Therapies  Risk Factors  Considerations:     Planned  Pending:      Under consideration:   Pending   Completed:   None at this time   Therapeutic  Palliative (PRN) options:   None established   Completed by other providers:   None reported       Provider-requested follow-up: No follow-ups on file.  Future Appointments  Date Time Provider Department Center  11/28/2022  1:00 PM Delano Metz, MD [facility] None    Duration of encounter: *** minutes.  Total time on encounter, as per AMA guidelines included both the face-to-face and non-face-to-face time personally spent by the physician and/or other qualified health care professional(s) on the day of the encounter (includes time in activities that require the physician or other qualified health care professional and does not include time in activities normally performed by clinical staff). Physician's time may include the following activities when performed: Preparing to see the patient (e.g., pre-charting review of records, searching for previously ordered imaging, lab work, and nerve conduction tests) Review of prior analgesic pharmacotherapies. Reviewing PMP Interpreting ordered tests (e.g., lab work, imaging, nerve conduction tests) Performing post-procedure evaluations, including interpretation of diagnostic procedures Obtaining and/or reviewing separately obtained history Performing a medically appropriate examination and/or evaluation Counseling and  educating the patient/family/caregiver Ordering medications, tests, or procedures Referring and communicating with other health care professionals (when not separately reported) Documenting clinical information in the electronic or other health record Independently interpreting results (not separately reported) and communicating results to the patient/ family/caregiver Care coordination (not separately reported)  Note by: Oswaldo Done, MD (TTS technology used. I apologize for any typographical errors that were not detected and corrected.) Date: 11/28/2022; Time: 7:35 AM

## 2022-11-28 ENCOUNTER — Ambulatory Visit (HOSPITAL_BASED_OUTPATIENT_CLINIC_OR_DEPARTMENT_OTHER): Payer: Medicaid Other | Admitting: Pain Medicine

## 2022-11-28 DIAGNOSIS — Z91199 Patient's noncompliance with other medical treatment and regimen due to unspecified reason: Secondary | ICD-10-CM

## 2023-02-19 ENCOUNTER — Other Ambulatory Visit (INDEPENDENT_AMBULATORY_CARE_PROVIDER_SITE_OTHER): Payer: Self-pay

## 2023-02-19 MED ORDER — ALPRAZOLAM 0.5 MG PO TABS: ORAL_TABLET | ORAL | 0 refills | Status: AC

## 2023-02-25 DIAGNOSIS — R079 Chest pain, unspecified: Secondary | ICD-10-CM | POA: Diagnosis not present

## 2023-02-25 DIAGNOSIS — I8310 Varicose veins of unspecified lower extremity with inflammation: Secondary | ICD-10-CM | POA: Diagnosis not present

## 2023-02-25 DIAGNOSIS — F1024 Alcohol dependence with alcohol-induced mood disorder: Secondary | ICD-10-CM | POA: Diagnosis not present

## 2023-02-25 DIAGNOSIS — R110 Nausea: Secondary | ICD-10-CM | POA: Diagnosis not present

## 2023-02-25 DIAGNOSIS — F1010 Alcohol abuse, uncomplicated: Secondary | ICD-10-CM | POA: Diagnosis not present

## 2023-02-25 DIAGNOSIS — R1011 Right upper quadrant pain: Secondary | ICD-10-CM | POA: Diagnosis not present

## 2023-02-25 DIAGNOSIS — G252 Other specified forms of tremor: Secondary | ICD-10-CM | POA: Diagnosis not present

## 2023-02-25 DIAGNOSIS — R413 Other amnesia: Secondary | ICD-10-CM | POA: Diagnosis not present

## 2023-02-25 DIAGNOSIS — F411 Generalized anxiety disorder: Secondary | ICD-10-CM | POA: Diagnosis not present

## 2023-02-25 DIAGNOSIS — R0789 Other chest pain: Secondary | ICD-10-CM | POA: Diagnosis not present

## 2023-02-25 DIAGNOSIS — E039 Hypothyroidism, unspecified: Secondary | ICD-10-CM | POA: Diagnosis not present

## 2023-02-26 ENCOUNTER — Other Ambulatory Visit: Payer: Self-pay | Admitting: Internal Medicine

## 2023-02-26 DIAGNOSIS — R1011 Right upper quadrant pain: Secondary | ICD-10-CM

## 2023-02-26 DIAGNOSIS — F1010 Alcohol abuse, uncomplicated: Secondary | ICD-10-CM

## 2023-03-01 DIAGNOSIS — R220 Localized swelling, mass and lump, head: Secondary | ICD-10-CM | POA: Diagnosis not present

## 2023-03-01 DIAGNOSIS — T7840XA Allergy, unspecified, initial encounter: Secondary | ICD-10-CM | POA: Diagnosis not present

## 2023-03-05 ENCOUNTER — Ambulatory Visit: Payer: No Typology Code available for payment source | Attending: Internal Medicine

## 2023-04-10 ENCOUNTER — Emergency Department: Payer: No Typology Code available for payment source

## 2023-04-10 ENCOUNTER — Emergency Department
Admission: EM | Admit: 2023-04-10 | Discharge: 2023-04-10 | Disposition: A | Discharge status: Home / Self Care | Payer: No Typology Code available for payment source | Attending: Emergency Medicine | Admitting: Emergency Medicine

## 2023-04-10 DIAGNOSIS — S6992XA Unspecified injury of left wrist, hand and finger(s), initial encounter: Secondary | ICD-10-CM | POA: Diagnosis present

## 2023-04-10 DIAGNOSIS — Y906 Blood alcohol level of 120-199 mg/100 ml: Secondary | ICD-10-CM | POA: Diagnosis not present

## 2023-04-10 DIAGNOSIS — M25532 Pain in left wrist: Secondary | ICD-10-CM | POA: Insufficient documentation

## 2023-04-10 DIAGNOSIS — K7460 Unspecified cirrhosis of liver: Secondary | ICD-10-CM | POA: Diagnosis not present

## 2023-04-10 DIAGNOSIS — R0602 Shortness of breath: Secondary | ICD-10-CM | POA: Diagnosis not present

## 2023-04-10 DIAGNOSIS — R55 Syncope and collapse: Secondary | ICD-10-CM | POA: Diagnosis not present

## 2023-04-10 DIAGNOSIS — F10129 Alcohol abuse with intoxication, unspecified: Secondary | ICD-10-CM | POA: Diagnosis not present

## 2023-04-10 DIAGNOSIS — S52532A Colles' fracture of left radius, initial encounter for closed fracture: Secondary | ICD-10-CM | POA: Diagnosis not present

## 2023-04-10 DIAGNOSIS — R0902 Hypoxemia: Secondary | ICD-10-CM | POA: Insufficient documentation

## 2023-04-10 DIAGNOSIS — M47812 Spondylosis without myelopathy or radiculopathy, cervical region: Secondary | ICD-10-CM | POA: Diagnosis not present

## 2023-04-10 DIAGNOSIS — I6782 Cerebral ischemia: Secondary | ICD-10-CM | POA: Insufficient documentation

## 2023-04-10 DIAGNOSIS — Z20822 Contact with and (suspected) exposure to covid-19: Secondary | ICD-10-CM | POA: Diagnosis not present

## 2023-04-10 DIAGNOSIS — W01198A Fall on same level from slipping, tripping and stumbling with subsequent striking against other object, initial encounter: Secondary | ICD-10-CM | POA: Insufficient documentation

## 2023-04-10 DIAGNOSIS — G9389 Other specified disorders of brain: Secondary | ICD-10-CM | POA: Diagnosis not present

## 2023-04-10 DIAGNOSIS — I10 Essential (primary) hypertension: Secondary | ICD-10-CM | POA: Diagnosis not present

## 2023-04-10 DIAGNOSIS — G894 Chronic pain syndrome: Secondary | ICD-10-CM | POA: Insufficient documentation

## 2023-04-10 DIAGNOSIS — S6730XA Crushing injury of unspecified wrist, initial encounter: Secondary | ICD-10-CM | POA: Diagnosis not present

## 2023-04-10 DIAGNOSIS — W19XXXA Unspecified fall, initial encounter: Secondary | ICD-10-CM | POA: Diagnosis not present

## 2023-04-10 DIAGNOSIS — F10920 Alcohol use, unspecified with intoxication, uncomplicated: Secondary | ICD-10-CM

## 2023-04-10 DIAGNOSIS — R42 Dizziness and giddiness: Secondary | ICD-10-CM | POA: Insufficient documentation

## 2023-04-10 DIAGNOSIS — Y9301 Activity, walking, marching and hiking: Secondary | ICD-10-CM | POA: Insufficient documentation

## 2023-04-10 DIAGNOSIS — Z043 Encounter for examination and observation following other accident: Secondary | ICD-10-CM | POA: Diagnosis not present

## 2023-04-10 DIAGNOSIS — S52572A Other intraarticular fracture of lower end of left radius, initial encounter for closed fracture: Secondary | ICD-10-CM | POA: Diagnosis not present

## 2023-04-10 DIAGNOSIS — Z8673 Personal history of transient ischemic attack (TIA), and cerebral infarction without residual deficits: Secondary | ICD-10-CM | POA: Insufficient documentation

## 2023-04-10 DIAGNOSIS — R531 Weakness: Secondary | ICD-10-CM | POA: Diagnosis not present

## 2023-04-10 LAB — COMPREHENSIVE METABOLIC PANEL WITH GFR
ALT: 29 U/L (ref 0–44)
AST: 30 U/L (ref 15–41)
Albumin: 3.8 g/dL (ref 3.5–5.0)
Alkaline Phosphatase: 57 U/L (ref 38–126)
Anion gap: 11 (ref 5–15)
BUN: 8 mg/dL (ref 6–20)
CO2: 21 mmol/L — ABNORMAL LOW (ref 22–32)
Calcium: 8.5 mg/dL — ABNORMAL LOW (ref 8.9–10.3)
Chloride: 105 mmol/L (ref 98–111)
Creatinine, Ser: 0.82 mg/dL (ref 0.61–1.24)
GFR, Estimated: >60 mL/min (ref >60)
Glucose, Bld: 85 mg/dL (ref 70–99)
Potassium: 4.1 mmol/L (ref 3.5–5.1)
Sodium: 137 mmol/L (ref 135–145)
Total Bilirubin: 0.8 mg/dL (ref 0.3–1.2)
Total Protein: 7.8 g/dL (ref 6.5–8.1)

## 2023-04-10 LAB — CBC WITH DIFFERENTIAL/PLATELET
Abs Immature Granulocytes: 0.04 10*3/uL (ref 0.00–0.07)
Basophils Absolute: 0.1 10*3/uL (ref 0.0–0.1)
Basophils Relative: 1 %
Eosinophils Absolute: 0.1 10*3/uL (ref 0.0–0.5)
Eosinophils Relative: 1 %
HCT: 38.8 % — ABNORMAL LOW (ref 39.0–52.0)
Hemoglobin: 13 g/dL (ref 13.0–17.0)
Immature Granulocytes: 1 %
Lymphocytes Relative: 20 %
Lymphs Abs: 1.7 10*3/uL (ref 0.7–4.0)
MCH: 30.7 pg (ref 26.0–34.0)
MCHC: 33.5 g/dL (ref 30.0–36.0)
MCV: 91.5 fL (ref 80.0–100.0)
Monocytes Absolute: 0.5 10*3/uL (ref 0.1–1.0)
Monocytes Relative: 6 %
Neutro Abs: 5.9 10*3/uL (ref 1.7–7.7)
Neutrophils Relative %: 71 %
Platelets: 209 10*3/uL (ref 150–400)
RBC: 4.24 MIL/uL (ref 4.22–5.81)
RDW: 14 % (ref 11.5–15.5)
WBC: 8.3 10*3/uL (ref 4.0–10.5)
nRBC: 0 % (ref 0.0–0.2)

## 2023-04-10 LAB — TROPONIN I (HIGH SENSITIVITY): Troponin I (High Sensitivity): 5 ng/L (ref <18)

## 2023-04-10 LAB — MAGNESIUM: Magnesium: 2 mg/dL (ref 1.7–2.4)

## 2023-04-10 LAB — SARS CORONAVIRUS 2 BY RT PCR: SARS Coronavirus 2 by RT PCR: NEGATIVE

## 2023-04-10 LAB — ETHANOL: Alcohol, Ethyl (B): 185 mg/dL — ABNORMAL HIGH (ref <10)

## 2023-04-10 MED ORDER — SODIUM CHLORIDE 0.9 % IV BOLUS
1000.0000 mL | Freq: Once | INTRAVENOUS | Status: AC
  Administered 2023-04-10: 1000 mL via INTRAVENOUS

## 2023-04-10 MED ORDER — OXYCODONE-ACETAMINOPHEN 5-325 MG PO TABS: 1.0000 | ORAL_TABLET | ORAL | 0 refills | Status: AC | PRN

## 2023-04-10 MED ORDER — MORPHINE SULFATE (PF) 4 MG/ML IV SOLN
4.0000 mg | Freq: Once | INTRAVENOUS | Status: AC
  Administered 2023-04-10: 4 mg via INTRAVENOUS
  Filled 2023-04-10: qty 1

## 2023-04-10 MED ORDER — OXYCODONE-ACETAMINOPHEN 5-325 MG PO TABS
1.0000 | ORAL_TABLET | Freq: Once | ORAL | Status: DC
  Filled 2023-04-10: qty 1

## 2023-04-10 NOTE — ED Provider Notes (Signed)
[facility] Provider Note    Event Date/Time   First MD Initiated Contact with Patient 04/10/23 1558     (approximate)   History   Chief Complaint Fall   HPI  Ivan Hamilton is a 56 y.o. male with past medical history of hypertension, alcohol abuse, cirrhosis, stroke, chronic pain syndrome, and PTSD who presents to the ED for fall.  Patient reports that just prior to arrival he was walking to his dentist office when he began to feel dizzy and lightheaded.  He eventually lost consciousness, reports falling to the ground and striking both his head and his left wrist.  He states that he tried to get up and passed out for a second time, denies any chest pain or shortness of breath with these episodes.  He reports ongoing headache and left wrist pain, denies any neck pain, numbness, or weakness in his extremities.  He was able to get himself to his dentist office, where they called 911 for him.  He admits some alcohol consumption yesterday, but denies any alcohol use today.     Physical Exam   Triage Vital Signs: ED Triage Vitals  Encounter Vitals Group     BP 04/10/23 1602 107/69     Systolic BP Percentile --      Diastolic BP Percentile --      Pulse Rate 04/10/23 1602 76     Resp 04/10/23 1602 18     Temp 04/10/23 1602 99 F (37.2 C)     Temp Source 04/10/23 1602 Oral     SpO2 04/10/23 1602 (!) 89 %     Weight 04/10/23 1603 157 lb (71.2 kg)     Height 04/10/23 1603 5\' 5"  (1.651 m)     Head Circumference --      Peak Flow --      Pain Score --      Pain Loc --      Pain Education --      Exclude from Growth Chart --     Most recent vital signs: Vitals:   04/10/23 1602 04/10/23 1856  BP: 107/69 127/82  Pulse: 76 71  Resp: 18 18  Temp: 99 F (37.2 C) 98 F (36.7 C)  SpO2: (!) 89% 99%    Constitutional: Alert and oriented. Eyes: Conjunctivae are normal. Head: Atraumatic. Nose: No congestion/rhinnorhea. Mouth/Throat: Mucous membranes  are moist.  Neck: Cervical collar in place, no midline cervical spine tenderness to palpation. Cardiovascular: Normal rate, regular rhythm. Grossly normal heart sounds.  2+ radial pulses bilaterally. Respiratory: Normal respiratory effort.  No retractions. Lungs CTAB.  No chest wall tenderness to palpation. Gastrointestinal: Soft and nontender. No distention. Musculoskeletal: No lower extremity tenderness nor edema.  Diffuse tenderness to palpation at left wrist with deformity, no tenderness at elbow or shoulder. Neurologic:  Normal speech and language. No gross focal neurologic deficits are appreciated.    ED Results / Procedures / Treatments   Labs (all labs ordered are listed, but only abnormal results are displayed) Labs Reviewed  CBC WITH DIFFERENTIAL/PLATELET - Abnormal; Notable for the following components:      Result Value   HCT 38.8 (*)    All other components within normal limits  COMPREHENSIVE METABOLIC PANEL - Abnormal; Notable for the following components:   CO2 21 (*)    Calcium 8.5 (*)    All other components within normal limits  ETHANOL - Abnormal; Notable for the following components:   Alcohol, Ethyl (B) 185 (*)  All other components within normal limits  SARS CORONAVIRUS 2 BY RT PCR  MAGNESIUM  TROPONIN I (HIGH SENSITIVITY)     EKG  ED ECG REPORT I, Chesley Noon, the attending physician, personally viewed and interpreted this ECG.   Date: 04/10/2023  EKG Time: 16:37  Rate: 74  Rhythm: normal sinus rhythm  Axis: Normal  Intervals:none  ST&T Change: None  RADIOLOGY CT head reviewed and interpreted by me with no hemorrhage or midline shift.  PROCEDURES:  Critical Care performed: No  Procedures   MEDICATIONS ORDERED IN ED: Medications  oxyCODONE-acetaminophen (PERCOCET/ROXICET) 5-325 MG per tablet 1 tablet (1 tablet Oral Not Given 04/10/23 1851)  sodium chloride 0.9 % bolus 1,000 mL (0 mLs Intravenous Stopped 04/10/23 1858)  morphine (PF)  4 MG/ML injection 4 mg (4 mg Intravenous Given 04/10/23 1847)     IMPRESSION / MDM / ASSESSMENT AND PLAN / ED COURSE  I reviewed the triage vital signs and the nursing notes.                              57 y.o. male with past medical history of hypertension, stroke, alcohol abuse, cirrhosis, PTSD, and chronic pain syndrome who presents to the ED complaining of 2 separate syncopal episodes where he fell and hit his head as well as his left wrist.  Patient's presentation is most consistent with acute presentation with potential threat to life or bodily function.  Differential diagnosis includes, but is not limited to, arrhythmia, vasovagal episode, dehydration, orthostatic hypotension, alcohol intoxication, intracranial injury, cervical spine injury, wrist fracture, pneumonia, viral syndrome.  Patient nontoxic-appearing and in no acute distress, vital signs remarkable for hypoxia at 89%, however patient's oxygen saturations improved with repositioning and he denies any difficulty breathing.  EKG shows no evidence of arrhythmia or ischemia, will observe on cardiac monitor but low suspicion for cardiac etiology for syncope at this time.  We will hydrate with IV fluids, further assess with labs and CT of head and cervical spine as well as x-ray of left wrist and chest.  Patient declines pain medication at this time.  CT head and cervical spine are negative for acute process, chest x-ray is unremarkable and patient continues to brief comfortably on room air with oxygen saturations of 99%.  Left wrist x-ray does show distal radius fracture with mild dorsal angulation.  Patient was placed in splint with slight pressure on the radial head to improve angulation.  He was offered admission to the hospital for further syncope workup, but declines, was advised to follow-up with cardiology and orthopedics.  Labs are reassuring with no significant anemia, leukocytosis, tract abnormality, or AKI.  Patient did have  mild elevation in blood alcohol level but now appears clinically sober.  He was counseled to return to the ED for new or worsening symptoms, patient and spouse agree with plan.      FINAL CLINICAL IMPRESSION(S) / ED DIAGNOSES   Final diagnoses:  Syncope, unspecified syncope type  Closed Colles' fracture of left radius, initial encounter  Alcoholic intoxication without complication [facility])     Rx / DC Orders   ED Discharge Orders          Ordered    oxyCODONE-acetaminophen (PERCOCET) 5-325 MG tablet  Every 4 hours PRN        04/10/23 1918    Ambulatory referral to Cardiology        04/10/23 1919  Note:  This document was prepared using Dragon voice recognition software and may include unintentional dictation errors.   Chesley Noon, MD 04/10/23 [phone]

## 2023-04-10 NOTE — ED Triage Notes (Signed)
Pt BIB EMS for a fall. Reports that the patient was walking to his dentist office when he passed out injuring his L wrist and left orbit. Patient states he has felt sick the last couple days.

## 2023-04-18 DIAGNOSIS — S52532A Colles' fracture of left radius, initial encounter for closed fracture: Secondary | ICD-10-CM | POA: Diagnosis not present

## 2023-04-18 DIAGNOSIS — M545 Low back pain, unspecified: Secondary | ICD-10-CM | POA: Diagnosis not present

## 2023-05-02 ENCOUNTER — Other Ambulatory Visit (INDEPENDENT_AMBULATORY_CARE_PROVIDER_SITE_OTHER): Payer: No Typology Code available for payment source | Admitting: Vascular Surgery

## 2023-05-09 ENCOUNTER — Encounter (INDEPENDENT_AMBULATORY_CARE_PROVIDER_SITE_OTHER): Payer: No Typology Code available for payment source

## 2023-05-30 ENCOUNTER — Ambulatory Visit (INDEPENDENT_AMBULATORY_CARE_PROVIDER_SITE_OTHER): Payer: No Typology Code available for payment source | Admitting: Vascular Surgery

## 2023-06-27 DIAGNOSIS — F411 Generalized anxiety disorder: Secondary | ICD-10-CM | POA: Diagnosis not present

## 2023-06-27 DIAGNOSIS — K7030 Alcoholic cirrhosis of liver without ascites: Secondary | ICD-10-CM | POA: Diagnosis not present

## 2023-06-27 DIAGNOSIS — F1010 Alcohol abuse, uncomplicated: Secondary | ICD-10-CM | POA: Diagnosis not present

## 2023-06-27 DIAGNOSIS — E039 Hypothyroidism, unspecified: Secondary | ICD-10-CM | POA: Diagnosis not present

## 2023-07-15 ENCOUNTER — Other Ambulatory Visit (INDEPENDENT_AMBULATORY_CARE_PROVIDER_SITE_OTHER): Payer: No Typology Code available for payment source | Admitting: Vascular Surgery

## 2023-07-15 NOTE — Progress Notes (Deleted)
    MRN : [id number]  Lamare Antes is a 57 y.o. ([birth year]) male who presents with chief complaint of No chief complaint on file. .    The patient's *** lower extremity was sterilely prepped and draped.  The ultrasound machine was used to visualize the *** saphenous vein throughout its course.  A segment *** was selected for access.  The saphenous vein was accessed without difficulty using ultrasound guidance with a micropuncture needle.   An 0.018  wire was placed beyond the saphenofemoral junction through the sheath and the microneedle was removed.  The 65 cm sheath was then placed over the wire and the wire and dilator were removed.  The laser fiber was placed through the sheath and its tip was placed approximately 2 cm below the saphenofemoral junction.  Tumescent anesthesia was then created with a dilute lidocaine solution.  Laser energy was then delivered with constant withdrawal of the sheath and laser fiber.  Approximately *** Joules of energy were delivered over a length of *** cm.  Sterile dressings were placed.  The patient tolerated the procedure well without complications.

## 2023-07-22 ENCOUNTER — Encounter (INDEPENDENT_AMBULATORY_CARE_PROVIDER_SITE_OTHER): Payer: No Typology Code available for payment source

## 2023-08-09 DIAGNOSIS — F1024 Alcohol dependence with alcohol-induced mood disorder: Secondary | ICD-10-CM | POA: Diagnosis not present

## 2023-08-09 DIAGNOSIS — K7030 Alcoholic cirrhosis of liver without ascites: Secondary | ICD-10-CM | POA: Diagnosis not present

## 2023-08-09 DIAGNOSIS — E039 Hypothyroidism, unspecified: Secondary | ICD-10-CM | POA: Diagnosis not present

## 2023-08-09 DIAGNOSIS — M549 Dorsalgia, unspecified: Secondary | ICD-10-CM | POA: Diagnosis not present

## 2023-08-09 DIAGNOSIS — I8510 Secondary esophageal varices without bleeding: Secondary | ICD-10-CM | POA: Diagnosis not present

## 2023-08-09 DIAGNOSIS — M25532 Pain in left wrist: Secondary | ICD-10-CM | POA: Diagnosis not present

## 2023-08-09 DIAGNOSIS — F411 Generalized anxiety disorder: Secondary | ICD-10-CM | POA: Diagnosis not present

## 2023-08-09 DIAGNOSIS — F333 Major depressive disorder, recurrent, severe with psychotic symptoms: Secondary | ICD-10-CM | POA: Diagnosis not present

## 2023-08-09 DIAGNOSIS — R7309 Other abnormal glucose: Secondary | ICD-10-CM | POA: Diagnosis not present

## 2023-08-09 DIAGNOSIS — Z1339 Encounter for screening examination for other mental health and behavioral disorders: Secondary | ICD-10-CM | POA: Diagnosis not present

## 2023-08-10 NOTE — Progress Notes (Deleted)
MRN : [id number]  Ivan Hamilton is a 57 y.o. (June 23, 1966) male who presents with chief complaint of varicose veins hurt.  History of Present Illness:   The patient returns for followup evaluation 3 months after the initial visit. The patient continues to have pain in the lower extremities with dependency. The pain is lessened with elevation. Graduated compression stockings, Class I (20-30 mmHg), have been worn but the stockings do not eliminate the leg pain. Over-the-counter analgesics do not improve the symptoms. The degree of discomfort continues to interfere with daily activities. The patient notes the pain in the legs is causing problems with daily exercise, at the workplace and even with household activities and maintenance such as standing in the kitchen preparing meals and doing dishes.   Venous ultrasound dated November 01, 2022 shows normal deep venous system, no evidence of acute or chronic DVT.  Superficial reflux is present in the left great saphenous vein.   No outpatient medications have been marked as taking for the 08/12/23 encounter (Appointment) with Gilda Crease, Latina Craver, MD.    Past Medical History:  Diagnosis Date   Alcohol withdrawal (HCC)    B12 deficiency    Chronic pain    CVA (cerebral vascular accident) (HCC)    Hypertension    Hypothyroidism    Varicose veins     Past Surgical History:  Procedure Laterality Date   CHOLECYSTECTOMY     COLONOSCOPY WITH PROPOFOL N/A 03/22/2021   Procedure: COLONOSCOPY WITH PROPOFOL;  Surgeon: Toledo, Boykin Nearing, MD;  Location: [facility] ENDOSCOPY;  Service: Gastroenterology;  Laterality: N/A;  SPANISH INTERPRETER   ESOPHAGOGASTRODUODENOSCOPY N/A 03/22/2021   Procedure: ESOPHAGOGASTRODUODENOSCOPY (EGD);  Surgeon: Toledo, Boykin Nearing, MD;  Location: [facility] ENDOSCOPY;  Service: Gastroenterology;  Laterality: N/A;   ESOPHAGOGASTRODUODENOSCOPY (EGD) WITH PROPOFOL N/A 10/24/2017   Procedure: ESOPHAGOGASTRODUODENOSCOPY (EGD) WITH  PROPOFOL;  Surgeon: Midge Minium, MD;  Location: [facility] ENDOSCOPY;  Service: Endoscopy;  Laterality: N/A;   ESOPHAGOGASTRODUODENOSCOPY (EGD) WITH PROPOFOL N/A 07/12/2019   Procedure: ESOPHAGOGASTRODUODENOSCOPY (EGD) WITH PROPOFOL;  Surgeon: Wyline Mood, MD;  Location: [facility] ENDOSCOPY;  Service: Gastroenterology;  Laterality: N/A;   LEG SURGERY      Social History Social History   Tobacco Use   Smoking status: Never   Smokeless tobacco: Never  Vaping Use   Vaping status: Never Used  Substance Use Topics   Alcohol use: Yes    Alcohol/week: 21.0 standard drinks of alcohol    Types: 21 Shots of liquor per week    Comment: daily- 1 pint of vodka   Drug use: No    Family History No family history on file.  No Known Allergies   REVIEW OF SYSTEMS (Negative unless checked)  Constitutional: [] Weight loss  [] Fever  [] Chills Cardiac: [] Chest pain   [] Chest pressure   [] Palpitations   [] Shortness of breath when laying flat   [] Shortness of breath with exertion. Vascular:  [] Pain in legs with walking   [x] Pain in legs with standing  [] History of DVT   [] Phlebitis   [] Swelling in legs   [x] Varicose veins   [] Non-healing ulcers Pulmonary:   [] Uses home oxygen   [] Productive cough   [] Hemoptysis   [] Wheeze  [] COPD   [] Asthma Neurologic:  [] Dizziness   [] Seizures   [] History of stroke   [] History of TIA  [] Aphasia   [] Vissual changes   [] Weakness or numbness in arm   [] Weakness or numbness in leg Musculoskeletal:   []   Joint swelling   [] Joint pain   [x] Low back pain Hematologic:  [] Easy bruising  [] Easy bleeding   [] Hypercoagulable state   [] Anemic Gastrointestinal:  [] Diarrhea   [] Vomiting  [] Gastroesophageal reflux/heartburn   [] Difficulty swallowing. Genitourinary:  [] Chronic kidney disease   [] Difficult urination  [] Frequent urination   [] Blood in urine Skin:  [] Rashes   [] Ulcers  Psychological:  [x] History of anxiety   []  History of major depression.  Physical Examination  There were no  vitals filed for this visit. There is no height or weight on file to calculate BMI. Gen: WD/WN, NAD Head: [place]/AT, No temporalis wasting.  Ear/Nose/Throat: Hearing grossly intact, nares w/o erythema or drainage, pinna without lesions Eyes: PER, EOMI, sclera nonicteric.  Neck: Supple, no gross masses.  No JVD.  Pulmonary:  Good air movement, no audible wheezing, no use of accessory muscles.  Cardiac: RRR, precordium not hyperdynamic. Vascular:  Large varicosities present, greater than 10 mm ***.  Veins are tender to palpation  Mild venous stasis changes to the legs bilaterally.  Trace soft pitting edema CEAP C3sEpAsPr Vessel Right Left  Radial Palpable Palpable  Gastrointestinal: soft, non-distended. No guarding/no peritoneal signs.  Musculoskeletal: M/S 5/5 throughout.  No deformity.  Neurologic: CN 2-12 intact. Pain and light touch intact in extremities.  Symmetrical.  Speech is fluent. Motor exam as listed above. Psychiatric: Judgment intact, Mood & affect appropriate for pt's clinical situation. Dermatologic: Venous rashes no ulcers noted.  No changes consistent with cellulitis. Lymph : No lichenification or skin changes of chronic lymphedema.  CBC Lab Results  Component Value Date   WBC 8.3 04/10/2023   HGB 13.0 04/10/2023   HCT 38.8 (L) 04/10/2023   MCV 91.5 04/10/2023   PLT 209 04/10/2023    BMET    Component Value Date/Time   NA 137 04/10/2023 1634   NA 141 08/17/2014 0527   K 4.1 04/10/2023 1634   K 3.6 08/17/2014 0527   CL 105 04/10/2023 1634   CL 106 08/17/2014 0527   CO2 21 (L) 04/10/2023 1634   CO2 26 08/17/2014 0527   GLUCOSE 85 04/10/2023 1634   GLUCOSE 89 08/17/2014 0527   BUN 8 04/10/2023 1634   BUN 3 (L) 08/17/2014 0527   CREATININE 0.82 04/10/2023 1634   CREATININE 0.69 08/17/2014 0527   CALCIUM 8.5 (L) 04/10/2023 1634   CALCIUM 9.0 08/17/2014 0527   GFRNONAA >60 04/10/2023 1634   GFRNONAA >60 08/17/2014 0527   GFRAA >60 01/14/2020 0444   GFRAA >60  08/17/2014 0527   CrCl cannot be calculated (Patient's most recent lab result is older than the maximum 21 days allowed.).  COAG Lab Results  Component Value Date   INR 1.1 05/17/2020   INR 1.3 (H) 01/10/2020   INR 1.2 01/09/2020    Radiology No results found.   Assessment/Plan There are no diagnoses linked to this encounter.   Levora Dredge, MD  08/10/2023 3:32 PM

## 2023-08-12 ENCOUNTER — Ambulatory Visit (INDEPENDENT_AMBULATORY_CARE_PROVIDER_SITE_OTHER): Payer: No Typology Code available for payment source | Admitting: Vascular Surgery

## 2023-08-12 DIAGNOSIS — M51371 Other intervertebral disc degeneration, lumbosacral region with lower extremity pain only: Secondary | ICD-10-CM

## 2023-08-12 DIAGNOSIS — K219 Gastro-esophageal reflux disease without esophagitis: Secondary | ICD-10-CM

## 2023-08-12 DIAGNOSIS — I10 Essential (primary) hypertension: Secondary | ICD-10-CM

## 2023-08-12 DIAGNOSIS — I8310 Varicose veins of unspecified lower extremity with inflammation: Secondary | ICD-10-CM

## 2023-10-24 ENCOUNTER — Other Ambulatory Visit: Payer: Self-pay

## 2023-10-24 ENCOUNTER — Emergency Department
Admission: EM | Admit: 2023-10-24 | Discharge: 2023-10-24 | Disposition: A | Discharge status: Home / Self Care | Attending: Emergency Medicine | Admitting: Emergency Medicine

## 2023-10-24 DIAGNOSIS — F10120 Alcohol abuse with intoxication, uncomplicated: Secondary | ICD-10-CM | POA: Insufficient documentation

## 2023-10-24 DIAGNOSIS — F10129 Alcohol abuse with intoxication, unspecified: Secondary | ICD-10-CM | POA: Diagnosis not present

## 2023-10-24 DIAGNOSIS — F10920 Alcohol use, unspecified with intoxication, uncomplicated: Secondary | ICD-10-CM

## 2023-10-24 NOTE — ED Notes (Signed)
 Pt intoxicated. Refusing to participate in assessment. Pt medically cleared by Ray MD for d/c into police custody

## 2023-10-24 NOTE — Discharge Instructions (Addendum)
Return to the ER for new or worsening symptoms. °

## 2023-10-24 NOTE — ED Triage Notes (Signed)
 Pt presents in [employer] custody for medical clearance due to alcohol intoxication. Pt yelling and kicking at officers and staff during triage assessment.

## 2023-10-24 NOTE — ED Provider Notes (Signed)
 [facility] Provider Note    Event Date/Time   First MD Initiated Contact with Patient 10/24/23 2011     (approximate)   History   Alcohol Intoxication   HPI  Ivan Hamilton is a 58 year old male presenting for medical clearance.  Patient was arrested by [employer] shortly prior to presentation.  His breathalyzer was 0.35, per protocol requiring medical clearance.  Spoke with the officer who was present when patient was arrested.  No falls or head trauma.  On my evaluation, patient repeatedly stating "just go".  No reported pain or medical complaints.    Physical Exam   Triage Vital Signs: ED Triage Vitals  Encounter Vitals Group     BP 10/24/23 2008 (!) 133/97     Systolic BP Percentile --      Diastolic BP Percentile --      Pulse Rate 10/24/23 2008 (!) 115     Resp 10/24/23 2008 18     Temp 10/24/23 2011 98.2 F (36.8 C)     Temp Source 10/24/23 2011 Axillary     SpO2 10/24/23 2008 97 %     Weight --      Height --      Head Circumference --      Peak Flow --      Pain Score 10/24/23 2011 0     Pain Loc --      Pain Education --      Exclude from Growth Chart --     Most recent vital signs: Vitals:   10/24/23 2008 10/24/23 2011  BP: (!) 133/97   Pulse: (!) 115   Resp: 18   Temp:  98.2 F (36.8 C)  SpO2: 97%      General: Awake, interactive  Head:  Atraumatic CV:  Tachycardic, normal peripheral perfusion Resp:  Unlabored respirations.  Abd:  Nondistended, nontender Neuro:  No gross facial asymmetry, moving extremities spontaneously, speech mildly impaired coordination consistent with history of alcohol intoxication   ED Results / Procedures / Treatments   Labs (all labs ordered are listed, but only abnormal results are displayed) Labs Reviewed - No data to display   EKG EKG independently reviewed interpreted by myself (ER attending) demonstrates:    RADIOLOGY Imaging independently reviewed and interpreted by myself  demonstrates:    PROCEDURES:  Critical Care performed: No  Procedures   MEDICATIONS ORDERED IN ED: Medications - No data to display   IMPRESSION / MDM / ASSESSMENT AND PLAN / ED COURSE  I reviewed the triage vital signs and the nursing notes.  Differential diagnosis includes, but is not limited to, acute alcohol intoxication, no clinical history or physical exam finding suggestive of acute trauma, no medical complaints, overall low suspicion for emergent process  Patient's presentation is most consistent with acute, uncomplicated illness.  58 year old presenting with medical clearance in the setting of alcohol intoxication.  Clinical presentation is consistent with known history of alcohol intoxication without evidence of emergent medical process.  Do think patient can be medically cleared and discharged in police custody.  Strict return precautions provided.     FINAL CLINICAL IMPRESSION(S) / ED DIAGNOSES   Final diagnoses:  Alcoholic intoxication without complication (HCC)     Rx / DC Orders   ED Discharge Orders     None        Note:  This document was prepared using Dragon voice recognition software and may include unintentional dictation errors.   Trinna Post, MD 10/24/23 2022

## 2023-12-10 ENCOUNTER — Encounter (INDEPENDENT_AMBULATORY_CARE_PROVIDER_SITE_OTHER): Payer: Self-pay

## 2024-01-03 ENCOUNTER — Emergency Department
Admission: EM | Admit: 2024-01-03 | Discharge: 2024-01-03 | Disposition: A | Discharge status: Home / Self Care | Attending: Emergency Medicine | Admitting: Emergency Medicine

## 2024-01-03 ENCOUNTER — Emergency Department

## 2024-01-03 DIAGNOSIS — M5442 Lumbago with sciatica, left side: Secondary | ICD-10-CM | POA: Diagnosis not present

## 2024-01-03 DIAGNOSIS — M5432 Sciatica, left side: Secondary | ICD-10-CM

## 2024-01-03 DIAGNOSIS — M545 Low back pain, unspecified: Secondary | ICD-10-CM | POA: Diagnosis not present

## 2024-01-03 DIAGNOSIS — I10 Essential (primary) hypertension: Secondary | ICD-10-CM | POA: Insufficient documentation

## 2024-01-03 DIAGNOSIS — F1090 Alcohol use, unspecified, uncomplicated: Secondary | ICD-10-CM | POA: Insufficient documentation

## 2024-01-03 DIAGNOSIS — M2578 Osteophyte, vertebrae: Secondary | ICD-10-CM | POA: Diagnosis not present

## 2024-01-03 DIAGNOSIS — I700 Atherosclerosis of aorta: Secondary | ICD-10-CM | POA: Diagnosis not present

## 2024-01-03 MED ORDER — DEXAMETHASONE SODIUM PHOSPHATE 10 MG/ML IJ SOLN
8.0000 mg | Freq: Once | INTRAMUSCULAR | Status: AC
  Administered 2024-01-03: 8 mg via INTRAMUSCULAR
  Filled 2024-01-03: qty 1

## 2024-01-03 MED ORDER — PREDNISONE 10 MG (21) PO TBPK: ORAL_TABLET | ORAL | 0 refills | Status: AC

## 2024-01-03 NOTE — ED Provider Notes (Signed)
 [facility] Provider Note    Event Date/Time   First MD Initiated Contact with Patient 01/03/24 1609     (approximate)   History   Back Pain   HPI Ivan Hamilton is a 58 y.o. male presenting to the emergency department with back pain that radiates down his left posterior leg since midnight last night.  Patient describes it as numbing and tingling.  Patient states he is able to walk but this does cause him significant pain. States this has never happened before. Pain level in triage was an 8 out of 10.  He reports he does walk 1 to 1.5 hours in the morning and sometimes at night as well.  Denies fall, injury, fever, IV drug use, saddle anesthesia, bowel or bladder incontinence, dysuria, urinary retention, chest pain, abdominal pain, vomiting, nausea.  Past medical history includes PTSD, alcohol  abuse, thrombocytopenia, CVA, hypertension.  He does endorse drinking 6 beers per day currently.      Physical Exam   Triage Vital Signs: ED Triage Vitals [01/03/24 1559]  Encounter Vitals Group     BP 136/78     Girls Systolic BP Percentile      Girls Diastolic BP Percentile      Boys Systolic BP Percentile      Boys Diastolic BP Percentile      Pulse Rate 89     Resp 16     Temp 98.7 F (37.1 C)     Temp Source Oral     SpO2 95 %     Weight      Height      Head Circumference      Peak Flow      Pain Score 8     Pain Loc      Pain Education      Exclude from Growth Chart     Most recent vital signs: Vitals:   01/03/24 1559  BP: 136/78  Pulse: 89  Resp: 16  Temp: 98.7 F (37.1 C)  SpO2: 95%    General: Well-appearing, in no acute distress. Appears stated age. Head: Normocephalic, atraumatic. CV: Regular rate, 89 bpm. Dorsalis pedis pulses 2+ and symmetric.  Respiratory: Breath sounds clear b/l. No wheezes, rales, or rhonchi. No respiratory distress. Normal respiratory effort. GI: Soft, non-distended, non-tender. No rebound or  guarding. MSK: Grossly normal ROM and strength 5/5 in bilateral lower extremities.  Able to dorsiflex, plantarflex, extend his knee with ease. No obvious deformities or swelling.  No midline lumbar tenderness or paraspinal tenderness. Sensation intact for L4, L5, and S1. Positive straight leg raise on the left side, negative on the right. Ambulatory with assistance. Skin:Warm, dry, intact. No rashes, lesions, or ecchymosis. No cyanosis or pallor. Neurological: A&Ox4 to person, place, time, and situation.  Psychiatric: Mood and affect appropriate. Thought processes coherent.  No CVA tenderness bilaterally.   ED Results / Procedures / Treatments   Labs (all labs ordered are listed, but only abnormal results are displayed) Labs Reviewed - No data to display   EKG    RADIOLOGY CT lumbar spine ordered w/o contrast.  IMPRESSION: No acute fracture or malalignment of the lumbar spine. No high-grade spinal canal or neuroforaminal stenosis.   PROCEDURES:  Critical Care performed: No  Procedures   MEDICATIONS ORDERED IN ED: Medications  dexamethasone (DECADRON) injection 8 mg (8 mg Intramuscular Given 01/03/24 1707)     IMPRESSION / MDM / ASSESSMENT AND PLAN / ED COURSE  I reviewed the triage  vital signs and the nursing notes.                              Differential diagnosis includes, but is not limited to, lumbar radiculopathy/sciatica, musculoskeletal strain, SI joint dysfunction, cauda equina syndrome  Patient's presentation is most consistent with acute complicated illness / injury requiring diagnostic workup.  Patient is a 58 year old male, presentation most consistent with left-sided sciatica.  Patient has positive straight leg raise on the left side, numbing and tingling radiating from his back down his posterior aspect of his left leg.  No red flag symptoms of back pain.  CT lumbar spine without contrast was ordered.  I independently viewed the radiologist's report and  imaging, and I agree that there are no acute fractures or misalignments.  Vital signs all within normal range.  Patient was given dexamethasone 8 mg IM in the ED, reevaluation shows improvement in pain.  I will prescribe him a prednisone taper he can take to help with his acute pain.  We discussed his alcohol  use as well, he currently drinks 6 beers per day and is attempting to taper off of this amount.  His PCP does follow his care on this.  I instructed him not to take Tylenol  or ibuprofen  due to his alcohol  use with concerns for liver impairment or GI bleed.  We discussed returning to the emergency department if developing any red flag symptoms of back pain or he can follow-up with his PCP for any new or worsening symptoms.  Emergency department return precautions were discussed with the patient.  Patient is in agreement to the treatment plan.  Patient is stable for discharge.       FINAL CLINICAL IMPRESSION(S) / ED DIAGNOSES   Final diagnoses:  Sciatica of left side     Rx / DC Orders   ED Discharge Orders          Ordered    predniSONE (STERAPRED UNI-PAK 21 TAB) 10 MG (21) TBPK tablet        01/03/24 1726             Note:  This document was prepared using Dragon voice recognition software and may include unintentional dictation errors.    Thomasenia Flesher, PA-C 01/03/24 1741    Iver Marker, MD 01/08/24 [phone]

## 2024-01-03 NOTE — ED Triage Notes (Signed)
 C/O left leg pain overnight.  STates pain starts in back and shoots down leg.  STates felt some numbness to leg last night and was unable to walk due to pain.

## 2024-01-03 NOTE — Discharge Instructions (Signed)
 [name] lo evaluaron en Urgencias por dolor de espalda compatible con un nervio inflamado. Su evaluacin no sugiere anomalas agudas que requieran intervencin adicional en este momento. Por favor, recoja y tome la medicacin segn lo prescrito.  - Muvase segn lo tolere, pero evite levantar objetos pesados. No se recomienda el reposo en cama ni es el mejor tratamiento para la lumbalgia.  Por favor, consulte con su mdico de cabecera segn sea necesario o con cualquier otro profesional de la salud mencionado en esta documentacin. Si no tiene un mdico de cabecera, puede llamar a su compaa de seguros para encontrar uno. Si no tiene seguro, puede acudir al departamento de finanzas/registro para obtener ms ayuda.  Regrese a Urgencias inmediatamente si presenta alguno de los siguientes problemas: -- Prdida de [place] o dificultad para Geographical information systems officer; -- Incapacidad para controlar los intestinos; -- Aparicin de entumecimiento o debilidad en las piernas o entumecimiento entre las piernas; -- Incapacidad para caminar -- Ivan Hamilton  You were evaluated in the Emergency Department today for back pain consistent with an inflamed nerve. Your evaluation suggests no acute abnormalities which require further intervention at this time. Please pick up and take medication as prescribed.  - Move around as tolerated but avoiding heavy lifting. "Bed rest" is not recommended nor is it the best treatment for low back pain.  Please follow up with your primary care physician as needed or any other providers listed in this paperwork. If you do not have a primary doctor, you can call your insurance company to find one.  If you do not have insurance, you can go to the finance/registration department for more assistance.  Return to the ED immediately if you develop any of the following problems: -- Leaking urine or difficulty urinating; -- Inability to control your bowels; -- New numbness or weakness in your legs or numbness between  your legs; -- Inability to walk -- Fever

## 2024-01-13 DIAGNOSIS — R7309 Other abnormal glucose: Secondary | ICD-10-CM | POA: Diagnosis not present

## 2024-01-13 DIAGNOSIS — F1024 Alcohol dependence with alcohol-induced mood disorder: Secondary | ICD-10-CM | POA: Diagnosis not present

## 2024-01-13 DIAGNOSIS — F411 Generalized anxiety disorder: Secondary | ICD-10-CM | POA: Diagnosis not present

## 2024-01-13 DIAGNOSIS — E538 Deficiency of other specified B group vitamins: Secondary | ICD-10-CM | POA: Diagnosis not present

## 2024-01-13 DIAGNOSIS — F1020 Alcohol dependence, uncomplicated: Secondary | ICD-10-CM | POA: Diagnosis not present

## 2024-01-13 DIAGNOSIS — M5440 Lumbago with sciatica, unspecified side: Secondary | ICD-10-CM | POA: Diagnosis not present

## 2024-01-13 DIAGNOSIS — Z125 Encounter for screening for malignant neoplasm of prostate: Secondary | ICD-10-CM | POA: Diagnosis not present

## 2024-01-13 DIAGNOSIS — Z6830 Body mass index (BMI) 30.0-30.9, adult: Secondary | ICD-10-CM | POA: Diagnosis not present

## 2024-01-13 DIAGNOSIS — G8929 Other chronic pain: Secondary | ICD-10-CM | POA: Diagnosis not present

## 2024-01-13 DIAGNOSIS — M79605 Pain in left leg: Secondary | ICD-10-CM | POA: Diagnosis not present

## 2024-01-13 DIAGNOSIS — E039 Hypothyroidism, unspecified: Secondary | ICD-10-CM | POA: Diagnosis not present

## 2024-04-24 NOTE — Progress Notes (Signed)
 No chief complaint on file.   HPI  Ivan Hamilton is a 58 y.o. here for an acute issue.   He has a PMH of alcohol  abuse/cirrhosis, CVA, hypothyroidism, B12 deficiency, who presents with concerns of hallucinations, audible and visual yesterday. He denies recent etoh use and doesn't think it is withdrawal.  He describes being under a lot of stress.  He is under court order to not be around his wife.  Currently staying alone in an apartment.  Has not been taking fluoxetine  or Seroquel .  Takes occasional trazodone .  No congestion or fevers.  Has some general malaise and fatigue.    ROS  Pertinent items are noted in HPI.  Outpatient Encounter Medications as of 04/24/2024  Medication Sig Dispense Refill  . cyanocobalamin  (VITAMIN B12) 1000 MCG tablet Take 1 tablet (1,000 mcg total) by mouth once daily (Patient not taking: Reported on 01/13/2024) 90 tablet 1  . FLUoxetine  (PROZAC ) 40 MG capsule Take 1 capsule (40 mg total) by mouth once daily 90 capsule 1  . gabapentin  (NEURONTIN ) 800 MG tablet Take 1 tablet (800 mg total) by mouth 3 (three) times daily 270 tablet 1  . hydrOXYzine  (ATARAX ) 25 MG tablet TAKE 1 TABLET BY MOUTH 2 TIMES DAILY AS NEEDED FOR ITCHING. 60 tablet 2  . hydrOXYzine  (ATARAX ) 25 MG tablet Take 1 tablet (25 mg total) by mouth 3 (three) times daily as needed for Itching for up to 20 doses (Patient not taking: Reported on 01/13/2024) 20 tablet 0  . levothyroxine  (SYNTHROID ) 137 MCG tablet Take 1 tablet (137 mcg total) by mouth every Monday through Friday for 180 days Take on an empty stomach with a glass of water at least 30-60 minutes before breakfast.Discontinue Levothyroxine  125 microgram. 90 tablet 1  . QUEtiapine  (SEROQUEL ) 200 MG tablet Take 1 tablet (200 mg total) by mouth at bedtime 90 tablet 1  . traZODone  (DESYREL ) 150 MG tablet Take 1 tablet (150 mg total) by mouth at bedtime as needed for up to 180 days 90 tablet 1   No facility-administered encounter medications on file  as of 04/24/2024.    Allergies as of 04/24/2024  . (No Known Allergies)    Past Medical History:  Diagnosis Date  . 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 Alcohol  use disorder, severe, dependence (CMS/HHS-HCC)   . Alcohol -induced mood disorder (CMS/HHS-HCC) 08/17/2015  . Allergic state   . Hypothyroidism   . Stroke (CMS/HHS-HCC)   . Vitamin B12 deficiency 08/17/2015   On 08/11/2015 vitamin B12 was  Lab Results Component Value Date  VITB12 215 (L) 08/11/2015  VITB12 401 03/24/2015  VITB12 421 08/31/2014  supporting low normal but concerning given presence of symptom(s).  Notably, his most recent folic acid  level was  Lab Results Component Value Date  FOLATE 10.4 08/11/2015 . Plan: in addition to checking his folate &/or starting/continuing folic acid  1 mg po/VT/i    Past Surgical History:  Procedure Laterality Date  . COLONOSCOPY  03/22/2021   Tubular adenoma/Repeat 62yrs/TKT  . EGD  03/22/2021   Esophageal varices/Normal EGD biopsy/No repeat/TKT    Vitals:   04/24/24 1445  BP: (!) 140/80  Pulse: 110    Physical Exam  General. Well appearing; NAD; VS reviewed     HEENT: Sclera and conjunctiva injected; EOMI,  Neck. Supple. No thyromegaly, lymphadenopathy. Lungs. Respirations unlabored; clear to auscultation bilaterally. Cardiovascular. Heart regular rate and rhythm without murmurs, gallops, or rubs. Abdomen:  Soft, non tender.  Normoactive bowel sounds.   Extremities:  No  edema. Skin. Normal color and turgor Neurologic. Alert and oriented x3   Assessment and Plan 58 year old with above PMH who reports episode of hallucinations yesterday, audible and visual.  Reports being under stress.  Denies recent alcohol  and does not think symptoms related to withdrawal.  Further evaluate with labs.  He has not been taking fluoxetine  or Seroquel .  Restart fluoxetine  today.  Consult psychiatry.    1. GAD (generalized anxiety disorder)  -     FLUoxetine  (PROZAC ) 40 MG capsule;  Take 1 capsule (40 mg total) by mouth once daily -     Comprehensive Metabolic Panel (CMP) -     Thyroid  Stimulating-Hormone (TSH) -     Ambulatory Referral to Adult Behavioral Health  2. Auditory hallucinations  -     FLUoxetine  (PROZAC ) 40 MG capsule; Take 1 capsule (40 mg total) by mouth once daily -     Comprehensive Metabolic Panel (CMP) -     Thyroid  Stimulating-Hormone (TSH) -     Ambulatory Referral to Adult Behavioral Health  3. Alcohol  dependence with alcohol -induced mood disorder (CMS-HCC)  -     Ambulatory Referral to Adult Behavioral Health  4. Major psychotic depression, recurrent (CMS/HHS-HCC)  -     Ambulatory Referral to Adult Behavioral Health    I have personally performed this service.  [address] [place], [place]

## 2024-04-27 NOTE — Progress Notes (Signed)
 ENCOUNTER: Patient Class :No patient class for patient encounter Department: [facility] [address] [place] [address]  PATIENT: Patient Demographics      Name Patient ID SSN Gender Identity Birth Date   Ivan Hamilton, Ivan Hamilton [id number] Male Jul 01, 2066 (57 yrs)          Address Phone Email       [address] [place] [address] [phone] BENNIE) [email or web address]            [facility] Other             Reg Status PCP Date Last Verified Next Review Date     Verified Sadie Tamra Cal, [phone] 04/24/24 05/24/24           Marital Status Religion Language       Single Non-Denominational Spanish              EMERGENCY CONTACT: Name Relationship Lgl Grd Work Marine scientist Phone  1. Stargell,BLAN* Other   [phone]   2. YUKIO, BISPING Son or Daughter   [phone]    GUARANTOR: There is no guarantor information entered for this encounter.  COVERAGE: Primary Visit Coverage      Payer Plan Group Number Group Name Payer Phone Plan Phone   No coverage found                Secondary Visit Coverage      Payer Plan Group Number Group Name Payer Phone Plan Phone   No coverage found                Primary Coverage      Payer Plan Group Number Group Name Payer Phone Plan Phone   [facility] [employer] MDR ADV NOGRP   [phone]           Primary Subscriber      Subscriber ID Subscriber Name Subscriber [facility] Subscriber Address   [id number] Ivan Hamilton,Ivan Hamilton [id number] [address]      [place], [place] [address]           Secondary Coverage      Payer Plan Group Number Group Name Payer Phone Plan Phone   No coverage found

## 2024-04-30 DIAGNOSIS — M5442 Lumbago with sciatica, left side: Secondary | ICD-10-CM | POA: Diagnosis not present

## 2024-04-30 DIAGNOSIS — E039 Hypothyroidism, unspecified: Secondary | ICD-10-CM | POA: Diagnosis not present

## 2024-04-30 DIAGNOSIS — E538 Deficiency of other specified B group vitamins: Secondary | ICD-10-CM | POA: Diagnosis not present

## 2024-04-30 DIAGNOSIS — R440 Auditory hallucinations: Secondary | ICD-10-CM | POA: Diagnosis not present

## 2024-04-30 DIAGNOSIS — F331 Major depressive disorder, recurrent, moderate: Secondary | ICD-10-CM | POA: Diagnosis not present

## 2024-04-30 DIAGNOSIS — F1024 Alcohol dependence with alcohol-induced mood disorder: Secondary | ICD-10-CM | POA: Diagnosis not present

## 2024-04-30 DIAGNOSIS — R0989 Other specified symptoms and signs involving the circulatory and respiratory systems: Secondary | ICD-10-CM | POA: Diagnosis not present

## 2024-04-30 DIAGNOSIS — Z23 Encounter for immunization: Secondary | ICD-10-CM | POA: Diagnosis not present

## 2024-04-30 DIAGNOSIS — Z6830 Body mass index (BMI) 30.0-30.9, adult: Secondary | ICD-10-CM | POA: Diagnosis not present

## 2024-04-30 DIAGNOSIS — R0683 Snoring: Secondary | ICD-10-CM | POA: Diagnosis not present

## 2024-04-30 DIAGNOSIS — F419 Anxiety disorder, unspecified: Secondary | ICD-10-CM | POA: Diagnosis not present

## 2024-04-30 DIAGNOSIS — Z0000 Encounter for general adult medical examination without abnormal findings: Secondary | ICD-10-CM | POA: Diagnosis not present

## 2024-04-30 DIAGNOSIS — M79605 Pain in left leg: Secondary | ICD-10-CM | POA: Diagnosis not present

## 2024-05-29 ENCOUNTER — Other Ambulatory Visit: Payer: Self-pay | Admitting: Internal Medicine

## 2024-05-29 DIAGNOSIS — F5104 Psychophysiologic insomnia: Secondary | ICD-10-CM

## 2024-05-29 DIAGNOSIS — R440 Auditory hallucinations: Secondary | ICD-10-CM

## 2024-05-29 DIAGNOSIS — R519 Headache, unspecified: Secondary | ICD-10-CM

## 2024-06-04 ENCOUNTER — Other Ambulatory Visit

## 2024-06-04 ENCOUNTER — Other Ambulatory Visit: Payer: Self-pay | Admitting: Internal Medicine

## 2024-06-04 DIAGNOSIS — F5104 Psychophysiologic insomnia: Secondary | ICD-10-CM

## 2024-06-04 DIAGNOSIS — R440 Auditory hallucinations: Secondary | ICD-10-CM

## 2024-06-04 DIAGNOSIS — R519 Headache, unspecified: Secondary | ICD-10-CM

## 2024-06-16 ENCOUNTER — Ambulatory Visit

## 2024-08-03 ENCOUNTER — Other Ambulatory Visit: Payer: Self-pay | Admitting: Internal Medicine

## 2024-08-03 DIAGNOSIS — G8929 Other chronic pain: Secondary | ICD-10-CM

## 2024-08-03 DIAGNOSIS — R519 Headache, unspecified: Secondary | ICD-10-CM

## 2024-08-03 DIAGNOSIS — E039 Hypothyroidism, unspecified: Secondary | ICD-10-CM

## 2024-08-03 DIAGNOSIS — Z9182 Personal history of military deployment: Secondary | ICD-10-CM

## 2024-08-03 DIAGNOSIS — F1020 Alcohol dependence, uncomplicated: Secondary | ICD-10-CM

## 2024-08-05 ENCOUNTER — Inpatient Hospital Stay
Admission: RE | Admit: 2024-08-05 | Discharge: 2024-08-05 | Disposition: A | Source: Ambulatory Visit | Attending: Internal Medicine | Admitting: Internal Medicine

## 2024-08-05 DIAGNOSIS — G8929 Other chronic pain: Secondary | ICD-10-CM

## 2024-08-05 DIAGNOSIS — R519 Headache, unspecified: Secondary | ICD-10-CM

## 2024-08-05 DIAGNOSIS — F4312 Post-traumatic stress disorder, chronic: Secondary | ICD-10-CM

## 2024-08-05 DIAGNOSIS — F1020 Alcohol dependence, uncomplicated: Secondary | ICD-10-CM

## 2024-08-05 DIAGNOSIS — E039 Hypothyroidism, unspecified: Secondary | ICD-10-CM

## 2024-08-13 ENCOUNTER — Other Ambulatory Visit (INDEPENDENT_AMBULATORY_CARE_PROVIDER_SITE_OTHER): Payer: Self-pay | Admitting: Nurse Practitioner

## 2024-08-13 DIAGNOSIS — I8002 Phlebitis and thrombophlebitis of superficial vessels of left lower extremity: Secondary | ICD-10-CM

## 2024-08-13 DIAGNOSIS — I83812 Varicose veins of left lower extremities with pain: Secondary | ICD-10-CM

## 2024-08-17 ENCOUNTER — Ambulatory Visit (INDEPENDENT_AMBULATORY_CARE_PROVIDER_SITE_OTHER): Admitting: Nurse Practitioner

## 2024-08-17 ENCOUNTER — Encounter (INDEPENDENT_AMBULATORY_CARE_PROVIDER_SITE_OTHER)

## 2024-09-10 ENCOUNTER — Ambulatory Visit (INDEPENDENT_AMBULATORY_CARE_PROVIDER_SITE_OTHER): Admitting: Nurse Practitioner

## 2024-09-10 ENCOUNTER — Encounter (INDEPENDENT_AMBULATORY_CARE_PROVIDER_SITE_OTHER)
